# Patient Record
Sex: Female | Born: 1935 | Race: White | Hispanic: No | Smoking: Never smoker
Health system: Southern US, Community
[De-identification: ages and names within clinical notes are randomized; demographics above are authoritative.]

## PROBLEM LIST (undated history)

## (undated) DIAGNOSIS — E039 Hypothyroidism, unspecified: Secondary | ICD-10-CM

## (undated) DIAGNOSIS — G629 Polyneuropathy, unspecified: Secondary | ICD-10-CM

## (undated) DIAGNOSIS — I10 Essential (primary) hypertension: Secondary | ICD-10-CM

## (undated) DIAGNOSIS — I872 Venous insufficiency (chronic) (peripheral): Secondary | ICD-10-CM

## (undated) DIAGNOSIS — K219 Gastro-esophageal reflux disease without esophagitis: Secondary | ICD-10-CM

## (undated) DIAGNOSIS — M8580 Other specified disorders of bone density and structure, unspecified site: Secondary | ICD-10-CM

## (undated) DIAGNOSIS — F419 Anxiety disorder, unspecified: Secondary | ICD-10-CM

## (undated) DIAGNOSIS — R7881 Bacteremia: Secondary | ICD-10-CM

## (undated) DIAGNOSIS — I447 Left bundle-branch block, unspecified: Secondary | ICD-10-CM

## (undated) HISTORY — DX: Hypothyroidism, unspecified: E03.9

## (undated) HISTORY — PX: BLADDER SURGERY: SHX569

## (undated) HISTORY — PX: ABDOMINAL HYSTERECTOMY: SHX81

---
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
## 1997-12-07 ENCOUNTER — Other Ambulatory Visit: Admission: RE | Admit: 1997-12-07 | Discharge: 1997-12-07 | Payer: Self-pay | Admitting: Geriatric Medicine

## 1999-07-13 ENCOUNTER — Other Ambulatory Visit: Admission: RE | Admit: 1999-07-13 | Discharge: 1999-07-13 | Payer: Self-pay | Admitting: Obstetrics and Gynecology

## 2001-03-12 ENCOUNTER — Ambulatory Visit (HOSPITAL_COMMUNITY): Admission: RE | Admit: 2001-03-12 | Discharge: 2001-03-12 | Payer: Self-pay | Admitting: Gastroenterology

## 2001-05-22 ENCOUNTER — Ambulatory Visit (HOSPITAL_COMMUNITY): Admission: RE | Admit: 2001-05-22 | Discharge: 2001-05-22 | Payer: Self-pay | Admitting: Geriatric Medicine

## 2001-05-22 ENCOUNTER — Encounter: Payer: Self-pay | Admitting: Geriatric Medicine

## 2003-01-27 ENCOUNTER — Other Ambulatory Visit: Admission: RE | Admit: 2003-01-27 | Discharge: 2003-01-27 | Payer: Self-pay | Admitting: Obstetrics & Gynecology

## 2003-05-19 ENCOUNTER — Other Ambulatory Visit: Admission: RE | Admit: 2003-05-19 | Discharge: 2003-05-19 | Payer: Self-pay | Admitting: Obstetrics & Gynecology

## 2003-08-24 ENCOUNTER — Other Ambulatory Visit: Admission: RE | Admit: 2003-08-24 | Discharge: 2003-08-24 | Payer: Self-pay | Admitting: Obstetrics & Gynecology

## 2004-03-03 ENCOUNTER — Other Ambulatory Visit: Admission: RE | Admit: 2004-03-03 | Discharge: 2004-03-03 | Payer: Self-pay | Admitting: Obstetrics & Gynecology

## 2004-09-06 ENCOUNTER — Other Ambulatory Visit: Admission: RE | Admit: 2004-09-06 | Discharge: 2004-09-06 | Payer: Self-pay | Admitting: Obstetrics & Gynecology

## 2005-04-06 ENCOUNTER — Other Ambulatory Visit: Admission: RE | Admit: 2005-04-06 | Discharge: 2005-04-06 | Payer: Self-pay | Admitting: Obstetrics & Gynecology

## 2005-08-03 ENCOUNTER — Ambulatory Visit (HOSPITAL_COMMUNITY): Admission: RE | Admit: 2005-08-03 | Discharge: 2005-08-03 | Payer: Self-pay | Admitting: Interventional Cardiology

## 2006-07-02 ENCOUNTER — Encounter: Admission: RE | Admit: 2006-07-02 | Discharge: 2006-07-02 | Payer: Self-pay | Admitting: Geriatric Medicine

## 2006-07-02 IMAGING — US US ABDOMEN COMPLETE
1 series · 6 of 6 positions shown · non-contrast
Comparison: None.

ABDOMEN ULTRASOUND:

CLINICAL DATA: Right upper quadrant pain
TECHNIQUE: Complete abdominal ultrasound examination was performed including
evaluation of the liver, gallbladder, bile ducts, pancreas, kidneys, spleen,
IVC, and abdominal aorta.

[Series 1: us abdomen complete · 6 of 6 slices shown]
[im 1/6]
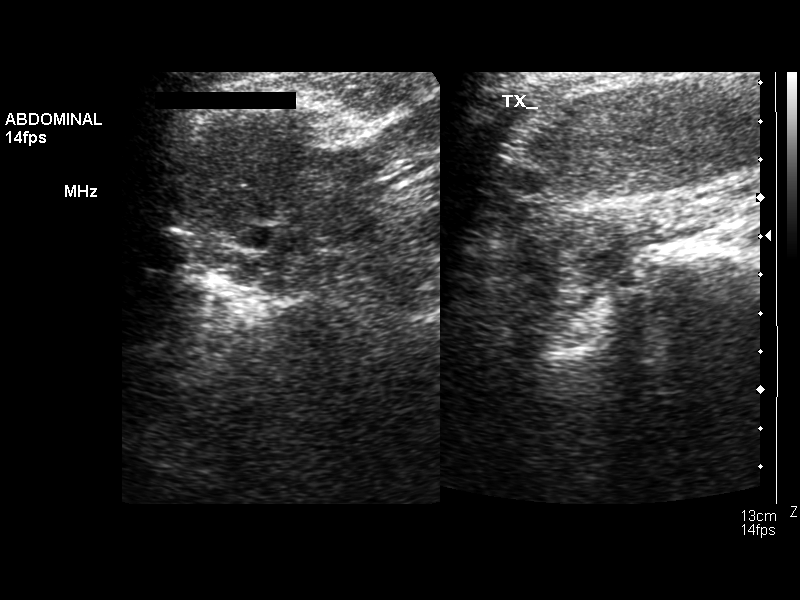
[im 2/6]
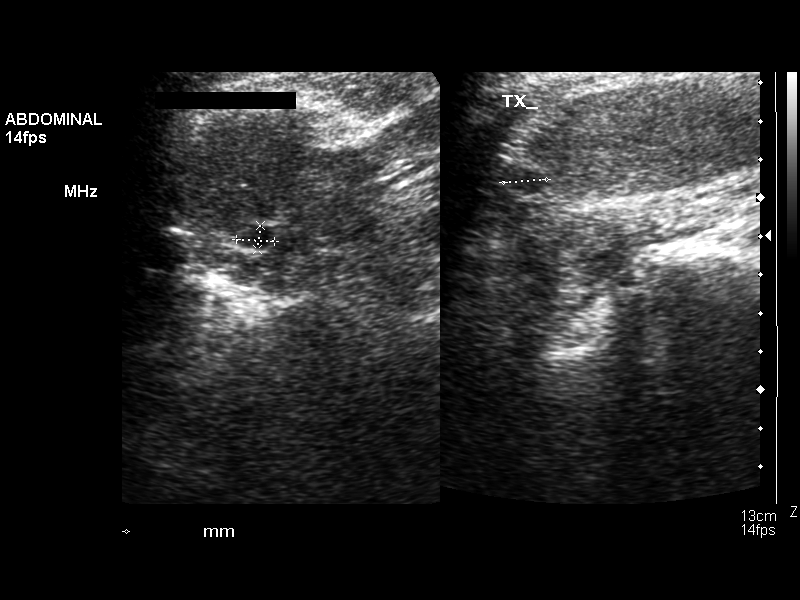
[im 3/6]
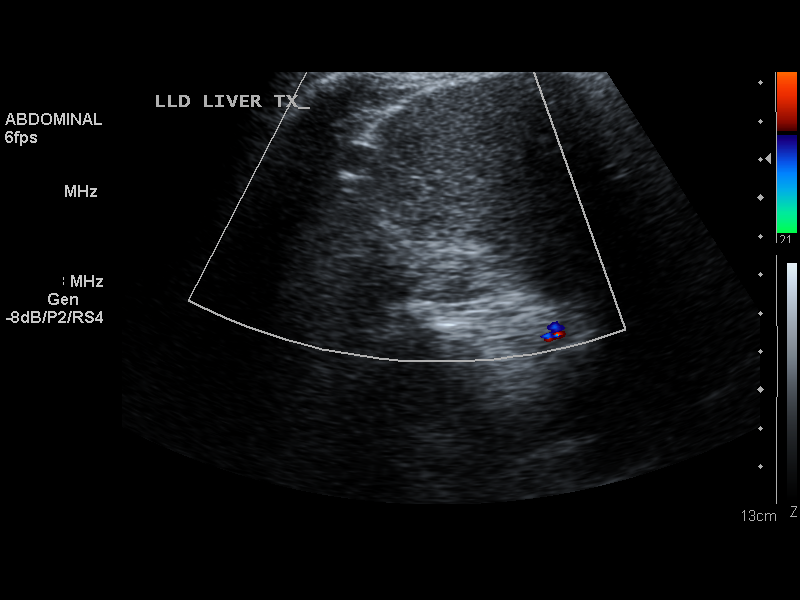
[im 4/6]
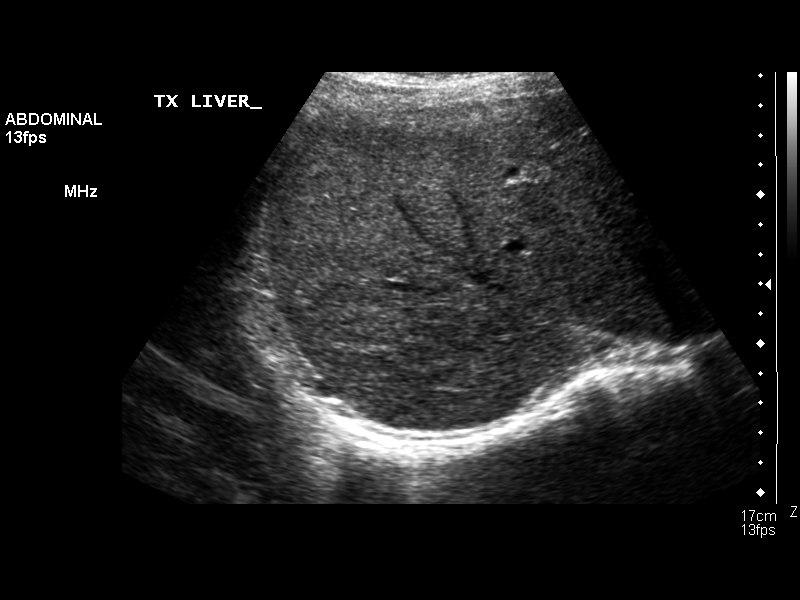
[im 5/6]
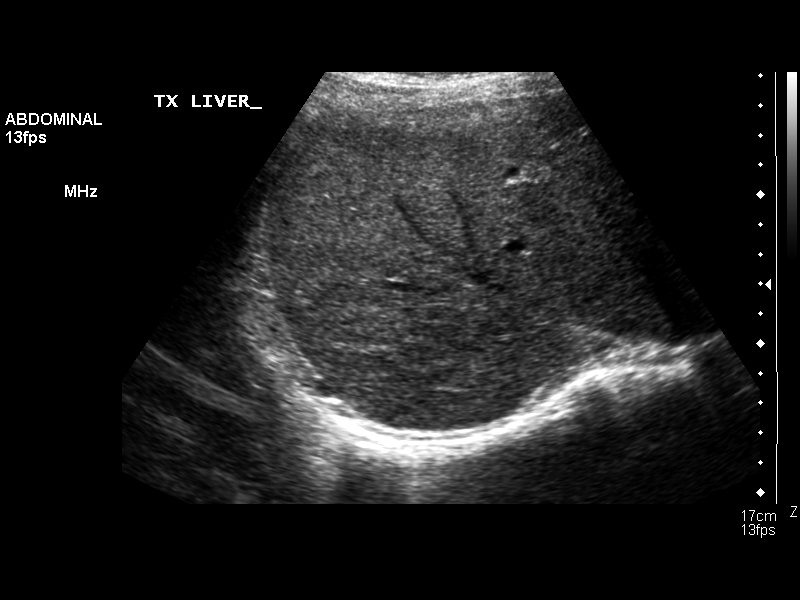
[im 6/6]
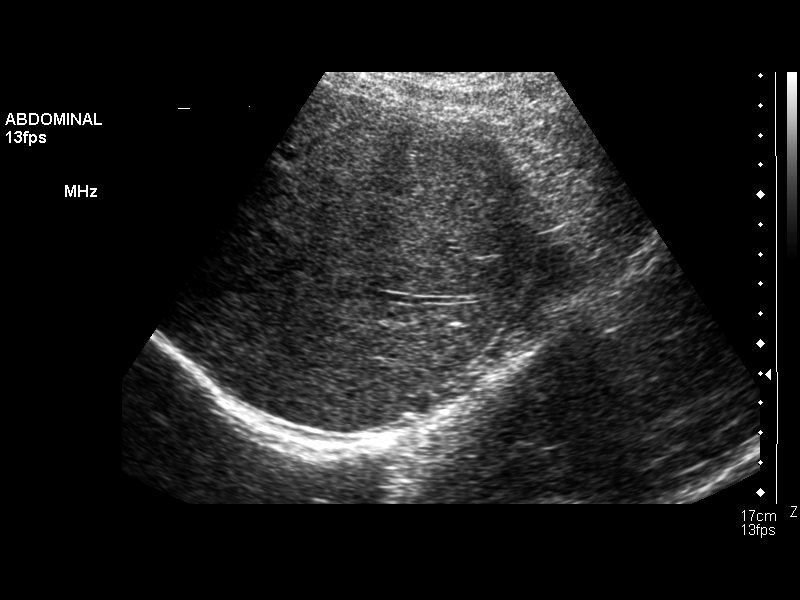

[6 of 6 positions shown; findings below may reference images not displayed]

FINDINGS: Gallbladder: Normal. Specifically, there is no evidence for gallstones,
gallbladder wall thickening or pericholecystic fluid.

Common Bile Duct:  Nondilated

Liver:  Tiny 1 cm subcapsular cyst. Otherwise unremarkable.

Inferior Vena Cava:  Normal

Pancreas:  Normal

Spleen:  Normal

Right Kidney:  11.0 cm in long axis.  Mild fullness of the intrarenal collecting
system.

Left Kidney:  12.7 cm in long axis.  4.8 cm cyst is identified in the upper pole
of the right kidney.

Aorta:  No aneurysm
IMPRESSION: Tiny hepatic cyst.

Mild fullness of the right intrarenal collecting system with a 4.8 cm cyst in
the upper pole of left kidney.

No evidence for cholelithiasis.

## 2006-07-02 IMAGING — US US ABDOMEN COMPLETE
1 series · 14 of 25 positions shown · non-contrast
Comparison: None.

ABDOMEN ULTRASOUND:

CLINICAL DATA: Right upper quadrant pain
TECHNIQUE: Complete abdominal ultrasound examination was performed including
evaluation of the liver, gallbladder, bile ducts, pancreas, kidneys, spleen,
IVC, and abdominal aorta.

[Series 1: us abdomen complete · 0.29mm/px · 14 of 76 slices shown]
[im 1/76]
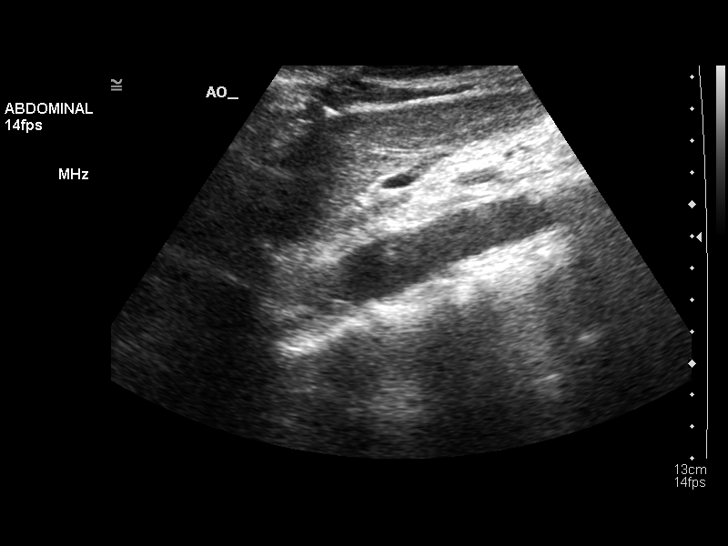
[im 7/76]
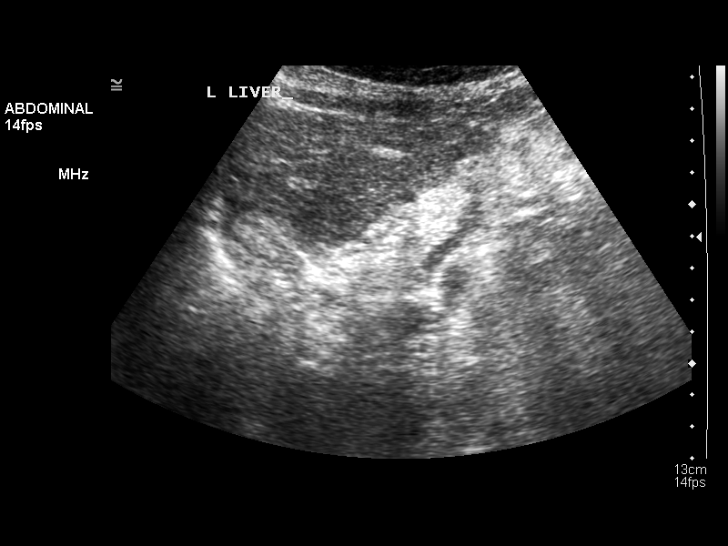
[im 13/76]
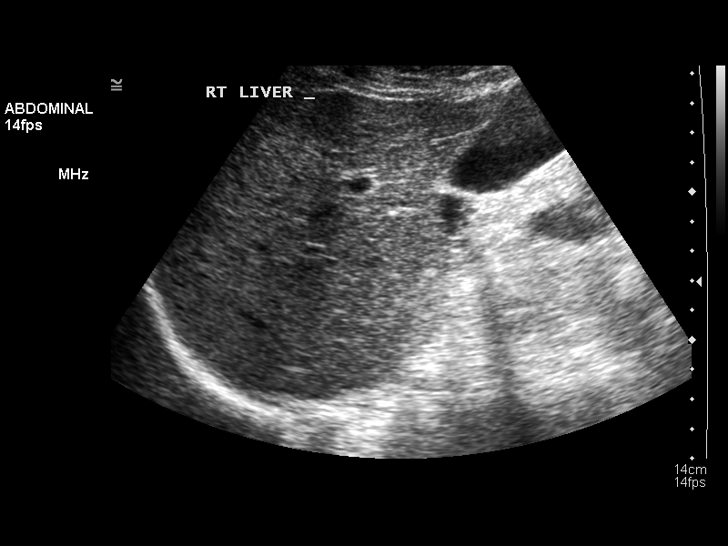
[im 19/76]
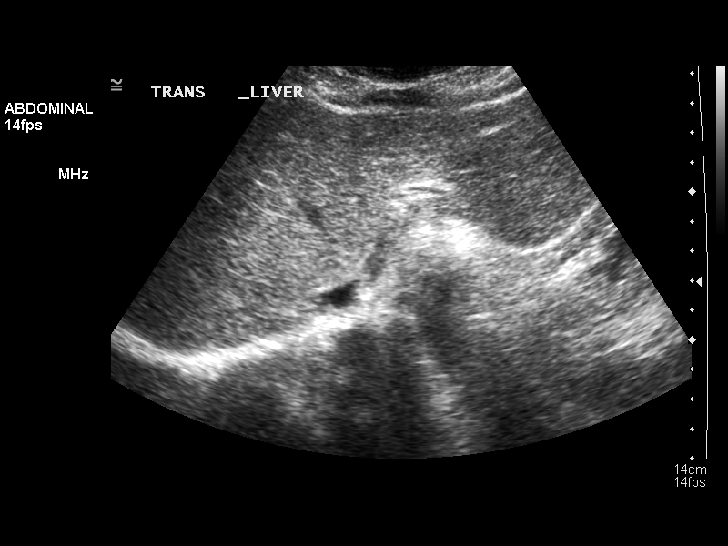
[im 26/76]
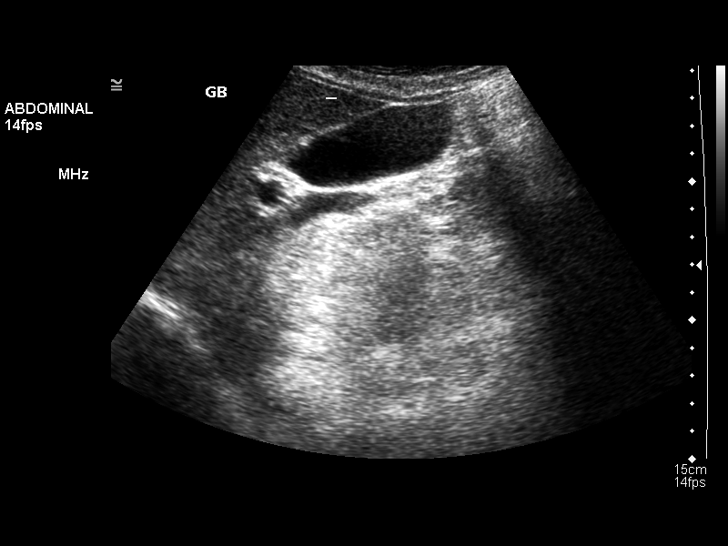
[im 29/76]
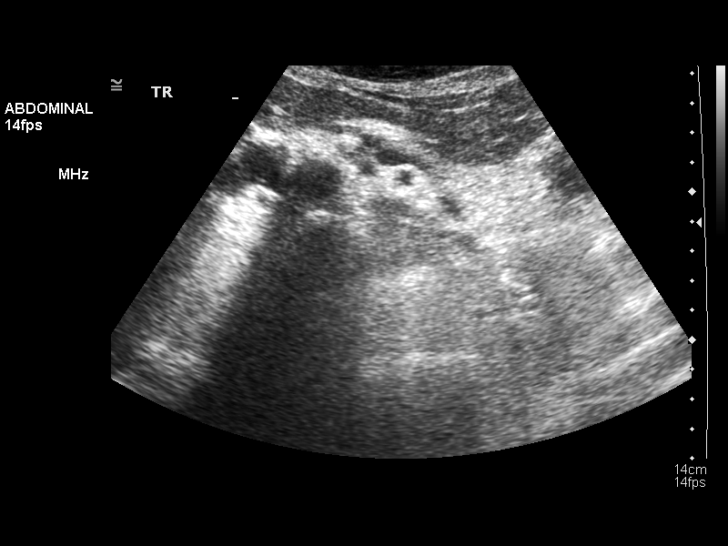
[im 35/76]
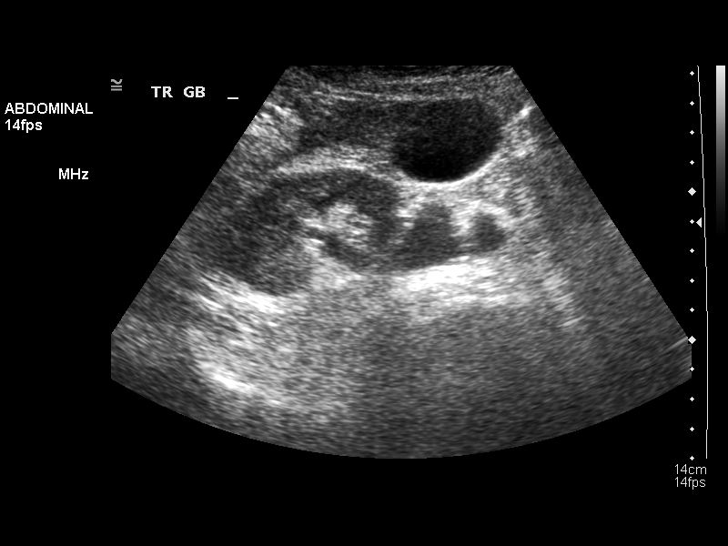
[im 41/76]
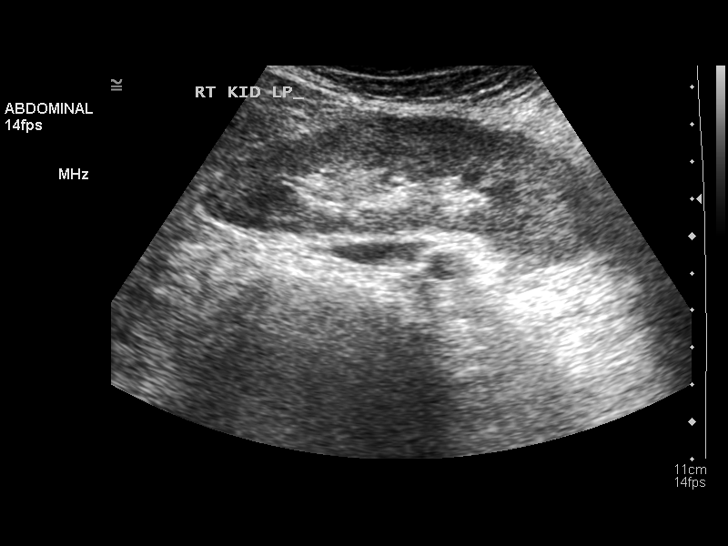
[im 47/76]
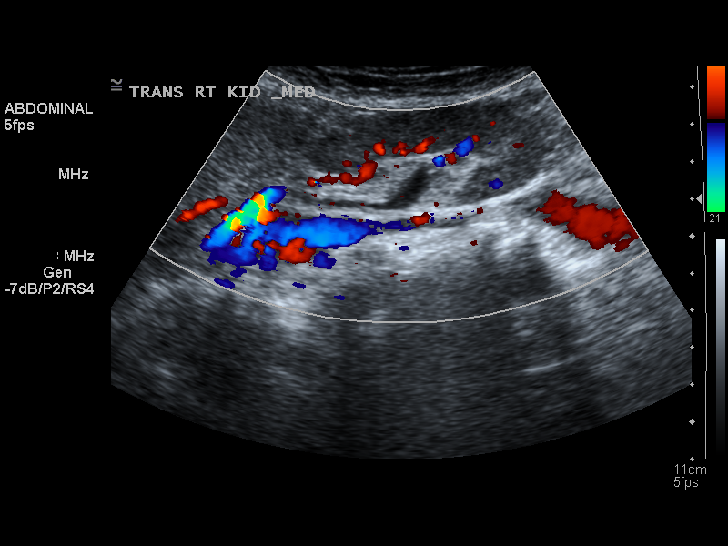
[im 51/76]
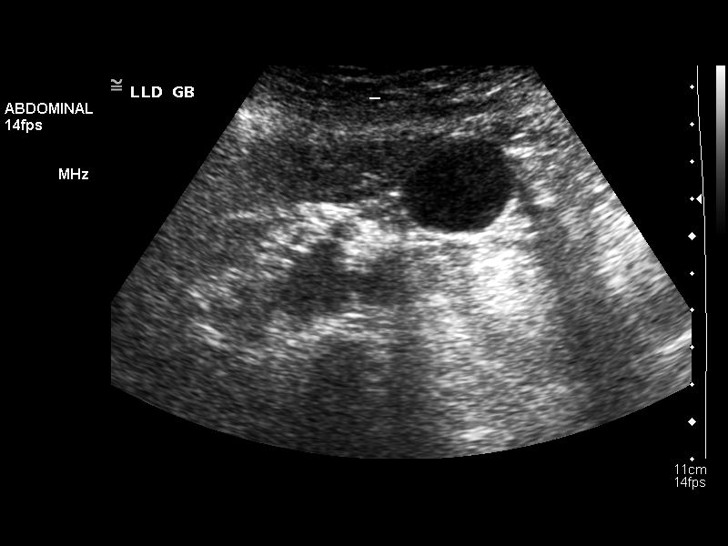
[im 57/76]
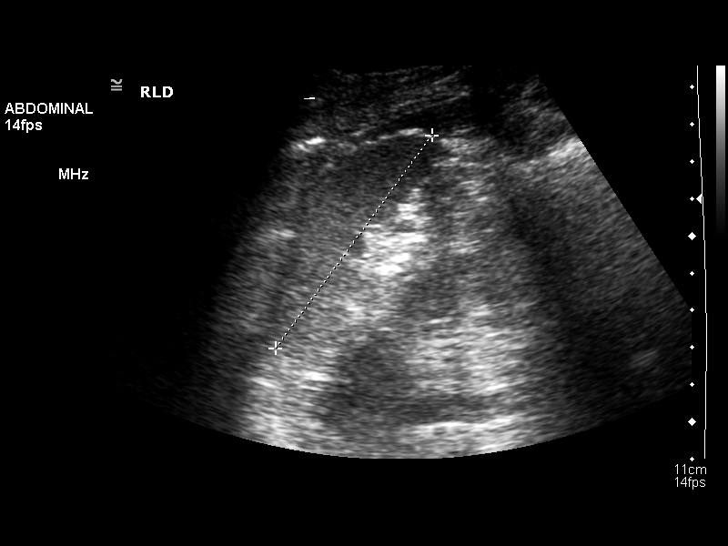
[im 63/76]
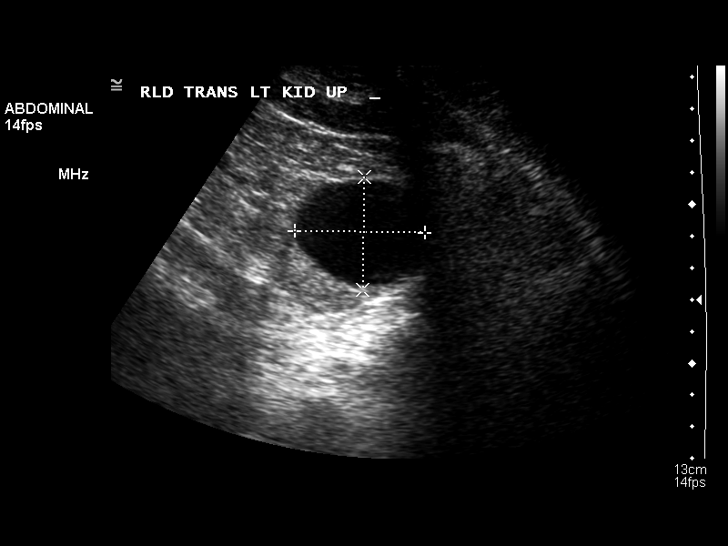
[im 69/76]
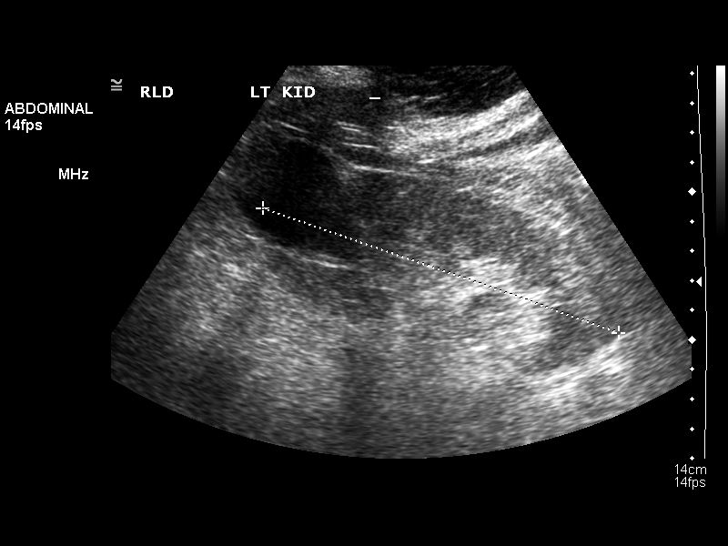
[im 76/76]
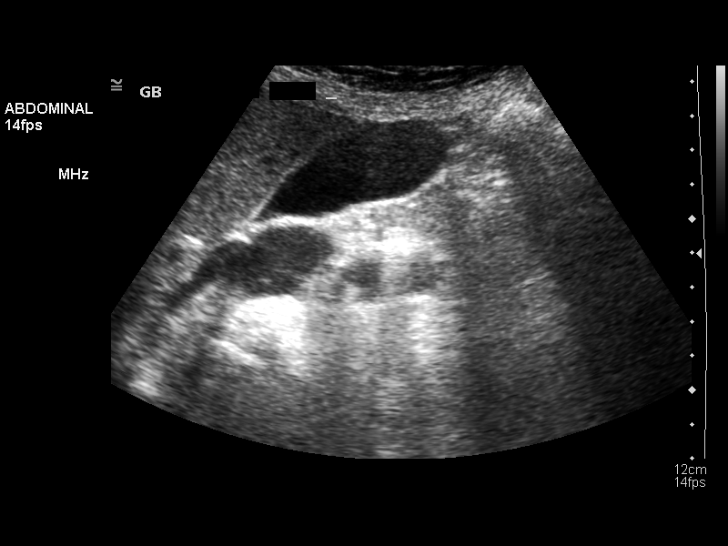

[14 of 25 positions shown; findings below may reference images not displayed]

FINDINGS: Gallbladder: Normal. Specifically, there is no evidence for gallstones,
gallbladder wall thickening or pericholecystic fluid.

Common Bile Duct:  Nondilated

Liver:  Tiny 1 cm subcapsular cyst. Otherwise unremarkable.

Inferior Vena Cava:  Normal

Pancreas:  Normal

Spleen:  Normal

Right Kidney:  11.0 cm in long axis.  Mild fullness of the intrarenal collecting
system.

Left Kidney:  12.7 cm in long axis.  4.8 cm cyst is identified in the upper pole
of the right kidney.

Aorta:  No aneurysm
IMPRESSION: Tiny hepatic cyst.

Mild fullness of the right intrarenal collecting system with a 4.8 cm cyst in
the upper pole of left kidney.

No evidence for cholelithiasis.

## 2008-09-09 ENCOUNTER — Encounter: Admission: RE | Admit: 2008-09-09 | Discharge: 2008-09-09 | Payer: Self-pay | Admitting: Geriatric Medicine

## 2008-09-09 IMAGING — CT CT PELVIS W/ CM
3 of 5 series · 13 of 36 positions shown, 19 images · IV contrast (READICAT/WATER & [ID] OMNI 300)
Comparison: None.

CT ABDOMEN

CLINICAL DATA: Abdominal pain and bloating.

CT ABDOMEN AND PELVIS WITH CONTRAST
TECHNIQUE: Multidetector CT imaging of the abdomen and pelvis was
performed using the standard protocol following bolus
administration of intravenous contrast.
Contrast: 100 ml [0K]

[Series 3: routine abdomen · axial · 0.88mm/px · z∈[-279,+1]mm · 6 of 80 slices shown, 11 images]
[im 12/80  soft-tissue]
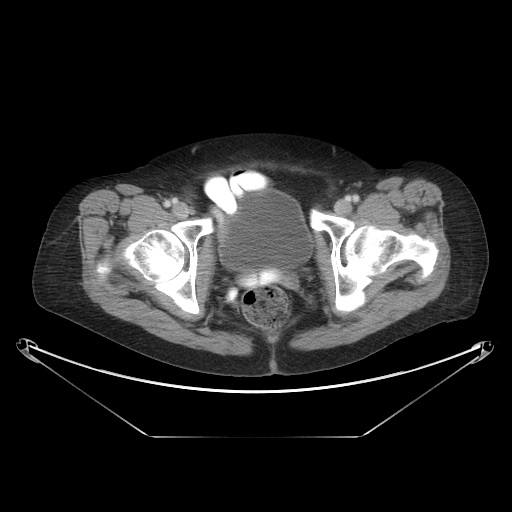
[im 12/80  bone]
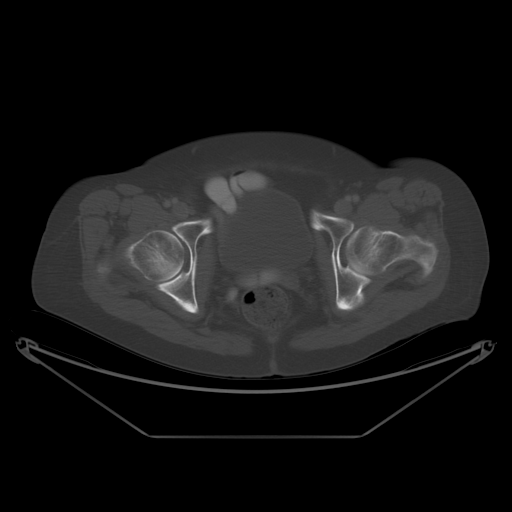
[im 23/80  soft-tissue]
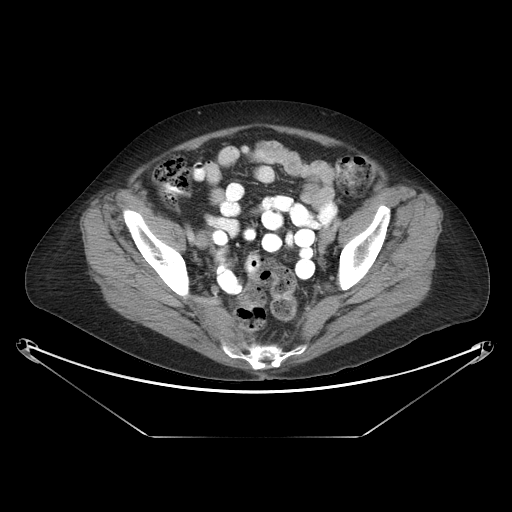
[im 34/80  soft-tissue]
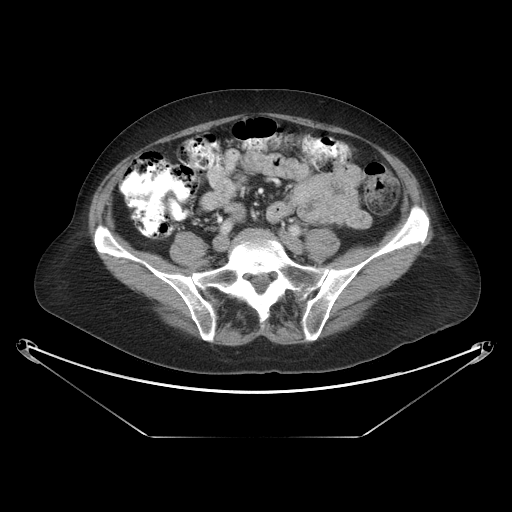
[im 34/80  lung]
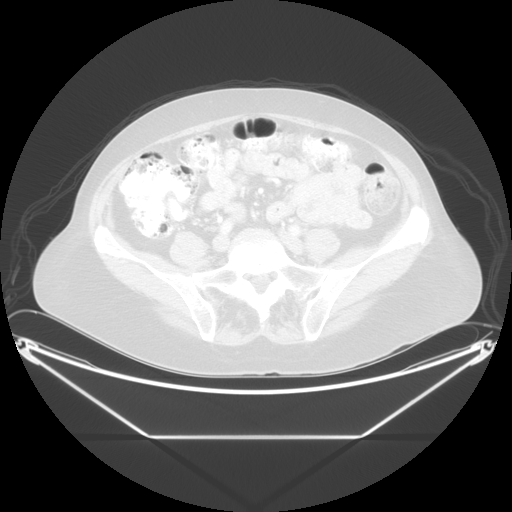
[im 46/80  soft-tissue]
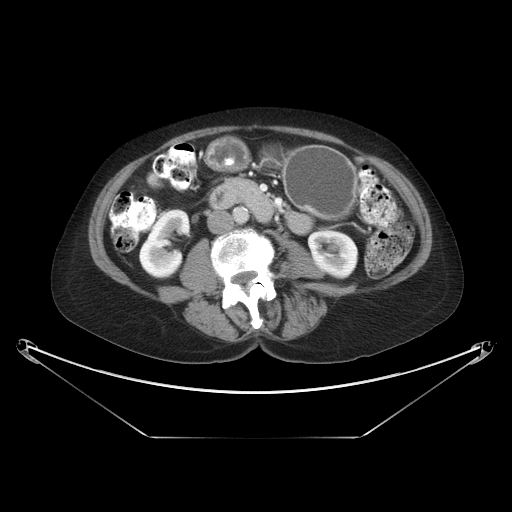
[im 46/80  lung]
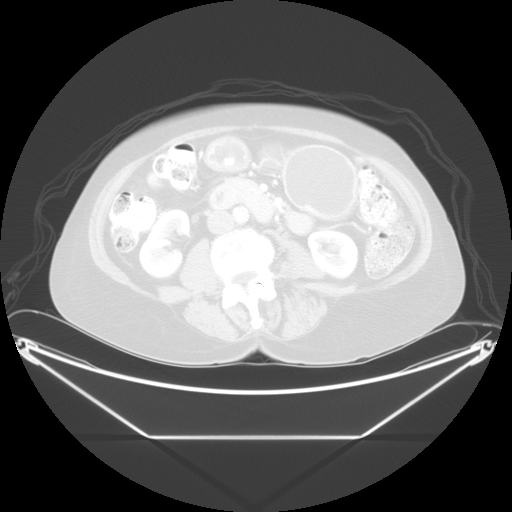
[im 57/80  soft-tissue]
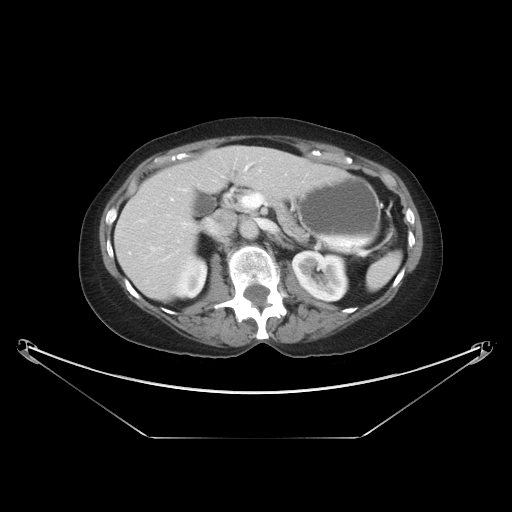
[im 57/80  lung]
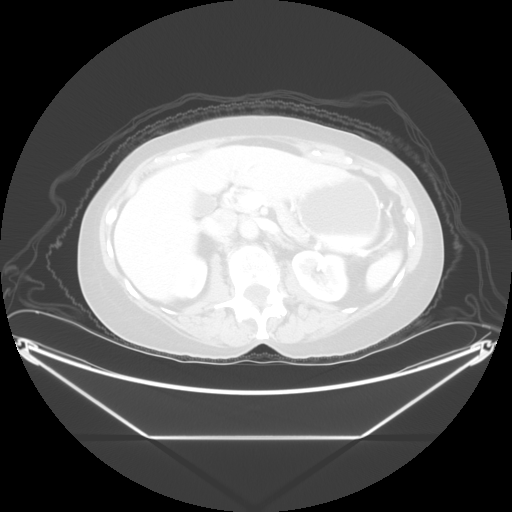
[im 68/80  soft-tissue]
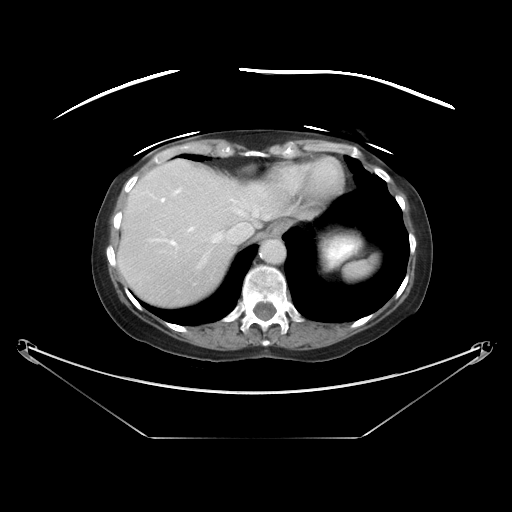
[im 68/80  lung]
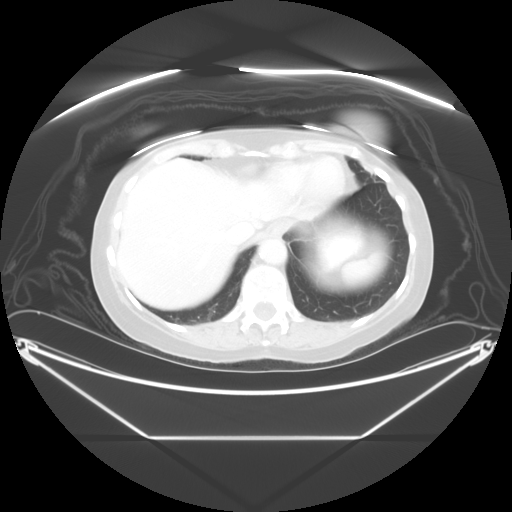

[Series 601: coronal body · coronal · 0.88mm/px · 1 of 125 slices shown, 2 images]
[im 42/125  soft-tissue]
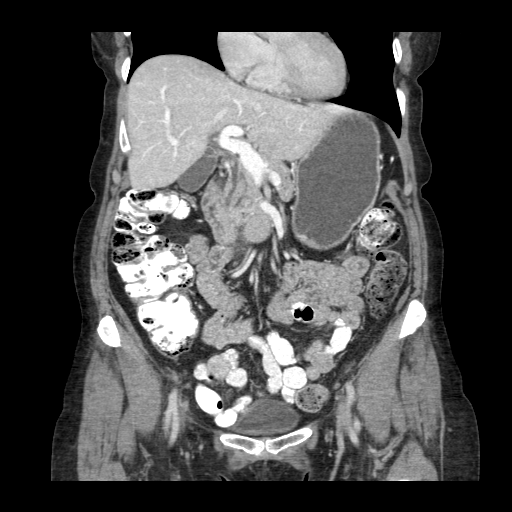
[im 42/125  bone]
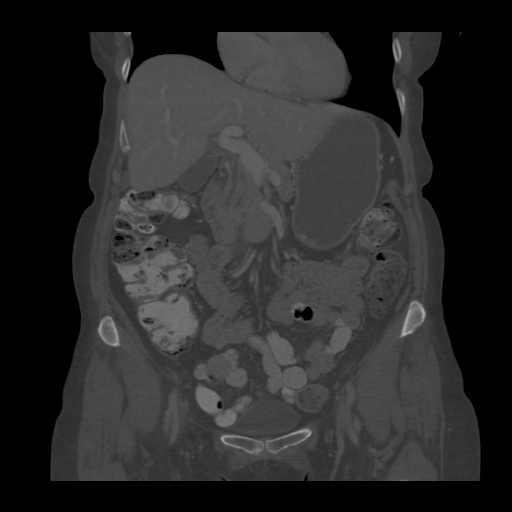

[Series 602: sagittal body · sagittal · 0.88mm/px · 6 of 170 slices shown]
[im 20/170  soft-tissue]
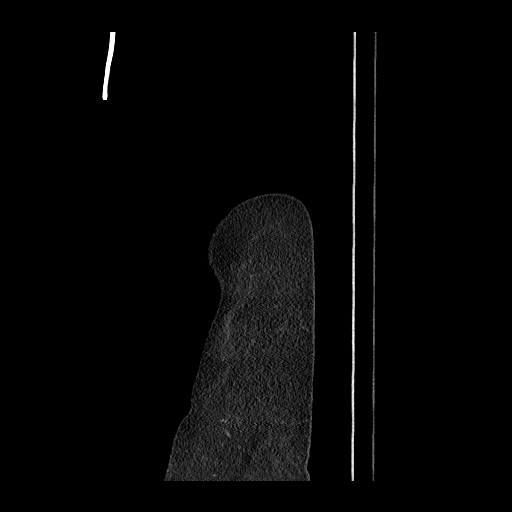
[im 40/170  soft-tissue]
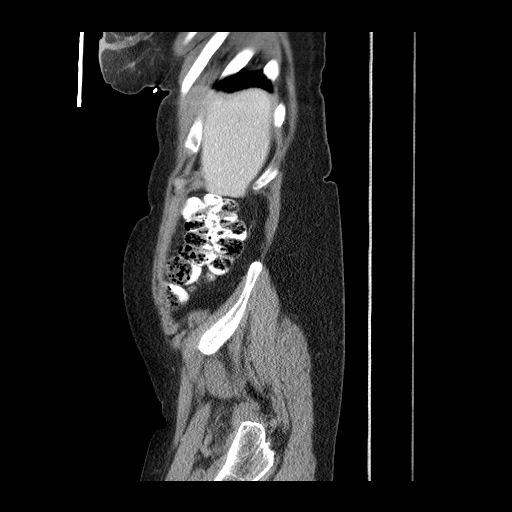
[im 60/170  soft-tissue]
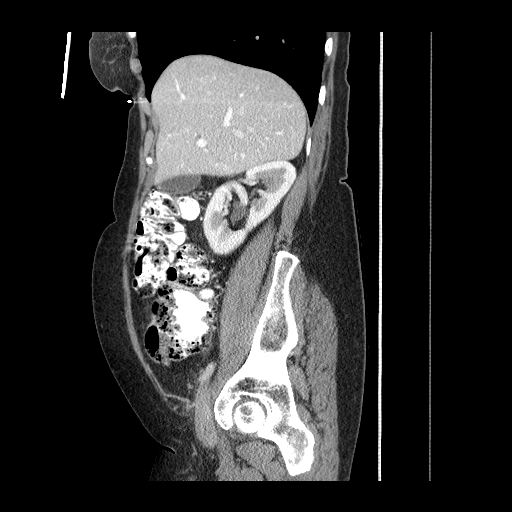
[im 80/170  soft-tissue]
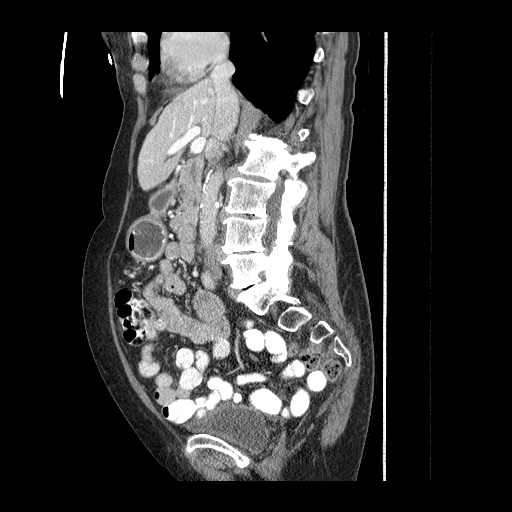
[im 100/170  soft-tissue]
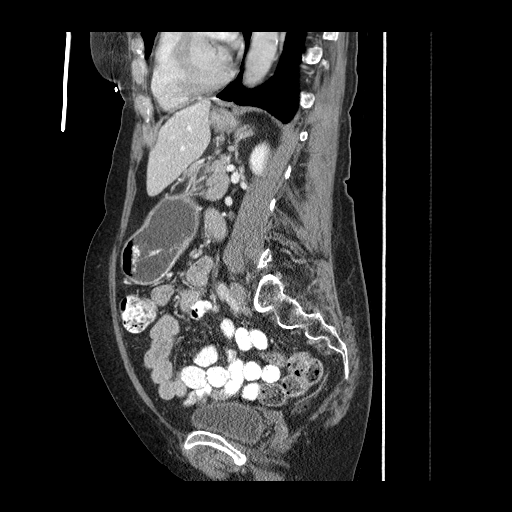
[im 120/170  soft-tissue]
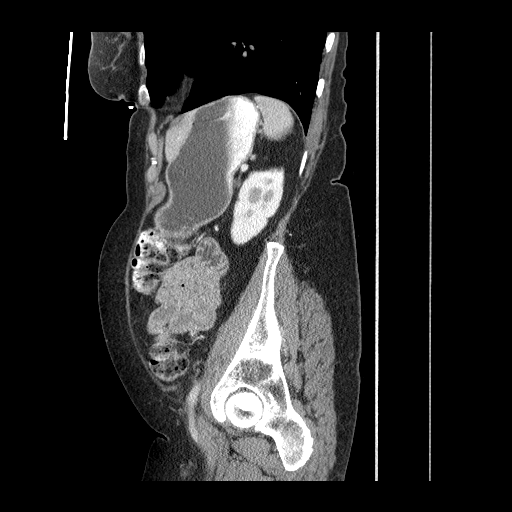

[13 of 36 positions shown; findings below may reference images not displayed]

FINDINGS: Lung bases show mild scarring and/or atelectasis.  Heart
size normal.  No pericardial or pleural effusion.

Liver, gallbladder, adrenal glands and right kidney are
unremarkable.  A 3.9 cm low attenuation lesion in the left kidney
is likely a cyst.  Spleen, pancreas, stomach and small bowel are
unremarkable.  No pathologically enlarged lymph nodes.  No free
fluid.
IMPRESSION: No acute findings in the abdomen.

CT PELVIS
FINDINGS: Colon and bladder are unremarkable.  Hysterectomy.
Ovaries are visualized.  No pathologically enlarged lymph nodes.
No free fluid.  No worrisome lytic or sclerotic lesions.
IMPRESSION: No acute findings in the pelvis.

## 2010-07-01 NOTE — Procedures (Signed)
[place]  Patient:    Autumn [name], Autumn [name] Visit Number: [id number] MRN: [id number]          Service Type: Attending:  Everardo All. Madilyn Fireman, M.D. Dictated by:   Everardo All Madilyn Fireman, M.D. Proc. Date: 03/12/01   CC:         Hal T. Stoneking, M.D.                           Procedure Report  PROCEDURE:  Colonoscopy.  INDICATION FOR PROCEDURE:  Screening colonoscopy in a 77 year old patient.  DESCRIPTION OF PROCEDURE:  The patient was placed in the left lateral decubitus position and placed on a pulse monitor with continuous low-flow oxygen delivered by nasal cannula.  She was sedated with 70 mg of IV Demerol and 7.5 mg IV Versed.  The Olympus video colonoscope was inserted into the rectum and advanced to the cecum, confirmed by transillumination at McBurneys point and visualization of the ileocecal valve and the appendiceal orifice. The prep was excellent.  The cecum, ascending, transverse, descending, and sigmoid colon all appeared normal.  Specifically, there were no diverticula, polyps, masses, or visible inflammatory changes.  The rectum likewise appeared normal and on retroflex view, the anus revealed no obviously enlarged internal hemorrhoids.  The colonoscope was then withdrawn and the patient returned to the recovery room in stable condition.  She tolerated the procedure well, and There were no intraoperative complications..  IMPRESSION:  Essentially normal colonoscopy.  PLAN:  Will recommend repeat flexible sigmoidoscopy with Hemoccult in five years. Dictated by:   Everardo All Madilyn Fireman, M.D. Attending:  Everardo All. Madilyn Fireman, M.D. DD:  03/12/01 TD:  03/12/01 Job: [id number]

## 2010-07-01 NOTE — Cardiovascular Report (Signed)
Autumn [name], Autumn [name]                 ACCOUNT NO.:  [id number]   MEDICAL RECORD NO.:  [id number]          PATIENT TYPE:  OIB   LOCATION:  2899                         FACILITY:  [facility]   PHYSICIAN:  Corky Crafts, MDDATE OF BIRTH:  03-05-1932   DATE OF PROCEDURE:  08/03/2005  DATE OF DISCHARGE:                              CARDIAC CATHETERIZATION   REFERRING PHYSICIAN:  Dr. Pete Glatter.   REASON FOR CATHETERIZATION:  Chest pain, abnormal stress test.   OPERATOR:  Dr. Eldridge Dace.   PROCEDURAL NARRATIVE:  The risks and benefits of cardiac catheterization  were explained to the patient, and informed consent was obtained.  The  patient was brought to the cath lab, placed on the table.  She was prepped  and draped in the usual sterile fashion.  Her right groin was inflated with  1% lidocaine, and a 6-French right femoral arterial sheath was placed using  modified Seldinger technique.  Right coronary artery angiography was  performed using a JR-4.5 catheter.  Catheter was advanced to the vessel  ostium under fluoroscopic guidance.  Digital angiography was performed in  multiple projections using hand injection of contrast.  Left coronary artery  was then performed using a JL-3.5 catheter.  The circumflex system was  visualized with this catheter.  The LAD system could not be seen.  Digital  angiography was performed in multiple projections using hand injection of  contrast.  Left ventriculogram was then performed using a pigtail catheter.  Catheter was advanced across the aortic valve under fluoroscopic guidance.  Power injection of contrast was performed in a 30-degree RAO position.  Catheter was pulled back under continuous hemodynamic monitoring.  Catheter  was pulled back to level the renal arteries.  A power injection of contrast  was performed to visualize the renal arteries and abdominal aorta.  Upon  review of the films, there was an apparently anomalous takeoff of the left  anterior descending off the right cusp.  A JR-4.0 catheter was advanced to  the right cusp but could not visualize the anomalous take off of the LAD.  A  JR-3.5 was then used with nonselective visualization of this vessel.  A  multipurpose A2 catheter was then used to successfully engaged one of the  branches that seemed to supply the LAD territory.  Both vessels were small  and appeared free of disease.   FINDINGS:  Circumflex is widely patent.  There is a large OM1 and a large  branching OM2 which are all angiographically normal.  The right coronary  artery is a large dominant vessel which is angiographically normal.  At the  origin of the right coronary artery, there are two small vessels coursing  through the LAD territory, both of which appear angiographically normal.  Left ventriculogram shows normal left ventricular function with an ejection  fraction of 55-60%.   HEMODYNAMICS:  Ventricular pressure 149/8, left ventricular end-diastolic  pressure 15 mmHg.  Aortic pressure 152/74, mean aortic pressure of 107 mmHg.  The abdominal aorta showed no aortic aneurysm, widely patent renal ostia  bilaterally.  The right kidney is supplied by  a single artery.  The left  kidney is also supplied by single artery which appears to branch early.  There is bilateral fibromuscular dysplasia noted in the mid portion of each  vessel.   IMPRESSION:  1.  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  2.  Normal left ventricular function with ejection fraction of 60%.  3.  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   RECOMMENDATIONS:  Continue medical therapy including blood pressure lowering  medicines given the renal artery stenosis.  Will start amlodipine. I did  explain to the patient that if her blood pressure could not be adequately  controlled with medicines, renal artery angioplasty would have be  considered. Sheath will be  removed using manual compression.      Corky Crafts, MD  Electronically Signed     JSV/MEDQ  D:  08/03/2005  T:  08/03/2005  Job:  [id number]

## 2012-06-25 ENCOUNTER — Other Ambulatory Visit (HOSPITAL_COMMUNITY): Payer: Self-pay | Admitting: Obstetrics & Gynecology

## 2012-06-25 DIAGNOSIS — N2889 Other specified disorders of kidney and ureter: Secondary | ICD-10-CM

## 2012-06-28 ENCOUNTER — Ambulatory Visit (HOSPITAL_COMMUNITY)
Admission: RE | Admit: 2012-06-28 | Discharge: 2012-06-28 | Disposition: A | Discharge status: Home / Self Care | Payer: Medicare Other | Source: Ambulatory Visit | Attending: Obstetrics & Gynecology | Admitting: Obstetrics & Gynecology

## 2012-06-28 DIAGNOSIS — Q619 Cystic kidney disease, unspecified: Secondary | ICD-10-CM | POA: Insufficient documentation

## 2012-06-28 DIAGNOSIS — N2889 Other specified disorders of kidney and ureter: Secondary | ICD-10-CM

## 2012-06-28 IMAGING — US US RENAL
1 series · 14 of 25 positions shown · non-contrast
Comparison: CT [DATE]

CLINICAL DATA: History of left kidney cyst. History of bladder
prolapse with surgery in [[facility]].

RENAL/URINARY TRACT ULTRASOUND COMPLETE

[Series 1: us renal · 14 of 43 slices shown]
[im 1/43]
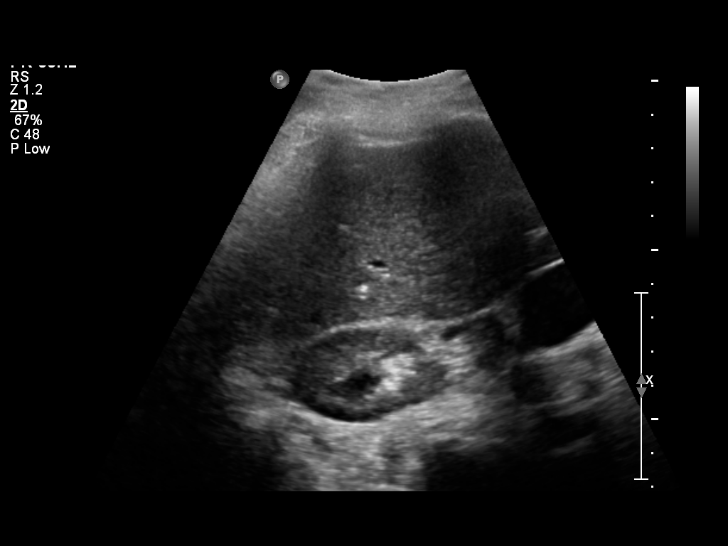
[im 4/43]
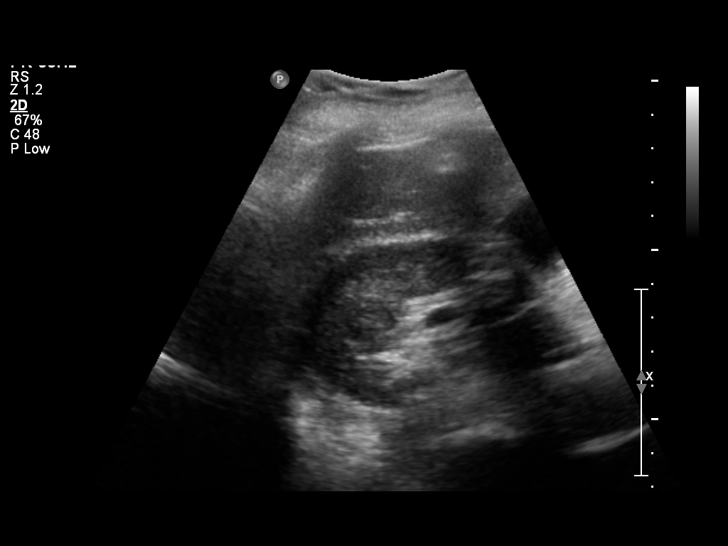
[im 8/43]
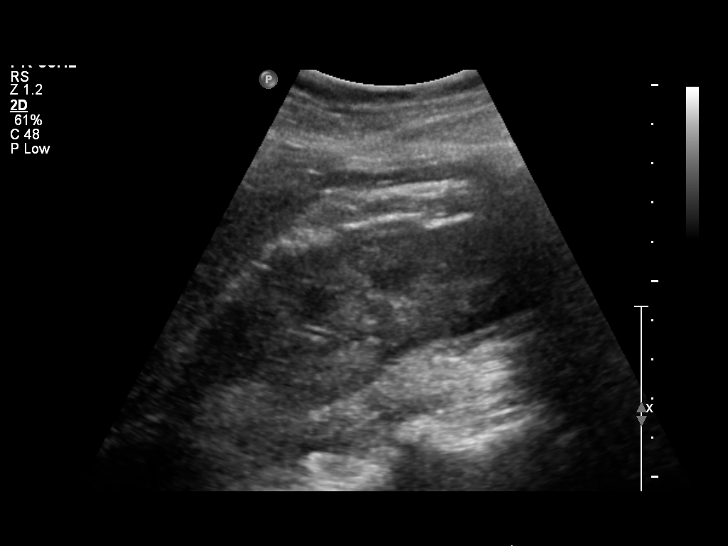
[im 11/43]
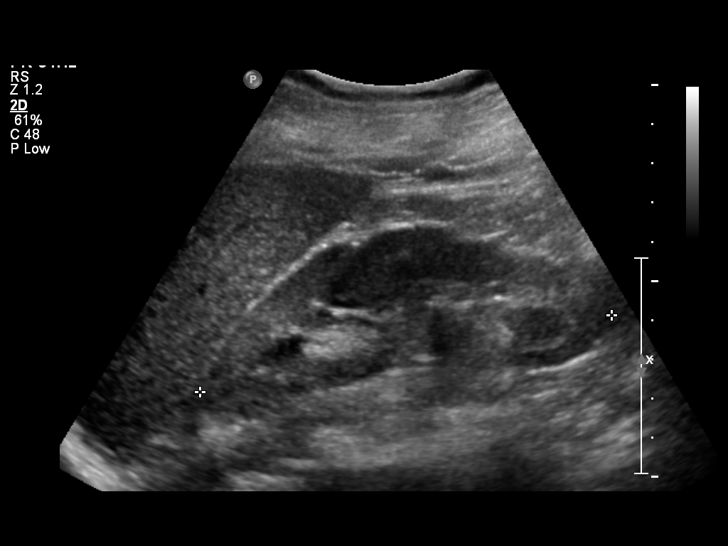
[im 15/43]
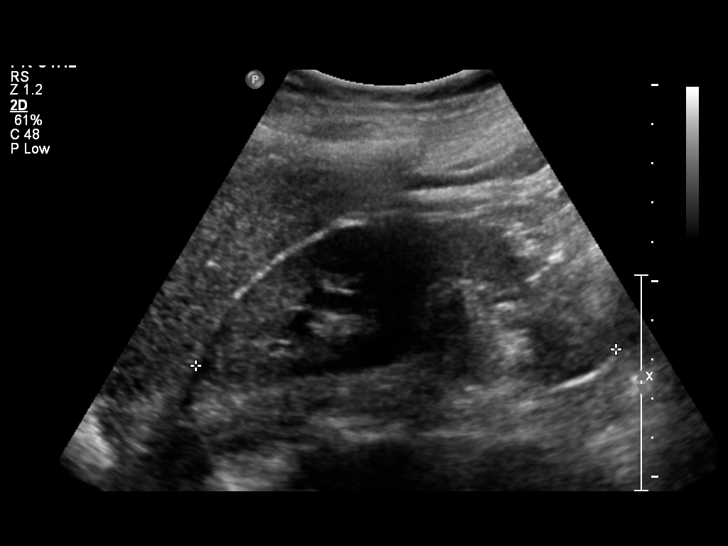
[im 16/43]
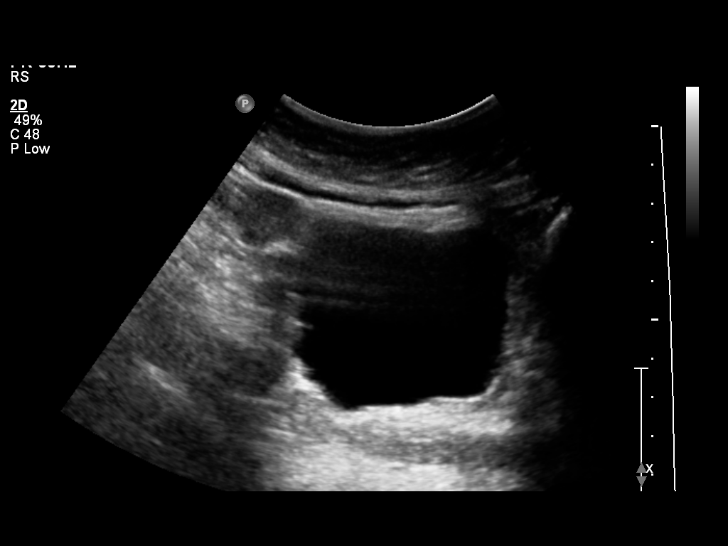
[im 20/43]
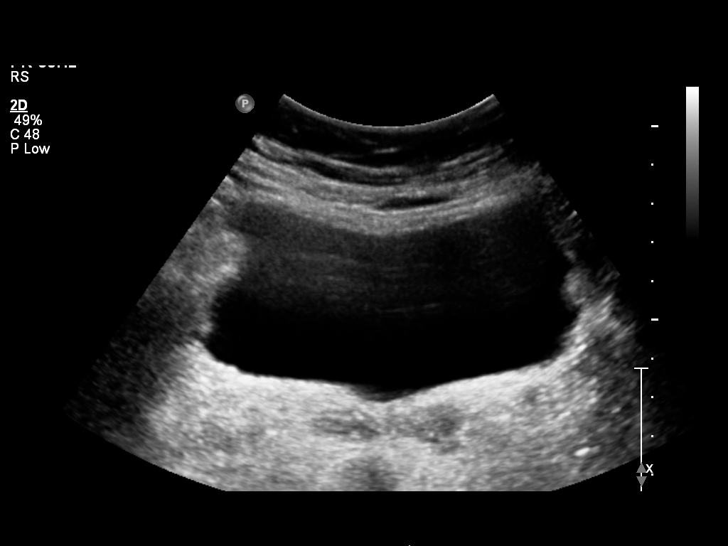
[im 23/43]
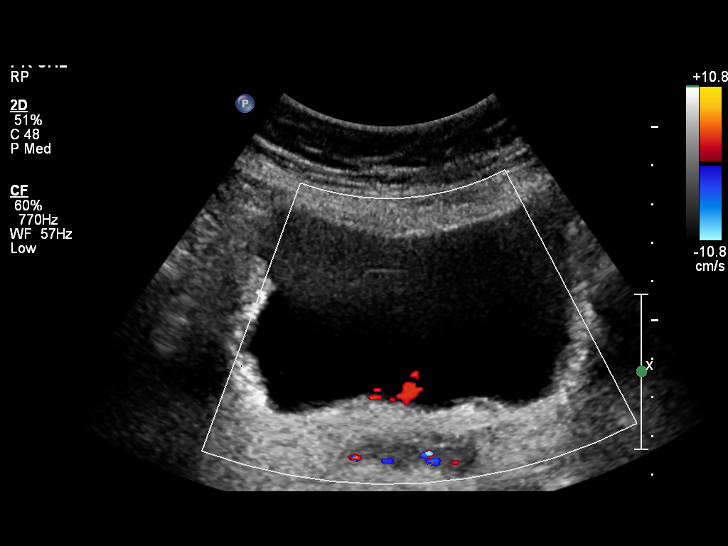
[im 27/43]
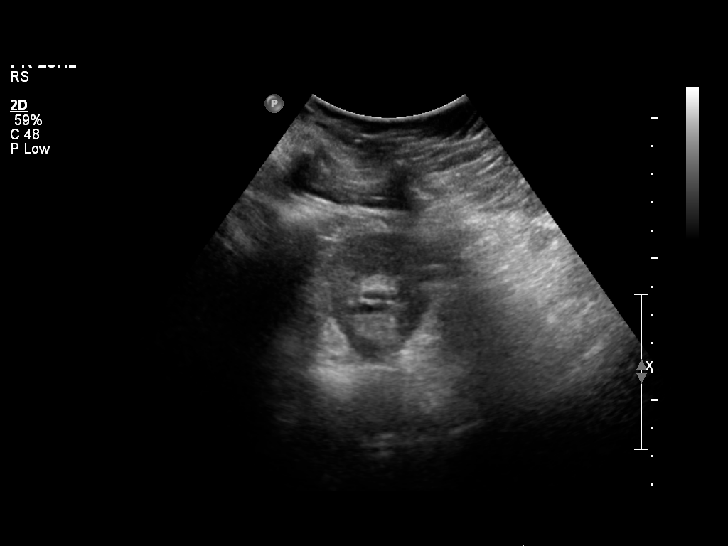
[im 29/43]
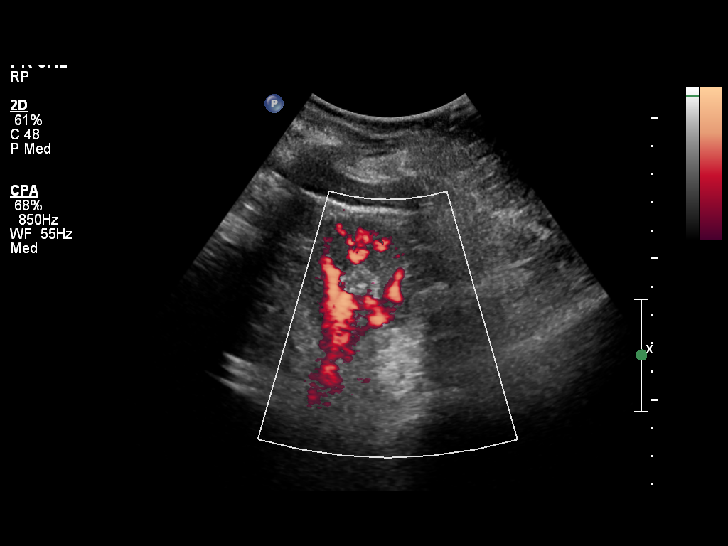
[im 32/43]
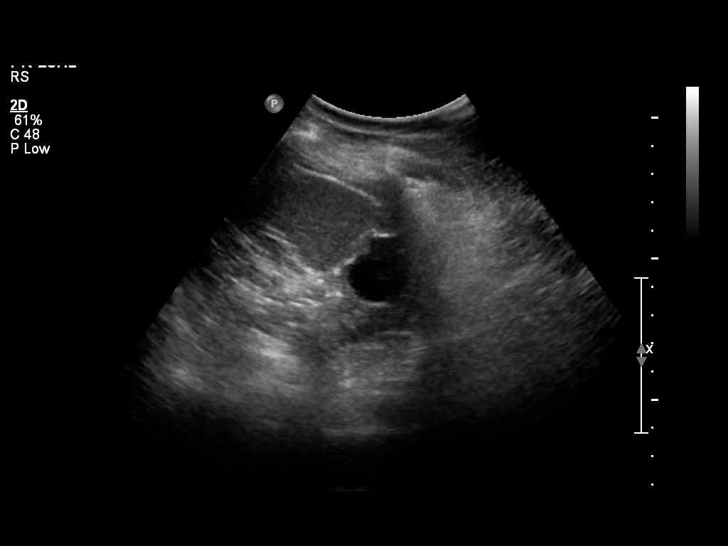
[im 36/43]
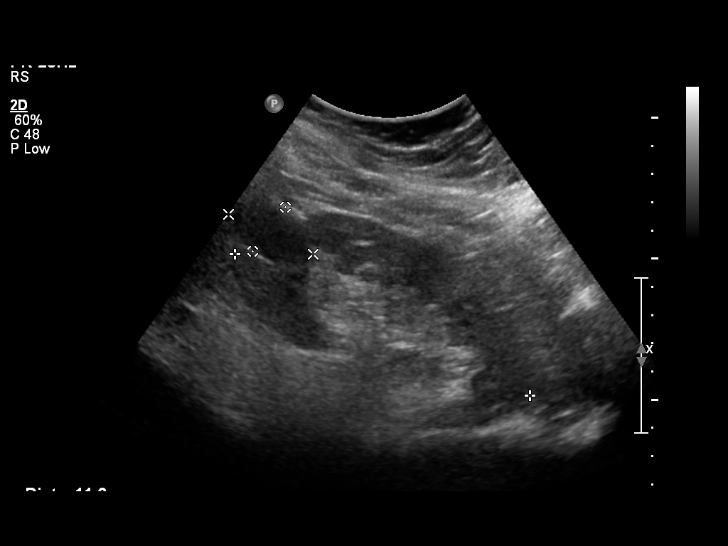
[im 39/43]
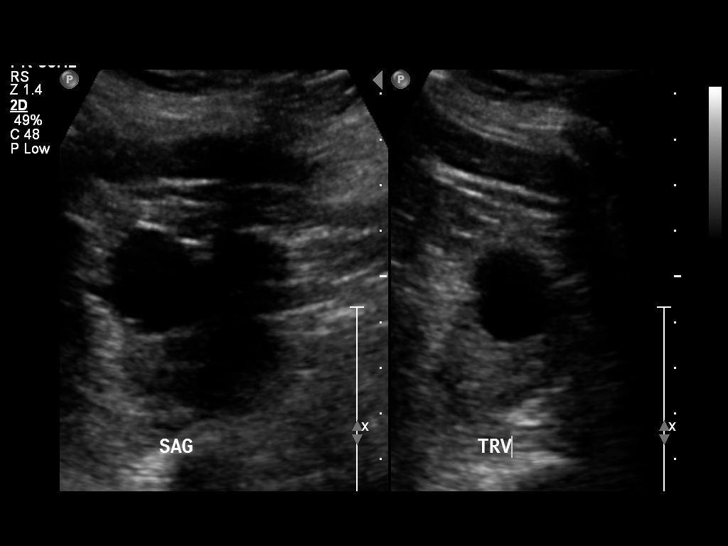
[im 43/43]
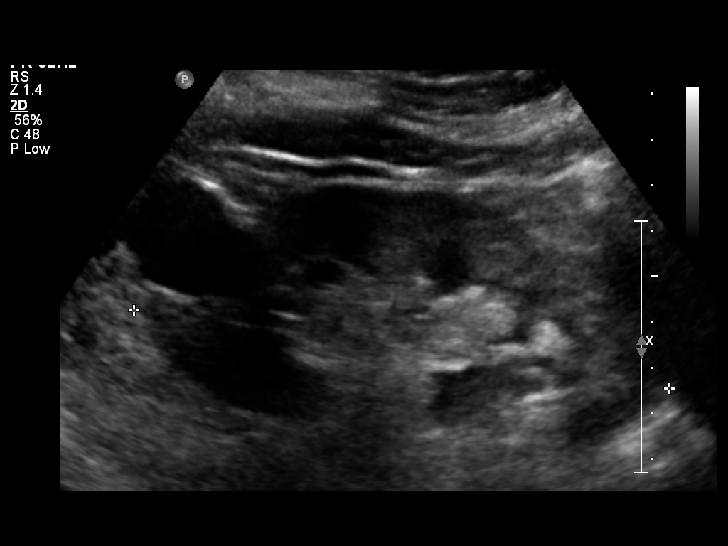

[14 of 25 positions shown; findings below may reference images not displayed]

FINDINGS: Right Kidney:  Demonstrates a sagittal length of 10.7 cm.  No focal
parenchymal abnormality or signs of hydronephrosis is evident

Left Kidney:  Demonstrates a sagittal length of 11.8 cm.  A thin
walled unilocular simple cyst is noted in the left upper pole
measuring 2.5 x 2.5 x 2.0 cm. This is smaller than on prior CT from
[Z[facility]]. No worrisome focal lesions or signs of hydronephrosis are
seen

Bladder:  Has a normal filled appearance.  Bilateral ureteral jets
are seen.
IMPRESSION: Simple left upper pole renal cyst.  Otherwise unremarkable exam

## 2013-05-29 ENCOUNTER — Ambulatory Visit
Admission: RE | Admit: 2013-05-29 | Discharge: 2013-05-29 | Disposition: A | Discharge status: Home / Self Care | Payer: Medicare Other | Source: Ambulatory Visit | Attending: Geriatric Medicine | Admitting: Geriatric Medicine

## 2013-05-29 ENCOUNTER — Other Ambulatory Visit: Payer: Self-pay | Admitting: Geriatric Medicine

## 2013-05-29 DIAGNOSIS — M542 Cervicalgia: Secondary | ICD-10-CM

## 2013-05-29 DIAGNOSIS — S0083XA Contusion of other part of head, initial encounter: Secondary | ICD-10-CM

## 2013-05-29 DIAGNOSIS — S0003XA Contusion of scalp, initial encounter: Secondary | ICD-10-CM

## 2013-05-29 DIAGNOSIS — S1093XA Contusion of unspecified part of neck, initial encounter: Secondary | ICD-10-CM

## 2013-05-29 IMAGING — CR DG CERVICAL SPINE COMPLETE 4+V
6 series · 6 of 6 positions shown · non-contrast
Comparison: None.

CLINICAL DATA: Fall this morning. Pain in posterior cervical spine.

EXAM:
CERVICAL SPINE  4+ VIEWS

[w c-spine lat]
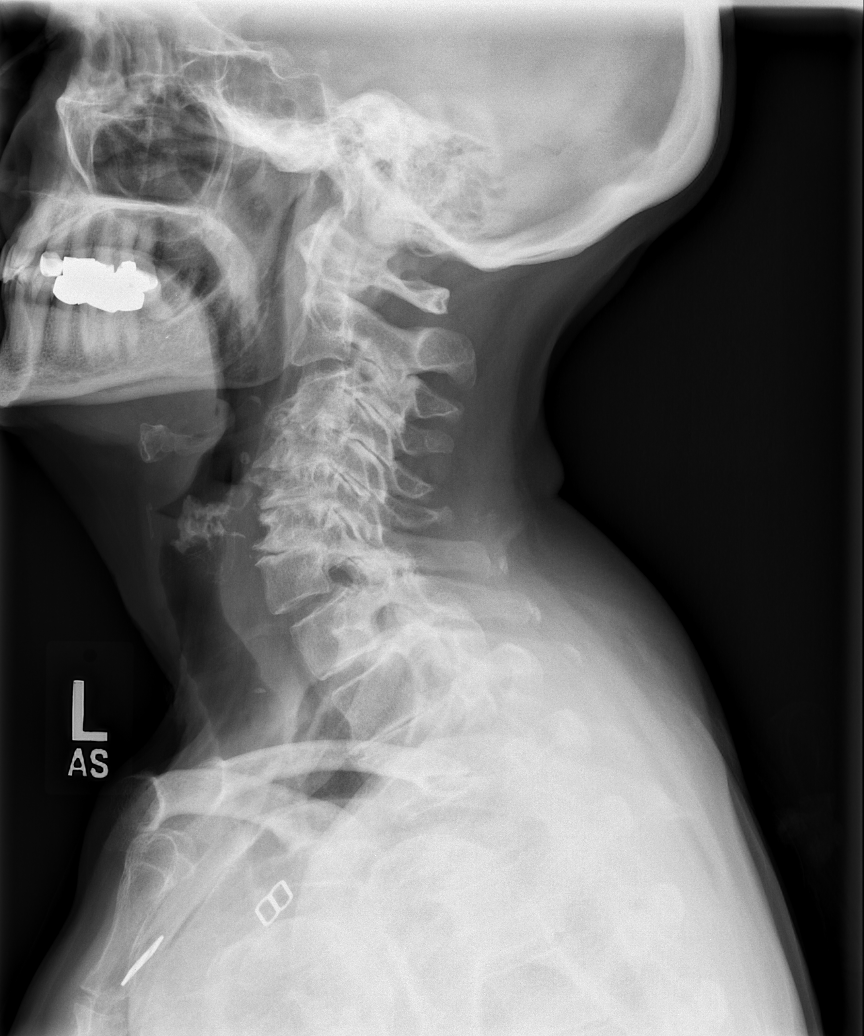

[w c-spine oblique (1 of 2)]
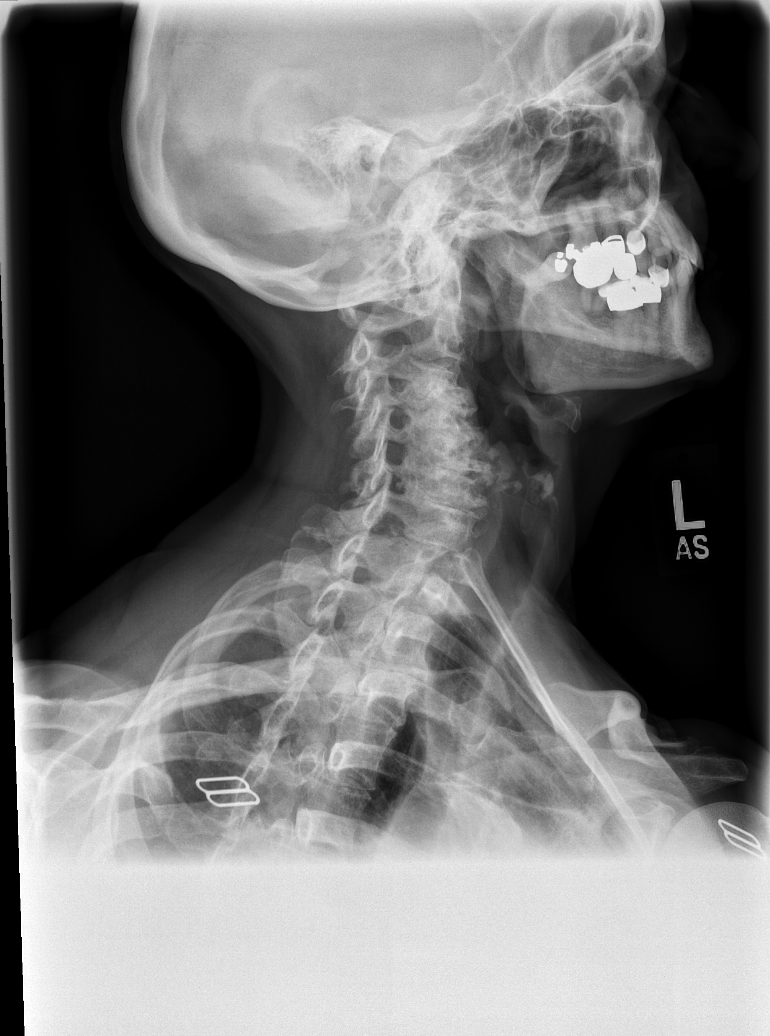

[w c-spine oblique (2 of 2)]
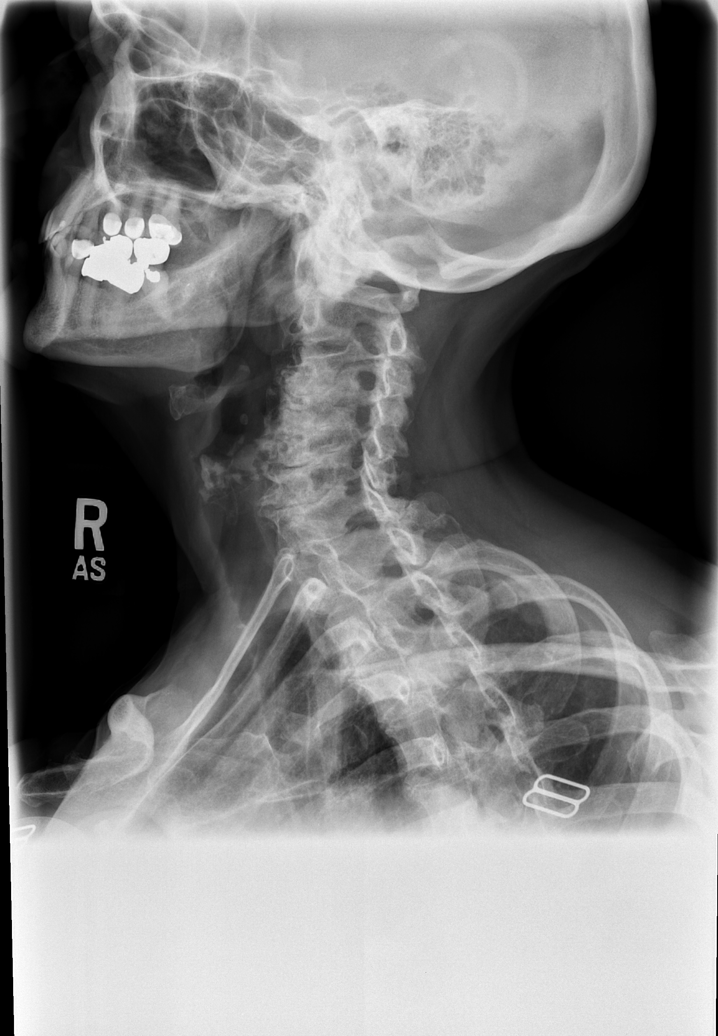

[w c-spine a.p. *]
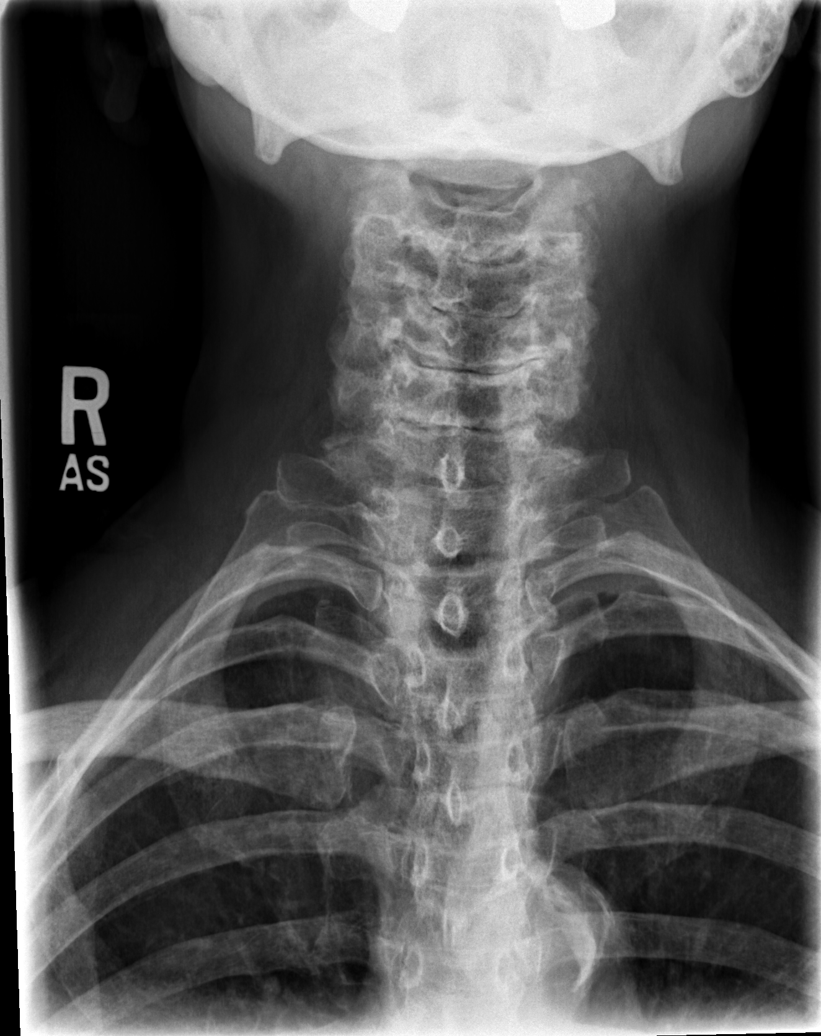

[w c-spine odontoid * (1 of 2)]
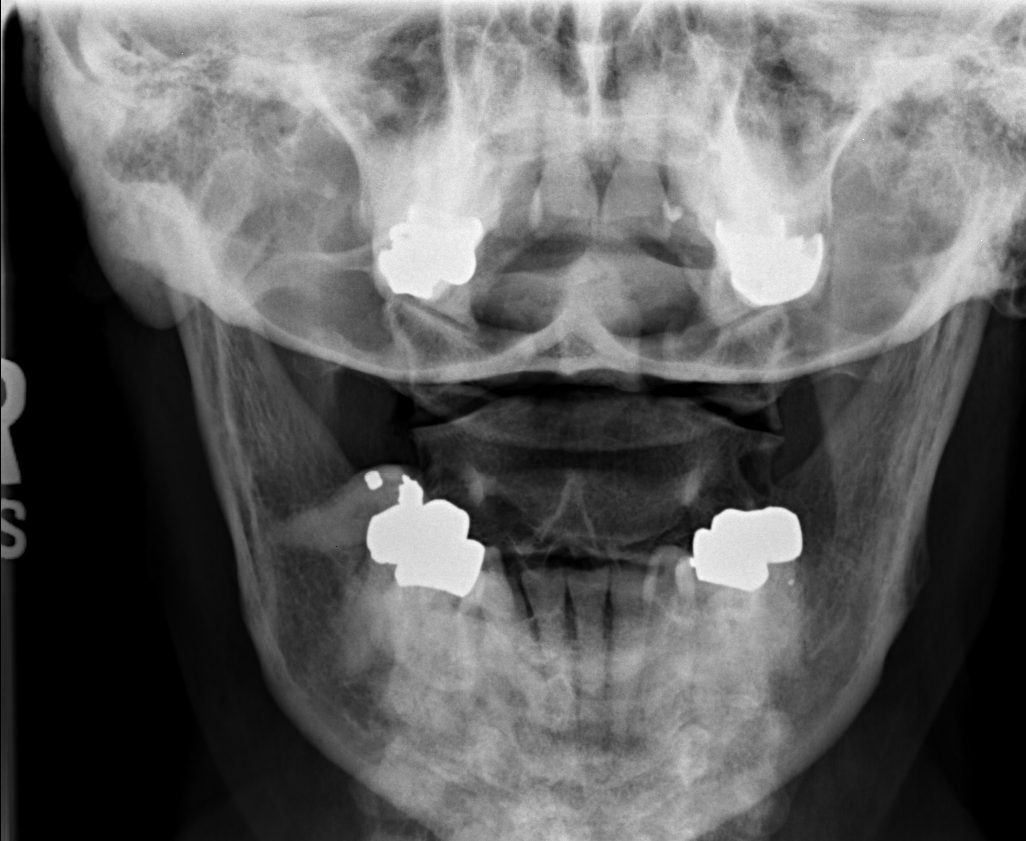

[w c-spine odontoid * (2 of 2)]
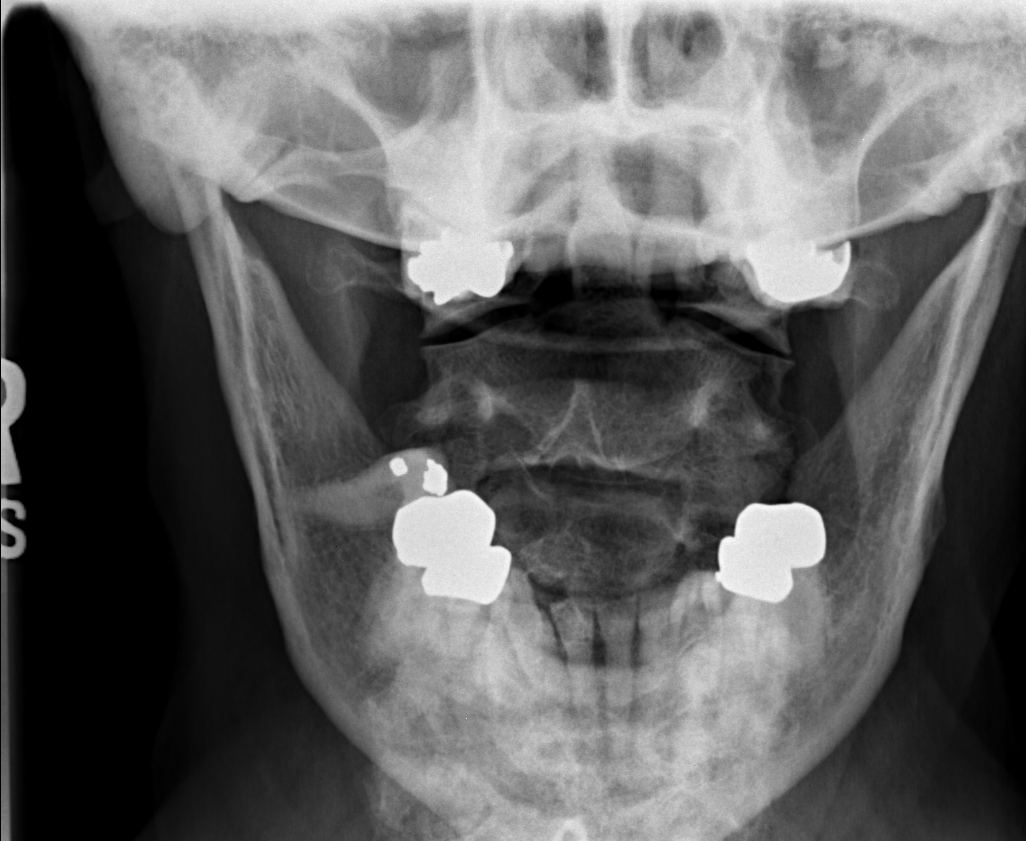

[6 of 6 positions shown; findings below may reference images not displayed]

FINDINGS: The lateral view images through the bottom of T2. Prevertebral soft
tissues are within normal limits. Advanced cervical spondylosis at
C3 through C7. Loss of intervertebral disc height with endplate
osteophytes and facet arthropathy. Maintenance of vertebral body
height. Trace C7-T1 anterolisthesis could be positional or
degenerative.

Multilevel neural foraminal narrowing bilaterally, secondary to
uncovertebral joint hypertrophy. This is slightly greater on the
right. Facets are well-aligned. Lateral masses and odontoid process
partially obscured. No gross abnormality identified.
IMPRESSION: Advanced mid cervical spondylosis. This decreases the sensitivity of
plain film for acute injury.

Trace C7-T1 anterolisthesis. Favored to be positional or
degenerative.

Given this limitation, no acute fracture or subluxation identified.

Mildly degraded evaluation of C1-2.

## 2013-05-29 IMAGING — CR DG NASAL BONES 3+V
3 series · 3 of 3 positions shown · non-contrast
Comparison: None

CLINICAL DATA: Nasal swelling and bruising, fell this morning,
contusion

EXAM:
NASAL BONES - 3+ VIEW

[w waters]
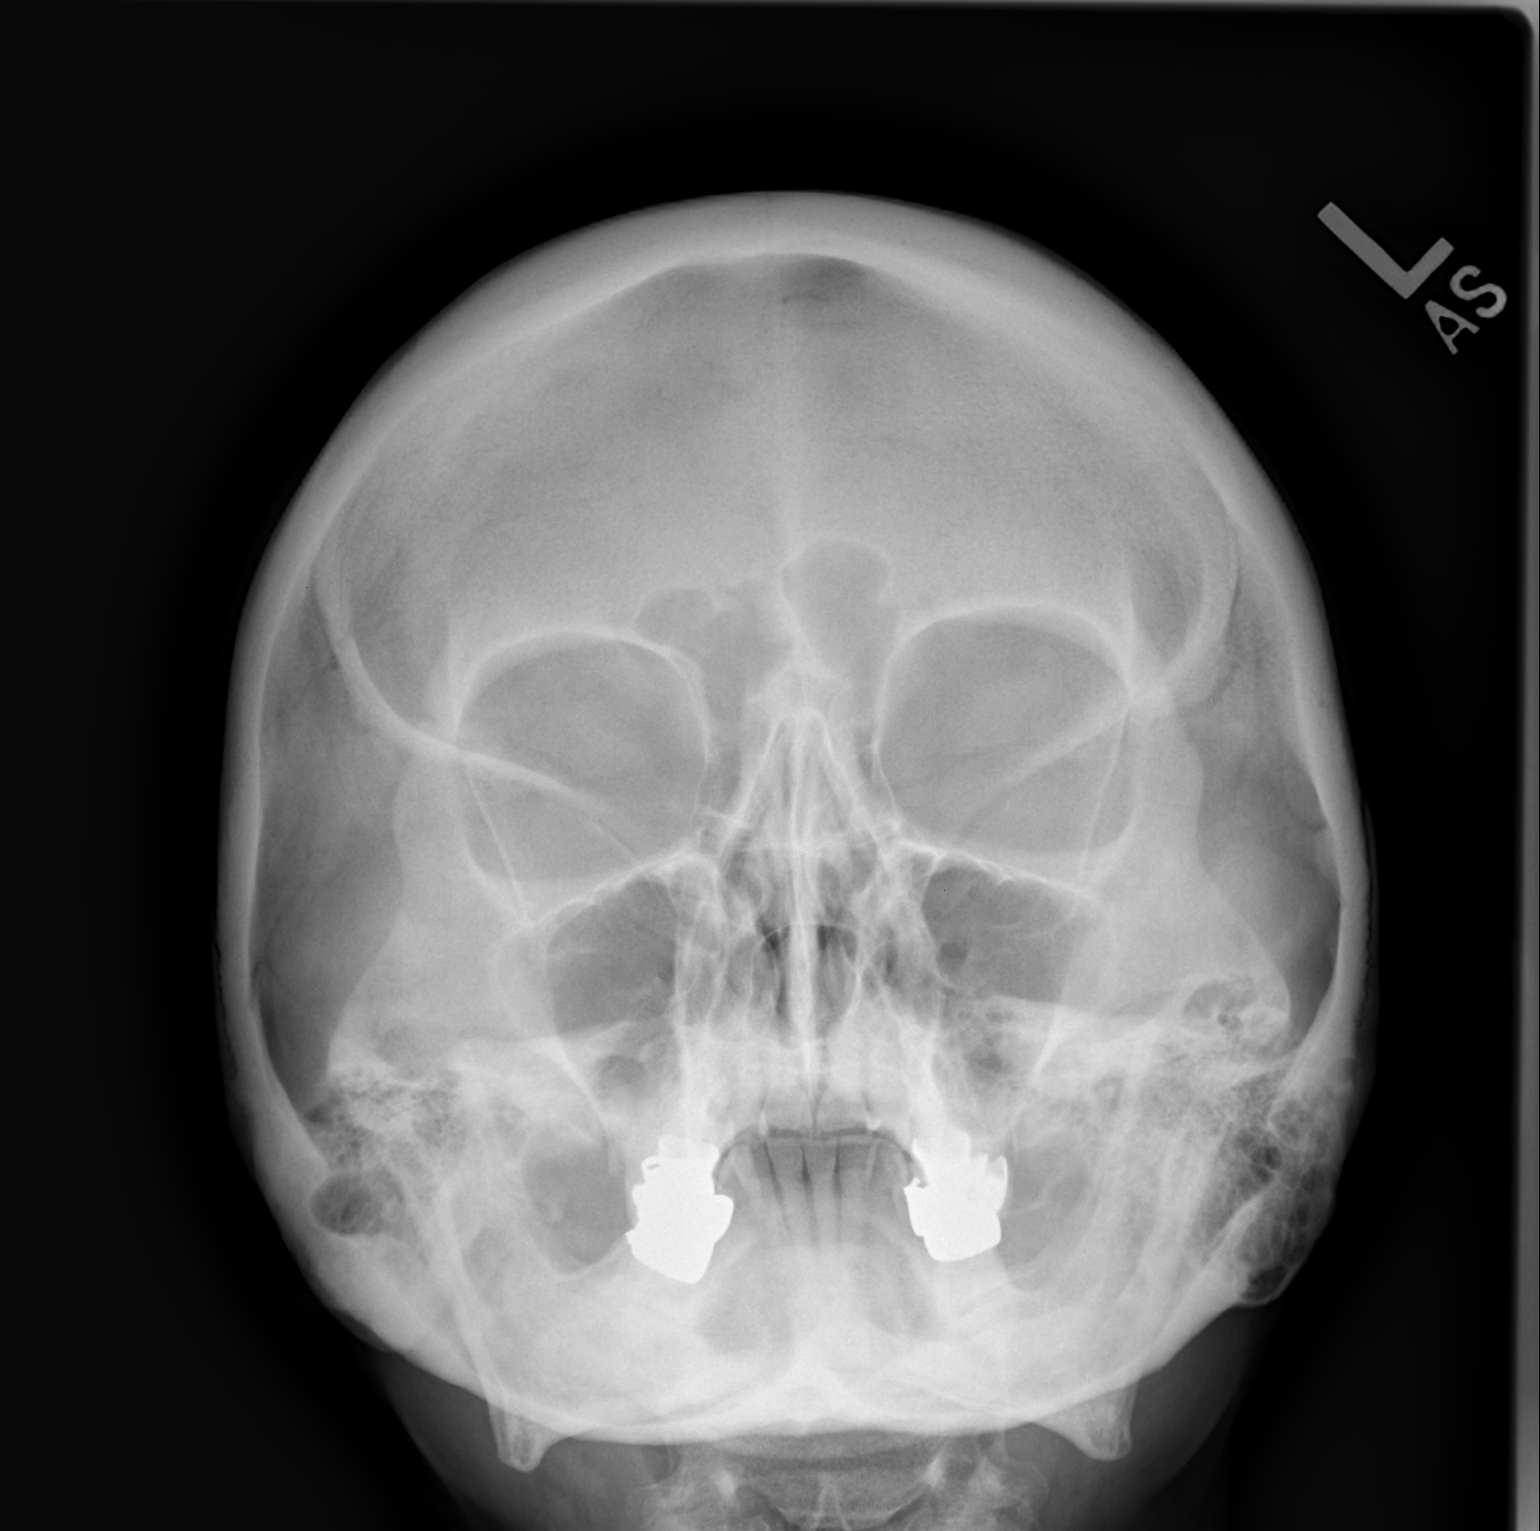

[w nasal bone lat * (1 of 2)]
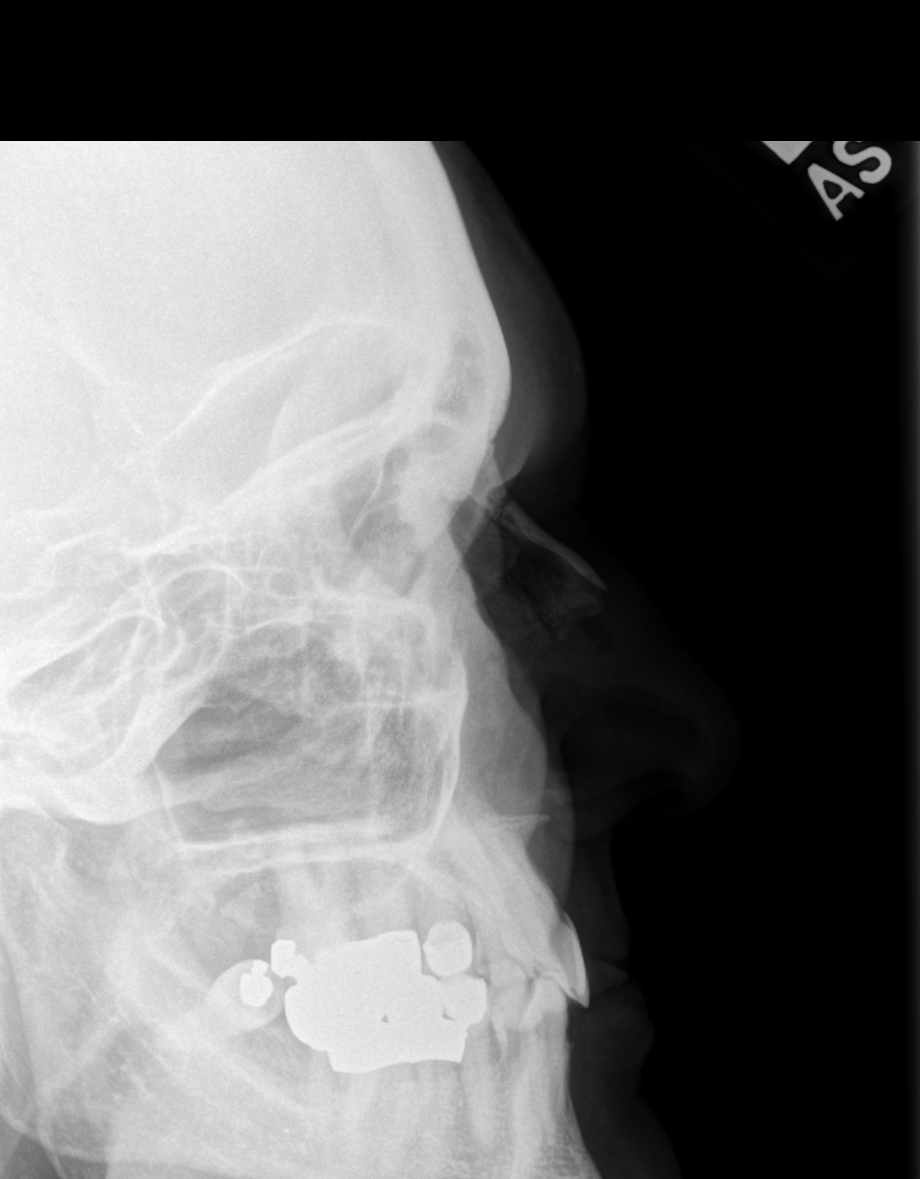

[w nasal bone lat * (2 of 2)]
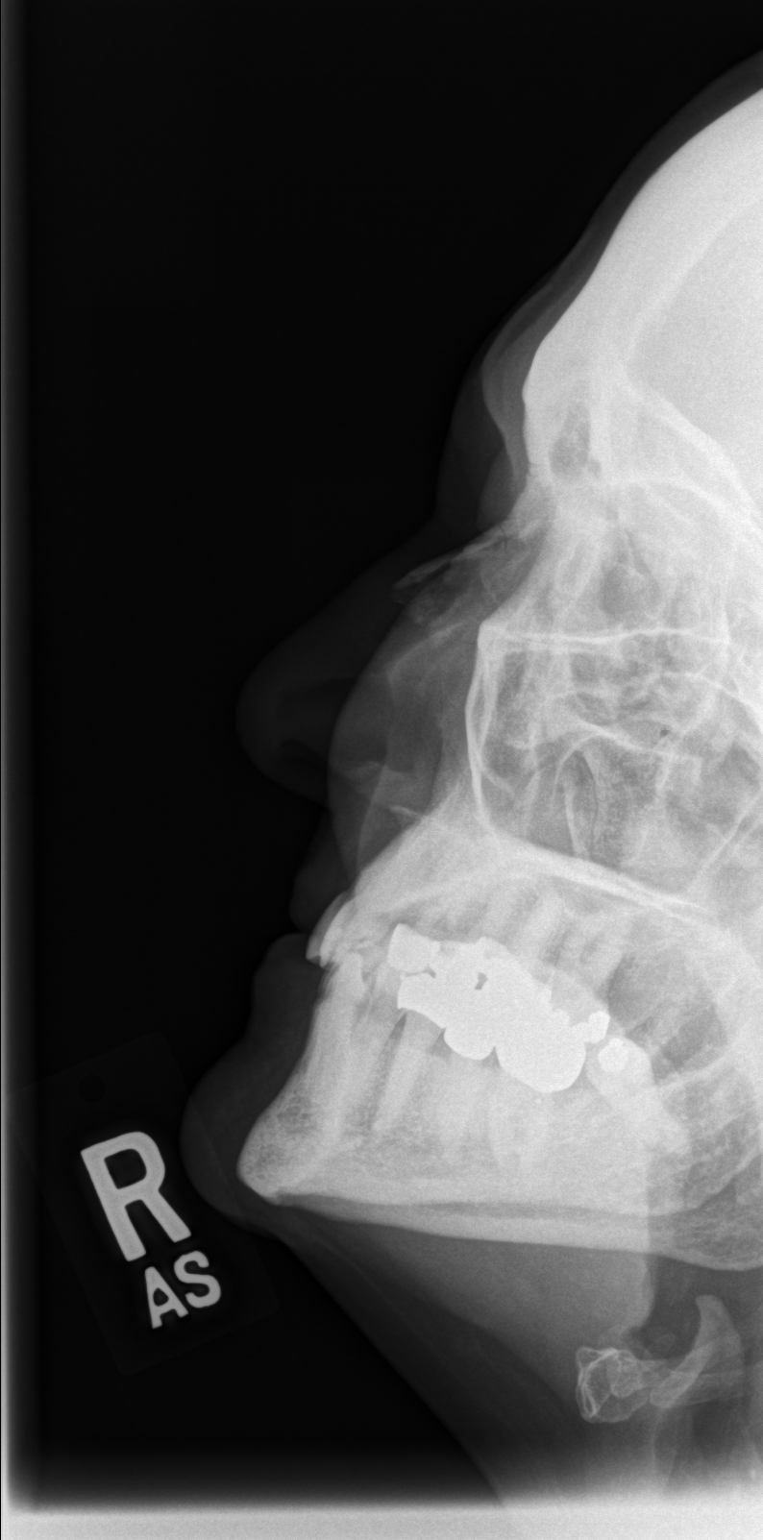

[3 of 3 positions shown; findings below may reference images not displayed]

FINDINGS: Nasal septum midline.

No definite nasal bone fracture identified.

Anterior maxillary spine intact.

Paranasal sinuses clear.
IMPRESSION: No definite nasal bone fracture identified.

## 2013-06-22 ENCOUNTER — Emergency Department (HOSPITAL_COMMUNITY): Payer: Medicare Other

## 2013-06-22 ENCOUNTER — Emergency Department (HOSPITAL_COMMUNITY)
Admission: EM | Admit: 2013-06-22 | Discharge: 2013-06-22 | Disposition: A | Discharge status: Home / Self Care | Payer: Medicare Other | Attending: Emergency Medicine | Admitting: Emergency Medicine

## 2013-06-22 ENCOUNTER — Encounter (HOSPITAL_COMMUNITY): Payer: Self-pay | Admitting: Emergency Medicine

## 2013-06-22 DIAGNOSIS — F411 Generalized anxiety disorder: Secondary | ICD-10-CM | POA: Insufficient documentation

## 2013-06-22 DIAGNOSIS — I10 Essential (primary) hypertension: Secondary | ICD-10-CM | POA: Insufficient documentation

## 2013-06-22 DIAGNOSIS — R072 Precordial pain: Secondary | ICD-10-CM | POA: Insufficient documentation

## 2013-06-22 DIAGNOSIS — R079 Chest pain, unspecified: Secondary | ICD-10-CM

## 2013-06-22 DIAGNOSIS — M25519 Pain in unspecified shoulder: Secondary | ICD-10-CM | POA: Insufficient documentation

## 2013-06-22 DIAGNOSIS — R61 Generalized hyperhidrosis: Secondary | ICD-10-CM | POA: Insufficient documentation

## 2013-06-22 HISTORY — DX: Left bundle-branch block, unspecified: I44.7

## 2013-06-22 HISTORY — DX: Anxiety disorder, unspecified: F41.9

## 2013-06-22 HISTORY — DX: Essential (primary) hypertension: I10

## 2013-06-22 LAB — CBC WITH DIFFERENTIAL/PLATELET
Basophils Absolute: 0 10*3/uL (ref 0.0–0.1)
Basophils Relative: 1 % (ref 0–1)
Eosinophils Absolute: 0.1 10*3/uL (ref 0.0–0.7)
Eosinophils Relative: 1 % (ref 0–5)
HCT: 40.1 % (ref 36.0–46.0)
HEMOGLOBIN: 13.3 g/dL (ref 12.0–15.0)
LYMPHS ABS: 1.3 10*3/uL (ref 0.7–4.0)
Lymphocytes Relative: 26 % (ref 12–46)
MCH: 33 pg (ref 26.0–34.0)
MCHC: 33.2 g/dL (ref 30.0–36.0)
MCV: 99.5 fL (ref 78.0–100.0)
MONOS PCT: 8 % (ref 3–12)
Monocytes Absolute: 0.4 10*3/uL (ref 0.1–1.0)
NEUTROS PCT: 64 % (ref 43–77)
Neutro Abs: 3.2 10*3/uL (ref 1.7–7.7)
Platelets: 254 10*3/uL (ref 150–400)
RBC: 4.03 MIL/uL (ref 3.87–5.11)
RDW: 12.8 % (ref 11.5–15.5)
WBC: 5 10*3/uL (ref 4.0–10.5)

## 2013-06-22 LAB — I-STAT TROPONIN, ED
TROPONIN I, POC: 0.01 ng/mL (ref 0.00–0.08)
Troponin i, poc: 0.01 ng/mL (ref 0.00–0.08)

## 2013-06-22 LAB — BASIC METABOLIC PANEL
BUN: 11 mg/dL (ref 6–23)
CO2: 26 mEq/L (ref 19–32)
Calcium: 9.4 mg/dL (ref 8.4–10.5)
Chloride: 95 mEq/L — ABNORMAL LOW (ref 96–112)
Creatinine, Ser: 0.55 mg/dL (ref 0.50–1.10)
GFR calc non Af Amer: 88 mL/min — ABNORMAL LOW (ref >90)
GLUCOSE: 104 mg/dL — AB (ref 70–99)
POTASSIUM: 3.9 meq/L (ref 3.7–5.3)
SODIUM: 136 meq/L — AB (ref 137–147)

## 2013-06-22 IMAGING — CR DG CHEST 2V
2 series · 2 of 2 positions shown · non-contrast
Comparison: None.

CLINICAL DATA: Chest pain. Left bundle branch block. Hypertension.

EXAM:
CHEST  2 VIEW

[w chest pa]
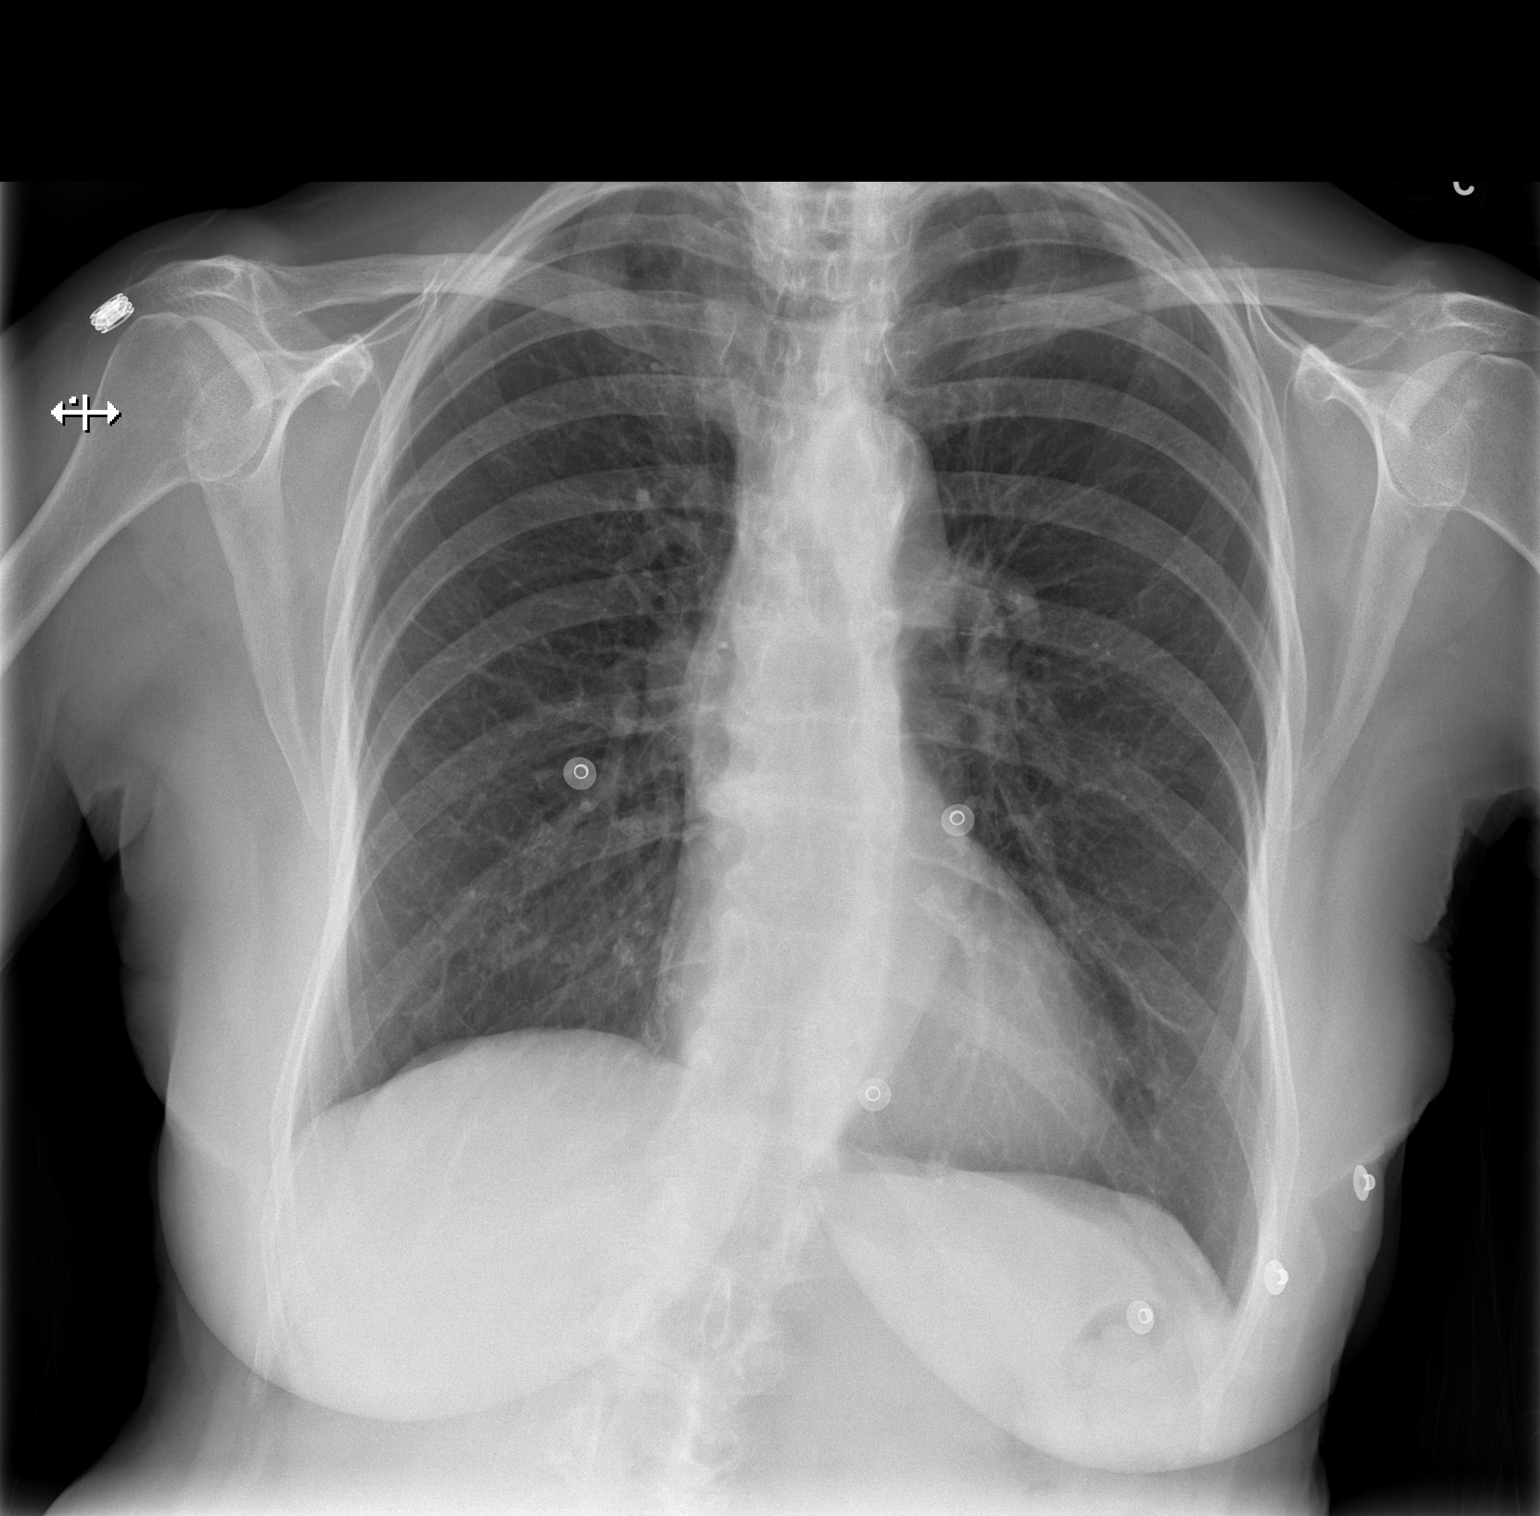

[w chest lat]
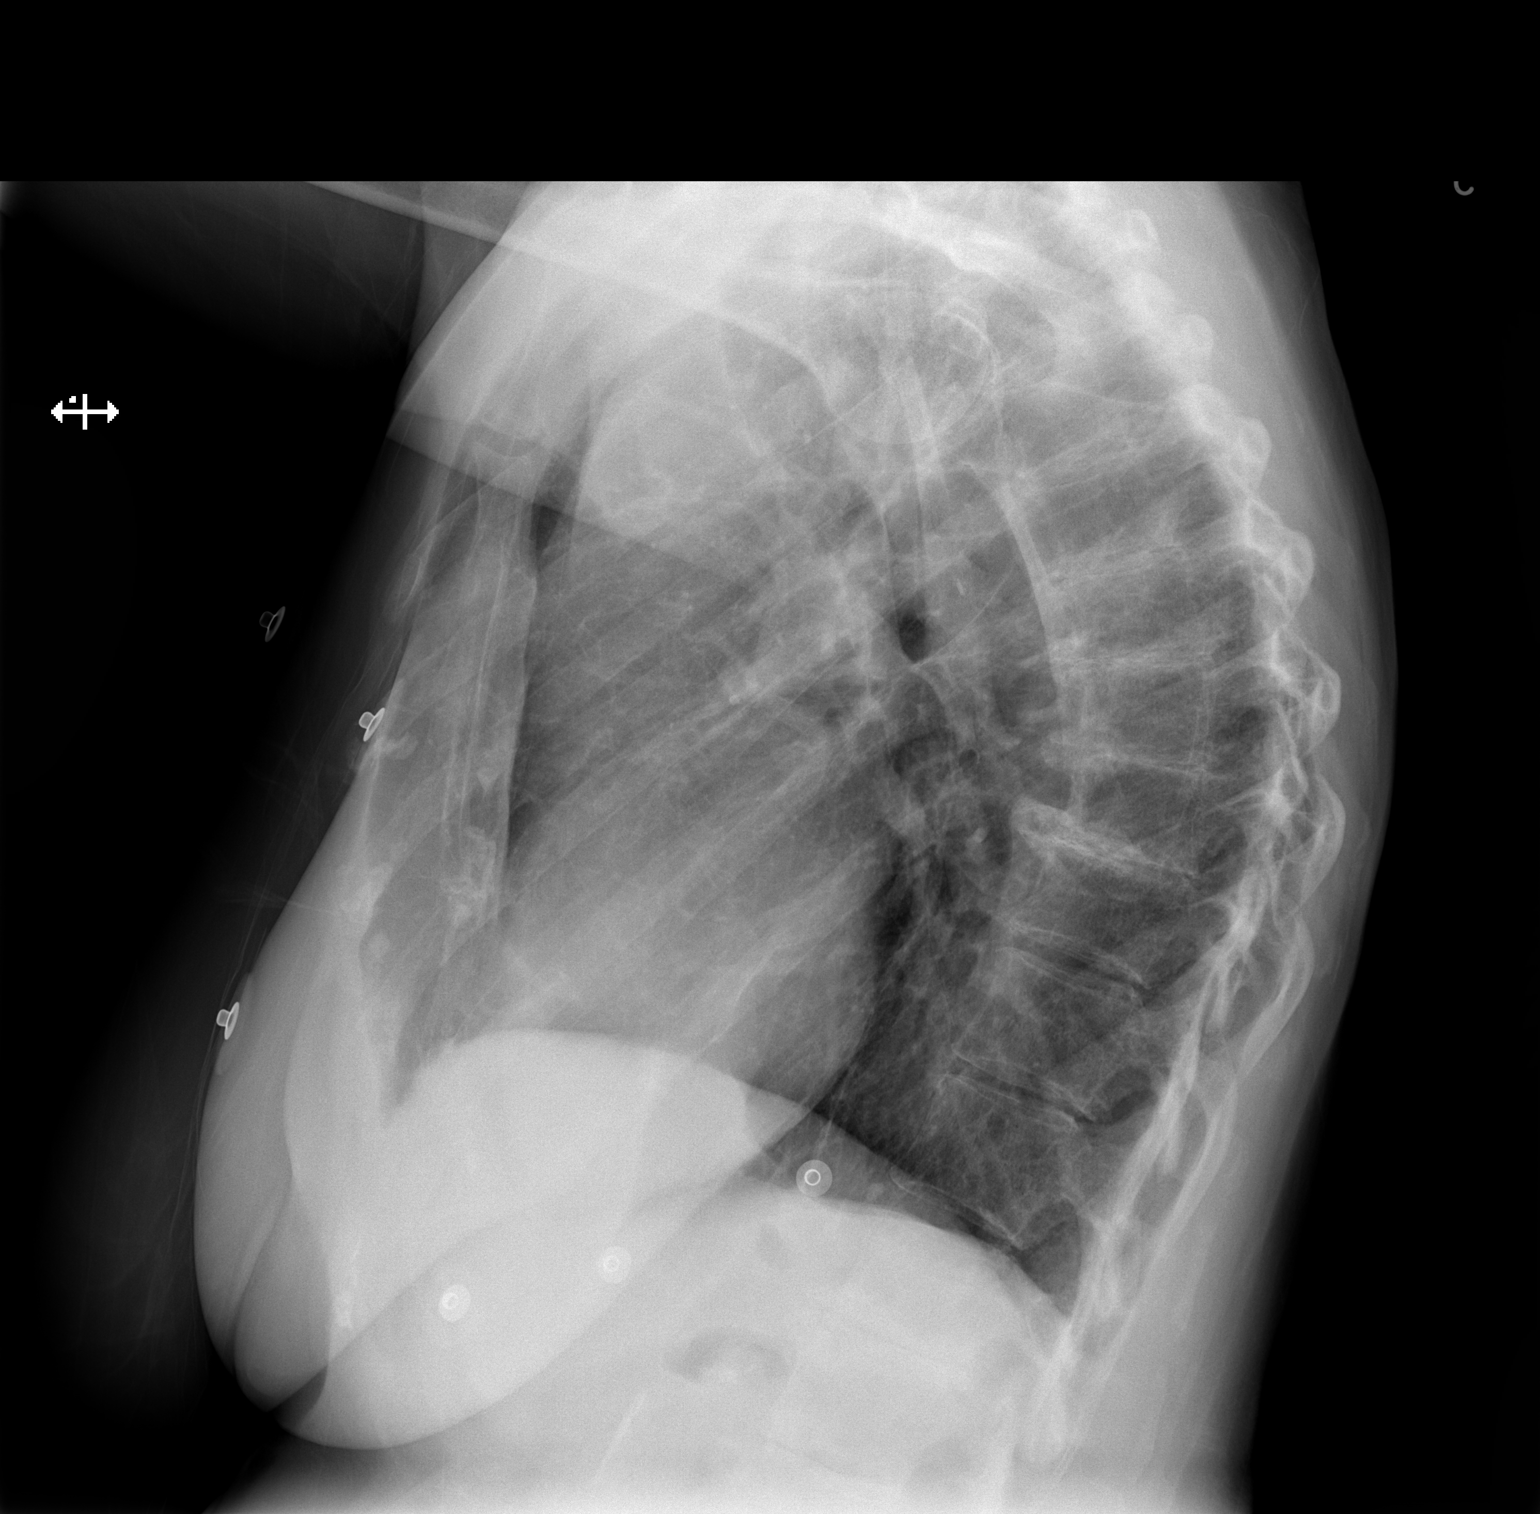

[2 of 2 positions shown; findings below may reference images not displayed]

FINDINGS: The heart size and mediastinal contours are within normal limits.
Both lungs are clear. Mild pulmonary hyperinflation suspicious for
COPD. Thoracic spine degenerative changes also noted.
IMPRESSION: Probable COPD.  No active disease.

## 2013-06-22 NOTE — ED Provider Notes (Signed)
CSN: [id number]     Arrival date & time 06/22/13  [id number] History   First MD Initiated Contact with Patient 06/22/13 1022     Chief Complaint  Patient presents with  . Chest Pain     (Consider location/radiation/quality/duration/timing/severity/associated sxs/prior Treatment) Patient is a 80 y.o. female presenting with chest pain. The history is provided by the patient. No language interpreter was used.  Chest Pain Pain location:  Substernal area Pain quality: pressure   Pain radiates to:  L shoulder and R shoulder Pain radiates to the back: no   Pain severity:  Moderate Onset quality:  Sudden Duration:  30 minutes Timing:  Constant Progression:  Resolved Chronicity:  New Context comment:  While looking up info on bats on the Internet Relieved by:  Nothing Worsened by:  Nothing tried Ineffective treatments:  None tried Associated symptoms: anxiety and diaphoresis   Associated symptoms: no abdominal pain, no back pain, no cough, no fatigue, no fever, no headache, no nausea, no numbness, no palpitations, no shortness of breath, not vomiting and no weakness   Risk factors: aortic disease and hypertension   Risk factors: no coronary artery disease, no diabetes mellitus and no high cholesterol     Past Medical History  Diagnosis Date  . LBBB (left bundle branch block)   . Anxiety   . Hypertension    Past Surgical History  Procedure Laterality Date  . Abdominal hysterectomy    . Bladder surgery     No family history on file. History  Substance Use Topics  . Smoking status: Not on file  . Smokeless tobacco: Not on file  . Alcohol Use: Not on file   OB History   Grav Para Term Preterm Abortions TAB SAB Ect Mult Living                 Review of Systems  Constitutional: Positive for diaphoresis. Negative for fever, chills, activity change, appetite change and fatigue.  HENT: Negative for congestion, facial swelling, rhinorrhea and sore throat.   Eyes: Negative for  photophobia and discharge.  Respiratory: Negative for cough, chest tightness and shortness of breath.   Cardiovascular: Positive for chest pain. Negative for palpitations and leg swelling.  Gastrointestinal: Negative for nausea, vomiting, abdominal pain and diarrhea.  Endocrine: Negative for polydipsia and polyuria.  Genitourinary: Negative for dysuria, frequency, difficulty urinating and pelvic pain.  Musculoskeletal: Negative for arthralgias, back pain, neck pain and neck stiffness.  Skin: Negative for color change and wound.  Allergic/Immunologic: Negative for immunocompromised state.  Neurological: Negative for facial asymmetry, weakness, numbness and headaches.  Hematological: Does not bruise/bleed easily.  Psychiatric/Behavioral: Negative for confusion and agitation.      Allergies  Hydrochlorothiazide; Ramipril; and Remeron  Home Medications   Prior to Admission medications   Not on File   BP 150/56  Pulse 81  Temp(Src) 97.5 F (36.4 C) (Oral)  Resp 18  Ht 5\' 3"  (1.6 m)  Wt 135 lb (61.236 kg)  BMI 23.92 kg/m2  SpO2 100% Physical Exam  Constitutional: She is oriented to person, place, and time. She appears well-developed and well-nourished. No distress.  HENT:  Head: Normocephalic and atraumatic.  Mouth/Throat: No oropharyngeal exudate.  Eyes: Pupils are equal, round, and reactive to light.  Neck: Normal range of motion. Neck supple.  Cardiovascular: Normal rate, regular rhythm and normal heart sounds.  Exam reveals no gallop and no friction rub.   No murmur heard. Pulmonary/Chest: Effort normal and breath sounds normal. No respiratory  distress. She has no wheezes. She has no rales.  Abdominal: Soft. Bowel sounds are normal. She exhibits no distension and no mass. There is no tenderness. There is no rebound and no guarding.  Musculoskeletal: Normal range of motion. She exhibits no edema and no tenderness.  Neurological: She is alert and oriented to person, place,  and time.  Skin: Skin is warm and dry.  Psychiatric: She has a normal mood and affect.    ED Course  Procedures (including critical care time) Labs Review Labs Reviewed  BASIC METABOLIC PANEL - Abnormal; Notable for the following:    Sodium 136 (*)    Chloride 95 (*)    Glucose, Bld 104 (*)    GFR calc non Af Amer 88 (*)    All other components within normal limits  CBC WITH DIFFERENTIAL  I-STAT TROPOININ, ED  Rosezena Sensor, ED    Imaging Review Dg Chest 2 View  06/22/2013   CLINICAL DATA:  Chest pain. Left bundle branch block. Hypertension.  EXAM: CHEST  2 VIEW  COMPARISON:  None.  FINDINGS: The heart size and mediastinal contours are within normal limits. Both lungs are clear. Mild pulmonary hyperinflation suspicious for COPD. Thoracic spine degenerative changes also noted.  IMPRESSION: Probable COPD.  No active disease.   Electronically Signed   By: Myles Rosenthal M.D.   On: 06/22/2013 11:18     EKG Interpretation   Date/Time:  Sunday Jun 22 2013 09:55:52 EDT Ventricular Rate:  77 PR Interval:  163 QRS Duration: 133 QT Interval:  518 QTC Calculation: 586 R Axis:   9 Text Interpretation:  Sinus rhythm Left atrial enlargement Left bundle  branch block no prior for comparison Confirmed by DOCHERTY  MD, MEGAN  ([id number]) on 06/22/2013 10:11:59 AM      MDM   Final diagnoses:  Chest pain    Pt is a 80 y.o. female with Pmhx as above who presents with about 30 mins of CP at home with assoc BL shoulder pain, short period of diaphoresis. Symptoms occurred while looking up info on bats on the Internet as her family saw bats fly into her basement last night. She states the info she learned was concerning. EKG w/ LBBB (reports hx of same), CXR nml. Delta trop negative and symptoms did not reoccur during stay.  I believe she can continue outpt f/u with PCP including possible outpt stress testing. She has echo already scheduled for Wednesday. She agrees to return for reoccurring  symptoms.         Shanna Cisco, MD 06/23/13 [phone]

## 2013-06-22 NOTE — Discharge Instructions (Signed)
Chest Pain (Nonspecific) °It is often hard to give a specific diagnosis for the cause of chest pain. There is always a chance that your pain could be related to something serious, such as a heart attack or a blood clot in the lungs. You need to follow up with your caregiver for further evaluation. °CAUSES  °· Heartburn. °· Pneumonia or bronchitis. °· Anxiety or stress. °· Inflammation around your heart (pericarditis) or lung (pleuritis or pleurisy). °· A blood clot in the lung. °· A collapsed lung (pneumothorax). It can develop suddenly on its own (spontaneous pneumothorax) or from injury (trauma) to the chest. °· Shingles infection (herpes zoster virus). °The chest wall is composed of bones, muscles, and cartilage. Any of these can be the source of the pain. °· The bones can be bruised by injury. °· The muscles or cartilage can be strained by coughing or overwork. °· The cartilage can be affected by inflammation and become sore (costochondritis). °DIAGNOSIS  °Lab tests or other studies, such as X-rays, electrocardiography, stress testing, or cardiac imaging, may be needed to find the cause of your pain.  °TREATMENT  °· Treatment depends on what may be causing your chest pain. Treatment may include: °· Acid blockers for heartburn. °· Anti-inflammatory medicine. °· Pain medicine for inflammatory conditions. °· Antibiotics if an infection is present. °· You may be advised to change lifestyle habits. This includes stopping smoking and avoiding alcohol, caffeine, and chocolate. °· You may be advised to keep your head raised (elevated) when sleeping. This reduces the chance of acid going backward from your stomach into your esophagus. °· Most of the time, nonspecific chest pain will improve within 2 to 3 days with rest and mild pain medicine. °HOME CARE INSTRUCTIONS  °· If antibiotics were prescribed, take your antibiotics as directed. Finish them even if you start to feel better. °· For the next few days, avoid physical  activities that bring on chest pain. Continue physical activities as directed. °· Do not smoke. °· Avoid drinking alcohol. °· Only take over-the-counter or prescription medicine for pain, discomfort, or fever as directed by your caregiver. °· Follow your caregiver's suggestions for further testing if your chest pain does not go away. °· Keep any follow-up appointments you made. If you do not go to an appointment, you could develop lasting (chronic) problems with pain. If there is any problem keeping an appointment, you must call to reschedule. °SEEK MEDICAL CARE IF:  °· You think you are having problems from the medicine you are taking. Read your medicine instructions carefully. °· Your chest pain does not go away, even after treatment. °· You develop a rash with blisters on your chest. °SEEK IMMEDIATE MEDICAL CARE IF:  °· You have increased chest pain or pain that spreads to your arm, neck, jaw, back, or abdomen. °· You develop shortness of breath, an increasing cough, or you are coughing up blood. °· You have severe back or abdominal pain, feel nauseous, or vomit. °· You develop severe weakness, fainting, or chills. °· You have a fever. °THIS IS AN EMERGENCY. Do not wait to see if the pain will go away. Get medical help at once. Call your local emergency services (911 in [place] Do not drive yourself to the hospital. °MAKE SURE YOU:  °· Understand these instructions. °· Will watch your condition. °· Will get help right away if you are not doing well or get worse. °Document Released: 11/09/2004 Document Revised: 04/24/2011 Document Reviewed: 09/05/2007 °[employer]® Patient Information ©2014 [employer]   [employer]. ° °

## 2013-06-22 NOTE — ED Notes (Signed)
Pt arrived from home by [facility] with c/o chest pressure. Pt has been having her granddaughter staying with her the past couple of weeks and the granddaughter told her that there were bats in the attic. After granddaughter told her that she started to get the chest pressure in the center of the chest that radiated to bilateral shoulders. Stated that she also had a light cold sweat for a few seconds. When EMS arrived pt was pain free and has been pain free since. Pt self administered ASA 325mg  at home. 12 lead showed left bundle branch block. Pt also has hx of LBBB. VS WNL. 18g Left AC

## 2013-07-02 ENCOUNTER — Other Ambulatory Visit (HOSPITAL_COMMUNITY): Payer: Self-pay | Admitting: Geriatric Medicine

## 2013-07-02 ENCOUNTER — Ambulatory Visit (HOSPITAL_COMMUNITY): Payer: Medicare Other | Attending: Geriatric Medicine | Admitting: Cardiology

## 2013-07-02 DIAGNOSIS — R079 Chest pain, unspecified: Secondary | ICD-10-CM | POA: Insufficient documentation

## 2013-07-02 NOTE — Progress Notes (Signed)
Echo performed. 

## 2013-07-16 ENCOUNTER — Telehealth: Payer: Self-pay | Admitting: Cardiology

## 2013-07-22 ENCOUNTER — Encounter: Payer: Self-pay | Admitting: *Deleted

## 2013-07-24 ENCOUNTER — Encounter: Payer: Self-pay | Admitting: Cardiology

## 2013-07-24 ENCOUNTER — Ambulatory Visit (INDEPENDENT_AMBULATORY_CARE_PROVIDER_SITE_OTHER): Payer: Medicare Other | Admitting: Cardiology

## 2013-07-24 VITALS — BP 170/96 | HR 84 | Ht 63.0 in | Wt 134.1 lb

## 2013-07-24 DIAGNOSIS — I428 Other cardiomyopathies: Secondary | ICD-10-CM

## 2013-07-24 DIAGNOSIS — I420 Dilated cardiomyopathy: Secondary | ICD-10-CM

## 2013-07-24 NOTE — Progress Notes (Signed)
HPI The patient presents for evaluation reduced ejection fraction. She did have a heart catheterization years ago chest pain. I was able to review this. 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 She had a normal ejection fraction at that time. She has been noted to have a left bundle branch block and I don't see an old EKG for comparison. She was in the emergency room for chest discomfort in May and I reviewed these records. However, there was no objective evidence of ischemia. She was sent to Ginette Otto, MD.  He did order an echocardiogram which demonstrated her ejection fraction was 35%.  There was moderate mitral regurgitation.  She currently denies any symptoms that she has had chest discomfort since this episode. She denies chest pressure, neck or arm discomfort. She may have some mild shortness of breath occasionally. She's had no PND or orthopnea however. She has had no palpitations, presyncope or syncope.  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 However, this apparently did not need to be treated. Her blood pressure is elevated here but she reports that it is well controlled at home with readings typically in the 120s systolic.  Allergies  Allergen Reactions  . Hydrochlorothiazide Other (See Comments)    hyponatremia  . Ramipril Other (See Comments)    GI upset  . Remeron [Mirtazapine] Other (See Comments)    weakness    Current Outpatient Prescriptions  Medication Sig Dispense Refill  . amLODipine (NORVASC) 2.5 MG tablet Take 2.5 mg by mouth daily.      . calcium carbonate (TUMS EX) 750 MG chewable tablet Chew 1 tablet by mouth as needed.       . cholecalciferol (VITAMIN D) 1000 UNITS tablet Take 1,000 Units by mouth daily.      . Cinnamon 500 MG TABS Take 1,000 mg by mouth daily.      . diphenhydramine-acetaminophen (TYLENOL PM) 25-500 MG TABS Take 2  tablets by mouth at bedtime as needed (sleep).      Marland Kitchen estradiol (ESTRACE) 1 MG tablet Take 1 mg by mouth daily.      . Glucosamine 500 MG CAPS Take 500 capsules by mouth 3 (three) times daily.      Marland Kitchen HYDROcodone-acetaminophen (NORCO/VICODIN) 5-325 MG per tablet Take 0.5 tablets by mouth daily as needed.      Marland Kitchen levothyroxine (SYNTHROID, LEVOTHROID) 50 MCG tablet Take 50 mcg by mouth daily before breakfast.      . LORazepam (ATIVAN) 0.5 MG tablet Take 0.5 mg by mouth at bedtime.      . Magnesium 200 MG TABS Take 200 mg by mouth daily.      . Melatonin 1 MG TABS Take 1 mg by mouth at bedtime as needed (sleep).      . Multiple Vitamins-Minerals (MULTIVITAMIN PO) Take 1 tablet by mouth daily.      . Omega-3 Fatty Acids (FISH OIL) 1200 MG CAPS Take 1,200 mg by mouth daily.      Marland Kitchen omeprazole (PRILOSEC) 20 MG capsule Take 20 mg by mouth every other day.       . S-Adenosylmethionine (SAM-E) 400 MG TABS Take 400 mg by mouth daily.      . TURMERIC CURCUMIN PO Take 400 mg by mouth daily.       No current facility-administered medications for this visit.    Past Medical History  Diagnosis Date  . LBBB (left bundle branch  block)   . Anxiety   . Hypertension     Past Surgical History  Procedure Laterality Date  . Abdominal hysterectomy    . Bladder surgery      No family history on file.  History   Social History  . Marital Status: Widowed    Spouse Name: N/A    Number of Children: N/A  . Years of Education: N/A   Occupational History  . Not on file.   Social History Main Topics  . Smoking status: Never Smoker   . Smokeless tobacco: Not on file  . Alcohol Use: Not on file  . Drug Use: Not on file  . Sexual Activity: Not on file   Other Topics Concern  . Not on file   Social History Narrative  . No narrative on file    ROS:  Back and knee pain, constipation.  Otherwise as stated in the HPI and negative for all other systems.   PHYSICAL EXAM BP 170/96  Pulse 84  Ht 5\' 3"   (1.6 m)  Wt 134 lb 1.6 oz (60.827 kg)  BMI 23.76 kg/m2  SpO2 98% GENERAL:  Well appearing HEENT:  Pupils equal round and reactive, fundi not visualized, oral mucosa unremarkable NECK:  No jugular venous distention, waveform within normal limits, carotid upstroke brisk and symmetric, no bruits, no thyromegaly LYMPHATICS:  No cervical, inguinal adenopathy LUNGS:  Clear to auscultation bilaterally BACK:  No CVA tenderness CHEST:  Unremarkable HEART:  PMI not displaced or sustained,S1 and S2 within normal limits, no S3, no S4, no clicks, no rubs, soft apical systolic murmur early peaking and short, no diastolic  murmurs ABD:  Flat, positive bowel sounds normal in frequency in pitch, no bruits, no rebound, no guarding, no midline pulsatile mass, no hepatomegaly, no splenomegaly EXT:  2 plus pulses throughout, no edema, no cyanosis no clubbing, right femoral bruit SKIN:  No rashes no nodules NEURO:  Cranial nerves II through XII grossly intact, motor grossly intact throughout PSYCH:  Cognitively intact, oriented to person place and time   EKG:   NSR, rate 77, axis WNL, first degree AV block.  No acute ST T wave changes.  07/24/2013  ASSESSMENT AND PLAN  CARDIOMYOPATHY:  She will need a cardiac cath to evaluate the etiology of her cardiomyopathy. I think this possibility that her hypertension has been more poorly controlled then she knows and this could be etiology as well. She will have renal angiogram at the time of her catheterization. Her blood pressure will be addressed as below.  HTN:  I have asked her to bring in her blood pressure cuff to correlate this. She may need further adjustments. I will also titrating for management of her cardiomyopathy.  RAS:  Her most recent renal function appeared to be normal. However, I will evaluate as above.  MR:  This can be followed clinically.

## 2013-07-24 NOTE — Patient Instructions (Addendum)
The current medical regimen is effective;  continue present plan and medications.  Your physician has requested that you have a cardiac catheterization. Cardiac catheterization is used to diagnose and/or treat various heart conditions. Doctors may recommend this procedure for a number of different reasons. The most common reason is to evaluate chest pain. Chest pain can be a symptom of coronary artery disease (CAD), and cardiac catheterization can show whether plaque is narrowing or blocking your heart's arteries. This procedure is also used to evaluate the valves, as well as measure the blood flow and oxygen levels in different parts of your heart. For further information please visit [email or web address] Please follow instruction sheet, as given. You will be called with instructions, date and time. (tenative date 7/21).  Follow up will be scheduled after your cath.

## 2013-07-31 ENCOUNTER — Encounter: Payer: Self-pay | Admitting: *Deleted

## 2013-07-31 ENCOUNTER — Telehealth: Payer: Self-pay | Admitting: Cardiology

## 2013-07-31 DIAGNOSIS — Z01810 Encounter for preprocedural cardiovascular examination: Secondary | ICD-10-CM

## 2013-07-31 DIAGNOSIS — I509 Heart failure, unspecified: Secondary | ICD-10-CM

## 2013-07-31 DIAGNOSIS — I10 Essential (primary) hypertension: Secondary | ICD-10-CM

## 2013-07-31 NOTE — Telephone Encounter (Signed)
This case has been scheduled for Dr Antoine Poche on July 21 - pt aware - instructions to be mailed to her home address - she will come in for lab no more than 1 week prior.

## 2013-07-31 NOTE — Telephone Encounter (Signed)
Pt aware of date,time and instructions - also that instructions will be mailed to her home.  She will go to the [place] office for labs and call back with any further questions or concerns.

## 2013-07-31 NOTE — Telephone Encounter (Signed)
This patient has called inquiring about her cardiac catheterization that was discussed on her last visit with Dr. Antoine Poche.  She was given a tentative date of 09/02/13.  Do you want me to schedule her procedure or are you working on this?  Just let me know.

## 2013-08-01 ENCOUNTER — Ambulatory Visit: Payer: Medicare Other | Admitting: Cardiology

## 2013-08-18 ENCOUNTER — Ambulatory Visit: Payer: Medicare Other | Admitting: Interventional Cardiology

## 2013-08-19 ENCOUNTER — Telehealth: Payer: Self-pay | Admitting: Cardiology

## 2013-08-19 NOTE — Telephone Encounter (Signed)
New problem    Pt has questions about staying over night for her Cath. Please call pt.

## 2013-08-21 NOTE — Telephone Encounter (Signed)
Talked to pt. And answered all her questions

## 2013-08-25 ENCOUNTER — Encounter (HOSPITAL_COMMUNITY): Payer: Self-pay | Admitting: Pharmacy Technician

## 2013-08-28 ENCOUNTER — Other Ambulatory Visit (INDEPENDENT_AMBULATORY_CARE_PROVIDER_SITE_OTHER): Payer: Medicare Other

## 2013-08-28 DIAGNOSIS — I10 Essential (primary) hypertension: Secondary | ICD-10-CM

## 2013-08-28 DIAGNOSIS — Z01810 Encounter for preprocedural cardiovascular examination: Secondary | ICD-10-CM

## 2013-08-28 DIAGNOSIS — I509 Heart failure, unspecified: Secondary | ICD-10-CM

## 2013-08-28 LAB — BASIC METABOLIC PANEL
BUN: 13 mg/dL (ref 6–23)
CO2: 27 mEq/L (ref 19–32)
CREATININE: 0.6 mg/dL (ref 0.4–1.2)
Calcium: 9.4 mg/dL (ref 8.4–10.5)
Chloride: 97 mEq/L (ref 96–112)
GFR: 95.18 mL/min (ref >60.00)
Glucose, Bld: 100 mg/dL — ABNORMAL HIGH (ref 70–99)
POTASSIUM: 4.1 meq/L (ref 3.5–5.1)
Sodium: 133 mEq/L — ABNORMAL LOW (ref 135–145)

## 2013-08-28 LAB — CBC
HEMATOCRIT: 39.3 % (ref 36.0–46.0)
Hemoglobin: 13 g/dL (ref 12.0–15.0)
MCHC: 33 g/dL (ref 30.0–36.0)
MCV: 98 fl (ref 78.0–100.0)
Platelets: 246 10*3/uL (ref 150.0–400.0)
RBC: 4.01 Mil/uL (ref 3.87–5.11)
RDW: 13 % (ref 11.5–15.5)
WBC: 6.9 10*3/uL (ref 4.0–10.5)

## 2013-08-28 LAB — PROTIME-INR
INR: 0.9 ratio (ref 0.8–1.0)
PROTHROMBIN TIME: 9.7 s (ref 9.6–13.1)

## 2013-08-29 ENCOUNTER — Ambulatory Visit: Payer: Medicare Other | Admitting: Cardiology

## 2013-09-02 ENCOUNTER — Ambulatory Visit (HOSPITAL_COMMUNITY)
Admission: RE | Admit: 2013-09-02 | Discharge: 2013-09-02 | Disposition: A | Discharge status: Home / Self Care | Payer: Medicare Other | Source: Ambulatory Visit | Attending: Cardiology | Admitting: Cardiology

## 2013-09-02 ENCOUNTER — Encounter (HOSPITAL_COMMUNITY)
Admission: RE | Disposition: A | Discharge status: Home / Self Care | Payer: Self-pay | Source: Ambulatory Visit | Attending: Cardiology

## 2013-09-02 DIAGNOSIS — I2510 Atherosclerotic heart disease of native coronary artery without angina pectoris: Secondary | ICD-10-CM | POA: Diagnosis not present

## 2013-09-02 DIAGNOSIS — E039 Hypothyroidism, unspecified: Secondary | ICD-10-CM | POA: Insufficient documentation

## 2013-09-02 DIAGNOSIS — F411 Generalized anxiety disorder: Secondary | ICD-10-CM | POA: Insufficient documentation

## 2013-09-02 DIAGNOSIS — I447 Left bundle-branch block, unspecified: Secondary | ICD-10-CM | POA: Diagnosis not present

## 2013-09-02 DIAGNOSIS — I7789 Other specified disorders of arteries and arterioles: Secondary | ICD-10-CM | POA: Diagnosis not present

## 2013-09-02 DIAGNOSIS — I428 Other cardiomyopathies: Secondary | ICD-10-CM | POA: Insufficient documentation

## 2013-09-02 DIAGNOSIS — Z888 Allergy status to other drugs, medicaments and biological substances status: Secondary | ICD-10-CM | POA: Diagnosis not present

## 2013-09-02 DIAGNOSIS — R9431 Abnormal electrocardiogram [ECG] [EKG]: Secondary | ICD-10-CM

## 2013-09-02 DIAGNOSIS — I10 Essential (primary) hypertension: Secondary | ICD-10-CM | POA: Insufficient documentation

## 2013-09-02 DIAGNOSIS — Q245 Malformation of coronary vessels: Secondary | ICD-10-CM | POA: Diagnosis not present

## 2013-09-02 HISTORY — PX: LEFT HEART CATHETERIZATION WITH CORONARY ANGIOGRAM: SHX5451

## 2013-09-02 HISTORY — PX: RENAL ANGIOGRAM: SHX5509

## 2013-09-02 SURGERY — LEFT HEART CATHETERIZATION WITH CORONARY ANGIOGRAM
Anesthesia: LOCAL

## 2013-09-02 MED ORDER — SODIUM CHLORIDE 0.9 % IJ SOLN: 3.0000 mL | INTRAMUSCULAR | Status: DC | PRN

## 2013-09-02 MED ORDER — LIDOCAINE HCL (PF) 1 % IJ SOLN
INTRAMUSCULAR | Status: AC
  Filled 2013-09-02: qty 30

## 2013-09-02 MED ORDER — SODIUM CHLORIDE 0.9 % IV SOLN
INTRAVENOUS | Status: DC
  Administered 2013-09-02: 1000 mL via INTRAVENOUS

## 2013-09-02 MED ORDER — SODIUM CHLORIDE 0.9 % IV SOLN: 250.0000 mL | INTRAVENOUS | Status: DC | PRN

## 2013-09-02 MED ORDER — ASPIRIN 81 MG PO CHEW
81.0000 mg | CHEWABLE_TABLET | Freq: Once | ORAL | Status: AC
  Administered 2013-09-02: 81 mg via ORAL

## 2013-09-02 MED ORDER — NITROGLYCERIN 1 MG/10 ML FOR IR/CATH LAB
INTRA_ARTERIAL | Status: AC
  Filled 2013-09-02: qty 10

## 2013-09-02 MED ORDER — HEPARIN (PORCINE) IN NACL 2-0.9 UNIT/ML-% IJ SOLN
INTRAMUSCULAR | Status: AC
Start: 2013-09-02 — End: 2013-09-02
  Filled 2013-09-02: qty 1000

## 2013-09-02 MED ORDER — ONDANSETRON HCL 4 MG/2ML IJ SOLN: 4.0000 mg | Freq: Four times a day (QID) | INTRAMUSCULAR | Status: DC | PRN

## 2013-09-02 MED ORDER — ASPIRIN 81 MG PO CHEW
CHEWABLE_TABLET | ORAL | Status: AC
  Administered 2013-09-02: 81 mg via ORAL
  Filled 2013-09-02: qty 1

## 2013-09-02 MED ORDER — MIDAZOLAM HCL 2 MG/2ML IJ SOLN
INTRAMUSCULAR | Status: AC
  Filled 2013-09-02: qty 2

## 2013-09-02 MED ORDER — SODIUM CHLORIDE 0.9 % IJ SOLN: 3.0000 mL | Freq: Two times a day (BID) | INTRAMUSCULAR | Status: DC

## 2013-09-02 MED ORDER — SODIUM CHLORIDE 0.9 % IV SOLN: INTRAVENOUS | Status: DC

## 2013-09-02 MED ORDER — ACETAMINOPHEN 325 MG PO TABS: 650.0000 mg | ORAL_TABLET | ORAL | Status: DC | PRN

## 2013-09-02 NOTE — Progress Notes (Signed)
Voided 400cc of yellow urine 

## 2013-09-02 NOTE — Discharge Instructions (Signed)
Angiogram, Care After °Refer to this sheet in the next few weeks. These instructions provide you with information on caring for yourself after your procedure. Your health care provider may also give you more specific instructions. Your treatment has been planned according to current medical practices, but problems sometimes occur. Call your health care provider if you have any problems or questions after your procedure.  °WHAT TO EXPECT AFTER THE PROCEDURE °After your procedure, it is typical to have the following sensations: °· Minor discomfort or tenderness and a small bump at the catheter insertion site. The bump should usually decrease in size and tenderness within 1 to 2 weeks. °· Any bruising will usually fade within 2 to 4 weeks. °HOME CARE INSTRUCTIONS  °· You may need to keep taking blood thinners if they were prescribed for you. Only take over-the-counter or prescription medicines for pain, fever, or discomfort as directed by your health care provider. °· Do not apply powder or lotion to the site. °· Do not sit in a bathtub, swimming pool, or whirlpool for 5 to 7 days. °· You may shower 24 hours after the procedure. Remove the bandage (dressing) and gently wash the site with plain soap and water. Gently pat the site dry. °· Inspect the site at least twice daily. °· Limit your activity for the first 48 hours. Do not bend, squat, or lift anything over 20 lb (9 kg) or as directed by your health care provider. °· Do not drive home if you are discharged the day of the procedure. Have someone else drive you. Follow instructions about when you can drive or return to work. °SEEK MEDICAL CARE IF: °· You get lightheaded when standing up. °· You have drainage (other than a small amount of blood on the dressing). °· You have chills. °· You have a fever. °· You have redness, warmth, swelling, or pain at the insertion site. °SEEK IMMEDIATE MEDICAL CARE IF:  °· You develop chest pain or shortness of breath, feel faint,  or pass out. °· You have bleeding, swelling larger than a walnut, or drainage from the catheter insertion site. °· You develop pain, discoloration, coldness, or severe bruising in the leg or arm that held the catheter. °· You develop bleeding from any other place, such as the bowels. You may see bright red blood in your urine or stools, or your stools may appear black and tarry. °· You have heavy bleeding from the site. If this happens, hold pressure on the site. If bleeding is not under control call 911. °MAKE SURE YOU: °· Understand these instructions. °· Will watch your condition. °· Will get help right away if you are not doing well or get worse. °Document Released: 08/18/2004 Document Revised: 02/04/2013 Document Reviewed: 06/24/2012 °[employer]® Patient Information ©2015 [employer]. This information is not intended to replace advice given to you by your health care provider. Make sure you discuss any questions you have with your health care provider. ° ° °

## 2013-09-02 NOTE — Progress Notes (Signed)
HPI The patient presents for evaluation reduced ejection fraction. I saw her in the office on 6/11.  She did have a heart catheterization years ago for evaluation of chest pain chest pain. I was able to review this. 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 She had a normal ejection fraction at that time. She has been noted to have a left bundle branch block and I don't see an old EKG for comparison. She was in the emergency room for chest discomfort in May and I reviewed these records. However, there was no objective evidence of ischemia. She was sent to Ginette OttoSTONEKING,HAL THOMAS, MD.  He did order an echocardiogram which demonstrated her ejection fraction was 35%.  There was moderate mitral regurgitation.  She continues to denies any symptoms that she has had chest discomfort since this episode. She denies chest pressure, neck or arm discomfort. She may have some mild shortness of breath occasionally. She's had no PND or orthopnea however. She has had no palpitations, presyncope or syncope.  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 However, this apparently did not need to be treated. Her blood pressure is elevated here but she reports that it is well controlled at home with readings typically in the 120s systolic.  Allergies  Allergen Reactions  . Hydrochlorothiazide Other (See Comments)    REACTION: hyponatremia  . Ramipril Other (See Comments)    REACTION: GI upset  . Remeron [Mirtazapine] Other (See Comments)    REACTION: weakness    Current Facility-Administered Medications  Medication Dose Route Frequency Provider Last Rate Last Dose  . 0.9 %  sodium chloride infusion  250 mL Intravenous PRN Rollene RotundaJames Shadonna Benedick, MD      . 0.9 %  sodium chloride infusion   Intravenous Continuous Rollene RotundaJames Rayane Gallardo, MD 50 mL/hr at 09/02/13 0929 1,000 mL at 09/02/13 0929  . aspirin chewable tablet 81 mg   81 mg Oral Once Rollene RotundaJames Sharlene Mccluskey, MD      . sodium chloride 0.9 % injection 3 mL  3 mL Intravenous Q12H Rollene RotundaJames Kyrus Hyde, MD      . sodium chloride 0.9 % injection 3 mL  3 mL Intravenous PRN Rollene RotundaJames Maizie Garno, MD        Past Medical History  Diagnosis Date  . LBBB (left bundle branch block)   . Anxiety   . Hypertension   . Hypothyroid     Past Surgical History  Procedure Laterality Date  . Abdominal hysterectomy    . Bladder surgery      Family History  Problem Relation Age of Onset  . Hypertension Mother   . Prostate cancer Father     History   Social History  . Marital Status: Widowed    Spouse Name: N/A    Number of Children: 2  . Years of Education: N/A   Occupational History  . Not on file.   Social History Main Topics  . Smoking status: Never Smoker   . Smokeless tobacco: Not on file  . Alcohol Use: Not on file  . Drug Use: Not on file  . Sexual Activity: Not on file   Other Topics Concern  . Not on file   Social History Narrative   Lives alone.      ROS:  As stated in the HPI and negative for all other systems.   PHYSICAL EXAM BP 177/77  Pulse 80  Temp(Src) 97.9 F (36.6 C) (Oral)  Resp 18  Ht 5\' 3"  (1.6 m)  Wt 135 lb (61.236 kg)  BMI 23.92 kg/m2  SpO2 94% GENERAL:  Well appearing HEENT:  Pupils equal round and reactive, fundi not visualized, oral mucosa unremarkable NECK:  No jugular venous distention, waveform within normal limits, carotid upstroke brisk and symmetric, no bruits, no thyromegaly LYMPHATICS:  No cervical, inguinal adenopathy LUNGS:  Clear to auscultation bilaterally BACK:  No CVA tenderness CHEST:  Unremarkable HEART:  PMI not displaced or sustained,S1 and S2 within normal limits, no S3, no S4, no clicks, no rubs, soft apical systolic murmur early peaking and short, no diastolic  murmurs ABD:  Flat, positive bowel sounds normal in frequency in pitch, no bruits, no rebound, no guarding, no midline pulsatile mass, no hepatomegaly,  no splenomegaly EXT:  2 plus pulses throughout, no edema, no cyanosis no clubbing, right femoral bruit SKIN:  No rashes no nodules NEURO:  Cranial nerves II through XII grossly intact, motor grossly intact throughout PSYCH:  Cognitively intact, oriented to person place and time   ASSESSMENT AND PLAN  CARDIOMYOPATHY:  She will need a cardiac cath to evaluate the etiology of her cardiomyopathy. I think this possibility that her hypertension has been more poorly controlled then she knows and this could be etiology as well. She will have renal angiogram at the time of her catheterization. Her blood pressure will be addressed as below.  The patient understands that risks included but are not limited to stroke (1 in 1000), death (1 in 1000), kidney failure [usually temporary] (1 in 500), bleeding (1 in 200), allergic reaction [possibly serious] (1 in 200).  The patient understands and agrees to proceed.   HTN:  I have asked her to bring in her blood pressure cuff to correlate this. She may need further adjustments. I will also titrating for management of her cardiomyopathy.  RAS:  Her most recent renal function appeared to be normal. However, I will evaluate as above.  MR:  This can be followed clinically.

## 2013-09-02 NOTE — CV Procedure (Signed)
   Cardiac Catheterization Procedure Note  Name: DAIZY CHARD MRN: [id number] DOB: 1932/11/02  Procedure: Left Heart Cath, Selective Coronary Angiography, LV angiography  Indication:    Procedural details: The right femoral was prepped, draped, and anesthetized with 1% lidocaine. Using modified Seldinger technique, a 5 French sheath was introduced into the right femoral artery. Standard Judkins catheters were used for coronary angiography and left ventriculography. The right coronary catheter was used to selectively cannulate the renal arteries.  Catheter exchanges were performed over a guidewire. There were no immediate procedural complications. The patient was transferred to the post catheterization recovery area for further monitoring.  Procedural Findings:   Hemodynamics:     AO 168/72    LV 168/4   Coronary angiography:   Coronary dominance: Right  Left mainstem:   NA  Left anterior descending (LAD):   Anomalous origin off the RCA.  Normal otherwise.    Left circumflex (LCx):  Essentially a very large branching RI normal throughout its course  Right coronary artery (RCA):  Very large.  Mild proximal luminal irregularities.  PDA large and normal.  PL moderate sized and normal.  LAD arises from the proximal RCA.  Left ventriculography: Left ventricular systolic function is mildly globally reduced, LVEF is estimated at 45, there is no significant mitral regurgitation   Left renal artery:  Two left renal arteries both widely patent.  Right renal artery:  Mild fibromuscular dysplasia.    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  Mild global LV dysfunction.    Recommendations:   Continue medical management.   [name] 09/02/2013, 11:23 AM

## 2013-09-02 NOTE — H&P (View-Only) (Signed)
 HPI The patient presents for evaluation reduced ejection fraction. I saw her in the office on 6/11.  She did have a heart catheterization years ago for evaluation of chest pain chest pain. I was able to review this. 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 She had a normal ejection fraction at that time. She has been noted to have a left bundle branch block and I don't see an old EKG for comparison. She was in the emergency room for chest discomfort in May and I reviewed these records. However, there was no objective evidence of ischemia. She was sent to STONEKING,HAL THOMAS, MD.  He did order an echocardiogram which demonstrated her ejection fraction was 35%.  There was moderate mitral regurgitation.  She continues to denies any symptoms that she has had chest discomfort since this episode. She denies chest pressure, neck or arm discomfort. She may have some mild shortness of breath occasionally. She's had no PND or orthopnea however. She has had no palpitations, presyncope or syncope.  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 However, this apparently did not need to be treated. Her blood pressure is elevated here but she reports that it is well controlled at home with readings typically in the 120s systolic.  Allergies  Allergen Reactions  . Hydrochlorothiazide Other (See Comments)    REACTION: hyponatremia  . Ramipril Other (See Comments)    REACTION: GI upset  . Remeron [Mirtazapine] Other (See Comments)    REACTION: weakness    Current Facility-Administered Medications  Medication Dose Route Frequency Provider Last Rate Last Dose  . 0.9 %  sodium chloride infusion  250 mL Intravenous PRN Leaann Nevils, MD      . 0.9 %  sodium chloride infusion   Intravenous Continuous Kadarrius Yanke, MD 50 mL/hr at 09/02/13 0929 1,000 mL at 09/02/13 0929  . aspirin chewable tablet 81 mg   81 mg Oral Once Gabrianna Fassnacht, MD      . sodium chloride 0.9 % injection 3 mL  3 mL Intravenous Q12H Quynh Basso, MD      . sodium chloride 0.9 % injection 3 mL  3 mL Intravenous PRN Floreen Teegarden, MD        Past Medical History  Diagnosis Date  . LBBB (left bundle branch block)   . Anxiety   . Hypertension   . Hypothyroid     Past Surgical History  Procedure Laterality Date  . Abdominal hysterectomy    . Bladder surgery      Family History  Problem Relation Age of Onset  . Hypertension Mother   . Prostate cancer Father     History   Social History  . Marital Status: Widowed    Spouse Name: N/A    Number of Children: 2  . Years of Education: N/A   Occupational History  . Not on file.   Social History Main Topics  . Smoking status: Never Smoker   . Smokeless tobacco: Not on file  . Alcohol Use: Not on file  . Drug Use: Not on file  . Sexual Activity: Not on file   Other Topics Concern  . Not on file   Social History Narrative   Lives alone.      ROS:  As stated in the HPI and negative for all other systems.   PHYSICAL EXAM BP 177/77  Pulse 80  Temp(Src) 97.9 F (36.6 C) (Oral)    Resp 18  Ht 5\' 3"  (1.6 m)  Wt 135 lb (61.236 kg)  BMI 23.92 kg/m2  SpO2 94% GENERAL:  Well appearing HEENT:  Pupils equal round and reactive, fundi not visualized, oral mucosa unremarkable NECK:  No jugular venous distention, waveform within normal limits, carotid upstroke brisk and symmetric, no bruits, no thyromegaly LYMPHATICS:  No cervical, inguinal adenopathy LUNGS:  Clear to auscultation bilaterally BACK:  No CVA tenderness CHEST:  Unremarkable HEART:  PMI not displaced or sustained,S1 and S2 within normal limits, no S3, no S4, no clicks, no rubs, soft apical systolic murmur early peaking and short, no diastolic  murmurs ABD:  Flat, positive bowel sounds normal in frequency in pitch, no bruits, no rebound, no guarding, no midline pulsatile mass, no hepatomegaly,  no splenomegaly EXT:  2 plus pulses throughout, no edema, no cyanosis no clubbing, right femoral bruit SKIN:  No rashes no nodules NEURO:  Cranial nerves II through XII grossly intact, motor grossly intact throughout PSYCH:  Cognitively intact, oriented to person place and time   ASSESSMENT AND PLAN  CARDIOMYOPATHY:  She will need a cardiac cath to evaluate the etiology of her cardiomyopathy. I think this possibility that her hypertension has been more poorly controlled then she knows and this could be etiology as well. She will have renal angiogram at the time of her catheterization. Her blood pressure will be addressed as below.  The patient understands that risks included but are not limited to stroke (1 in 1000), death (1 in 1000), kidney failure [usually temporary] (1 in 500), bleeding (1 in 200), allergic reaction [possibly serious] (1 in 200).  The patient understands and agrees to proceed.   HTN:  I have asked her to bring in her blood pressure cuff to correlate this. She may need further adjustments. I will also titrating for management of her cardiomyopathy.  RAS:  Her most recent renal function appeared to be normal. However, I will evaluate as above.  MR:  This can be followed clinically.

## 2013-09-02 NOTE — Interval H&P Note (Signed)
History and Physical Interval Note:  09/02/2013 10:56 AM  Autumn [name]  has presented today for surgery, with the diagnosis of CARDIOMYOPATHY AND HTN  The various methods of treatment have been discussed with the patient and family. After consideration of risks, benefits and other options for treatment, the patient has consented to  Procedure(s): LEFT HEART CATHETERIZATION WITH CORONARY ANGIOGRAM (N/A) RENAL ANGIOGRAM (N/A) as a surgical intervention .  The patient's history has been reviewed, patient examined, no change in status, stable for surgery.  I have reviewed the patient's chart and labs.  Questions were answered to the patient's satisfaction.     [name]

## 2013-09-02 NOTE — Progress Notes (Signed)
Discharge instruction given per MD order.  Pt and CG able to verbalize understanding.  Pt denies any discomfort at this time.  Pt to car via wheelchair.  

## 2013-09-19 ENCOUNTER — Ambulatory Visit (INDEPENDENT_AMBULATORY_CARE_PROVIDER_SITE_OTHER): Payer: Medicare Other | Admitting: Cardiology

## 2013-09-19 ENCOUNTER — Encounter: Payer: Self-pay | Admitting: Cardiology

## 2013-09-19 VITALS — BP 152/88 | HR 88 | Ht 63.0 in | Wt 135.8 lb

## 2013-09-19 DIAGNOSIS — I420 Dilated cardiomyopathy: Secondary | ICD-10-CM

## 2013-09-19 DIAGNOSIS — I428 Other cardiomyopathies: Secondary | ICD-10-CM

## 2013-09-19 DIAGNOSIS — Z0389 Encounter for observation for other suspected diseases and conditions ruled out: Secondary | ICD-10-CM

## 2013-09-19 DIAGNOSIS — I447 Left bundle-branch block, unspecified: Secondary | ICD-10-CM | POA: Insufficient documentation

## 2013-09-19 DIAGNOSIS — IMO0001 Reserved for inherently not codable concepts without codable children: Secondary | ICD-10-CM

## 2013-09-19 MED ORDER — CARVEDILOL 3.125 MG PO TABS: 3.1250 mg | ORAL_TABLET | Freq: Two times a day (BID) | ORAL | Status: DC

## 2013-09-19 MED ORDER — RAMIPRIL 2.5 MG PO CAPS: 2.5000 mg | ORAL_CAPSULE | Freq: Every day | ORAL | Status: DC

## 2013-09-19 MED ORDER — LISINOPRIL 5 MG PO TABS: 5.0000 mg | ORAL_TABLET | Freq: Every day | ORAL | Status: DC

## 2013-09-19 NOTE — Assessment & Plan Note (Addendum)
No CHF symptoms. EF 35% by echo May 2015- 45% at cath 09/02/13

## 2013-09-19 NOTE — Patient Instructions (Addendum)
Corine Shelter, PA-C has recommended making the following medication changes:  START Lisinopril 5 mg - take 1 tablet daily  START Coreg 3.125 mg - take 1 tablet twice daily  Your physician recommends that you schedule a follow-up appointment in 3 months with Dr Antoine Poche.

## 2013-09-19 NOTE — Progress Notes (Signed)
09/19/2013 Autumn [name]   Sep 13, 1932  [id number]  Primary Physicia Ginette Otto, MD Primary Cardiologist: Dr Antoine Poche  HPI:  80 y/o followed by Dr Pete Glatter. She had chest pain in May 2015. And was seen in the ER. EKG showed a LBBB. An echo done in May revealed an EF of 35%. She was referred to Dr Antoine Poche for further evaluation. She apparently has had a remote cath showing normal coronaries. It was decided to repeat her cath and this was done 09/02/13. 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 Her EF at cath was 45%. She is in the office today for follow up. She is no cardiac medication. She had been on Norvasc but stopped this because of some edema in her Rt ankle. She denies any unusual dyspnea or chest pain.    Current Outpatient Prescriptions  Medication Sig Dispense Refill  . cholecalciferol (VITAMIN D) 1000 UNITS tablet Take 1,000 Units by mouth daily.      Marland Kitchen CINNAMON PO Take 1 tablet by mouth daily.      Marland Kitchen estradiol (ESTRACE) 1 MG tablet Take 1 mg by mouth daily.      . Glucosamine HCl (GLUCOSAMINE PO) Take 1 tablet by mouth daily.      Marland Kitchen HYDROcodone-acetaminophen (NORCO/VICODIN) 5-325 MG per tablet Take 1 tablet by mouth daily as needed for moderate pain.      Marland Kitchen KRILL OIL PO Take 1 capsule by mouth daily.      Marland Kitchen levothyroxine (SYNTHROID, LEVOTHROID) 50 MCG tablet Take 50 mcg by mouth daily before breakfast.      . LORazepam (ATIVAN) 0.5 MG tablet Take 0.5 mg by mouth at bedtime.      Marland Kitchen MAGNESIUM PO Take 1 tablet by mouth daily.      Marland Kitchen MELATONIN PO Take 1 mL by mouth at bedtime.      . Multiple Vitamins-Minerals (MULTIVITAMIN PO) Take 1 tablet by mouth daily.      Marland Kitchen omeprazole (PRILOSEC) 20 MG capsule Take 20 mg by mouth daily as needed (for reflux).      . S-Adenosylmethionine (SAM-E PO) Take 1 tablet by mouth daily.      . TURMERIC CURCUMIN PO Take 1 tablet by mouth daily.       Marland Kitchen amLODipine (NORVASC) 2.5 MG tablet Take 2.5 mg by  mouth daily.      . carvedilol (COREG) 3.125 MG tablet Take 1 tablet (3.125 mg total) by mouth 2 (two) times daily with a meal.  60 tablet  11  . ramipril (ALTACE) 2.5 MG capsule Take 1 capsule (2.5 mg total) by mouth daily.  30 capsule  11   No current facility-administered medications for this visit.    Allergies  Allergen Reactions  . Hydrochlorothiazide Other (See Comments)    REACTION: hyponatremia  . Ramipril Other (See Comments)    REACTION: GI upset  . Remeron [Mirtazapine] Other (See Comments)    REACTION: weakness    History   Social History  . Marital Status: Widowed    Spouse Name: N/A    Number of Children: 2  . Years of Education: N/A   Occupational History  . Not on file.   Social History Main Topics  . Smoking status: Never Smoker   . Smokeless tobacco: Not on file  . Alcohol Use: Not on file  . Drug Use: Not on file  . Sexual Activity: Not on file   Other Topics Concern  .  Not on file   Social History Narrative   Lives alone.       Review of Systems: General: negative for chills, fever, night sweats or weight changes.  Cardiovascular: negative for chest pain, dyspnea on exertion, edema, orthopnea, palpitations, paroxysmal nocturnal dyspnea or shortness of breath Dermatological: negative for rash Respiratory: negative for cough or wheezing Urologic: negative for hematuria Abdominal: negative for nausea, vomiting, diarrhea, bright red blood per rectum, melena, or hematemesis Neurologic: negative for visual changes, syncope, or dizziness All other systems reviewed and are otherwise negative except as noted above.    Blood pressure 152/88, pulse 88, height 5\' 3"  (1.6 m), weight 135 lb 12.8 oz (61.598 kg).  General appearance: alert, cooperative and no distress Neck: no carotid bruit and no JVD Lungs: clear to auscultation bilaterally Heart: regular rate and rhythm Extremities: no edema   ASSESSMENT AND PLAN:   Congestive dilated  cardiomyopathy No CHF symptoms. 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    PLAN  I suggested starting an ACE daily and Coreg 3.125 mg BID for her cardiomyopathy. I get the sense she is sensitive to prescription medication. She says she had abdominal pain with Ramipril in the past. We'll start with a low dose of Lisinopril and Coreg, and start one at a time- the Lisinopril first. She will follow up with Dr Antoine PocheHochrein in 3 months.   Escher Harr KPA-C 09/19/2013 2:04 PM

## 2013-09-19 NOTE — Assessment & Plan Note (Signed)
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

## 2013-10-09 ENCOUNTER — Encounter: Payer: Self-pay | Admitting: Cardiology

## 2013-10-09 ENCOUNTER — Telehealth: Payer: Self-pay | Admitting: Cardiology

## 2013-10-09 NOTE — Telephone Encounter (Signed)
Pt called in stating that she is having some side effects Carvedilol. She stated that she is having an upset stomach, low appetite, pain in the back of her neck..etc. She would like to discuss being put on another medication. Please call  Thanks

## 2013-10-10 ENCOUNTER — Encounter: Payer: Self-pay | Admitting: Cardiology

## 2013-10-10 NOTE — Telephone Encounter (Signed)
Pt is calling again,said nobody have called her.

## 2013-10-10 NOTE — Telephone Encounter (Signed)
Can I close this encounter?

## 2013-10-10 NOTE — Telephone Encounter (Signed)
Autumn [name] stated that he hasnt not yet received a answer from Dr. Antoine Poche yet and when he does he will call her

## 2013-10-10 NOTE — Telephone Encounter (Signed)
Pt.  Having side effects from Carvedilol wants to change to something else

## 2013-10-11 NOTE — Telephone Encounter (Signed)
We can stop the carvedilol.  Is she willing to try a low dose of Toprol XL 25 mg daily?  If so one PO daily, disp number 30 with 11 refills.

## 2013-10-13 NOTE — Telephone Encounter (Signed)
Pt. Called and informed ; but pt. Said she wanted to go on without a beta blocker

## 2013-10-13 NOTE — Telephone Encounter (Signed)
Pt was returning Dr. Jenene Slicker call in regards to switching medications. Please call  Thanks

## 2013-10-25 ENCOUNTER — Encounter: Payer: Self-pay | Admitting: Cardiology

## 2013-11-28 ENCOUNTER — Other Ambulatory Visit: Payer: Self-pay

## 2013-12-05 ENCOUNTER — Encounter: Payer: Self-pay | Admitting: Cardiology

## 2013-12-22 ENCOUNTER — Encounter: Payer: Self-pay | Admitting: Cardiology

## 2013-12-22 ENCOUNTER — Ambulatory Visit (INDEPENDENT_AMBULATORY_CARE_PROVIDER_SITE_OTHER): Payer: Medicare Other | Admitting: Cardiology

## 2013-12-22 VITALS — BP 156/72 | HR 92 | Ht 63.0 in | Wt 137.2 lb

## 2013-12-22 DIAGNOSIS — I447 Left bundle-branch block, unspecified: Secondary | ICD-10-CM

## 2013-12-22 DIAGNOSIS — I739 Peripheral vascular disease, unspecified: Secondary | ICD-10-CM

## 2013-12-22 MED ORDER — CARVEDILOL 3.125 MG PO TABS: 3.1250 mg | ORAL_TABLET | Freq: Two times a day (BID) | ORAL | Status: DC

## 2013-12-22 NOTE — Patient Instructions (Signed)
Your physician has requested that you have a lower extremity venous duplex. This test is an ultrasound of the veins in the legs or arms. It looks at venous blood flow that carries blood from the heart to the legs or arms. Allow one hour for a Lower Venous exam. Allow thirty minutes for an Upper Venous exam. There are no restrictions or special instructions.  RESTART Carvedilol 3.125mg  twice a day.  Your physician recommends that you schedule a follow-up appointment in: 4 weeks with Dr. Antoine Poche.

## 2013-12-22 NOTE — Progress Notes (Signed)
HPI The patient presents follow up of a cardiomyopathy.  She was in the emergency room for chest discomfort in May. However, there was no objective evidence of ischemia. She was sent to Ginette Otto, MD.  He did order an echocardiogram which demonstrated her ejection fraction was 35%.  There was moderate mitral regurgitation.  I performed a cardiac catheterization and she had patent renal arteries although there was probably some mild fibromuscular dysplasia on the right.  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 We saw her back in followup in tried to start low-dose ACE inhibitor which cause stomach upset. She also said low-dose beta blocker cause stomach upset. She returns for followup. She does have a mild cough nonproductive. She's not describing fevers or chills. She's not had any palpitations, presyncope or syncope. She does have right lower extremity swelling which she says is worse than usual. She mentions having had this in the past she sometimes wears compression stockings.  Allergies  Allergen Reactions  . Hydrochlorothiazide Other (See Comments)    REACTION: hyponatremia  . Ramipril Other (See Comments)    REACTION: GI upset  . Remeron [Mirtazapine] Other (See Comments)    REACTION: weakness    Current Outpatient Prescriptions  Medication Sig Dispense Refill  . amLODipine (NORVASC) 2.5 MG tablet Take 2.5 mg by mouth daily.    . cholecalciferol (VITAMIN D) 1000 UNITS tablet Take 1,000 Units by mouth daily.    Marland Kitchen CINNAMON PO Take 1 tablet by mouth daily.    Marland Kitchen estradiol (ESTRACE) 1 MG tablet Take 1 mg by mouth daily.    . Glucosamine HCl (GLUCOSAMINE PO) Take 1 tablet by mouth daily.    Marland Kitchen HYDROcodone-acetaminophen (NORCO/VICODIN) 5-325 MG per tablet Take 1 tablet by mouth daily as needed for moderate pain.    Marland Kitchen KRILL OIL PO Take 1 capsule by mouth daily.    Marland Kitchen levothyroxine (SYNTHROID, LEVOTHROID) 50 MCG  tablet Take 50 mcg by mouth daily before breakfast.    . LORazepam (ATIVAN) 0.5 MG tablet Take 0.5 mg by mouth at bedtime.    Marland Kitchen MAGNESIUM PO Take 1 tablet by mouth daily.    Marland Kitchen MELATONIN PO Take 1 mL by mouth at bedtime.    . Multiple Vitamins-Minerals (MULTIVITAMIN PO) Take 1 tablet by mouth daily.    Marland Kitchen omeprazole (PRILOSEC) 20 MG capsule Take 20 mg by mouth daily as needed (for reflux).    . S-Adenosylmethionine (SAM-E PO) Take 1 tablet by mouth daily.    . TURMERIC CURCUMIN PO Take 1 tablet by mouth daily.      No current facility-administered medications for this visit.    Past Medical History  Diagnosis Date  . LBBB (left bundle branch block)   . Anxiety   . Hypertension   . Hypothyroid     Past Surgical History  Procedure Laterality Date  . Abdominal hysterectomy    . Bladder surgery      ROS:  She mentions again back and knee pain, constipation.  Otherwise as stated in the HPI and negative for all other systems.   PHYSICAL EXAM BP 156/72 mmHg  Pulse 92  Ht 5\' 3"  (1.6 m)  Wt 137 lb 3.2 oz (62.234 kg)  BMI 24.31 kg/m2 GENERAL:  Well appearing NECK:  No jugular venous distention, waveform within normal limits, carotid upstroke brisk and symmetric, no bruits, no thyromegaly LUNGS:  Clear to auscultation bilaterally CHEST:  Unremarkable HEART:  PMI not displaced or sustained,S1 and S2 within normal limits, no S3, no S4, no clicks, no rubs, soft apical systolic murmur early peaking and short, no diastolic  murmurs ABD:  Flat, positive bowel sounds normal in frequency in pitch, no bruits, no rebound, no guarding, no midline pulsatile mass, no hepatomegaly, no splenomegaly EXT:  2 plus pulses throughout, right edema below the knee, no left leg edema, no cyanosis no clubbing, right femoral bruit SKIN:  No rashes no nodules NEURO:  Cranial nerves II through XII grossly intact, motor grossly intact throughout PSYCH:  Cognitively intact, oriented to person place and  time   EKG:   NSR, rate 92, axis WNL, first degree AV block.  No acute ST T wave changes. LBBB  12/22/2013  ASSESSMENT AND PLAN  CARDIOMYOPATHY:  Nonischemic.  I will try again to start a very low dose beta blocker. She agrees to try this.  HTN:  We will manage this in the context of titrating her medications for cardiomyopathy.  MR:  This can be followed clinically and with echocardiography in the future.  EDEMA:  She has unilateral leg edema. I will check venous Dopplers although I think the likelihood of thrombosis is low.

## 2013-12-24 ENCOUNTER — Ambulatory Visit (HOSPITAL_COMMUNITY)
Admission: RE | Admit: 2013-12-24 | Discharge: 2013-12-24 | Disposition: A | Discharge status: Home / Self Care | Payer: Medicare Other | Source: Ambulatory Visit | Attending: Cardiovascular Disease | Admitting: Cardiovascular Disease

## 2013-12-24 DIAGNOSIS — M7989 Other specified soft tissue disorders: Secondary | ICD-10-CM | POA: Diagnosis present

## 2013-12-24 DIAGNOSIS — I739 Peripheral vascular disease, unspecified: Secondary | ICD-10-CM | POA: Insufficient documentation

## 2013-12-24 NOTE — Progress Notes (Signed)
Right Lower Extremity Venous Duplex Completed. °Brianna L Mazza,RVT °

## 2013-12-29 ENCOUNTER — Encounter: Payer: Self-pay | Admitting: Cardiology

## 2014-01-22 ENCOUNTER — Encounter (HOSPITAL_COMMUNITY): Payer: Self-pay | Admitting: Cardiology

## 2014-01-26 ENCOUNTER — Encounter: Payer: Self-pay | Admitting: Cardiology

## 2014-01-26 ENCOUNTER — Ambulatory Visit (INDEPENDENT_AMBULATORY_CARE_PROVIDER_SITE_OTHER): Payer: Medicare Other | Admitting: Cardiology

## 2014-01-26 VITALS — BP 138/60 | HR 66 | Ht 63.0 in | Wt 141.3 lb

## 2014-01-26 DIAGNOSIS — I429 Cardiomyopathy, unspecified: Secondary | ICD-10-CM

## 2014-01-26 DIAGNOSIS — I428 Other cardiomyopathies: Secondary | ICD-10-CM

## 2014-01-26 MED ORDER — CARVEDILOL 6.25 MG PO TABS: 6.2500 mg | ORAL_TABLET | Freq: Two times a day (BID) | ORAL | Status: DC

## 2014-01-26 NOTE — Patient Instructions (Addendum)
Your physician recommends that you schedule a follow-up appointment in: 6 weeks with Dr. Antoine Poche  Change your Carvedilol to 6.25 mg two times a day

## 2014-01-26 NOTE — Progress Notes (Signed)
HPI The patient presents follow up of a cardiomyopathy.  She has an ejection fraction was 35%.  There was moderate mitral regurgitation.  I performed a cardiac catheterization and she had patent renal arteries although there was probably some mild fibromuscular dysplasia on the right.  A LAD was anomalous off of the right coronary artery but she otherwise had no obstructive coronary disease.ACE inhibitor which cause stomach upset. She also said low-dose beta blocker cause stomach upset. However, she agreed to try this at the last visit.  She says that she is limited by knee and leg pain.   She did tolerate the beta blocker.  It did not cause stomach upset.  The patient denies any new symptoms such as chest discomfort, neck or arm discomfort. There has been no new shortness of breath, PND or orthopnea. There have been no reported palpitations, presyncope or syncope.  She does have continued lower extremity leg edema.    Allergies  Allergen Reactions  . Hydrochlorothiazide Other (See Comments)    REACTION: hyponatremia  . Ramipril Other (See Comments)    REACTION: GI upset  . Remeron [Mirtazapine] Other (See Comments)    REACTION: weakness    Current Outpatient Prescriptions  Medication Sig Dispense Refill  . amLODipine (NORVASC) 2.5 MG tablet Take 2.5 mg by mouth daily.    . carvedilol (COREG) 3.125 MG tablet Take 1 tablet (3.125 mg total) by mouth 2 (two) times daily with a meal. 60 tablet 11  . CINNAMON PO Take 1 tablet by mouth daily.    Marland Kitchen estradiol (ESTRACE) 1 MG tablet Take 1 mg by mouth daily.    . Glucosamine HCl (GLUCOSAMINE PO) Take 1 tablet by mouth daily.    Marland Kitchen HYDROcodone-acetaminophen (NORCO/VICODIN) 5-325 MG per tablet Take 1 tablet by mouth daily as needed for moderate pain.    Marland Kitchen KRILL OIL PO Take 1 capsule by mouth daily.    Marland Kitchen levothyroxine (SYNTHROID, LEVOTHROID) 50 MCG tablet Take 50 mcg by mouth daily before breakfast.    . LORazepam (ATIVAN) 0.5 MG tablet Take 0.5 mg  by mouth at bedtime.    Marland Kitchen MAGNESIUM PO Take 1 tablet by mouth daily.    Marland Kitchen MELATONIN PO Take 1 mL by mouth at bedtime.    Marland Kitchen omeprazole (PRILOSEC) 20 MG capsule Take 20 mg by mouth daily as needed (for reflux).    . S-Adenosylmethionine (SAM-E PO) Take 1 tablet by mouth daily.    . TURMERIC CURCUMIN PO Take 1 tablet by mouth daily.      No current facility-administered medications for this visit.    Past Medical History  Diagnosis Date  . LBBB (left bundle branch block)   . Anxiety   . Hypertension   . Hypothyroid     Past Surgical History  Procedure Laterality Date  . Abdominal hysterectomy    . Bladder surgery    . Left heart catheterization with coronary angiogram N/A 09/02/2013    Procedure: LEFT HEART CATHETERIZATION WITH CORONARY ANGIOGRAM;  Surgeon: [name], MD;  Location: [facility] CATH LAB;  Service: Cardiovascular;  Laterality: N/A;  . Renal angiogram N/A 09/02/2013    Procedure: RENAL ANGIOGRAM;  Surgeon: [name], MD;  Location: [facility] CATH LAB;  Service: Cardiovascular;  Laterality: N/A;    ROS:  As stated in the HPI and negative for all other systems.   PHYSICAL EXAM BP 138/60 mmHg  Pulse 66  Ht 5\' 3"  (1.6 m)  Wt 141 lb 4.8 oz (64.093 kg)  BMI 25.04 kg/m2 GENERAL:  Well appearing NECK:  No jugular venous distention, waveform within normal limits, carotid upstroke brisk and symmetric, no bruits, no thyromegaly LUNGS:  Clear to auscultation bilaterally CHEST:  Unremarkable HEART:  PMI not displaced or sustained,S1 and S2 within normal limits, no S3, no S4, no clicks, no rubs, soft apical systolic murmur early peaking and short, no diastolic  murmurs ABD:  Flat, positive bowel sounds normal in frequency in pitch, no bruits, no rebound, no guarding, no midline pulsatile mass, no hepatomegaly, no splenomegaly EXT:  2 plus pulses throughout, right edema below the knee, no left leg edema, no cyanosis no clubbing, right femoral bruit   ASSESSMENT AND  PLAN  CARDIOMYOPATHY:  Nonischemic.  I will increase the beta blocker slightly.    HTN:  We will manage this in the context of titrating her medications for cardiomyopathy.  MR:  This can be followed clinically and with echocardiography in the future.  It was mild to moderate in May.    EDEMA:  She has unilateral leg edema.   Venous Dopplers were negative.  We have discussed conservative therapies.

## 2014-03-12 ENCOUNTER — Telehealth: Payer: Self-pay | Admitting: Cardiology

## 2014-03-12 NOTE — Telephone Encounter (Signed)
Received records from [facility] (Dr Merlene Laughter) for appointment on 04/10/14 with Dr Antoine Poche.  Records given to [facility] (medical records) for Dr Hochrein's schedule on 04/10/14.  lp

## 2014-04-10 ENCOUNTER — Ambulatory Visit (INDEPENDENT_AMBULATORY_CARE_PROVIDER_SITE_OTHER): Payer: Medicare Other | Admitting: Cardiology

## 2014-04-10 VITALS — BP 140/80 | HR 72 | Ht 63.0 in | Wt 145.0 lb

## 2014-04-10 DIAGNOSIS — R0989 Other specified symptoms and signs involving the circulatory and respiratory systems: Secondary | ICD-10-CM

## 2014-04-10 DIAGNOSIS — I429 Cardiomyopathy, unspecified: Secondary | ICD-10-CM

## 2014-04-10 NOTE — Patient Instructions (Signed)
Your physician recommends that you schedule a follow-up appointment in: may 2016  Before the visit in may we will get an echo and a carotid doppler

## 2014-04-10 NOTE — Progress Notes (Signed)
HPI The patient presents follow up of a cardiomyopathy.  She has an ejection fraction was 35%.  There was moderate mitral regurgitation.  I performed a cardiac catheterization and she had patent renal arteries although there was probably some mild fibromuscular dysplasia on the right.  A LAD was anomalous off of the right coronary artery but she otherwise had no obstructive coronary disease.ACE inhibitor which cause stomach upset.      Recently she had some lower extremity swelling although she has had chronic right lower extremity edema. Her weight was up a little bit. Ginette Otto, MD added some Lasix. He also took her off Norvasc and started Cozaar which she has uptitrated. He scheduled her for follow-up. She actually doesn't say that she's had any acute cardiovascular complaints. She denies any chest pressure , neck or arm discomfort. She's had no palpitations, presyncope or syncope. She's had no weight gain by her scales.   Allergies  Allergen Reactions  . Hydrochlorothiazide Other (See Comments)    REACTION: hyponatremia  . Ramipril Other (See Comments)    REACTION: GI upset  . Remeron [Mirtazapine] Other (See Comments)    REACTION: weakness    Current Outpatient Prescriptions  Medication Sig Dispense Refill  . carvedilol (COREG) 6.25 MG tablet Take 1 tablet (6.25 mg total) by mouth 2 (two) times daily. 180 tablet 3  . CINNAMON PO Take 1 tablet by mouth daily.    Marland Kitchen estradiol (ESTRACE) 1 MG tablet Take 1 mg by mouth daily.    . furosemide (LASIX) 20 MG tablet Take 20 mg by mouth daily.    . Glucosamine HCl (GLUCOSAMINE PO) Take 1 tablet by mouth daily.    Marland Kitchen HYDROcodone-acetaminophen (NORCO/VICODIN) 5-325 MG per tablet Take 1 tablet by mouth daily as needed for moderate pain.    Marland Kitchen KRILL OIL PO Take 1 capsule by mouth daily.    Marland Kitchen levothyroxine (SYNTHROID, LEVOTHROID) 50 MCG tablet Take 50 mcg by mouth daily before breakfast.    . LORazepam (ATIVAN) 0.5 MG tablet Take 0.5 mg  by mouth at bedtime.    Marland Kitchen losartan (COZAAR) 50 MG tablet Take 50 mg by mouth daily.    Marland Kitchen MAGNESIUM PO Take 1 tablet by mouth daily.    Marland Kitchen MELATONIN PO Take 1 mL by mouth at bedtime.    Marland Kitchen omeprazole (PRILOSEC) 20 MG capsule Take 20 mg by mouth daily as needed (for reflux).    . S-Adenosylmethionine (SAM-E PO) Take 1 tablet by mouth daily.    . TURMERIC CURCUMIN PO Take 1 tablet by mouth daily.      No current facility-administered medications for this visit.    Past Medical History  Diagnosis Date  . LBBB (left bundle branch block)   . Anxiety   . Hypertension   . Hypothyroid     Past Surgical History  Procedure Laterality Date  . Abdominal hysterectomy    . Bladder surgery    . Left heart catheterization with coronary angiogram N/A 09/02/2013    Procedure: LEFT HEART CATHETERIZATION WITH CORONARY ANGIOGRAM;  Surgeon: [name], MD;  Location: [facility] CATH LAB;  Service: Cardiovascular;  Laterality: N/A;  . Renal angiogram N/A 09/02/2013    Procedure: RENAL ANGIOGRAM;  Surgeon: [name], MD;  Location: [facility] CATH LAB;  Service: Cardiovascular;  Laterality: N/A;    ROS:  As stated in the HPI and negative for all other systems.   PHYSICAL EXAM BP 164/60 mmHg  Ht 5\' 3"  (1.6 m)  Wt 145  lb (65.772 kg)  BMI 25.69 kg/m2 GENERAL:  Well appearing NECK:  No jugular venous distention, waveform within normal limits, carotid upstroke brisk and symmetric, right bruit, no thyromegaly LUNGS:  Clear to auscultation bilaterally CHEST:  Unremarkable HEART:  PMI not displaced or sustained,S1 and S2 within normal limits, no S3, no S4, no clicks, no rubs, soft apical systolic murmur early peaking and short, no diastolic  murmurs ABD:  Flat, positive bowel sounds normal in frequency in pitch, no bruits, no rebound, no guarding, no midline pulsatile mass, no hepatomegaly, no splenomegaly EXT:  2 plus pulses throughout, right edema below the knee, no left leg edema, no cyanosis no clubbing, right  femoral bruit   ASSESSMENT AND PLAN  CARDIOMYOPATHY:  Nonischemic.  I will follow up with an echo in May.   She can continue to have her Cozaar titrated.  HTN:  We will manage this in the context of titrating her medications for cardiomyopathy.  MR:  This can be followed clinically and with echocardiography in May.    BRUIT:  She will get a Doppler.

## 2014-04-12 ENCOUNTER — Encounter: Payer: Self-pay | Admitting: Cardiology

## 2014-06-23 ENCOUNTER — Other Ambulatory Visit: Payer: Self-pay

## 2014-06-23 MED ORDER — CARVEDILOL 6.25 MG PO TABS: 6.2500 mg | ORAL_TABLET | Freq: Two times a day (BID) | ORAL | Status: DC

## 2014-06-24 ENCOUNTER — Ambulatory Visit (HOSPITAL_COMMUNITY)
Admission: RE | Admit: 2014-06-24 | Discharge: 2014-06-24 | Disposition: A | Discharge status: Home / Self Care | Payer: Medicare Other | Source: Ambulatory Visit | Attending: Cardiology | Admitting: Cardiology

## 2014-06-24 ENCOUNTER — Ambulatory Visit (HOSPITAL_BASED_OUTPATIENT_CLINIC_OR_DEPARTMENT_OTHER)
Admission: RE | Admit: 2014-06-24 | Discharge: 2014-06-24 | Disposition: A | Discharge status: Home / Self Care | Payer: Medicare Other | Source: Ambulatory Visit | Attending: Cardiology | Admitting: Cardiology

## 2014-06-24 DIAGNOSIS — I10 Essential (primary) hypertension: Secondary | ICD-10-CM | POA: Diagnosis not present

## 2014-06-24 DIAGNOSIS — I255 Ischemic cardiomyopathy: Secondary | ICD-10-CM | POA: Insufficient documentation

## 2014-06-24 DIAGNOSIS — R0989 Other specified symptoms and signs involving the circulatory and respiratory systems: Secondary | ICD-10-CM

## 2014-06-24 DIAGNOSIS — I429 Cardiomyopathy, unspecified: Secondary | ICD-10-CM

## 2014-07-01 ENCOUNTER — Encounter (HOSPITAL_COMMUNITY): Payer: Medicare Other

## 2014-07-01 ENCOUNTER — Other Ambulatory Visit (HOSPITAL_COMMUNITY): Payer: Medicare Other

## 2014-07-01 ENCOUNTER — Ambulatory Visit (HOSPITAL_COMMUNITY): Payer: Medicare Other

## 2014-07-10 ENCOUNTER — Ambulatory Visit (INDEPENDENT_AMBULATORY_CARE_PROVIDER_SITE_OTHER): Payer: Medicare Other | Admitting: Cardiology

## 2014-07-10 ENCOUNTER — Encounter: Payer: Self-pay | Admitting: Cardiology

## 2014-07-10 VITALS — BP 169/76 | HR 72 | Ht 63.0 in | Wt 149.0 lb

## 2014-07-10 DIAGNOSIS — I420 Dilated cardiomyopathy: Secondary | ICD-10-CM | POA: Diagnosis not present

## 2014-07-10 NOTE — Progress Notes (Signed)
HPI The patient presents follow up of a cardiomyopathy.  She has an ejection fraction was 35%.  There was moderate mitral regurgitation.  I performed a cardiac catheterization and she had patent renal arteries although there was probably some mild fibromuscular dysplasia on the right.  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  follow-up echocardiography done recently actually demonstrated that her ejection fraction was now normal. She returns for follow-up. She's had no new shortness of breath, PND or orthopnea. She's had chronic right leg edema which is unchanged and for which we did an ultrasound in the fall. There was no evidence of DVT. Her only complaint is leg fatigue and tiredness.   Allergies  Allergen Reactions  . Hydrochlorothiazide Other (See Comments)    REACTION: hyponatremia  . Ramipril Other (See Comments)    REACTION: GI upset  . Remeron [Mirtazapine] Other (See Comments)    REACTION: weakness    Current Outpatient Prescriptions  Medication Sig Dispense Refill  . carvedilol (COREG) 6.25 MG tablet Take 1 tablet (6.25 mg total) by mouth 2 (two) times daily. 180 tablet 3  . CINNAMON PO Take 1 tablet by mouth daily.    Marland Kitchen estradiol (ESTRACE) 1 MG tablet Take 1 mg by mouth daily.    . Glucosamine HCl (GLUCOSAMINE PO) Take 1 tablet by mouth daily.    Marland Kitchen HYDROcodone-acetaminophen (NORCO/VICODIN) 5-325 MG per tablet Take 1 tablet by mouth daily as needed for moderate pain.    Marland Kitchen KRILL OIL PO Take 1 capsule by mouth daily.    Marland Kitchen levothyroxine (SYNTHROID, LEVOTHROID) 50 MCG tablet Take 50 mcg by mouth daily before breakfast.    . LORazepam (ATIVAN) 0.5 MG tablet Take 0.5 mg by mouth at bedtime.    Marland Kitchen losartan (COZAAR) 50 MG tablet Take 50 mg by mouth daily.    Marland Kitchen MAGNESIUM PO Take 1 tablet by mouth daily.    Marland Kitchen MELATONIN PO Take 1 mL by mouth at bedtime.    Marland Kitchen omeprazole (PRILOSEC) 20 MG capsule Take 20 mg by mouth daily  as needed (for reflux).    . S-Adenosylmethionine (SAM-E PO) Take 1 tablet by mouth daily.    . TURMERIC CURCUMIN PO Take 1 tablet by mouth daily.      No current facility-administered medications for this visit.    Past Medical History  Diagnosis Date  . LBBB (left bundle branch block)   . Anxiety   . Hypertension   . Hypothyroid     Past Surgical History  Procedure Laterality Date  . Abdominal hysterectomy    . Bladder surgery    . Left heart catheterization with coronary angiogram N/A 09/02/2013    Procedure: LEFT HEART CATHETERIZATION WITH CORONARY ANGIOGRAM;  Surgeon: [name], MD;  Location: [facility] CATH LAB;  Service: Cardiovascular;  Laterality: N/A;  . Renal angiogram N/A 09/02/2013    Procedure: RENAL ANGIOGRAM;  Surgeon: [name], MD;  Location: [facility] CATH LAB;  Service: Cardiovascular;  Laterality: N/A;    ROS:  As stated in the HPI and negative for all other systems.   PHYSICAL EXAM BP 169/76 mmHg  Pulse 72  Ht 5\' 3"  (1.6 m)  Wt 149 lb (67.586 kg)  BMI 26.40 kg/m2 GENERAL:  Well appearing NECK:  No jugular venous distention, waveform within normal limits, carotid upstroke brisk and symmetric, right bruit, no thyromegaly LUNGS:  Clear to auscultation bilaterally CHEST:  Unremarkable HEART:  PMI not displaced  or sustained,S1 and S2 within normal limits, no S3, no S4, no clicks, no rubs, soft apical systolic murmur early peaking and short, no diastolic  murmurs ABD:  Flat, positive bowel sounds normal in frequency in pitch, no bruits, no rebound, no guarding, no midline pulsatile mass, no hepatomegaly, no splenomegaly EXT:  2 plus pulses throughout, right edema below the knee, no left leg edema, no cyanosis no clubbing, right femoral bruit  EKG:  Sinus rhythm, rate 72, left bundle branch block, no acute ST-T wave changes.  07/10/2014  ASSESSMENT AND PLAN  CARDIOMYOPATHY:  Nonischemic.  Given the improved ejection fraction no further workup is indicated. I  would continue the medicines as listed.  HTN:  We will manage this in the context of titrating her medications for cardiomyopathy.  Her blood pressure was elevated today but her blood pressure diary at home demonstrates it to be normal.  MR:  This was only mild on the recent echo.  BRUIT: She had no disease on carotid Doppler.

## 2014-07-10 NOTE — Patient Instructions (Signed)
Your physician recommends that you schedule a follow-up appointment in: as needed with Dr. Hochrein  

## 2014-08-10 ENCOUNTER — Other Ambulatory Visit: Payer: Self-pay

## 2016-02-09 ENCOUNTER — Other Ambulatory Visit: Payer: Self-pay | Admitting: Geriatric Medicine

## 2016-02-09 ENCOUNTER — Ambulatory Visit
Admission: RE | Admit: 2016-02-09 | Discharge: 2016-02-09 | Disposition: A | Discharge status: Home / Self Care | Payer: Medicare Other | Source: Ambulatory Visit | Attending: Geriatric Medicine | Admitting: Geriatric Medicine

## 2016-02-09 DIAGNOSIS — R609 Edema, unspecified: Secondary | ICD-10-CM

## 2016-02-09 IMAGING — US US EXTREM LOW VENOUS*L*
1 series · 13 of 24 positions shown · non-contrast
Comparison: None.

CLINICAL DATA: Lower extremity edema

EXAM:
LEFT LOWER EXTREMITY VENOUS DUPLEX ULTRASOUND
TECHNIQUE: Gray-scale sonography with graded compression, as well as color
Doppler and duplex ultrasound were performed to evaluate the left
lower extremity deep venous system from the level of the common
femoral vein and including the common femoral, femoral, profunda
femoral, popliteal and calf veins including the posterior tibial,
peroneal and gastrocnemius veins when visible. The superficial great
saphenous vein was also interrogated. Spectral Doppler was utilized
to evaluate flow at rest and with distal augmentation maneuvers in
the common femoral, femoral and popliteal veins.

[Series 1: us extrem low venous*left* · 13 of 42 slices shown]
[im 1/42]
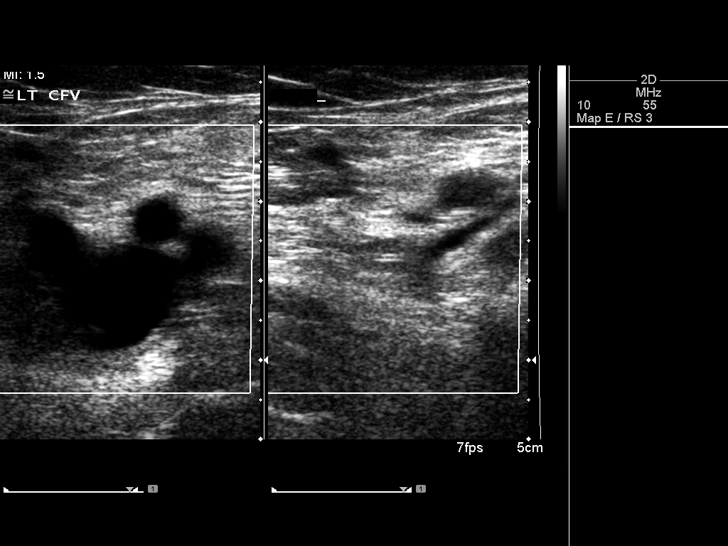
[im 4/42]
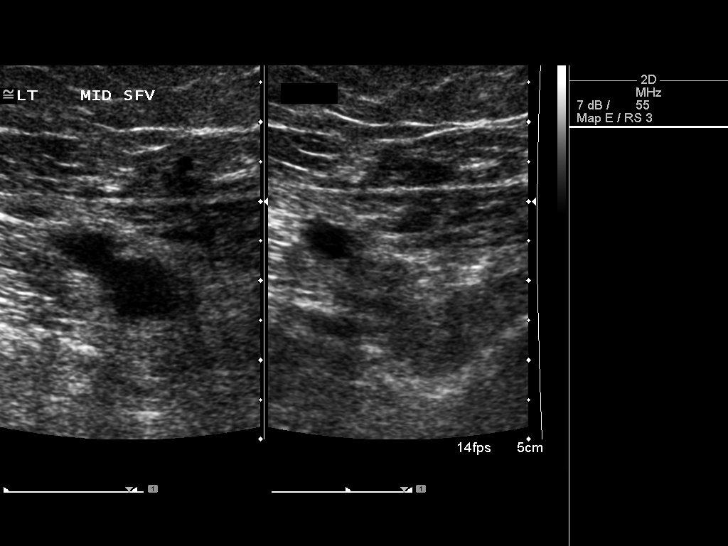
[im 8/42]
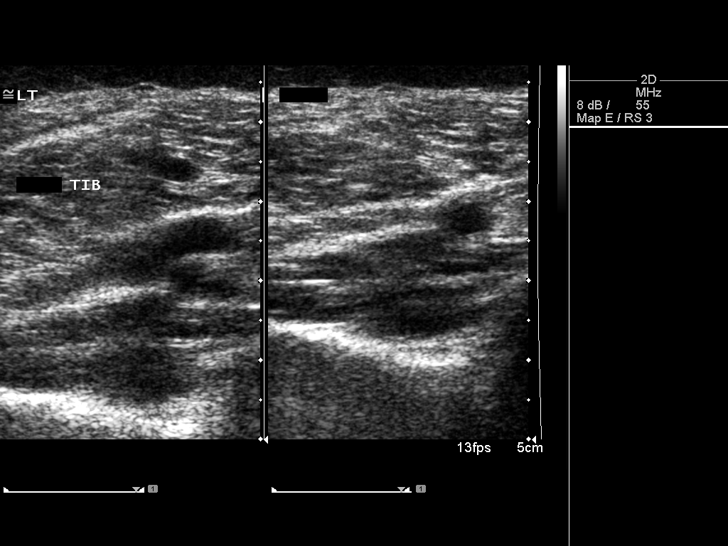
[im 11/42]
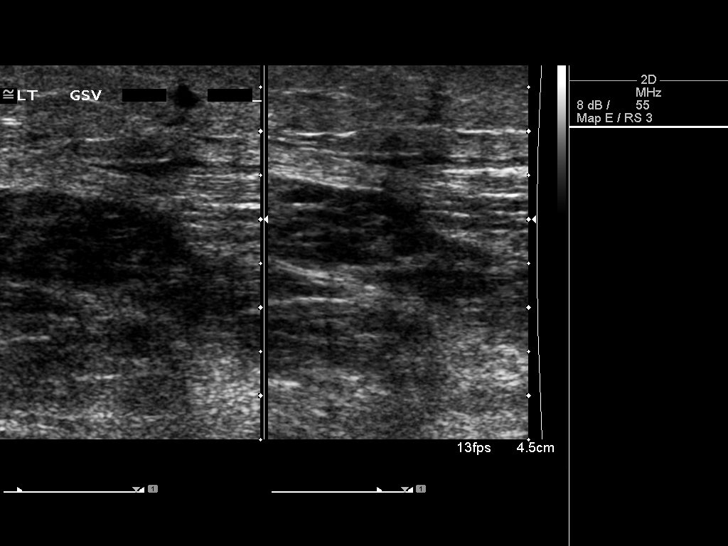
[im 15/42]
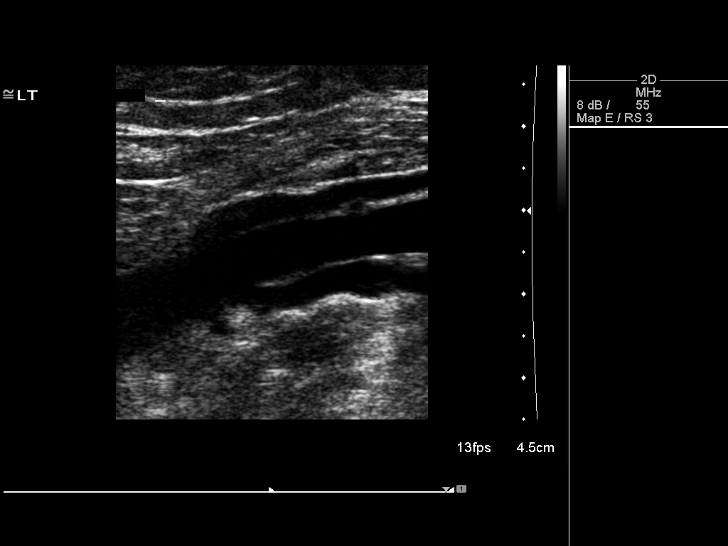
[im 18/42]
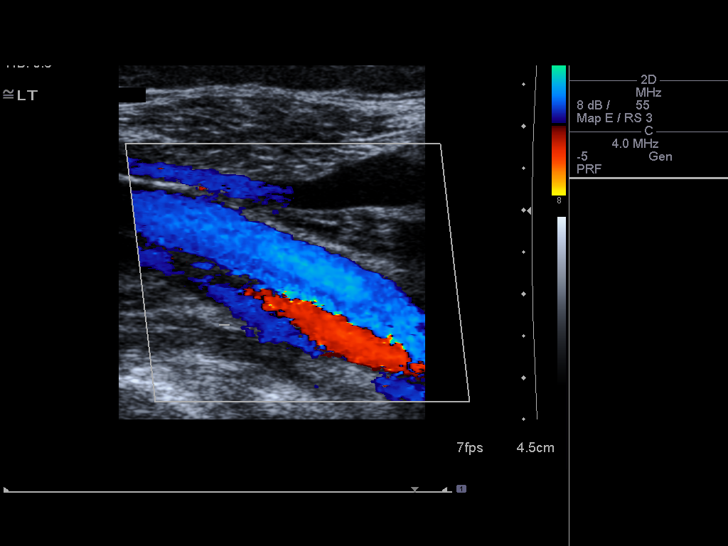
[im 22/42]
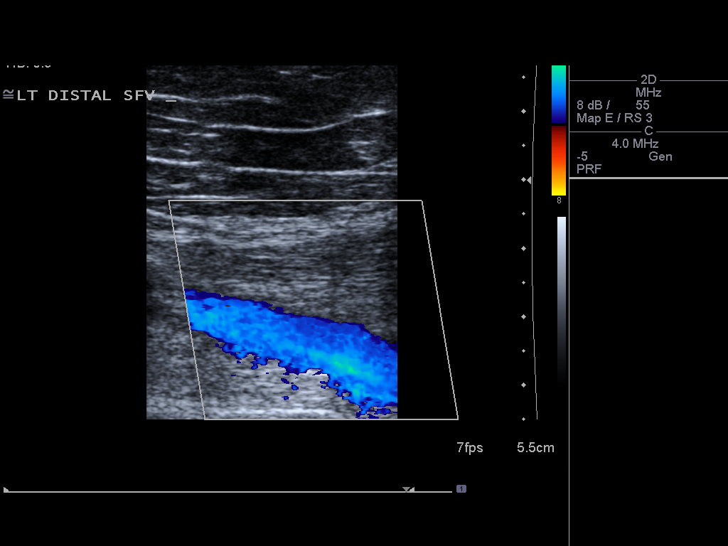
[im 24/42]
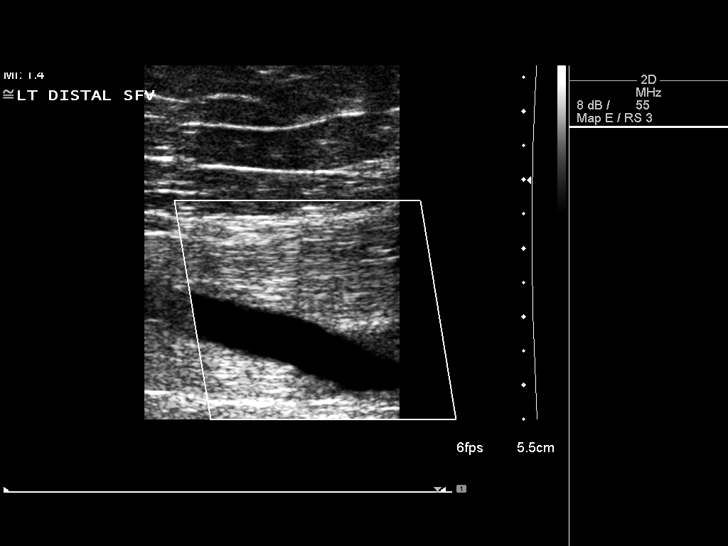
[im 27/42]
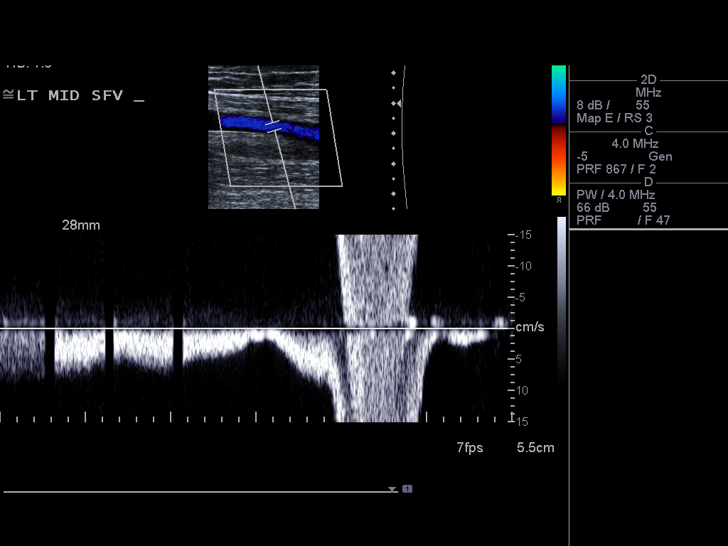
[im 31/42]
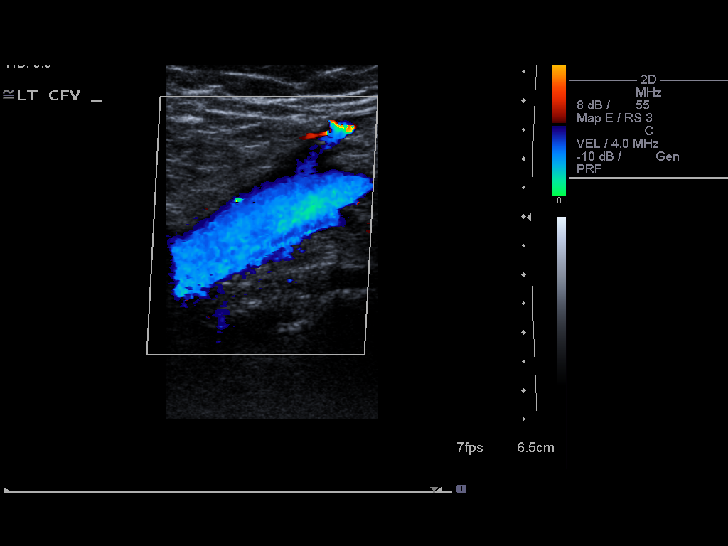
[im 34/42]
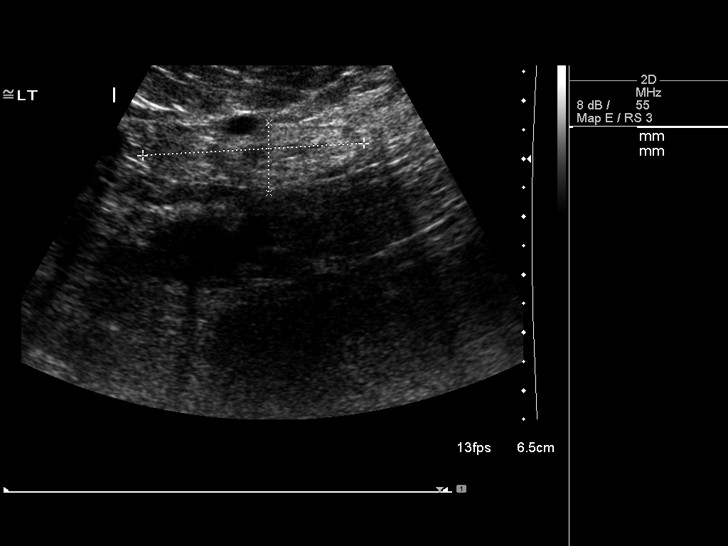
[im 38/42]
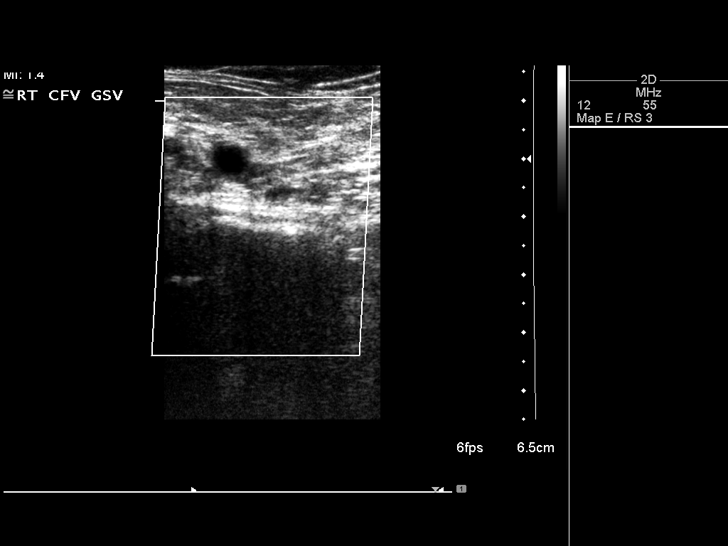
[im 42/42]
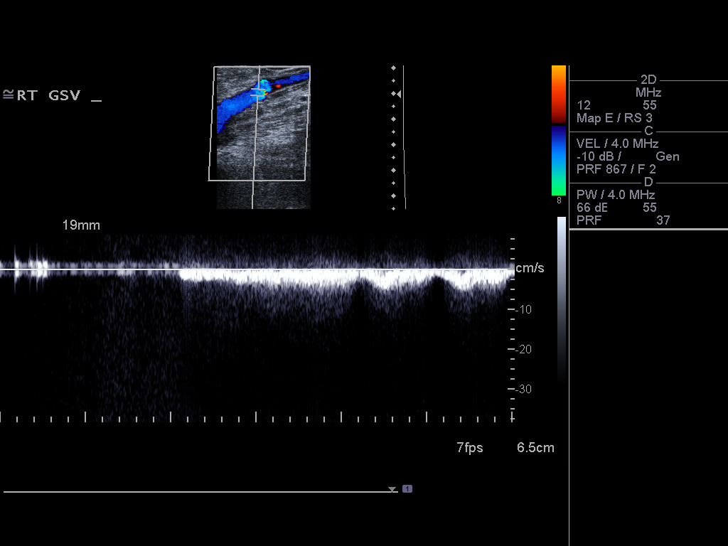

[13 of 24 positions shown; findings below may reference images not displayed]

FINDINGS: Contralateral Common Femoral Vein: Respiratory phasicity is normal
and symmetric with the symptomatic side. No evidence of thrombus.
Normal compressibility.

Common Femoral Vein: No evidence of thrombus. Normal
compressibility, respiratory phasicity and response to augmentation.

Saphenofemoral Junction: No evidence of thrombus. Normal
compressibility and flow on color Doppler imaging.

Profunda Femoral Vein: No evidence of thrombus. Normal
compressibility and flow on color Doppler imaging.

Femoral Vein: No evidence of thrombus. Normal compressibility,
respiratory phasicity and response to augmentation.

Popliteal Vein: No evidence of thrombus. Normal compressibility,
respiratory phasicity and response to augmentation.

Calf Veins: No evidence of thrombus. Normal compressibility and flow
on color Doppler imaging.

Superficial Great Saphenous Vein: No evidence of thrombus. Normal
compressibility and flow on color Doppler imaging.

Venous Reflux:  None.

Other Findings: Benign-appearing lymph nodes are noted in the left
inguinal region.
IMPRESSION: No evidence of left lower extremity deep venous thrombosis. Right
common femoral vein also patent. Benign-appearing lymph nodes in the
left inguinal region likely are of reactive etiology.

## 2016-06-16 ENCOUNTER — Other Ambulatory Visit: Payer: Self-pay | Admitting: Geriatric Medicine

## 2016-06-16 ENCOUNTER — Ambulatory Visit
Admission: RE | Admit: 2016-06-16 | Discharge: 2016-06-16 | Disposition: A | Discharge status: Home / Self Care | Payer: Medicare Other | Source: Ambulatory Visit | Attending: Geriatric Medicine | Admitting: Geriatric Medicine

## 2016-06-16 DIAGNOSIS — M25531 Pain in right wrist: Secondary | ICD-10-CM

## 2016-06-16 DIAGNOSIS — M542 Cervicalgia: Secondary | ICD-10-CM

## 2016-06-16 IMAGING — DX DG CERVICAL SPINE 2 OR 3 VIEWS
3 series · 3 of 3 positions shown · non-contrast
Comparison: [DATE].

CLINICAL DATA: 83-year-old who fell approximately 5 days ago,
complaining of pain when turning the head and neck to the right.
Chronic 30+ year neck pain.

EXAM:
CERVICAL SPINE - 2-3 VIEW

[dg cervical spine 2 or 3 views (1 of 3)]
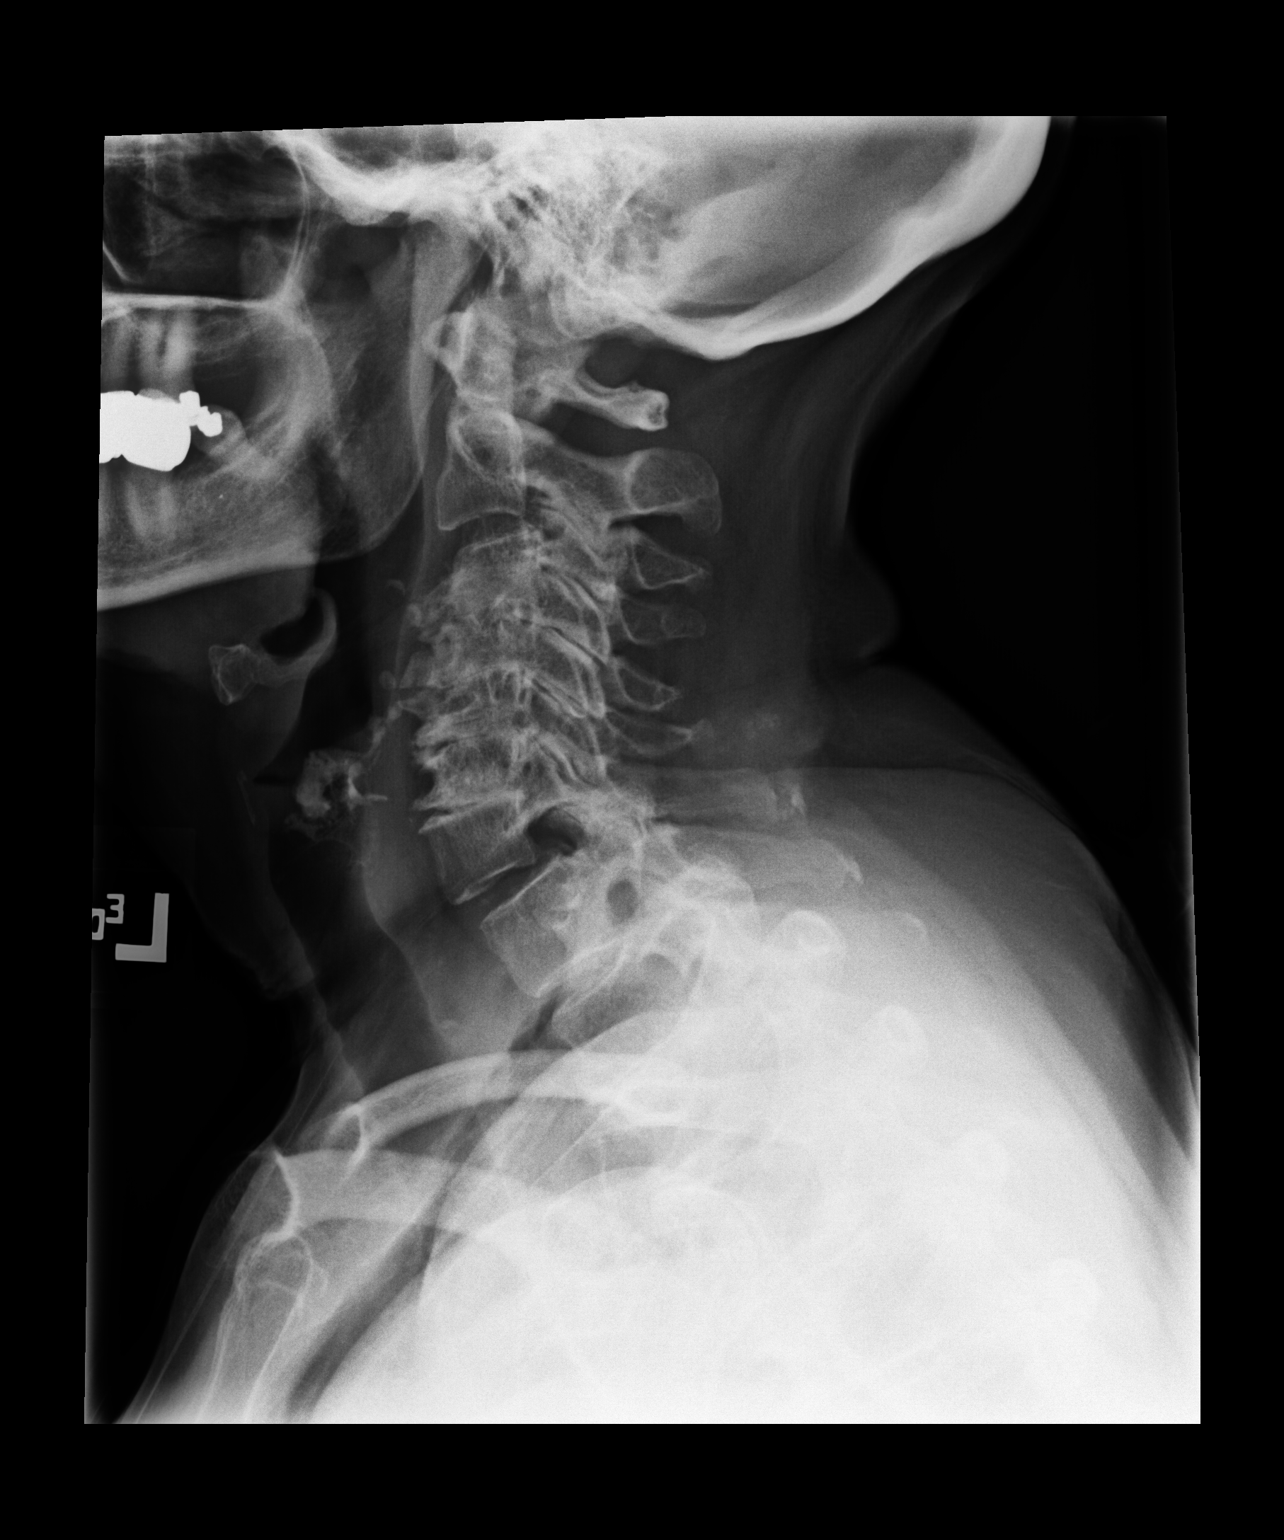

[dg cervical spine 2 or 3 views (2 of 3)]
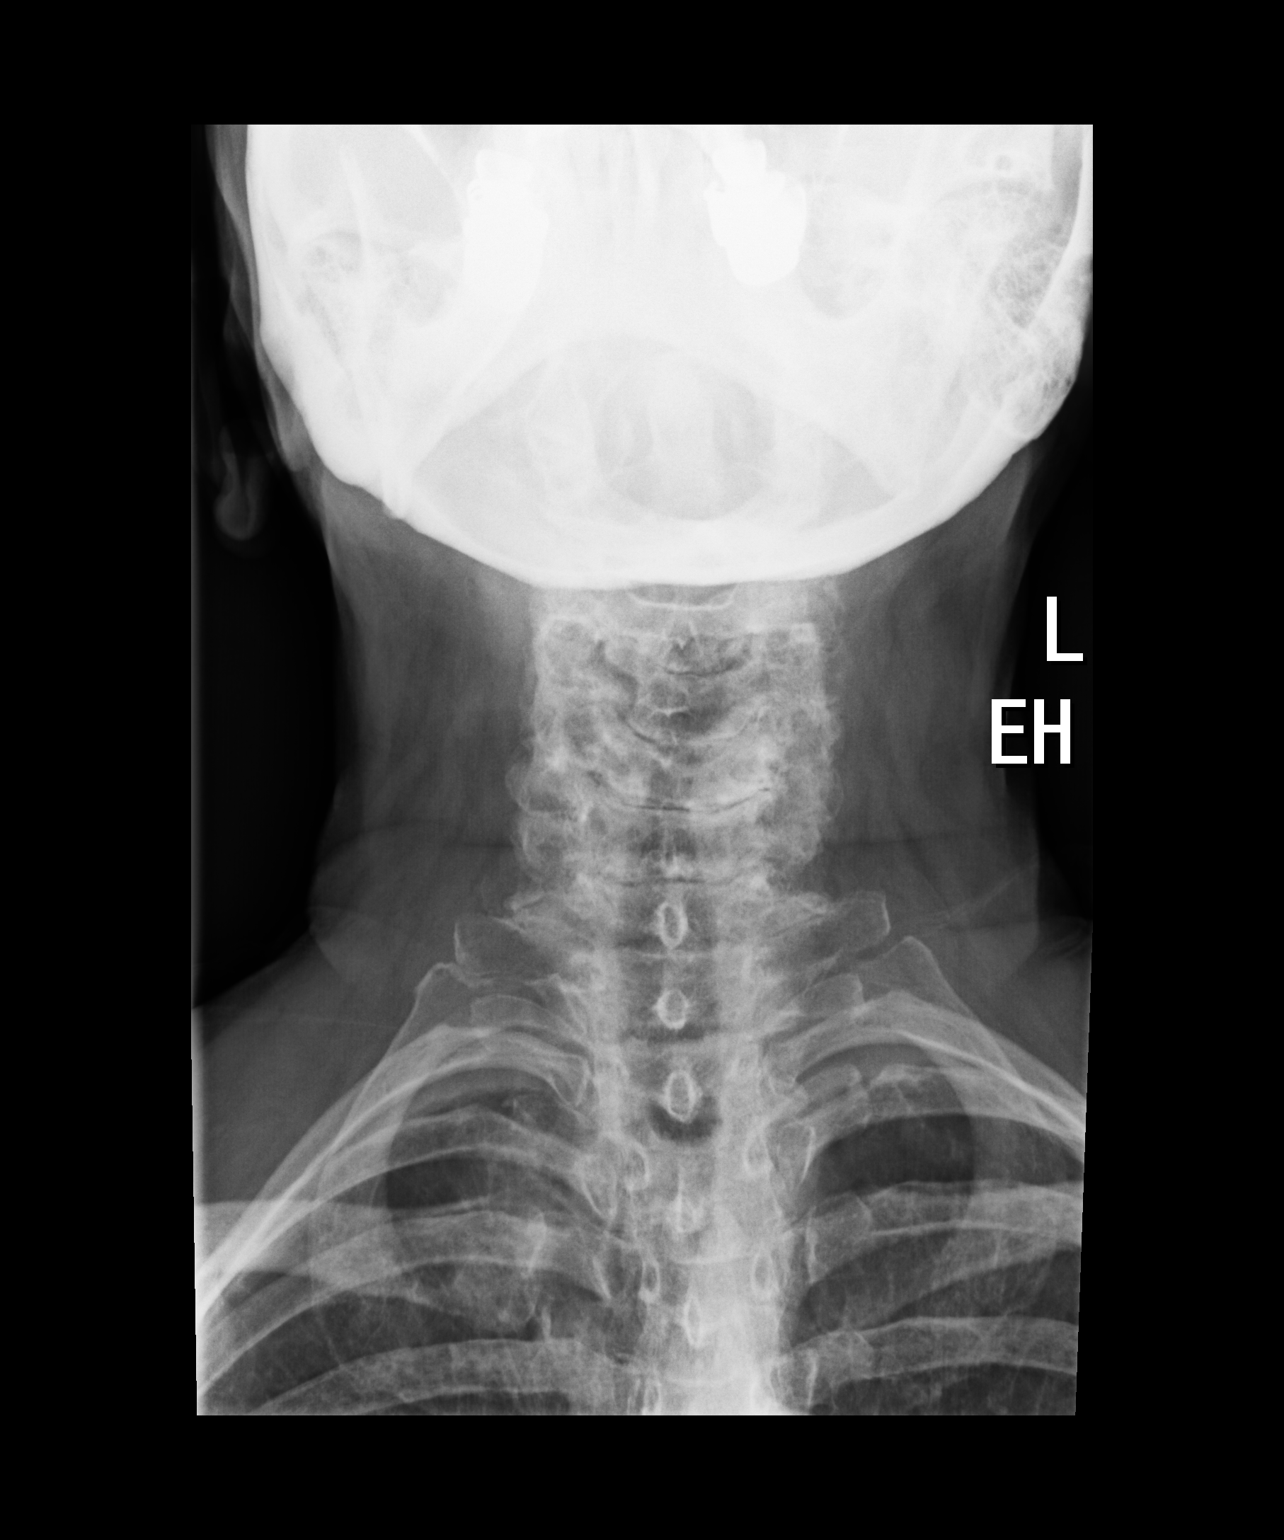

[dg cervical spine 2 or 3 views (3 of 3)]
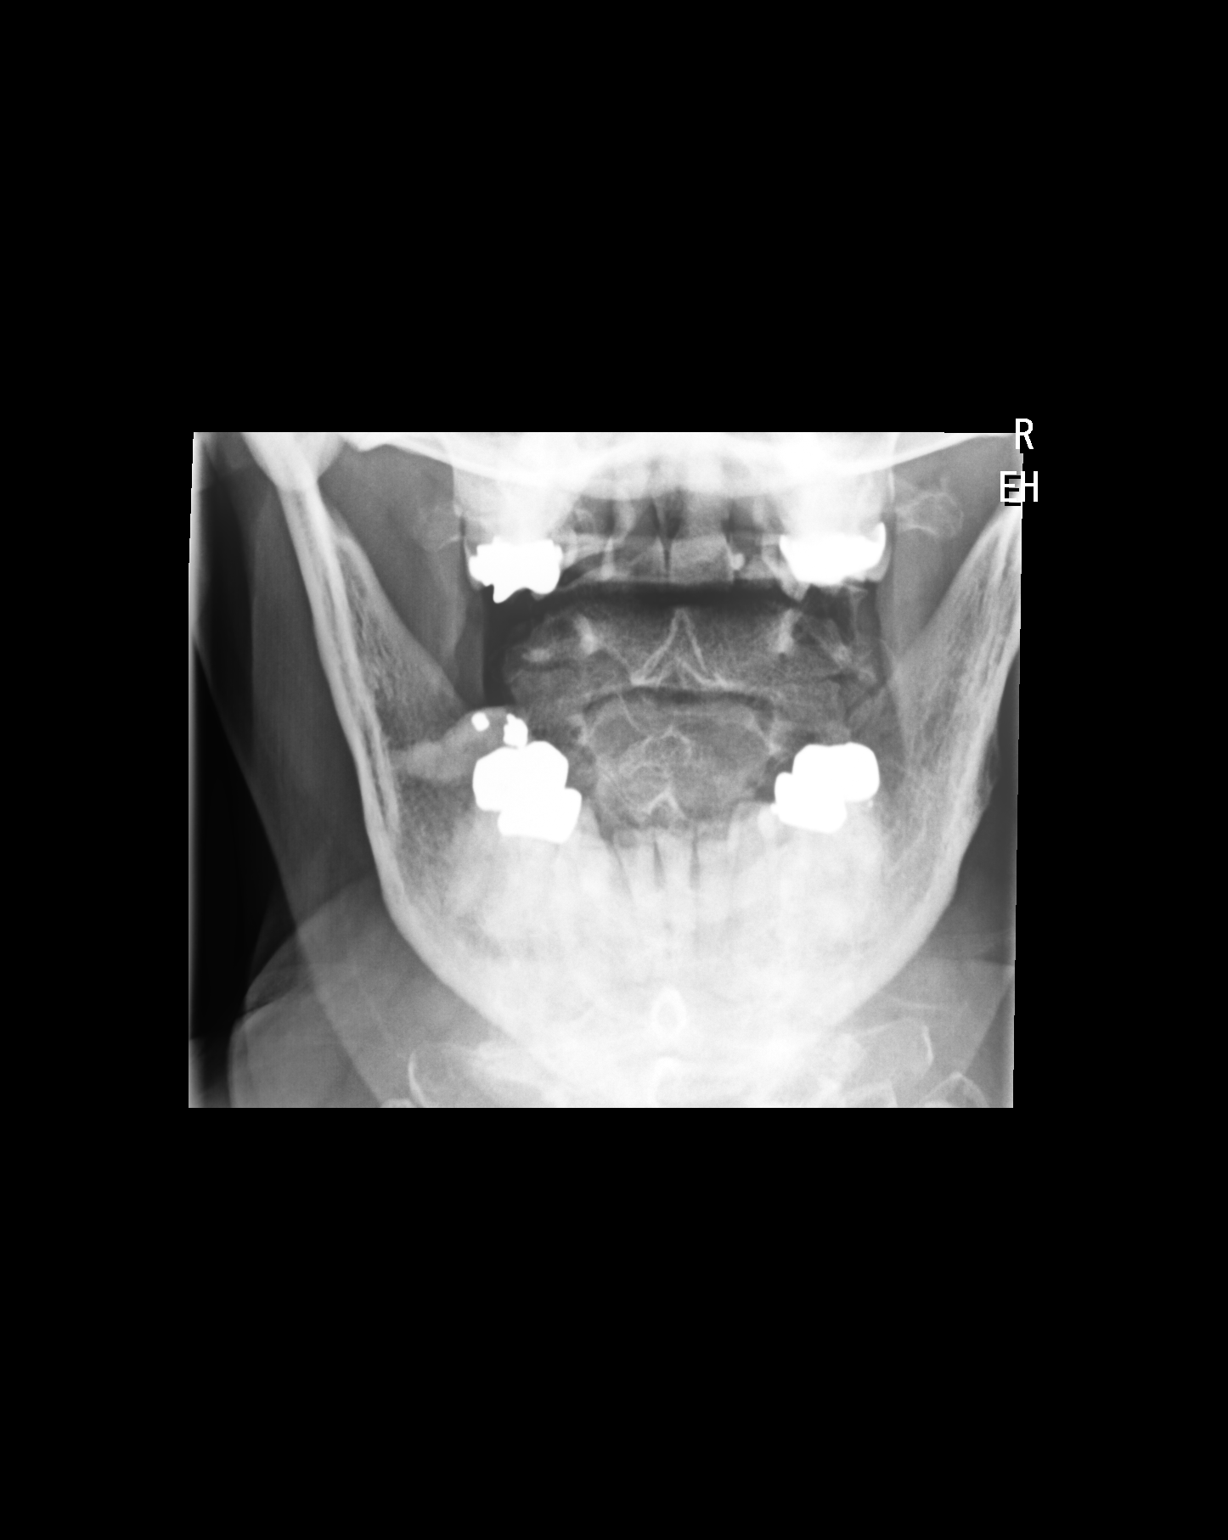

[3 of 3 positions shown; findings below may reference images not displayed]

FINDINGS: No visible acute fractures. Complete loss of the disc spaces with
associated severe endplate hypertrophic changes at C3-4, C4-5, C5-6
and C6-7. Degenerative 3 mm spondylolisthesis of C2 on C3.
Degenerative 5 mm spondylolisthesis of C6 on C7. Diffuse facet
degenerative changes. Normal prevertebral soft tissues.
IMPRESSION: 1. No visible acute fractures.
2. Severe multilevel degenerative disc disease, spondylosis and
facet degenerative changes.
3. Degenerative 3 mm spondylolisthesis of C2-2 on C3 and
degenerative 5 mm spondylolisthesis of C6 on C7.

## 2016-06-16 IMAGING — DX DG WRIST COMPLETE 3+V*R*
4 series · 4 of 4 positions shown · non-contrast
Comparison: None.

CLINICAL DATA: 83-year-old who fell 5 days ago. Persistent right
wrist pain with motion. Initial encounter.

EXAM:
RIGHT WRIST - COMPLETE 3+ VIEW

[dg wrist complete right (1 of 4)]
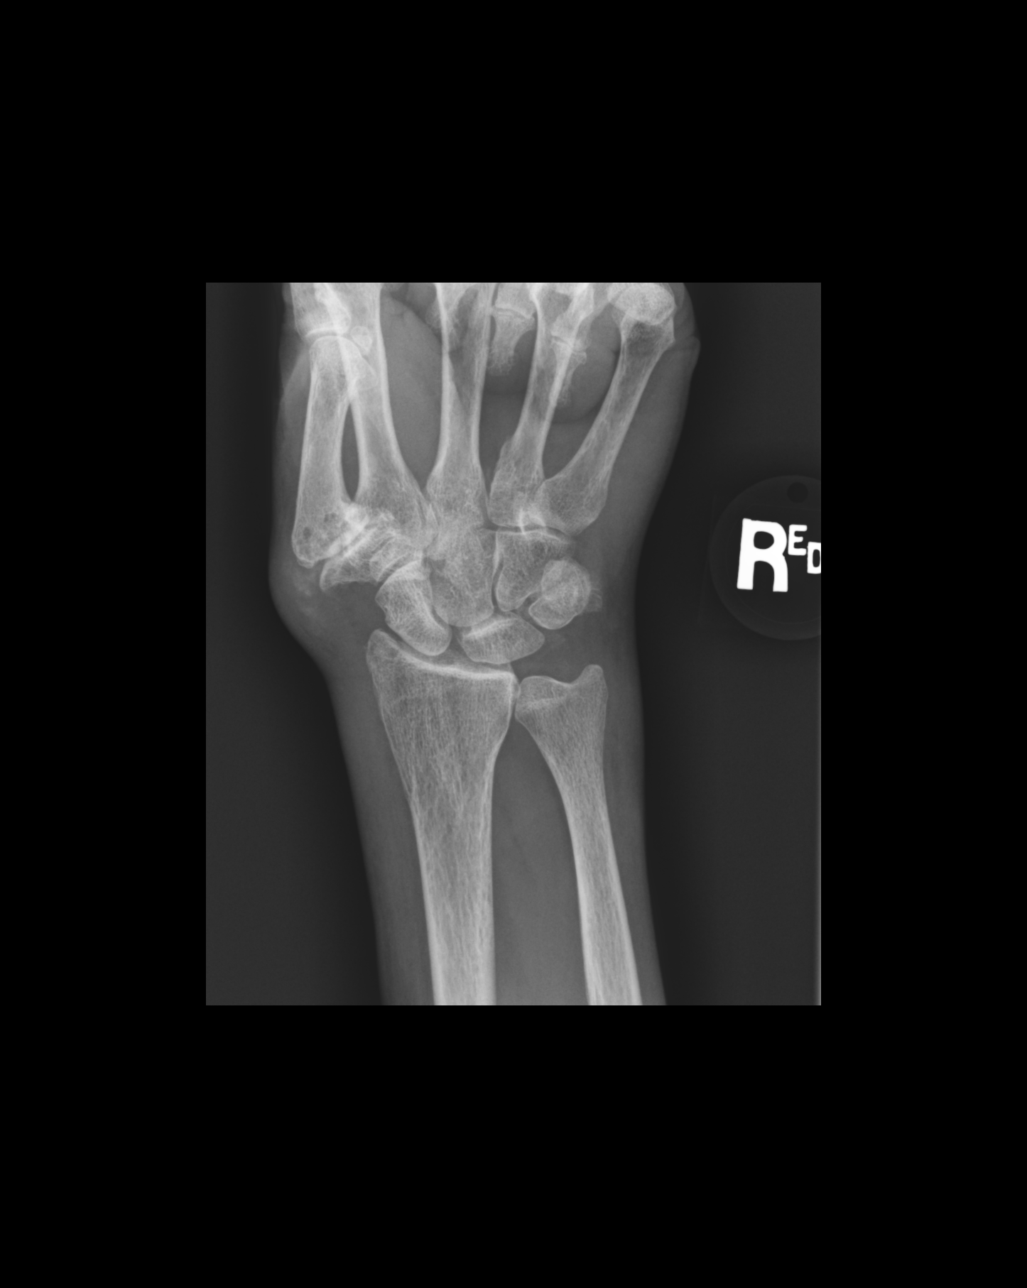

[dg wrist complete right (2 of 4)]
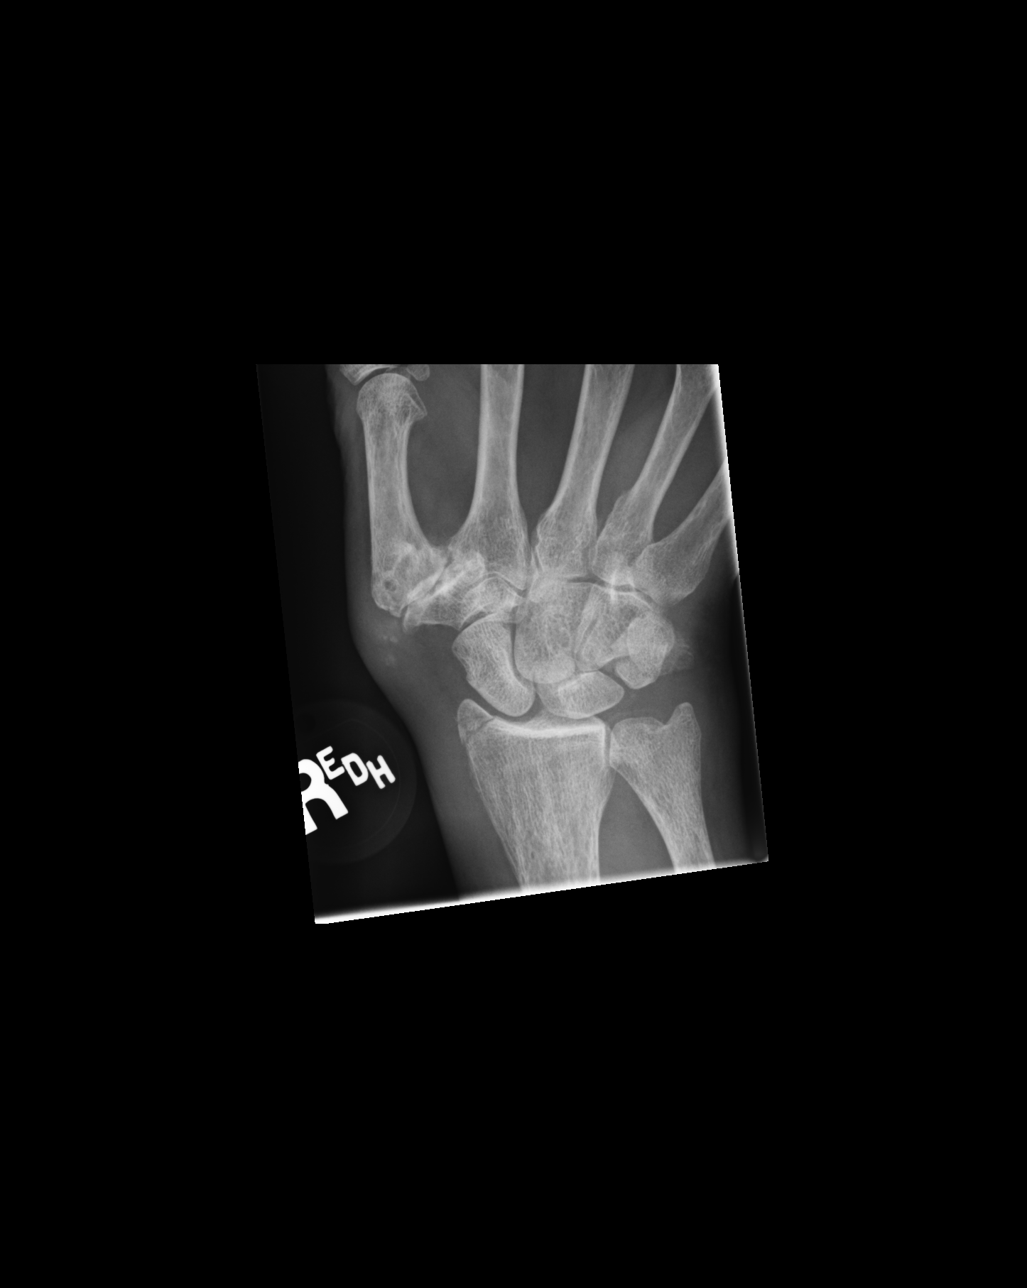

[dg wrist complete right (3 of 4)]
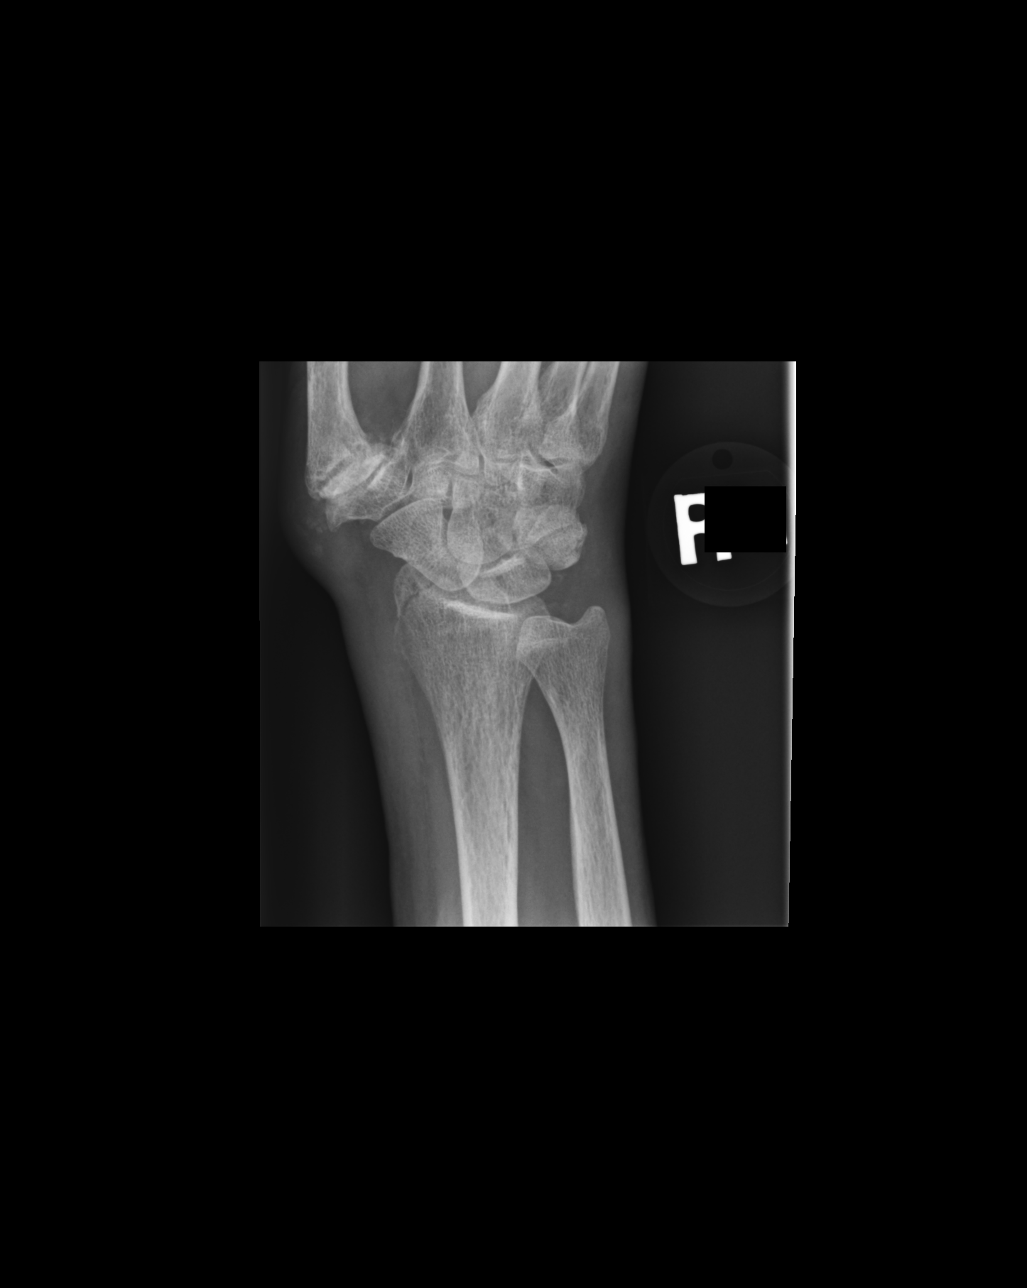

[dg wrist complete right (4 of 4)]
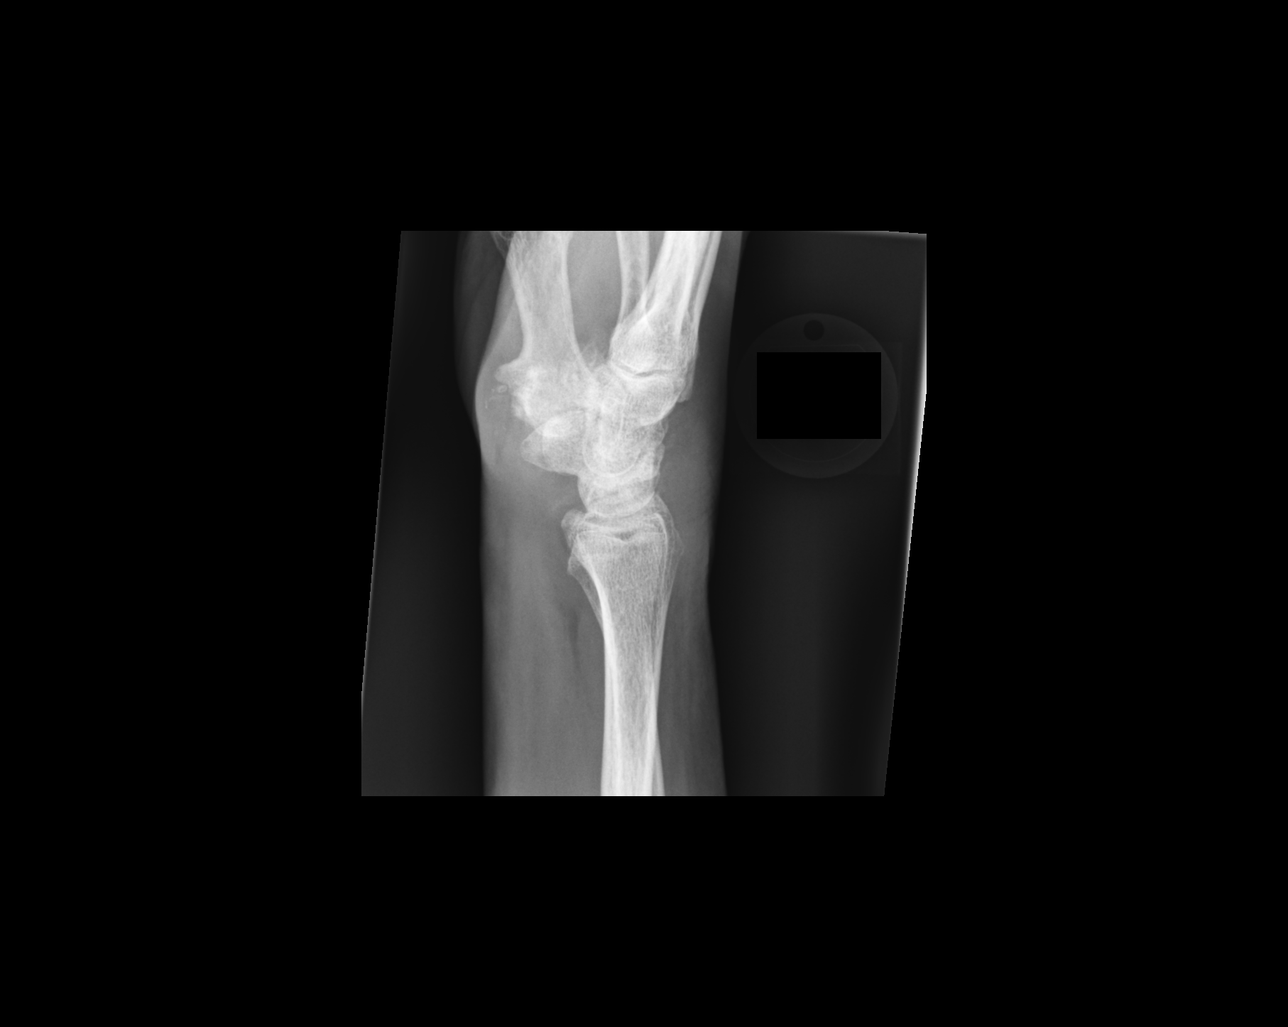

[4 of 4 positions shown; findings below may reference images not displayed]

FINDINGS: Acute/subacute nondisplaced fracture involving the radial styloid.
No other fractures. Severe joint space narrowing and associated
spurring involving the trapezium-first metacarpal joint, with
overlying soft tissue swelling and soft tissue calcification.
Calcification involving the triangular fibrocartilage complex. Mild
narrowing of remaining joint spaces in the wrist. Osseous
demineralization.
IMPRESSION: 1. Acute/subacute nondisplaced fracture involving the radial
styloid. No other fractures.
2. CPPD with calcification involving the triangular fibrocartilage
complex. CPPD also involves the trapezium-first metacarpal joint
with associated severe osteoarthritis.

## 2017-01-30 ENCOUNTER — Emergency Department (HOSPITAL_COMMUNITY): Payer: Medicare Other

## 2017-01-30 ENCOUNTER — Encounter (HOSPITAL_COMMUNITY): Payer: Self-pay | Admitting: Family Medicine

## 2017-01-30 ENCOUNTER — Emergency Department (HOSPITAL_COMMUNITY)
Admission: EM | Admit: 2017-01-30 | Discharge: 2017-01-30 | Disposition: A | Discharge status: Home / Self Care | Payer: Medicare Other | Attending: Emergency Medicine | Admitting: Emergency Medicine

## 2017-01-30 DIAGNOSIS — Z79899 Other long term (current) drug therapy: Secondary | ICD-10-CM | POA: Diagnosis not present

## 2017-01-30 DIAGNOSIS — R0789 Other chest pain: Secondary | ICD-10-CM | POA: Diagnosis present

## 2017-01-30 DIAGNOSIS — F419 Anxiety disorder, unspecified: Secondary | ICD-10-CM | POA: Insufficient documentation

## 2017-01-30 DIAGNOSIS — I10 Essential (primary) hypertension: Secondary | ICD-10-CM | POA: Insufficient documentation

## 2017-01-30 HISTORY — DX: Gastro-esophageal reflux disease without esophagitis: K21.9

## 2017-01-30 HISTORY — DX: Other specified disorders of bone density and structure, unspecified site: M85.80

## 2017-01-30 HISTORY — DX: Polyneuropathy, unspecified: G62.9

## 2017-01-30 HISTORY — DX: Venous insufficiency (chronic) (peripheral): I87.2

## 2017-01-30 LAB — BASIC METABOLIC PANEL
ANION GAP: 8 (ref 5–15)
BUN: 17 mg/dL (ref 6–20)
CHLORIDE: 97 mmol/L — AB (ref 101–111)
CO2: 24 mmol/L (ref 22–32)
Calcium: 8.6 mg/dL — ABNORMAL LOW (ref 8.9–10.3)
Creatinine, Ser: 0.87 mg/dL (ref 0.44–1.00)
GFR calc Af Amer: >60 mL/min (ref >60)
GFR calc non Af Amer: >60 mL/min (ref >60)
Glucose, Bld: 157 mg/dL — ABNORMAL HIGH (ref 65–99)
POTASSIUM: 3.7 mmol/L (ref 3.5–5.1)
SODIUM: 129 mmol/L — AB (ref 135–145)

## 2017-01-30 LAB — CBC
HEMATOCRIT: 33.3 % — AB (ref 36.0–46.0)
HEMOGLOBIN: 11.3 g/dL — AB (ref 12.0–15.0)
MCH: 33.5 pg (ref 26.0–34.0)
MCHC: 33.9 g/dL (ref 30.0–36.0)
MCV: 98.8 fL (ref 78.0–100.0)
Platelets: 223 10*3/uL (ref 150–400)
RBC: 3.37 MIL/uL — AB (ref 3.87–5.11)
RDW: 13.1 % (ref 11.5–15.5)
WBC: 6.7 10*3/uL (ref 4.0–10.5)

## 2017-01-30 LAB — I-STAT TROPONIN, ED
TROPONIN I, POC: 0.08 ng/mL (ref 0.00–0.08)
Troponin i, poc: 0.05 ng/mL (ref 0.00–0.08)

## 2017-01-30 IMAGING — CT CT ANGIO CHEST
2 of 6 series · 18 of 46 positions shown · IV contrast (ISOVUE)
Comparison: Chest radiograph earlier this day.

CLINICAL DATA: Central chest pain. Positive D-dimer. Pulmonary
embolus suspected, intermediate probability.

EXAM:
CT ANGIOGRAPHY CHEST WITH CONTRAST
TECHNIQUE: Multidetector CT imaging of the chest was performed using the
standard protocol during bolus administration of intravenous
contrast. Multiplanar CT image reconstructions and MIPs were
obtained to evaluate the vascular anatomy.
CONTRAST:  75mL [UR] IOPAMIDOL ([UR]) INJECTION 76%

[Series 6: thins · axial · 0.62mm/px · z∈[+75,+328]mm · 15 of 279 slices shown]
[im 13/279  lung]
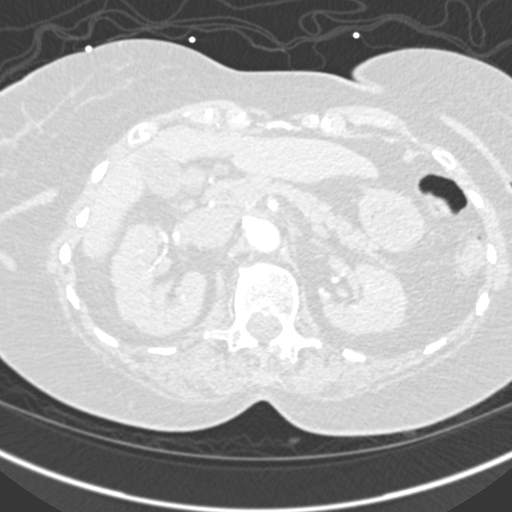
[im 37/279  soft-tissue]
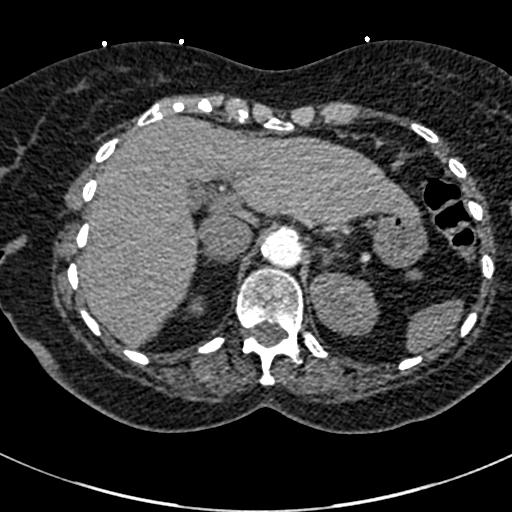
[im 49/279  lung]
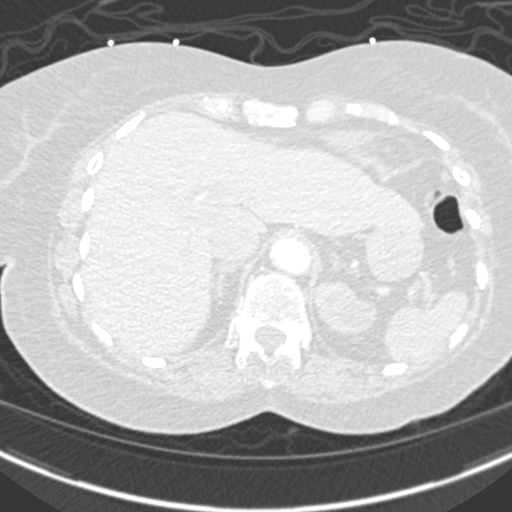
[im 73/279  soft-tissue]
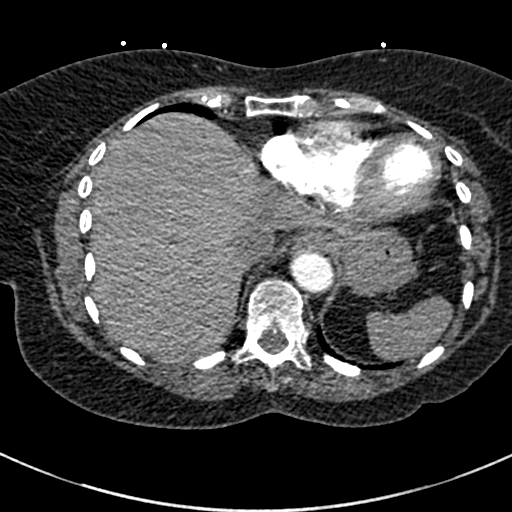
[im 85/279  lung]
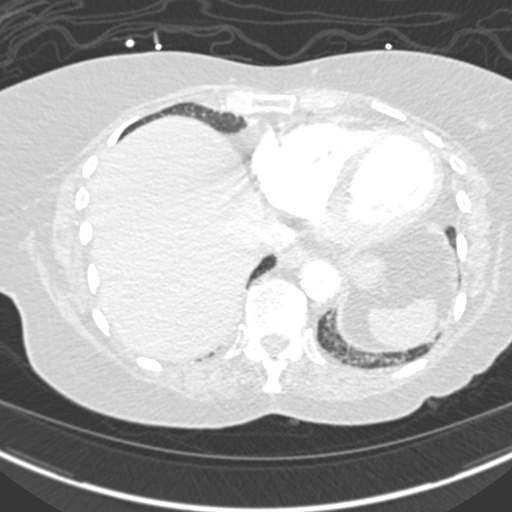
[im 109/279  soft-tissue]
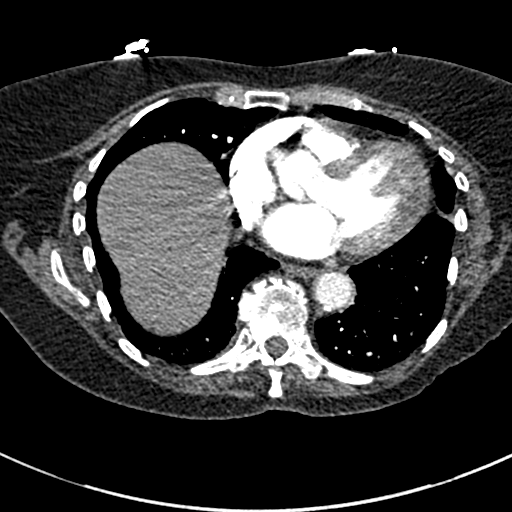
[im 121/279  lung]
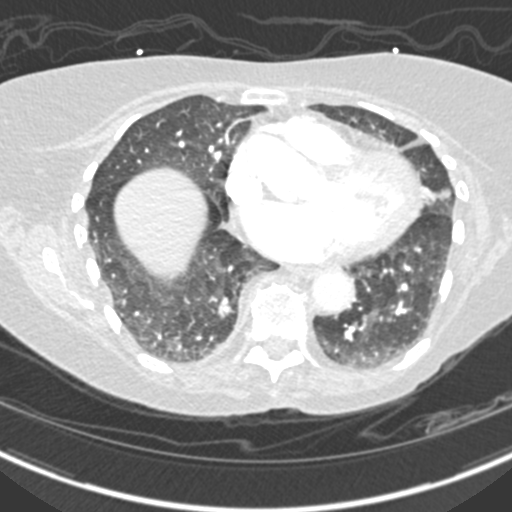
[im 146/279  soft-tissue]
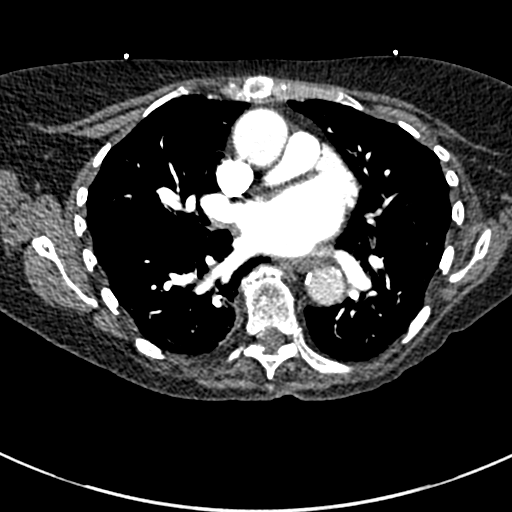
[im 158/279  lung]
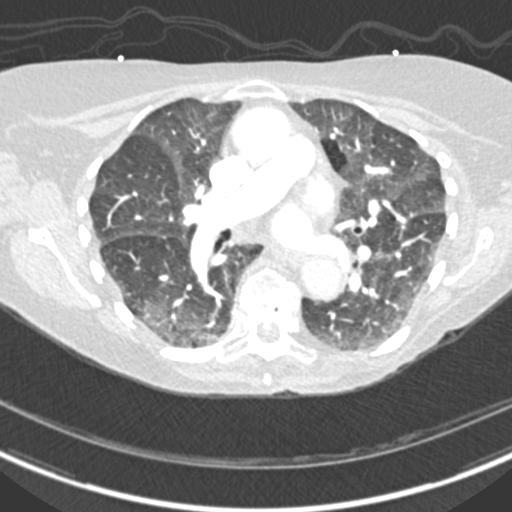
[im 170/279  soft-tissue]
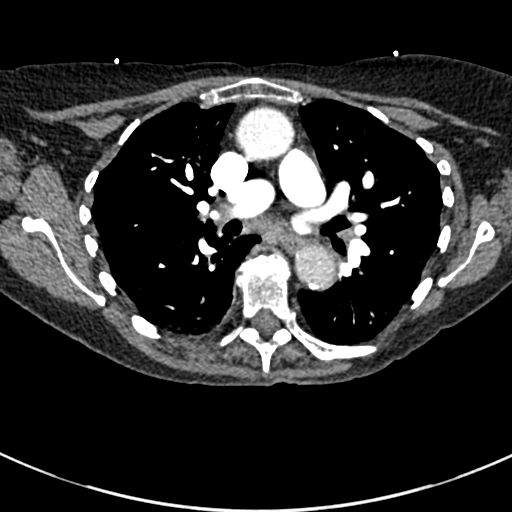
[im 194/279  lung]
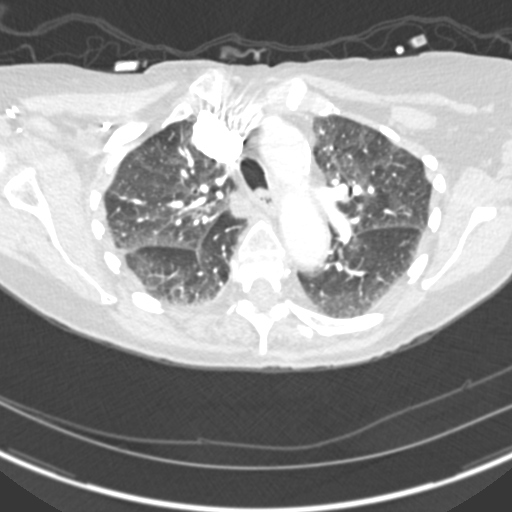
[im 206/279  soft-tissue]
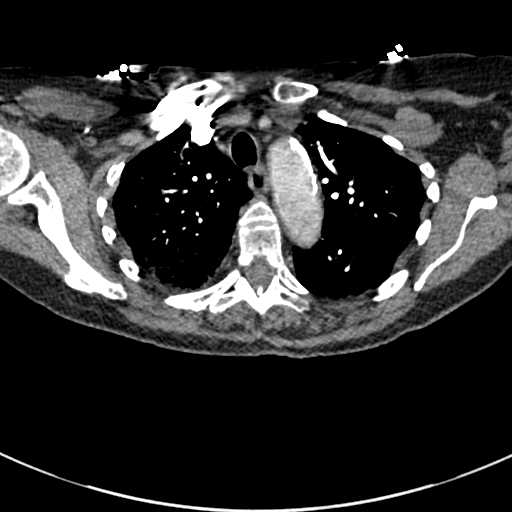
[im 230/279  lung]
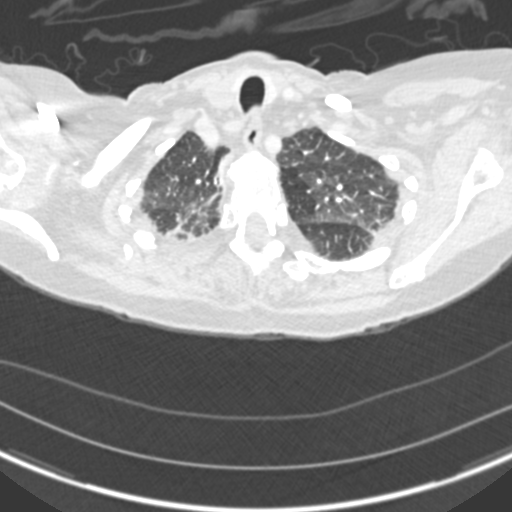
[im 242/279  soft-tissue]
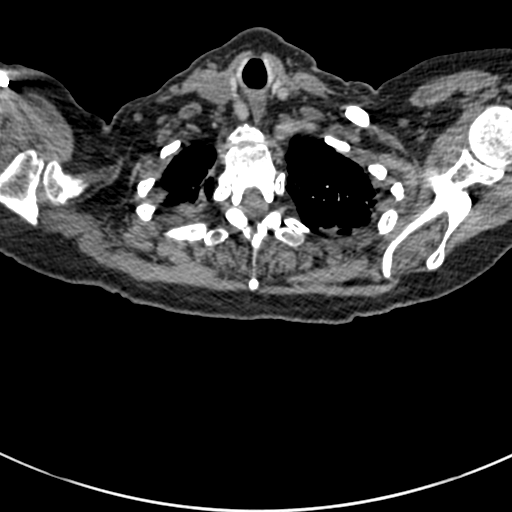
[im 266/279  lung]
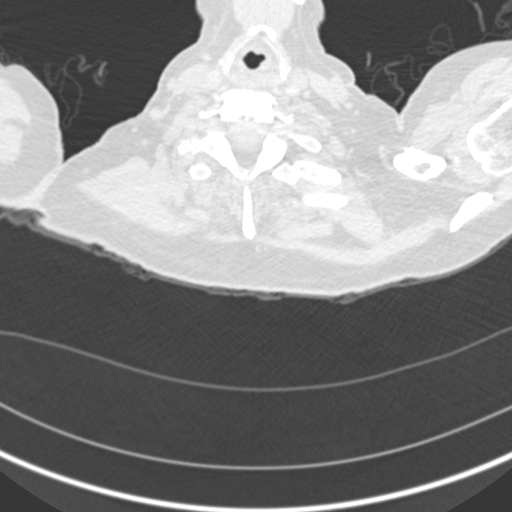

[Series 8: coronal mpr · coronal · 0.57mm/px · 3 of 109 slices shown]
[im 28/109  soft-tissue]
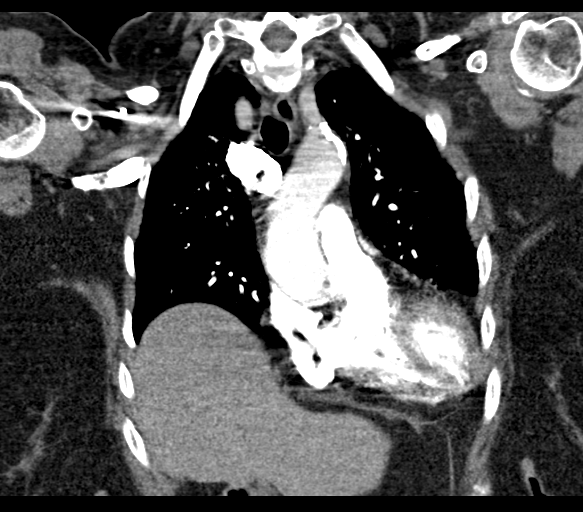
[im 55/109  soft-tissue]
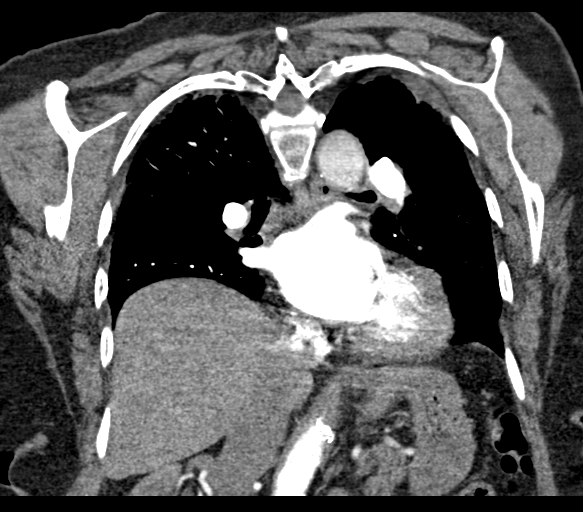
[im 82/109  soft-tissue]
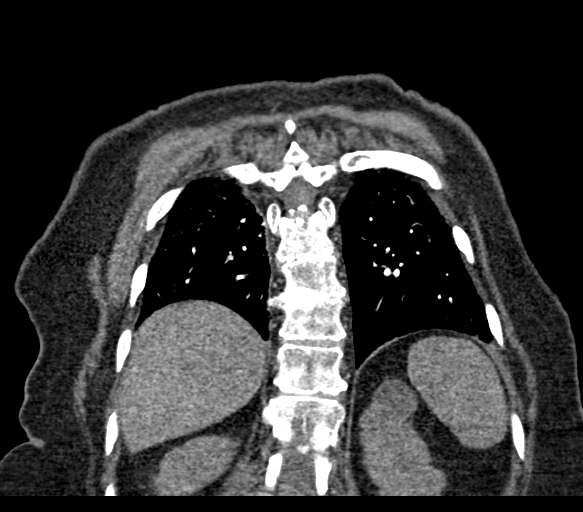

[18 of 46 positions shown; findings below may reference images not displayed]

FINDINGS: Cardiovascular: There are no filling defects within the pulmonary
arteries to suggest pulmonary embolus. Atherosclerosis and
tortuosity of the thoracic aorta. No aortic dissection. Heart is
normal in size. Minimal pericardial fluid.

Mediastinum/Nodes: No enlarged mediastinal or hilar nodes. The
esophagus is decompressed. Visualized thyroid gland is normal.

Lungs/Pleura: Moderate bronchial thickening. Geographic ground-glass
opacities in the upper lobes with minimal smooth septal thickening.
Mild hypoventilatory atelectasis. No pulmonary mass. No suspicious
nodule. No pleural fluid.

Upper Abdomen: No acute abnormality.

Musculoskeletal: There are no acute or suspicious osseous
abnormalities. Diffuse disc space narrowing and endplate spurring.
Peripherally calcified disc osteophyte complex at T7-T8 causes mass
effect on the spinal canal.

Review of the MIP images confirms the above findings.
IMPRESSION: 1. No pulmonary embolus.
2. Patchy upper lobe ground-glass opacities with bronchial and
minimal smooth septal thickening, constellation of findings suggests
mild pulmonary edema. Infectious/inflammatory etiologies are also
considered.
3. Aortic atherosclerosis and tortuosity.  No dissection.

Aortic Atherosclerosis ([UR]-[UR]).

## 2017-01-30 IMAGING — CR DG CHEST 2V
2 series · 2 of 2 positions shown · non-contrast
Comparison: [DATE] QUSHAIRY

CLINICAL DATA: Chest pain.  Elevated D-dimer.  Hypertension.

EXAM:
CHEST  2 VIEW

[w chest pa]
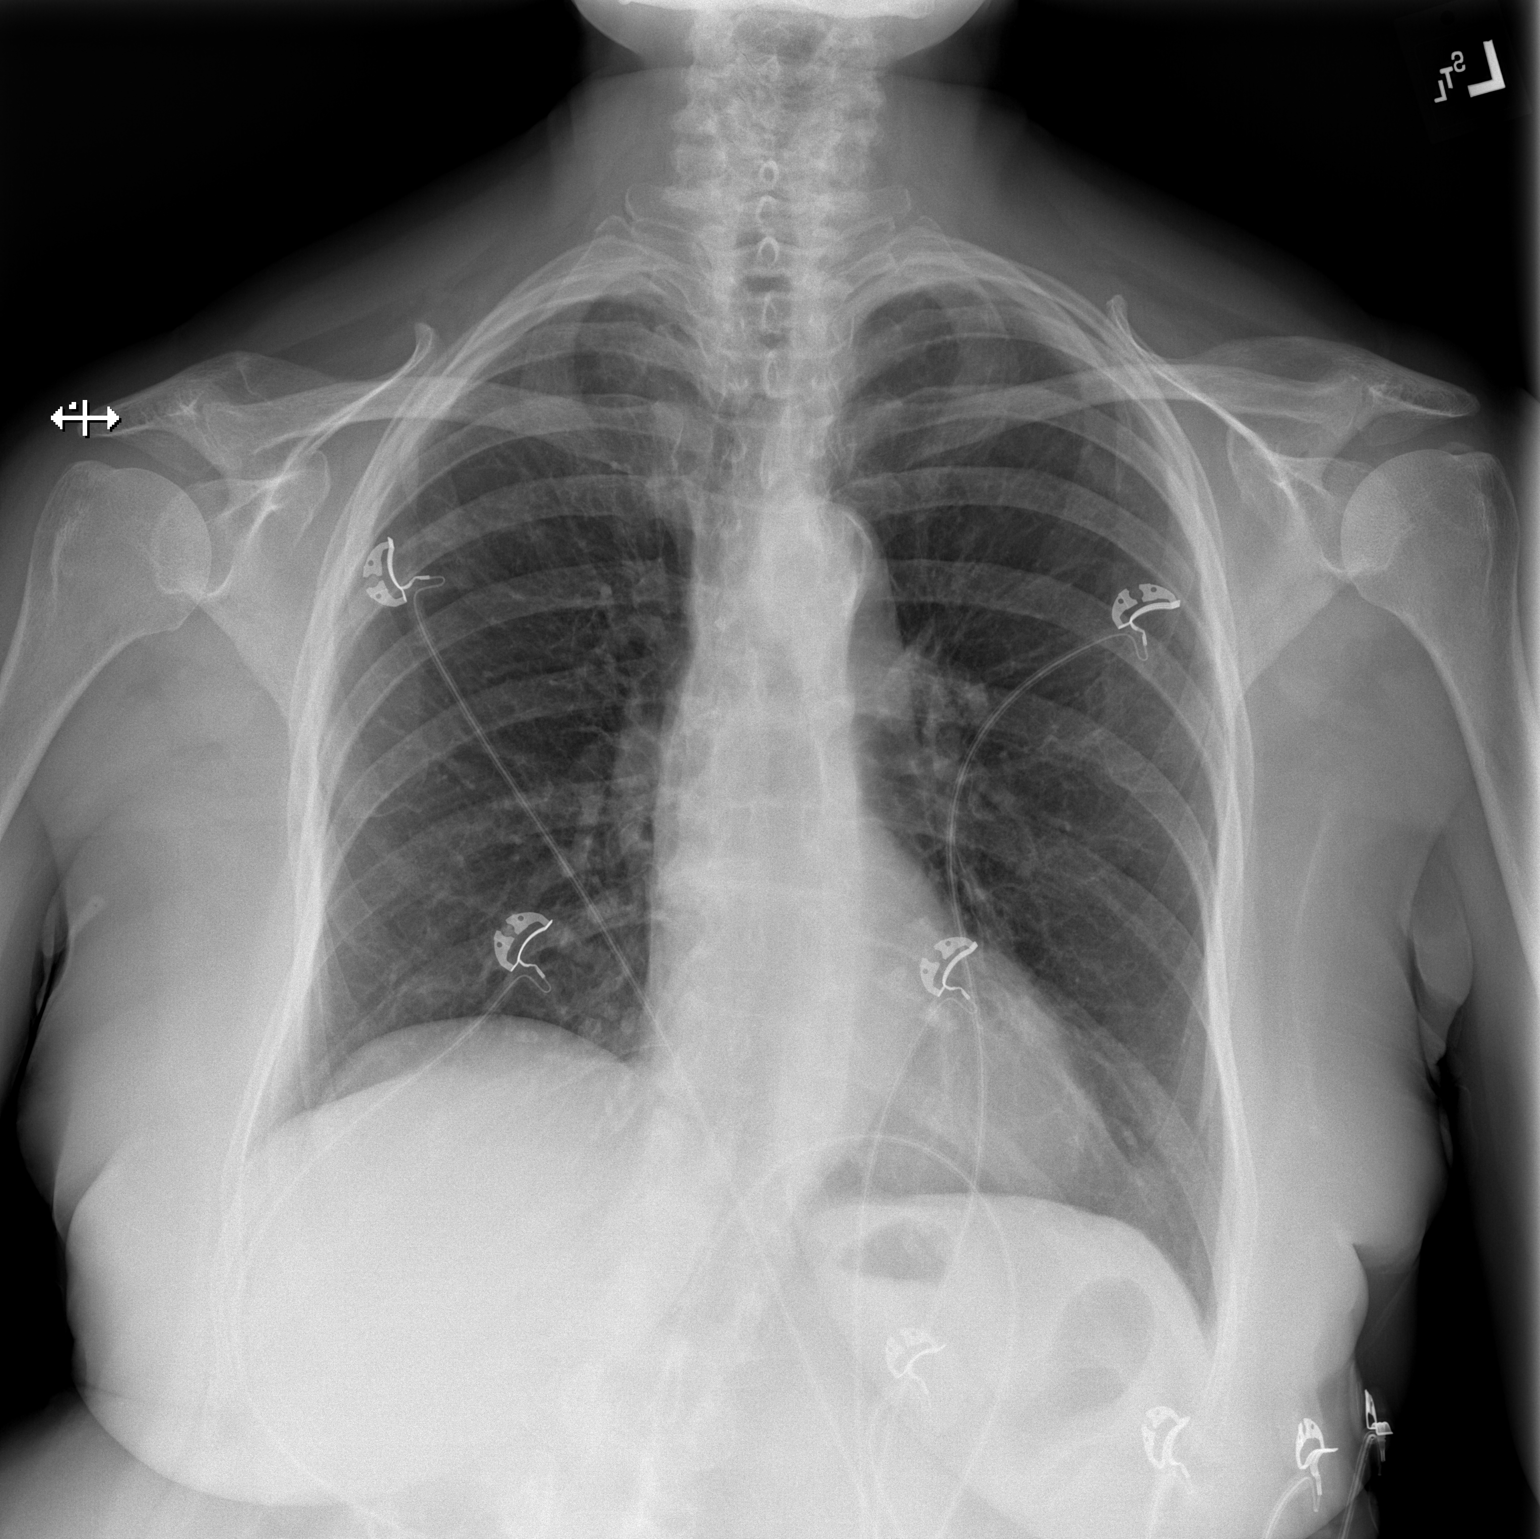

[w chest lat]
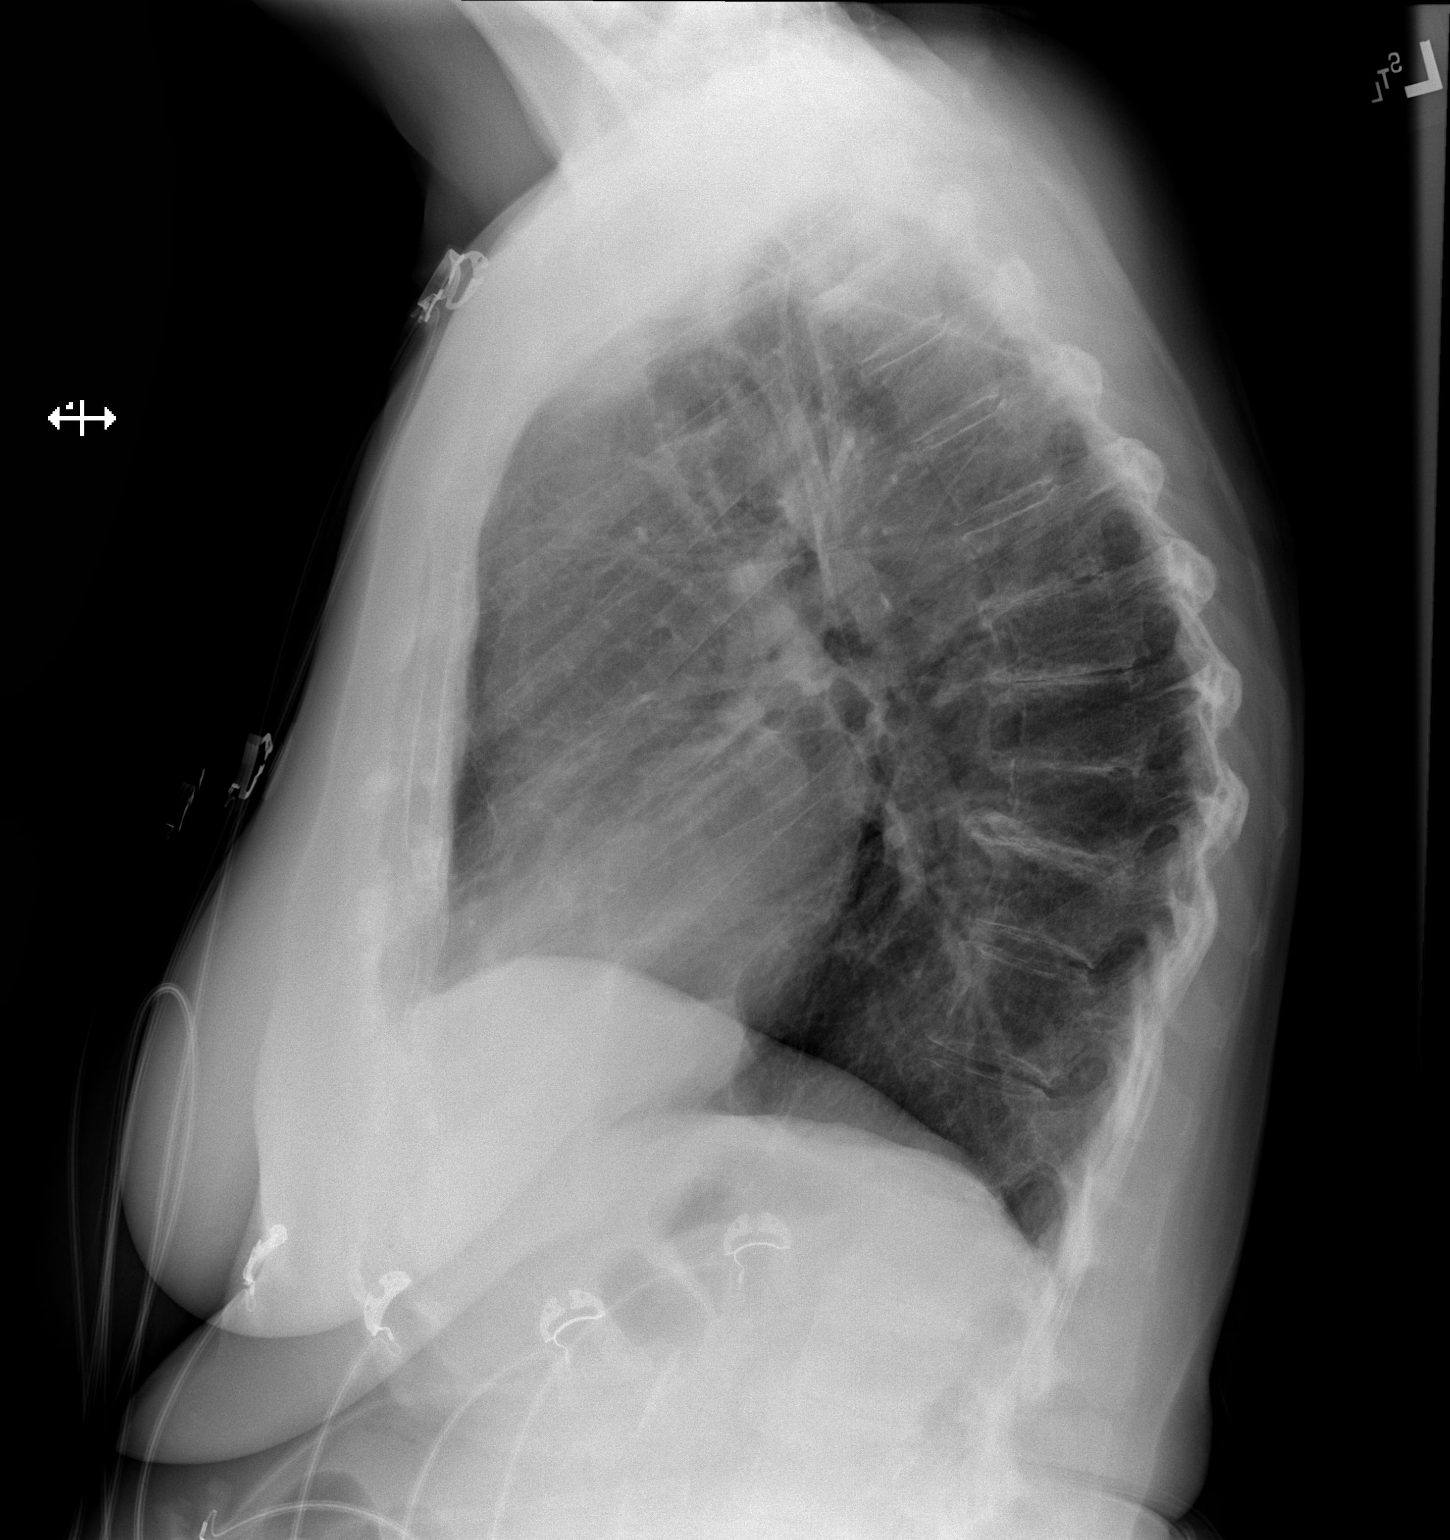

[2 of 2 positions shown; findings below may reference images not displayed]

FINDINGS: right hemidiaphragm elevation. Midline trachea. Normal heart size.
Atherosclerosis in the transverse aorta. No pleural effusion or
pneumothorax. Mild biapical pleural thickening. Clear lungs.
Numerous leads and wires project over the chest.
IMPRESSION: No acute cardiopulmonary disease.

Aortic Atherosclerosis ([3I]-[3I]).

## 2017-01-30 MED ORDER — IOPAMIDOL (ISOVUE-370) INJECTION 76%
75.0000 mL | Freq: Once | INTRAVENOUS | Status: AC | PRN
  Administered 2017-01-30: 75 mL via INTRAVENOUS

## 2017-01-30 MED ORDER — IOPAMIDOL (ISOVUE-370) INJECTION 76%
INTRAVENOUS | Status: AC
  Filled 2017-01-30: qty 100

## 2017-01-30 MED ORDER — SODIUM CHLORIDE 0.9 % IV BOLUS (SEPSIS)
1000.0000 mL | Freq: Once | INTRAVENOUS | Status: AC
  Administered 2017-01-30: 1000 mL via INTRAVENOUS

## 2017-01-30 NOTE — ED Notes (Signed)
Dr. Theodoro Grist has patients records from [facility].

## 2017-01-30 NOTE — Discharge Instructions (Addendum)
Your lab results were overall reassuring, though you continue to have low sodium.  This should be further evaluated and treated by your primary care provider. There were no signs of blood clot in the lung (pulmonary embolism) on the CT scan.  There were what was thought to be incidental findings on the CT, but any further assessment would need to take place on an outpatient basis.  Please follow-up with the cardiologist on this matter per further assessment.

## 2017-01-30 NOTE — ED Notes (Signed)
Patient transported to CT 

## 2017-01-30 NOTE — ED Provider Notes (Signed)
[facility]-EMERGENCY DEPT Provider Note   CSN: [id number] Arrival date & time: 01/30/17  1739     History   Chief Complaint Chief Complaint  Patient presents with  . Chest Pain  . Abnormal Lab    HPI Autumn [name] is a 83 y.o. female.  HPI   Autumn [name] is a 83 y.o. female, with a history of Anxiety, HTN, LBBB, presenting to the ED with chest pain beginning 12/13.  States she has been under increased stress lately and instances of stress seem to bring on the pain. Pain is pressure, central chest, intermittent, duration is variable from a couple minutes to about 10 minutes, radiating to both shoulder. Will come on at rest or with exertion. Does not increase or arise with deep breathing. Has not experienced this before.  She does endorse "getting winded" while walking up stairs, but this has been going on for months.   Denies cough, fever/chills, N/V/D, diaphoresis, dizziness, recent illness, falls/trauma, urinary symptoms, peripheral edema, or any other complaints.   Sent here from Dr. Barbra Sarks office for concern for PE due to d-dimer of 0.63.    Past Medical History:  Diagnosis Date  . Anxiety   . GERD (gastroesophageal reflux disease)   . Hypertension   . Hypothyroid   . LBBB (left bundle branch block)   . Osteopenia   . Peripheral neuropathy   . Venous insufficiency     Patient Active Problem List   Diagnosis Date Noted  . Normal coronary arteries 09/19/2013  . LBBB (left bundle branch block) 09/19/2013  . Congestive dilated cardiomyopathy (HCC) 07/24/2013    Past Surgical History:  Procedure Laterality Date  . ABDOMINAL HYSTERECTOMY    . BLADDER SURGERY    . LEFT HEART CATHETERIZATION WITH CORONARY ANGIOGRAM N/A 09/02/2013   Procedure: LEFT HEART CATHETERIZATION WITH CORONARY ANGIOGRAM;  Surgeon: [name], MD;  Location: [facility] CATH LAB;  Service: Cardiovascular;  Laterality: N/A;  . RENAL ANGIOGRAM N/A 09/02/2013   Procedure:  RENAL ANGIOGRAM;  Surgeon: [name], MD;  Location: [facility] CATH LAB;  Service: Cardiovascular;  Laterality: N/A;    OB History    No data available       Home Medications    Prior to Admission medications   Medication Sig Start Date End Date Taking? Authorizing Provider  Ascorbic Acid (VITAMIN C PO) Take 1 tablet by mouth daily.   Yes [provider]  carvedilol (COREG) 6.25 MG tablet Take 1 tablet (6.25 mg total) by mouth 2 (two) times daily. 06/23/14  Yes [name], MD  Cholecalciferol (VITAMIN D PO) Take 1 tablet by mouth daily.   Yes [provider]  CINNAMON PO Take 1 tablet by mouth daily.   Yes [provider]  Cyanocobalamin (VITAMIN B-12 PO) Take 1 tablet by mouth daily.   Yes [provider]  estradiol (ESTRACE) 1 MG tablet Take 1 mg by mouth daily.   Yes [provider]  Glucosamine HCl (GLUCOSAMINE PO) Take 1 tablet by mouth daily.   Yes [provider]  HYDROcodone-acetaminophen (NORCO/VICODIN) 5-325 MG per tablet Take 1 tablet by mouth daily as needed for moderate pain.   Yes [provider]  KRILL OIL PO Take 1 capsule by mouth daily.   Yes [provider]  levothyroxine (SYNTHROID, LEVOTHROID) 50 MCG tablet Take 50 mcg by mouth daily before breakfast.   Yes [provider]  LORazepam (ATIVAN) 0.5 MG tablet Take 0.5 mg by mouth at  bedtime.   Yes [provider]  losartan (COZAAR) 50 MG tablet Take 50 mg by mouth daily.   Yes [provider]  MAGNESIUM PO Take 1 tablet by mouth daily.   Yes [provider]  MELATONIN PO Take 1 mL by mouth at bedtime.   Yes [provider]  Multiple Vitamin (MULTIVITAMIN WITH MINERALS) TABS tablet Take 1 tablet by mouth daily.   Yes [provider]  Multiple Vitamins-Minerals (HAIR SKIN AND NAILS FORMULA PO) Take 1 tablet by mouth daily.   Yes [provider]  omeprazole (PRILOSEC) 20 MG capsule Take  20 mg by mouth daily as needed (for reflux).   Yes [provider]  S-Adenosylmethionine (SAM-E PO) Take 1 tablet by mouth daily.   Yes [provider]  SELENIUM PO Take 1 tablet by mouth daily.   Yes [provider]  TURMERIC CURCUMIN PO Take 1 tablet by mouth daily.    Yes [provider]    Family History Family History  Problem Relation Age of Onset  . Hypertension Mother   . Prostate cancer Father     Social History Social History   Tobacco Use  . Smoking status: Never Smoker  . Smokeless tobacco: Never Used  Substance Use Topics  . Alcohol use: Yes    Frequency: Never    Comment: Once every three months   . Drug use: No     Allergies   Hydrochlorothiazide; Ramipril; and Remeron [mirtazapine]   Review of Systems Review of Systems  Constitutional: Negative for chills, diaphoresis and fever.  Respiratory: Negative for cough and shortness of breath.   Cardiovascular: Positive for chest pain. Negative for leg swelling.  Gastrointestinal: Negative for abdominal pain, diarrhea, nausea and vomiting.  Genitourinary: Negative for dysuria and hematuria.  Neurological: Negative for dizziness, syncope, weakness, light-headedness, numbness and headaches.  All other systems reviewed and are negative.    Physical Exam Updated Vital Signs BP (!) 157/71 (BP Location: Left Arm)   Pulse 69   Temp 98.1 F (36.7 C) (Oral)   Resp 20   Ht 5\' 4"  (1.626 m)   Wt 69.9 kg (154 lb)   SpO2 100%   BMI 26.43 kg/m   Physical Exam  Constitutional: She appears well-developed and well-nourished. No distress.  HENT:  Head: Normocephalic and atraumatic.  Eyes: Conjunctivae are normal.  Neck: Neck supple.  Cardiovascular: Normal rate, regular rhythm, normal heart sounds and intact distal pulses.  Pulmonary/Chest: Effort normal and breath sounds normal. No respiratory distress.  No increased work of breathing. Speaks in full sentences without  difficulty.   Abdominal: Soft. There is no tenderness. There is no guarding.  Musculoskeletal: She exhibits no edema.  Lymphadenopathy:    She has no cervical adenopathy.  Neurological: She is alert.  Skin: Skin is warm and dry. Capillary refill takes less than 2 seconds. She is not diaphoretic.  Psychiatric: She has a normal mood and affect. Her behavior is normal.  Nursing note and vitals reviewed.    ED Treatments / Results  Labs (all labs ordered are listed, but only abnormal results are displayed) Labs Reviewed  BASIC METABOLIC PANEL - Abnormal; Notable for the following components:      Result Value   Sodium 129 (*)    Chloride 97 (*)    Glucose, Bld 157 (*)    Calcium 8.6 (*)    All other components within normal limits  CBC - Abnormal; Notable for the following components:  RBC 3.37 (*)    Hemoglobin 11.3 (*)    HCT 33.3 (*)    All other components within normal limits  I-STAT TROPONIN, ED  I-STAT TROPONIN, ED    EKG  EKG Interpretation  Date/Time:  Tuesday January 30 2017 18:24:47 EST Ventricular Rate:  68 PR Interval:    QRS Duration: 136 QT Interval:  445 QTC Calculation: 474 R Axis:   5 Text Interpretation:  Sinus rhythm Left bundle branch block Nonspecific ST and T wave abnormality No acute changes No significant change since last tracing Confirmed by Derwood Kaplan ([id number]) on 01/30/2017 6:47:19 PM       Radiology Dg Chest 2 View  Result Date: 01/30/2017 CLINICAL DATA:  Chest pain.  Elevated D-dimer.  Hypertension. EXAM: CHEST  2 VIEW COMPARISON:  06/22/2013 mil FINDINGS: right hemidiaphragm elevation. Midline trachea. Normal heart size. Atherosclerosis in the transverse aorta. No pleural effusion or pneumothorax. Mild biapical pleural thickening. Clear lungs. Numerous leads and wires project over the chest. IMPRESSION: No acute cardiopulmonary disease. Aortic Atherosclerosis (ICD10-I70.0). Electronically Signed   By: Jeronimo Greaves M.D.   On:  01/30/2017 18:52   Ct Angio Chest Pe W And/or Wo Contrast  Result Date: 01/30/2017 CLINICAL DATA:  Central chest pain. Positive D-dimer. Pulmonary embolus suspected, intermediate probability. EXAM: CT ANGIOGRAPHY CHEST WITH CONTRAST TECHNIQUE: Multidetector CT imaging of the chest was performed using the standard protocol during bolus administration of intravenous contrast. Multiplanar CT image reconstructions and MIPs were obtained to evaluate the vascular anatomy. CONTRAST:  75mL ISOVUE-370 IOPAMIDOL (ISOVUE-370) INJECTION 76% COMPARISON:  Chest radiograph earlier this day. FINDINGS: Cardiovascular: There are no filling defects within the pulmonary arteries to suggest pulmonary embolus. Atherosclerosis and tortuosity of the thoracic aorta. No aortic dissection. Heart is normal in size. Minimal pericardial fluid. Mediastinum/Nodes: No enlarged mediastinal or hilar nodes. The esophagus is decompressed. Visualized thyroid gland is normal. Lungs/Pleura: Moderate bronchial thickening. Geographic ground-glass opacities in the upper lobes with minimal smooth septal thickening. Mild hypoventilatory atelectasis. No pulmonary mass. No suspicious nodule. No pleural fluid. Upper Abdomen: No acute abnormality. Musculoskeletal: There are no acute or suspicious osseous abnormalities. Diffuse disc space narrowing and endplate spurring. Peripherally calcified disc osteophyte complex at T7-T8 causes mass effect on the spinal canal. Review of the MIP images confirms the above findings. IMPRESSION: 1. No pulmonary embolus. 2. Patchy upper lobe ground-glass opacities with bronchial and minimal smooth septal thickening, constellation of findings suggests mild pulmonary edema. Infectious/inflammatory etiologies are also considered. 3. Aortic atherosclerosis and tortuosity.  No dissection. Aortic Atherosclerosis (ICD10-I70.0). Electronically Signed   By: Rubye Oaks M.D.   On: 01/30/2017 21:19    Procedures Procedures  (including critical care time)  Medications Ordered in ED Medications  iopamidol (ISOVUE-370) 76 % injection (not administered)  sodium chloride 0.9 % bolus 1,000 mL (0 mLs Intravenous Stopped 01/30/17 2137)  iopamidol (ISOVUE-370) 76 % injection 75 mL (75 mLs Intravenous Contrast Given 01/30/17 2051)     Initial Impression / Assessment and Plan / ED Course  I have reviewed the triage vital signs and the nursing notes.  Pertinent labs & imaging results that were available during my care of the patient were reviewed by me and considered in my medical decision making (see chart for details).  Clinical Course as of Jan 30 2225  Tue Jan 30, 2017  1916 Patient states hyponatremia is a chronic issue for her. Sodium: (!) 129 [SJ]    Clinical Course User Index [SJ] Birdella Sippel C, PA-C  Patient presents with intermittent atypical chest pain.  Low suspicion for ACS.  Delta troponins negative.  No PE on CT.  Incidental findings on CT do not seem to correlate clinically.  PCP and cardiology follow-up. The patient was given instructions for home care as well as return precautions. Patient voices understanding of these instructions, accepts the plan, and is comfortable with discharge.  Patient states she had a cardiac cath 15 years ago and it was normal. Echo performed 06/24/14 shows EF 55-60%, mild mitral regurg, RV SP increased consistent with mild pulmonary HTN.   Findings and plan of care discussed with Chaney Malling, MD. Dr. Silverio Lay personally evaluated and examined this patient.  Vitals:   01/30/17 1818 01/30/17 1925 01/30/17 1930 01/30/17 2130  BP: (!) 157/71 (!) 165/75 (!) 164/69 (!) 185/83  Pulse: 69 69 72 80  Resp: 20 16 (!) 22 19  Temp: 98.1 F (36.7 C)     TempSrc: Oral     SpO2: 100% 99% 94% 99%  Weight:      Height:         Final Clinical Impressions(s) / ED Diagnoses   Final diagnoses:  Atypical chest pain    ED Discharge Orders    None       Concepcion Living 01/30/17 2226    Charlynne Pander, MD 01/30/17 [phone]

## 2017-01-30 NOTE — ED Triage Notes (Signed)
Patient was seen by Dr. Zettie Cooley for chest pain since Thursday on today's visit. She was referred to [facility] Emergency Department for further evaluation due to abnormal lab values of d-dimer 0.63 and CPK 220.

## 2017-04-03 ENCOUNTER — Other Ambulatory Visit: Payer: Self-pay | Admitting: Geriatric Medicine

## 2017-04-03 DIAGNOSIS — R269 Unspecified abnormalities of gait and mobility: Secondary | ICD-10-CM

## 2017-04-09 ENCOUNTER — Other Ambulatory Visit: Payer: Medicare Other

## 2018-10-02 ENCOUNTER — Other Ambulatory Visit: Payer: Self-pay

## 2018-10-02 ENCOUNTER — Other Ambulatory Visit: Payer: Self-pay | Admitting: Geriatric Medicine

## 2018-10-02 ENCOUNTER — Emergency Department (HOSPITAL_COMMUNITY)
Admission: EM | Admit: 2018-10-02 | Discharge: 2018-10-02 | Disposition: A | Discharge status: Home / Self Care | Payer: Medicare Other | Attending: Emergency Medicine | Admitting: Emergency Medicine

## 2018-10-02 ENCOUNTER — Emergency Department (HOSPITAL_COMMUNITY): Payer: Medicare Other

## 2018-10-02 ENCOUNTER — Encounter (HOSPITAL_COMMUNITY): Payer: Self-pay | Admitting: Emergency Medicine

## 2018-10-02 DIAGNOSIS — R1013 Epigastric pain: Secondary | ICD-10-CM | POA: Diagnosis present

## 2018-10-02 DIAGNOSIS — E039 Hypothyroidism, unspecified: Secondary | ICD-10-CM | POA: Insufficient documentation

## 2018-10-02 DIAGNOSIS — R42 Dizziness and giddiness: Secondary | ICD-10-CM | POA: Insufficient documentation

## 2018-10-02 DIAGNOSIS — K2970 Gastritis, unspecified, without bleeding: Secondary | ICD-10-CM | POA: Diagnosis not present

## 2018-10-02 DIAGNOSIS — K2900 Acute gastritis without bleeding: Secondary | ICD-10-CM

## 2018-10-02 DIAGNOSIS — E860 Dehydration: Secondary | ICD-10-CM | POA: Diagnosis not present

## 2018-10-02 DIAGNOSIS — Z79899 Other long term (current) drug therapy: Secondary | ICD-10-CM | POA: Insufficient documentation

## 2018-10-02 DIAGNOSIS — I10 Essential (primary) hypertension: Secondary | ICD-10-CM | POA: Insufficient documentation

## 2018-10-02 LAB — LIPASE, BLOOD: Lipase: 23 U/L (ref 11–51)

## 2018-10-02 LAB — CBC WITH DIFFERENTIAL/PLATELET
Abs Immature Granulocytes: 0.02 10*3/uL (ref 0.00–0.07)
Basophils Absolute: 0.1 10*3/uL (ref 0.0–0.1)
Basophils Relative: 1 %
Eosinophils Absolute: 0.2 10*3/uL (ref 0.0–0.5)
Eosinophils Relative: 3 %
HCT: 37.1 % (ref 36.0–46.0)
Hemoglobin: 12.2 g/dL (ref 12.0–15.0)
Immature Granulocytes: 0 %
Lymphocytes Relative: 23 %
Lymphs Abs: 1.6 10*3/uL (ref 0.7–4.0)
MCH: 34.7 pg — ABNORMAL HIGH (ref 26.0–34.0)
MCHC: 32.9 g/dL (ref 30.0–36.0)
MCV: 105.4 fL — ABNORMAL HIGH (ref 80.0–100.0)
Monocytes Absolute: 0.7 10*3/uL (ref 0.1–1.0)
Monocytes Relative: 11 %
Neutro Abs: 4.2 10*3/uL (ref 1.7–7.7)
Neutrophils Relative %: 62 %
Platelets: 232 10*3/uL (ref 150–400)
RBC: 3.52 MIL/uL — ABNORMAL LOW (ref 3.87–5.11)
RDW: 12.6 % (ref 11.5–15.5)
WBC: 6.8 10*3/uL (ref 4.0–10.5)
nRBC: 0 % (ref 0.0–0.2)

## 2018-10-02 LAB — COMPREHENSIVE METABOLIC PANEL
ALT: 19 U/L (ref 0–44)
AST: 23 U/L (ref 15–41)
Albumin: 3.3 g/dL — ABNORMAL LOW (ref 3.5–5.0)
Alkaline Phosphatase: 57 U/L (ref 38–126)
Anion gap: 10 (ref 5–15)
BUN: 19 mg/dL (ref 8–23)
CO2: 24 mmol/L (ref 22–32)
Calcium: 9.2 mg/dL (ref 8.9–10.3)
Chloride: 99 mmol/L (ref 98–111)
Creatinine, Ser: 1.12 mg/dL — ABNORMAL HIGH (ref 0.44–1.00)
GFR calc Af Amer: 53 mL/min — ABNORMAL LOW (ref >60)
GFR calc non Af Amer: 45 mL/min — ABNORMAL LOW (ref >60)
Glucose, Bld: 114 mg/dL — ABNORMAL HIGH (ref 70–99)
Potassium: 4.1 mmol/L (ref 3.5–5.1)
Sodium: 133 mmol/L — ABNORMAL LOW (ref 135–145)
Total Bilirubin: 0.6 mg/dL (ref 0.3–1.2)
Total Protein: 6.3 g/dL — ABNORMAL LOW (ref 6.5–8.1)

## 2018-10-02 IMAGING — US ULTRASOUND ABDOMEN LIMITED
1 series · 14 of 24 positions shown · non-contrast
Comparison: None.

CLINICAL DATA: Epigastric pain for several weeks

EXAM:
ULTRASOUND ABDOMEN LIMITED RIGHT UPPER QUADRANT

[Series 1: ultrasound abdomen limited · 14 of 24 slices shown]
[im 1/24]
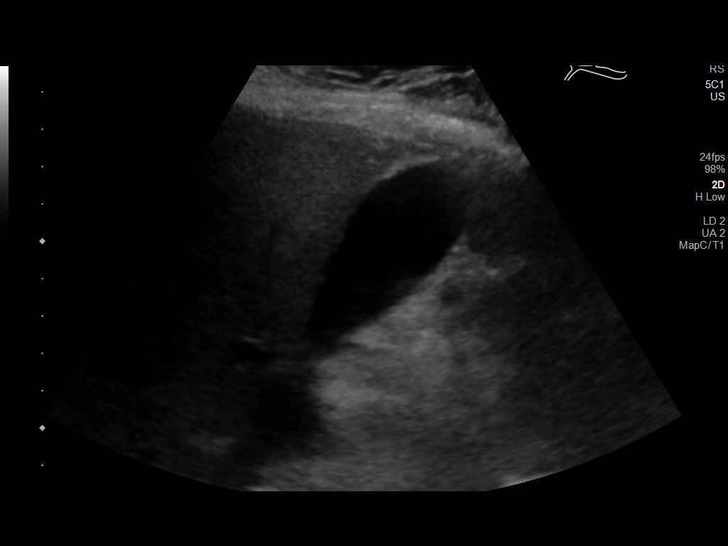
[im 3/24]
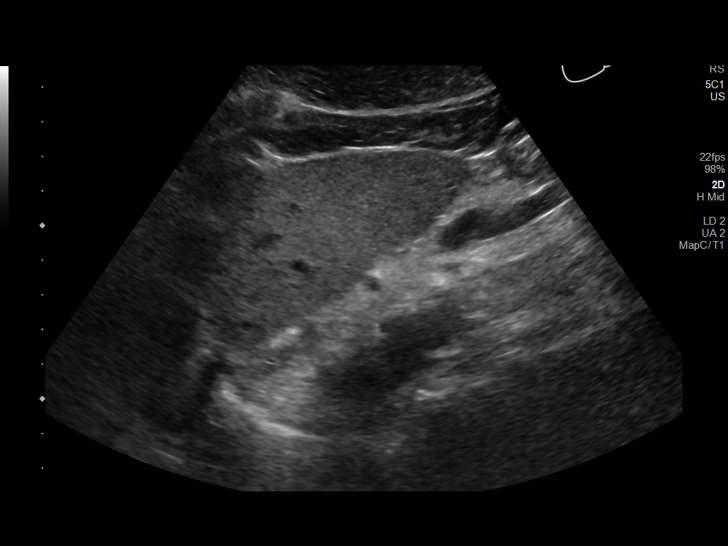
[im 5/24]
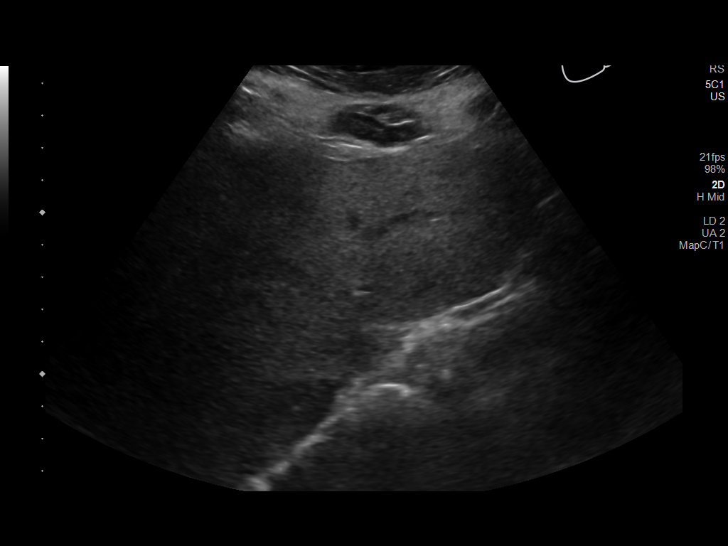
[im 7/24]
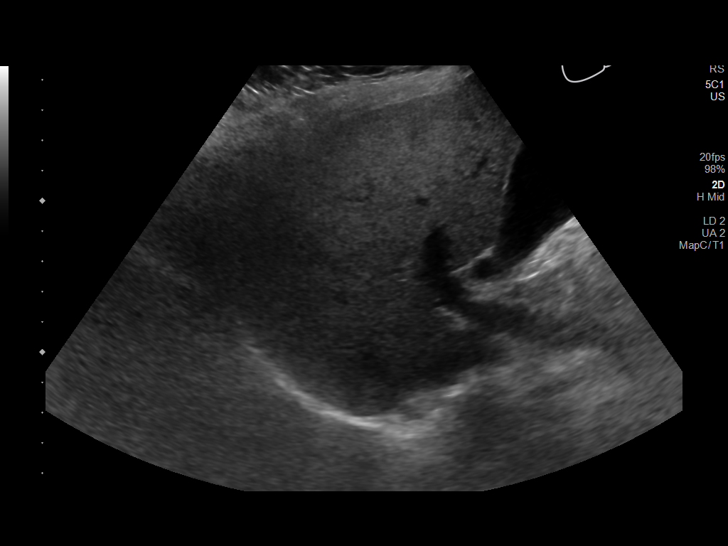
[im 8/24]
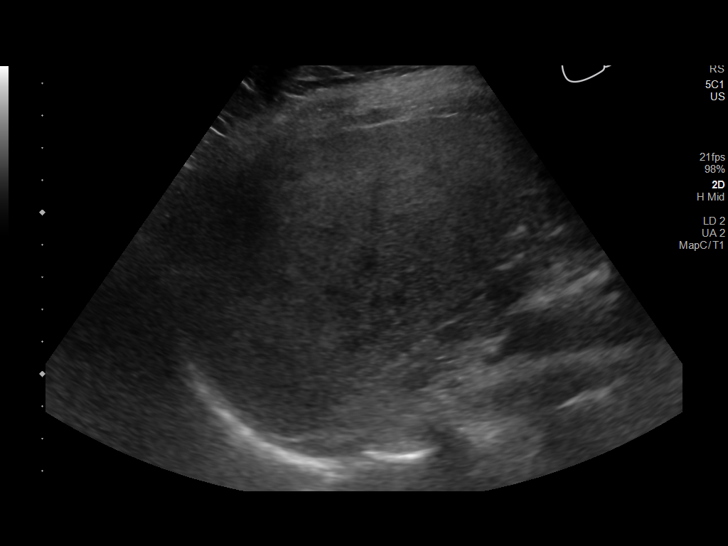
[im 10/24]
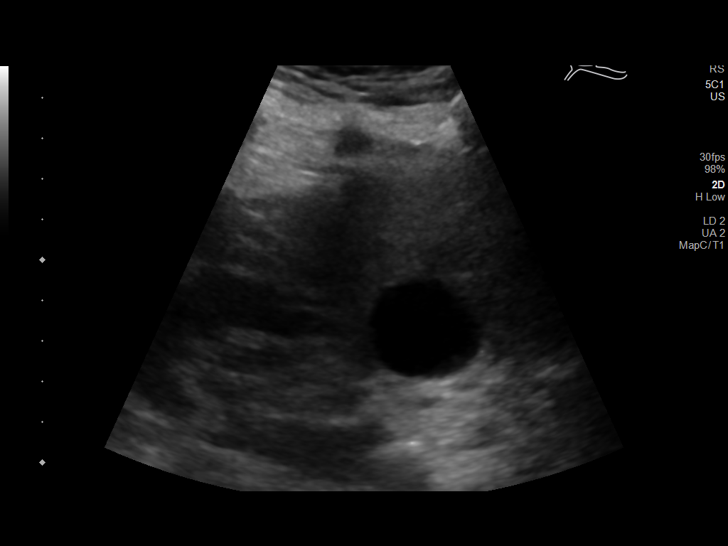
[im 12/24]
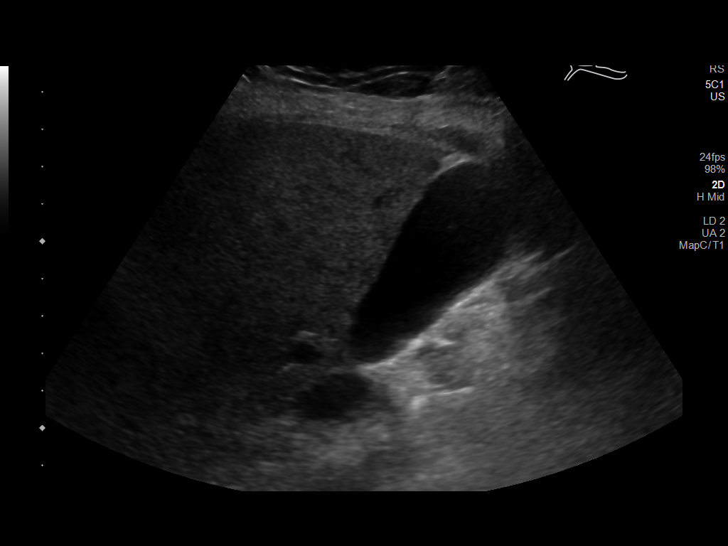
[im 13/24]
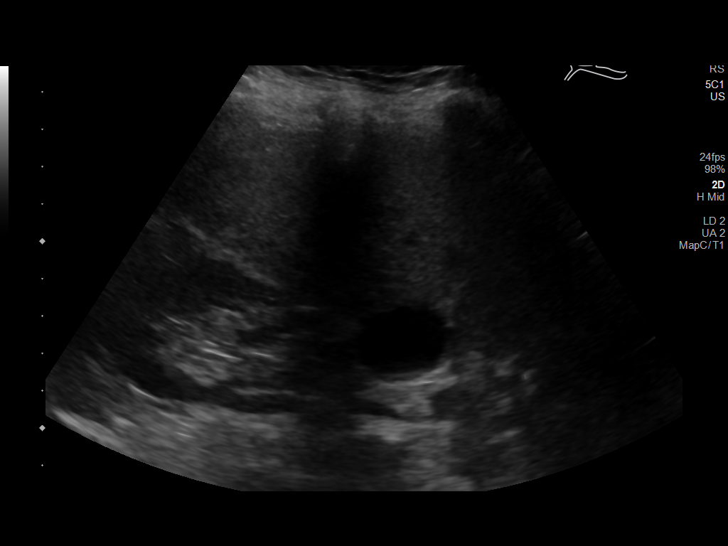
[im 15/24]
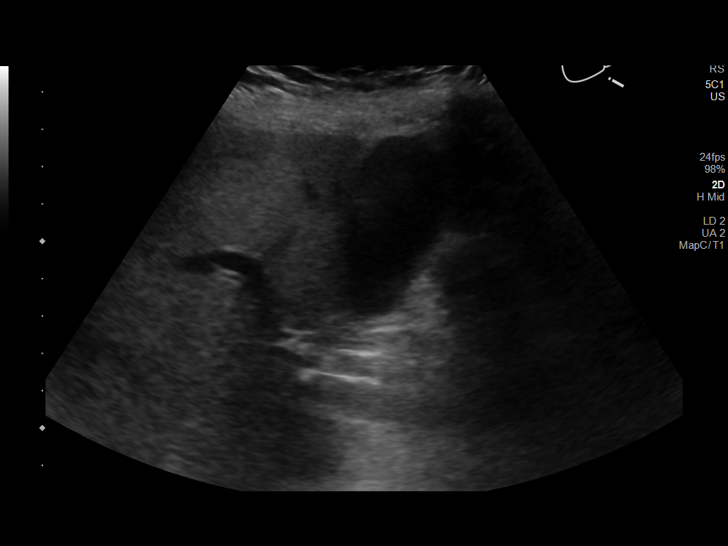
[im 17/24]
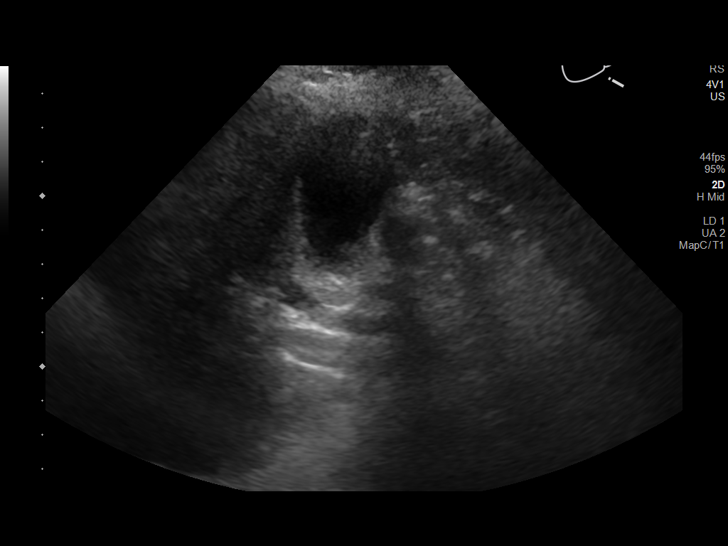
[im 19/24]
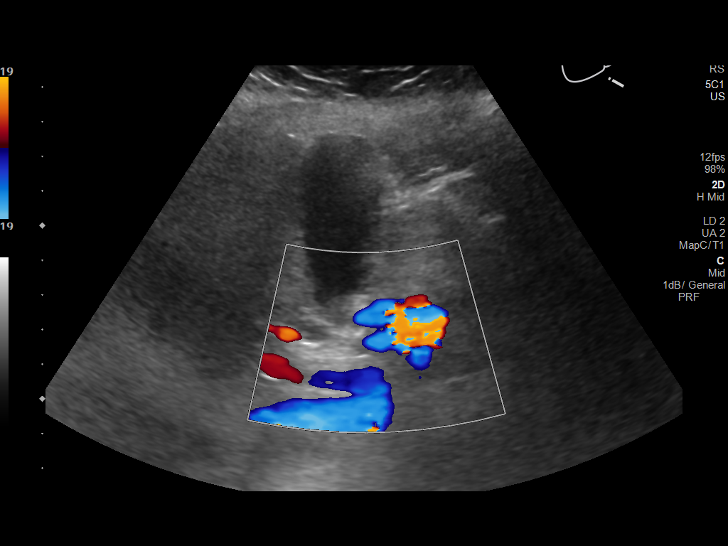
[im 20/24]
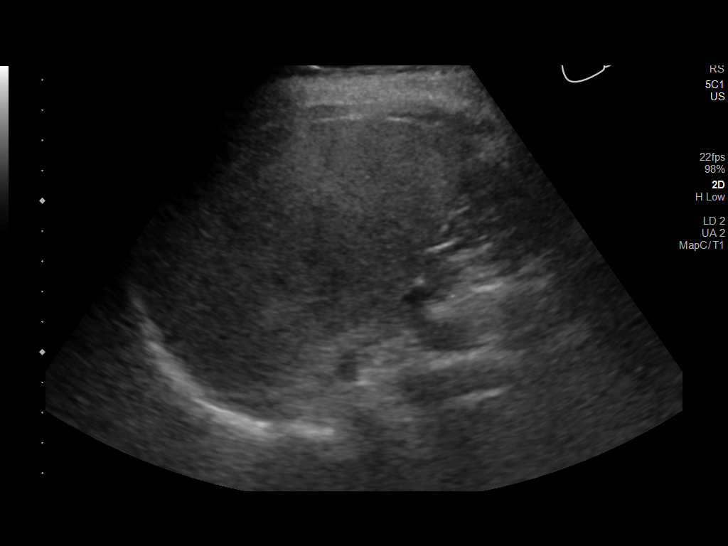
[im 22/24]
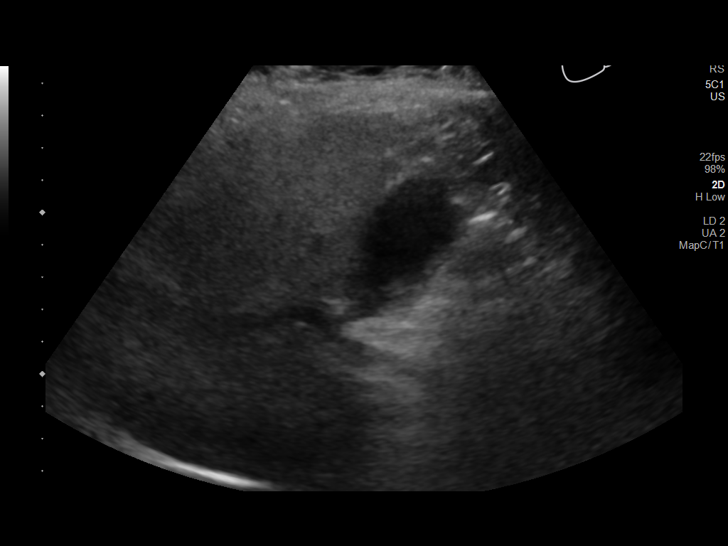
[im 24/24]
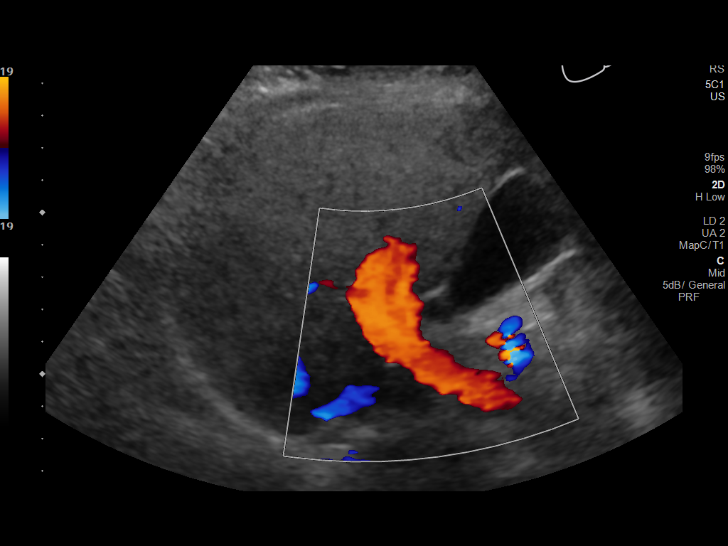

[14 of 24 positions shown; findings below may reference images not displayed]

FINDINGS: Gallbladder:

No gallstones or wall thickening visualized. No sonographic Murphy
sign noted by sonographer.

Common bile duct:

Diameter: 5.6 mm

Liver:

Generalized increased echogenicity is noted. This is likely related
to fatty infiltration. Portal vein is patent on color Doppler
imaging with normal direction of blood flow towards the liver.

Other: None.
IMPRESSION: Fatty liver.

No acute abnormality noted.

## 2018-10-02 MED ORDER — DICLOFENAC SODIUM 1 % TD GEL: 4.0000 g | Freq: Four times a day (QID) | TRANSDERMAL | 1 refills | Status: DC

## 2018-10-02 MED ORDER — SODIUM CHLORIDE 0.9 % IV BOLUS
1000.0000 mL | Freq: Once | INTRAVENOUS | Status: AC
  Administered 2018-10-02: 1000 mL via INTRAVENOUS

## 2018-10-02 NOTE — ED Notes (Signed)
Patient transported to Ultrasound 

## 2018-10-02 NOTE — ED Provider Notes (Signed)
[facility]   CSN: [id number] Arrival date & time: 10/02/18  [id number]     History   Chief Complaint Chief Complaint  Patient presents with  . Abdominal Pain  . Near Syncope    HPI Autumn KillianKaren M Mumford is a 85 y.o. female.     Patient is an 85 year old female with a history of hypertension, chronic left bundle branch block, GERD who is presenting today with abdominal pain and near syncope at the doctor's office.  Patient states the abdominal pain has been ongoing for the last 3 weeks.  She describes it as a sharp uncomfortable pain in the epigastric area that usually occurs after eating.  And normally only last 3 or 4 minutes but sometimes can last a little bit longer and usually occurs after eating.  The pain does not radiate and has not moved in the last 3 weeks.  She denies having any pain at this time.  The last time she had the pain was maybe a little bit this morning after eating a bowl of cereal.  She went to see her doctor today because of this pain and when she was checking out at the counter she started to feel very lightheaded and woozy.  She denied any chest pain, shortness of breath, palpitations during this episode.  The medical staff lowered her to the floor and found her blood pressure to be in the 70s systolic.  She improved with laying down but then when they attempted to set her back up after she had been sitting for only a few minutes her blood pressure dropped again.  Currently she denies any symptoms of being lightheaded and states she feels fine.  Patient does admit to taking 200 mg of ibuprofen every 6 hours for years but has not noticed any dark stools and has no prior history of stomach ulcers.  The history is provided by the patient.  Abdominal Pain Pain location:  Epigastric Pain quality: aching and cramping   Pain radiates to:  Does not radiate Pain severity:  Moderate Onset quality:  Sudden Duration:  3 weeks  Timing:  Intermittent Progression:  Resolved Chronicity:  New Context: eating   Relieved by:  None tried Worsened by:  Eating Ineffective treatments:  None tried Associated symptoms: anorexia   Associated symptoms: no chest pain, no constipation, no cough, no diarrhea, no dysuria, no fever, no hematuria, no nausea, no shortness of breath and no vomiting   Risk factors: being elderly   Near Syncope Associated symptoms include abdominal pain. Pertinent negatives include no chest pain and no shortness of breath.    Past Medical History:  Diagnosis Date  . Anxiety   . GERD (gastroesophageal reflux disease)   . Hypertension   . Hypothyroid   . LBBB (left bundle branch block)   . Osteopenia   . Peripheral neuropathy   . Venous insufficiency     Patient Active Problem List   Diagnosis Date Noted  . Normal coronary arteries 09/19/2013  . LBBB (left bundle branch block) 09/19/2013  . Congestive dilated cardiomyopathy (HCC) 07/24/2013    Past Surgical History:  Procedure Laterality Date  . ABDOMINAL HYSTERECTOMY    . BLADDER SURGERY    . LEFT HEART CATHETERIZATION WITH CORONARY ANGIOGRAM N/A 09/02/2013   Procedure: LEFT HEART CATHETERIZATION WITH CORONARY ANGIOGRAM;  Surgeon: Rollene RotundaJames Hochrein, MD;  [facility];  Service: Cardiovascular;  Laterality: N/A;  . RENAL ANGIOGRAM N/A 09/02/2013   Procedure: RENAL  ANGIOGRAM;  Surgeon: Rollene RotundaJames Hochrein, MD;  [facility];  Service: Cardiovascular;  Laterality: N/A;     OB History   No obstetric history on file.      Home Medications    Prior to Admission medications   Medication Sig Start Date End Date Taking? Authorizing Provider  Ascorbic Acid (VITAMIN C PO) Take 1 tablet by mouth daily.    [provider]  carvedilol (COREG) 6.25 MG tablet Take 1 tablet (6.25 mg total) by mouth 2 (two) times daily. 06/23/14   Rollene RotundaHochrein, James, MD  Cholecalciferol (VITAMIN D PO) Take 1 tablet by mouth daily.    [provider]  CINNAMON PO Take 1 tablet by mouth daily.    [provider]  Cyanocobalamin (VITAMIN B-12 PO) Take 1 tablet by mouth daily.    [provider]  estradiol (ESTRACE) 1 MG tablet Take 1 mg by mouth daily.    [provider]  Glucosamine HCl (GLUCOSAMINE PO) Take 1 tablet by mouth daily.    [provider]  HYDROcodone-acetaminophen (NORCO/VICODIN) 5-325 MG per tablet Take 1 tablet by mouth daily as needed for moderate pain.    [provider]  KRILL OIL PO Take 1 capsule by mouth daily.    [provider]  levothyroxine (SYNTHROID, LEVOTHROID) 50 MCG tablet Take 50 mcg by mouth daily before breakfast.    [provider]  LORazepam (ATIVAN) 0.5 MG tablet Take 0.5 mg by mouth at bedtime.    [provider]  losartan (COZAAR) 50 MG tablet Take 50 mg by mouth daily.    [provider]  MAGNESIUM PO Take 1 tablet by mouth daily.    [provider]  MELATONIN PO Take 1 mL by mouth at bedtime.    [provider]  Multiple Vitamin (MULTIVITAMIN WITH MINERALS) TABS tablet Take 1 tablet by mouth daily.    [provider]  Multiple Vitamins-Minerals (HAIR SKIN AND NAILS FORMULA PO) Take 1 tablet by mouth daily.    [provider]  omeprazole (PRILOSEC) 20 MG capsule Take 20 mg by mouth daily as needed (for reflux).    [provider]  S-Adenosylmethionine (SAM-E PO) Take 1 tablet by mouth daily.    [provider]  SELENIUM PO Take 1 tablet by mouth daily.    [provider]  TURMERIC CURCUMIN PO Take 1 tablet by mouth daily.     [provider]    Family History Family History  Problem Relation Age of Onset  . Hypertension Mother   . Prostate cancer Father     Social History Social History   Tobacco Use  . Smoking status: Never Smoker  . Smokeless tobacco: Never Used  Substance Use Topics  . Alcohol use: Yes    Frequency: Never     Comment: Once every three months   . Drug use: No     Allergies   Hydrochlorothiazide, Ramipril, and Remeron [mirtazapine]   Review of Systems Review of Systems  Constitutional: Negative for fever.  Respiratory: Negative for cough and shortness of breath.   Cardiovascular: Positive for near-syncope. Negative for chest pain.  Gastrointestinal: Positive for abdominal pain and anorexia. Negative for constipation, diarrhea, nausea and vomiting.  Genitourinary: Negative for dysuria and hematuria.  All other systems reviewed and are negative.    Physical Exam Updated Vital Signs BP (!) 142/67   Pulse 64   Temp 98 F (36.7 C) (Oral)   Resp 15  Ht 5\' 3"  (1.6 m)   Wt 68 kg   SpO2 99%   BMI 26.57 kg/m   Physical Exam Vitals signs and nursing note reviewed.  Constitutional:      General: She is not in acute distress.    Appearance: She is well-developed and normal weight.  HENT:     Head: Normocephalic and atraumatic.     Mouth/Throat:     Mouth: Mucous membranes are moist.  Eyes:     Conjunctiva/sclera: Conjunctivae normal.     Pupils: Pupils are equal, round, and reactive to light.  Neck:     Musculoskeletal: Normal range of motion and neck supple.  Cardiovascular:     Rate and Rhythm: Normal rate and regular rhythm.     Heart sounds: No murmur.  Pulmonary:     Effort: Pulmonary effort is normal. No respiratory distress.     Breath sounds: Normal breath sounds. No wheezing or rales.  Abdominal:     General: Bowel sounds are normal. There is no distension.     Palpations: Abdomen is soft.     Tenderness: There is no abdominal tenderness. There is no right CVA tenderness, left CVA tenderness, guarding or rebound. Negative signs include Murphy's sign.  Musculoskeletal: Normal range of motion.        General: No tenderness.  Skin:    General: Skin is warm and dry.     Capillary Refill: Capillary refill takes less than 2 seconds.     Findings: No erythema or rash.   Neurological:     Mental Status: She is alert and oriented to person, place, and time.  Psychiatric:        Mood and Affect: Mood normal.        Behavior: Behavior normal.      ED Treatments / Results  Labs (all labs ordered are listed, but only abnormal results are displayed) Labs Reviewed  CBC WITH DIFFERENTIAL/PLATELET - Abnormal; Notable for the following components:      Result Value   RBC 3.52 (*)    MCV 105.4 (*)    MCH 34.7 (*)    All other components within normal limits  COMPREHENSIVE METABOLIC PANEL - Abnormal; Notable for the following components:   Sodium 133 (*)    Glucose, Bld 114 (*)    Creatinine, Ser 1.12 (*)    Total Protein 6.3 (*)    Albumin 3.3 (*)    GFR calc non Af Amer 45 (*)    GFR calc Af Amer 53 (*)    All other components within normal limits  LIPASE, BLOOD    EKG EKG Interpretation  Date/Time:  Wednesday October 02 2018 09:33:31 EDT Ventricular Rate:  62 PR Interval:    QRS Duration: 137 QT Interval:  462 QTC Calculation: 470 R Axis:   -6 Text Interpretation:  Sinus rhythm Left bundle branch block No significant change since last tracing Confirmed by Blanchie Dessert [phone]) on 10/02/2018 9:56:36 AM   Radiology US Abdomen Limited Ruq  Result Date: 10/02/2018 CLINICAL DATA:  Epigastric pain for several weeks EXAM: ULTRASOUND ABDOMEN LIMITED RIGHT UPPER QUADRANT COMPARISON:  None. FINDINGS: Gallbladder: No gallstones or wall thickening visualized. No sonographic Murphy sign noted by sonographer. Common bile duct: Diameter: 5.6 mm Liver: Generalized increased echogenicity is noted. This is likely related to fatty infiltration. Portal vein is patent on color Doppler imaging with normal direction of blood flow towards the liver. Other: None. IMPRESSION: Fatty liver. No acute abnormality noted. Electronically Signed  By: Alcide Clever M.D.   On: 10/02/2018 12:41    Procedures Procedures (including critical care time)  Medications Ordered  in ED Medications - No data to display   Initial Impression / Assessment and Plan / ED Course  I have reviewed the triage vital signs and the nursing notes.  Pertinent labs & imaging results that were available during my care of the patient were reviewed by me and considered in my medical decision making (see chart for details).        Elderly female presenting today with near syncope at her doctor's office today with evidence of orthostatic hypotension during the event.  Also patient went to the doctor's office for 3 weeks of intermittent abdominal pain that occurs after eating.  Patient does take ibuprofen daily for years but denies any black stools.  Concern for anemia versus PUD versus gallbladder pathology.  Patient is denying any chest pain, palpitations or shortness of breath.  Blood pressure is now normal.  Patient does take blood pressure medication and states she had a small bowl of cereal but has not been eating or drinking as much as she normally does because of the pain.  Patient's EKG today shows a left bundle branch block which is chronic and unchanged.  CBC, CMP, lipase and right upper quadrant ultrasound pending  1:24 PM Labs with mild AKI but otherwise renal and liver function are within normal limits.  Lipase is normal.  Ultrasound negative for kidney stones.  Suspect most likely peptic ulcer disease with patient's ongoing Advil use.  Patient does take omeprazole and recommended doubling her dose to 40 mg/day and stopping the ibuprofen.  Patient given a prescription for Voltaren gel.  Suspect dehydration as the cause of her near syncope today as patient was orthostatic and creatinine has bumped.  She admits to not eating or drinking as much due to the cause of pain.  After IV fluids patient is feeling better and ambulating without difficulty.  Blood pressure has remained stable.  Feel that patient is safe for discharge home.  Final Clinical Impressions(s) / ED Diagnoses   Final  diagnoses:  Epigastric pain  Dehydration  Acute gastritis without hemorrhage, unspecified gastritis type    ED Discharge Orders         Ordered    diclofenac sodium (VOLTAREN) 1 % GEL  4 times daily     10/02/18 1326           Gwyneth Sprout, MD 10/03/18 1512

## 2018-10-02 NOTE — ED Notes (Signed)
Pt ambulated to the bathroom with standby assist. Denied dizziness.

## 2018-10-02 NOTE — Discharge Instructions (Signed)
Avoid ibuprofen for the next few weeks.  Increase your omeprazole to 2 tablets (40mg ) for the next 2 weeks.  However if pain continues you will need to see a gastroenterologist.  Make sure you are drinking plenty of fluids so that you do not get dehydrated again.

## 2018-10-02 NOTE — ED Triage Notes (Signed)
Per [facility] pt coming from doctors office after having a near syncope episode. Report patient had a blank stare and bp palpated at 70.  Patient was at doctor for epigastric pain for past 3 weeks. Denies any urinary symptoms. Patient alert and orientated x 4. EMS reports positive orthostatics.

## 2018-11-08 ENCOUNTER — Other Ambulatory Visit: Payer: Self-pay | Admitting: Physician Assistant

## 2018-11-08 DIAGNOSIS — R1013 Epigastric pain: Secondary | ICD-10-CM

## 2018-11-08 DIAGNOSIS — K219 Gastro-esophageal reflux disease without esophagitis: Secondary | ICD-10-CM

## 2018-11-12 ENCOUNTER — Emergency Department (HOSPITAL_COMMUNITY): Payer: Medicare Other

## 2018-11-12 ENCOUNTER — Inpatient Hospital Stay (HOSPITAL_COMMUNITY)
Admission: EM | Admit: 2018-11-12 | Discharge: 2018-11-28 | DRG: 291 | Disposition: A | Discharge status: Skilled Nursing Facility | Payer: Medicare Other | Attending: Internal Medicine | Admitting: Internal Medicine

## 2018-11-12 ENCOUNTER — Encounter (HOSPITAL_COMMUNITY): Payer: Self-pay

## 2018-11-12 DIAGNOSIS — Z79890 Hormone replacement therapy (postmenopausal): Secondary | ICD-10-CM

## 2018-11-12 DIAGNOSIS — I447 Left bundle-branch block, unspecified: Secondary | ICD-10-CM | POA: Diagnosis present

## 2018-11-12 DIAGNOSIS — Z791 Long term (current) use of non-steroidal anti-inflammatories (NSAID): Secondary | ICD-10-CM

## 2018-11-12 DIAGNOSIS — Z20828 Contact with and (suspected) exposure to other viral communicable diseases: Secondary | ICD-10-CM | POA: Diagnosis present

## 2018-11-12 DIAGNOSIS — Z79899 Other long term (current) drug therapy: Secondary | ICD-10-CM

## 2018-11-12 DIAGNOSIS — E785 Hyperlipidemia, unspecified: Secondary | ICD-10-CM | POA: Diagnosis present

## 2018-11-12 DIAGNOSIS — M4186 Other forms of scoliosis, lumbar region: Secondary | ICD-10-CM | POA: Diagnosis present

## 2018-11-12 DIAGNOSIS — B9561 Methicillin susceptible Staphylococcus aureus infection as the cause of diseases classified elsewhere: Secondary | ICD-10-CM

## 2018-11-12 DIAGNOSIS — E872 Acidosis, unspecified: Secondary | ICD-10-CM | POA: Diagnosis present

## 2018-11-12 DIAGNOSIS — I951 Orthostatic hypotension: Secondary | ICD-10-CM | POA: Diagnosis present

## 2018-11-12 DIAGNOSIS — I110 Hypertensive heart disease with heart failure: Secondary | ICD-10-CM | POA: Diagnosis not present

## 2018-11-12 DIAGNOSIS — N1330 Unspecified hydronephrosis: Secondary | ICD-10-CM | POA: Diagnosis present

## 2018-11-12 DIAGNOSIS — I059 Rheumatic mitral valve disease, unspecified: Secondary | ICD-10-CM

## 2018-11-12 DIAGNOSIS — I10 Essential (primary) hypertension: Secondary | ICD-10-CM | POA: Diagnosis present

## 2018-11-12 DIAGNOSIS — U071 COVID-19: Secondary | ICD-10-CM

## 2018-11-12 DIAGNOSIS — R7881 Bacteremia: Secondary | ICD-10-CM

## 2018-11-12 DIAGNOSIS — E860 Dehydration: Secondary | ICD-10-CM | POA: Diagnosis present

## 2018-11-12 DIAGNOSIS — I058 Other rheumatic mitral valve diseases: Secondary | ICD-10-CM

## 2018-11-12 DIAGNOSIS — G629 Polyneuropathy, unspecified: Secondary | ICD-10-CM | POA: Diagnosis present

## 2018-11-12 DIAGNOSIS — I5043 Acute on chronic combined systolic (congestive) and diastolic (congestive) heart failure: Secondary | ICD-10-CM | POA: Diagnosis present

## 2018-11-12 DIAGNOSIS — F419 Anxiety disorder, unspecified: Secondary | ICD-10-CM | POA: Diagnosis present

## 2018-11-12 DIAGNOSIS — Z66 Do not resuscitate: Secondary | ICD-10-CM | POA: Diagnosis present

## 2018-11-12 DIAGNOSIS — I774 Celiac artery compression syndrome: Secondary | ICD-10-CM | POA: Diagnosis present

## 2018-11-12 DIAGNOSIS — I340 Nonrheumatic mitral (valve) insufficiency: Secondary | ICD-10-CM | POA: Diagnosis present

## 2018-11-12 DIAGNOSIS — E039 Hypothyroidism, unspecified: Secondary | ICD-10-CM

## 2018-11-12 DIAGNOSIS — R55 Syncope and collapse: Secondary | ICD-10-CM | POA: Diagnosis not present

## 2018-11-12 DIAGNOSIS — K219 Gastro-esophageal reflux disease without esophagitis: Secondary | ICD-10-CM | POA: Diagnosis present

## 2018-11-12 DIAGNOSIS — M542 Cervicalgia: Secondary | ICD-10-CM | POA: Diagnosis not present

## 2018-11-12 DIAGNOSIS — A4101 Sepsis due to Methicillin susceptible Staphylococcus aureus: Secondary | ICD-10-CM | POA: Diagnosis not present

## 2018-11-12 DIAGNOSIS — I428 Other cardiomyopathies: Secondary | ICD-10-CM | POA: Diagnosis present

## 2018-11-12 DIAGNOSIS — M25512 Pain in left shoulder: Secondary | ICD-10-CM | POA: Diagnosis not present

## 2018-11-12 DIAGNOSIS — R1010 Upper abdominal pain, unspecified: Secondary | ICD-10-CM

## 2018-11-12 DIAGNOSIS — I82612 Acute embolism and thrombosis of superficial veins of left upper extremity: Secondary | ICD-10-CM | POA: Diagnosis not present

## 2018-11-12 DIAGNOSIS — I248 Other forms of acute ischemic heart disease: Secondary | ICD-10-CM | POA: Diagnosis not present

## 2018-11-12 DIAGNOSIS — R42 Dizziness and giddiness: Secondary | ICD-10-CM

## 2018-11-12 DIAGNOSIS — Z79891 Long term (current) use of opiate analgesic: Secondary | ICD-10-CM

## 2018-11-12 DIAGNOSIS — M5124 Other intervertebral disc displacement, thoracic region: Secondary | ICD-10-CM | POA: Diagnosis present

## 2018-11-12 DIAGNOSIS — R0682 Tachypnea, not elsewhere classified: Secondary | ICD-10-CM | POA: Diagnosis present

## 2018-11-12 DIAGNOSIS — I76 Septic arterial embolism: Secondary | ICD-10-CM | POA: Diagnosis not present

## 2018-11-12 DIAGNOSIS — I313 Pericardial effusion (noninflammatory): Secondary | ICD-10-CM | POA: Diagnosis present

## 2018-11-12 DIAGNOSIS — Z90710 Acquired absence of both cervix and uterus: Secondary | ICD-10-CM

## 2018-11-12 DIAGNOSIS — E873 Alkalosis: Secondary | ICD-10-CM | POA: Diagnosis present

## 2018-11-12 DIAGNOSIS — R112 Nausea with vomiting, unspecified: Secondary | ICD-10-CM | POA: Diagnosis present

## 2018-11-12 DIAGNOSIS — I872 Venous insufficiency (chronic) (peripheral): Secondary | ICD-10-CM | POA: Diagnosis present

## 2018-11-12 DIAGNOSIS — I330 Acute and subacute infective endocarditis: Secondary | ICD-10-CM | POA: Diagnosis not present

## 2018-11-12 DIAGNOSIS — M8580 Other specified disorders of bone density and structure, unspecified site: Secondary | ICD-10-CM | POA: Diagnosis present

## 2018-11-12 DIAGNOSIS — R29704 NIHSS score 4: Secondary | ICD-10-CM | POA: Diagnosis not present

## 2018-11-12 DIAGNOSIS — I6340 Cerebral infarction due to embolism of unspecified cerebral artery: Secondary | ICD-10-CM | POA: Insufficient documentation

## 2018-11-12 DIAGNOSIS — Z515 Encounter for palliative care: Secondary | ICD-10-CM

## 2018-11-12 DIAGNOSIS — N179 Acute kidney failure, unspecified: Secondary | ICD-10-CM | POA: Diagnosis not present

## 2018-11-12 DIAGNOSIS — I2510 Atherosclerotic heart disease of native coronary artery without angina pectoris: Secondary | ICD-10-CM | POA: Diagnosis present

## 2018-11-12 DIAGNOSIS — M48061 Spinal stenosis, lumbar region without neurogenic claudication: Secondary | ICD-10-CM | POA: Diagnosis present

## 2018-11-12 DIAGNOSIS — Z8249 Family history of ischemic heart disease and other diseases of the circulatory system: Secondary | ICD-10-CM

## 2018-11-12 DIAGNOSIS — R471 Dysarthria and anarthria: Secondary | ICD-10-CM | POA: Diagnosis not present

## 2018-11-12 DIAGNOSIS — B9562 Methicillin resistant Staphylococcus aureus infection as the cause of diseases classified elsewhere: Secondary | ICD-10-CM

## 2018-11-12 DIAGNOSIS — Z751 Person awaiting admission to adequate facility elsewhere: Secondary | ICD-10-CM

## 2018-11-12 DIAGNOSIS — Z7982 Long term (current) use of aspirin: Secondary | ICD-10-CM

## 2018-11-12 DIAGNOSIS — R339 Retention of urine, unspecified: Secondary | ICD-10-CM | POA: Diagnosis present

## 2018-11-12 DIAGNOSIS — Z882 Allergy status to sulfonamides status: Secondary | ICD-10-CM

## 2018-11-12 DIAGNOSIS — E876 Hypokalemia: Secondary | ICD-10-CM | POA: Diagnosis not present

## 2018-11-12 DIAGNOSIS — I63442 Cerebral infarction due to embolism of left cerebellar artery: Secondary | ICD-10-CM | POA: Diagnosis not present

## 2018-11-12 DIAGNOSIS — Z8679 Personal history of other diseases of the circulatory system: Secondary | ICD-10-CM

## 2018-11-12 DIAGNOSIS — I272 Pulmonary hypertension, unspecified: Secondary | ICD-10-CM | POA: Diagnosis present

## 2018-11-12 DIAGNOSIS — G8929 Other chronic pain: Secondary | ICD-10-CM | POA: Diagnosis present

## 2018-11-12 DIAGNOSIS — Z888 Allergy status to other drugs, medicaments and biological substances status: Secondary | ICD-10-CM

## 2018-11-12 DIAGNOSIS — I4581 Long QT syndrome: Secondary | ICD-10-CM | POA: Diagnosis present

## 2018-11-12 DIAGNOSIS — R778 Other specified abnormalities of plasma proteins: Secondary | ICD-10-CM | POA: Diagnosis present

## 2018-11-12 DIAGNOSIS — E871 Hypo-osmolality and hyponatremia: Secondary | ICD-10-CM | POA: Diagnosis present

## 2018-11-12 DIAGNOSIS — R338 Other retention of urine: Secondary | ICD-10-CM

## 2018-11-12 LAB — CBC WITH DIFFERENTIAL/PLATELET
Abs Immature Granulocytes: 0.04 10*3/uL (ref 0.00–0.07)
Basophils Absolute: 0.1 10*3/uL (ref 0.0–0.1)
Basophils Relative: 1 %
Eosinophils Absolute: 0.1 10*3/uL (ref 0.0–0.5)
Eosinophils Relative: 1 %
HCT: 37 % (ref 36.0–46.0)
Hemoglobin: 12.3 g/dL (ref 12.0–15.0)
Immature Granulocytes: 0 %
Lymphocytes Relative: 24 %
Lymphs Abs: 2.3 10*3/uL (ref 0.7–4.0)
MCH: 34.4 pg — ABNORMAL HIGH (ref 26.0–34.0)
MCHC: 33.2 g/dL (ref 30.0–36.0)
MCV: 103.4 fL — ABNORMAL HIGH (ref 80.0–100.0)
Monocytes Absolute: 0.9 10*3/uL (ref 0.1–1.0)
Monocytes Relative: 9 %
Neutro Abs: 6.2 10*3/uL (ref 1.7–7.7)
Neutrophils Relative %: 65 %
Platelets: 262 10*3/uL (ref 150–400)
RBC: 3.58 MIL/uL — ABNORMAL LOW (ref 3.87–5.11)
RDW: 12.5 % (ref 11.5–15.5)
WBC: 9.6 10*3/uL (ref 4.0–10.5)
nRBC: 0 % (ref 0.0–0.2)

## 2018-11-12 LAB — COMPREHENSIVE METABOLIC PANEL
ALT: 16 U/L (ref 0–44)
AST: 26 U/L (ref 15–41)
Albumin: 3.9 g/dL (ref 3.5–5.0)
Alkaline Phosphatase: 42 U/L (ref 38–126)
Anion gap: 16 — ABNORMAL HIGH (ref 5–15)
BUN: 21 mg/dL (ref 8–23)
CO2: 18 mmol/L — ABNORMAL LOW (ref 22–32)
Calcium: 9.4 mg/dL (ref 8.9–10.3)
Chloride: 97 mmol/L — ABNORMAL LOW (ref 98–111)
Creatinine, Ser: 1.01 mg/dL — ABNORMAL HIGH (ref 0.44–1.00)
GFR calc Af Amer: 59 mL/min — ABNORMAL LOW (ref >60)
GFR calc non Af Amer: 51 mL/min — ABNORMAL LOW (ref >60)
Glucose, Bld: 156 mg/dL — ABNORMAL HIGH (ref 70–99)
Potassium: 4.5 mmol/L (ref 3.5–5.1)
Sodium: 131 mmol/L — ABNORMAL LOW (ref 135–145)
Total Bilirubin: 0.6 mg/dL (ref 0.3–1.2)
Total Protein: 7.1 g/dL (ref 6.5–8.1)

## 2018-11-12 LAB — URINALYSIS, ROUTINE W REFLEX MICROSCOPIC
Bilirubin Urine: NEGATIVE
Glucose, UA: NEGATIVE mg/dL
Hgb urine dipstick: NEGATIVE
Ketones, ur: 20 mg/dL — AB
Leukocytes,Ua: NEGATIVE
Nitrite: NEGATIVE
Protein, ur: NEGATIVE mg/dL
Specific Gravity, Urine: 1.012 (ref 1.005–1.030)
pH: 5 (ref 5.0–8.0)

## 2018-11-12 LAB — LACTIC ACID, PLASMA
Lactic Acid, Venous: 4.6 mmol/L (ref 0.5–1.9)
Lactic Acid, Venous: 3.8 mmol/L (ref 0.5–1.9)
Lactic Acid, Venous: 4.3 mmol/L (ref 0.5–1.9)
Lactic Acid, Venous: 3.5 mmol/L (ref 0.5–1.9)

## 2018-11-12 LAB — SARS CORONAVIRUS 2 (TAT 6-24 HRS): SARS Coronavirus 2: NEGATIVE

## 2018-11-12 LAB — TROPONIN I (HIGH SENSITIVITY)
Troponin I (High Sensitivity): 42 ng/L — ABNORMAL HIGH (ref <18)
Troponin I (High Sensitivity): 76 ng/L — ABNORMAL HIGH (ref <18)
Troponin I (High Sensitivity): 196 ng/L (ref <18)
Troponin I (High Sensitivity): 185 ng/L (ref <18)

## 2018-11-12 LAB — TSH: TSH: 0.778 u[IU]/mL (ref 0.350–4.500)

## 2018-11-12 LAB — BRAIN NATRIURETIC PEPTIDE: B Natriuretic Peptide: 65.8 pg/mL (ref 0.0–100.0)

## 2018-11-12 LAB — VITAMIN B12: Vitamin B-12: 931 pg/mL — ABNORMAL HIGH (ref 180–914)

## 2018-11-12 LAB — LIPASE, BLOOD: Lipase: 25 U/L (ref 11–51)

## 2018-11-12 LAB — CBG MONITORING, ED: Glucose-Capillary: 145 mg/dL — ABNORMAL HIGH (ref 70–99)

## 2018-11-12 LAB — SALICYLATE LEVEL: Salicylate Lvl: <7 mg/dL (ref 2.8–30.0)

## 2018-11-12 IMAGING — DX DG CHEST 1V PORT
1 series · 1 of 1 positions shown · non-contrast
Comparison: [DATE]

CLINICAL DATA: Tachypnea

EXAM:
PORTABLE CHEST 1 VIEW

[chest ap]
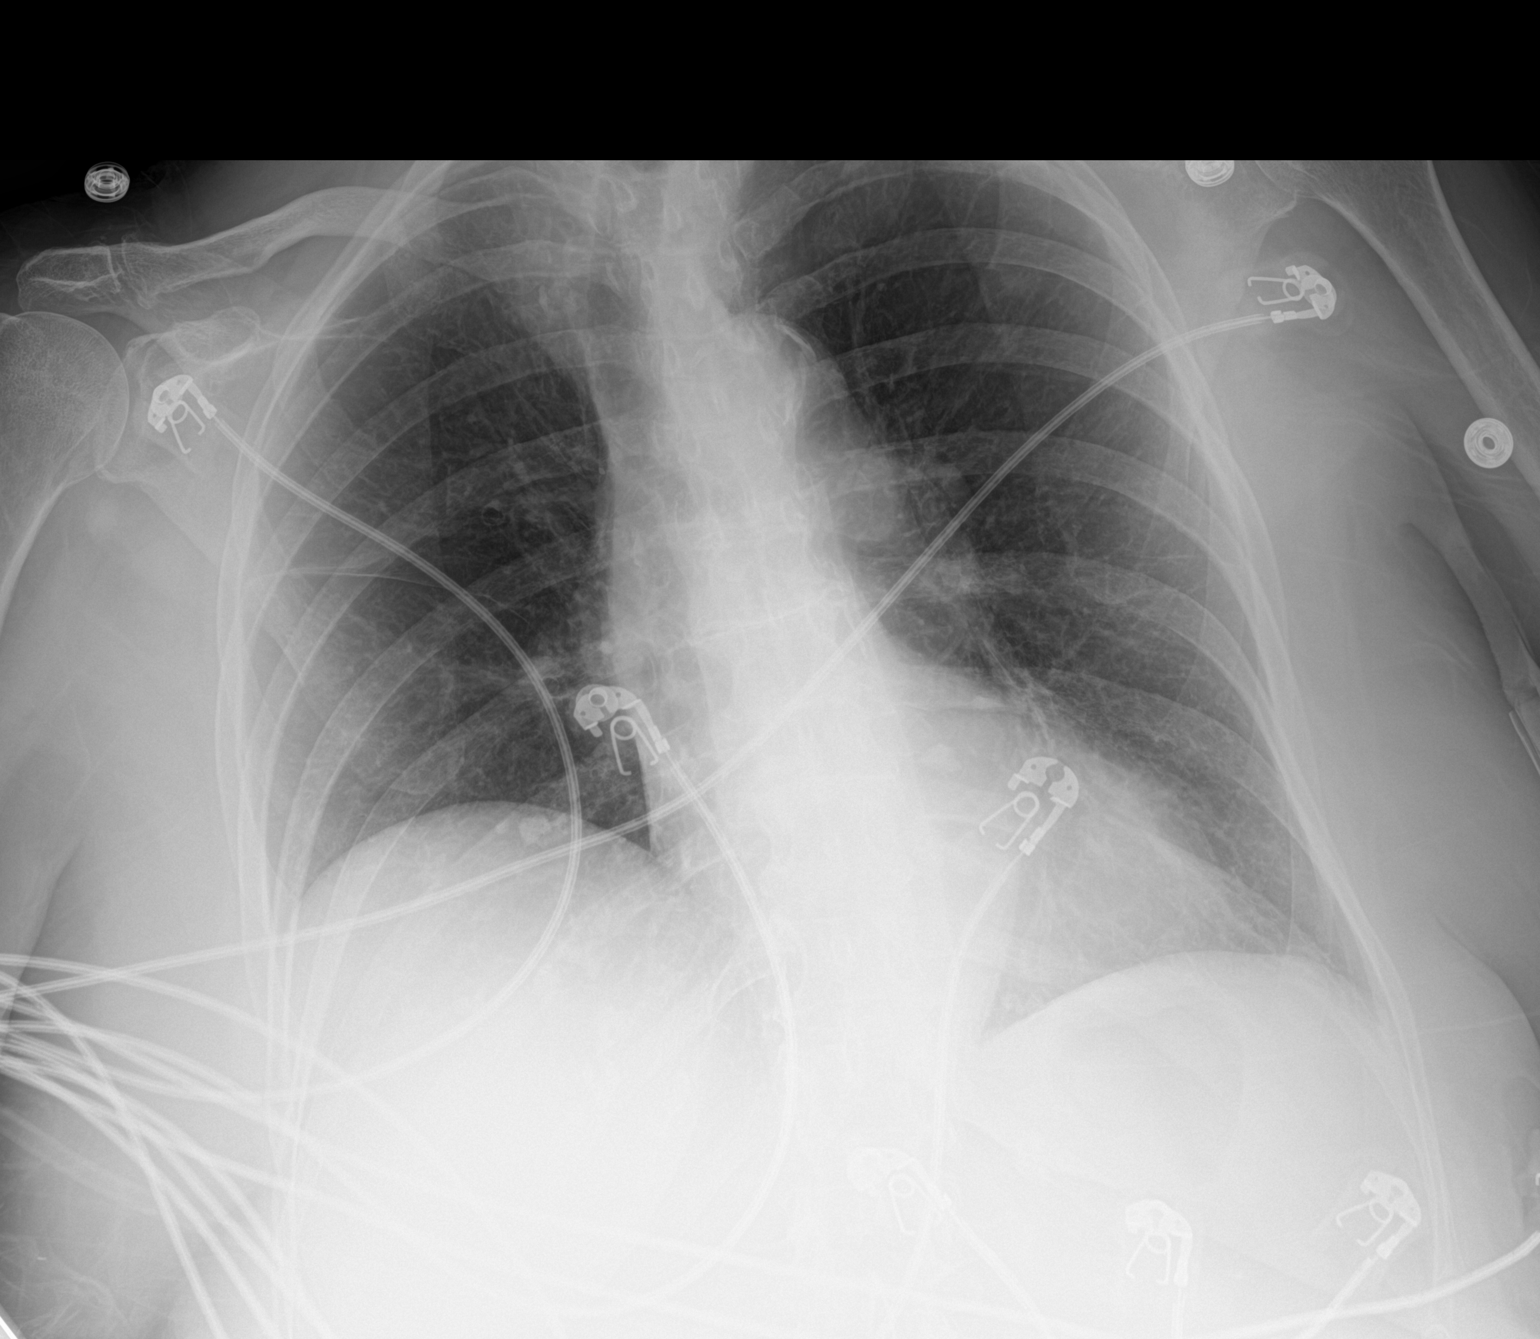

[1 of 1 positions shown; findings below may reference images not displayed]

FINDINGS: Low volume chest with asymmetric elevation of the right diaphragm,
chronic. Normal heart size and mediastinal contours for technique.
There is no edema, consolidation, effusion, or pneumothorax.
IMPRESSION: No evidence of acute disease.

## 2018-11-12 IMAGING — CT CT ANGIO CHEST-ABD-PELV FOR DISSECTION W/ AND WO/W CM
2 of 7 series · 14 of 46 positions shown, 16 images · IV contrast (omnipaque)
Comparison: Abdomen and pelvis CT [DATE].  Chest CTA [DATE]

CLINICAL DATA: Chest and back pain with aortic dissection suspected

EXAM:
CT ANGIOGRAPHY CHEST, ABDOMEN AND PELVIS
TECHNIQUE: Multidetector CT imaging through the chest, abdomen and pelvis was
performed using the standard protocol during bolus administration of
intravenous contrast. Multiplanar reconstructed images and MIPs were
obtained and reviewed to evaluate the vascular anatomy.
CONTRAST:  80mL OMNIPAQUE IOHEXOL 350 MG/ML SOLN

[Series 6: arterial · axial · arterial · 0.80mm/px · z∈[+744,+1310]mm · 11 of 317 slices shown, 13 images]
[im 17/317  soft-tissue]
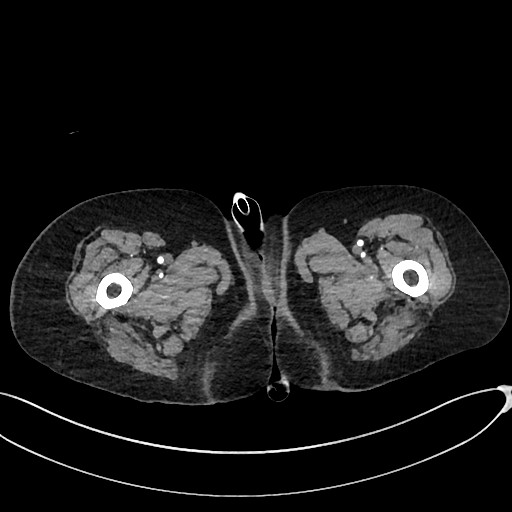
[im 17/317  bone]
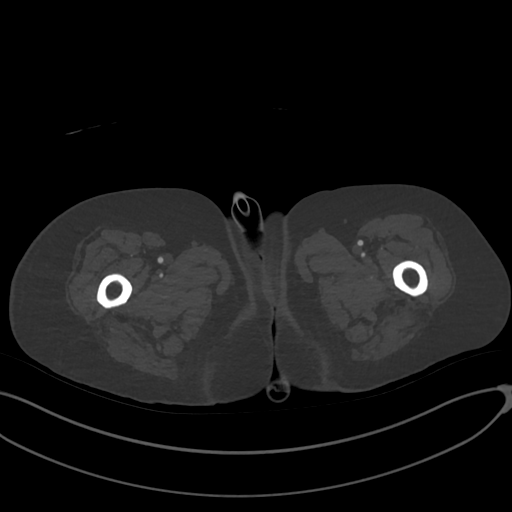
[im 50/317  soft-tissue]
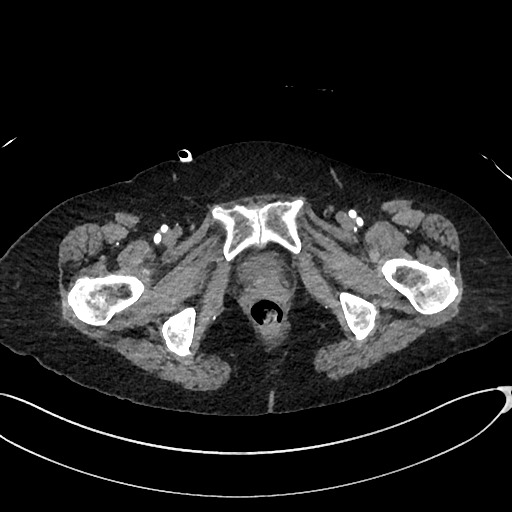
[im 84/317  soft-tissue]
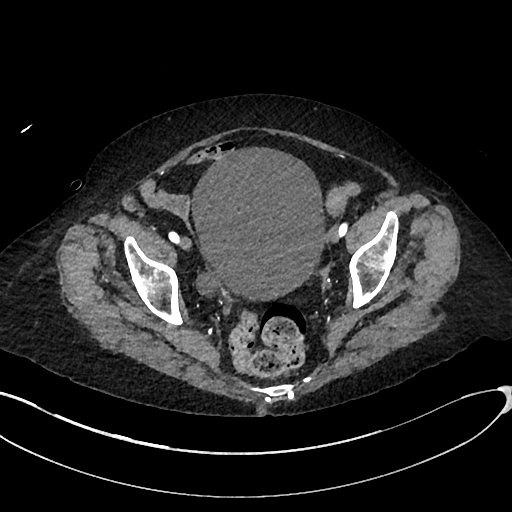
[im 100/317  soft-tissue]
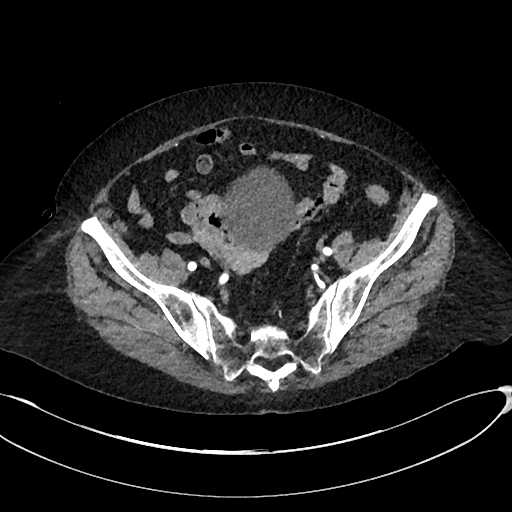
[im 134/317  soft-tissue]
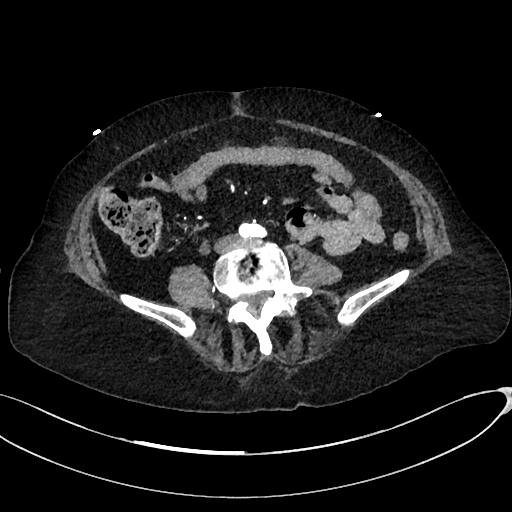
[im 167/317  soft-tissue]
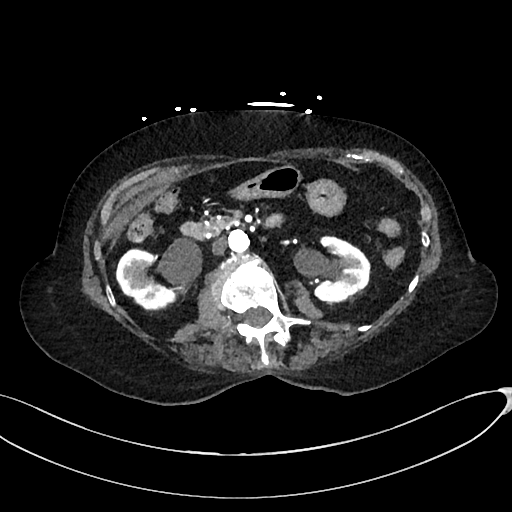
[im 183/317  soft-tissue]
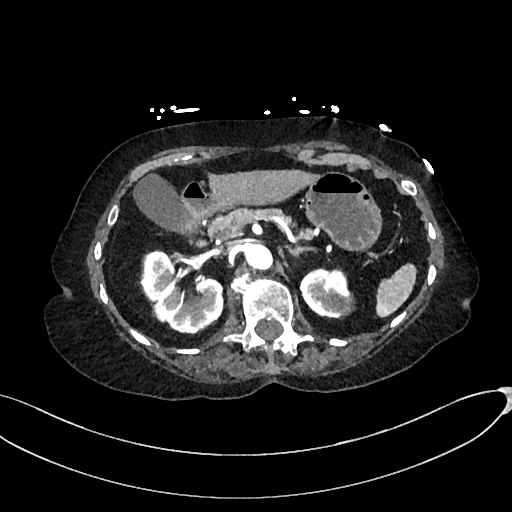
[im 217/317  soft-tissue]
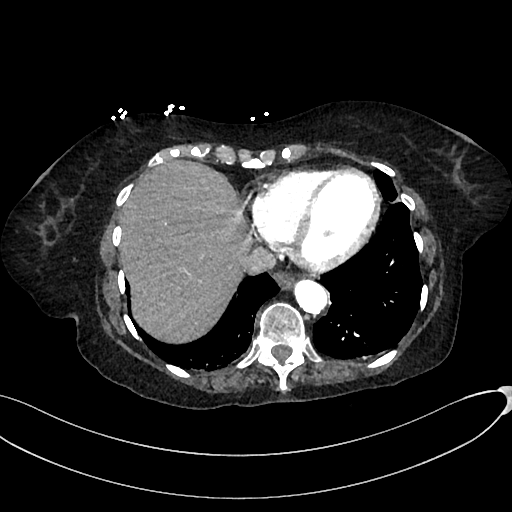
[im 233/317  soft-tissue]
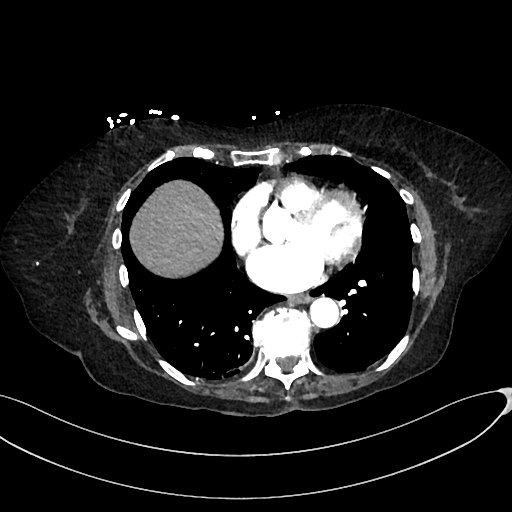
[im 233/317  bone]
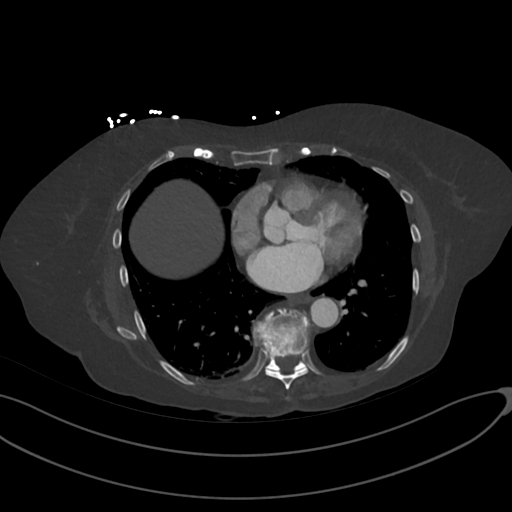
[im 267/317  soft-tissue]
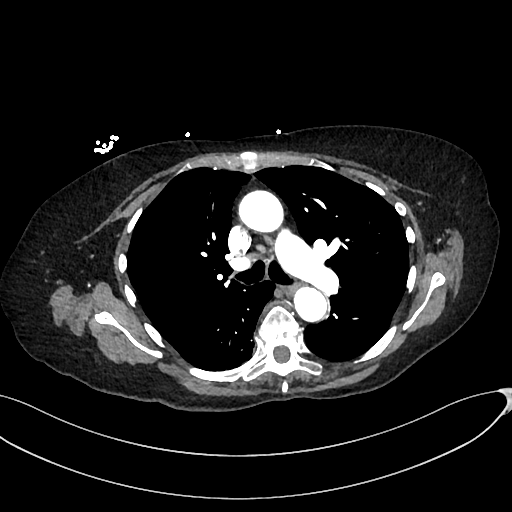
[im 300/317  soft-tissue]
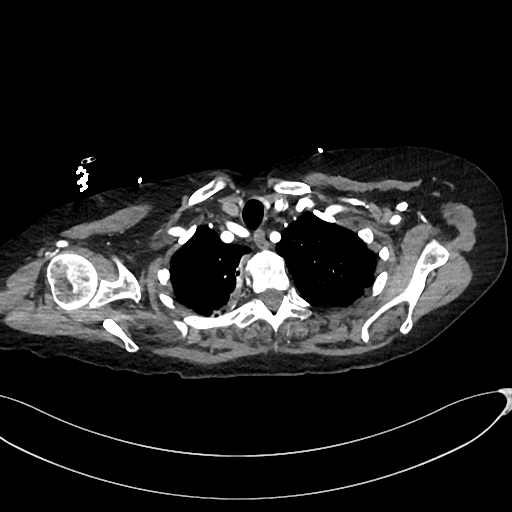

[Series 9: cor · coronal · 0.83mm/px · 3 of 148 slices shown]
[im 37/148  soft-tissue]
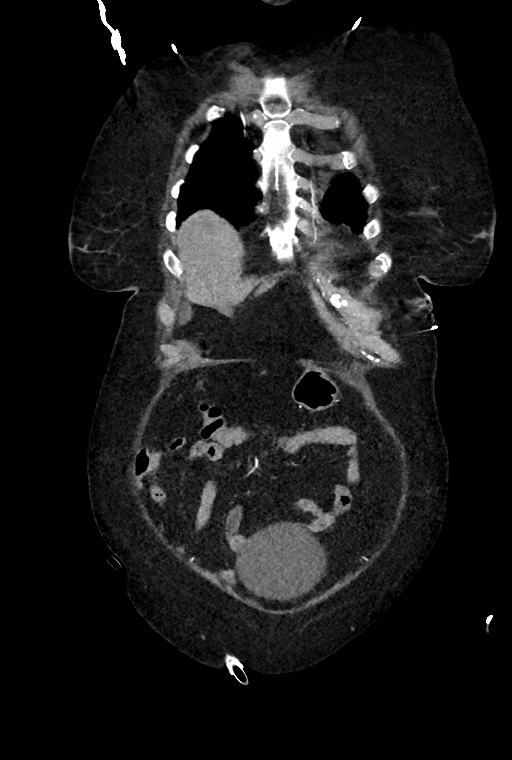
[im 74/148  soft-tissue]
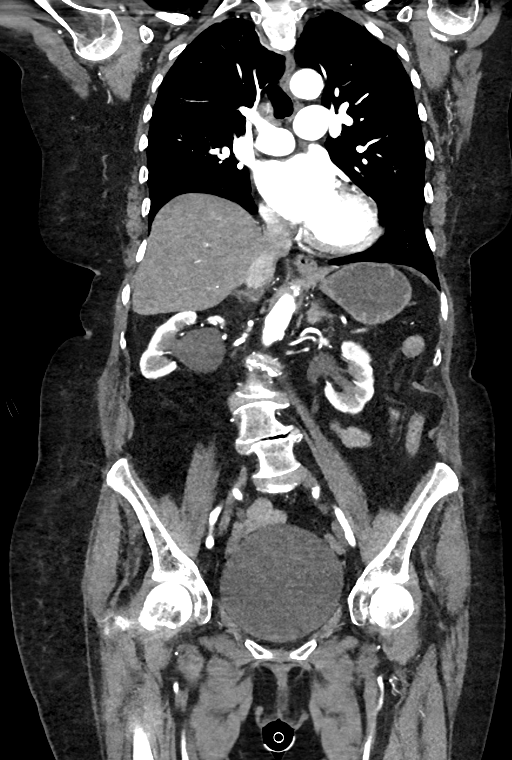
[im 111/148  soft-tissue]
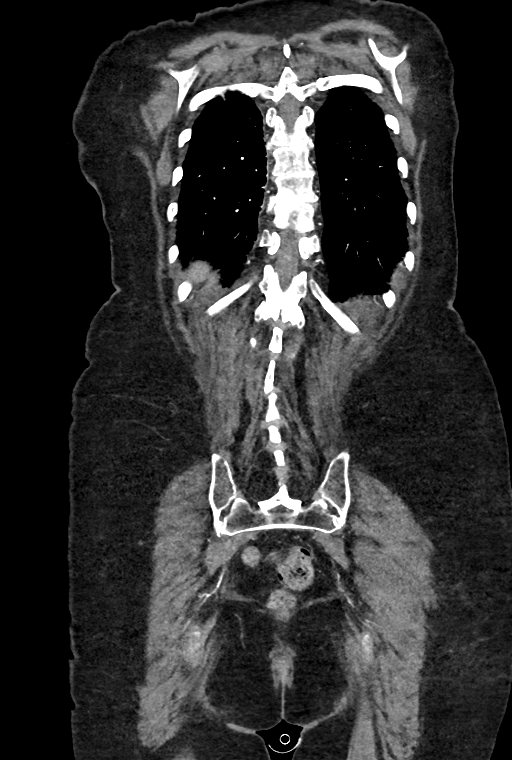

[14 of 46 positions shown; findings below may reference images not displayed]

FINDINGS: CTA CHEST FINDINGS

Cardiovascular: Noncontrast phase shows no intramural hematoma no
aortic dissection or aneurysm. No visible pulmonary artery filling
defect. Scattered atherosclerotic calcification of the aorta. Right
coronary calcified plaque is noted. Mild atheromatous narrowing at
the proximal left subclavian artery.

Mediastinum/Nodes: No adenopathy or mass.

Lungs/Pleura: Mild dependent atelectasis and right upper lobe
scarring. There is no edema, consolidation, effusion, or
pneumothorax.

Musculoskeletal: Diffuse degenerative disc narrowing with endplate
ridging. There is exaggerated thoracic kyphosis and mild scoliosis.
Notable T7-8 and T8-9 central disc protrusion.

Review of the MIP images confirms the above findings.

CTA ABDOMEN AND PELVIS FINDINGS

VASCULAR

Aorta: Multifocal atherosclerotic plaque. No aneurysm or dissection.

Celiac: High-grade narrowing at the celiac origin from
atherosclerosis and likely from median arcuate ligament.

SMA: Widely patent SMA. No branch occlusion or beading. Prominent
pancreatic arcade compensating for the proximal celiac narrowing.

Renals: Atherosclerotic plaque at both ostia without flow limiting
stenosis. Early branching left renal artery.

IMA: Patent

Inflow: Atherosclerotic plaque without dissection or aneurysm.

Veins: Negative in the arterial phase

Review of the MIP images confirms the above findings.

NON-VASCULAR

Hepatobiliary: No focal liver abnormality.No evidence of biliary
obstruction or stone.

Pancreas: Unremarkable.

Spleen: Unremarkable.

Adrenals/Urinary Tract: Negative adrenals. Bilateral
hydroureteronephrosis to the level of distended bladder.

Stomach/Bowel:  No obstruction.  No evidence of inflammation.

Lymphatic: No acute vascular abnormality.  No mass or adenopathy.

Reproductive:Hysterectomy.

Other: No ascites or pneumoperitoneum.

Musculoskeletal: No acute abnormalities. Severe diffuse lumbar spine
degeneration with dextroscoliosis. No acute osseous finding

Review of the MIP images confirms the above findings.
IMPRESSION: 1. No acute vascular finding including evidence of acute aortic
syndrome.
2. Atherosclerosis with proximal celiac narrowing but widely patent
SMA and peripancreatic arcade.
3. Distended bladder resulting in bilateral hydroureteronephrosis.

## 2018-11-12 MED ORDER — IOHEXOL 350 MG/ML SOLN
80.0000 mL | Freq: Once | INTRAVENOUS | Status: AC | PRN
  Administered 2018-11-12: 06:00:00 80 mL via INTRAVENOUS

## 2018-11-12 MED ORDER — LORAZEPAM 0.5 MG PO TABS
0.5000 mg | ORAL_TABLET | Freq: Every evening | ORAL | Status: DC | PRN
  Administered 2018-11-15 – 2018-11-23 (×5): 0.5 mg via ORAL
  Filled 2018-11-12 (×5): qty 1

## 2018-11-12 MED ORDER — ACETAMINOPHEN 650 MG RE SUPP: 650.0000 mg | Freq: Four times a day (QID) | RECTAL | Status: DC | PRN

## 2018-11-12 MED ORDER — ENOXAPARIN SODIUM 40 MG/0.4ML ~~LOC~~ SOLN
40.0000 mg | SUBCUTANEOUS | Status: DC
  Administered 2018-11-13 – 2018-11-19 (×7): 40 mg via SUBCUTANEOUS
  Filled 2018-11-12 (×8): qty 0.4

## 2018-11-12 MED ORDER — SODIUM CHLORIDE 0.9 % IV BOLUS: 500.0000 mL | Freq: Once | INTRAVENOUS | Status: DC

## 2018-11-12 MED ORDER — CHLORHEXIDINE GLUCONATE CLOTH 2 % EX PADS
6.0000 | MEDICATED_PAD | Freq: Every day | CUTANEOUS | Status: DC
  Administered 2018-11-12 – 2018-11-13 (×2): 6 via TOPICAL

## 2018-11-12 MED ORDER — ALBUTEROL SULFATE (2.5 MG/3ML) 0.083% IN NEBU: 2.5000 mg | INHALATION_SOLUTION | Freq: Four times a day (QID) | RESPIRATORY_TRACT | Status: DC | PRN

## 2018-11-12 MED ORDER — DICLOFENAC SODIUM 1 % TD GEL
4.0000 g | Freq: Four times a day (QID) | TRANSDERMAL | Status: DC | PRN
  Filled 2018-11-12: qty 100

## 2018-11-12 MED ORDER — SODIUM CHLORIDE 0.9 % IV BOLUS
500.0000 mL | Freq: Once | INTRAVENOUS | Status: AC
  Administered 2018-11-12: 500 mL via INTRAVENOUS

## 2018-11-12 MED ORDER — LIDOCAINE 5 % EX PTCH: 1.0000 | MEDICATED_PATCH | CUTANEOUS | Status: DC

## 2018-11-12 MED ORDER — HYDROCODONE-ACETAMINOPHEN 5-325 MG PO TABS
1.0000 | ORAL_TABLET | Freq: Every day | ORAL | Status: DC | PRN
  Administered 2018-11-17 – 2018-11-18 (×3): 1 via ORAL
  Filled 2018-11-12 (×4): qty 1

## 2018-11-12 MED ORDER — SODIUM CHLORIDE 0.9% FLUSH
3.0000 mL | Freq: Two times a day (BID) | INTRAVENOUS | Status: DC
  Administered 2018-11-12 – 2018-11-28 (×26): 3 mL via INTRAVENOUS

## 2018-11-12 MED ORDER — LEVOTHYROXINE SODIUM 50 MCG PO TABS
50.0000 ug | ORAL_TABLET | Freq: Every day | ORAL | Status: DC
  Administered 2018-11-13 – 2018-11-28 (×16): 50 ug via ORAL
  Filled 2018-11-12 (×16): qty 1

## 2018-11-12 MED ORDER — CARVEDILOL 6.25 MG PO TABS
6.2500 mg | ORAL_TABLET | Freq: Two times a day (BID) | ORAL | Status: DC
  Administered 2018-11-12 – 2018-11-13 (×4): 6.25 mg via ORAL
  Filled 2018-11-12 (×4): qty 1

## 2018-11-12 MED ORDER — ACETAMINOPHEN 325 MG PO TABS
650.0000 mg | ORAL_TABLET | Freq: Four times a day (QID) | ORAL | Status: DC | PRN
  Administered 2018-11-12 – 2018-11-24 (×15): 650 mg via ORAL
  Filled 2018-11-12 (×15): qty 2

## 2018-11-12 MED ORDER — SODIUM CHLORIDE 0.9 % IV BOLUS
1000.0000 mL | Freq: Once | INTRAVENOUS | Status: AC
  Administered 2018-11-12: 06:00:00 1000 mL via INTRAVENOUS

## 2018-11-12 MED ORDER — ASPIRIN 325 MG PO TABS
325.0000 mg | ORAL_TABLET | Freq: Every day | ORAL | Status: DC
  Administered 2018-11-13 – 2018-11-19 (×7): 325 mg via ORAL
  Filled 2018-11-12 (×7): qty 1

## 2018-11-12 MED ORDER — VITAMIN B-12 1000 MCG PO TABS
1000.0000 ug | ORAL_TABLET | Freq: Every day | ORAL | Status: DC
  Administered 2018-11-12 – 2018-11-28 (×17): 1000 ug via ORAL
  Filled 2018-11-12 (×17): qty 1

## 2018-11-12 MED ORDER — MELATONIN 3 MG PO TABS: ORAL_TABLET | Freq: Every day | ORAL | Status: DC

## 2018-11-12 MED ORDER — LOSARTAN POTASSIUM 50 MG PO TABS
50.0000 mg | ORAL_TABLET | Freq: Every day | ORAL | Status: DC
  Administered 2018-11-12 – 2018-11-13 (×2): 50 mg via ORAL
  Filled 2018-11-12 (×2): qty 1

## 2018-11-12 MED ORDER — SODIUM CHLORIDE 0.9 % IV SOLN
INTRAVENOUS | Status: DC
  Administered 2018-11-12: 09:00:00 via INTRAVENOUS

## 2018-11-12 NOTE — ED Provider Notes (Signed)
Care assumed from Krista Blue, PA-C at shift change pending consult with hospitalist.   8:58 AM CONSULT with Dr. Tamala Julian who accepts pt for admission.    Rodney Booze, PA-C 11/12/18 [id number]    Davonna Belling, MD 11/12/18 1041

## 2018-11-12 NOTE — ED Notes (Signed)
Patient transported to CT 

## 2018-11-12 NOTE — ED Triage Notes (Signed)
Pt from the [facility]. Ems reports they were called out to help off the toilet, pt started complaing of neck and back pain, pt started vomiting(3x), pt claims she has heart history but cant explain what. 324 asprin given, 1 nitro given. 133/60, 96pulse, 22rr, 95%o2

## 2018-11-12 NOTE — ED Notes (Signed)
Lunch Tray Ordered @ 1104.  

## 2018-11-12 NOTE — Consult Note (Addendum)
Cardiology Consultation:   Patient ID: Autumn KillianKaren M Feely MRN: [id number]; DOB: 08/01/1932  Admit date: 11/12/2018 Date of Consult: 11/12/2018  Primary Care Provider: Merlene LaughterStoneking, Hal, MD Primary Cardiologist: Rollene RotundaJames Hochrein, MD  Primary Electrophysiologist:  None    Patient Profile:   Autumn KillianKaren M [name] is a 85 y.o. female with a hx of nonischemic cardiomyopathy EF 55-60% 2016, mild MR, hypertension, hypothyroidism, LBBB  who is being seen today for the evaluation of elevated troponin in the setting of acute vomiting at the request of Dr. Katrinka BlazingSmith.  History of Present Illness:   Autumn [name] first saw Dr. Antoine PocheHochrein back in 2015 for decreased EF to 35% and moderate MR. Denied anginal symptoms. 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 She also reported h/o of LBBB. Patient underwent cath which showed minimal coronary plaque and mild global LV dysfunction. EF at cath was 45%Medical management was continued at that time. At follow up amlodipine was stopped for lower leg edema. Lisinopril and coreg were started for cardiomyopathy. In 2016 echo was repeated and showed EF 55-60% and Grade 1 DD, wall motion was normal, PA peak pressure 39 mm HG, mild MR. Patient had a carotid doppler for bruit that showed no disease. After that patient was lost to follow up.   Patient was brought to the ED 9/29 from an independent living center for vomiting and nausea. Yesterday patient began feeling lightheaded and dizzy while brushing her teeth. She felt she might pass out and so she sat down on the toilet. she had acute nausea and began to vomit. 3 episodes of vomiting. She felt overall weakness. She did have minimal shortness of breath. Her neck and back hurt from the vomiting. She denies fever, chills, headache, bowel or bladder changes. Denied chest pain.  In the ED  she was afebrile. BP 139/76, RR 24, pulse 92. O2 saturation was good on RA. Orthostatics were noted to be positive after standing for 3 minutes. Labs showed sodium 131, CO2 18, creatinine 1.01, BUN 21, anion gap 16. Lactic acid 4.6 > 3.8 >4.3. Hs troponin 42 > 76 > 196. CXR showed no acute abnormality. EKG showed NSR with known LBBB and nonspecific ST/T wave changes. She was given ASA 325 mg and NTG x 1 in the ED. She was started on IVF. Patient is currently chest pain free. She has not had further N/V episodes.   Heart Pathway Score:     Past Medical History:  Diagnosis Date  . Anxiety   . GERD (gastroesophageal reflux disease)   . Hypertension   . Hypothyroid   . LBBB (left bundle branch block)   . Osteopenia   . Peripheral neuropathy   . Venous insufficiency     Past Surgical History:  Procedure Laterality Date  . ABDOMINAL HYSTERECTOMY    . BLADDER SURGERY    . LEFT HEART CATHETERIZATION WITH CORONARY ANGIOGRAM N/A 09/02/2013   Procedure: LEFT HEART CATHETERIZATION WITH CORONARY ANGIOGRAM;  Surgeon: Rollene RotundaJames Hochrein, MD;  [facility];  Service: Cardiovascular;  Laterality: N/A;  . RENAL ANGIOGRAM N/A 09/02/2013   Procedure: RENAL ANGIOGRAM;  Surgeon: Rollene RotundaJames Hochrein, MD;  [facility];  Service: Cardiovascular;  Laterality: N/A;     Home Medications:  Prior to Admission medications   Medication Sig Start Date End Date Taking? Authorizing Provider  Ascorbic Acid (VITAMIN C PO)  Take 1 tablet by mouth daily.   Yes [provider]  carvedilol (COREG) 6.25 MG tablet Take 1 tablet (6.25 mg total) by mouth 2 (two) times daily. 06/23/14  Yes [name], MD  Cholecalciferol (VITAMIN D PO) Take 1 tablet by mouth daily.   Yes [provider]  Cyanocobalamin (VITAMIN B-12 PO) Take 1 tablet by mouth daily.   Yes [provider]  diclofenac sodium (VOLTAREN) 1 % GEL Apply 4 g topically 4 (four) times daily. Patient taking differently: Apply 4 g  topically 4 (four) times daily as needed (pain).  10/02/18  Yes Plunkett, Alphonzo Lemmings, MD  estradiol (ESTRACE) 1 MG tablet Take 1 mg by mouth daily.   Yes [provider]  HYDROcodone-acetaminophen (NORCO/VICODIN) 5-325 MG per tablet Take 1 tablet by mouth daily as needed for moderate pain.   Yes [provider]  levothyroxine (SYNTHROID, LEVOTHROID) 50 MCG tablet Take 50 mcg by mouth daily before breakfast.   Yes [provider]  LORazepam (ATIVAN) 0.5 MG tablet Take 0.5 mg by mouth at bedtime as needed for sleep.    Yes [provider]  losartan (COZAAR) 50 MG tablet Take 50 mg by mouth daily.   Yes [provider]  MELATONIN PO Take 1 mL by mouth at bedtime.   Yes [provider]  Multiple Vitamins-Minerals (HAIR SKIN AND NAILS FORMULA PO) Take 1 tablet by mouth daily.   Yes [provider]  omeprazole (PRILOSEC) 20 MG capsule Take 20 mg by mouth daily as needed (for reflux).   Yes [provider]  TURMERIC CURCUMIN PO Take 1 tablet by mouth daily.    Yes [provider]    Inpatient Medications: Scheduled Meds: . aspirin  325 mg Oral Daily  . carvedilol  6.25 mg Oral BID  . enoxaparin (LOVENOX) injection  40 mg Subcutaneous Q24H  . [START ON 11/13/2018] levothyroxine  50 mcg Oral QAC breakfast  . losartan  50 mg Oral Daily  . Melatonin   Oral QHS  . sodium chloride flush  3 mL Intravenous Q12H  . vitamin B-12  1,000 mcg Oral Daily   Continuous Infusions: . sodium chloride Stopped (11/12/18 1750)  . sodium chloride     PRN Meds: acetaminophen **OR** acetaminophen, albuterol, diclofenac sodium, HYDROcodone-acetaminophen, LORazepam  Allergies:    Allergies  Allergen Reactions  . Hydrochlorothiazide Other (See Comments)    REACTION: hyponatremia  . Ramipril Other (See Comments)    REACTION: GI upset  . Remeron [Mirtazapine] Other (See Comments)    REACTION: weakness  . Sulfa Antibiotics Rash    Social  History:   Social History   Socioeconomic History  . Marital status: Widowed    Spouse name: Not on file  . Number of children: 2  . Years of education: Not on file  . Highest education level: Not on file  Occupational History  . Not on file  Social Needs  . Financial resource strain: Not on file  . Food insecurity    Worry: Not on file    Inability: Not on file  . Transportation needs    Medical: Not on file    Non-medical: Not on file  Tobacco Use  . Smoking status: Never Smoker  . Smokeless tobacco: Never Used  Substance and Sexual Activity  . Alcohol use: Yes    Frequency: Never    Comment: Once every three months   . Drug use: No  . Sexual activity: Not on file  Lifestyle  .  Physical activity    Days per week: Not on file    Minutes per session: Not on file  . Stress: Not on file  Relationships  . Social Musician on phone: Not on file    Gets together: Not on file    Attends religious service: Not on file    Active member of club or organization: Not on file    Attends meetings of clubs or organizations: Not on file    Relationship status: Not on file  . Intimate partner violence    Fear of current or ex partner: Not on file    Emotionally abused: Not on file    Physically abused: Not on file    Forced sexual activity: Not on file  Other Topics Concern  . Not on file  Social History Narrative   Lives alone.      Family History:   Family History  Problem Relation Age of Onset  . Hypertension Mother   . Prostate cancer Father      ROS:  Please see the history of present illness.  All other ROS reviewed and negative.     Physical Exam/Data:   Vitals:   11/12/18 1400 11/12/18 1500 11/12/18 1600 11/12/18 1700  BP: (!) 150/83 (!) 145/75 133/66 (!) 152/78  Pulse: 93  85 82  Resp: (!) 23 13 (!) 22 (!) 21  Temp:      TempSrc:      SpO2: 100%  100% 97%    Intake/Output Summary (Last 24 hours) at 11/12/2018 1821 Last data filed at  11/12/2018 1415 Gross per 24 hour  Intake 3546.95 ml  Output 700 ml  Net 2846.95 ml   Last 3 Weights 10/02/2018 01/30/2017 07/10/2014  Weight (lbs) 150 lb 154 lb 149 lb  Weight (kg) 68.04 kg 69.854 kg 67.586 kg     There is no height or weight on file to calculate BMI.  General:  Frail WF in no acute distress HEENT: normal Lymph: no adenopathy Neck: no JVD Endocrine:  No thryomegaly Vascular: No carotid bruits; FA pulses 2+ bilaterally without bruits  Cardiac:  normal S1, S2; RRR; no murmur  Lungs:  clear to auscultation bilaterally, no wheezing, rhonchi or rales  Abd: soft, nontender, no hepatomegaly  Ext: no edema Musculoskeletal:  No deformities, BUE and BLE strength normal and equal Skin: warm and dry  Neuro:  CNs 2-12 intact, no focal abnormalities noted Psych:  Normal affect   EKG:  The EKG was personally reviewed and demonstrates:  NSR with known LBBB, nonspecific ST/T wave changes Telemetry:  Telemetry was personally reviewed and demonstrates:  NSR, HR in the 80s; no other arrhythmias noted  Relevant CV Studies:  Echo pending  Echo 06/24/2014  Study Conclusions  - Left ventricle: The cavity size was normal. Systolic function was   normal. The estimated ejection fraction was in the range of 55%   to 60%. Wall motion was normal; there were no regional wall   motion abnormalities. There was an increased relative   contribution of atrial contraction to ventricular filling.   Doppler parameters are consistent with abnormal left ventricular   relaxation (grade 1 diastolic dysfunction). - Mitral valve: There was mild regurgitation. - Pulmonary arteries: PA peak pressure: 39 mm Hg (S).  Cardiac Cath 09/02/2013 Left anterior descending (LAD):   Anomalous origin off the RCA.  Normal otherwise.   Left circumflex (LCx):  Essentially a very large branching RI normal throughout its course Right  coronary artery (RCA):  Very large.  Mild proximal luminal irregularities.  PDA  large and normal.  PL moderate sized and normal.  LAD arises from the proximal RCA. Left ventriculography: Left ventricular systolic function is mildly globally reduced, LVEF is estimated at 45, there is no significant mitral regurgitation  Left renal artery:  Two left renal arteries both widely patent. Right renal artery:  Mild fibromuscular dysplasia.  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  Mild global LV dysfunction.   Recommendations:   Continue medical management.   Laboratory Data:  High Sensitivity Troponin:   Recent Labs  Lab 11/12/18 0210 11/12/18 0425 11/12/18 1230  TROPONINIHS 42* 76* 196*     Chemistry Recent Labs  Lab 11/12/18 0210  NA 131*  K 4.5  CL 97*  CO2 18*  GLUCOSE 156*  BUN 21  CREATININE 1.01*  CALCIUM 9.4  GFRNONAA 51*  GFRAA 59*  ANIONGAP 16*    Recent Labs  Lab 11/12/18 0210  PROT 7.1  ALBUMIN 3.9  AST 26  ALT 16  ALKPHOS 42  BILITOT 0.6   Hematology Recent Labs  Lab 11/12/18 0210  WBC 9.6  RBC 3.58*  HGB 12.3  HCT 37.0  MCV 103.4*  MCH 34.4*  MCHC 33.2  RDW 12.5  PLT 262   BNP Recent Labs  Lab 11/12/18 0210  BNP 65.8    DDimer No results for input(s): DDIMER in the last 168 hours.   Radiology/Studies:  Dg Chest Port 1 View  Result Date: 11/12/2018 CLINICAL DATA:  Tachypnea EXAM: PORTABLE CHEST 1 VIEW COMPARISON:  01/30/2017 FINDINGS: Low volume chest with asymmetric elevation of the right diaphragm, chronic. Normal heart size and mediastinal contours for technique. There is no edema, consolidation, effusion, or pneumothorax. IMPRESSION: No evidence of acute disease. Electronically Signed   By: Marnee Spring M.D.   On: 11/12/2018 05:51   Ct Angio Chest/abd/pel For Dissection W And/or Wo Contrast  Result Date: 11/12/2018 CLINICAL DATA:  Chest and back pain with aortic dissection suspected EXAM: CT ANGIOGRAPHY CHEST, ABDOMEN AND PELVIS TECHNIQUE: Multidetector CT imaging  through the chest, abdomen and pelvis was performed using the standard protocol during bolus administration of intravenous contrast. Multiplanar reconstructed images and MIPs were obtained and reviewed to evaluate the vascular anatomy. CONTRAST:  91mL OMNIPAQUE IOHEXOL 350 MG/ML SOLN COMPARISON:  Abdomen and pelvis CT 09/09/2008.  Chest CTA 01/31/2017 FINDINGS: CTA CHEST FINDINGS Cardiovascular: Noncontrast phase shows no intramural hematoma no aortic dissection or aneurysm. No visible pulmonary artery filling defect. Scattered atherosclerotic calcification of the aorta. Right coronary calcified plaque is noted. Mild atheromatous narrowing at the proximal left subclavian artery. Mediastinum/Nodes: No adenopathy or mass. Lungs/Pleura: Mild dependent atelectasis and right upper lobe scarring. There is no edema, consolidation, effusion, or pneumothorax. Musculoskeletal: Diffuse degenerative disc narrowing with endplate ridging. There is exaggerated thoracic kyphosis and mild scoliosis. Notable T7-8 and T8-9 central disc protrusion. Review of the MIP images confirms the above findings. CTA ABDOMEN AND PELVIS FINDINGS VASCULAR Aorta: Multifocal atherosclerotic plaque. No aneurysm or dissection. Celiac: High-grade narrowing at the celiac origin from atherosclerosis and likely from median arcuate ligament. SMA: Widely patent SMA. No branch occlusion or beading. Prominent pancreatic arcade compensating for the proximal celiac narrowing. Renals: Atherosclerotic plaque at both ostia without flow limiting stenosis. Early branching left renal artery. IMA: Patent Inflow: Atherosclerotic plaque without dissection or aneurysm. Veins: Negative in the arterial phase Review of the MIP images confirms the above findings. NON-VASCULAR  Hepatobiliary: No focal liver abnormality.No evidence of biliary obstruction or stone. Pancreas: Unremarkable. Spleen: Unremarkable. Adrenals/Urinary Tract: Negative adrenals. Bilateral  hydroureteronephrosis to the level of distended bladder. Stomach/Bowel:  No obstruction.  No evidence of inflammation. Lymphatic: No acute vascular abnormality.  No mass or adenopathy. Reproductive:Hysterectomy. Other: No ascites or pneumoperitoneum. Musculoskeletal: No acute abnormalities. Severe diffuse lumbar spine degeneration with dextroscoliosis. No acute osseous finding Review of the MIP images confirms the above findings. IMPRESSION: 1. No acute vascular finding including evidence of acute aortic syndrome. 2. Atherosclerosis with proximal celiac narrowing but widely patent SMA and peripancreatic arcade. 3. Distended bladder resulting in bilateral hydroureteronephrosis. Electronically Signed   By: Monte Fantasia M.D.   On: 11/12/2018 06:50    Assessment and Plan:   Vomiting/Nausea Patient was brought from [facility] for acute N/V this morning. Nausea resolved on arrival to ED. Patient afebrile and hypertensive. - Lactic acidosis 4.6 - EKG was unchanged from prior with LBBB and nonspecific ST/T wave changes - Hs troponin elevated to 196 - CTA was negative for PE or dissection - Patient has not had further N/V since admission, but still feels weak - on IVF - further work up per IM  Elevated Troponin - 42 > 76 > 196 - Likely demand ischemia in the setting of acute N/V and Lactic acidosis - Patient had cath in 2016 showing nonobstructive cardiomyopathy. - Patient with no anginal symptoms - Continue to trend troponin. If troponin continues to trend up or patient has chest pain can consider further ischemic work-up.  - echo ordered  Nonischemic cardiomyopathy Patient had cath in 2016 showing nonobstructive cardiomyopathy, EF was 35% at that time. Repeat Echo in 2016 showed 55-60%, no wall motion abnormalities, grade 1 DD, mild MR - Patient on coreg and lisinopril at home - Patient denies acute symptoms - Euvolemic on exam - echo results pending. If echo is normal do  not suspect further ischemic work-up at this time.  - continue coreg 6.25 mg and losartan 50 mg daily  Neck/back pain - Likely from acute vomiting - analgesics per IM  HTN - Coreg 6.25 mg daily, losartan 50 mg daily home meds - Pressures elevated, 152/78. Rates in the 80s - Can consider titration of medication.   LBBB - Known LBBB - EKG with nonspecific ST/T changes  Renal insufficiency - 1.01 on admission - baseline appears to be 0.8 - Possibly due to dehydration from vomiting   For questions or updates, please contact [place] Please consult [email or web address] for contact info under    Signed, Cadence Arlyss Repress  11/12/2018 6:21 PM    Attending note:  Patient seen and examined.  I reviewed the available records and discussed the case with Autumn [name].  Autumn [name] is currently admitted to the hospital from an independent living center with nausea and emesis associated with lightheadedness that occurred while she was brushing her teeth.  She felt presyncopal but did not have frank syncope nor did she experience any chest discomfort.  In the course of her work-up she was found to be orthostatic, also with abnormal lactic acid levels, chronic left bundle branch block by ECG, and minor elevation in high-sensitivity troponin I to 196 at peak so far.  She has a history of normal coronary arteries with anomalous origin of the LAD based on prior work-up in 2015 with evidence of nonischemic cardiomyopathy at that time, and subsequent normalization of LVEF on medical therapy.  Her last assessment of ejection  fraction was in 2016.  She reports no chest pain on examination, systolic blood pressure ranging 130s to 140s with heart rate in the 80s in sinus rhythm by telemetry which I personally reviewed.  Lungs are clear without labored breathing and cardiac exam reveals RRR without gallop.  Lab work shows potassium 4.5, BUN 21, creatinine 1.01, hemoglobin 12.3, platelets 262.  Chest  CTA showed no obvious acute vascular findings.  At this point suspect demand ischemia as etiology for minor increase in high-sensitivity troponin I levels, particularly in the absence of chest pain and with chronic left bundle branch block by ECG.  Agree with follow-up echocardiogram to reassess LVEF given known history of nonischemic cardiomyopathy, although LVEF had normalized as of 2016 on medical therapy.  Jonelle SidleSamuel G. Eduard Penkala, M.D., F.A.C.C.

## 2018-11-12 NOTE — ED Notes (Signed)
Lunch delivered to patient 

## 2018-11-12 NOTE — ED Notes (Signed)
This tech and Abram Sander, RN put foley catheter in. Pt was cleaned before catheter was put it in. Urine output was 700 ml after foley was placed. CN was notified about foley catheter.

## 2018-11-12 NOTE — H&P (Addendum)
History and Physical    Autumn [name] WLN:[id number] DOB: Mar 22, 1932 DOA: 11/12/2018  Referring MD/NP/PA: Maree Krabbe, PA-C PCP: Merlene Laughter, MD  Patient coming from:[facility] via EMS  Chief Complaint: Nausea and vomiting  I have personally briefly reviewed patient's old medical records in [facility]   HPI: Autumn [name] is a 85 y.o. female with medical history significant of hypertension, hypothyroidism,  LBBB, arthritis, and GERD; who presents with complaints of nausea and vomiting.  Yesterday, she began feeling lightheaded as though she may pass out and was unable to walk while brushing her teeth.  She is able to sit down on the toilet in the bathroom and became nauseated and vomited.  Associated symptoms include complaints of generalized malaise, shortness of breath, achy neck, and achy back pain.  She reports that the back and neck pain complaints are not new and related to her history of arthritis.  She has not had any significant cough, fever, headache, change in vision, focal weakness, dysuria, or change in bowel habits.  ED Course: Upon admission into the emergency department patient was seen to be afebrile, pulse 80-106, respirations 15-30, blood pressures 137/94-172/74, and O2 saturation maintained on room air.  Orthostatic vital signs were noted to be positive after standing for 3 minutes. Labs significant for MCV 103.4, MCH 34.4, sodium 131, chloride 97, CO2 18, BUN 21, creatinine 1.01, anion gap 16, lactic acid 4.6->3.8, and troponin 42->76. CXR showed no acute abnormality and UA negative for any signs of infection.  Blood cultures were ordered.  She was started on normal saline IV fluids at 100 mL/h.  She has had no more nausea or vomiting since admission, but still does not feel well.  Review of systems: A complete 10 point review of systems was performed and negative except for as noted above in HPI  Past Medical History:  Diagnosis Date  .  Anxiety   . GERD (gastroesophageal reflux disease)   . Hypertension   . Hypothyroid   . LBBB (left bundle branch block)   . Osteopenia   . Peripheral neuropathy   . Venous insufficiency     Past Surgical History:  Procedure Laterality Date  . ABDOMINAL HYSTERECTOMY    . BLADDER SURGERY    . LEFT HEART CATHETERIZATION WITH CORONARY ANGIOGRAM N/A 09/02/2013   Procedure: LEFT HEART CATHETERIZATION WITH CORONARY ANGIOGRAM;  Surgeon: [name], MD;  Location: [facility] CATH LAB;  Service: Cardiovascular;  Laterality: N/A;  . RENAL ANGIOGRAM N/A 09/02/2013   Procedure: RENAL ANGIOGRAM;  Surgeon: [name], MD;  Location: [facility] CATH LAB;  Service: Cardiovascular;  Laterality: N/A;     reports that she has never smoked. She has never used smokeless tobacco. She reports current alcohol use. She reports that she does not use drugs.  Allergies  Allergen Reactions  . Hydrochlorothiazide Other (See Comments)    REACTION: hyponatremia  . Ramipril Other (See Comments)    REACTION: GI upset  . Remeron [Mirtazapine] Other (See Comments)    REACTION: weakness  . Sulfa Antibiotics Rash    Family History  Problem Relation Age of Onset  . Hypertension Mother   . Prostate cancer Father     Prior to Admission medications   Medication Sig Start Date End Date Taking? Authorizing Provider  Ascorbic Acid (VITAMIN C PO) Take 1 tablet by mouth daily.   Yes [provider]  carvedilol (COREG) 6.25 MG tablet Take 1 tablet (6.25 mg total) by mouth 2 (two)  times daily. 06/23/14  Yes Minus Breeding, MD  Cholecalciferol (VITAMIN D PO) Take 1 tablet by mouth daily.   Yes [provider]  Cyanocobalamin (VITAMIN B-12 PO) Take 1 tablet by mouth daily.   Yes [provider]  diclofenac sodium (VOLTAREN) 1 % GEL Apply 4 g topically 4 (four) times daily. Patient taking differently: Apply 4 g topically 4 (four) times daily as needed (pain).  10/02/18  Yes Plunkett, Loree Fee, MD  estradiol  (ESTRACE) 1 MG tablet Take 1 mg by mouth daily.   Yes [provider]  HYDROcodone-acetaminophen (NORCO/VICODIN) 5-325 MG per tablet Take 1 tablet by mouth daily as needed for moderate pain.   Yes [provider]  levothyroxine (SYNTHROID, LEVOTHROID) 50 MCG tablet Take 50 mcg by mouth daily before breakfast.   Yes [provider]  LORazepam (ATIVAN) 0.5 MG tablet Take 0.5 mg by mouth at bedtime as needed for sleep.    Yes [provider]  losartan (COZAAR) 50 MG tablet Take 50 mg by mouth daily.   Yes [provider]  MELATONIN PO Take 1 mL by mouth at bedtime.   Yes [provider]  Multiple Vitamins-Minerals (HAIR SKIN AND NAILS FORMULA PO) Take 1 tablet by mouth daily.   Yes [provider]  omeprazole (PRILOSEC) 20 MG capsule Take 20 mg by mouth daily as needed (for reflux).   Yes [provider]  TURMERIC CURCUMIN PO Take 1 tablet by mouth daily.    Yes [provider]    Physical Exam:  Constitutional: Early female who appears to not feel well but in no acute distress at this time Vitals:   11/12/18 0745 11/12/18 0800 11/12/18 0815 11/12/18 0830  BP:  (!) 154/80 (!) 166/84 (!) 137/94  Pulse:   95 96  Resp: 19 (!) 26 (!) 23 (!) 25  Temp:      TempSrc:      SpO2:   96% 98%   Eyes: PERRL, lids and conjunctivae normal ENMT: Mucous membranes are moist. Posterior pharynx clear of any exudate or lesions.Normal dentition.  Neck: normal, supple, no masses, no thyromegaly Respiratory: clear to auscultation bilaterally, no wheezing, no crackles. Normal respiratory effort. No accessory muscle use.  Cardiovascular: Regular rate and rhythm, no murmurs / rubs / gallops. No extremity edema. 2+ pedal pulses. No carotid bruits.  Abdomen: no tenderness, no masses palpated. No hepatosplenomegaly. Bowel sounds positive.  Musculoskeletal: no clubbing / cyanosis. No joint deformity upper and lower extremities. Good ROM, no  contractures. Normal muscle tone.  Skin: no rashes, lesions, ulcers. No induration Neurologic: CN 2-12 grossly intact. Sensation intact, DTR normal. Strength 5/5 in all 4.  Psychiatric: Normal judgment and insight. Alert and oriented x 3. Normal mood.     Labs on Admission: I have personally reviewed following labs and imaging studies  CBC: Recent Labs  Lab 11/12/18 0210  WBC 9.6  NEUTROABS 6.2  HGB 12.3  HCT 37.0  MCV 103.4*  PLT 176   Basic Metabolic Panel: Recent Labs  Lab 11/12/18 0210  NA 131*  K 4.5  CL 97*  CO2 18*  GLUCOSE 156*  BUN 21  CREATININE 1.01*  CALCIUM 9.4   GFR: CrCl cannot be calculated (Unknown ideal weight.). Liver Function Tests: Recent Labs  Lab 11/12/18 0210  AST 26  ALT 16  ALKPHOS 42  BILITOT 0.6  PROT 7.1  ALBUMIN 3.9   Recent Labs  Lab 11/12/18 0210  LIPASE 25   No results  for input(s): AMMONIA in the last 168 hours. Coagulation Profile: No results for input(s): INR, PROTIME in the last 168 hours. Cardiac Enzymes: No results for input(s): CKTOTAL, CKMB, CKMBINDEX, TROPONINI in the last 168 hours. BNP (last 3 results) No results for input(s): PROBNP in the last 8760 hours. HbA1C: No results for input(s): HGBA1C in the last 72 hours. CBG: Recent Labs  Lab 11/12/18 0208  GLUCAP 145*   Lipid Profile: No results for input(s): CHOL, HDL, LDLCALC, TRIG, CHOLHDL, LDLDIRECT in the last 72 hours. Thyroid Function Tests: No results for input(s): TSH, T4TOTAL, FREET4, T3FREE, THYROIDAB in the last 72 hours. Anemia Panel: No results for input(s): VITAMINB12, FOLATE, FERRITIN, TIBC, IRON, RETICCTPCT in the last 72 hours. Urine analysis:    Component Value Date/Time   COLORURINE YELLOW 11/12/2018 0222   APPEARANCEUR CLEAR 11/12/2018 0222   LABSPEC 1.012 11/12/2018 0222   PHURINE 5.0 11/12/2018 0222   GLUCOSEU NEGATIVE 11/12/2018 0222   HGBUR NEGATIVE 11/12/2018 0222   BILIRUBINUR NEGATIVE 11/12/2018 0222   KETONESUR 20  (A) 11/12/2018 0222   PROTEINUR NEGATIVE 11/12/2018 0222   NITRITE NEGATIVE 11/12/2018 0222   LEUKOCYTESUR NEGATIVE 11/12/2018 0222   Sepsis Labs: No results found for this or any previous visit (from the past 240 hour(s)).   Radiological Exams on Admission: Dg Chest Port 1 View  Result Date: 11/12/2018 CLINICAL DATA:  Tachypnea EXAM: PORTABLE CHEST 1 VIEW COMPARISON:  01/30/2017 FINDINGS: Low volume chest with asymmetric elevation of the right diaphragm, chronic. Normal heart size and mediastinal contours for technique. There is no edema, consolidation, effusion, or pneumothorax. IMPRESSION: No evidence of acute disease. Electronically Signed   By: Marnee SpringJonathon  Watts M.D.   On: 11/12/2018 05:51   Ct Angio Chest/abd/pel For Dissection W And/or Wo Contrast  Result Date: 11/12/2018 CLINICAL DATA:  Chest and back pain with aortic dissection suspected EXAM: CT ANGIOGRAPHY CHEST, ABDOMEN AND PELVIS TECHNIQUE: Multidetector CT imaging through the chest, abdomen and pelvis was performed using the standard protocol during bolus administration of intravenous contrast. Multiplanar reconstructed images and MIPs were obtained and reviewed to evaluate the vascular anatomy. CONTRAST:  80mL OMNIPAQUE IOHEXOL 350 MG/ML SOLN COMPARISON:  Abdomen and pelvis CT 09/09/2008.  Chest CTA 01/31/2017 FINDINGS: CTA CHEST FINDINGS Cardiovascular: Noncontrast phase shows no intramural hematoma no aortic dissection or aneurysm. No visible pulmonary artery filling defect. Scattered atherosclerotic calcification of the aorta. Right coronary calcified plaque is noted. Mild atheromatous narrowing at the proximal left subclavian artery. Mediastinum/Nodes: No adenopathy or mass. Lungs/Pleura: Mild dependent atelectasis and right upper lobe scarring. There is no edema, consolidation, effusion, or pneumothorax. Musculoskeletal: Diffuse degenerative disc narrowing with endplate ridging. There is exaggerated thoracic kyphosis and mild  scoliosis. Notable T7-8 and T8-9 central disc protrusion. Review of the MIP images confirms the above findings. CTA ABDOMEN AND PELVIS FINDINGS VASCULAR Aorta: Multifocal atherosclerotic plaque. No aneurysm or dissection. Celiac: High-grade narrowing at the celiac origin from atherosclerosis and likely from median arcuate ligament. SMA: Widely patent SMA. No branch occlusion or beading. Prominent pancreatic arcade compensating for the proximal celiac narrowing. Renals: Atherosclerotic plaque at both ostia without flow limiting stenosis. Early branching left renal artery. IMA: Patent Inflow: Atherosclerotic plaque without dissection or aneurysm. Veins: Negative in the arterial phase Review of the MIP images confirms the above findings. NON-VASCULAR Hepatobiliary: No focal liver abnormality.No evidence of biliary obstruction or stone. Pancreas: Unremarkable. Spleen: Unremarkable. Adrenals/Urinary Tract: Negative adrenals. Bilateral hydroureteronephrosis to the level of distended bladder. Stomach/Bowel:  No obstruction.  No evidence  of inflammation. Lymphatic: No acute vascular abnormality.  No mass or adenopathy. Reproductive:Hysterectomy. Other: No ascites or pneumoperitoneum. Musculoskeletal: No acute abnormalities. Severe diffuse lumbar spine degeneration with dextroscoliosis. No acute osseous finding Review of the MIP images confirms the above findings. IMPRESSION: 1. No acute vascular finding including evidence of acute aortic syndrome. 2. Atherosclerosis with proximal celiac narrowing but widely patent SMA and peripancreatic arcade. 3. Distended bladder resulting in bilateral hydroureteronephrosis. Electronically Signed   By: Marnee Spring M.D.   On: 11/12/2018 06:50    EKG: Independently reviewed.  Sinus rhythm 83 bpm with QTc 490, and left bundle branch block.  Assessment/Plan Near syncope: Acute.  Patient presents with complaints of lightheadedness and near syncope.  Was found to have positive  orthostatics with standing for 3 minutes. -Admit to the medical telemetry bed -Recheck orthostatic vital signs in a.m. -Check echocardiogram -Follow-up telemetry overnight -Physical therapy to evaluate and treat  Lactic acidosis, SIRS: Acute.  On admission lactic acid initially elevated up to 4.6 and patient was noted to be tachycardic and tachypneic.  She was given IV fluids with lactic acid trending down to 3.8.  Chest x-ray and urinalysis did not show any acute signs of infection.  Blood cultures were obtained.  Monitoring off antibiotics at this time I suspect symptoms could be secondary to dehydration. -Bolus 500 ml of normal saline IV fluids, then continue at a rate of 100 mL/h -Continue to trend  Elevated troponin: Acute.  On admission troponin noted to be elevated up to 42->76.  Initial EKG without significant ischemic changes. -Follow-up repeat EKG -Give full dose aspirin -Held estradiol due to heart attack risk -Cardiology consulted, we will follow-up for further recommendation  Bilateral hydronephrosis: Foley catheter placed with resolution of urinary retention.  This could be a possible cause for patient abdominal pain complaints. -Continue Foley catheter -Consider voiding trial medically appropriate  Nausea and vomiting, abdominal pain: Acute.  Patient complains of midline abdominal and discomfort. -Advance diet as tolerated -IV fluids seen above  Metabolic acidosis with elevated anion gap: Acute.  CO2 18 with anion gap 16.  Suspect secondary to lactic acidosis. -Continue to monitor  Neck and back pain: Acute on chronic. -Continue Voltaren gel and hydrocodone as needed  Hyponatremia: Acute on chronic.  Sodium 131 on admission, but baseline ranges from 129-136. -IV fluids -Continue to monitor sodium level  Elevated MCV/MCH: MCV and MCH elevated suggesting adamant B12 or folate deficiency.  Patient already taking vitamin B12 supplementation. -Check vitamin B12 and  folate  Essential hypertension: Pressures currently stable -Continue Coreg and losartan  Prolonged QT interval: QTc 490 -Recheck EKG in am   Hypothyroidism: Stable. -Check TSH(0.778) -Continue levothyroxine  GERD -Omeprazole held due to prolonged QT   DVT prophylaxis: Lovenox Code Status: DNR Family Communication: Declined need of update Disposition Plan: To be determined Consults called: Cardiology Admission status: observation  Clydie Braun MD [facility] Pager [phone]   If 7PM-7AM, please contact night-coverage [email or web address] Password [email or web address]  11/12/2018, 9:00 AM

## 2018-11-12 NOTE — ED Provider Notes (Signed)
[facility] EMERGENCY DEPARTMENT Provider Note   CSN: [id number] Arrival date & time: 11/12/18  0143     History   Chief Complaint Chief Complaint  Patient presents with   Nausea    HPI Autumn [name] is a 85 y.o. female with past medical history significant for HTN, LBBB, and GERD who presents to the ED via EMS from [facility] center for acute onset neck and back pain as well as vomiting.  She reports that she began to feel a bit unsteady, lightheaded, and short of breath while brushing her teeth.  She then proceeded to sit down the toilet through the bathroom and that is when she became nauseated with vomiting and posterior neck pain.  On initial exam, patient reports 6 out of 10 right inguinal discomfort and 4 out of 10 posterior neck discomfort, both described as achy.  She states that her nausea has resolved, but she continues to feel short of breath.  She denies any blurred vision, other focal deficits, headache, cough, recent illness, fevers, leg swelling, changes in bowel habits, or urinary symptoms.     HPI  Past Medical History:  Diagnosis Date   Anxiety    GERD (gastroesophageal reflux disease)    Hypertension    Hypothyroid    LBBB (left bundle branch block)    Osteopenia    Peripheral neuropathy    Venous insufficiency     Patient Active Problem List   Diagnosis Date Noted   Normal coronary arteries 09/19/2013   LBBB (left bundle branch block) 09/19/2013   Congestive dilated cardiomyopathy (HCC) 07/24/2013    Past Surgical History:  Procedure Laterality Date   ABDOMINAL HYSTERECTOMY     BLADDER SURGERY     LEFT HEART CATHETERIZATION WITH CORONARY ANGIOGRAM N/A 09/02/2013   Procedure: LEFT HEART CATHETERIZATION WITH CORONARY ANGIOGRAM;  Surgeon: [name], MD;  Location: [facility] CATH LAB;  Service: Cardiovascular;  Laterality: N/A;   RENAL ANGIOGRAM N/A 09/02/2013   Procedure: RENAL ANGIOGRAM;  Surgeon: [name], MD;  Location: [facility] CATH LAB;  Service: Cardiovascular;  Laterality: N/A;     OB History   No obstetric history on file.      Home Medications    Prior to Admission medications   Medication Sig Start Date End Date Taking? Authorizing Provider  Ascorbic Acid (VITAMIN C PO) Take 1 tablet by mouth daily.   Yes [provider]  carvedilol (COREG) 6.25 MG tablet Take 1 tablet (6.25 mg total) by mouth 2 (two) times daily. 06/23/14  Yes [name], MD  Cholecalciferol (VITAMIN D PO) Take 1 tablet by mouth daily.   Yes [provider]  Cyanocobalamin (VITAMIN B-12 PO) Take 1 tablet by mouth daily.   Yes [provider]  diclofenac sodium (VOLTAREN) 1 % GEL Apply 4 g topically 4 (four) times daily. Patient taking differently: Apply 4 g topically 4 (four) times daily as needed (pain).  10/02/18  Yes Plunkett, Alphonzo Lemmings, MD  estradiol (ESTRACE) 1 MG tablet Take 1 mg by mouth daily.   Yes [provider]  HYDROcodone-acetaminophen (NORCO/VICODIN) 5-325 MG per tablet Take 1 tablet by mouth daily as needed for moderate pain.   Yes [provider]  levothyroxine (SYNTHROID, LEVOTHROID) 50 MCG tablet Take 50 mcg by mouth daily before breakfast.   Yes [provider]  LORazepam (ATIVAN) 0.5 MG tablet Take 0.5 mg by mouth at bedtime as needed for sleep.    Yes [provider]  losartan (COZAAR) 50 MG tablet Take 50 mg by mouth daily.   Yes [provider]  MELATONIN PO Take 1 mL by mouth at bedtime.   Yes [provider]  Multiple Vitamins-Minerals (HAIR SKIN AND NAILS FORMULA PO) Take 1 tablet by mouth daily.   Yes [provider]  omeprazole (PRILOSEC) 20 MG capsule Take 20 mg by mouth daily as needed (for reflux).   Yes [provider]  TURMERIC CURCUMIN PO Take 1 tablet by mouth daily.    Yes [provider]    Family History Family History  Problem Relation Age of Onset    Hypertension Mother    Prostate cancer Father     Social History Social History   Tobacco Use   Smoking status: Never Smoker   Smokeless tobacco: Never Used  Substance Use Topics   Alcohol use: Yes    Frequency: Never    Comment: Once every three months    Drug use: No     Allergies   Hydrochlorothiazide, Ramipril, Remeron [mirtazapine], and Sulfa antibiotics   Review of Systems Review of Systems  All other systems reviewed and are negative.    Physical Exam Updated Vital Signs BP 139/76    Pulse 92    Temp 97.6 F (36.4 C) (Oral)    Resp (!) 24    SpO2 97%   Physical Exam Vitals signs and nursing note reviewed. Exam conducted with a chaperone present.  Constitutional:      Appearance: Normal appearance.  HENT:     Head: Normocephalic and atraumatic.  Eyes:     General: No scleral icterus.    Conjunctiva/sclera: Conjunctivae normal.  Cardiovascular:     Rate and Rhythm: Normal rate and regular rhythm.     Pulses: Normal pulses.  Pulmonary:     Comments: Mildly increased work of breathing.  No abnormal breath sounds and not in acute respiratory distress. Abdominal:     Comments: Soft, nondistended.  No guarding.  No overlying erythema or discoloration.  Mild tenderness to palpation in right lower quadrant, specifically inguinal area.  Skin:    General: Skin is dry.  Neurological:     Mental Status: She is alert.     GCS: GCS eye subscore is 4. GCS verbal subscore is 5. GCS motor subscore is 6.     Comments: CN II through XII grossly intact.  No focal deficits.  Strength intact. HINTS and cerebellar exam both negative.  Psychiatric:        Mood and Affect: Mood normal.        Behavior: Behavior normal.     Comments: Short-term memory strained when answering questions, but ultimately recalls events of tonight.  Alert and oriented x3.      ED Treatments / Results  Labs (all labs ordered are listed, but only abnormal results are displayed) Labs  Reviewed  CBC WITH DIFFERENTIAL/PLATELET - Abnormal; Notable for the following components:      Result Value   RBC 3.58 (*)    MCV 103.4 (*)    MCH 34.4 (*)    All other components within normal limits  COMPREHENSIVE METABOLIC PANEL - Abnormal; Notable for the following components:   Sodium 131 (*)    Chloride 97 (*)    CO2 18 (*)    Glucose, Bld 156 (*)    Creatinine, Ser 1.01 (*)    GFR calc non Af Amer 51 (*)    GFR calc Af Amer 59 (*)  Anion gap 16 (*)    All other components within normal limits  URINALYSIS, ROUTINE W REFLEX MICROSCOPIC - Abnormal; Notable for the following components:   Ketones, ur 20 (*)    All other components within normal limits  LACTIC ACID, PLASMA - Abnormal; Notable for the following components:   Lactic Acid, Venous 4.6 (*)    All other components within normal limits  CBG MONITORING, ED - Abnormal; Notable for the following components:   Glucose-Capillary 145 (*)    All other components within normal limits  TROPONIN I (HIGH SENSITIVITY) - Abnormal; Notable for the following components:   Troponin I (High Sensitivity) 42 (*)    All other components within normal limits  TROPONIN I (HIGH SENSITIVITY) - Abnormal; Notable for the following components:   Troponin I (High Sensitivity) 76 (*)    All other components within normal limits  SARS CORONAVIRUS 2 (TAT 6-24 HRS)  LIPASE, BLOOD  BRAIN NATRIURETIC PEPTIDE  SALICYLATE LEVEL  LACTIC ACID, PLASMA    EKG EKG Interpretation  Date/Time:  Tuesday November 12 2018 02:02:01 EDT Ventricular Rate:  83 PR Interval:    QRS Duration: 138 QT Interval:  417 QTC Calculation: 490 R Axis:   -9 Text Interpretation:  Sinus rhythm Left atrial enlargement Left bundle branch block No significant change since last tracing Confirmed by Addison Lank [phone]) on 11/12/2018 2:35:32 AM   Radiology Dg Chest Port 1 View  Result Date: 11/12/2018 CLINICAL DATA:  Tachypnea EXAM: PORTABLE CHEST 1 VIEW  COMPARISON:  01/30/2017 FINDINGS: Low volume chest with asymmetric elevation of the right diaphragm, chronic. Normal heart size and mediastinal contours for technique. There is no edema, consolidation, effusion, or pneumothorax. IMPRESSION: No evidence of acute disease. Electronically Signed   By: Monte Fantasia M.D.   On: 11/12/2018 05:51   Ct Angio Chest/abd/pel For Dissection W And/or Wo Contrast  Result Date: 11/12/2018 CLINICAL DATA:  Chest and back pain with aortic dissection suspected EXAM: CT ANGIOGRAPHY CHEST, ABDOMEN AND PELVIS TECHNIQUE: Multidetector CT imaging through the chest, abdomen and pelvis was performed using the standard protocol during bolus administration of intravenous contrast. Multiplanar reconstructed images and MIPs were obtained and reviewed to evaluate the vascular anatomy. CONTRAST:  32mL OMNIPAQUE IOHEXOL 350 MG/ML SOLN COMPARISON:  Abdomen and pelvis CT 09/09/2008.  Chest CTA 01/31/2017 FINDINGS: CTA CHEST FINDINGS Cardiovascular: Noncontrast phase shows no intramural hematoma no aortic dissection or aneurysm. No visible pulmonary artery filling defect. Scattered atherosclerotic calcification of the aorta. Right coronary calcified plaque is noted. Mild atheromatous narrowing at the proximal left subclavian artery. Mediastinum/Nodes: No adenopathy or mass. Lungs/Pleura: Mild dependent atelectasis and right upper lobe scarring. There is no edema, consolidation, effusion, or pneumothorax. Musculoskeletal: Diffuse degenerative disc narrowing with endplate ridging. There is exaggerated thoracic kyphosis and mild scoliosis. Notable T7-8 and T8-9 central disc protrusion. Review of the MIP images confirms the above findings. CTA ABDOMEN AND PELVIS FINDINGS VASCULAR Aorta: Multifocal atherosclerotic plaque. No aneurysm or dissection. Celiac: High-grade narrowing at the celiac origin from atherosclerosis and likely from median arcuate ligament. SMA: Widely patent SMA. No branch occlusion  or beading. Prominent pancreatic arcade compensating for the proximal celiac narrowing. Renals: Atherosclerotic plaque at both ostia without flow limiting stenosis. Early branching left renal artery. IMA: Patent Inflow: Atherosclerotic plaque without dissection or aneurysm. Veins: Negative in the arterial phase Review of the MIP images confirms the above findings. NON-VASCULAR Hepatobiliary: No focal liver abnormality.No evidence of biliary obstruction or stone. Pancreas: Unremarkable. Spleen: Unremarkable. Adrenals/Urinary  Tract: Negative adrenals. Bilateral hydroureteronephrosis to the level of distended bladder. Stomach/Bowel:  No obstruction.  No evidence of inflammation. Lymphatic: No acute vascular abnormality.  No mass or adenopathy. Reproductive:Hysterectomy. Other: No ascites or pneumoperitoneum. Musculoskeletal: No acute abnormalities. Severe diffuse lumbar spine degeneration with dextroscoliosis. No acute osseous finding Review of the MIP images confirms the above findings. IMPRESSION: 1. No acute vascular finding including evidence of acute aortic syndrome. 2. Atherosclerosis with proximal celiac narrowing but widely patent SMA and peripancreatic arcade. 3. Distended bladder resulting in bilateral hydroureteronephrosis. Electronically Signed   By: Marnee SpringJonathon  Watts M.D.   On: 11/12/2018 06:50    Procedures .Critical Care Performed by: Lorelee NewGreen, Braeley Buskey L, PA-C Authorized by: Lorelee NewGreen, Yoselin Amerman L, PA-C   Critical care provider statement:    Critical care time (minutes):  30   Critical care was necessary to treat or prevent imminent or life-threatening deterioration of the following conditions:  Metabolic crisis   (including critical care time)  Medications Ordered in ED Medications  sodium chloride 0.9 % bolus 500 mL (0 mLs Intravenous Stopped 11/12/18 0427)  sodium chloride 0.9 % bolus 1,000 mL (1,000 mLs Intravenous New Bag/Given 11/12/18 0547)  iohexol (OMNIPAQUE) 350 MG/ML injection 80 mL (80  mLs Intravenous Contrast Given 11/12/18 0602)     Initial Impression / Assessment and Plan / ED Course  I have reviewed the triage vital signs and the nursing notes.  Pertinent labs & imaging results that were available during my care of the patient were reviewed by me and considered in my medical decision making (see chart for details).  Clinical Course as of Nov 11 712  Tue Nov 12, 2018  [id number] Lactic Acid, Venous(!!): 4.6 [GG]  0644 Salicylate Lvl: <7.0 [GG]  0644 Troponin I (High Sensitivity)(!): 76 [GG]  0714 CO2(!): 18 [GG]    Clinical Course User Index [GG] Lorelee NewGreen, Ji Feldner L, PA-C       Patient is hemodynamically stable on exam.  She denies any loss of consciousness, but her report of feeling lightheaded and unsteady while brushing her teeth followed by nausea and vomiting after sitting down the toilet is consistent with a presyncopal event, which is also why she had been evaluated in the ED last month and ultimately diagnosed with dehydration.  She reports that she has been eating and drinking well these past few days, but her strained memory makes her history a bit unreliable.  Orthostatics demonstrated drop in systolic blood pressure > 20 mmHg after standing.  Her acute onset of symptoms of disequilibrium, posterior neck discomfort, and nausea and vomiting is concerning for a cerebellar stroke, but her cerebellar exam and HINTS exams were normal.  Also considered a vertebral dissection, but she denies any headache.  Cranial nerves also intact and entire neurologic examination was reassuring.   Her acute onset shortness of breath and increased work of breathing was also initially concerning for pulmonary embolism, but she denies any recent leg swelling and her symptoms are better explained by a presyncopal event, likely orthostatic in etiology.  The symptoms are also concerning for ACS, but her EKG was unchanged with prior tracing and she denies any and all chest pain.   Patient has  lactic acidosis to 4.6.  Etiology unclear, but not septic.  Patient has a compensatory respiratory alkalosis which explains her tachypnea and increased work of breathing.  Salicylate level was normal.  Additionally, patient has mildly elevated troponin levels that have continued to trend up.  Based on these findings, patient needs  to be admitted for further evaluation and management.  CTA obtained was negative for PE or dissection.  Performed COVID-19 screening prior to admission.    Final Clinical Impressions(s) / ED Diagnoses   Final diagnoses:  Tachypnea  Lactic acidosis    ED Discharge Orders    None       Lorelee New, PA-C 11/12/18 0729    Nira Conn, MD 11/20/18 [phone]

## 2018-11-13 ENCOUNTER — Observation Stay (HOSPITAL_COMMUNITY): Payer: Medicare Other

## 2018-11-13 DIAGNOSIS — M79609 Pain in unspecified limb: Secondary | ICD-10-CM | POA: Diagnosis not present

## 2018-11-13 DIAGNOSIS — N1330 Unspecified hydronephrosis: Secondary | ICD-10-CM

## 2018-11-13 DIAGNOSIS — Z66 Do not resuscitate: Secondary | ICD-10-CM | POA: Diagnosis present

## 2018-11-13 DIAGNOSIS — M48061 Spinal stenosis, lumbar region without neurogenic claudication: Secondary | ICD-10-CM | POA: Diagnosis not present

## 2018-11-13 DIAGNOSIS — M1990 Unspecified osteoarthritis, unspecified site: Secondary | ICD-10-CM | POA: Diagnosis not present

## 2018-11-13 DIAGNOSIS — I63449 Cerebral infarction due to embolism of unspecified cerebellar artery: Secondary | ICD-10-CM | POA: Diagnosis not present

## 2018-11-13 DIAGNOSIS — R55 Syncope and collapse: Secondary | ICD-10-CM | POA: Diagnosis not present

## 2018-11-13 DIAGNOSIS — U071 COVID-19: Secondary | ICD-10-CM | POA: Diagnosis not present

## 2018-11-13 DIAGNOSIS — E860 Dehydration: Secondary | ICD-10-CM | POA: Diagnosis present

## 2018-11-13 DIAGNOSIS — G8929 Other chronic pain: Secondary | ICD-10-CM

## 2018-11-13 DIAGNOSIS — R112 Nausea with vomiting, unspecified: Secondary | ICD-10-CM | POA: Diagnosis present

## 2018-11-13 DIAGNOSIS — M5124 Other intervertebral disc displacement, thoracic region: Secondary | ICD-10-CM | POA: Diagnosis present

## 2018-11-13 DIAGNOSIS — K219 Gastro-esophageal reflux disease without esophagitis: Secondary | ICD-10-CM | POA: Diagnosis not present

## 2018-11-13 DIAGNOSIS — I339 Acute and subacute endocarditis, unspecified: Secondary | ICD-10-CM | POA: Diagnosis not present

## 2018-11-13 DIAGNOSIS — R7989 Other specified abnormal findings of blood chemistry: Secondary | ICD-10-CM | POA: Diagnosis not present

## 2018-11-13 DIAGNOSIS — R1010 Upper abdominal pain, unspecified: Secondary | ICD-10-CM | POA: Diagnosis not present

## 2018-11-13 DIAGNOSIS — I5043 Acute on chronic combined systolic (congestive) and diastolic (congestive) heart failure: Secondary | ICD-10-CM | POA: Diagnosis present

## 2018-11-13 DIAGNOSIS — I63442 Cerebral infarction due to embolism of left cerebellar artery: Secondary | ICD-10-CM | POA: Diagnosis not present

## 2018-11-13 DIAGNOSIS — R42 Dizziness and giddiness: Secondary | ICD-10-CM

## 2018-11-13 DIAGNOSIS — I110 Hypertensive heart disease with heart failure: Secondary | ICD-10-CM | POA: Diagnosis present

## 2018-11-13 DIAGNOSIS — I313 Pericardial effusion (noninflammatory): Secondary | ICD-10-CM | POA: Diagnosis present

## 2018-11-13 DIAGNOSIS — E871 Hypo-osmolality and hyponatremia: Secondary | ICD-10-CM | POA: Diagnosis present

## 2018-11-13 DIAGNOSIS — I63542 Cerebral infarction due to unspecified occlusion or stenosis of left cerebellar artery: Secondary | ICD-10-CM | POA: Diagnosis not present

## 2018-11-13 DIAGNOSIS — I059 Rheumatic mitral valve disease, unspecified: Secondary | ICD-10-CM | POA: Diagnosis not present

## 2018-11-13 DIAGNOSIS — Z20828 Contact with and (suspected) exposure to other viral communicable diseases: Secondary | ICD-10-CM | POA: Diagnosis present

## 2018-11-13 DIAGNOSIS — I774 Celiac artery compression syndrome: Secondary | ICD-10-CM | POA: Diagnosis present

## 2018-11-13 DIAGNOSIS — I669 Occlusion and stenosis of unspecified cerebral artery: Secondary | ICD-10-CM | POA: Diagnosis not present

## 2018-11-13 DIAGNOSIS — E872 Acidosis: Secondary | ICD-10-CM

## 2018-11-13 DIAGNOSIS — I2510 Atherosclerotic heart disease of native coronary artery without angina pectoris: Secondary | ICD-10-CM | POA: Diagnosis present

## 2018-11-13 DIAGNOSIS — M4186 Other forms of scoliosis, lumbar region: Secondary | ICD-10-CM | POA: Diagnosis present

## 2018-11-13 DIAGNOSIS — I248 Other forms of acute ischemic heart disease: Secondary | ICD-10-CM | POA: Diagnosis present

## 2018-11-13 DIAGNOSIS — I340 Nonrheumatic mitral (valve) insufficiency: Secondary | ICD-10-CM | POA: Diagnosis not present

## 2018-11-13 DIAGNOSIS — I447 Left bundle-branch block, unspecified: Secondary | ICD-10-CM | POA: Diagnosis not present

## 2018-11-13 DIAGNOSIS — M542 Cervicalgia: Secondary | ICD-10-CM | POA: Diagnosis not present

## 2018-11-13 DIAGNOSIS — I10 Essential (primary) hypertension: Secondary | ICD-10-CM

## 2018-11-13 DIAGNOSIS — I76 Septic arterial embolism: Secondary | ICD-10-CM | POA: Diagnosis not present

## 2018-11-13 DIAGNOSIS — I428 Other cardiomyopathies: Secondary | ICD-10-CM | POA: Diagnosis present

## 2018-11-13 DIAGNOSIS — R7881 Bacteremia: Secondary | ICD-10-CM | POA: Diagnosis not present

## 2018-11-13 DIAGNOSIS — I361 Nonrheumatic tricuspid (valve) insufficiency: Secondary | ICD-10-CM | POA: Diagnosis not present

## 2018-11-13 DIAGNOSIS — E039 Hypothyroidism, unspecified: Secondary | ICD-10-CM | POA: Diagnosis not present

## 2018-11-13 DIAGNOSIS — I951 Orthostatic hypotension: Secondary | ICD-10-CM | POA: Diagnosis present

## 2018-11-13 DIAGNOSIS — I82612 Acute embolism and thrombosis of superficial veins of left upper extremity: Secondary | ICD-10-CM | POA: Diagnosis not present

## 2018-11-13 DIAGNOSIS — B9561 Methicillin susceptible Staphylococcus aureus infection as the cause of diseases classified elsewhere: Secondary | ICD-10-CM | POA: Diagnosis not present

## 2018-11-13 DIAGNOSIS — I330 Acute and subacute infective endocarditis: Secondary | ICD-10-CM | POA: Diagnosis not present

## 2018-11-13 DIAGNOSIS — E873 Alkalosis: Secondary | ICD-10-CM | POA: Diagnosis present

## 2018-11-13 DIAGNOSIS — M549 Dorsalgia, unspecified: Secondary | ICD-10-CM

## 2018-11-13 DIAGNOSIS — I6340 Cerebral infarction due to embolism of unspecified cerebral artery: Secondary | ICD-10-CM | POA: Diagnosis not present

## 2018-11-13 DIAGNOSIS — R0682 Tachypnea, not elsewhere classified: Secondary | ICD-10-CM | POA: Diagnosis present

## 2018-11-13 DIAGNOSIS — N179 Acute kidney failure, unspecified: Secondary | ICD-10-CM | POA: Diagnosis not present

## 2018-11-13 DIAGNOSIS — A4101 Sepsis due to Methicillin susceptible Staphylococcus aureus: Secondary | ICD-10-CM | POA: Diagnosis not present

## 2018-11-13 DIAGNOSIS — B9562 Methicillin resistant Staphylococcus aureus infection as the cause of diseases classified elsewhere: Secondary | ICD-10-CM | POA: Diagnosis not present

## 2018-11-13 DIAGNOSIS — I272 Pulmonary hypertension, unspecified: Secondary | ICD-10-CM | POA: Diagnosis present

## 2018-11-13 DIAGNOSIS — Z515 Encounter for palliative care: Secondary | ICD-10-CM | POA: Diagnosis not present

## 2018-11-13 DIAGNOSIS — I639 Cerebral infarction, unspecified: Secondary | ICD-10-CM | POA: Diagnosis not present

## 2018-11-13 LAB — BLOOD CULTURE ID PANEL (REFLEXED)

## 2018-11-13 LAB — BASIC METABOLIC PANEL
Anion gap: 12 (ref 5–15)
Anion gap: 12 (ref 5–15)
BUN: 8 mg/dL (ref 8–23)
BUN: 7 mg/dL — ABNORMAL LOW (ref 8–23)
CO2: 20 mmol/L — ABNORMAL LOW (ref 22–32)
CO2: 21 mmol/L — ABNORMAL LOW (ref 22–32)
Calcium: 9 mg/dL (ref 8.9–10.3)
Calcium: 8.9 mg/dL (ref 8.9–10.3)
Chloride: 98 mmol/L (ref 98–111)
Chloride: 96 mmol/L — ABNORMAL LOW (ref 98–111)
Creatinine, Ser: 0.77 mg/dL (ref 0.44–1.00)
Creatinine, Ser: 0.7 mg/dL (ref 0.44–1.00)
GFR calc Af Amer: >60 mL/min (ref >60)
GFR calc Af Amer: >60 mL/min (ref >60)
GFR calc non Af Amer: >60 mL/min (ref >60)
GFR calc non Af Amer: >60 mL/min (ref >60)
Glucose, Bld: 118 mg/dL — ABNORMAL HIGH (ref 70–99)
Glucose, Bld: 108 mg/dL — ABNORMAL HIGH (ref 70–99)
Potassium: 3.7 mmol/L (ref 3.5–5.1)
Potassium: 3.6 mmol/L (ref 3.5–5.1)
Sodium: 130 mmol/L — ABNORMAL LOW (ref 135–145)
Sodium: 129 mmol/L — ABNORMAL LOW (ref 135–145)

## 2018-11-13 LAB — CBC
HCT: 35.2 % — ABNORMAL LOW (ref 36.0–46.0)
Hemoglobin: 12.5 g/dL (ref 12.0–15.0)
MCH: 35.4 pg — ABNORMAL HIGH (ref 26.0–34.0)
MCHC: 35.5 g/dL (ref 30.0–36.0)
MCV: 99.7 fL (ref 80.0–100.0)
Platelets: 231 10*3/uL (ref 150–400)
RBC: 3.53 MIL/uL — ABNORMAL LOW (ref 3.87–5.11)
RDW: 12.8 % (ref 11.5–15.5)
WBC: 8.4 10*3/uL (ref 4.0–10.5)
nRBC: 0 % (ref 0.0–0.2)

## 2018-11-13 LAB — FOLATE RBC
Folate, Hemolysate: >620 ng/mL
Folate, RBC: >1797 ng/mL (ref >498)
Hematocrit: 34.5 % (ref 34.0–46.6)

## 2018-11-13 LAB — ECHOCARDIOGRAM COMPLETE

## 2018-11-13 LAB — LACTIC ACID, PLASMA: Lactic Acid, Venous: 1.7 mmol/L (ref 0.5–1.9)

## 2018-11-13 LAB — TROPONIN I (HIGH SENSITIVITY): Troponin I (High Sensitivity): 156 ng/L (ref <18)

## 2018-11-13 MED ORDER — PANTOPRAZOLE SODIUM 40 MG IV SOLR
40.0000 mg | Freq: Two times a day (BID) | INTRAVENOUS | Status: DC
  Administered 2018-11-13 – 2018-11-14 (×4): 40 mg via INTRAVENOUS
  Filled 2018-11-13 (×4): qty 40

## 2018-11-13 MED ORDER — SODIUM CHLORIDE 0.9 % IV BOLUS: 500.0000 mL | Freq: Once | INTRAVENOUS | Status: DC

## 2018-11-13 MED ORDER — SODIUM CHLORIDE 0.9 % IV SOLN
INTRAVENOUS | Status: DC
  Administered 2018-11-13: 13:00:00 via INTRAVENOUS

## 2018-11-13 MED ORDER — SODIUM CHLORIDE 0.9 % IV BOLUS
250.0000 mL | Freq: Once | INTRAVENOUS | Status: AC
  Administered 2018-11-13: 15:00:00 250 mL via INTRAVENOUS

## 2018-11-13 MED ORDER — ALUM & MAG HYDROXIDE-SIMETH 200-200-20 MG/5ML PO SUSP
30.0000 mL | Freq: Four times a day (QID) | ORAL | Status: DC | PRN
  Administered 2018-11-13 – 2018-11-25 (×2): 30 mL via ORAL
  Filled 2018-11-13 (×2): qty 30

## 2018-11-13 MED ORDER — SODIUM CHLORIDE 0.9 % IV BOLUS
250.0000 mL | Freq: Once | INTRAVENOUS | Status: AC
  Administered 2018-11-13: 14:00:00 250 mL via INTRAVENOUS

## 2018-11-13 MED ORDER — PERFLUTREN LIPID MICROSPHERE
1.0000 mL | INTRAVENOUS | Status: AC | PRN
  Administered 2018-11-13: 11:00:00 3 mL via INTRAVENOUS
  Filled 2018-11-13: qty 10

## 2018-11-13 NOTE — Evaluation (Signed)
Physical Therapy Evaluation Patient Details Name: Autumn [name] MRN: [id number] DOB: 1932-07-31 Today's Date: 11/13/2018   History of Present Illness  85 y.o. female with past medical history significant for HTN, LBBB, and GERD who presents to the ED via EMS from [facility] center for acute onset neck and back pain as well as vomiting. In ED became orthostatic with 3 minutes of standing and found to have elevated troponing levels. Admitted for observation 9/29.  Clinical Impression  PTA pt living at [facility] apartment. Pt reports independence with household ambulation and occasionally uses walking stick for ambulation in community, independent in ADLs and iADLs. Pt is currently limited in safe mobility by orthostatic response to positional change in presence of generalized weakness. Pt is supervision for bed mobility but unable to progress to standing due to increased dizziness with sitting which does not dissipate with time. Pt request to return to supine after 5 min of sitting EoB. PT currently recommending HHPT level rehab at discharge. PT will continue to follow acutely and assess higher level mobility as pt is able.   Orthostatic BPs  Supine 139/68  Sitting 114/68  Sitting after 3 min 129/68  Standing Unable due to continued dizziness       Follow Up Recommendations Home health PT;Supervision - Intermittent    Equipment Recommendations  Rolling walker with 5" wheels    Recommendations for Other Services       Precautions / Restrictions Precautions Precautions: Fall Precaution Comments: syncope  Restrictions Weight Bearing Restrictions: No      Mobility  Bed Mobility Overal bed mobility: Needs Assistance Bed Mobility: Supine to Sit;Sit to Supine     Supine to sit: Supervision Sit to supine: Supervision   General bed mobility comments: increased time and effort as well as use of bedrails  Transfers Overall transfer level: Needs  assistance   Transfers: Lateral/Scoot Transfers          Lateral/Scoot Transfers: Min guard General transfer comment: min guard for safety for lateral scoot along EoB   Ambulation/Gait             General Gait Details: unable due to dizziness      Balance Overall balance assessment: Needs assistance Sitting-balance support: Feet supported;No upper extremity supported;Single extremity supported;Bilateral upper extremity supported Sitting balance-Leahy Scale: Fair                                       Pertinent Vitals/Pain Pain Assessment: Faces Faces Pain Scale: Hurts a little bit Pain Location: generalized with movement Pain Descriptors / Indicators: Grimacing Pain Intervention(s): Limited activity within patient's tolerance;Monitored during session;Repositioned    Home Living Family/patient expects to be discharged to:: Other (Comment) Living Arrangements: Alone Available Help at Discharge: Available PRN/intermittently Type of Home: Apartment Home Access: Elevator     Home Layout: One level Home Equipment: Grab bars - tub/shower;Grab bars - toilet(walking stick) Additional Comments: [employer] independent living     Prior Function Level of Independence: Independent         Comments: reports occasional use of walking stick in public, independent in ADLs and iADLs, drives     Hand Dominance        Extremity/Trunk Assessment   Upper Extremity Assessment Upper Extremity Assessment: Overall WFL for tasks assessed    Lower Extremity Assessment Lower Extremity Assessment: RLE deficits/detail;LLE deficits/detail RLE Deficits / Details:  ROM WFL, strength grossly 4/5 RLE Sensation: WNL RLE Coordination: WNL LLE Deficits / Details: ROM WFL, strength grossly 4/5 LLE Sensation: WNL LLE Coordination: WNL    Cervical / Trunk Assessment Cervical / Trunk Assessment: Kyphotic  Communication   Communication: No difficulties  Cognition  Arousal/Alertness: Awake/alert Behavior During Therapy: WFL for tasks assessed/performed;Flat affect Overall Cognitive Status: Within Functional Limits for tasks assessed                                        General Comments General comments (skin integrity, edema, etc.): HR max 110 with return to bed, orthostatic with supine to sit        Assessment/Plan    PT Assessment Patient needs continued PT services  PT Problem List Decreased strength;Decreased activity tolerance;Cardiopulmonary status limiting activity       PT Treatment Interventions DME instruction;Gait training;Functional mobility training;Therapeutic activities;Therapeutic exercise;Balance training;Cognitive remediation    PT Goals (Current goals can be found in the Care Plan section)  Acute Rehab PT Goals Patient Stated Goal: get some sleep PT Goal Formulation: With patient Time For Goal Achievement: 11/27/18 Potential to Achieve Goals: Good    Frequency Min 3X/week    AM-PAC PT "6 Clicks" Mobility  Outcome Measure Help needed turning from your back to your side while in a flat bed without using bedrails?: None Help needed moving from lying on your back to sitting on the side of a flat bed without using bedrails?: None Help needed moving to and from a bed to a chair (including a wheelchair)?: A Little Help needed standing up from a chair using your arms (e.g., wheelchair or bedside chair)?: A Little Help needed to walk in hospital room?: A Lot Help needed climbing 3-5 steps with a railing? : A Lot 6 Click Score: 18    End of Session   Activity Tolerance: Other (comment)(limited by dizziness) Patient left: in bed;with call bell/phone within reach;with bed alarm set Nurse Communication: Mobility status;Other (comment)(orthostatics) PT Visit Diagnosis: Muscle weakness (generalized) (M62.81);Dizziness and giddiness (R42)    Time: 5809-9833 PT Time Calculation (min) (ACUTE ONLY): 33  min   Charges:   PT Evaluation $PT Eval Moderate Complexity: 1 Mod PT Treatments $Therapeutic Activity: 8-22 mins        Manika Hast B. Migdalia Dk PT, DPT Acute Rehabilitation Services Pager [phone] Office [phone]   [place] 11/13/2018, 10:24 AM

## 2018-11-13 NOTE — Progress Notes (Signed)
Progress Note  Patient Name: Autumn [name] Date of Encounter: 11/13/2018  Primary Cardiologist: [name], MD   Subjective   No acute overnight events. Patient feeling very tired this morning because she did not sleep well. She is still having some abdominal pain with nausea but has not had any further episodes of vomiting. No chest pain, shortness of breath, palpitations, or dizziness.  When discussing symptoms that brought her in, patient states she had sudden onset of lightheadedness and weakness and felt like she could not walk because she was unsteady on her feet. She states she is more concerned about that than her nausea/vomting. She also states she recently started seeing a GI doctor and may have gastritis.  Inpatient Medications    Scheduled Meds:  aspirin  325 mg Oral Daily   carvedilol  6.25 mg Oral BID   Chlorhexidine Gluconate Cloth  6 each Topical Daily   enoxaparin (LOVENOX) injection  40 mg Subcutaneous Q24H   levothyroxine  50 mcg Oral QAC breakfast   losartan  50 mg Oral Daily   sodium chloride flush  3 mL Intravenous Q12H   vitamin B-12  1,000 mcg Oral Daily   Continuous Infusions:  sodium chloride Stopped (11/12/18 1750)   sodium chloride     PRN Meds: acetaminophen **OR** acetaminophen, albuterol, alum & mag hydroxide-simeth, diclofenac sodium, HYDROcodone-acetaminophen, LORazepam   Vital Signs    Vitals:   11/12/18 1600 11/12/18 1700 11/12/18 2155 11/13/18 0635  BP: 133/66 (!) 152/78  (!) 149/81  Pulse: 85 82 79 77  Resp: (!) 22 (!) 21 18 18   Temp:   97.7 F (36.5 C) 98 F (36.7 C)  TempSrc:   Oral Oral  SpO2: 100% 97% 96% 95%    Intake/Output Summary (Last 24 hours) at 11/13/2018 0736 Last data filed at 11/13/2018 0636 Gross per 24 hour  Intake 3905.28 ml  Output 1150 ml  Net 2755.28 ml   Last 3 Weights 10/02/2018 01/30/2017 07/10/2014  Weight (lbs) 150 lb 154 lb 149 lb  Weight (kg) 68.04 kg 69.854 kg 67.586 kg       Telemetry    Normal sinus rhythm with rates in the 60's to 90's. - Personally Reviewed  ECG    No new ECG tracing today. - Personally Reviewed  Physical Exam   GEN: 85 year old Caucasian female resting comfortably in no acute distress.   Neck: Supple. No JVD. Cardiac: RRR. No murmurs, rubs, or gallops. Radial and distal pedal pulses 2+ and equal bilaterally. Respiratory: No increased work of breathing. Clear to auscultation bilaterally. GI: Soft, non-distended, and non-tender. Bowel sounds present. MS: No lower extremity edema. No deformities. Skin: Warm and dry. Neuro:  No focal deficits. Psych: Normal affect. Responds appropriately.  Labs    High Sensitivity Troponin:   Recent Labs  Lab 11/12/18 0210 11/12/18 0425 11/12/18 1230 11/12/18 1902 11/13/18 0009  TROPONINIHS 42* 76* 196* 185* 156*      Chemistry Recent Labs  Lab 11/12/18 0210 11/13/18 0009  NA 131* 130*  K 4.5 3.7  CL 97* 98  CO2 18* 20*  GLUCOSE 156* 118*  BUN 21 8  CREATININE 1.01* 0.77  CALCIUM 9.4 9.0  PROT 7.1  --   ALBUMIN 3.9  --   AST 26  --   ALT 16  --   ALKPHOS 42  --   BILITOT 0.6  --   GFRNONAA 51* >60  GFRAA 59* >60  ANIONGAP 16* 12  Hematology Recent Labs  Lab 11/12/18 0210 11/13/18 0009  WBC 9.6 8.4  RBC 3.58* 3.53*  HGB 12.3 12.5  HCT 37.0 35.2*  MCV 103.4* 99.7  MCH 34.4* 35.4*  MCHC 33.2 35.5  RDW 12.5 12.8  PLT 262 231    BNP Recent Labs  Lab 11/12/18 0210  BNP 65.8     DDimer No results for input(s): DDIMER in the last 168 hours.   Radiology    Dg Chest Port 1 View  Result Date: 11/12/2018 CLINICAL DATA:  Tachypnea EXAM: PORTABLE CHEST 1 VIEW COMPARISON:  01/30/2017 FINDINGS: Low volume chest with asymmetric elevation of the right diaphragm, chronic. Normal heart size and mediastinal contours for technique. There is no edema, consolidation, effusion, or pneumothorax. IMPRESSION: No evidence of acute disease. Electronically Signed   By:  Monte Fantasia M.D.   On: 11/12/2018 05:51   Ct Angio Chest/abd/pel For Dissection W And/or Wo Contrast  Result Date: 11/12/2018 CLINICAL DATA:  Chest and back pain with aortic dissection suspected EXAM: CT ANGIOGRAPHY CHEST, ABDOMEN AND PELVIS TECHNIQUE: Multidetector CT imaging through the chest, abdomen and pelvis was performed using the standard protocol during bolus administration of intravenous contrast. Multiplanar reconstructed images and MIPs were obtained and reviewed to evaluate the vascular anatomy. CONTRAST:  26mL OMNIPAQUE IOHEXOL 350 MG/ML SOLN COMPARISON:  Abdomen and pelvis CT 09/09/2008.  Chest CTA 01/31/2017 FINDINGS: CTA CHEST FINDINGS Cardiovascular: Noncontrast phase shows no intramural hematoma no aortic dissection or aneurysm. No visible pulmonary artery filling defect. Scattered atherosclerotic calcification of the aorta. Right coronary calcified plaque is noted. Mild atheromatous narrowing at the proximal left subclavian artery. Mediastinum/Nodes: No adenopathy or mass. Lungs/Pleura: Mild dependent atelectasis and right upper lobe scarring. There is no edema, consolidation, effusion, or pneumothorax. Musculoskeletal: Diffuse degenerative disc narrowing with endplate ridging. There is exaggerated thoracic kyphosis and mild scoliosis. Notable T7-8 and T8-9 central disc protrusion. Review of the MIP images confirms the above findings. CTA ABDOMEN AND PELVIS FINDINGS VASCULAR Aorta: Multifocal atherosclerotic plaque. No aneurysm or dissection. Celiac: High-grade narrowing at the celiac origin from atherosclerosis and likely from median arcuate ligament. SMA: Widely patent SMA. No branch occlusion or beading. Prominent pancreatic arcade compensating for the proximal celiac narrowing. Renals: Atherosclerotic plaque at both ostia without flow limiting stenosis. Early branching left renal artery. IMA: Patent Inflow: Atherosclerotic plaque without dissection or aneurysm. Veins: Negative in the  arterial phase Review of the MIP images confirms the above findings. NON-VASCULAR Hepatobiliary: No focal liver abnormality.No evidence of biliary obstruction or stone. Pancreas: Unremarkable. Spleen: Unremarkable. Adrenals/Urinary Tract: Negative adrenals. Bilateral hydroureteronephrosis to the level of distended bladder. Stomach/Bowel:  No obstruction.  No evidence of inflammation. Lymphatic: No acute vascular abnormality.  No mass or adenopathy. Reproductive:Hysterectomy. Other: No ascites or pneumoperitoneum. Musculoskeletal: No acute abnormalities. Severe diffuse lumbar spine degeneration with dextroscoliosis. No acute osseous finding Review of the MIP images confirms the above findings. IMPRESSION: 1. No acute vascular finding including evidence of acute aortic syndrome. 2. Atherosclerosis with proximal celiac narrowing but widely patent SMA and peripancreatic arcade. 3. Distended bladder resulting in bilateral hydroureteronephrosis. Electronically Signed   By: Monte Fantasia M.D.   On: 11/12/2018 06:50    Cardiac Studies   Echocardiogram 06/24/2014: Study Conclusions: - Left ventricle: The cavity size was normal. Systolic function was   normal. The estimated ejection fraction was in the range of 55%   to 60%. Wall motion was normal; there were no regional wall   motion abnormalities. There was an increased  relative   contribution of atrial contraction to ventricular filling.   Doppler parameters are consistent with abnormal left ventricular   relaxation (grade 1 diastolic dysfunction). - Mitral valve: There was mild regurgitation. - Pulmonary arteries: PA peak pressure: 39 mm Hg (S).  Impressions: - The right ventricular systolic pressure was increased consistent   with mild pulmonary hypertension. _______________  Cardiac Catheterization 09/02/2013: Coronary angiography:  - Coronary dominance: Right - Left mainstem:   NA - Left anterior descending (LAD):   Anomalous origin off the  RCA.  Normal otherwise.   - Left circumflex (LCx):  Essentially a very large branching RI normal throughout its course - Right coronary artery (RCA):  Very large.  Mild proximal luminal irregularities.  PDA large and normal.  PL moderate sized and normal.  LAD arises from the proximal RCA. - Left ventriculography: Left ventricular systolic function is mildly globally reduced, LVEF is estimated at 45, there is no significant mitral regurgitation  - Left renal artery:  Two left renal arteries both widely patent.  Right renal artery:  Mild fibromuscular dysplasia.    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  Mild global LV dysfunction.    Recommendations:   Continue medical management.   Patient Profile   Ms. Faraone is a 85 y.o. female with a history of normal coronaries on cardiac catheterization in 2015, non-ischemic cardiomyopathy with improved EF of 55-60% on last Echo in 2016, LBBB, hypertension, hypothyroidism, GERD, and anxiety who is being seen today for evaluation of elevated troponin in the setting of acute vomiting at the request of Dr. Katrinka Blazing.   Assessment & Plan    Elevated Troponin - High-sensitivity troponin mild elevated at 42 >> 76 >> 196 >> 185 >> 156. - EKG showed normal sinus rhythm with chronic LBBB but no acute changes from prior tracings. - CTA negative for PE or dissection. - Echo has been ordered but not performed yet. - Dr. Diona Browner felt like this was likely demand ischemia in setting of nausea/vomting. Patient denies any angina. Further recommendations pending Echo results.  Non-Ischemic Cardiomyopathy - EF had improved to 55-60% on last Echo in 2016. - Repeat Echo pending.  - Patient euvoelmic on exam. - Continue home Coreg 6.25mg  twice daily and Losartan 50mg  daily. - Continue to monitor volume status closely.  Chronic LBBB - Going back to at least 2015 based on EKGs in our system.  Hypertension - BP mildly  elevated at 149/81 this before before taking any antihypertensive. - Continue Coreg and Losartan as above.  Weakness Vomiting/Nausea - Patient presented from [facility] for acute onset of weakness and nausea/vomiting which resolved on arrival to ED. - Patient afebrile and WBC normal. - Lipase normal. - Lactic acidosis 4.6 >> 3.8 >> 4.3 >> 3.5. - Blood cultures pending. No growth at <12 hours. - COVID negative. - Received IV fluids yesterday. - Management per primary team.  Renal Insufficiency - Creatinine minimally elevated at 1.01 on presentation. Improved to 0.77 this morning after fluids.   For questions or updates, please contact [facility] Please consult [email or web address] for contact info under        Signed, Corrin Parker, PA-C  11/13/2018, 7:36 AM

## 2018-11-13 NOTE — Progress Notes (Signed)
[facility] PROGRESS NOTE  Autumn [name] UEA:[id number] DOB: 12-11-1932 DOA: 11/12/2018 PCP: Lajean Manes, MD  Assessment/Plan:  Near syncope: Acute.  Patient presented with complaints of lightheadedness and near syncope.  Was found to have positive orthostatics with standing for 3 minutes and other s/sx dehydration in setting of abdominal pain/nausea and vomiting and decreased oral intake. No events on tele. No s/sx infection Orthostatic this am with dizziness.  -judicious IV fluids.  -repeat orthostatic vs after bolus -Physical therapy to evaluate and treat  Lactic acidosis, SIRS: Acute. Resolved this am.   On admission lactic acid initially elevated up to 4.6 and patient was noted to be tachycardic and tachypneic.  She was given IV fluids with lactic acid trending down to 1.8.  Chest x-ray and urinalysis did not show any acute signs of infection.  Blood cultures no growth to date. Bolused 500 ml of normal saline IV fluids last evening.  -judicious IV fluids given EF 25%  Elevated troponin: Acute.  On admission troponin noted to be elevated up to 42->76.  Initial EKG without significant ischemic changes. Evaluated by cards who opined symptoms and data inconsistent with ACS and recommend increasing arb and bb. -vascular surgery consult per cardiology for possible intestinal angina  Bilateral hydronephrosis/urinary retention: Foley catheter placed with resolution of urinary retention. -Continue Foley catheter -intake and output -Consider voiding trial medically when able to tolerate  Nausea and vomiting, abdominal pain: Acute.  . -Advance diet  -IV fluids seen above  Metabolic acidosis with elevated anion gap: Acute.  CO2 21 with anion gap 12.  Suspect secondary to lactic acidosis in setting of dehydration -Continue to monitor -adjust IV fluids  Neck and back pain: Acute on chronic. Stable. Worked with PT today.  -Continue Voltaren gel and hydrocodone as  needed  Hyponatremia: Acute on chronic.  Sodium 131 on admission, but baseline ranges from 129-136. She is 129 this am.  - will bolus to rehydrate then stop continuous IV fluids -Continue to monitor sodium level  Elevated MCV/MCH: MCV and MCH elevated suggesting adamant B12 or folate deficiency. B12 level 931.   Patient already taking vitamin B12 supplementation. -Check vitamin B12 and folate -hold supplement for now  Essential hypertension: patient orthostatic and dizzy after 3 minutes. Home meds include coreg and losartan -Continue Coreg and losartan but rehydrate gently -monitor closely  Hypothyroidism: Stable. tsh 0.77 -Continue levothyroxine  GERD -continue PPI   Code Status: full Family Communication: patient Disposition Plan: to bed determined   Consultants:  oneal cardiology  Procedures:  echo  Antibiotics:    HPI/Subjective: Awake alert oriented to self and place. Denies pain/discomfort  Objective: Vitals:   11/12/18 2155 11/13/18 0635  BP:  (!) 149/81  Pulse: 79 77  Resp: 18 18  Temp: 97.7 F (36.5 C) 98 F (36.7 C)  SpO2: 96% 95%    Intake/Output Summary (Last 24 hours) at 11/13/2018 1320 Last data filed at 11/13/2018 1143 Gross per 24 hour  Intake 858.33 ml  Output 1400 ml  Net -541.67 ml   There were no vitals filed for this visit.  Exam:   General:  Slightly pale no acute distress awake  Cardiovascular: rrr no mgr trace LE edema  Respiratory: normal effort BS clear bilaterally no wheeze or crackles  Abdomen: obese soft +BS no guarding or rebounding  Musculoskeletal: joints without swelling/erythema   Data Reviewed: Basic Metabolic Panel: Recent Labs  Lab 11/12/18 0210 11/13/18 0009 11/13/18 0811  NA 131* 130* 129*  K  4.5 3.7 3.6  CL 97* 98 96*  CO2 18* 20* 21*  GLUCOSE 156* 118* 108*  BUN 21 8 7*  CREATININE 1.01* 0.77 0.70  CALCIUM 9.4 9.0 8.9   Liver Function Tests: Recent Labs  Lab 11/12/18 0210  AST  26  ALT 16  ALKPHOS 42  BILITOT 0.6  PROT 7.1  ALBUMIN 3.9   Recent Labs  Lab 11/12/18 0210  LIPASE 25   No results for input(s): AMMONIA in the last 168 hours. CBC: Recent Labs  Lab 11/12/18 0210 11/13/18 0009  WBC 9.6 8.4  NEUTROABS 6.2  --   HGB 12.3 12.5  HCT 37.0 35.2*  MCV 103.4* 99.7  PLT 262 231   Cardiac Enzymes: No results for input(s): CKTOTAL, CKMB, CKMBINDEX, TROPONINI in the last 168 hours. BNP (last 3 results) Recent Labs    11/12/18 0210  BNP 65.8    ProBNP (last 3 results) No results for input(s): PROBNP in the last 8760 hours.  CBG: Recent Labs  Lab 11/12/18 0208  GLUCAP 145*    Recent Results (from the past 240 hour(s))  Culture, blood (routine x 2)     Status: None (Preliminary result)   Collection Time: 11/12/18  2:10 AM   Specimen: BLOOD  Result Value Ref Range Status   Specimen Description   Final    BLOOD RIGHT ANTECUBITAL Performed at [facility], [address], [place], [place] [address]    Special Requests   Final    BOTTLES DRAWN AEROBIC AND ANAEROBIC Blood Culture adequate volume Performed at [facility], [address], [place], [place] [address]    Culture  Setup Time   Final    GRAM POSITIVE COCCI IN BOTH AEROBIC AND ANAEROBIC BOTTLES Organism ID to follow CRITICAL RESULT CALLED TO, READ BACK BY AND VERIFIED WITH: Verdia Kuba [id number] MLM Performed at [facility], [address] [place], [place] [address]    Culture GRAM POSITIVE COCCI  Final   Report Status PENDING  Incomplete  Blood Culture ID Panel (Reflexed)     Status: Abnormal   Collection Time: 11/12/18  2:10 AM  Result Value Ref Range Status   Enterococcus species NOT DETECTED NOT DETECTED Final   Listeria monocytogenes NOT DETECTED NOT DETECTED Final   Staphylococcus species DETECTED (A) NOT DETECTED Final    Comment: Methicillin (oxacillin) susceptible coagulase negative staphylococcus. Possible blood culture  contaminant (unless isolated from more than one blood culture draw or clinical case suggests pathogenicity). No antibiotic treatment is indicated for blood  culture contaminants. CRITICAL RESULT CALLED TO, READ BACK BY AND VERIFIED WITH: PHARMD T BAUMEISTER [id number] MLM    Staphylococcus aureus (BCID) NOT DETECTED NOT DETECTED Final   Methicillin resistance NOT DETECTED NOT DETECTED Final   Streptococcus species NOT DETECTED NOT DETECTED Final   Streptococcus agalactiae NOT DETECTED NOT DETECTED Final   Streptococcus pneumoniae NOT DETECTED NOT DETECTED Final   Streptococcus pyogenes NOT DETECTED NOT DETECTED Final   Acinetobacter baumannii NOT DETECTED NOT DETECTED Final   Enterobacteriaceae species NOT DETECTED NOT DETECTED Final   Enterobacter cloacae complex NOT DETECTED NOT DETECTED Final   Escherichia coli NOT DETECTED NOT DETECTED Final   Klebsiella oxytoca NOT DETECTED NOT DETECTED Final   Klebsiella pneumoniae NOT DETECTED NOT DETECTED Final   Proteus species NOT DETECTED NOT DETECTED Final   Serratia marcescens NOT DETECTED NOT DETECTED Final   Haemophilus influenzae NOT DETECTED NOT DETECTED Final   Neisseria meningitidis  NOT DETECTED NOT DETECTED Final   Pseudomonas aeruginosa NOT DETECTED NOT DETECTED Final   Candida albicans NOT DETECTED NOT DETECTED Final   Candida glabrata NOT DETECTED NOT DETECTED Final   Candida krusei NOT DETECTED NOT DETECTED Final   Candida parapsilosis NOT DETECTED NOT DETECTED Final   Candida tropicalis NOT DETECTED NOT DETECTED Final    Comment: Performed at [facility], [address], [place], [place] [address]  SARS CORONAVIRUS 2 (TAT 6-24 HRS) Nasopharyngeal Nasopharyngeal Swab     Status: None   Collection Time: 11/12/18  5:47 AM   Specimen: Nasopharyngeal Swab  Result Value Ref Range Status   SARS Coronavirus 2 NEGATIVE NEGATIVE Final    Comment: (NOTE) SARS-CoV-2 target nucleic acids are NOT DETECTED. The SARS-CoV-2 RNA is  generally detectable in upper and lower respiratory specimens during the acute phase of infection. Negative results do not preclude SARS-CoV-2 infection, do not rule out co-infections with other pathogens, and should not be used as the sole basis for treatment or other patient management decisions. Negative results must be combined with clinical observations, patient history, and epidemiological information. The expected result is Negative. Fact Sheet for Patients: HairSlick.no Fact Sheet for Healthcare Providers: quierodirigir.com This test is not yet approved or cleared by the [place] FDA and  has been authorized for detection and/or diagnosis of SARS-CoV-2 by FDA under an Emergency Use Authorization (EUA). This EUA will remain  in effect (meaning this test can be used) for the duration of the COVID-19 declaration under Section 56 4(b)(1) of the Act, 21 U.S.C. section 360bbb-3(b)(1), unless the authorization is terminated or revoked sooner. Performed at [facility], [address] [place], [place] [address]   Culture, blood (routine x 2)     Status: None (Preliminary result)   Collection Time: 11/12/18  7:02 PM   Specimen: BLOOD LEFT HAND  Result Value Ref Range Status   Specimen Description BLOOD LEFT HAND  Final   Special Requests   Final    BOTTLES DRAWN AEROBIC ONLY Blood Culture adequate volume   Culture   Final    NO GROWTH < 12 HOURS Performed at [facility], [address] [place], [place] [address]    Report Status PENDING  Incomplete     Studies: Dg Chest Port 1 View  Result Date: 11/12/2018 CLINICAL DATA:  Tachypnea EXAM: PORTABLE CHEST 1 VIEW COMPARISON:  01/30/2017 FINDINGS: Low volume chest with asymmetric elevation of the right diaphragm, chronic. Normal heart size and mediastinal contours for technique. There is no edema, consolidation, effusion, or pneumothorax. IMPRESSION: No  evidence of acute disease. Electronically Signed   By: Marnee Spring M.D.   On: 11/12/2018 05:51   Ct Angio Chest/abd/pel For Dissection W And/or Wo Contrast  Result Date: 11/12/2018 CLINICAL DATA:  Chest and back pain with aortic dissection suspected EXAM: CT ANGIOGRAPHY CHEST, ABDOMEN AND PELVIS TECHNIQUE: Multidetector CT imaging through the chest, abdomen and pelvis was performed using the standard protocol during bolus administration of intravenous contrast. Multiplanar reconstructed images and MIPs were obtained and reviewed to evaluate the vascular anatomy. CONTRAST:  30mL OMNIPAQUE IOHEXOL 350 MG/ML SOLN COMPARISON:  Abdomen and pelvis CT 09/09/2008.  Chest CTA 01/31/2017 FINDINGS: CTA CHEST FINDINGS Cardiovascular: Noncontrast phase shows no intramural hematoma no aortic dissection or aneurysm. No visible pulmonary artery filling defect. Scattered atherosclerotic calcification of the aorta. Right coronary calcified plaque is noted. Mild atheromatous narrowing at the proximal left subclavian artery. Mediastinum/Nodes: No adenopathy  or mass. Lungs/Pleura: Mild dependent atelectasis and right upper lobe scarring. There is no edema, consolidation, effusion, or pneumothorax. Musculoskeletal: Diffuse degenerative disc narrowing with endplate ridging. There is exaggerated thoracic kyphosis and mild scoliosis. Notable T7-8 and T8-9 central disc protrusion. Review of the MIP images confirms the above findings. CTA ABDOMEN AND PELVIS FINDINGS VASCULAR Aorta: Multifocal atherosclerotic plaque. No aneurysm or dissection. Celiac: High-grade narrowing at the celiac origin from atherosclerosis and likely from median arcuate ligament. SMA: Widely patent SMA. No branch occlusion or beading. Prominent pancreatic arcade compensating for the proximal celiac narrowing. Renals: Atherosclerotic plaque at both ostia without flow limiting stenosis. Early branching left renal artery. IMA: Patent Inflow: Atherosclerotic plaque  without dissection or aneurysm. Veins: Negative in the arterial phase Review of the MIP images confirms the above findings. NON-VASCULAR Hepatobiliary: No focal liver abnormality.No evidence of biliary obstruction or stone. Pancreas: Unremarkable. Spleen: Unremarkable. Adrenals/Urinary Tract: Negative adrenals. Bilateral hydroureteronephrosis to the level of distended bladder. Stomach/Bowel:  No obstruction.  No evidence of inflammation. Lymphatic: No acute vascular abnormality.  No mass or adenopathy. Reproductive:Hysterectomy. Other: No ascites or pneumoperitoneum. Musculoskeletal: No acute abnormalities. Severe diffuse lumbar spine degeneration with dextroscoliosis. No acute osseous finding Review of the MIP images confirms the above findings. IMPRESSION: 1. No acute vascular finding including evidence of acute aortic syndrome. 2. Atherosclerosis with proximal celiac narrowing but widely patent SMA and peripancreatic arcade. 3. Distended bladder resulting in bilateral hydroureteronephrosis. Electronically Signed   By: Marnee SpringJonathon  Watts M.D.   On: 11/12/2018 06:50    Scheduled Meds: . aspirin  325 mg Oral Daily  . carvedilol  6.25 mg Oral BID  . Chlorhexidine Gluconate Cloth  6 each Topical Daily  . enoxaparin (LOVENOX) injection  40 mg Subcutaneous Q24H  . levothyroxine  50 mcg Oral QAC breakfast  . losartan  50 mg Oral Daily  . pantoprazole (PROTONIX) IV  40 mg Intravenous Q12H  . sodium chloride flush  3 mL Intravenous Q12H  . vitamin B-12  1,000 mcg Oral Daily   Continuous Infusions: . sodium chloride 125 mL/hr at 11/13/18 1317  . sodium chloride      Active Problems:   Elevated troponin   Lactic acidosis   Tachypnea   Postural dizziness with near syncope   Nausea and vomiting   Bilateral hydronephrosis   Essential hypertension   Chronic neck and back pain    Time spent: 45 minutes    Kindred Hospital - Los AngelesBLACK,Aeisha M NP [facility]  If 7PM-7AM, please contact night-coverage at  [email or web address], password [email or web address] 11/13/2018, 1:20 PM  LOS: 0 days

## 2018-11-13 NOTE — Progress Notes (Signed)
PHARMACY - PHYSICIAN COMMUNICATION CRITICAL VALUE ALERT - BLOOD CULTURE IDENTIFICATION (BCID)  Autumn [name] is an 85 y.o. female who presented to [facility] on 11/12/2018 with a chief complaint of nausea and vomiting   Assessment:  coagulase negative staph detected in 2/4 bottles, no MecA gene   Name of physician (or Provider) Contacted: Dr. Wynelle Cleveland, MD  Current antibiotics: none  Changes to prescribed antibiotics recommended:  Recommendations accepted by provider  Continue to monitor off antibiotics   Results for orders placed or performed during the hospital encounter of 11/12/18  Blood Culture ID Panel (Reflexed) (Collected: 11/12/2018  2:10 AM)  Result Value Ref Range   Enterococcus species NOT DETECTED NOT DETECTED   Listeria monocytogenes NOT DETECTED NOT DETECTED   Staphylococcus species DETECTED (A) NOT DETECTED   Staphylococcus aureus (BCID) NOT DETECTED NOT DETECTED   Methicillin resistance NOT DETECTED NOT DETECTED   Streptococcus species NOT DETECTED NOT DETECTED   Streptococcus agalactiae NOT DETECTED NOT DETECTED   Streptococcus pneumoniae NOT DETECTED NOT DETECTED   Streptococcus pyogenes NOT DETECTED NOT DETECTED   Acinetobacter baumannii NOT DETECTED NOT DETECTED   Enterobacteriaceae species NOT DETECTED NOT DETECTED   Enterobacter cloacae complex NOT DETECTED NOT DETECTED   Escherichia coli NOT DETECTED NOT DETECTED   Klebsiella oxytoca NOT DETECTED NOT DETECTED   Klebsiella pneumoniae NOT DETECTED NOT DETECTED   Proteus species NOT DETECTED NOT DETECTED   Serratia marcescens NOT DETECTED NOT DETECTED   Haemophilus influenzae NOT DETECTED NOT DETECTED   Neisseria meningitidis NOT DETECTED NOT DETECTED   Pseudomonas aeruginosa NOT DETECTED NOT DETECTED   Candida albicans NOT DETECTED NOT DETECTED   Candida glabrata NOT DETECTED NOT DETECTED   Candida krusei NOT DETECTED NOT DETECTED   Candida parapsilosis NOT DETECTED NOT DETECTED   Candida tropicalis NOT  DETECTED NOT DETECTED    Phillis Haggis 11/13/2018  8:23 AM

## 2018-11-13 NOTE — Progress Notes (Signed)
Orthostatic Vitals  Lying BP: 137/73 Lying Pulse: 80  Sitting BP: 138/76 Sitting Pulse: 88  Standing BP: 114/79 Sitting Pulse: 88  Patient unable to stand for 3 minutes to obtain second standing vitals. Patient felt very lightheaded. Helped patient back into bed. Recovered quickly. Will continue to monitor.

## 2018-11-13 NOTE — Consult Note (Addendum)
Hospital Consult    Reason for Consult:  ? Ischemic bowel Requesting Physician:  Larene Beach (cardiology) MRN #:  [id number]  History of Present Illness: This is a 85 y.o. female who was admitted yesterday with c/o nausea and vomiting and dizziness.  She states that she she had one episode of vomiting.  She states that she has lost about 5lbs over the past month but it is because she just isn't hungry.  She doesn't get sick after eating and she does not have a fear of food.  She does not get nauseated after eating.  She states that she was dizzy and couldn't walk b/c she was afraid she would fall.  She did have a CTA of the abdomen and pelvis.    She has hx of heart failure, hypothyroidism, hypertension, GERD, venous insufficiency.    The pt is not on a statin for cholesterol management.  The pt is on a daily aspirin.   Other AC:  Lovenox for DVT prophylaxis The pt is on BB, ARB for hypertension.   The pt is not diabetic.   Tobacco hx:  never  Past Medical History:  Diagnosis Date  . Anxiety   . GERD (gastroesophageal reflux disease)   . Hypertension   . Hypothyroid   . LBBB (left bundle branch block)   . Osteopenia   . Peripheral neuropathy   . Venous insufficiency     Past Surgical History:  Procedure Laterality Date  . ABDOMINAL HYSTERECTOMY    . BLADDER SURGERY    . LEFT HEART CATHETERIZATION WITH CORONARY ANGIOGRAM N/A 09/02/2013   Procedure: LEFT HEART CATHETERIZATION WITH CORONARY ANGIOGRAM;  Surgeon: [name], MD;  Location: [facility] CATH LAB;  Service: Cardiovascular;  Laterality: N/A;  . RENAL ANGIOGRAM N/A 09/02/2013   Procedure: RENAL ANGIOGRAM;  Surgeon: [name], MD;  Location: [facility] CATH LAB;  Service: Cardiovascular;  Laterality: N/A;    Allergies  Allergen Reactions  . Hydrochlorothiazide Other (See Comments)    REACTION: hyponatremia  . Ramipril Other (See Comments)    REACTION: GI upset  . Remeron [Mirtazapine] Other (See Comments)    REACTION:  weakness  . Sulfa Antibiotics Rash    Prior to Admission medications   Medication Sig Start Date End Date Taking? Authorizing Provider  Ascorbic Acid (VITAMIN C PO) Take 1 tablet by mouth daily.   Yes [provider]  carvedilol (COREG) 6.25 MG tablet Take 1 tablet (6.25 mg total) by mouth 2 (two) times daily. 06/23/14  Yes [name], MD  Cholecalciferol (VITAMIN D PO) Take 1 tablet by mouth daily.   Yes [provider]  Cyanocobalamin (VITAMIN B-12 PO) Take 1 tablet by mouth daily.   Yes [provider]  diclofenac sodium (VOLTAREN) 1 % GEL Apply 4 g topically 4 (four) times daily. Patient taking differently: Apply 4 g topically 4 (four) times daily as needed (pain).  10/02/18  Yes Plunkett, Alphonzo Lemmings, MD  estradiol (ESTRACE) 1 MG tablet Take 1 mg by mouth daily.   Yes [provider]  HYDROcodone-acetaminophen (NORCO/VICODIN) 5-325 MG per tablet Take 1 tablet by mouth daily as needed for moderate pain.   Yes [provider]  levothyroxine (SYNTHROID, LEVOTHROID) 50 MCG tablet Take 50 mcg by mouth daily before breakfast.   Yes [provider]  LORazepam (ATIVAN) 0.5 MG tablet Take 0.5 mg by mouth at bedtime as needed for sleep.    Yes [provider]  losartan (COZAAR) 50 MG tablet Take 50 mg by  mouth daily.   Yes [provider]  MELATONIN PO Take 1 mL by mouth at bedtime.   Yes [provider]  Multiple Vitamins-Minerals (HAIR SKIN AND NAILS FORMULA PO) Take 1 tablet by mouth daily.   Yes [provider]  omeprazole (PRILOSEC) 20 MG capsule Take 20 mg by mouth daily as needed (for reflux).   Yes [provider]  TURMERIC CURCUMIN PO Take 1 tablet by mouth daily.    Yes [provider]    Social History   Socioeconomic History  . Marital status: Widowed    Spouse name: Not on file  . Number of children: 2  . Years of education: Not on file  . Highest education level: Not on  file  Occupational History  . Not on file  Social Needs  . Financial resource strain: Not on file  . Food insecurity    Worry: Not on file    Inability: Not on file  . Transportation needs    Medical: Not on file    Non-medical: Not on file  Tobacco Use  . Smoking status: Never Smoker  . Smokeless tobacco: Never Used  Substance and Sexual Activity  . Alcohol use: Yes    Frequency: Never    Comment: Once every three months   . Drug use: No  . Sexual activity: Not on file  Lifestyle  . Physical activity    Days per week: Not on file    Minutes per session: Not on file  . Stress: Not on file  Relationships  . Social Musician on phone: Not on file    Gets together: Not on file    Attends religious service: Not on file    Active member of club or organization: Not on file    Attends meetings of clubs or organizations: Not on file    Relationship status: Not on file  . Intimate partner violence    Fear of current or ex partner: Not on file    Emotionally abused: Not on file    Physically abused: Not on file    Forced sexual activity: Not on file  Other Topics Concern  . Not on file  Social History Narrative   Lives alone.       Family History  Problem Relation Age of Onset  . Hypertension Mother   . Prostate cancer Father     ROS: [x]  Positive   [ ]  Negative   [ ]  All sytems reviewed and are negative  Cardiac: [x]  heart failure [x]  high blood pressure  Vascular: [x]  venous insufficiency  Pulmonary: []  asthma/wheezing  Neurologic: [x]  neuropathy   Hematologic: []  hx of cancer  Endocrine:  [x]  thyroid disease  GI [x]  GERD  GU: []  CKD/renal failure []  HD  Psychiatric: [x]  anxiety  Musculoskeletal: [x]  osteopenia   Integumentary: []  rashes []  ulcers  Constitutional: []  N/V   Physical Examination  Vitals:   11/12/18 2155 11/13/18 0635  BP:  (!) 149/81  Pulse: 79 77  Resp: 18 18  Temp: 97.7 F (36.5 C) 98 F (36.7 C)   SpO2: 96% 95%   There is no height or weight on file to calculate BMI.  General:  WDWN in NAD Gait: Not observed HENT: WNL, normocephalic Pulmonary: normal non-labored breathing, without Rales, rhonchi,  wheezing Cardiac: regular, without  Murmurs, rubs or gallops; without carotid bruits Abdomen:  soft, NT/ND, no masses; +BS present Skin: without rashes Vascular Exam/Pulses:  Right Left  Radial 2+ (normal) 2+ (normal)  Popliteal Unable to palpate  Unable to palpate   DP 1+ (weak) 1+ (weak)  PT 2+ (normal) 2+ (normal)   Extremities: without ischemic changes, without Gangrene , without cellulitis; without open wounds;  Musculoskeletal: no muscle wasting or atrophy  Neurologic: A&O X 3;  No focal weakness or paresthesias are detected; speech is fluent/normal Psychiatric:  The pt has Normal affect.  CBC    Component Value Date/Time   WBC 8.4 11/13/2018 0009   RBC 3.53 (L) 11/13/2018 0009   HGB 12.5 11/13/2018 0009   HCT 35.2 (L) 11/13/2018 0009   PLT 231 11/13/2018 0009   MCV 99.7 11/13/2018 0009   MCH 35.4 (H) 11/13/2018 0009   MCHC 35.5 11/13/2018 0009   RDW 12.8 11/13/2018 0009   LYMPHSABS 2.3 11/12/2018 0210   MONOABS 0.9 11/12/2018 0210   EOSABS 0.1 11/12/2018 0210   BASOSABS 0.1 11/12/2018 0210    BMET    Component Value Date/Time   NA 129 (L) 11/13/2018 0811   K 3.6 11/13/2018 0811   CL 96 (L) 11/13/2018 0811   CO2 21 (L) 11/13/2018 0811   GLUCOSE 108 (H) 11/13/2018 0811   BUN 7 (L) 11/13/2018 0811   CREATININE 0.70 11/13/2018 0811   CALCIUM 8.9 11/13/2018 0811   GFRNONAA >60 11/13/2018 0811   GFRAA >60 11/13/2018 0811    COAGS: Lab Results  Component Value Date   INR 0.9 08/28/2013     Non-Invasive Vascular Imaging:   CTA abdomen/Pelvis 11/12/2018: IMPRESSION: 1. No acute vascular finding including evidence of acute aortic syndrome. 2. Atherosclerosis with proximal celiac narrowing but widely patent SMA and peripancreatic arcade. 3.  Distended bladder resulting in bilateral hydroureteronephrosis.   ASSESSMENT/PLAN: This is a 85 y.o. female who was admitted with general malaise, nausea, vomiting and dizziness.  -CTA was obtained and she was found to have some narrowing of the celiac proximally.  The SMA is widely patent.  Doubt mesenteric ischemia as she had vomiting once and has not had nausea/vomiting with eating and she does not have a fear of food.  She has lost 5lbs over the past month, but this is due to lack of apetitie.   -Dr. Myra GianottiBrabham will review CTA and see pt this afternoon.    Doreatha MassedSamantha Rhyne, PA-C [facility] [phone]   I agree with the above.  I have seen and evaluated the patient.  Briefly, this is an 85 year old female who was admitted with nausea vomiting and dizziness.  She reports about a 5 pound history of weight loss over the past month.  She does not endorse postprandial abdominal pain.  She has not been eating because she does not feel hungry.  She had a CT scan that I have reviewed that shows a widely patent superior mesenteric artery.  Her infra mesenteric artery is small in caliber but does appear patent.  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  I doubt that this represents chronic mesenteric ischemia given her symptoms.  Most likely her celiac artery stenosis is not responsible for her symptoms.  I would recommend full work-up for her symptoms.  If all other explanations are ruled out, and she continues to have persistent symptoms, I would consider mesenteric angiogram with possible intervention on her celiac artery, however I do not think at this time that this would explain her symptoms.  Please consult me if she does not improve.  I will not actively follow the patient as  I do not think this represents mesenteric ischemia.  She can follow-up in the office in 3 months if needed.  Annamarie Major

## 2018-11-13 NOTE — Plan of Care (Signed)

## 2018-11-13 NOTE — Progress Notes (Signed)
Orthostatic Vitals  Lying BP: 126/59 Lying Pulse: 77  Sitting BP: 111/63 Sitting Pulse: 87  Standing BP: 86/33 Standing Pulse: 92  Sitting BP after 2 minutes recovering: 108/60 Sitting BP after 2 minutes recovering: 85  Patient very lightheaded and needing to sit down last couple of seconds of standing BP. Denies feeling "dizzy".

## 2018-11-13 NOTE — Progress Notes (Signed)
Orthostatic Vitals Post 2nd 250 mL Bolus  Lying BP: 140/67 Lying Pulse: 72  Sitting BP: 134/67 Sitting Pulse: 81  Standing BP: 120/64 Standing Pulse: 88  Standing BP at 3 Minutes: 126/69 Standing Pulse at 3 Minutes: 90  Patient lightheaded at sitting and initial standing, stated that it is improved since previous orthostatics were obtained. Patient stated that lightheadness "evened out" after standing for a couple of minutes.

## 2018-11-13 NOTE — Progress Notes (Signed)
  Echocardiogram 2D Echocardiogram has been performed.  Autumn [name] 11/13/2018, 11:13 AM

## 2018-11-14 ENCOUNTER — Other Ambulatory Visit: Payer: Medicare Other

## 2018-11-14 DIAGNOSIS — R1010 Upper abdominal pain, unspecified: Secondary | ICD-10-CM

## 2018-11-14 LAB — COMPREHENSIVE METABOLIC PANEL
ALT: 15 U/L (ref 0–44)
AST: 26 U/L (ref 15–41)
Albumin: 2.9 g/dL — ABNORMAL LOW (ref 3.5–5.0)
Alkaline Phosphatase: 35 U/L — ABNORMAL LOW (ref 38–126)
Anion gap: 8 (ref 5–15)
BUN: 12 mg/dL (ref 8–23)
CO2: 22 mmol/L (ref 22–32)
Calcium: 8.7 mg/dL — ABNORMAL LOW (ref 8.9–10.3)
Chloride: 100 mmol/L (ref 98–111)
Creatinine, Ser: 0.85 mg/dL (ref 0.44–1.00)
GFR calc Af Amer: >60 mL/min (ref >60)
GFR calc non Af Amer: >60 mL/min (ref >60)
Glucose, Bld: 107 mg/dL — ABNORMAL HIGH (ref 70–99)
Potassium: 3.7 mmol/L (ref 3.5–5.1)
Sodium: 130 mmol/L — ABNORMAL LOW (ref 135–145)
Total Bilirubin: 0.5 mg/dL (ref 0.3–1.2)
Total Protein: 5.7 g/dL — ABNORMAL LOW (ref 6.5–8.1)

## 2018-11-14 MED ORDER — LOSARTAN POTASSIUM 50 MG PO TABS
100.0000 mg | ORAL_TABLET | Freq: Every day | ORAL | Status: DC
  Administered 2018-11-14 – 2018-11-21 (×8): 100 mg via ORAL
  Filled 2018-11-14 (×9): qty 2

## 2018-11-14 MED ORDER — CARVEDILOL 12.5 MG PO TABS
12.5000 mg | ORAL_TABLET | Freq: Two times a day (BID) | ORAL | Status: DC
  Administered 2018-11-14 – 2018-11-28 (×28): 12.5 mg via ORAL
  Filled 2018-11-14 (×30): qty 1

## 2018-11-14 NOTE — Consult Note (Signed)
Consultation  Referring Provider:     Darlin Drop, DO Primary Care Physician:  Merlene Laughter, MD Primary Gastroenterologist:        Gentry Fitz  Reason for Consultation:     Abdominal pain, nausea         HPI:   Autumn [name] is a 85 y.o. female with a history of hypertension, LBBB, hypothyroidism, arthritis, heart failure, GERD, admitted on 9/29 with nausea/vomiting, lightheadedness, fatigue, dyspnea.  Admission labs notable for lactic acidosis (resolved), elevated troponin (peak 196, then downtrending) admitted with acute exacerbation of heart failure, with EF 25% now from 35% in 2016, suspected to be stress-induced cardiomyopathy in the setting of lactic acidosis and abdominal pain.  CTA on 9/30 demonstrated celiac artery stenosis (severe), with patent SMA.  Vascular surgery was consulted, but felt symptoms were inconsistent with mesenteric ischemia.  She states that has been taking ibuprofen q4 hours for months for various joint aches/arthritis.  She stopped taking about 2 weeks ago.  She does take omeprazole 20 g/daily for reflux.  She denies melena or hematochezia.  She states that she essentially only had one episode of emesis, and that her nausea is largely resolved.  She describes her abdominal pain as bilateral upper abdominal discomfort, which occurs intermittently, and not necessarily related to p.o. intake.  Pain described as a "nuisance "and is nonlimiting.  Pain is been present for months.  No previous EGD.   States that she has lost approximately 5# over the last month or so due to reduced appetite, but no sitophobia.  Today, she states that she feels much improved from admission.  She is without any abdominal pain or nausea.  Tolerating full breakfast this morning without issue.  Normal bowel habits without overt GI blood loss.  Past Medical History:  Diagnosis Date  . Anxiety   . GERD (gastroesophageal reflux disease)   . Hypertension   . Hypothyroid   . LBBB (left  bundle branch block)   . Osteopenia   . Peripheral neuropathy   . Venous insufficiency     Past Surgical History:  Procedure Laterality Date  . ABDOMINAL HYSTERECTOMY    . BLADDER SURGERY    . LEFT HEART CATHETERIZATION WITH CORONARY ANGIOGRAM N/A 09/02/2013   Procedure: LEFT HEART CATHETERIZATION WITH CORONARY ANGIOGRAM;  Surgeon: [name], MD;  Location: [facility] CATH LAB;  Service: Cardiovascular;  Laterality: N/A;  . RENAL ANGIOGRAM N/A 09/02/2013   Procedure: RENAL ANGIOGRAM;  Surgeon: [name], MD;  Location: [facility] CATH LAB;  Service: Cardiovascular;  Laterality: N/A;    Family History  Problem Relation Age of Onset  . Hypertension Mother   . Prostate cancer Father      Social History   Tobacco Use  . Smoking status: Never Smoker  . Smokeless tobacco: Never Used  Substance Use Topics  . Alcohol use: Yes    Frequency: Never    Comment: Once every three months   . Drug use: No    Prior to Admission medications   Medication Sig Start Date End Date Taking? Authorizing Provider  Ascorbic Acid (VITAMIN C PO) Take 1 tablet by mouth daily.   Yes [provider]  carvedilol (COREG) 6.25 MG tablet Take 1 tablet (6.25 mg total) by mouth 2 (two) times daily. 06/23/14  Yes [name], MD  Cholecalciferol (VITAMIN D PO) Take 1 tablet by mouth daily.   Yes [provider]  Cyanocobalamin (VITAMIN B-12 PO) Take 1 tablet by mouth daily.  Yes [provider]  diclofenac sodium (VOLTAREN) 1 % GEL Apply 4 g topically 4 (four) times daily. Patient taking differently: Apply 4 g topically 4 (four) times daily as needed (pain).  10/02/18  Yes Plunkett, Alphonzo LemmingsWhitney, MD  estradiol (ESTRACE) 1 MG tablet Take 1 mg by mouth daily.   Yes [provider]  HYDROcodone-acetaminophen (NORCO/VICODIN) 5-325 MG per tablet Take 1 tablet by mouth daily as needed for moderate pain.   Yes [provider]  levothyroxine (SYNTHROID, LEVOTHROID) 50 MCG tablet Take  50 mcg by mouth daily before breakfast.   Yes [provider]  LORazepam (ATIVAN) 0.5 MG tablet Take 0.5 mg by mouth at bedtime as needed for sleep.    Yes [provider]  losartan (COZAAR) 50 MG tablet Take 50 mg by mouth daily.   Yes [provider]  MELATONIN PO Take 1 mL by mouth at bedtime.   Yes [provider]  Multiple Vitamins-Minerals (HAIR SKIN AND NAILS FORMULA PO) Take 1 tablet by mouth daily.   Yes [provider]  omeprazole (PRILOSEC) 20 MG capsule Take 20 mg by mouth daily as needed (for reflux).   Yes [provider]  TURMERIC CURCUMIN PO Take 1 tablet by mouth daily.    Yes [provider]    Current Facility-Administered Medications  Medication Dose Route Frequency Provider Last Rate Last Dose  . acetaminophen (TYLENOL) tablet 650 mg  650 mg Oral Q6H PRN Madelyn FlavorsSmith, Rondell A, MD   650 mg at 11/14/18 0441   Or  . acetaminophen (TYLENOL) suppository 650 mg  650 mg Rectal Q6H PRN Smith, Rondell A, MD      . albuterol (PROVENTIL) (2.5 MG/3ML) 0.083% nebulizer solution 2.5 mg  2.5 mg Nebulization Q6H PRN Smith, Rondell A, MD      . alum & mag hydroxide-simeth (MAALOX/MYLANTA) 200-200-20 MG/5ML suspension 30 mL  30 mL Oral Q6H PRN Madelyn FlavorsSmith, Rondell A, MD   30 mL at 11/13/18 0030  . aspirin tablet 325 mg  325 mg Oral Daily Katrinka BlazingSmith, Rondell A, MD   325 mg at 11/14/18 1014  . carvedilol (COREG) tablet 12.5 mg  12.5 mg Oral BID Sande Rives'Neal, Jewell Thomas, MD   12.5 mg at 11/14/18 1016  . Chlorhexidine Gluconate Cloth 2 % PADS 6 each  6 each Topical Daily Clydie BraunSmith, Rondell A, MD   6 each at 11/13/18 1151  . diclofenac sodium (VOLTAREN) 1 % transdermal gel 4 g  4 g Topical QID PRN Smith, Rondell A, MD      . enoxaparin (LOVENOX) injection 40 mg  40 mg Subcutaneous Q24H Smith, Rondell A, MD   40 mg at 11/13/18 1318  . HYDROcodone-acetaminophen (NORCO/VICODIN) 5-325 MG per tablet 1 tablet  1 tablet Oral Daily PRN Madelyn FlavorsSmith, Rondell A, MD      .  levothyroxine (SYNTHROID) tablet 50 mcg  50 mcg Oral QAC breakfast Madelyn FlavorsSmith, Rondell A, MD   50 mcg at 11/14/18 [phone]  . LORazepam (ATIVAN) tablet 0.5 mg  0.5 mg Oral QHS PRN Madelyn FlavorsSmith, Rondell A, MD      . losartan (COZAAR) tablet 100 mg  100 mg Oral Daily O'Neal, Ronnald RampWesley Thomas, MD   100 mg at 11/14/18 1014  . pantoprazole (PROTONIX) injection 40 mg  40 mg Intravenous Q12H Calvert Cantorizwan, Saima, MD   40 mg at 11/14/18 1014  . sodium chloride flush (NS) 0.9 % injection 3 mL  3 mL Intravenous Q12H Smith, Rondell A, MD   3 mL at 11/14/18  1015  . vitamin B-12 (CYANOCOBALAMIN) tablet 1,000 mcg  1,000 mcg Oral Daily Tamala Julian, Rondell A, MD   1,000 mcg at 11/14/18 1014    Allergies as of 11/12/2018 - Review Complete 11/12/2018  Allergen Reaction Noted  . Hydrochlorothiazide Other (See Comments) 06/22/2013  . Ramipril Other (See Comments) 06/22/2013  . Remeron [mirtazapine] Other (See Comments) 06/22/2013  . Sulfa antibiotics Rash 11/12/2018     Review of Systems:    As per HPI, otherwise negative    Physical Exam:  Vital signs in last 24 hours: Temp:  [97.8 F (36.6 C)-98.1 F (36.7 C)] 98.1 F (36.7 C) (10/01 0500) Pulse Rate:  [73-85] 76 (10/01 0800) Resp:  [18-22] 22 (10/01 0800) BP: (126-153)/(59-90) 148/90 (10/01 0800) SpO2:  [92 %-97 %] 97 % (10/01 0900) Weight:  [65.7 kg] 65.7 kg (10/01 0500) Last BM Date: 11/11/18 General:   Pleasant female in NAD Head:  Normocephalic and atraumatic. Eyes:   No icterus.   Conjunctiva pink. Ears:  Normal auditory acuity. Neck:  Supple Lungs:  Respirations even and unlabored. Lungs clear to auscultation bilaterally.   No wheezes, crackles, or rhonchi.  Heart:  Regular rate and rhythm; no MRG Abdomen:  Soft, nondistended, nontender. Normal bowel sounds. No appreciable masses or hepatomegaly.  Rectal:  Not performed.  Msk:  Symmetrical without gross deformities.  Extremities:  Without edema. Neurologic:  Alert and  oriented x4;  grossly normal  neurologically. Skin:  Intact without significant lesions or rashes. Psych:  Alert and cooperative. Normal affect.  LAB RESULTS: Recent Labs    11/12/18 0210 11/12/18 1345 11/13/18 0009  WBC 9.6  --  8.4  HGB 12.3  --  12.5  HCT 37.0 34.5 35.2*  PLT 262  --  231   BMET Recent Labs    11/13/18 0009 11/13/18 0811 11/14/18 0304  NA 130* 129* 130*  K 3.7 3.6 3.7  CL 98 96* 100  CO2 20* 21* 22  GLUCOSE 118* 108* 107*  BUN 8 7* 12  CREATININE 0.77 0.70 0.85  CALCIUM 9.0 8.9 8.7*   LFT Recent Labs    11/14/18 0304  PROT 5.7*  ALBUMIN 2.9*  AST 26  ALT 15  ALKPHOS 35*  BILITOT 0.5   PT/INR No results for input(s): LABPROT, INR in the last 72 hours.  STUDIES: No results found.   PREVIOUS ENDOSCOPIES:            Colonoscopy approximately 5 years ago at [place].  No report in EMR for review.   Impression / Plan:   1) Upper abdominal pain 2) Nausea/vomiting 3) Reduced appetite 4) Weight loss 5) NSAID use 6) GERD  - Mild upper abdominal pain with associated nausea and a single episode of nonbloody emesis in the setting of chronic NSAID use.  Suspect mucosal etiology (i.e. PUD, gastritis, etc.).  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  Vascular Surgery consulted and feels this is less likely etiology.  Given lack of postprandial symptoms, no sitophobia, overall clinical presentation, I tend to degree. -Discussed EGD to evaluate for PUD, gastritis.  Given relative lack of symptoms currently, mild nature of symptoms, she does not want to proceed with endoscopic evaluation - Check H. pylori serology -Agree with high-dose PPI.  Currently Protonix 40 mg IV twice daily.  Okay to transition to 40 mg p.o. twice daily x6 weeks, then reduce to 40 mg daily, and will continue to titrate to lowest effective dose for ongoing reflux control - Stop NSAIDs -  Defer to primary Hospitalist service and [facility] for ongoing pain control outside of NSAID class -If return of symptoms or  other clinical concern, can certainly again follow-up for EGD  7) New onset systolic CHF/Possible stress-induced cardiomyopathy 8) Pericardial effusion 9) Chronic LBBB - Being followed by Cardiology service - As above, given her underlying comorbidities and relative paucity of symptoms recently, she does not want to proceed with EGD, which seems reasonable  GI service will sign off at this time.  However, please do not hesitate to contact me with any additional questions or concerns.    LOS: 1 day   Shellia Cleverly  11/14/2018, 10:58 AM

## 2018-11-14 NOTE — Progress Notes (Signed)
PROGRESS NOTE  Autumn [name] OVA:[id number] DOB: [birth year] DOA: 11/12/2018 PCP: Merlene Laughter, MD  HPI/Recap of past 24 hours:  Autumn [name] is a 85 y.o. female with medical history significant of hypertension, hypothyroidism,  LBBB, arthritis, and GERD; who presents with complaints of nausea and vomiting.  Yesterday, she began feeling lightheaded as though she may pass out and was unable to walk while brushing her teeth.  She is able to sit down on the toilet in the bathroom and became nauseated and vomited.  Associated symptoms include complaints of generalized malaise, shortness of breath, achy neck, and achy back pain.  She reports that the back and neck pain complaints are not new and related to her history of arthritis.  She has not had any significant cough, fever, headache, change in vision, focal weakness, dysuria, or change in bowel habits.  ED Course: Upon admission into the emergency department patient was seen to be afebrile, pulse 80-106, respirations 15-30, blood pressures 137/94-172/74, and O2 saturation maintained on room air.  Orthostatic vital signs were noted to be positive after standing for 3 minutes. Labs significant for MCV 103.4, MCH 34.4, sodium 131, chloride 97, CO2 18, BUN 21, creatinine 1.01, anion gap 16, lactic acid 4.6->3.8, and troponin 42->76. CXR showed no acute abnormality and UA negative for any signs of infection.  Blood cultures were ordered.  She was started on normal saline IV fluids at 100 mL/h.  She has had no more nausea or vomiting since admission, but still does not feel well.  11/14/18: Patient was seen and examined at bedside this morning.  She denies nausea however she continues to have abdominal discomfort. States up to 2 weeks ago was taking ibuprofen daily for months. Also reports unintentional weight loss 5 lbs in the last 4-8 weeks. Reports colonoscopy 5 years ago at [facility], no records found).  No prior endoscopy. 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 Seen by vascular surgery, unlikely reason for symptoms. Self-reported daily use of NSAID, ibuprofen for months and 5 pound unintentional weight loss the past 4 to 8 weeks GI consulted to further assess  Acute on chronic combined systolic and diastolic CHF Seen by Dr. Antoine Poche, per review of medical records on 04/10/2014 she had an ejection fraction of 35%, had a left heart cath in September 02, 2013 with history of nonischemic cardiomyopathy 2D echo done on 11/13/2018 showed LVEF 25% with diffuse hypokinesis and septal lateral dyssynchrony consistent with LBBB as well as grade 1 diastolic dysfunction Seen by cardiology Continue medication as recommended by cardiology  Orthostatic hypotension Antihypertensives on hold per cardiology Received IV fluid bolus Caution on IV fluids due to low LVEF 25% Defer to cardiology to resume antihypertensives Continue to closely monitor vital signs  Small to moderate pericardial effusion no sign of tamponade Management per cardiology  Elevated troponin, likely demand ischemia High-sensitivity troponin peaked at 196 and trended down Seen by cardiology, no plan for further ischemic work-up  Resolved lactic acidosis, suspect secondary to poor perfusion with acute dehydration No sign of active infective process Presented with  lactic acid 4.6 on 11/12/2018, improved post IV fluid hydration Lactic acid 1.7 on 11/13/2018   Chronic euvolemic hyponatremia Appears to have had hyponatremia since 2018 Presented with sodium of 129 Sodium 130  on 11/14/2018  Methicillin susceptible coagulase-negative staph bacteremia, likely contaminant Continue to follow cultures daily  Urinary retention with bilateral hydronephrosis Foley catheter placed less than 1 day  Hypothyroidism Continue levothyroxine  GERD Continue Protonix Periodically monitor QTC with twelve-lead EKG while on PPI  Code Status: full Family Communication: patient   Disposition Plan:  Patient is currently not appropriate for discharge at this time due to persistent symptomatology needing further evaluation by GI.  Possible discharge to home when cardiology and GI signed off.   Consultants:  Cardiology  Procedures:  echo   Objective: Vitals:   11/13/18 1412 11/13/18 1939 11/14/18 0435 11/14/18 0500  BP: (!) 126/59 (!) 153/85 (!) 152/75   Pulse: 81 85  73  Resp: 18 18  18   Temp: 98.1 F (36.7 C) 97.8 F (36.6 C)  98.1 F (36.7 C)  TempSrc: Oral Oral  Oral  SpO2: 95% 92%  97%  Weight:    65.7 kg    Intake/Output Summary (Last 24 hours) at 11/14/2018 [id number] Last data filed at 11/14/2018 0600 Gross per 24 hour  Intake 620 ml  Output 1150 ml  Net -530 ml   Filed Weights   11/14/18 0500  Weight: 65.7 kg    Exam:   General: 85 y.o. year-old female well developed well nourished appears uncomfortable due to abdominal discomfort..  Alert and oriented x3.  Cardiovascular: Regular rate and rhythm with no rubs or gallops.  No thyromegaly or JVD noted.    Respiratory: Clear to auscultation with no wheezes or rales. Good inspiratory effort.  Abdomen: Soft nontender nondistended with hypoactive bowel sounds.  Musculoskeletal: Trace lower extremity edema. 2/4 pulses in all 4 extremities.  Psychiatry: Mood is appropriate for condition and setting   Data Reviewed: CBC: Recent Labs  Lab 11/12/18 0210 11/12/18 1345 11/13/18 0009  WBC 9.6  --  8.4  NEUTROABS 6.2  --   --   HGB 12.3  --  12.5  HCT 37.0 34.5 35.2*  MCV 103.4*  --  99.7    PLT 262  --  231   Basic Metabolic Panel: Recent Labs  Lab 11/12/18 0210 11/13/18 0009 11/13/18 0811 11/14/18 0304  NA 131* 130* 129* 130*  K 4.5 3.7 3.6 3.7  CL 97* 98 96* 100  CO2 18* 20* 21* 22  GLUCOSE 156* 118* 108* 107*  BUN 21 8 7* 12  CREATININE 1.01* 0.77 0.70 0.85  CALCIUM 9.4 9.0 8.9 8.7*   GFR: Estimated Creatinine Clearance: 44.9 mL/min (by C-G formula based on SCr of 0.85 mg/dL). Liver Function Tests: Recent Labs  Lab 11/12/18 0210 11/14/18 0304  AST 26 26  ALT 16 15  ALKPHOS 42 35*  BILITOT 0.6 0.5  PROT 7.1 5.7*  ALBUMIN 3.9 2.9*   Recent Labs  Lab 11/12/18 0210  LIPASE 25   No results for input(s): AMMONIA in the last 168 hours. Coagulation Profile: No results for input(s): INR, PROTIME in the last 168 hours. Cardiac Enzymes: No results for input(s): CKTOTAL, CKMB, CKMBINDEX, TROPONINI in the last 168 hours. BNP (last 3 results) No results for input(s): PROBNP in the last 8760 hours. HbA1C: No results for input(s): HGBA1C in the last 72 hours. CBG: Recent Labs  Lab 11/12/18 0208  GLUCAP 145*   Lipid Profile: No  results for input(s): CHOL, HDL, LDLCALC, TRIG, CHOLHDL, LDLDIRECT in the last 72 hours. Thyroid Function Tests: Recent Labs    11/12/18 1300  TSH 0.778   Anemia Panel: Recent Labs    11/12/18 1300  VITAMINB12 931*   Urine analysis:    Component Value Date/Time   COLORURINE YELLOW 11/12/2018 0222   APPEARANCEUR CLEAR 11/12/2018 0222   LABSPEC 1.012 11/12/2018 0222   PHURINE 5.0 11/12/2018 0222   GLUCOSEU NEGATIVE 11/12/2018 0222   HGBUR NEGATIVE 11/12/2018 0222   BILIRUBINUR NEGATIVE 11/12/2018 0222   KETONESUR 20 (A) 11/12/2018 0222   PROTEINUR NEGATIVE 11/12/2018 0222   NITRITE NEGATIVE 11/12/2018 0222   LEUKOCYTESUR NEGATIVE 11/12/2018 0222   Sepsis Labs: @LABRCNTIP (procalcitonin:4,lacticidven:4)  ) Recent Results (from the past 240 hour(s))  Culture, blood (routine x 2)     Status: None (Preliminary  result)   Collection Time: 11/12/18  2:10 AM   Specimen: BLOOD  Result Value Ref Range Status   Specimen Description   Final    BLOOD RIGHT ANTECUBITAL Performed at [facility], [address], [place], [place]    Special Requests   Final    BOTTLES DRAWN AEROBIC AND ANAEROBIC Blood Culture adequate volume Performed at [facility], [address] [place], [place]    Culture  Setup Time   Final    GRAM POSITIVE COCCI IN BOTH AEROBIC AND ANAEROBIC BOTTLES Organism ID to follow CRITICAL RESULT CALLED TO, READ BACK BY AND VERIFIED WITH: [id number] Verdia Kuba [id number] MLM Performed at [facility], [address] [place], [place]    Culture GRAM POSITIVE COCCI  Final   Report Status PENDING  Incomplete  Blood Culture ID Panel (Reflexed)     Status: Abnormal   Collection Time: 11/12/18  2:10 AM  Result Value Ref Range Status   Enterococcus species NOT DETECTED NOT DETECTED Final   Listeria monocytogenes NOT DETECTED NOT DETECTED Final   Staphylococcus species DETECTED (A) NOT DETECTED Final    Comment: Methicillin (oxacillin) susceptible coagulase negative staphylococcus. Possible blood culture contaminant (unless isolated from more than one blood culture draw or clinical case suggests pathogenicity). No antibiotic treatment is indicated for blood  culture contaminants. CRITICAL RESULT CALLED TO, READ BACK BY AND VERIFIED WITH: PHARMD T BAUMEISTER 11/14/18 0803 MLM    Staphylococcus aureus (BCID) NOT DETECTED NOT DETECTED Final   Methicillin resistance NOT DETECTED NOT DETECTED Final   Streptococcus species NOT DETECTED NOT DETECTED Final   Streptococcus agalactiae NOT DETECTED NOT DETECTED Final   Streptococcus pneumoniae NOT DETECTED NOT DETECTED Final   Streptococcus pyogenes NOT DETECTED NOT DETECTED Final   Acinetobacter baumannii NOT DETECTED NOT DETECTED Final   Enterobacteriaceae species NOT DETECTED NOT DETECTED  Final   Enterobacter cloacae complex NOT DETECTED NOT DETECTED Final   Escherichia coli NOT DETECTED NOT DETECTED Final   Klebsiella oxytoca NOT DETECTED NOT DETECTED Final   Klebsiella pneumoniae NOT DETECTED NOT DETECTED Final   Proteus species NOT DETECTED NOT DETECTED Final   Serratia marcescens NOT DETECTED NOT DETECTED Final   Haemophilus influenzae NOT DETECTED NOT DETECTED Final   Neisseria meningitidis NOT DETECTED NOT DETECTED Final   Pseudomonas aeruginosa NOT DETECTED NOT DETECTED Final   Candida albicans NOT DETECTED NOT DETECTED Final   Candida glabrata NOT DETECTED NOT DETECTED Final   Candida krusei NOT DETECTED NOT DETECTED Final   Candida parapsilosis NOT DETECTED NOT DETECTED Final   Candida tropicalis NOT DETECTED NOT  DETECTED Final    Comment: Performed at [facility], [place] [address], [place], [place] [address]  SARS CORONAVIRUS 2 (TAT 6-24 HRS) Nasopharyngeal Nasopharyngeal Swab     Status: None   Collection Time: 11/12/18  5:47 AM   Specimen: Nasopharyngeal Swab  Result Value Ref Range Status   SARS Coronavirus 2 NEGATIVE NEGATIVE Final    Comment: (NOTE) SARS-CoV-2 target nucleic acids are NOT DETECTED. The SARS-CoV-2 RNA is generally detectable in upper and lower respiratory specimens during the acute phase of infection. Negative results do not preclude SARS-CoV-2 infection, do not rule out co-infections with other pathogens, and should not be used as the sole basis for treatment or other patient management decisions. Negative results must be combined with clinical observations, patient history, and epidemiological information. The expected result is Negative. Fact Sheet for Patients: SugarRoll.be Fact Sheet for Healthcare Providers: https://www.woods-mathews.com/ This test is not yet approved or cleared by the [place] FDA and  has been authorized for detection and/or diagnosis of SARS-CoV-2 by FDA  under an Emergency Use Authorization (EUA). This EUA will remain  in effect (meaning this test can be used) for the duration of the COVID-19 declaration under Section 56 4(b)(1) of the Act, 21 U.S.C. section 360bbb-3(b)(1), unless the authorization is terminated or revoked sooner. Performed at [facility], [place] [address] [place], [place] [address]   Culture, blood (routine x 2)     Status: None (Preliminary result)   Collection Time: 11/12/18  7:02 PM   Specimen: BLOOD LEFT HAND  Result Value Ref Range Status   Specimen Description BLOOD LEFT HAND  Final   Special Requests   Final    BOTTLES DRAWN AEROBIC ONLY Blood Culture adequate volume   Culture   Final    NO GROWTH 2 DAYS Performed at [facility], [place] [address], [place], [place] [address]    Report Status PENDING  Incomplete      Studies: No results found.  Scheduled Meds:  aspirin  325 mg Oral Daily   carvedilol  12.5 mg Oral BID   Chlorhexidine Gluconate Cloth  6 each Topical Daily   enoxaparin (LOVENOX) injection  40 mg Subcutaneous Q24H   levothyroxine  50 mcg Oral QAC breakfast   losartan  100 mg Oral Daily   pantoprazole (PROTONIX) IV  40 mg Intravenous Q12H   sodium chloride flush  3 mL Intravenous Q12H   vitamin B-12  1,000 mcg Oral Daily    Continuous Infusions:   LOS: 1 day     Kayleen Memos, MD [facility] Pager [phone]  If 7PM-7AM, please contact night-coverage [email or web address] Password [email or web address] 11/14/2018, 9:05 AM

## 2018-11-14 NOTE — Progress Notes (Addendum)
Progress Note  Patient Name: Autumn [name] Date of Encounter: 11/14/2018  Primary Cardiologist: [name], MD   Subjective   No acute overnight events. Patient looks better today but states she feels about the same. However, she denies any more nausea or abdominal pain this morning. She states she feels "funny" in her head. When asked to elaborate, she notes stiffness in her neck but states this is not a new problem for her. No chest pain, shortness of breath, palpitations, dizziness, or syncope.   Inpatient Medications    Scheduled Meds: . aspirin  325 mg Oral Daily  . carvedilol  12.5 mg Oral BID  . Chlorhexidine Gluconate Cloth  6 each Topical Daily  . enoxaparin (LOVENOX) injection  40 mg Subcutaneous Q24H  . levothyroxine  50 mcg Oral QAC breakfast  . losartan  100 mg Oral Daily  . pantoprazole (PROTONIX) IV  40 mg Intravenous Q12H  . sodium chloride flush  3 mL Intravenous Q12H  . vitamin B-12  1,000 mcg Oral Daily   Continuous Infusions:  PRN Meds: acetaminophen **OR** acetaminophen, albuterol, alum & mag hydroxide-simeth, diclofenac sodium, HYDROcodone-acetaminophen, LORazepam   Vital Signs    Vitals:   11/13/18 1412 11/13/18 1939 11/14/18 0435 11/14/18 0500  BP: (!) 126/59 (!) 153/85 (!) 152/75   Pulse: 81 85  73  Resp: 18 18  18   Temp: 98.1 F (36.7 C) 97.8 F (36.6 C)  98.1 F (36.7 C)  TempSrc: Oral Oral  Oral  SpO2: 95% 92%  97%  Weight:    65.7 kg    Intake/Output Summary (Last 24 hours) at 11/14/2018 0832 Last data filed at 11/14/2018 0600 Gross per 24 hour  Intake 620 ml  Output 1150 ml  Net -530 ml   Last 3 Weights 11/14/2018 10/02/2018 01/30/2017  Weight (lbs) 144 lb 12.8 oz 150 lb 154 lb  Weight (kg) 65.681 kg 68.04 kg 69.854 kg      Telemetry    Normal sinus rhythm with rates in the 70's to low 100's. - Personally Reviewed  ECG    No new ECG tracing today. - Personally Reviewed  Physical Exam   GEN: 85 year old Caucasian  female sitting comfortably in hospital chair in no acute distress.  Neck: Supple. No JVD. Cardiac: RRR. No murmurs, rubs, or gallops. Radial pulses 2+ and equal bilaterally. Respiratory: No increased work of breathing. Clear to auscultation bilaterally. No wheezes, rhonchi, or rales. GI: Soft, non-distended, and non-tender. Bowel sounds present. MS: No lower extremity edema. No deformities. Skin: Warm and dry. Neuro:  No focal deficits. Psych: Normal affect. Responds appropriately.  Labs    High Sensitivity Troponin:   Recent Labs  Lab 11/12/18 0210 11/12/18 0425 11/12/18 1230 11/12/18 1902 11/13/18 0009  TROPONINIHS 42* 76* 196* 185* 156*      Chemistry Recent Labs  Lab 11/12/18 0210 11/13/18 0009 11/13/18 0811 11/14/18 0304  NA 131* 130* 129* 130*  K 4.5 3.7 3.6 3.7  CL 97* 98 96* 100  CO2 18* 20* 21* 22  GLUCOSE 156* 118* 108* 107*  BUN 21 8 7* 12  CREATININE 1.01* 0.77 0.70 0.85  CALCIUM 9.4 9.0 8.9 8.7*  PROT 7.1  --   --  5.7*  ALBUMIN 3.9  --   --  2.9*  AST 26  --   --  26  ALT 16  --   --  15  ALKPHOS 42  --   --  35*  BILITOT 0.6  --   --  0.5  GFRNONAA 51* >60 >60 >60  GFRAA 59* >60 >60 >60  ANIONGAP 16* 12 12 8      Hematology Recent Labs  Lab 11/12/18 0210 11/12/18 1345 11/13/18 0009  WBC 9.6  --  8.4  RBC 3.58*  --  3.53*  HGB 12.3  --  12.5  HCT 37.0 34.5 35.2*  MCV 103.4*  --  99.7  MCH 34.4*  --  35.4*  MCHC 33.2  --  35.5  RDW 12.5  --  12.8  PLT 262  --  231    BNP Recent Labs  Lab 11/12/18 0210  BNP 65.8     DDimer No results for input(s): DDIMER in the last 168 hours.   Radiology    No results found.  Cardiac Studies   Cardiac Catheterization 09/02/2013: Coronary angiography:  - Coronary dominance: Right - Left mainstem:   NA - Left anterior descending (LAD):   Anomalous origin off the RCA.  Normal otherwise.   - Left circumflex (LCx):  Essentially a very large branching RI normal throughout its course - Right  coronary artery (RCA):  Very large.  Mild proximal luminal irregularities.  PDA large and normal.  PL moderate sized and normal.  LAD arises from the proximal RCA. - Left ventriculography: Left ventricular systolic function is mildly globally reduced, LVEF is estimated at 45, there is no significant mitral regurgitation  - Left renal artery:  Two left renal arteries both widely patent.  Right renal artery:  Mild fibromuscular dysplasia.    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  Mild global LV dysfunction.    Recommendations:   Continue medical management.   _______________  Echocardiogram 11/13/2018: Impressions: 1. Left ventricular ejection fraction, by visual estimation, is 25%. The left ventricle has severely decreased function. Normal left ventricular size. There is no left ventricular hypertrophy. Severe diffuse hypokinesis with septal-lateral dyssynchrony  consistent with LBBB.  2. Left ventricular diastolic Doppler parameters are consistent with impaired relaxation pattern of LV diastolic filling.  3. Global right ventricle has normal systolic function.The right ventricular size is normal. No increase in right ventricular wall thickness.  4. The tricuspid valve is normal in structure. Tricuspid valve regurgitation is trivial.  5. The aortic valve is tricuspid Aortic valve regurgitation was not visualized by color flow Doppler. Mild aortic valve sclerosis without stenosis.  6. The mitral valve is normal in structure. No evidence of mitral valve regurgitation. No evidence of mitral stenosis.  7. Left atrial size was normal.  8. Right atrial size was normal.  9. The inferior vena cava is normal in size with greater than 50% respiratory variability, suggesting right atrial pressure of 3 mmHg. 10. The tricuspid regurgitant velocity is 2.40 m/s, and with an assumed right atrial pressure of 3 mmHg, the estimated right ventricular systolic pressure  is normal at 26.0 mmHg. 11. Small to moderate pericardial effusion primarily adjacent to the RV, no tamponade.  Patient Profile   Autumn [name] is a 85 y.o. female with a history of normal coronaries on cardiac catheterization in 2015, non-ischemic cardiomyopathy with improved EF of 55-60% on last Echo in 2016, LBBB, hypertension, hypothyroidism, GERD, and anxiety who is being seen today for evaluation of elevated troponin in the setting of acute vomiting at the request of Dr. Tamala Julian.   Assessment & Plan    Elevated Troponin  - High-sensitivity troponin mild elevated at 42 >> 76 >> 196 >> 185 >> 156. - EKG showed  normal sinus rhythm with chronic LBBB but no acute changes from prior tracings. - CTA negative for PE or dissection. - Echo from yesterday showed LVEF of 25% with diffuse hypokinesis and septal-lateral dyssynchrony consistent with LBBB as well as grade 1 diastolic dysfunction. - Patient has had not angina. - Felt to be due to stress-induced cardiomyopathy at this time in the setting of lactic acidosis and severe abdominal pain. No ischemic work-up planned at this time.  New Onset Systolic CHF without Decompensation/ Possible Stress-Induced Cardiomyopathy - Echo showed LVEF of 25% diffuse hypokinesis and septal-lateral dyssynchrony consistent with LBBB as well as grade 1 diastolic dysfunction. EF down from 55-60% in 2016. Felt to be due to stress-induced cardiomyopathy at this time in the setting of lactic acidosis and severe abdominal pain.  - BNP normal at 65.8.  - Patient appears euvolemic on exam and does not require diuresis at this time.  - Coreg increased from 6.25mg  twice daily to 12.5mg  twice daily today. Can continue to uptitrate if BP and heart rate allow. - Losartan increased from 50mg  daily to 100mg  daily today.  - Continue to monitor volume status closely.  - Will need repeat Echo in about 3 months. If EF still down at that time, may need to consider ischemic evaluation.    Pericardial Effusion - Echo showed small to moderate pericardial effusion primarily adjacent to the RV. No signs of tamponade. Patient hemodynamically stable.  - Will need repeat Echo at some point to re-evaluate this. Will discuss timing of this with MD.   Chronic LBBB - Going back to at least 2015 based on EKGs in our system.  Hypertension - BP mildly elevated at 152/75 this morning before medications have been given. - Continue Coreg and Losartan as above.  Weakness Vomiting/Nausea - Patient presented from [facility] for acute onset of weakness and nausea/vomiting which resolved on arrival to ED. - Patient afebrile and WBC normal. - Lipase normal. - Lactic acidosis 4.6 >> 3.8 >> 4.3 >> 3.5 >> 1.7. - Blood cultures negative so far. - COVID negative. - Vascular surgery was consulted yesterday due to concern for mesenteric ischemia with high-grade stenosis of celiac artery noted on CTA. Dr. saw patient and does not thinks symptoms are consistent with mesenteric ischemia at this time. Recommended continued work-up but said if symptoms do not improve could consider mesenteric angiogram with possible intervention of celiac artery. - Management per primary team.  Orthostatic Hypotension - Orthostatics positive yesterday with drop in BP from 126/59 when lying to 86/33 when standing with lightheadedness upon standing. Patient received 250 bolus of normal saline. Repeat orthostatics still positive but improved with systolic BP dropping from 140 when lying to 120 when standing with improvement in symptoms. She received another bolus of normal saline last night. - Repeat orthostatics today.  - Will need to be careful increasing antihypertensive if orthostasis continues.   Renal Insufficiency - Creatinine minimally elevated at 1.01 on presentation but improved with fluids.   For questions or updates, please contact [facility] Please consult [email or web address] for  contact info under        Signed, [facility], PA-C  11/14/2018, 8:32 AM

## 2018-11-14 NOTE — Progress Notes (Signed)
Physical Therapy Treatment Patient Details Name: Autumn [name] MRN: [id number] DOB: [birth year] Today's Date: 11/14/2018    History of Present Illness 85 y.o. female with past medical history significant for HTN, LBBB, and GERD who presents to the ED via EMS from [facility] center for acute onset neck and back pain as well as vomiting. In ED became orthostatic with 3 minutes of standing and found to have elevated troponing levels. Admitted for observation 9/29.    PT Comments    Pt found lying flat in bed on entry agreeable to try to get to chair with therapy. Pt continues to be limited by orthostatic response to positional change in presence of generalized weakness. Pt also reports difficulty with her speech both in terms of word finding and speech production. Pt is supervision for bed mobility, and min A for transfers and ambulation of 2 feet with RW. If pt is unable to progress ambulation may need to reassess need for SNF level rehab at d/c.  PT will continue to follow acutely.  Orthostatic BPs                                                            BP                        HR Supine 149/80 75 bpm  Sitting 164/63 88 bpm  Sitting after 3 min 146/79 80 bpm  Standing (could not tolerate and sat as soon as BP finished 104/64 97 bpm  Sitting after 3 min  127/76 81 bpm  Standing after 6 min 132/75 85 bpm      Follow Up Recommendations  Home health PT;Supervision - Intermittent     Equipment Recommendations  Rolling walker with 5" wheels       Precautions / Restrictions Precautions Precautions: Fall Precaution Comments: syncope  Restrictions Weight Bearing Restrictions: No    Mobility  Bed Mobility Overal bed mobility: Needs Assistance Bed Mobility: Supine to Sit;Sit to Supine     Supine to sit: Supervision Sit to supine: Supervision   General bed mobility comments: increased time and effort as well as use of bedrails  Transfers Overall transfer level:  Needs assistance Equipment used: 1 person hand held assist Transfers: Sit to/from Stand Sit to Stand: Min assist         General transfer comment: minA for steadying. Pt able to power up but reaches for therapist to steady  Ambulation/Gait Ambulation/Gait assistance: Min assist Gait Distance (Feet): 2 Feet Assistive device: 1 person hand held assist Gait Pattern/deviations: Step-to pattern;Decreased step length - right;Decreased step length - left;Shuffle Gait velocity: slowed Gait velocity interpretation: <1.31 ft/sec, indicative of household ambulator General Gait Details: minA for slow, shuffling steps from bed to recliner       Balance Overall balance assessment: Needs assistance Sitting-balance support: Feet supported;No upper extremity supported;Single extremity supported;Bilateral upper extremity supported Sitting balance-Leahy Scale: Fair     Standing balance support: Bilateral upper extremity supported Standing balance-Leahy Scale: Poor Standing balance comment: requires B UE support on therapist to maintain balance                            Cognition Arousal/Alertness: Awake/alert Behavior During Therapy:  WFL for tasks assessed/performed;Flat affect Overall Cognitive Status: Impaired/Different from baseline                                 General Comments: pt reports so word finding difficulty since coming to hospital          General Comments General comments (skin integrity, edema, etc.): Continued orthostatic response to postional change, pt reports difficult with speech and word finding, speech is slowed but phonation is intelligible       Pertinent Vitals/Pain Pain Assessment: 0-10 Pain Score: 2  Pain Location: back/neck R LE stiffness  Pain Descriptors / Indicators: Discomfort;Tightness Pain Intervention(s): Limited activity within patient's tolerance;Monitored during session;Repositioned           PT Goals (current  goals can now be found in the care plan section) Acute Rehab PT Goals Patient Stated Goal: get some sleep PT Goal Formulation: With patient Time For Goal Achievement: 11/27/18 Potential to Achieve Goals: Good Progress towards PT goals: Progressing toward goals    Frequency    Min 3X/week      PT Plan Current plan remains appropriate       AM-PAC PT "6 Clicks" Mobility   Outcome Measure  Help needed turning from your back to your side while in a flat bed without using bedrails?: None Help needed moving from lying on your back to sitting on the side of a flat bed without using bedrails?: None Help needed moving to and from a bed to a chair (including a wheelchair)?: A Little Help needed standing up from a chair using your arms (e.g., wheelchair or bedside chair)?: A Little Help needed to walk in hospital room?: A Lot Help needed climbing 3-5 steps with a railing? : A Lot 6 Click Score: 18    End of Session Equipment Utilized During Treatment: Gait belt Activity Tolerance: Other (comment)(limited by dizziness) Patient left: with call bell/phone within reach;in chair;with chair alarm set Nurse Communication: Mobility status;Other (comment)(orthostatics) PT Visit Diagnosis: Muscle weakness (generalized) (M62.81);Dizziness and giddiness (R42)     Time: 6295-2841 PT Time Calculation (min) (ACUTE ONLY): 36 min  Charges:  $Gait Training: 8-22 mins $Therapeutic Activity: 8-22 mins                     Jashun Puertas B. Beverely Risen PT, DPT Acute Rehabilitation Services Pager [phone] Office [phone]    Elon Alas Fleet 11/14/2018, 10:07 AM

## 2018-11-15 ENCOUNTER — Other Ambulatory Visit: Payer: Self-pay

## 2018-11-15 DIAGNOSIS — R778 Other specified abnormalities of plasma proteins: Secondary | ICD-10-CM

## 2018-11-15 LAB — BASIC METABOLIC PANEL
Anion gap: 11 (ref 5–15)
BUN: 14 mg/dL (ref 8–23)
CO2: 22 mmol/L (ref 22–32)
Calcium: 8.9 mg/dL (ref 8.9–10.3)
Chloride: 97 mmol/L — ABNORMAL LOW (ref 98–111)
Creatinine, Ser: 0.75 mg/dL (ref 0.44–1.00)
GFR calc Af Amer: >60 mL/min (ref >60)
GFR calc non Af Amer: >60 mL/min (ref >60)
Glucose, Bld: 107 mg/dL — ABNORMAL HIGH (ref 70–99)
Potassium: 3.7 mmol/L (ref 3.5–5.1)
Sodium: 130 mmol/L — ABNORMAL LOW (ref 135–145)

## 2018-11-15 LAB — CULTURE, BLOOD (ROUTINE X 2): Special Requests: ADEQUATE

## 2018-11-15 MED ORDER — ASPIRIN 325 MG PO TABS: 325.0000 mg | ORAL_TABLET | Freq: Every day | ORAL | 0 refills | Status: DC

## 2018-11-15 MED ORDER — NOREPINEPHRINE 4 MG/250ML-% IV SOLN: 0.0000 ug/min | INTRAVENOUS | Status: DC

## 2018-11-15 MED ORDER — SODIUM CHLORIDE 0.9 % IV SOLN
INTRAVENOUS | Status: AC
  Administered 2018-11-15: 13:00:00 via INTRAVENOUS

## 2018-11-15 MED ORDER — SODIUM CHLORIDE 0.9 % IV BOLUS: 1000.0000 mL | Freq: Once | INTRAVENOUS | Status: DC

## 2018-11-15 MED ORDER — LORAZEPAM 0.5 MG PO TABS: 0.5000 mg | ORAL_TABLET | Freq: Every evening | ORAL | 0 refills | Status: DC | PRN

## 2018-11-15 MED ORDER — PANTOPRAZOLE SODIUM 40 MG PO TBEC
40.0000 mg | DELAYED_RELEASE_TABLET | Freq: Two times a day (BID) | ORAL | Status: DC
  Administered 2018-11-15 – 2018-11-28 (×27): 40 mg via ORAL
  Filled 2018-11-15 (×27): qty 1

## 2018-11-15 MED ORDER — GLUCAGON HCL RDNA (DIAGNOSTIC) 1 MG IJ SOLR: 5.0000 mg | Freq: Once | INTRAVENOUS | Status: DC

## 2018-11-15 MED ORDER — ENSURE ENLIVE PO LIQD
237.0000 mL | Freq: Two times a day (BID) | ORAL | Status: DC
  Administered 2018-11-17 – 2018-11-28 (×20): 237 mL via ORAL

## 2018-11-15 MED ORDER — LOSARTAN POTASSIUM 100 MG PO TABS: 100.0000 mg | ORAL_TABLET | Freq: Every day | ORAL | 0 refills | Status: DC

## 2018-11-15 MED ORDER — PANTOPRAZOLE SODIUM 40 MG PO TBEC: 40.0000 mg | DELAYED_RELEASE_TABLET | Freq: Two times a day (BID) | ORAL | 0 refills | Status: DC

## 2018-11-15 MED ORDER — CARVEDILOL 12.5 MG PO TABS: 12.5000 mg | ORAL_TABLET | Freq: Two times a day (BID) | ORAL | 0 refills | Status: DC

## 2018-11-15 NOTE — Significant Event (Signed)
Was asked to assess Autumn [name] earlier this afternoon where she became bradycardic and hypotensive after working with physical therapy.  There was a period of unresponsiveness reported per nursing.  Upon my arrival to the room, Autumn [name] was awake responsive and follow questions appropriately.  I personally reviewed her telemetry during the episode and her heart rate decreased to 50 bpm, but was in normal sinus rhythm without any evidence of advanced high-grade AV block or conduction disease.  There appears to be no evidence of beta-blocker toxicity or any other explanation then due to a vasovagal event.  Given that her telemetry was relatively normal during this episode, I would not recommend any further work-up.  I think it is reasonable to watch her overnight on telemetry to ensure she does not have another event.  If she is without any symptoms and remained stable overnight, she can be discharged tomorrow.  She remains without any significant cardiac symptoms and is doing quite well on her current medications.  [place] T. Audie Box, [place]  [address], [place], [place] [address] [phone]  5:18 PM

## 2018-11-15 NOTE — Progress Notes (Signed)
Updated patient's son [name] via phone, all questions answered to his satisfaction.

## 2018-11-15 NOTE — NC FL2 (Signed)
[place] LEVEL OF CARE SCREENING TOOL     IDENTIFICATION  Patient Name: Autumn [name] Birthdate: 08-09-1932 Sex: female Admission Date (Current Location): 11/12/2018  [facility] and [place] Number:  Herbalist and Address:  [facility] [place] [address], [place],  [address]      Provider Number: [id number]  Attending Physician Name and Address:  Kayleen Memos, DO  Relative Name and Phone Number:       Current Level of Care: Hospital Recommended Level of Care: [place] Prior Approval Number:    Date Approved/Denied:   PASRR Number: [id number]  Discharge Plan:      Current Diagnoses: Patient Active Problem List   Diagnosis Date Noted  . Pain of upper abdomen   . Postural dizziness with near syncope 11/13/2018  . Tachypnea   . Nausea and vomiting 11/12/2018  . Bilateral hydronephrosis 11/12/2018  . Elevated troponin 11/12/2018  . Lactic acidosis 11/12/2018  . Chronic neck and back pain 11/12/2018  . Essential hypertension 11/12/2018  . Normal coronary arteries 09/19/2013  . LBBB (left bundle branch block) 09/19/2013  . Congestive dilated cardiomyopathy ([place]) 07/24/2013    Orientation RESPIRATION BLADDER Height & Weight     Self, Time, Situation, Place  Normal External catheter, Incontinent(placed 9/20) Weight: 146 lb 6.2 oz (66.4 kg) Height:     BEHAVIORAL SYMPTOMS/MOOD NEUROLOGICAL BOWEL NUTRITION STATUS      Continent Diet(DYS 3)  AMBULATORY STATUS COMMUNICATION OF NEEDS Skin   Limited Assist Verbally Normal                       Personal Care Assistance Level of Assistance  Bathing, Dressing, Feeding Bathing Assistance: Limited assistance Feeding assistance: Independent Dressing Assistance: Limited assistance     Functional Limitations Info  Sight, Speech, Hearing Sight Info: Adequate Hearing Info: Adequate Speech Info: Adequate    SPECIAL CARE FACTORS FREQUENCY  PT (By  licensed PT), OT (By licensed OT)     PT Frequency: 5x OT Frequency: 5x            Contractures Contractures Info: Not present    Additional Factors Info  Code Status, Allergies Code Status Info: DNR Allergies Info: Hydrochlorothiazide, Ramipril, Remeron (Mirtazapine), Sulfa Antibiotics           Current Medications (11/15/2018):  This is the current hospital active medication list Current Facility-Administered Medications  Medication Dose Route Frequency Provider Last Rate Last Dose  . acetaminophen (TYLENOL) tablet 650 mg  650 mg Oral Q6H PRN Fuller Plan A, MD   650 mg at 11/14/18 0441   Or  . acetaminophen (TYLENOL) suppository 650 mg  650 mg Rectal Q6H PRN Smith, Rondell A, MD      . albuterol (PROVENTIL) (2.5 MG/3ML) 0.083% nebulizer solution 2.5 mg  2.5 mg Nebulization Q6H PRN Smith, Rondell A, MD      . alum & mag hydroxide-simeth (MAALOX/MYLANTA) 200-200-20 MG/5ML suspension 30 mL  30 mL Oral Q6H PRN Fuller Plan A, MD   30 mL at 11/13/18 0030  . aspirin tablet 325 mg  325 mg Oral Daily Tamala Julian, Rondell A, MD   325 mg at 11/15/18 1007  . carvedilol (COREG) tablet 12.5 mg  12.5 mg Oral BID Geralynn Rile, MD   12.5 mg at 11/15/18 1007  . Chlorhexidine Gluconate Cloth 2 % PADS 6 each  6 each Topical Daily Norval Morton, MD   6 each at  11/13/18 1151  . diclofenac sodium (VOLTAREN) 1 % transdermal gel 4 g  4 g Topical QID PRN Smith, Rondell A, MD      . enoxaparin (LOVENOX) injection 40 mg  40 mg Subcutaneous Q24H Smith, Rondell A, MD   40 mg at 11/14/18 1132  . HYDROcodone-acetaminophen (NORCO/VICODIN) 5-325 MG per tablet 1 tablet  1 tablet Oral Daily PRN Madelyn Flavors A, MD      . levothyroxine (SYNTHROID) tablet 50 mcg  50 mcg Oral QAC breakfast Madelyn Flavors A, MD   50 mcg at 11/15/18 0616  . LORazepam (ATIVAN) tablet 0.5 mg  0.5 mg Oral QHS PRN Madelyn Flavors A, MD      . losartan (COZAAR) tablet 100 mg  100 mg Oral Daily O'Neal, Ronnald Ramp, MD   100 mg  at 11/15/18 1007  . pantoprazole (PROTONIX) EC tablet 40 mg  40 mg Oral BID Dow Adolph N, DO   40 mg at 11/15/18 1007  . sodium chloride flush (NS) 0.9 % injection 3 mL  3 mL Intravenous Q12H Smith, Rondell A, MD   3 mL at 11/14/18 2131  . vitamin B-12 (CYANOCOBALAMIN) tablet 1,000 mcg  1,000 mcg Oral Daily Madelyn Flavors A, MD   1,000 mcg at 11/15/18 1007     Discharge Medications: Please see discharge summary for a list of discharge medications.  Relevant Imaging Results:  Relevant Lab Results:   Additional Information SSN:[id number]  [place] Boom Creighton Longley, LCSW

## 2018-11-15 NOTE — Progress Notes (Signed)
Physical Therapy Treatment Patient Details Name: Autumn [name] MRN: [id number] DOB: Aug 12, 1932 Today's Date: 11/15/2018    History of Present Illness 85 y.o. female with past medical history significant for HTN, LBBB, and GERD who presents to the ED via EMS from [facility] center for acute onset neck and back pain as well as vomiting. In ED became orthostatic with 3 minutes of standing and found to have elevated troponing levels. Admitted for observation 9/29.    PT Comments    Patient received in bed, agrees to PT session. Reports she is just feeling weak, but can try to walk a little. Patient performs bed mobility with mod independence, use of bed rails and increased time. She requires cues for hand placement for safe transfers from bed and BSC with min assist. She a was able to ambulate a total of 10 feet this visit with slow cadence, RW, and min guard. Patient will continue to benefit from skilled PT while here to improve strength and functional independence for return to independent living.        Follow Up Recommendations  SNF;Supervision for mobility/OOB     Equipment Recommendations  Rolling walker with 5" wheels    Recommendations for Other Services       Precautions / Restrictions Precautions Precautions: Fall Restrictions Weight Bearing Restrictions: No    Mobility  Bed Mobility Overal bed mobility: Needs Assistance Bed Mobility: Supine to Sit     Supine to sit: Supervision     General bed mobility comments: increased time and effort as well as use of bedrails  Transfers Overall transfer level: Needs assistance Equipment used: Rolling walker (2 wheeled) Transfers: Sit to/from Stand Sit to Stand: Min assist;From elevated surface         General transfer comment: min assist, cues for hand placement for safe transfers  Ambulation/Gait Ambulation/Gait assistance: Min guard Gait Distance (Feet): 10 Feet Assistive device: Rolling walker (2  wheeled)   Gait velocity: decr   General Gait Details: slow, shuffling steps reports she feels weak with mobility. Patient assisted to the [facility] and required assistance with cleaning up, gown was wet, required assistance to change gown. .   Stairs             Wheelchair Mobility    Modified Rankin (Stroke Patients Only)       Balance                                            Cognition Arousal/Alertness: Awake/alert Behavior During Therapy: Flat affect;WFL for tasks assessed/performed Overall Cognitive Status: Impaired/Different from baseline Area of Impairment: Problem solving;Following commands;Safety/judgement                       Following Commands: Follows one step commands with increased time;Follows one step commands inconsistently Safety/Judgement: Decreased awareness of safety   Problem Solving: Slow processing;Decreased initiation;Requires verbal cues;Requires tactile cues General Comments: word finding difficulties      Exercises      General Comments        Pertinent Vitals/Pain Pain Assessment: No/denies pain    Home Living                      Prior Function            PT Goals (current goals can now be found  in the care plan section) Acute Rehab PT Goals Patient Stated Goal: to go to STR before going home PT Goal Formulation: With patient Time For Goal Achievement: 11/27/18 Potential to Achieve Goals: Good Progress towards PT goals: Progressing toward goals    Frequency    Min 3X/week      PT Plan Discharge plan needs to be updated    Co-evaluation              AM-PAC PT "6 Clicks" Mobility   Outcome Measure  Help needed turning from your back to your side while in a flat bed without using bedrails?: None Help needed moving from lying on your back to sitting on the side of a flat bed without using bedrails?: None Help needed moving to and from a bed to a chair (including a  wheelchair)?: A Little Help needed standing up from a chair using your arms (e.g., wheelchair or bedside chair)?: A Little Help needed to walk in hospital room?: A Lot Help needed climbing 3-5 steps with a railing? : A Lot 6 Click Score: 18    End of Session Equipment Utilized During Treatment: Gait belt Activity Tolerance: Patient limited by lethargy;Patient limited by fatigue Patient left: with call bell/phone within reach;in chair;with chair alarm set Nurse Communication: Mobility status PT Visit Diagnosis: Muscle weakness (generalized) (M62.81);Difficulty in walking, not elsewhere classified (R26.2);Other abnormalities of gait and mobility (R26.89)     Time: 1030-1053 PT Time Calculation (min) (ACUTE ONLY): 23 min  Charges:  $Gait Training: 8-22 mins $Therapeutic Activity: 8-22 mins                     Abrahm Mancia, PT, GCS 11/15/18,12:41 PM

## 2018-11-15 NOTE — TOC Progression Note (Signed)
Transition of Care [facility]) - Progression Note    Patient Details  Name: Autumn [name] MRN: [id number] Date of Birth: 1932/10/14  Transition of Care [facility]) CM/SW [place], LCSW Phone Number: 11/15/2018, 1:47 PM  Clinical Narrative:   Pt chooses [facility] and they have extended a bed offer. [facility] has started insurance auth.    Expected Discharge Plan: Skilled Nursing Facility Barriers to Discharge: Ship broker, Continued Medical Work up  Expected Discharge Plan and Services Expected Discharge Plan: [place] In-house Referral: NA Discharge Planning Services: NA Post Acute Care Choice: [place] arrangements for the past 2 months: Single Family Home Expected Discharge Date: 11/15/18                                     Social Determinants of Health (SDOH) Interventions    Readmission Risk Interventions No flowsheet data found.

## 2018-11-15 NOTE — TOC Initial Note (Signed)
Transition of Care [facility]) - Initial/Assessment Note    Patient Details  Name: Autumn [name] MRN: [id number] Date of Birth: [birth year]  Transition of [facility]) CM/SW Contact:    Eileen Stanford, LCSW Phone Number: 11/15/2018, 11:08 AM  Clinical Narrative:     Pt alert and oriented. Pt lives alone. Pt is agreeable to SNF and would prefer that over home health. Pt suggest CSW inquire bed availability at [facility], other wise pt knows she just wants to stay in the [place] area. CSW to follow up with bed availability.   Once choice is made CSW will get SNF to initiate auth.           Expected Discharge Plan: Skilled Nursing Facility Barriers to Discharge: Insurance Authorization, Continued Medical Work up   Patient Goals and CMS Choice Patient states their goals for this hospitalization and ongoing recovery are:: " to go to rehab" CMS Medicare.gov Compare Post Acute Care list provided to:: Patient Choice offered to / list presented to : Patient  Expected Discharge Plan and Services Expected Discharge Plan: [place] In-house Referral: NA Discharge Planning Services: NA Post Acute Care Choice: [place] Living arrangements for the past 2 months: Single Family Home Expected Discharge Date: 11/15/18                                    Prior Living Arrangements/Services Living arrangements for the past 2 months: Single Family Home Lives with:: Self Patient language and need for interpreter reviewed:: Yes Do you feel safe going back to the place where you live?: Yes      Need for Family Participation in Patient Care: Yes (Comment) Care giver support system in place?: Yes (comment)   Criminal Activity/Legal Involvement Pertinent to Current Situation/Hospitalization: No - Comment as needed  Activities of Daily Living      Permission Sought/Granted Permission sought to share information with : Family Supports, Forensic psychologist    Share Information with NAME: Tashena Ibach  Permission granted to share info w AGENCY: [place] granted to share info w Relationship: son     Emotional Assessment Appearance:: Appears stated age Attitude/Demeanor/Rapport: Engaged Affect (typically observed): Accepting, Appropriate, Calm Orientation: : Oriented to Self, Oriented to Place, Oriented to  Time, Oriented to Situation Alcohol / Substance Use: Not Applicable Psych Involvement: No (comment)  Admission diagnosis:  Tachypnea [R06.82] Lactic acidosis [E87.2] Patient Active Problem List   Diagnosis Date Noted  . Pain of upper abdomen   . Postural dizziness with near syncope 11/13/2018  . Tachypnea   . Nausea and vomiting 11/12/2018  . Bilateral hydronephrosis 11/12/2018  . Elevated troponin 11/12/2018  . Lactic acidosis 11/12/2018  . Chronic neck and back pain 11/12/2018  . Essential hypertension 11/12/2018  . Normal coronary arteries 09/19/2013  . LBBB (left bundle branch block) 09/19/2013  . Congestive dilated cardiomyopathy ([place]) 07/24/2013   PCP:  Lajean Manes, MD Pharmacy:   [facility] [place] [place] [address] Phone: [phone] Fax: [phone]     Social Determinants of Health (SDOH) Interventions    Readmission Risk Interventions No flowsheet data found.

## 2018-11-15 NOTE — Progress Notes (Signed)
Cardiology Progress Note  Patient ID: ANJANNETTE WEYER MRN: [id number] DOB: September 13, 1932 Date of Encounter: 11/15/2018  Primary Cardiologist: [name], MD  Subjective  She reports her abdominal pain has improved.  Blood pressure much better with increased dose of carvedilol and losartan.  No complaints of chest pain, trouble breathing or lower extreme edema.  ROS:  All other ROS reviewed and negative. Pertinent positives noted in the HPI.     Inpatient Medications  Scheduled Meds: . aspirin  325 mg Oral Daily  . carvedilol  12.5 mg Oral BID  . Chlorhexidine Gluconate Cloth  6 each Topical Daily  . enoxaparin (LOVENOX) injection  40 mg Subcutaneous Q24H  . levothyroxine  50 mcg Oral QAC breakfast  . losartan  100 mg Oral Daily  . pantoprazole  40 mg Oral BID  . sodium chloride flush  3 mL Intravenous Q12H  . vitamin B-12  1,000 mcg Oral Daily   Continuous Infusions:  PRN Meds: acetaminophen **OR** acetaminophen, albuterol, alum & mag hydroxide-simeth, diclofenac sodium, HYDROcodone-acetaminophen, LORazepam   Vital Signs   Vitals:   11/14/18 1336 11/14/18 2048 11/14/18 2053 11/15/18 0400  BP: 132/70 (!) 146/74 (!) 149/65 (!) 146/71  Pulse: 78 74  73  Resp:  17  19  Temp: 98 F (36.7 C) 98 F (36.7 C)  97.8 F (36.6 C)  TempSrc: Oral Oral  Oral  SpO2:  95%  94%  Weight:    66.4 kg    Intake/Output Summary (Last 24 hours) at 11/15/2018 0951 Last data filed at 11/15/2018 3567 Gross per 24 hour  Intake 758 ml  Output 1675 ml  Net -917 ml   Last 3 Weights 11/15/2018 11/14/2018 10/02/2018  Weight (lbs) 146 lb 6.2 oz 144 lb 12.8 oz 150 lb  Weight (kg) 66.4 kg 65.681 kg 68.04 kg      Telemetry  Overnight telemetry shows normal sinus rhythm with heart rates in the 70-80 range, which I personally reviewed.   Physical Exam   Vitals:   11/14/18 1336 11/14/18 2048 11/14/18 2053 11/15/18 0400  BP: 132/70 (!) 146/74 (!) 149/65 (!) 146/71  Pulse: 78 74  73  Resp:  17  19   Temp: 98 F (36.7 C) 98 F (36.7 C)  97.8 F (36.6 C)  TempSrc: Oral Oral  Oral  SpO2:  95%  94%  Weight:    66.4 kg     Intake/Output Summary (Last 24 hours) at 11/15/2018 0951 Last data filed at 11/15/2018 0141 Gross per 24 hour  Intake 758 ml  Output 1675 ml  Net -917 ml    Last 3 Weights 11/15/2018 11/14/2018 10/02/2018  Weight (lbs) 146 lb 6.2 oz 144 lb 12.8 oz 150 lb  Weight (kg) 66.4 kg 65.681 kg 68.04 kg    Body mass index is 25.93 kg/m.  General: Well nourished, well developed, in no acute distress Head: Atraumatic, normal size  Eyes: PEERLA, EOMI  Neck: Supple, no JVD Endocrine: No thryomegaly Cardiac: Normal S1, S2; RRR; no murmurs, rubs, or gallops Lungs: Clear to auscultation bilaterally, no wheezing, rhonchi or rales  Abd: Soft, nontender, no hepatomegaly  Ext: No edema, pulses 2+ Musculoskeletal: No deformities, BUE and BLE strength normal and equal Skin: Warm and dry, no rashes   Neuro: Alert and oriented to person, place, time, and situation, CNII-XII grossly intact, no focal deficits  Psych: Normal mood and affect   Labs  High Sensitivity Troponin:   Recent Labs  Lab 11/12/18 0210 11/12/18 0425  11/12/18 1230 11/12/18 1902 11/13/18 0009  TROPONINIHS 42* 76* 196* 185* 156*     Cardiac EnzymesNo results for input(s): TROPONINI in the last 168 hours. No results for input(s): TROPIPOC in the last 168 hours.  Chemistry Recent Labs  Lab 11/12/18 0210  11/13/18 0811 11/14/18 0304 11/15/18 0732  NA 131*   < > 129* 130* 130*  K 4.5   < > 3.6 3.7 3.7  CL 97*   < > 96* 100 97*  CO2 18*   < > 21* 22 22  GLUCOSE 156*   < > 108* 107* 107*  BUN 21   < > 7* 12 14  CREATININE 1.01*   < > 0.70 0.85 0.75  CALCIUM 9.4   < > 8.9 8.7* 8.9  PROT 7.1  --   --  5.7*  --   ALBUMIN 3.9  --   --  2.9*  --   AST 26  --   --  26  --   ALT 16  --   --  15  --   ALKPHOS 42  --   --  35*  --   BILITOT 0.6  --   --  0.5  --   GFRNONAA 51*   < > >60 >60 >60  GFRAA 59*    < > >60 >60 >60  ANIONGAP 16*   < > 12 8 11    < > = values in this interval not displayed.    Hematology Recent Labs  Lab 11/12/18 0210 11/12/18 1345 11/13/18 0009  WBC 9.6  --  8.4  RBC 3.58*  --  3.53*  HGB 12.3  --  12.5  HCT 37.0 34.5 35.2*  MCV 103.4*  --  99.7  MCH 34.4*  --  35.4*  MCHC 33.2  --  35.5  RDW 12.5  --  12.8  PLT 262  --  231   BNP Recent Labs  Lab 11/12/18 0210  BNP 65.8    DDimer No results for input(s): DDIMER in the last 168 hours.   Radiology  No results found.  Cardiac Studies  TTE 11/13/2018  1. Left ventricular ejection fraction, by visual estimation, is 25%. The left ventricle has severely decreased function. Normal left ventricular size. There is no left ventricular hypertrophy. Severe diffuse hypokinesis with septal-lateral dyssynchrony  consistent with LBBB.  2. Left ventricular diastolic Doppler parameters are consistent with impaired relaxation pattern of LV diastolic filling.  3. Global right ventricle has normal systolic function.The right ventricular size is normal. No increase in right ventricular wall thickness.  4. The tricuspid valve is normal in structure. Tricuspid valve regurgitation is trivial.  5. The aortic valve is tricuspid Aortic valve regurgitation was not visualized by color flow Doppler. Mild aortic valve sclerosis without stenosis.  6. The mitral valve is normal in structure. No evidence of mitral valve regurgitation. No evidence of mitral stenosis.  7. Left atrial size was normal.  8. Right atrial size was normal.  9. The inferior vena cava is normal in size with greater than 50% respiratory variability, suggesting right atrial pressure of 3 mmHg. 10. The tricuspid regurgitant velocity is 2.40 m/s, and with an assumed right atrial pressure of 3 mmHg, the estimated right ventricular systolic pressure is normal at 26.0 mmHg. 11. Small to moderate pericardial effusion primarily adjacent to the RV, no tamponade  Patient  Profile  Ms. Prisk is a 85 y.o. female with a history of normal coronaries on cardiac catheterization  in 2015, non-ischemic cardiomyopathy with improved EF of 55-60% on last Echo in 2016, LBBB, hypertension, hypothyroidism, GERD, and anxiety who is being seen today for evaluation of elevated troponin in the setting of acute vomiting at the request of Dr. Katrinka Blazing.   Assessment & Plan   1.  Non-myocardial infarction troponin elevation -No evidence of acute coronary syndrome.  Likely secondary to reduced ejection fraction in the setting of a stress-induced cardiomyopathy. -She had a cardiac cath in 2015 with minimal CAD, no symptoms of chest pain to suggest this is ACS and does not need further work-up at this time.  2.  New onset subclinical left ventricular dysfunction with ejection fraction 25% -She has a normal BNP, normal chest x-ray, and is euvolemic on examination.  Overall her drop in ejection fraction I feel is likely related to a stress-induced cardiomyopathy in the setting of her severe lactic acidosis that was present on admission.  Her pre-existing left bundle branch block precludes a classic Takotsubo pattern, but on my personal review of her echocardiogram I feel this is the most likely explanation. -She had a normal cardiac cath about 5 years ago, but no symptoms to suggest an acute coronary syndrome. -Troponins were relatively unimpressive especially for the degree of myocardial dysfunction -We have increased her carvedilol to 12.5 mg twice daily as well as losartan 100 mg daily -We will arrange for her to have a follow-up appointment in our clinic and we can reassess her EF in 3 to 6 months  3.  Pericardial effusion -Mild to moderate and no symptoms of tamponade -We will follow this up on her echocardiogram in 36 months  4.  Hypertension -Increase medications as above  5.  Abdominal pain with celiac atherosclerotic disease -Admitted with abdominal pain nausea vomiting and a  lactic acidosis.  Evaluated by vascular surgery did not feel this was mesenteric ischemia.  She has been evaluated by GI for abdominal pain, but refused upper endoscopy.  She is DNR and does not really want aggressive medical treatment plans.  [facility] will sign off.   Medication Recommendations: Carvedilol 12.5 mg twice daily, losartan mg 100 daily, Lasix 20 mg as needed at discharge (this is not a standing dose) Other recommendations (labs, testing, etc): Repeat echocardiogram in 3 to 6 months Follow up as an outpatient: We will arrange a 2-week follow-up appointment in our APP clinic.  We will arrange for her to see Dr. Antoine Poche in 1 to 2 months.  For questions or updates, please contact [facility] Please consult [email or web address] for contact info under        Signed, Gerri Spore T. Flora Lipps, MD [facility]  [facility]  11/15/2018 9:51 AM

## 2018-11-15 NOTE — Significant Event (Signed)
Rapid Response Event Note  Overview: Time Called: 1153 Arrival Time: 1155 Event Type: Hypotension  Initial Focused Assessment: Per RN, Tele called NT to place patient back on the monitor.  When she placed patient on telemetry she was unresponsive.  RN to bedside HR 40-50s,  BP 71/40  Patient sitting in chair.   Lung sounds clear, heart tones regular  Interventions: NS bolus 3+ assist to bed.  Laid patient flat.  She started to wake.   BP 120/59  HR 67  RR18  O2 sat 94% on RA 12 lead EKG done  Dr [place] Crane and Dr Audie Box at bedside to assess patient.  Patient easy to awake and follows commands and answers questions.    BP 122/64  HR 64      Plan of Care (if not transferred):  Event Summary: Name of Physician Notified: Nevada Crane at 1153  Name of Consulting Physician Notified: Dr Audie Box at 1153  Outcome: Stayed in room and stabalized  Event End Time: 1230  Raliegh Ip

## 2018-11-15 NOTE — Plan of Care (Signed)
  Problem: Safety: Goal: Ability to remain free from injury will improve Outcome: Progressing   

## 2018-11-15 NOTE — Progress Notes (Signed)
Called by [place], [place], who stated that she had received call from central telemetry that patient was off the monitor. When she placed patient back on the monitor, she noted that patient's HR was in the 40s and that she could not wake up the patient.   Upon RN entering room, patient was found to be sitting in the recliner, diaphoretic, with HR in the 40s-50s. RN was unable to arouse patient. BP was found to be 71/40. SPO2 was 98% on room air. Rapid response RN was called to bedside. Dr. Nevada Crane and Dr. Audie Box were also called to bedside. Patient was assisted back to bed and vital signs were stablized. 12 lead EKG was obtained.  BP currently 117/53, HR 65, SPO2 94% on room air. Patient currently alert and oriented x4.   Will continue to monitor closely.

## 2018-11-15 NOTE — Progress Notes (Signed)
Received a call from bedside RN regarding patient being unresponsive after using the commode with low BP and low HR. NS IV fluid bolus started with quick response. Prior to response, stat IV glucagon was ordered for possible Beta blocker toxicity. Not administered as not indicated, will dc.   Upon arrival, Rapid response team and bedside RN present. Patient is alert but states she still does not feel good. No chest pain. BP now stable with MAP>70 and HR 67 on the monitor in the room. Suspect symptomatology 2/2 to vasovagal. 12 lead EKG obtained. Will stop IV fluid bolus and infuse at 50 cc/hr x 1L. Cardiology made aware by RN.

## 2018-11-15 NOTE — Discharge Summary (Signed)
Discharge Summary  Autumn [name] DOB: 07/29/1932  PCP: Merlene Laughter, MD  Admit date: 11/12/2018 Discharge date: 11/15/2018  Time spent: 35 minutes  Recommendations for Outpatient Follow-up:  1. Follow-up with GI 2. Follow-up with cardiology 3. Follow-up with your PCP 4. Follow-up with urology, please obtain urology referral from your PCP. 5. Take your medications as prescribed 6. Continue physical therapy 7. Fall precautions  Discharge Diagnoses:  Active Hospital Problems   Diagnosis Date Noted  . Pain of upper abdomen   . Postural dizziness with near syncope 11/13/2018  . Tachypnea   . Nausea and vomiting 11/12/2018  . Bilateral hydronephrosis 11/12/2018  . Elevated troponin 11/12/2018  . Lactic acidosis 11/12/2018  . Chronic neck and back pain 11/12/2018  . Essential hypertension 11/12/2018    Resolved Hospital Problems  No resolved problems to display.    Discharge Condition: Stable  Diet recommendation: Resume previous diet  Vitals:   11/14/18 2053 11/15/18 0400  BP: (!) 149/65 (!) 146/71  Pulse:  73  Resp:  19  Temp:  97.8 F (36.6 C)  SpO2:  94%    History of present illness:  Autumn [name] a 85 y.o.femalewith medical history significant ofhypertension, hypothyroidism, LBBB,arthritis,andGERD;who presents with complaints of nausea and vomiting. [name] began feeling lightheaded as though she may pass out and was unable to walk while brushing her teeth. She is able to sit down on the toilet in the bathroom and became nauseated and vomited. Associated symptoms include complaints of generalized malaise, shortness of breath, achy neck, and achy back pain. She reports that the back and neck pain complaints are not new and related to her history of arthritis. She has not had any significant cough, fever, headache, change in vision, focal weakness, dysuria, or change in bowel habits.  ED Course:Upon admission into the  emergency department patient was seen to be afebrile, pulse 80-106, respirations 15-30, blood pressures 137/94-172/74, and O2 saturation maintained on room air. Orthostatic vital signs were noted to be positive after standing for 3 minutes. Labs significant for MCV 103.4, MCH 34.4, sodium 131, chloride 97, CO2 18, BUN 21, creatinine 1.01, anion gap 16,lactic acid 4.6->3.8, and troponin42->76. CXRshowed no acute abnormality and UA negative for any signs of infection.Blood cultures were ordered. She was started on normal saline IV fluids at 100 mL/h.She has had no more nausea or vomiting since admission,but still does not feel well.  Hospital course complicated by elevated troponin, abnormal 2D echo with LVEF 25%, small to moderate pericardial effusion.  Seen by cardiology and patient declined aggressive cardiac work-up per cardiology.  Persistent abdominal discomfort. States up to 2 weeks prior to presentation was taking ibuprofen daily for months. Unintentional weight loss 5 lbs in the last 4-8 weeks. Seen by GI, declined EGD per GI.   11/15/18: Patient was seen and examined at her bedside this morning.  No acute events overnight.  Assessed by PT with recommendations for home health PT and DME rolling walker with 5 inches wheels.  No new complaints.  On the day of discharge, the patient was hemodynamically stable.  She will need to follow-up with cardiology and her PCP post hospitalization.  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 Seen by vascular surgery, unlikely reason for symptoms. Self-reported daily use of NSAID, ibuprofen for months and  5 pound unintentional weight loss the past 4 to 8 weeks Seen by GI but patient declined EGD. Start Protonix 40 mg twice daily x6 weeks then 40 mg daily afterwards as recommended by GI Follow-up with GI outpatient  Acute on chronic combined systolic and diastolic CHF/small to moderate pericardial effusion. Seen by Dr. Percival Spanish, per review of medical records on 04/10/2014 she had an ejection fraction of 35%, had a left heart cath in September 02, 2013 with history of nonischemic cardiomyopathy 2D echo done on 11/13/2018 showed LVEF 25% with diffuse hypokinesis and septal lateral dyssynchrony consistent with LBBB as well as grade 1 diastolic dysfunction Seen by cardiology, patient declines aggressive cardiac work-up. Continue cardiac medications Follow-up with your cardiologist outpatient  Essential hypertension complicated by orthostatic hypotension Continue cardiac medications No diuretics due to orthostatic hypotension Euvolemic on exam Normal BNP 65 on 11/12/2018 Follow-up with cardiology  Elevated troponin, likely demand ischemia High-sensitivity troponin peaked at 196 and trended down Seen by cardiology, no plan for further ischemic work-up; patient declines aggressive cardiac work-up. Follow-up with cardiology outpatient  Resolved lactic acidosis, suspect secondary to poor perfusion with acute dehydration No sign of active infective process Presented with lactic acid 4.6 on 11/12/2018, improved post IV fluid hydration Lactic acid 1.7 on 11/13/2018   Chronic euvolemic hyponatremia Asymptomatic Appears to have had hyponatremia since 2018 To be at her baseline with sodium 130 on 11/15/18. Follow-up with your PCP  Methicillin susceptible coagulase-negative staph bacteremia, likely contaminant Afebrile no leukocytosis Not toxic appearing  Urinary retention with bilateral hydronephrosis Follow-up with urology; obtain referral from your primary care provider. No acute kidney  injuries, creatinine stable 0.75 on 11/15/2018 and GFR greater than 60 on 11/15/18.  Hypothyroidism Continue levothyroxine  GERD Continue Protonix  Code Status:full  Consultants:  Cardiology  GI  Procedures:  echo    Discharge Exam: BP (!) 146/71 (BP Location: Left Arm)   Pulse 73   Temp 97.8 F (36.6 C) (Oral)   Resp 19   Wt 66.4 kg   SpO2 94%   BMI 25.93 kg/m  . General: 85 y.o. year-old female well developed well nourished in no acute distress.  Alert and oriented x3. . Cardiovascular: Regular rate and rhythm with no rubs or gallops.  No thyromegaly or JVD noted.   Marland Kitchen Respiratory: Clear to auscultation with no wheezes or rales. Good inspiratory effort. . Abdomen: Soft nontender nondistended with normal bowel sounds x4 quadrants. . Musculoskeletal: No lower extremity edema. 2/4 pulses in all 4 extremities. Marland Kitchen Psychiatry: Mood is appropriate for condition and setting  Discharge Instructions You were cared for by a hospitalist during your hospital stay. If you have any questions about your discharge medications or the care you received while you were in the hospital after you are discharged, you can call the unit and asked to speak with the hospitalist on call if the hospitalist that took care of you is not available. Once you are discharged, your primary care physician will handle any further medical issues. Please note that NO REFILLS for any discharge medications will be authorized once you are discharged, as it is imperative that you return to your primary care physician (or establish a relationship with a primary care physician if you do not have one) for your aftercare needs so that they can  reassess your need for medications and monitor your lab values.   Allergies as of 11/15/2018      Reactions   Hydrochlorothiazide Other (See Comments)   REACTION: hyponatremia   Ramipril Other (See Comments)   REACTION: GI upset   Remeron [mirtazapine] Other (See Comments)    REACTION: weakness   Sulfa Antibiotics Rash      Medication List    STOP taking these medications   HYDROcodone-acetaminophen 5-325 MG tablet Commonly known as: NORCO/VICODIN   omeprazole 20 MG capsule Commonly known as: PRILOSEC     TAKE these medications   aspirin 325 MG tablet Take 1 tablet (325 mg total) by mouth daily.   carvedilol 12.5 MG tablet Commonly known as: COREG Take 1 tablet (12.5 mg total) by mouth 2 (two) times daily. What changed:   medication strength  how much to take   diclofenac sodium 1 % Gel Commonly known as: Voltaren Apply 4 g topically 4 (four) times daily. What changed:   when to take this  reasons to take this   estradiol 1 MG tablet Commonly known as: ESTRACE Take 1 mg by mouth daily.   HAIR SKIN AND NAILS FORMULA PO Take 1 tablet by mouth daily.   levothyroxine 50 MCG tablet Commonly known as: SYNTHROID Take 50 mcg by mouth daily before breakfast.   LORazepam 0.5 MG tablet Commonly known as: ATIVAN Take 1 tablet (0.5 mg total) by mouth at bedtime as needed for sleep.   losartan 100 MG tablet Commonly known as: COZAAR Take 1 tablet (100 mg total) by mouth daily. What changed:   medication strength  how much to take   MELATONIN PO Take 1 mL by mouth at bedtime.   pantoprazole 40 MG tablet Commonly known as: PROTONIX Take 1 tablet (40 mg total) by mouth 2 (two) times daily. Take 1 tablet 40 mg BID x 6 weeks then 1 tab 40 mg daily afterwards   TURMERIC CURCUMIN PO Take 1 tablet by mouth daily.   VITAMIN B-12 PO Take 1 tablet by mouth daily.   VITAMIN C PO Take 1 tablet by mouth daily.   VITAMIN D PO Take 1 tablet by mouth daily.            Durable Medical Equipment  (From admission, onward)         Start     Ordered   11/15/18 0600  For home use only DME Walker rolling  Once    Comments: 5" wheels  Question:  Patient needs a walker to treat with the following condition  Answer:  Ambulatory  dysfunction   11/15/18 0601         Allergies  Allergen Reactions  . Hydrochlorothiazide Other (See Comments)    REACTION: hyponatremia  . Ramipril Other (See Comments)    REACTION: GI upset  . Remeron [Mirtazapine] Other (See Comments)    REACTION: weakness  . Sulfa Antibiotics Rash      The results of significant diagnostics from this hospitalization (including imaging, microbiology, ancillary and laboratory) are listed below for reference.    Significant Diagnostic Studies: Dg Chest Port 1 View  Result Date: 11/12/2018 CLINICAL DATA:  Tachypnea EXAM: PORTABLE CHEST 1 VIEW COMPARISON:  01/30/2017 FINDINGS: Low volume chest with asymmetric elevation of the right diaphragm, chronic. Normal heart size and mediastinal contours for technique. There is no edema, consolidation, effusion, or pneumothorax. IMPRESSION: No evidence of acute disease. Electronically Signed   By: Marnee SpringJonathon  Watts M.D.   On:  11/12/2018 05:51   Ct Angio Chest/abd/pel For Dissection W And/or Wo Contrast  Result Date: 11/12/2018 CLINICAL DATA:  Chest and back pain with aortic dissection suspected EXAM: CT ANGIOGRAPHY CHEST, ABDOMEN AND PELVIS TECHNIQUE: Multidetector CT imaging through the chest, abdomen and pelvis was performed using the standard protocol during bolus administration of intravenous contrast. Multiplanar reconstructed images and MIPs were obtained and reviewed to evaluate the vascular anatomy. CONTRAST:  80mL OMNIPAQUE IOHEXOL 350 MG/ML SOLN COMPARISON:  Abdomen and pelvis CT 09/09/2008.  Chest CTA 01/31/2017 FINDINGS: CTA CHEST FINDINGS Cardiovascular: Noncontrast phase shows no intramural hematoma no aortic dissection or aneurysm. No visible pulmonary artery filling defect. Scattered atherosclerotic calcification of the aorta. Right coronary calcified plaque is noted. Mild atheromatous narrowing at the proximal left subclavian artery. Mediastinum/Nodes: No adenopathy or mass. Lungs/Pleura: Mild dependent  atelectasis and right upper lobe scarring. There is no edema, consolidation, effusion, or pneumothorax. Musculoskeletal: Diffuse degenerative disc narrowing with endplate ridging. There is exaggerated thoracic kyphosis and mild scoliosis. Notable T7-8 and T8-9 central disc protrusion. Review of the MIP images confirms the above findings. CTA ABDOMEN AND PELVIS FINDINGS VASCULAR Aorta: Multifocal atherosclerotic plaque. No aneurysm or dissection. Celiac: High-grade narrowing at the celiac origin from atherosclerosis and likely from median arcuate ligament. SMA: Widely patent SMA. No branch occlusion or beading. Prominent pancreatic arcade compensating for the proximal celiac narrowing. Renals: Atherosclerotic plaque at both ostia without flow limiting stenosis. Early branching left renal artery. IMA: Patent Inflow: Atherosclerotic plaque without dissection or aneurysm. Veins: Negative in the arterial phase Review of the MIP images confirms the above findings. NON-VASCULAR Hepatobiliary: No focal liver abnormality.No evidence of biliary obstruction or stone. Pancreas: Unremarkable. Spleen: Unremarkable. Adrenals/Urinary Tract: Negative adrenals. Bilateral hydroureteronephrosis to the level of distended bladder. Stomach/Bowel:  No obstruction.  No evidence of inflammation. Lymphatic: No acute vascular abnormality.  No mass or adenopathy. Reproductive:Hysterectomy. Other: No ascites or pneumoperitoneum. Musculoskeletal: No acute abnormalities. Severe diffuse lumbar spine degeneration with dextroscoliosis. No acute osseous finding Review of the MIP images confirms the above findings. IMPRESSION: 1. No acute vascular finding including evidence of acute aortic syndrome. 2. Atherosclerosis with proximal celiac narrowing but widely patent SMA and peripancreatic arcade. 3. Distended bladder resulting in bilateral hydroureteronephrosis. Electronically Signed   By: Marnee Spring M.D.   On: 11/12/2018 06:50    Microbiology:  Recent Results (from the past 240 hour(s))  Culture, blood (routine x 2)     Status: Abnormal   Collection Time: 11/12/18  2:10 AM   Specimen: BLOOD  Result Value Ref Range Status   Specimen Description   Final    BLOOD RIGHT ANTECUBITAL Performed at [facility], [address], [place], [place] [address]    Special Requests   Final    BOTTLES DRAWN AEROBIC AND ANAEROBIC Blood Culture adequate volume Performed at [facility], [address], [place], [place] [address]    Culture  Setup Time   Final    GRAM POSITIVE COCCI IN BOTH AEROBIC AND ANAEROBIC BOTTLES CRITICAL RESULT CALLED TO, READ BACK BY AND VERIFIED WITH: PHARMD T BAUMEISTER [id number] MLM    Culture (A)  Final    STAPHYLOCOCCUS SPECIES (COAGULASE NEGATIVE) THE SIGNIFICANCE OF ISOLATING THIS ORGANISM FROM A SINGLE SET OF BLOOD CULTURES WHEN MULTIPLE SETS ARE DRAWN IS UNCERTAIN. PLEASE NOTIFY THE MICROBIOLOGY DEPARTMENT WITHIN ONE WEEK IF SPECIATION AND SENSITIVITIES ARE REQUIRED. Performed at [facility], [address], [place], [place] [address]    Report Status  11/15/2018 FINAL  Final  Blood Culture ID Panel (Reflexed)     Status: Abnormal   Collection Time: 11/12/18  2:10 AM  Result Value Ref Range Status   Enterococcus species NOT DETECTED NOT DETECTED Final   Listeria monocytogenes NOT DETECTED NOT DETECTED Final   Staphylococcus species DETECTED (A) NOT DETECTED Final    Comment: Methicillin (oxacillin) susceptible coagulase negative staphylococcus. Possible blood culture contaminant (unless isolated from more than one blood culture draw or clinical case suggests pathogenicity). No antibiotic treatment is indicated for blood  culture contaminants. CRITICAL RESULT CALLED TO, READ BACK BY AND VERIFIED WITH: PHARMD T BAUMEISTER [id number] MLM    Staphylococcus aureus (BCID) NOT DETECTED NOT DETECTED Final   Methicillin resistance NOT DETECTED NOT DETECTED Final   Streptococcus  species NOT DETECTED NOT DETECTED Final   Streptococcus agalactiae NOT DETECTED NOT DETECTED Final   Streptococcus pneumoniae NOT DETECTED NOT DETECTED Final   Streptococcus pyogenes NOT DETECTED NOT DETECTED Final   Acinetobacter baumannii NOT DETECTED NOT DETECTED Final   Enterobacteriaceae species NOT DETECTED NOT DETECTED Final   Enterobacter cloacae complex NOT DETECTED NOT DETECTED Final   Escherichia coli NOT DETECTED NOT DETECTED Final   Klebsiella oxytoca NOT DETECTED NOT DETECTED Final   Klebsiella pneumoniae NOT DETECTED NOT DETECTED Final   Proteus species NOT DETECTED NOT DETECTED Final   Serratia marcescens NOT DETECTED NOT DETECTED Final   Haemophilus influenzae NOT DETECTED NOT DETECTED Final   Neisseria meningitidis NOT DETECTED NOT DETECTED Final   Pseudomonas aeruginosa NOT DETECTED NOT DETECTED Final   Candida albicans NOT DETECTED NOT DETECTED Final   Candida glabrata NOT DETECTED NOT DETECTED Final   Candida krusei NOT DETECTED NOT DETECTED Final   Candida parapsilosis NOT DETECTED NOT DETECTED Final   Candida tropicalis NOT DETECTED NOT DETECTED Final    Comment: Performed at [facility], [address], [place], [place] [address]  SARS CORONAVIRUS 2 (TAT 6-24 HRS) Nasopharyngeal Nasopharyngeal Swab     Status: None   Collection Time: 11/12/18  5:47 AM   Specimen: Nasopharyngeal Swab  Result Value Ref Range Status   SARS Coronavirus 2 NEGATIVE NEGATIVE Final    Comment: (NOTE) SARS-CoV-2 target nucleic acids are NOT DETECTED. The SARS-CoV-2 RNA is generally detectable in upper and lower respiratory specimens during the acute phase of infection. Negative results do not preclude SARS-CoV-2 infection, do not rule out co-infections with other pathogens, and should not be used as the sole basis for treatment or other patient management decisions. Negative results must be combined with clinical observations, patient history, and epidemiological information.  The expected result is Negative. Fact Sheet for Patients: HairSlick.no Fact Sheet for Healthcare Providers: quierodirigir.com This test is not yet approved or cleared by the [place] FDA and  has been authorized for detection and/or diagnosis of SARS-CoV-2 by FDA under an Emergency Use Authorization (EUA). This EUA will remain  in effect (meaning this test can be used) for the duration of the COVID-19 declaration under Section 56 4(b)(1) of the Act, 21 U.S.C. section 360bbb-3(b)(1), unless the authorization is terminated or revoked sooner. Performed at [facility], [address] [place], [place] [address]   Culture, blood (routine x 2)     Status: None (Preliminary result)   Collection Time: 11/12/18  7:02 PM   Specimen: BLOOD LEFT HAND  Result Value Ref Range Status   Specimen Description BLOOD LEFT HAND  Final   Special Requests   Final  BOTTLES DRAWN AEROBIC ONLY Blood Culture adequate volume   Culture   Final    NO GROWTH 3 DAYS Performed at [facility], [address] [place], [place] [address]    Report Status PENDING  Incomplete     Labs: Basic Metabolic Panel: Recent Labs  Lab 11/12/18 0210 11/13/18 0009 11/13/18 0811 11/14/18 0304 11/15/18 0732  NA 131* 130* 129* 130* 130*  K 4.5 3.7 3.6 3.7 3.7  CL 97* 98 96* 100 97*  CO2 18* 20* 21* 22 22  GLUCOSE 156* 118* 108* 107* 107*  BUN 21 8 7* 12 14  CREATININE 1.01* 0.77 0.70 0.85 0.75  CALCIUM 9.4 9.0 8.9 8.7* 8.9   Liver Function Tests: Recent Labs  Lab 11/12/18 0210 11/14/18 0304  AST 26 26  ALT 16 15  ALKPHOS 42 35*  BILITOT 0.6 0.5  PROT 7.1 5.7*  ALBUMIN 3.9 2.9*   Recent Labs  Lab 11/12/18 0210  LIPASE 25   No results for input(s): AMMONIA in the last 168 hours. CBC: Recent Labs  Lab 11/12/18 0210 11/12/18 1345 11/13/18 0009  WBC 9.6  --  8.4  NEUTROABS 6.2  --   --   HGB 12.3  --  12.5  HCT 37.0 34.5  35.2*  MCV 103.4*  --  99.7  PLT 262  --  231   Cardiac Enzymes: No results for input(s): CKTOTAL, CKMB, CKMBINDEX, TROPONINI in the last 168 hours. BNP: BNP (last 3 results) Recent Labs    11/12/18 0210  BNP 65.8    ProBNP (last 3 results) No results for input(s): PROBNP in the last 8760 hours.  CBG: Recent Labs  Lab 11/12/18 0208  GLUCAP 145*       Signed:  Darlin Droparole N Jamarques Pinedo, MD [facility] 11/15/2018, 9:56 AM

## 2018-11-16 MED ORDER — INFLUENZA VAC A&B SA ADJ QUAD 0.5 ML IM PRSY
0.5000 mL | PREFILLED_SYRINGE | INTRAMUSCULAR | Status: DC
  Filled 2018-11-16: qty 0.5

## 2018-11-16 NOTE — TOC Transition Note (Signed)
Transition of Care [facility]) - CM/SW Discharge Note   Patient Details  Name: Autumn [name] MRN: [id number] Date of Birth: 02-14-1932  [facility] CM/SW Contact:  Bary Castilla, LCSW Phone Number: [phone] 11/16/2018, 2:10 PM   Clinical Narrative:     CSW followed up with patient as requested due to patient having questions about facility. CSW answered patient questions and she stated that she was still agreeable with going to [facility].  CSW followed up with [facility] to ascertain if authorization has come back. CSW was informed that authorization did not come back and patient would need new COVID test. April at facility stated it would most likely be Monday for authorization to come back.  CSW informed RN that new COVID test should be ordered.  CSW team should continue to follow for discharge planning.    Barriers to Discharge: Ship broker, Continued Medical Work up   Patient Goals and CMS Choice Patient states their goals for this hospitalization and ongoing recovery are:: " to go to rehab" CMS Medicare.gov Compare Post Acute Care list provided to:: Patient Choice offered to / list presented to : Patient  Discharge Placement                       Discharge Plan and Services In-house Referral: NA Discharge Planning Services: NA Post Acute Care Choice: Skilled Nursing Facility                               Social Determinants of Health (SDOH) Interventions     Readmission Risk Interventions No flowsheet data found.

## 2018-11-16 NOTE — Progress Notes (Addendum)
Discharge Summary  Autumn [name] ZOX:[id number] DOB: [birth year]  PCP: Autumn Laughter, MD  Admit date: 11/12/2018 Discharge date: 11/16/2018  Time spent: 35 minutes  Recommendations for Outpatient Follow-up:  1. Follow-up with GI 2. Follow-up with cardiology 3. Follow-up with your PCP 4. Follow-up with urology, please obtain urology referral from your PCP. 5. Take your medications as prescribed 6. Continue physical therapy 7. Fall precautions  Discharge delayed on 11/15/18 due to overall generalized weakness.  Reassessed by PT OT who recommended SNF.  CSW consulted to assist with placement.  Discharge Diagnoses:  Active Hospital Problems   Diagnosis Date Noted   Pain of upper abdomen    Postural dizziness with near syncope 11/13/2018   Tachypnea    Nausea and vomiting 11/12/2018   Bilateral hydronephrosis 11/12/2018   Elevated troponin 11/12/2018   Lactic acidosis 11/12/2018   Chronic neck and back pain 11/12/2018   Essential hypertension 11/12/2018    Resolved Hospital Problems  No resolved problems to display.    Discharge Condition: Stable  Diet recommendation: Resume previous diet  Vitals:   11/15/18 1439 11/16/18 0431  BP: (!) 115/57 (!) 149/72  Pulse: 68 72  Resp:  16  Temp:  97.9 F (36.6 C)  SpO2: 95% 97%    History of present illness:  Autumn [name] a 85 y.o.femalewith medical history significant ofhypertension, hypothyroidism, LBBB,arthritis,andGERD;who presents with complaints of nausea and vomiting. Autumn [name] began feeling lightheaded as though she may pass out and was unable to walk while brushing her teeth. She is able to sit down on the toilet in the bathroom and became nauseated and vomited. Associated symptoms include complaints of generalized malaise, shortness of breath, achy neck, and achy back pain. She reports that the back and neck pain complaints are not new and related to her history of arthritis. She has not had  any significant cough, fever, headache, change in vision, focal weakness, dysuria, or change in bowel habits.  ED Course:Upon admission into the emergency department patient was seen to be afebrile, pulse 80-106, respirations 15-30, blood pressures 137/94-172/74, and O2 saturation maintained on room air. Orthostatic vital signs were noted to be positive after standing for 3 minutes. Labs significant for MCV 103.4, MCH 34.4, sodium 131, chloride 97, CO2 18, BUN 21, creatinine 1.01, anion gap 16,lactic acid 4.6->3.8, and troponin42->76. CXRshowed no acute abnormality and UA negative for any signs of infection.Blood cultures were ordered. She was started on normal saline IV fluids at 100 mL/h.She has had no more nausea or vomiting since admission,but still does not feel well.  Hospital course complicated by elevated troponin, abnormal 2D echo with LVEF 25%, small to moderate pericardial effusion.  Seen by cardiology and patient declined aggressive cardiac work-up per cardiology.  Persistent abdominal discomfort. States up to 2 weeks prior to presentation was taking ibuprofen daily for months. Unintentional weight loss 5 lbs in the last 4-8 weeks. Seen by GI, declined EGD per GI.   11/15/18: Patient was seen and examined at her bedside this morning.  No acute events overnight.  Assessed by PT with recommendations for home health PT and DME rolling walker with 5 inches wheels.  No new complaints.  11/16/18: Patient seen and examined at bedside this morning.  No acute events overnight.  Denies any chest pain or dyspnea or palpitations.  Reports persistent generalized weakness.  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 Seen by vascular surgery, unlikely reason for symptoms. Self-reported daily use of NSAID, ibuprofen for months and 5 pound unintentional weight loss the past 4 to 8 weeks Seen by GI but patient declined EGD. Start Protonix 40 mg twice daily x6 weeks then 40 mg daily afterwards as recommended by GI Follow-up with GI outpatient  Acute on chronic combined systolic and diastolic CHF/small to moderate pericardial effusion. Seen by Dr. Percival Spanish, per review of medical records on 04/10/2014 she had an ejection fraction of 35%, had a left heart cath in September 02, 2013 with history of nonischemic cardiomyopathy 2D echo done on 11/13/2018 showed LVEF 25% with diffuse hypokinesis and septal lateral dyssynchrony consistent with LBBB as well as grade 1 diastolic dysfunction Seen by cardiology, patient declines aggressive cardiac work-up. Continue cardiac medications Follow-up with your cardiologist outpatient  Generalized weakness with physical debility likely in the setting of acute illness Continue PT OT Encourage oral protein calorie intake Continue Ensure BID Dietary consult Fall precautions  Essential hypertension complicated by orthostatic hypotension Continue cardiac medications No diuretics due to orthostatic hypotension Euvolemic on exam Normal BNP 65 on 11/12/2018 Follow-up with cardiology  Elevated troponin, likely demand ischemia High-sensitivity troponin peaked at 196 and trended down Seen by cardiology, no plan for further ischemic work-up; patient declines aggressive cardiac work-up. Follow-up with cardiology outpatient  Resolved lactic acidosis, suspect secondary to poor perfusion with acute dehydration No sign of active infective process Presented with lactic acid 4.6 on 11/12/2018, improved post IV fluid hydration Lactic acid 1.7 on 11/13/2018   Chronic euvolemic hyponatremia Asymptomatic Appears  to have had hyponatremia since 2018 To be at her baseline with sodium 130 on 11/15/18. Follow-up with your PCP  Methicillin susceptible coagulase-negative staph bacteremia, likely contaminant Afebrile no leukocytosis Not toxic appearing  Urinary retention with bilateral hydronephrosis Follow-up with urology; obtain referral from your primary care provider. No acute kidney injuries, creatinine stable 0.75 on 11/15/2018 and GFR greater than 60 on 11/15/18.  Hypothyroidism Continue levothyroxine  GERD Continue Protonix  Code Status:full  Consultants:  Cardiology  GI  Procedures:  echo    Discharge Exam: BP (!) 149/72 (BP Location: Right Arm)    Pulse 72    Temp 97.9 F (36.6 C) (Oral)    Resp 16    Wt 65.3 kg    SpO2 97%    BMI 25.50 kg/m   General: 85 y.o. year-old female well-developed well-nourished no acute distress.  Alert and oriented x3.    Cardiovascular: Regular rate and rhythm no rubs or gallops no JVD or thyromegaly noted.    Respiratory: Clear to auscultation no wheezes or rales.  Poor inspiratory effort.  Abdomen: Soft nontender nondistended normal bowel sounds present.    Musculoskeletal: No lower extremity edema.  12 4 pulses in all 4 extremities.    Psychiatry: Mood is appropriate condition and setting.  Discharge Instructions You were cared for by a hospitalist during your hospital stay. If you have any questions about your discharge medications or the care you received while you were in the hospital after you are discharged, you can call the unit and asked to speak with the hospitalist on call if the hospitalist that took care of you is not available. Once  you are discharged, your primary care physician will handle any further medical issues. Please note that NO REFILLS for any discharge medications will be authorized once you are discharged, as it is imperative that you return to your primary care physician (or establish a relationship with a  primary care physician if you do not have one) for your aftercare needs so that they can reassess your need for medications and monitor your lab values.   Allergies as of 11/16/2018      Reactions   Hydrochlorothiazide Other (See Comments)   REACTION: hyponatremia   Ramipril Other (See Comments)   REACTION: GI upset   Remeron [mirtazapine] Other (See Comments)   REACTION: weakness   Sulfa Antibiotics Rash      Medication List    STOP taking these medications   HYDROcodone-acetaminophen 5-325 MG tablet Commonly known as: NORCO/VICODIN   omeprazole 20 MG capsule Commonly known as: PRILOSEC     TAKE these medications   aspirin 325 MG tablet Take 1 tablet (325 mg total) by mouth daily.   carvedilol 12.5 MG tablet Commonly known as: COREG Take 1 tablet (12.5 mg total) by mouth 2 (two) times daily. What changed:   medication strength  how much to take   diclofenac sodium 1 % Gel Commonly known as: Voltaren Apply 4 g topically 4 (four) times daily. What changed:   when to take this  reasons to take this   estradiol 1 MG tablet Commonly known as: ESTRACE Take 1 mg by mouth daily.   HAIR SKIN AND NAILS FORMULA PO Take 1 tablet by mouth daily.   levothyroxine 50 MCG tablet Commonly known as: SYNTHROID Take 50 mcg by mouth daily before breakfast.   LORazepam 0.5 MG tablet Commonly known as: ATIVAN Take 1 tablet (0.5 mg total) by mouth at bedtime as needed for sleep.   losartan 100 MG tablet Commonly known as: COZAAR Take 1 tablet (100 mg total) by mouth daily. What changed:   medication strength  how much to take   MELATONIN PO Take 1 mL by mouth at bedtime.   pantoprazole 40 MG tablet Commonly known as: PROTONIX Take 1 tablet (40 mg total) by mouth 2 (two) times daily. Take 1 tablet 40 mg BID x 6 weeks then 1 tab 40 mg daily afterwards   TURMERIC CURCUMIN PO Take 1 tablet by mouth daily.   VITAMIN B-12 PO Take 1 tablet by mouth daily.   VITAMIN  C PO Take 1 tablet by mouth daily.   VITAMIN D PO Take 1 tablet by mouth daily.            Durable Medical Equipment  (From admission, onward)         Start     Ordered   11/15/18 0600  For home use only DME Walker rolling  Once    Comments: 5" wheels  Question:  Patient needs a walker to treat with the following condition  Answer:  Ambulatory dysfunction   11/15/18 0601         Allergies  Allergen Reactions   Hydrochlorothiazide Other (See Comments)    REACTION: hyponatremia   Ramipril Other (See Comments)    REACTION: GI upset   Remeron [Mirtazapine] Other (See Comments)    REACTION: weakness   Sulfa Antibiotics Rash    Contact information for follow-up providers    Jodelle Gross, NP Follow up on 11/26/2018.   Specialties: Nurse Practitioner, Radiology, Cardiology Why: Please arrive 15 minutes early for  your 9:45am post-hospital cardiology follow-up appointment Contact information: [address] [place] [address] [phone]        [name], MD Follow up on 01/31/2019.   Specialty: Cardiology Why: Please arrive 15 minutes early for your 10am appointment Contact information: [address] [place] [address] [phone]            Contact information for after-discharge care    Destination    [facility] Preferred SNF .   Service: Skilled Nursing Contact information: [address] [place] [address] [phone]                   The results of significant diagnostics from this hospitalization (including imaging, microbiology, ancillary and laboratory) are listed below for reference.    Significant Diagnostic Studies: Dg Chest Port 1 View  Result Date: 11/12/2018 CLINICAL DATA:  Tachypnea EXAM: PORTABLE CHEST 1 VIEW COMPARISON:  01/30/2017 FINDINGS: Low volume chest with asymmetric elevation of the right diaphragm, chronic. Normal heart size and  mediastinal contours for technique. There is no edema, consolidation, effusion, or pneumothorax. IMPRESSION: No evidence of acute disease. Electronically Signed   By: Marnee Spring M.D.   On: 11/12/2018 05:51   Ct Angio Chest/abd/pel For Dissection W And/or Wo Contrast  Result Date: 11/12/2018 CLINICAL DATA:  Chest and back pain with aortic dissection suspected EXAM: CT ANGIOGRAPHY CHEST, ABDOMEN AND PELVIS TECHNIQUE: Multidetector CT imaging through the chest, abdomen and pelvis was performed using the standard protocol during bolus administration of intravenous contrast. Multiplanar reconstructed images and MIPs were obtained and reviewed to evaluate the vascular anatomy. CONTRAST:  86mL OMNIPAQUE IOHEXOL 350 MG/ML SOLN COMPARISON:  Abdomen and pelvis CT 09/09/2008.  Chest CTA 01/31/2017 FINDINGS: CTA CHEST FINDINGS Cardiovascular: Noncontrast phase shows no intramural hematoma no aortic dissection or aneurysm. No visible pulmonary artery filling defect. Scattered atherosclerotic calcification of the aorta. Right coronary calcified plaque is noted. Mild atheromatous narrowing at the proximal left subclavian artery. Mediastinum/Nodes: No adenopathy or mass. Lungs/Pleura: Mild dependent atelectasis and right upper lobe scarring. There is no edema, consolidation, effusion, or pneumothorax. Musculoskeletal: Diffuse degenerative disc narrowing with endplate ridging. There is exaggerated thoracic kyphosis and mild scoliosis. Notable T7-8 and T8-9 central disc protrusion. Review of the MIP images confirms the above findings. CTA ABDOMEN AND PELVIS FINDINGS VASCULAR Aorta: Multifocal atherosclerotic plaque. No aneurysm or dissection. Celiac: High-grade narrowing at the celiac origin from atherosclerosis and likely from median arcuate ligament. SMA: Widely patent SMA. No branch occlusion or beading. Prominent pancreatic arcade compensating for the proximal celiac narrowing. Renals: Atherosclerotic plaque at both  ostia without flow limiting stenosis. Early branching left renal artery. IMA: Patent Inflow: Atherosclerotic plaque without dissection or aneurysm. Veins: Negative in the arterial phase Review of the MIP images confirms the above findings. NON-VASCULAR Hepatobiliary: No focal liver abnormality.No evidence of biliary obstruction or stone. Pancreas: Unremarkable. Spleen: Unremarkable. Adrenals/Urinary Tract: Negative adrenals. Bilateral hydroureteronephrosis to the level of distended bladder. Stomach/Bowel:  No obstruction.  No evidence of inflammation. Lymphatic: No acute vascular abnormality.  No mass or adenopathy. Reproductive:Hysterectomy. Other: No ascites or pneumoperitoneum. Musculoskeletal: No acute abnormalities. Severe diffuse lumbar spine degeneration with dextroscoliosis. No acute osseous finding Review of the MIP images confirms the above findings. IMPRESSION: 1. No acute vascular finding including evidence of acute aortic syndrome. 2. Atherosclerosis with proximal celiac narrowing but widely patent SMA and peripancreatic arcade. 3. Distended bladder resulting in bilateral hydroureteronephrosis. Electronically Signed   By:  Marnee SpringJonathon  Watts M.D.   On: 11/12/2018 06:50    Microbiology: Recent Results (from the past 240 hour(s))  Culture, blood (routine x 2)     Status: Abnormal   Collection Time: 11/12/18  2:10 AM   Specimen: BLOOD  Result Value Ref Range Status   Specimen Description   Final    BLOOD RIGHT ANTECUBITAL Performed at [facility], [address] [place], [place] [address]    Special Requests   Final    BOTTLES DRAWN AEROBIC AND ANAEROBIC Blood Culture adequate volume Performed at [facility], [address] [place], [place] [address]    Culture  Setup Time   Final    GRAM POSITIVE COCCI IN BOTH AEROBIC AND ANAEROBIC BOTTLES CRITICAL RESULT CALLED TO, READ BACK BY AND VERIFIED WITH: PHARMD T BAUMEISTER [phone] MLM    Culture (A)  Final     STAPHYLOCOCCUS SPECIES (COAGULASE NEGATIVE) THE SIGNIFICANCE OF ISOLATING THIS ORGANISM FROM A SINGLE SET OF BLOOD CULTURES WHEN MULTIPLE SETS ARE DRAWN IS UNCERTAIN. PLEASE NOTIFY THE MICROBIOLOGY DEPARTMENT WITHIN ONE WEEK IF SPECIATION AND SENSITIVITIES ARE REQUIRED. Performed at [facility], [address] [place], [place] [address]    Report Status 11/15/2018 FINAL  Final  Blood Culture ID Panel (Reflexed)     Status: Abnormal   Collection Time: 11/12/18  2:10 AM  Result Value Ref Range Status   Enterococcus species NOT DETECTED NOT DETECTED Final   Listeria monocytogenes NOT DETECTED NOT DETECTED Final   Staphylococcus species DETECTED (A) NOT DETECTED Final    Comment: Methicillin (oxacillin) susceptible coagulase negative staphylococcus. Possible blood culture contaminant (unless isolated from more than one blood culture draw or clinical case suggests pathogenicity). No antibiotic treatment is indicated for blood  culture contaminants. CRITICAL RESULT CALLED TO, READ BACK BY AND VERIFIED WITH: PHARMD T BAUMEISTER [phone] MLM    Staphylococcus aureus (BCID) NOT DETECTED NOT DETECTED Final   Methicillin resistance NOT DETECTED NOT DETECTED Final   Streptococcus species NOT DETECTED NOT DETECTED Final   Streptococcus agalactiae NOT DETECTED NOT DETECTED Final   Streptococcus pneumoniae NOT DETECTED NOT DETECTED Final   Streptococcus pyogenes NOT DETECTED NOT DETECTED Final   Acinetobacter baumannii NOT DETECTED NOT DETECTED Final   Enterobacteriaceae species NOT DETECTED NOT DETECTED Final   Enterobacter cloacae complex NOT DETECTED NOT DETECTED Final   Escherichia coli NOT DETECTED NOT DETECTED Final   Klebsiella oxytoca NOT DETECTED NOT DETECTED Final   Klebsiella pneumoniae NOT DETECTED NOT DETECTED Final   Proteus species NOT DETECTED NOT DETECTED Final   Serratia marcescens NOT DETECTED NOT DETECTED Final   Haemophilus influenzae NOT DETECTED NOT DETECTED Final    Neisseria meningitidis NOT DETECTED NOT DETECTED Final   Pseudomonas aeruginosa NOT DETECTED NOT DETECTED Final   Candida albicans NOT DETECTED NOT DETECTED Final   Candida glabrata NOT DETECTED NOT DETECTED Final   Candida krusei NOT DETECTED NOT DETECTED Final   Candida parapsilosis NOT DETECTED NOT DETECTED Final   Candida tropicalis NOT DETECTED NOT DETECTED Final    Comment: Performed at [facility], [address] [place], [place] [address]  SARS CORONAVIRUS 2 (TAT 6-24 HRS) Nasopharyngeal Nasopharyngeal Swab     Status: None   Collection Time: 11/12/18  5:47 AM   Specimen: Nasopharyngeal Swab  Result Value Ref Range Status   SARS Coronavirus 2 NEGATIVE NEGATIVE Final    Comment: (NOTE) SARS-CoV-2 target nucleic acids are NOT DETECTED. The SARS-CoV-2 RNA  is generally detectable in upper and lower respiratory specimens during the acute phase of infection. Negative results do not preclude SARS-CoV-2 infection, do not rule out co-infections with other pathogens, and should not be used as the sole basis for treatment or other patient management decisions. Negative results must be combined with clinical observations, patient history, and epidemiological information. The expected result is Negative. Fact Sheet for Patients: HairSlick.nohttps://www.fda.gov/media/138098/download Fact Sheet for Healthcare Providers: quierodirigir.comhttps://www.fda.gov/media/138095/download This test is not yet approved or cleared by the [place] FDA and  has been authorized for detection and/or diagnosis of SARS-CoV-2 by FDA under an Emergency Use Authorization (EUA). This EUA will remain  in effect (meaning this test can be used) for the duration of the COVID-19 declaration under Section 56 4(b)(1) of the Act, 21 U.S.C. section 360bbb-3(b)(1), unless the authorization is terminated or revoked sooner. Performed at [facility], [address] [place], [place] [address]   Culture, blood (routine x 2)      Status: None (Preliminary result)   Collection Time: 11/12/18  7:02 PM   Specimen: BLOOD LEFT HAND  Result Value Ref Range Status   Specimen Description BLOOD LEFT HAND  Final   Special Requests   Final    BOTTLES DRAWN AEROBIC ONLY Blood Culture adequate volume   Culture   Final    NO GROWTH 4 DAYS Performed at [facility], [address] [place], [place] [address]    Report Status PENDING  Incomplete     Labs: Basic Metabolic Panel: Recent Labs  Lab 11/12/18 0210 11/13/18 0009 11/13/18 0811 11/14/18 0304 11/15/18 0732  NA 131* 130* 129* 130* 130*  K 4.5 3.7 3.6 3.7 3.7  CL 97* 98 96* 100 97*  CO2 18* 20* 21* 22 22  GLUCOSE 156* 118* 108* 107* 107*  BUN 21 8 7* 12 14  CREATININE 1.01* 0.77 0.70 0.85 0.75  CALCIUM 9.4 9.0 8.9 8.7* 8.9   Liver Function Tests: Recent Labs  Lab 11/12/18 0210 11/14/18 0304  AST 26 26  ALT 16 15  ALKPHOS 42 35*  BILITOT 0.6 0.5  PROT 7.1 5.7*  ALBUMIN 3.9 2.9*   Recent Labs  Lab 11/12/18 0210  LIPASE 25   No results for input(s): AMMONIA in the last 168 hours. CBC: Recent Labs  Lab 11/12/18 0210 11/12/18 1345 11/13/18 0009  WBC 9.6  --  8.4  NEUTROABS 6.2  --   --   HGB 12.3  --  12.5  HCT 37.0 34.5 35.2*  MCV 103.4*  --  99.7  PLT 262  --  231   Cardiac Enzymes: No results for input(s): CKTOTAL, CKMB, CKMBINDEX, TROPONINI in the last 168 hours. BNP: BNP (last 3 results) Recent Labs    11/12/18 0210  BNP 65.8    ProBNP (last 3 results) No results for input(s): PROBNP in the last 8760 hours.  CBG: Recent Labs  Lab 11/12/18 0208  GLUCAP 145*       Signed:  Darlin Droparole N Evangelia Whitaker, MD [facility] 11/16/2018, 12:35 PM

## 2018-11-16 NOTE — Progress Notes (Signed)
Initial Nutrition Assessment  DOCUMENTATION CODES:   Not applicable  INTERVENTION:   Ensure Enlive po BID, each supplement provides 350 kcal and 20 grams of protein  Recommend MVI daily  NUTRITION DIAGNOSIS:   Inadequate oral intake related to acute illness as evidenced by meal completion < 50%.  GOAL:   Patient will meet greater than or equal to 90% of their needs  MONITOR:   PO intake, Supplement acceptance, Labs, Weight trends, Skin, I & O's  REASON FOR ASSESSMENT:   Consult Assessment of nutrition requirement/status  ASSESSMENT:   85 y.o. female with past medical history significant for HTN, LBBB, and GERD who presents to the ED via EMS from [facility] center for acute onset neck and back pain as well as vomiting. In ED became orthostatic with 3 minutes of standing and found to have elevated troponing levels. Pt found to have bilateral hydronephrosis  RD working remotely.  Pt with poor appetite and oral intake pta r/t nausea and vomiting. Nausea and vomiting have resolved. Pt with fair appetite and oral intake in hospital; pt eating anywhere from 25-50% of meals in hospital. Pt ordered for Ensure today. Recommend MVI daily to help pt meet her estimated needs. Per chart, pt is fairly weight stable pta. Pt awaiting insurance authorization for discharge.   Medications reviewed and include: aspirin, lovenox, synthroid, protonix, B12  Labs reviewed: Na 130(L)  Unable to complete Nutrition-Focused physical exam at this time.   Diet Order:   Diet Order            DIET DYS 3 Room service appropriate? Yes; Fluid consistency: Thin  Diet effective now             EDUCATION NEEDS:   No education needs have been identified at this time  Skin:  Skin Assessment: Reviewed RN Assessment  Last BM:  10/3- type 2  Height:   Ht Readings from Last 1 Encounters:  11/16/18 5\' 3"  (1.6 m)    Weight:   Wt Readings from Last 1 Encounters:  11/16/18 65.3 kg     Ideal Body Weight:  56.3 kg  BMI:  Body mass index is 25.5 kg/m.  Estimated Nutritional Needs:   Kcal:  1400-1600kcal/day  Protein:  70-80g/day  Fluid:  >1.4L/day  Koleen Distance MS, RD, LDN Pager #- [phone] Office#- [phone] After Hours Pager: [phone]

## 2018-11-17 ENCOUNTER — Inpatient Hospital Stay (HOSPITAL_COMMUNITY): Payer: Medicare Other

## 2018-11-17 LAB — BASIC METABOLIC PANEL
Anion gap: 8 (ref 5–15)
BUN: 20 mg/dL (ref 8–23)
CO2: 24 mmol/L (ref 22–32)
Calcium: 8.7 mg/dL — ABNORMAL LOW (ref 8.9–10.3)
Chloride: 99 mmol/L (ref 98–111)
Creatinine, Ser: 0.8 mg/dL (ref 0.44–1.00)
GFR calc Af Amer: >60 mL/min (ref >60)
GFR calc non Af Amer: >60 mL/min (ref >60)
Glucose, Bld: 136 mg/dL — ABNORMAL HIGH (ref 70–99)
Potassium: 4.1 mmol/L (ref 3.5–5.1)
Sodium: 131 mmol/L — ABNORMAL LOW (ref 135–145)

## 2018-11-17 LAB — COMPREHENSIVE METABOLIC PANEL
ALT: 25 U/L (ref 0–44)
AST: 32 U/L (ref 15–41)
Albumin: 3.3 g/dL — ABNORMAL LOW (ref 3.5–5.0)
Alkaline Phosphatase: 50 U/L (ref 38–126)
Anion gap: 10 (ref 5–15)
BUN: 19 mg/dL (ref 8–23)
CO2: 27 mmol/L (ref 22–32)
Calcium: 9.4 mg/dL (ref 8.9–10.3)
Chloride: 94 mmol/L — ABNORMAL LOW (ref 98–111)
Creatinine, Ser: 0.81 mg/dL (ref 0.44–1.00)
GFR calc Af Amer: >60 mL/min (ref >60)
GFR calc non Af Amer: >60 mL/min (ref >60)
Glucose, Bld: 125 mg/dL — ABNORMAL HIGH (ref 70–99)
Potassium: 4.9 mmol/L (ref 3.5–5.1)
Sodium: 131 mmol/L — ABNORMAL LOW (ref 135–145)
Total Bilirubin: 0.8 mg/dL (ref 0.3–1.2)
Total Protein: 7.1 g/dL (ref 6.5–8.1)

## 2018-11-17 LAB — CBC WITH DIFFERENTIAL/PLATELET
Abs Immature Granulocytes: 0.1 10*3/uL — ABNORMAL HIGH (ref 0.00–0.07)
Basophils Absolute: 0.1 10*3/uL (ref 0.0–0.1)
Basophils Relative: 0 %
Eosinophils Absolute: 0.1 10*3/uL (ref 0.0–0.5)
Eosinophils Relative: 1 %
HCT: 38.9 % (ref 36.0–46.0)
Hemoglobin: 13.5 g/dL (ref 12.0–15.0)
Immature Granulocytes: 1 %
Lymphocytes Relative: 11 %
Lymphs Abs: 1.5 10*3/uL (ref 0.7–4.0)
MCH: 35.2 pg — ABNORMAL HIGH (ref 26.0–34.0)
MCHC: 34.7 g/dL (ref 30.0–36.0)
MCV: 101.3 fL — ABNORMAL HIGH (ref 80.0–100.0)
Monocytes Absolute: 0.9 10*3/uL (ref 0.1–1.0)
Monocytes Relative: 7 %
Neutro Abs: 10.4 10*3/uL — ABNORMAL HIGH (ref 1.7–7.7)
Neutrophils Relative %: 80 %
Platelets: 295 10*3/uL (ref 150–400)
RBC: 3.84 MIL/uL — ABNORMAL LOW (ref 3.87–5.11)
RDW: 12.4 % (ref 11.5–15.5)
WBC: 13 10*3/uL — ABNORMAL HIGH (ref 4.0–10.5)
nRBC: 0 % (ref 0.0–0.2)

## 2018-11-17 LAB — NOVEL CORONAVIRUS, NAA (HOSP ORDER, SEND-OUT TO REF LAB; TAT 18-24 HRS): SARS-CoV-2, NAA: NOT DETECTED

## 2018-11-17 LAB — TROPONIN I (HIGH SENSITIVITY)
Troponin I (High Sensitivity): 16 ng/L (ref <18)
Troponin I (High Sensitivity): 33 ng/L — ABNORMAL HIGH (ref <18)

## 2018-11-17 LAB — LACTIC ACID, PLASMA
Lactic Acid, Venous: 2.1 mmol/L (ref 0.5–1.9)
Lactic Acid, Venous: 3.3 mmol/L (ref 0.5–1.9)

## 2018-11-17 LAB — BLOOD GAS, ARTERIAL
Acid-Base Excess: 1.6 mmol/L (ref 0.0–2.0)
Bicarbonate: 24.9 mmol/L (ref 20.0–28.0)
Drawn by: 24610
O2 Content: 6 L/min
O2 Saturation: 95.6 %
Patient temperature: 97.7
pCO2 arterial: 33.2 mmHg (ref 32.0–48.0)
pH, Arterial: 7.484 — ABNORMAL HIGH (ref 7.350–7.450)
pO2, Arterial: 72.4 mmHg — ABNORMAL LOW (ref 83.0–108.0)

## 2018-11-17 LAB — CULTURE, BLOOD (ROUTINE X 2)
Culture: NO GROWTH
Special Requests: ADEQUATE

## 2018-11-17 LAB — PROCALCITONIN: Procalcitonin: <0.1 ng/mL

## 2018-11-17 IMAGING — CT CT CHEST W/O CM
2 of 4 series · 13 of 36 positions shown, 16 images · non-contrast
Comparison: Chest CTA [DATE]. CT the abdomen and pelvis
[DATE].

CLINICAL DATA: 86-year-old female with history of chest and back
pain. Suspected acute aortic dissection.

EXAM:
CT CHEST, ABDOMEN AND PELVIS WITHOUT CONTRAST
TECHNIQUE: Multidetector CT imaging of the chest, abdomen and pelvis was
performed following the standard protocol without IV contrast.

[Series 3: cap wo 5.0 i31f 2 · axial · 0.77mm/px · z∈[+862,+1362]mm · 10 of 122 slices shown, 13 images]
[im 11/122  mediastinal]
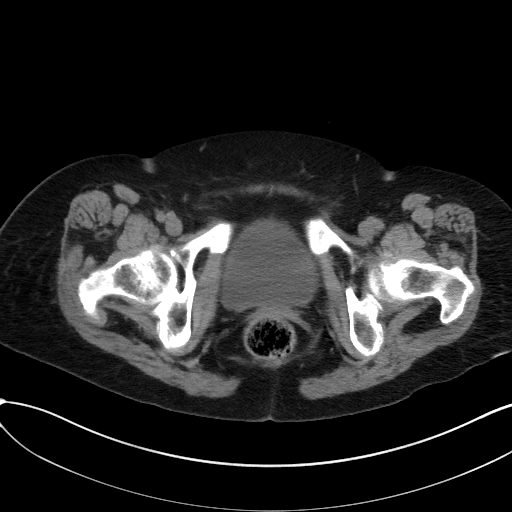
[im 11/122  lung]
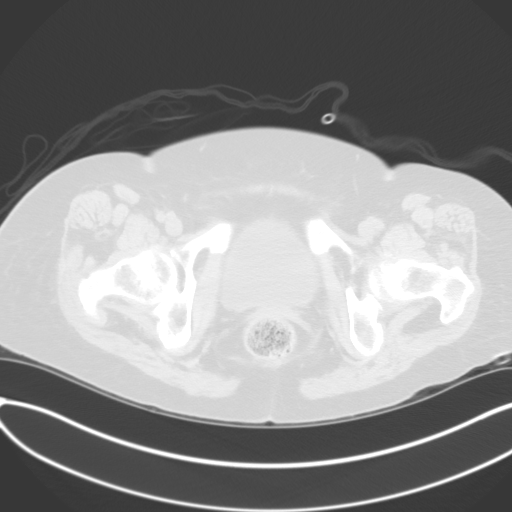
[im 21/122  lung]
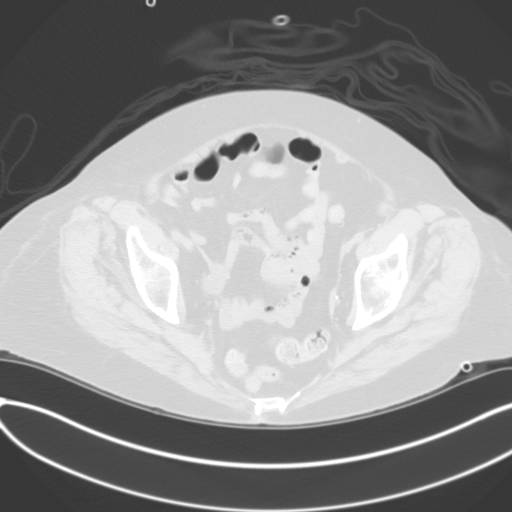
[im 31/122  lung]
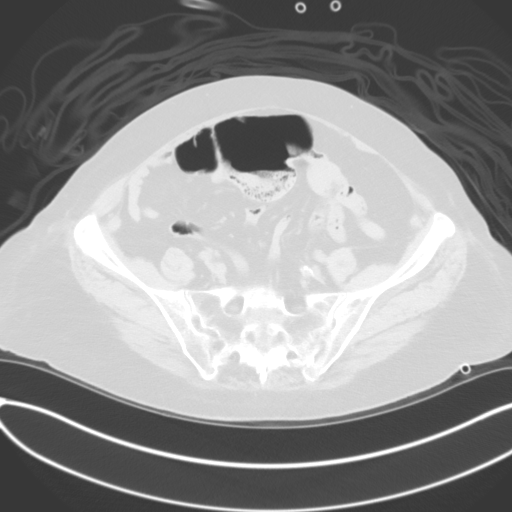
[im 41/122  lung]
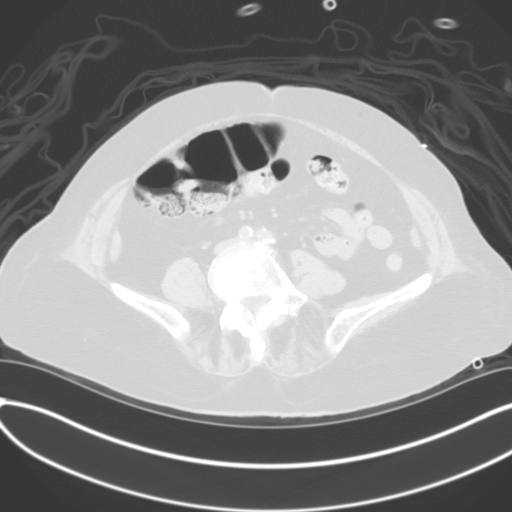
[im 51/122  mediastinal]
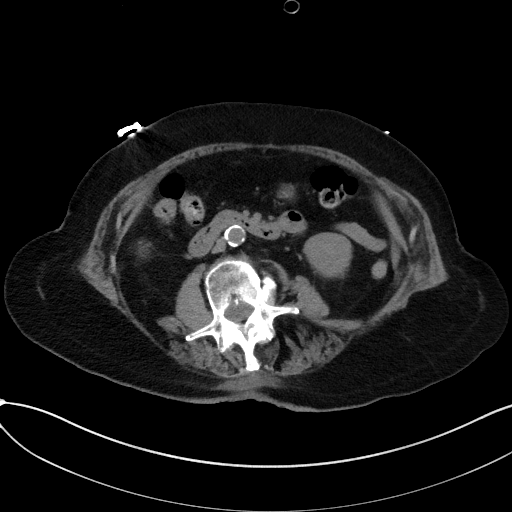
[im 51/122  lung]
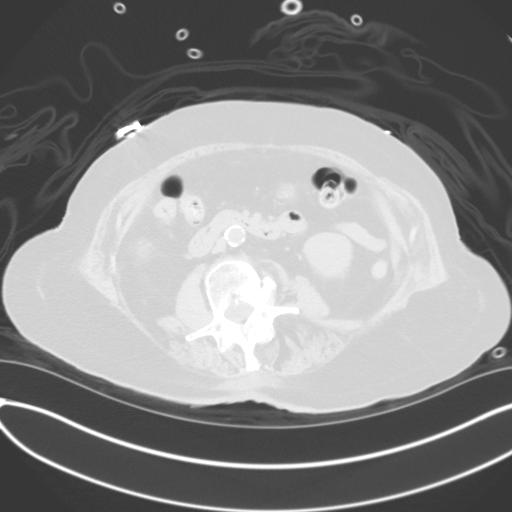
[im 71/122  lung]
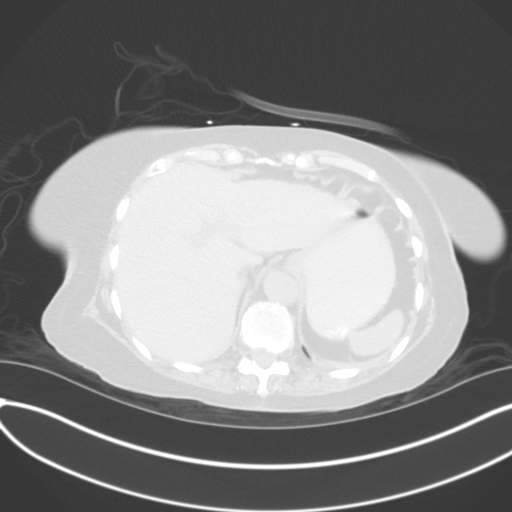
[im 81/122  lung]
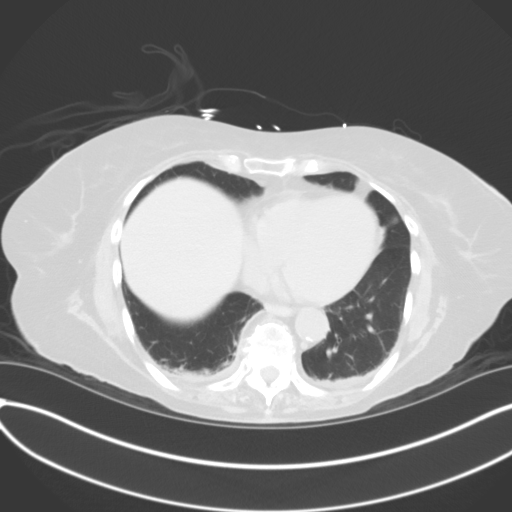
[im 91/122  lung]
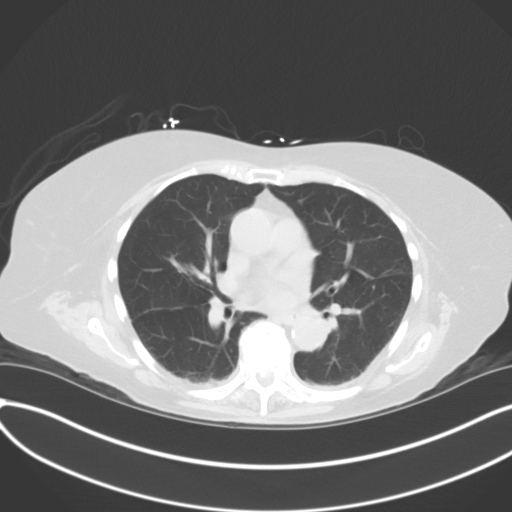
[im 101/122  mediastinal]
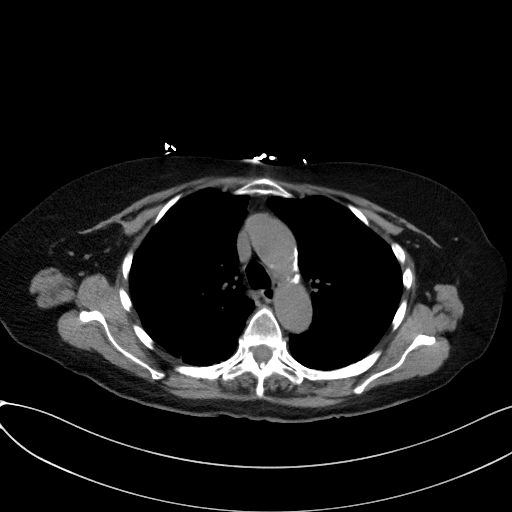
[im 101/122  lung]
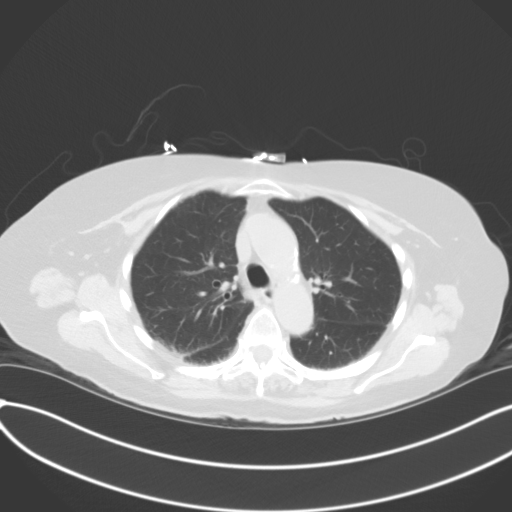
[im 111/122  lung]
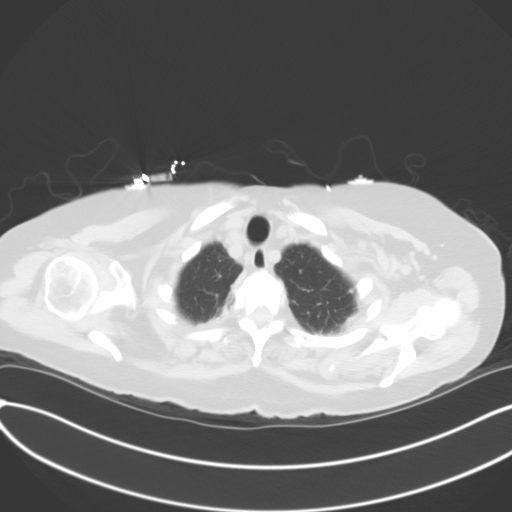

[Series 6: coronal · coronal · 0.86mm/px · 3 of 121 slices shown]
[im 25/121  lung]
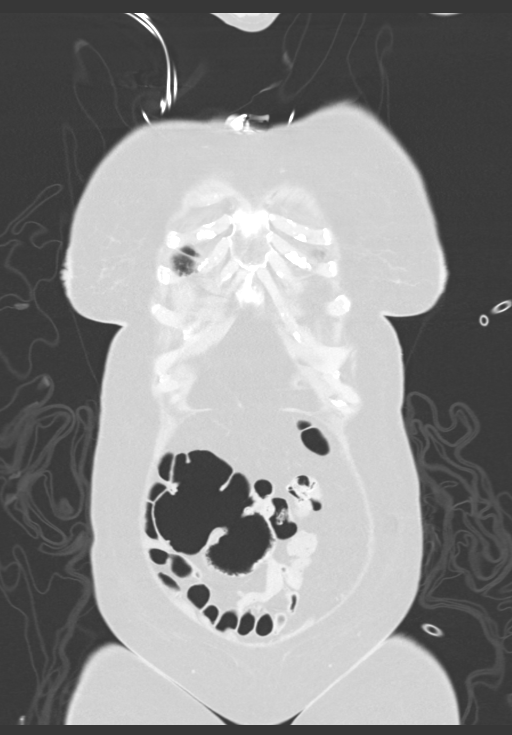
[im 49/121  lung]
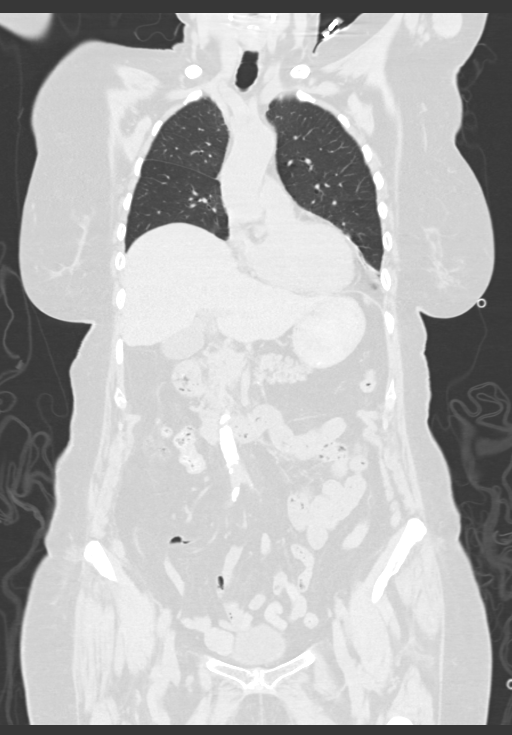
[im 73/121  lung]
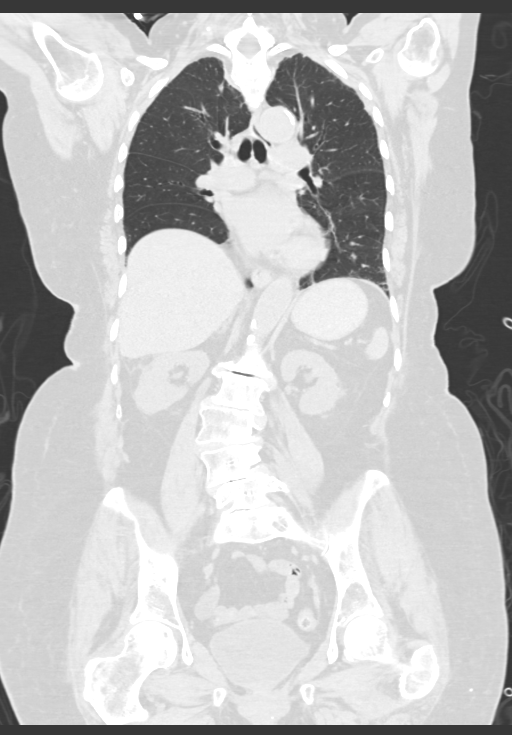

[13 of 36 positions shown; findings below may reference images not displayed]

FINDINGS: CT CHEST FINDINGS

Cardiovascular: Heart size is normal. There is no significant
pericardial fluid, thickening or pericardial calcification. There is
aortic atherosclerosis, as well as atherosclerosis of the great
vessels of the mediastinum and the coronary arteries, including
calcified atherosclerotic plaque in the right coronary artery. No
crescentic high attenuation associated with the wall of the thoracic
aorta to suggest the presence of acute intramural hemorrhage.
Accurate assessment for aortic dissection is not possible on today's
noncontrast examination.

Mediastinum/Nodes: No pathologically enlarged mediastinal or hilar
lymph nodes. Please note that accurate exclusion of hilar adenopathy
is limited on noncontrast CT scans. Esophagus is unremarkable in
appearance. No axillary lymphadenopathy.

Lungs/Pleura: No acute consolidative airspace disease. No pleural
effusions. Multiple linear areas of architectural distortion
throughout the lungs bilaterally, favored to reflect areas of
chronic post infectious or inflammatory scarring. No suspicious
appearing pulmonary nodules or masses are noted.

Musculoskeletal: There are no aggressive appearing lytic or blastic
lesions noted in the visualized portions of the skeleton.

CT ABDOMEN PELVIS FINDINGS

Hepatobiliary: No definite suspicious cystic or solid hepatic
lesions are confidently identified on today's noncontrast CT
examination. Unenhanced appearance of the gallbladder is normal.

Pancreas: No definite pancreatic mass or peripancreatic fluid
collections or inflammatory changes are noted on today's noncontrast
examination.

Spleen: Unremarkable.

Adrenals/Urinary Tract: 2.6 cm exophytic low-attenuation lesion in
upper pole of the left kidney, incompletely characterized on today's
non-contrast CT examination, but statistically likely to represent a
cyst. Unenhanced appearance of the right kidney and bilateral
adrenal glands is normal. No hydroureteronephrosis. Urinary bladder
is unremarkable in appearance.

Stomach/Bowel: Unenhanced appearance of the stomach is normal. No
pathologic dilatation of small bowel or colon. The appendix is not
confidently identified and may be surgically absent. Regardless,
there are no inflammatory changes noted adjacent to the cecum to
suggest the presence of an acute appendicitis at this time.

Vascular/Lymphatic: Aortic atherosclerosis. No crescentic high
attenuation associated with the wall of the abdominal aorta to
suggest acute intramural hematoma. No lymphadenopathy noted in the
abdomen or pelvis.

Reproductive: Status post hysterectomy. Ovaries are not confidently
identified and may be surgically absent or atrophic.

Other: No significant volume of ascites.  No pneumoperitoneum.

Musculoskeletal: There are no aggressive appearing lytic or blastic
lesions noted in the visualized portions of the skeleton.
IMPRESSION: 1. No definitive imaging findings on today's noncontrast CT
examination to suggest acute aortic syndrome.
2. No acute findings are noted in the chest, abdomen or pelvis to
account for the patient's symptoms.
3. Aortic atherosclerosis, in addition to right coronary artery
disease.
4. Additional incidental findings, as above.

## 2018-11-17 IMAGING — CT CT ABD-PELV W/O CM
2 of 4 series · 13 of 36 positions shown, 16 images · non-contrast
Comparison: Chest CTA [DATE]. CT the abdomen and pelvis
[DATE].

CLINICAL DATA: 86-year-old female with history of chest and back
pain. Suspected acute aortic dissection.

EXAM:
CT CHEST, ABDOMEN AND PELVIS WITHOUT CONTRAST
TECHNIQUE: Multidetector CT imaging of the chest, abdomen and pelvis was
performed following the standard protocol without IV contrast.

[Series 1: cap wo 5.0 i31f 2 · axial · 0.77mm/px · z∈[+862,+1362]mm · 10 of 122 slices shown, 13 images]
[im 11/122  mediastinal]
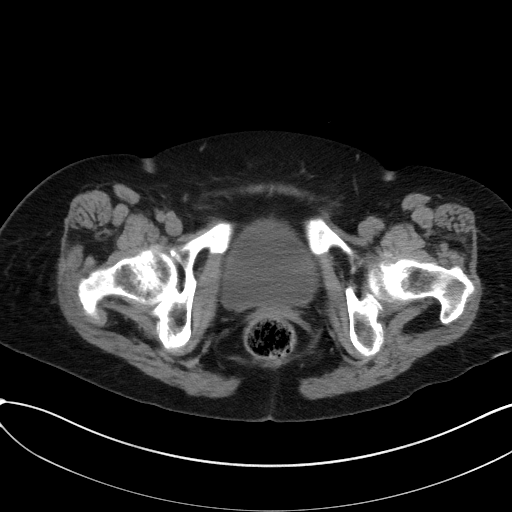
[im 11/122  lung]
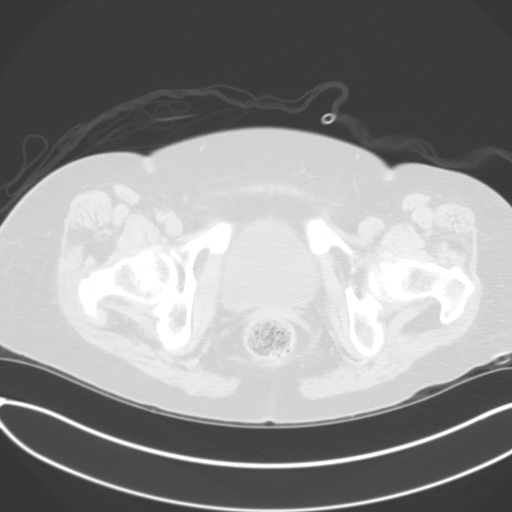
[im 21/122  lung]
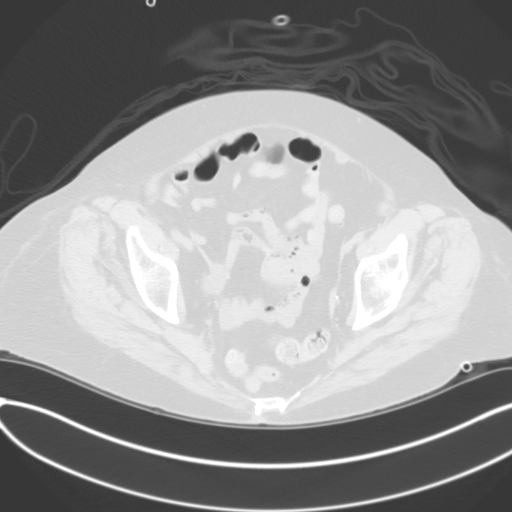
[im 31/122  lung]
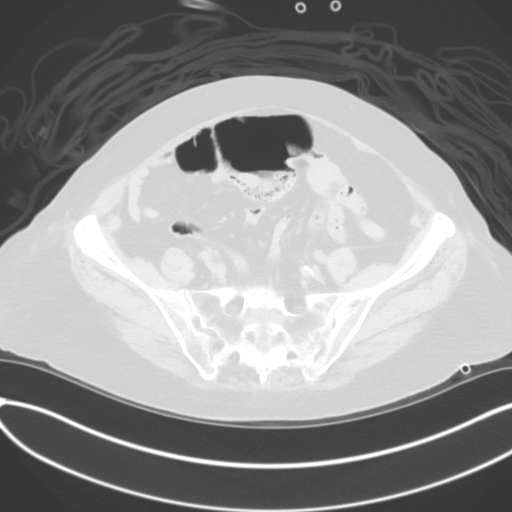
[im 41/122  lung]
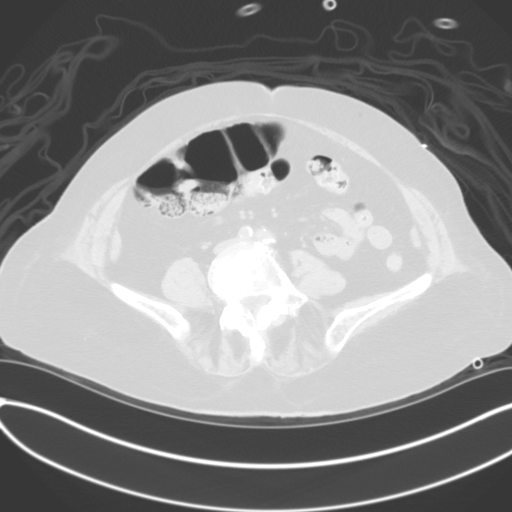
[im 51/122  mediastinal]
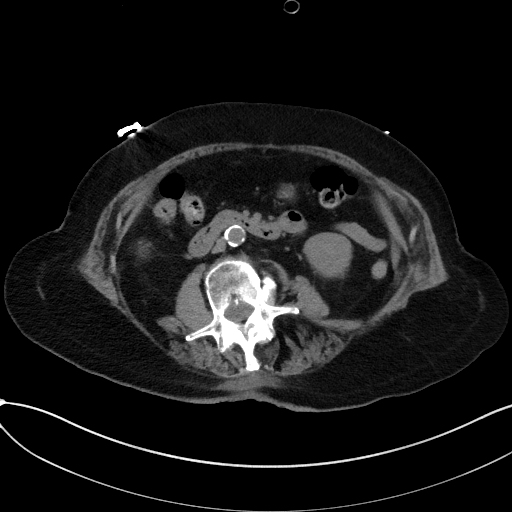
[im 51/122  lung]
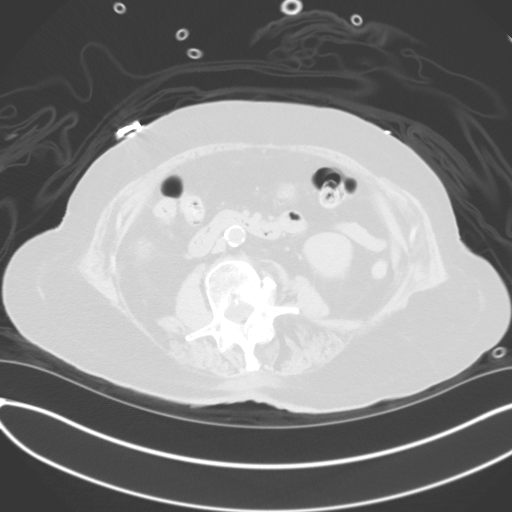
[im 71/122  lung]
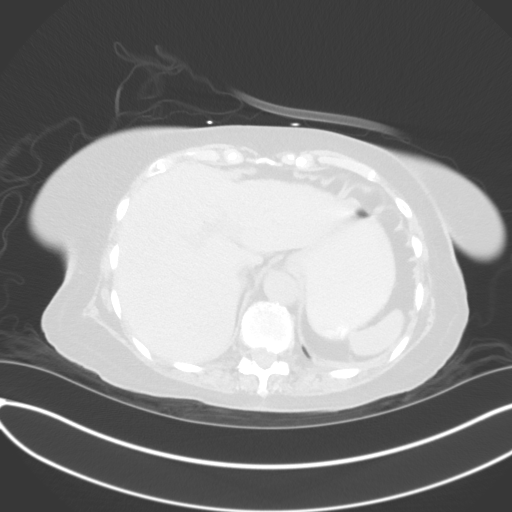
[im 81/122  lung]
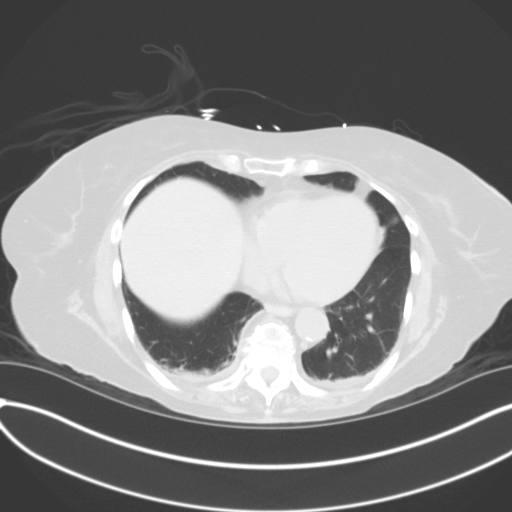
[im 91/122  lung]
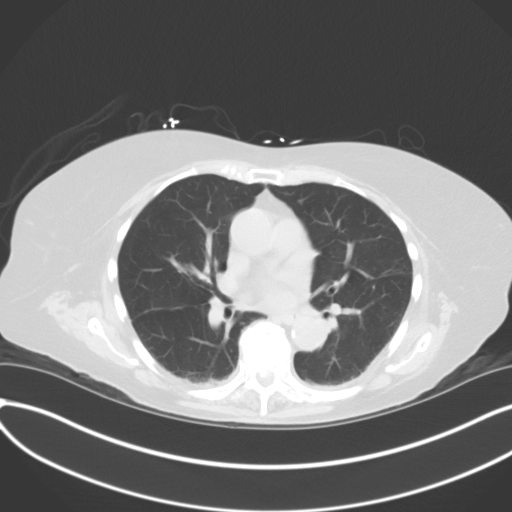
[im 101/122  mediastinal]
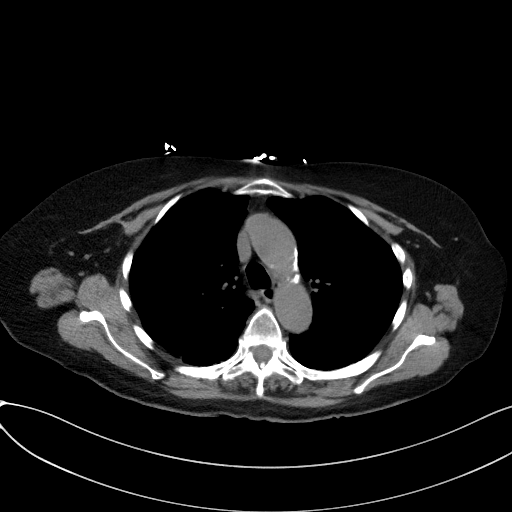
[im 101/122  lung]
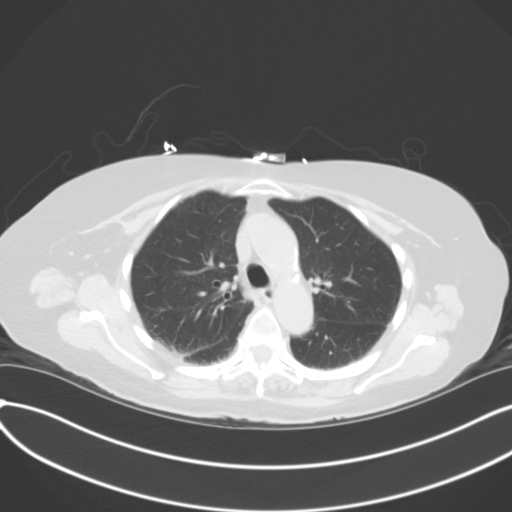
[im 111/122  lung]
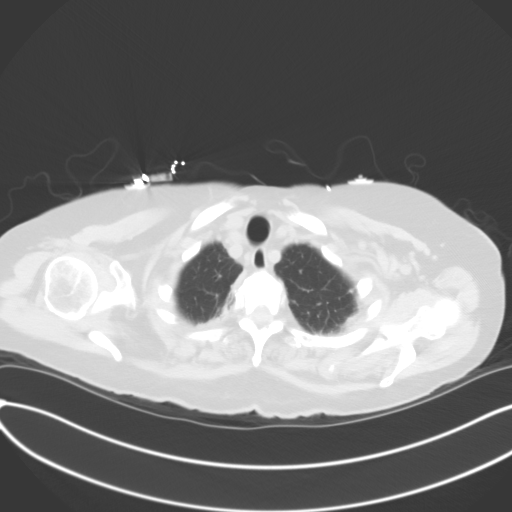

[Series 5: coronal · coronal · 0.86mm/px · 3 of 121 slices shown]
[im 25/121  lung]
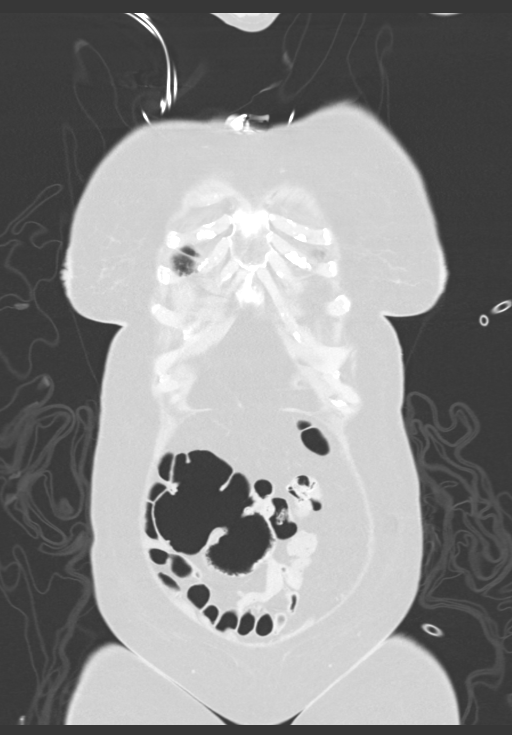
[im 49/121  lung]
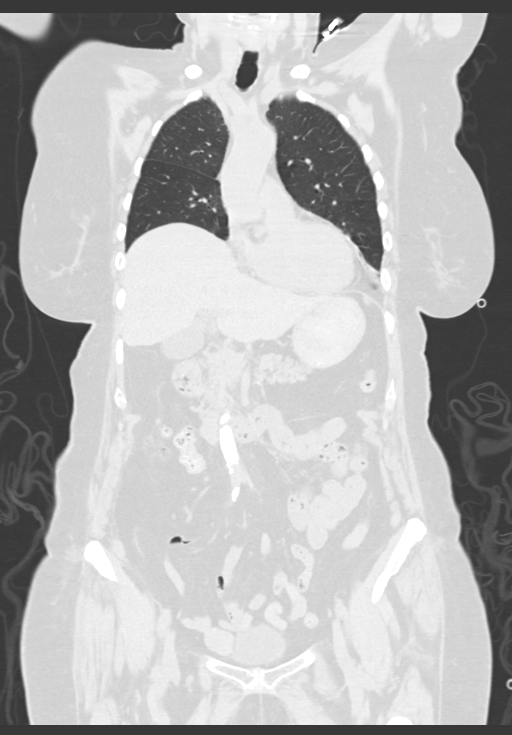
[im 73/121  lung]
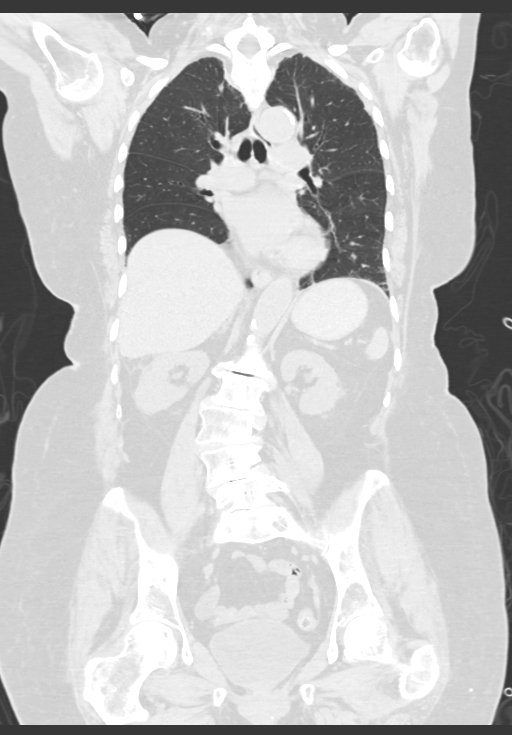

[13 of 36 positions shown; findings below may reference images not displayed]

FINDINGS: CT CHEST FINDINGS

Cardiovascular: Heart size is normal. There is no significant
pericardial fluid, thickening or pericardial calcification. There is
aortic atherosclerosis, as well as atherosclerosis of the great
vessels of the mediastinum and the coronary arteries, including
calcified atherosclerotic plaque in the right coronary artery. No
crescentic high attenuation associated with the wall of the thoracic
aorta to suggest the presence of acute intramural hemorrhage.
Accurate assessment for aortic dissection is not possible on today's
noncontrast examination.

Mediastinum/Nodes: No pathologically enlarged mediastinal or hilar
lymph nodes. Please note that accurate exclusion of hilar adenopathy
is limited on noncontrast CT scans. Esophagus is unremarkable in
appearance. No axillary lymphadenopathy.

Lungs/Pleura: No acute consolidative airspace disease. No pleural
effusions. Multiple linear areas of architectural distortion
throughout the lungs bilaterally, favored to reflect areas of
chronic post infectious or inflammatory scarring. No suspicious
appearing pulmonary nodules or masses are noted.

Musculoskeletal: There are no aggressive appearing lytic or blastic
lesions noted in the visualized portions of the skeleton.

CT ABDOMEN PELVIS FINDINGS

Hepatobiliary: No definite suspicious cystic or solid hepatic
lesions are confidently identified on today's noncontrast CT
examination. Unenhanced appearance of the gallbladder is normal.

Pancreas: No definite pancreatic mass or peripancreatic fluid
collections or inflammatory changes are noted on today's noncontrast
examination.

Spleen: Unremarkable.

Adrenals/Urinary Tract: 2.6 cm exophytic low-attenuation lesion in
upper pole of the left kidney, incompletely characterized on today's
non-contrast CT examination, but statistically likely to represent a
cyst. Unenhanced appearance of the right kidney and bilateral
adrenal glands is normal. No hydroureteronephrosis. Urinary bladder
is unremarkable in appearance.

Stomach/Bowel: Unenhanced appearance of the stomach is normal. No
pathologic dilatation of small bowel or colon. The appendix is not
confidently identified and may be surgically absent. Regardless,
there are no inflammatory changes noted adjacent to the cecum to
suggest the presence of an acute appendicitis at this time.

Vascular/Lymphatic: Aortic atherosclerosis. No crescentic high
attenuation associated with the wall of the abdominal aorta to
suggest acute intramural hematoma. No lymphadenopathy noted in the
abdomen or pelvis.

Reproductive: Status post hysterectomy. Ovaries are not confidently
identified and may be surgically absent or atrophic.

Other: No significant volume of ascites.  No pneumoperitoneum.

Musculoskeletal: There are no aggressive appearing lytic or blastic
lesions noted in the visualized portions of the skeleton.
IMPRESSION: 1. No definitive imaging findings on today's noncontrast CT
examination to suggest acute aortic syndrome.
2. No acute findings are noted in the chest, abdomen or pelvis to
account for the patient's symptoms.
3. Aortic atherosclerosis, in addition to right coronary artery
disease.
4. Additional incidental findings, as above.

## 2018-11-17 MED ORDER — SENNOSIDES-DOCUSATE SODIUM 8.6-50 MG PO TABS
2.0000 | ORAL_TABLET | Freq: Two times a day (BID) | ORAL | Status: DC
  Administered 2018-11-17 – 2018-11-28 (×19): 2 via ORAL
  Filled 2018-11-17 (×22): qty 2

## 2018-11-17 MED ORDER — LABETALOL HCL 5 MG/ML IV SOLN
5.0000 mg | Freq: Once | INTRAVENOUS | Status: AC
  Administered 2018-11-17: 18:00:00 5 mg via INTRAVENOUS
  Filled 2018-11-17: qty 4

## 2018-11-17 MED ORDER — ALUM & MAG HYDROXIDE-SIMETH 200-200-20 MG/5ML PO SUSP
30.0000 mL | Freq: Once | ORAL | Status: AC
  Administered 2018-11-17: 30 mL via ORAL
  Filled 2018-11-17: qty 30

## 2018-11-17 MED ORDER — SODIUM CHLORIDE 0.9 % IV BOLUS
500.0000 mL | Freq: Once | INTRAVENOUS | Status: AC
  Administered 2018-11-17: 500 mL via INTRAVENOUS

## 2018-11-17 MED ORDER — NALOXONE HCL 0.4 MG/ML IJ SOLN: 0.4000 mg | INTRAMUSCULAR | Status: DC | PRN

## 2018-11-17 MED ORDER — LABETALOL HCL 5 MG/ML IV SOLN: 5.0000 mg | INTRAVENOUS | Status: DC | PRN

## 2018-11-17 MED ORDER — SODIUM CHLORIDE 0.9 % IV SOLN
INTRAVENOUS | Status: AC
  Administered 2018-11-17: 18:00:00 via INTRAVENOUS

## 2018-11-17 MED ORDER — LIDOCAINE 5 % EX PTCH
1.0000 | MEDICATED_PATCH | CUTANEOUS | Status: DC
  Administered 2018-11-17 – 2018-11-28 (×11): 1 via TRANSDERMAL
  Filled 2018-11-17 (×12): qty 1

## 2018-11-17 MED ORDER — LIDOCAINE 5 % EX PTCH: 1.0000 | MEDICATED_PATCH | CUTANEOUS | 0 refills | Status: AC

## 2018-11-17 MED ORDER — SENNOSIDES-DOCUSATE SODIUM 8.6-50 MG PO TABS: 2.0000 | ORAL_TABLET | Freq: Two times a day (BID) | ORAL | 0 refills | Status: DC

## 2018-11-17 MED ORDER — TRAMADOL HCL 50 MG PO TABS
50.0000 mg | ORAL_TABLET | Freq: Four times a day (QID) | ORAL | Status: DC | PRN
  Administered 2018-11-17 – 2018-11-24 (×8): 50 mg via ORAL
  Filled 2018-11-17 (×8): qty 1

## 2018-11-17 MED ORDER — LIDOCAINE VISCOUS HCL 2 % MT SOLN
15.0000 mL | Freq: Once | OROMUCOSAL | Status: AC
  Administered 2018-11-17: 18:00:00 15 mL via ORAL
  Filled 2018-11-17: qty 15

## 2018-11-17 MED ORDER — TRAMADOL HCL 50 MG PO TABS: 50.0000 mg | ORAL_TABLET | Freq: Every day | ORAL | 0 refills | Status: DC | PRN

## 2018-11-17 MED ORDER — METHOCARBAMOL 1000 MG/10ML IJ SOLN
500.0000 mg | Freq: Three times a day (TID) | INTRAVENOUS | Status: DC | PRN
  Administered 2018-11-17: 20:00:00 500 mg via INTRAVENOUS
  Filled 2018-11-17 (×5): qty 5

## 2018-11-17 MED ORDER — HYDROMORPHONE HCL 1 MG/ML IJ SOLN
0.5000 mg | INTRAMUSCULAR | Status: DC | PRN
  Administered 2018-11-17 – 2018-11-18 (×2): 0.5 mg via INTRAVENOUS
  Filled 2018-11-17 (×2): qty 1

## 2018-11-17 NOTE — Progress Notes (Signed)
CRITICAL VALUE ALERT  Critical Value:  Lactic acid 2.1  Date & Time Notied: 11/17/18 at [place]  Provider Notified: Dr. Nevada Crane  Orders Received/Actions taken: awaiting return of call

## 2018-11-17 NOTE — Progress Notes (Signed)
Patient has had persistent back pain today that has not been relieved by previous measures when I notified Dr. Nevada Crane earlier today. Patient unable to roll from side to side without crying. She will not allow me to bring her head of bed up anymore than 30 degrees without crying. I notified Dr. Nevada Crane. Patient denies stomach or back pain. Denies radiation. States that the pain is only in her mid to lower back.

## 2018-11-17 NOTE — Progress Notes (Signed)
Late entry: I went down to CT with patient and shortly after arriving to the floor, patient became tachycardic in the 120s, BP 195/103, temp 97.7, shaking in her arms and still complaining of 10/10 back pain. I had a hard time getting her oxygen saturation to register, she was a bit short of breath, resp rate in 26, oxygen applied at [place]. Rapid response busy with another patient. I called Dr. Nevada Crane. Notified her of above situation and she stayed on the phone with me giving me orders. EKG done, GI cocktail given, ABG done, IVF started, IV labetalol given. Patient alert and oriented, only complaint is sever back pain. Dr. Nevada Crane to call me back once reviewing everything

## 2018-11-17 NOTE — Progress Notes (Signed)
Did bladder scan as ordered and nothing but 15 cc but abdomen is firm and round which was not like this earlier. I specifically notate this because I have cleaned  (peri care) her an changed gown and tried to get her comfortable several times today. I notified Dr. Nevada Crane and she immediately came to bedside and orders received. Will closely monitor

## 2018-11-17 NOTE — Progress Notes (Signed)
Pt continues to report 10/10 low back pain despite all measures/medications.  Pt able to move all extremities.  Abd also noted to be distended.  Bladder scan reveal 287 ml.  Prior shift reports pt urinating well.  Pt reports pain is similar to admission but worse.  BP 14/70, HR 107 ST RR 24. Pt afrebrile.  MD paged via amion x 2.  Awating further orders.

## 2018-11-17 NOTE — Progress Notes (Signed)
Discharge Summary  NECHOLE LISON YQI:[id number] DOB: 07-27-1932  PCP: Merlene Laughter, MD  Admit date: 11/12/2018 Discharge date: 11/17/2018  Time spent: 35 minutes  Recommendations for Outpatient Follow-up:  1. Follow-up with GI 2. Follow-up with cardiology 3. Follow-up with your PCP 4. Follow-up with urology, please obtain urology referral from your PCP. 5. Take your medications as prescribed 6. Continue physical therapy 7. Fall precautions  Discharge delayed on 11/15/18 due to overall generalized weakness.  Reassessed by PT OT who recommended SNF.  CSW consulted to assist with placement.    Discharge Diagnoses:  Active Hospital Problems   Diagnosis Date Noted  . Pain of upper abdomen   . Postural dizziness with near syncope 11/13/2018  . Tachypnea   . Nausea and vomiting 11/12/2018  . Bilateral hydronephrosis 11/12/2018  . Elevated troponin 11/12/2018  . Lactic acidosis 11/12/2018  . Chronic neck and back pain 11/12/2018  . Essential hypertension 11/12/2018    Resolved Hospital Problems  No resolved problems to display.    Discharge Condition: Stable  Diet recommendation: Resume previous diet  Vitals:   11/17/18 0658 11/17/18 0728  BP: 101/88   Pulse: 70   Resp: 16 18  Temp: 97.6 F (36.4 C)   SpO2: 97%     History of present illness:  JAELIANA GLAZER a 85 y.o.femalewith medical history significant ofhypertension, hypothyroidism, LBBB,arthritis,andGERD;who presents with complaints of nausea and vomiting. [name] began feeling lightheaded as though she may pass out and was unable to walk while brushing her teeth. She is able to sit down on the toilet in the bathroom and became nauseated and vomited. Associated symptoms include complaints of generalized malaise, shortness of breath, achy neck, and achy back pain. She reports that the back and neck pain complaints are not new and related to her history of arthritis. She has not had any  significant cough, fever, headache, change in vision, focal weakness, dysuria, or change in bowel habits.  ED Course:Upon admission into the emergency department patient was seen to be afebrile, pulse 80-106, respirations 15-30, blood pressures 137/94-172/74, and O2 saturation maintained on room air. Orthostatic vital signs were noted to be positive after standing for 3 minutes. Labs significant for MCV 103.4, MCH 34.4, sodium 131, chloride 97, CO2 18, BUN 21, creatinine 1.01, anion gap 16,lactic acid 4.6->3.8, and troponin42->76. CXRshowed no acute abnormality and UA negative for any signs of infection.Blood cultures were ordered. She was started on normal saline IV fluids at 100 mL/h.She has had no more nausea or vomiting since admission,but still does not feel well.  Hospital course complicated by elevated troponin, abnormal 2D echo with LVEF 25%, small to moderate pericardial effusion.  Seen by cardiology and patient declined aggressive cardiac work-up per cardiology.  Persistent abdominal discomfort. States up to 2 weeks prior to presentation was taking ibuprofen daily for months. Unintentional weight loss 5 lbs in the last 4-8 weeks. Seen by GI, declined EGD per GI.   Reassessed by PT with recs for SNF. CSW assisting with placement.  11/17/18: Patient was seen and examined at her bedside this morning.  She reports lower back pain which is not new for her.  States she takes Tylenol at home for it.  Given Tylenol with no improvement of symptoms.  Added lidocaine patch to be applied on lower back and tramadol as needed for severe pain.  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 Seen by vascular surgery, unlikely reason for symptoms. Self-reported daily use of NSAID, ibuprofen for months and 5 pound unintentional weight loss the past 4 to 8 weeks Seen by GI but patient declined EGD. Start Protonix 40 mg twice daily x6 weeks then 40 mg daily afterwards as recommended by GI Follow-up with GI outpatient  Acute on chronic combined systolic and diastolic CHF/small to moderate pericardial effusion. Seen by Dr. Percival Spanish, per review of medical records on 04/10/2014 she had an ejection fraction of 35%, had a left heart cath in September 02, 2013 with history of nonischemic cardiomyopathy 2D echo done on 11/13/2018 showed LVEF 25% with diffuse hypokinesis and septal lateral dyssynchrony consistent with LBBB as well as grade 1 diastolic dysfunction Seen by cardiology, patient declines aggressive cardiac work-up. Continue cardiac medications Follow-up with your cardiologist outpatient  Generalized weakness with physical debility likely in the setting of acute illness Continue PT OT Start out of bed to chair every shift with fall precautions Encourage oral protein calorie intake Continue Ensure BID Dietary consult  Chronic back pain Start lidocaine patch Start tramadol as needed for severe pain Continue symptomatic management  Essential hypertension complicated by orthostatic hypotension Continue cardiac medications No diuretics due to orthostatic hypotension Euvolemic on exam Normal BNP 65 on 11/12/2018 Follow-up with cardiology  Elevated troponin, likely demand ischemia High-sensitivity troponin peaked at 196 and trended down Seen by cardiology, no plan for further ischemic work-up; patient declines aggressive cardiac work-up. Follow-up with cardiology outpatient  Resolved lactic acidosis, suspect secondary to poor perfusion with acute dehydration No sign of active infective process Presented with lactic acid 4.6 on 11/12/2018,  improved post IV fluid hydration Lactic acid 1.7 on 11/13/2018   Chronic euvolemic hyponatremia Asymptomatic Appears to have had hyponatremia since 2018 To be at her baseline with sodium 130 on 11/15/18. Follow-up with your PCP  Methicillin susceptible coagulase-negative staph bacteremia, likely contaminant Afebrile no leukocytosis Not toxic appearing  Urinary retention with bilateral hydronephrosis Follow-up with urology; obtain referral from your primary care provider. No acute kidney injuries, creatinine stable 0.75 on 11/15/2018 and GFR greater than 60 on 11/15/18.  Hypothyroidism Continue levothyroxine  GERD Continue Protonix  Code Status:full  Consultants:  Cardiology  GI  Procedures:  echo    Discharge Exam: BP 101/88 (BP Location: Right Arm)   Pulse 70   Temp 97.6 F (36.4 C) (Oral)   Resp 18   Ht 5\' 3"  (1.6 m)   Wt 67.4 kg   SpO2 97%   BMI 26.32 kg/m  . General: 85 y.o. year-old female well-developed well-nourished in no acute distress.  Alert oriented x3. . Cardiovascular: Regular rate and rhythm no rubs or gallops no JVD or thyromegaly . Respiratory: Clear to station no wheezes or rales.  Poor inspiratory effort.   . Abdomen: Soft nontender nondistended normal bowel sounds present.   . Musculoskeletal: No lower extremity edema.  2 out of 4 pulses in all 4 extremities.   Marland Kitchen Psychiatry: Mood is appropriate for condition and setting.  Discharge Instructions You were cared for by a hospitalist during your hospital stay. If you have any questions about your discharge medications or the care you received while you were in the hospital after you are  discharged, you can call the unit and asked to speak with the hospitalist on call if the hospitalist that took care of you is not available. Once you are discharged, your primary care physician will handle any further medical issues. Please note that NO REFILLS for any discharge medications will be authorized  once you are discharged, as it is imperative that you return to your primary care physician (or establish a relationship with a primary care physician if you do not have one) for your aftercare needs so that they can reassess your need for medications and monitor your lab values.   Allergies as of 11/17/2018      Reactions   Hydrochlorothiazide Other (See Comments)   REACTION: hyponatremia   Ramipril Other (See Comments)   REACTION: GI upset   Remeron [mirtazapine] Other (See Comments)   REACTION: weakness   Sulfa Antibiotics Rash      Medication List    STOP taking these medications   HYDROcodone-acetaminophen 5-325 MG tablet Commonly known as: NORCO/VICODIN   omeprazole 20 MG capsule Commonly known as: PRILOSEC     TAKE these medications   aspirin 325 MG tablet Take 1 tablet (325 mg total) by mouth daily.   carvedilol 12.5 MG tablet Commonly known as: COREG Take 1 tablet (12.5 mg total) by mouth 2 (two) times daily. What changed:   medication strength  how much to take   diclofenac sodium 1 % Gel Commonly known as: Voltaren Apply 4 g topically 4 (four) times daily. What changed:   when to take this  reasons to take this   estradiol 1 MG tablet Commonly known as: ESTRACE Take 1 mg by mouth daily.   HAIR SKIN AND NAILS FORMULA PO Take 1 tablet by mouth daily.   levothyroxine 50 MCG tablet Commonly known as: SYNTHROID Take 50 mcg by mouth daily before breakfast.   lidocaine 5 % Commonly known as: LIDODERM Place 1 patch onto the skin daily for 3 days. Remove & Discard patch within 12 hours or as directed by MD Start taking on: November 18, 2018   LORazepam 0.5 MG tablet Commonly known as: ATIVAN Take 1 tablet (0.5 mg total) by mouth at bedtime as needed for sleep.   losartan 100 MG tablet Commonly known as: COZAAR Take 1 tablet (100 mg total) by mouth daily. What changed:   medication strength  how much to take   MELATONIN PO Take 1 mL by mouth  at bedtime.   pantoprazole 40 MG tablet Commonly known as: PROTONIX Take 1 tablet (40 mg total) by mouth 2 (two) times daily. Take 1 tablet 40 mg BID x 6 weeks then 1 tab 40 mg daily afterwards   senna-docusate 8.6-50 MG tablet Commonly known as: Senokot-S Take 2 tablets by mouth 2 (two) times daily.   traMADol 50 MG tablet Commonly known as: ULTRAM Take 1 tablet (50 mg total) by mouth daily as needed for severe pain.   TURMERIC CURCUMIN PO Take 1 tablet by mouth daily.   VITAMIN B-12 PO Take 1 tablet by mouth daily.   VITAMIN C PO Take 1 tablet by mouth daily.   VITAMIN D PO Take 1 tablet by mouth daily.            Durable Medical Equipment  (From admission, onward)         Start     Ordered   11/15/18 0600  For home use only DME Walker rolling  Once    Comments: 5" wheels  Question:  Patient needs a walker to treat with the following condition  Answer:  Ambulatory dysfunction   11/15/18 0601         Allergies  Allergen Reactions  . Hydrochlorothiazide Other (See Comments)    REACTION: hyponatremia  . Ramipril Other (See Comments)    REACTION: GI upset  . Remeron [Mirtazapine] Other (See Comments)    REACTION: weakness  . Sulfa Antibiotics Rash    Contact information for follow-up providers    Jodelle Gross, NP Follow up on 11/26/2018.   Specialties: Nurse Practitioner, Radiology, Cardiology Why: Please arrive 15 minutes early for your 9:45am post-hospital cardiology follow-up appointment Contact information: [address] [place] [address] [phone]        [name], MD Follow up on 01/31/2019.   Specialty: Cardiology Why: Please arrive 15 minutes early for your 10am appointment Contact information: [address] [place] [address] [phone]            Contact information for after-discharge care    Destination    [facility] Preferred SNF .   Service: Skilled Nursing  Contact information: [address] [place] [address] [phone]                   The results of significant diagnostics from this hospitalization (including imaging, microbiology, ancillary and laboratory) are listed below for reference.    Significant Diagnostic Studies: Dg Chest Port 1 View  Result Date: 11/12/2018 CLINICAL DATA:  Tachypnea EXAM: PORTABLE CHEST 1 VIEW COMPARISON:  01/30/2017 FINDINGS: Low volume chest with asymmetric elevation of the right diaphragm, chronic. Normal heart size and mediastinal contours for technique. There is no edema, consolidation, effusion, or pneumothorax. IMPRESSION: No evidence of acute disease. Electronically Signed   By: Marnee Spring M.D.   On: 11/12/2018 05:51   Ct Angio Chest/abd/pel For Dissection W And/or Wo Contrast  Result Date: 11/12/2018 CLINICAL DATA:  Chest and back pain with aortic dissection suspected EXAM: CT ANGIOGRAPHY CHEST, ABDOMEN AND PELVIS TECHNIQUE: Multidetector CT imaging through the chest, abdomen and pelvis was performed using the standard protocol during bolus administration of intravenous contrast. Multiplanar reconstructed images and MIPs were obtained and reviewed to evaluate the vascular anatomy. CONTRAST:  80mL OMNIPAQUE IOHEXOL 350 MG/ML SOLN COMPARISON:  Abdomen and pelvis CT 09/09/2008.  Chest CTA 01/31/2017 FINDINGS: CTA CHEST FINDINGS Cardiovascular: Noncontrast phase shows no intramural hematoma no aortic dissection or aneurysm. No visible pulmonary artery filling defect. Scattered atherosclerotic calcification of the aorta. Right coronary calcified plaque is noted. Mild atheromatous narrowing at the proximal left subclavian artery. Mediastinum/Nodes: No adenopathy or mass. Lungs/Pleura: Mild dependent atelectasis and right upper lobe scarring. There is no edema, consolidation, effusion, or pneumothorax. Musculoskeletal: Diffuse degenerative disc narrowing with endplate ridging.  There is exaggerated thoracic kyphosis and mild scoliosis. Notable T7-8 and T8-9 central disc protrusion. Review of the MIP images confirms the above findings. CTA ABDOMEN AND PELVIS FINDINGS VASCULAR Aorta: Multifocal atherosclerotic plaque. No aneurysm or dissection. Celiac: High-grade narrowing at the celiac origin from atherosclerosis and likely from median arcuate ligament. SMA: Widely patent SMA. No branch occlusion or beading. Prominent pancreatic arcade compensating for the proximal celiac narrowing. Renals: Atherosclerotic plaque at both ostia without flow limiting stenosis. Early branching left renal artery. IMA: Patent Inflow: Atherosclerotic plaque without dissection or aneurysm. Veins: Negative in the arterial phase Review of the MIP images confirms the above findings. NON-VASCULAR Hepatobiliary: No focal liver abnormality.No evidence of  biliary obstruction or stone. Pancreas: Unremarkable. Spleen: Unremarkable. Adrenals/Urinary Tract: Negative adrenals. Bilateral hydroureteronephrosis to the level of distended bladder. Stomach/Bowel:  No obstruction.  No evidence of inflammation. Lymphatic: No acute vascular abnormality.  No mass or adenopathy. Reproductive:Hysterectomy. Other: No ascites or pneumoperitoneum. Musculoskeletal: No acute abnormalities. Severe diffuse lumbar spine degeneration with dextroscoliosis. No acute osseous finding Review of the MIP images confirms the above findings. IMPRESSION: 1. No acute vascular finding including evidence of acute aortic syndrome. 2. Atherosclerosis with proximal celiac narrowing but widely patent SMA and peripancreatic arcade. 3. Distended bladder resulting in bilateral hydroureteronephrosis. Electronically Signed   By: Marnee SpringJonathon  Watts M.D.   On: 11/12/2018 06:50    Microbiology: Recent Results (from the past 240 hour(s))  Culture, blood (routine x 2)     Status: Abnormal   Collection Time: 11/12/18  2:10 AM   Specimen: BLOOD  Result Value Ref Range  Status   Specimen Description   Final    BLOOD RIGHT ANTECUBITAL Performed at [facility], [address] [place], [place] [address]    Special Requests   Final    BOTTLES DRAWN AEROBIC AND ANAEROBIC Blood Culture adequate volume Performed at [facility], [address] [place], [place] [address]    Culture  Setup Time   Final    GRAM POSITIVE COCCI IN BOTH AEROBIC AND ANAEROBIC BOTTLES CRITICAL RESULT CALLED TO, READ BACK BY AND VERIFIED WITH: PHARMD T BAUMEISTER [id number] MLM    Culture (A)  Final    STAPHYLOCOCCUS SPECIES (COAGULASE NEGATIVE) THE SIGNIFICANCE OF ISOLATING THIS ORGANISM FROM A SINGLE SET OF BLOOD CULTURES WHEN MULTIPLE SETS ARE DRAWN IS UNCERTAIN. PLEASE NOTIFY THE MICROBIOLOGY DEPARTMENT WITHIN ONE WEEK IF SPECIATION AND SENSITIVITIES ARE REQUIRED. Performed at [facility], [address] [place], [place] [address]    Report Status 11/15/2018 FINAL  Final  Blood Culture ID Panel (Reflexed)     Status: Abnormal   Collection Time: 11/12/18  2:10 AM  Result Value Ref Range Status   Enterococcus species NOT DETECTED NOT DETECTED Final   Listeria monocytogenes NOT DETECTED NOT DETECTED Final   Staphylococcus species DETECTED (A) NOT DETECTED Final    Comment: Methicillin (oxacillin) susceptible coagulase negative staphylococcus. Possible blood culture contaminant (unless isolated from more than one blood culture draw or clinical case suggests pathogenicity). No antibiotic treatment is indicated for blood  culture contaminants. CRITICAL RESULT CALLED TO, READ BACK BY AND VERIFIED WITH: PHARMD T BAUMEISTER [id number] MLM    Staphylococcus aureus (BCID) NOT DETECTED NOT DETECTED Final   Methicillin resistance NOT DETECTED NOT DETECTED Final   Streptococcus species NOT DETECTED NOT DETECTED Final   Streptococcus agalactiae NOT DETECTED NOT DETECTED Final   Streptococcus pneumoniae NOT DETECTED NOT DETECTED Final   Streptococcus  pyogenes NOT DETECTED NOT DETECTED Final   Acinetobacter baumannii NOT DETECTED NOT DETECTED Final   Enterobacteriaceae species NOT DETECTED NOT DETECTED Final   Enterobacter cloacae complex NOT DETECTED NOT DETECTED Final   Escherichia coli NOT DETECTED NOT DETECTED Final   Klebsiella oxytoca NOT DETECTED NOT DETECTED Final   Klebsiella pneumoniae NOT DETECTED NOT DETECTED Final   Proteus species NOT DETECTED NOT DETECTED Final   Serratia marcescens NOT DETECTED NOT DETECTED Final   Haemophilus influenzae NOT DETECTED NOT DETECTED Final   Neisseria meningitidis NOT DETECTED NOT DETECTED Final   Pseudomonas aeruginosa NOT DETECTED NOT DETECTED Final   Candida albicans NOT DETECTED NOT DETECTED Final   Candida glabrata NOT  DETECTED NOT DETECTED Final   Candida krusei NOT DETECTED NOT DETECTED Final   Candida parapsilosis NOT DETECTED NOT DETECTED Final   Candida tropicalis NOT DETECTED NOT DETECTED Final    Comment: Performed at [facility], [address], [place], [place] [address]  SARS CORONAVIRUS 2 (TAT 6-24 HRS) Nasopharyngeal Nasopharyngeal Swab     Status: None   Collection Time: 11/12/18  5:47 AM   Specimen: Nasopharyngeal Swab  Result Value Ref Range Status   SARS Coronavirus 2 NEGATIVE NEGATIVE Final    Comment: (NOTE) SARS-CoV-2 target nucleic acids are NOT DETECTED. The SARS-CoV-2 RNA is generally detectable in upper and lower respiratory specimens during the acute phase of infection. Negative results do not preclude SARS-CoV-2 infection, do not rule out co-infections with other pathogens, and should not be used as the sole basis for treatment or other patient management decisions. Negative results must be combined with clinical observations, patient history, and epidemiological information. The expected result is Negative. Fact Sheet for Patients: HairSlick.no Fact Sheet for Healthcare Providers:  quierodirigir.com This test is not yet approved or cleared by the [place] FDA and  has been authorized for detection and/or diagnosis of SARS-CoV-2 by FDA under an Emergency Use Authorization (EUA). This EUA will remain  in effect (meaning this test can be used) for the duration of the COVID-19 declaration under Section 56 4(b)(1) of the Act, 21 U.S.C. section 360bbb-3(b)(1), unless the authorization is terminated or revoked sooner. Performed at [facility], [address], [place], [place] [address]   Culture, blood (routine x 2)     Status: None   Collection Time: 11/12/18  7:02 PM   Specimen: BLOOD LEFT HAND  Result Value Ref Range Status   Specimen Description BLOOD LEFT HAND  Final   Special Requests   Final    BOTTLES DRAWN AEROBIC ONLY Blood Culture adequate volume   Culture   Final    NO GROWTH 5 DAYS Performed at [facility], [address], [place], [place] [address]    Report Status 11/17/2018 FINAL  Final     Labs: Basic Metabolic Panel: Recent Labs  Lab 11/12/18 0210 11/13/18 0009 11/13/18 0811 11/14/18 0304 11/15/18 0732  NA 131* 130* 129* 130* 130*  K 4.5 3.7 3.6 3.7 3.7  CL 97* 98 96* 100 97*  CO2 18* 20* 21* 22 22  GLUCOSE 156* 118* 108* 107* 107*  BUN 21 8 7* 12 14  CREATININE 1.01* 0.77 0.70 0.85 0.75  CALCIUM 9.4 9.0 8.9 8.7* 8.9   Liver Function Tests: Recent Labs  Lab 11/12/18 0210 11/14/18 0304  AST 26 26  ALT 16 15  ALKPHOS 42 35*  BILITOT 0.6 0.5  PROT 7.1 5.7*  ALBUMIN 3.9 2.9*   Recent Labs  Lab 11/12/18 0210  LIPASE 25   No results for input(s): AMMONIA in the last 168 hours. CBC: Recent Labs  Lab 11/12/18 0210 11/12/18 1345 11/13/18 0009  WBC 9.6  --  8.4  NEUTROABS 6.2  --   --   HGB 12.3  --  12.5  HCT 37.0 34.5 35.2*  MCV 103.4*  --  99.7  PLT 262  --  231   Cardiac Enzymes: No results for input(s): CKTOTAL, CKMB, CKMBINDEX, TROPONINI in the last 168 hours. BNP:  BNP (last 3 results) Recent Labs    11/12/18 0210  BNP 65.8    ProBNP (last 3 results) No results for input(s): PROBNP in the last  8760 hours.  CBG: Recent Labs  Lab 11/12/18 0208  GLUCAP 145*       Signed:  Darlin Droparole N Thornton Dohrmann, MD [facility] 11/17/2018, 12:10 PM

## 2018-11-18 ENCOUNTER — Inpatient Hospital Stay (HOSPITAL_COMMUNITY): Payer: Medicare Other

## 2018-11-18 LAB — CBC WITH DIFFERENTIAL/PLATELET
Abs Immature Granulocytes: 0.29 10*3/uL — ABNORMAL HIGH (ref 0.00–0.07)
Basophils Absolute: 0.1 10*3/uL (ref 0.0–0.1)
Basophils Relative: 0 %
Eosinophils Absolute: 0 10*3/uL (ref 0.0–0.5)
Eosinophils Relative: 0 %
HCT: 35.1 % — ABNORMAL LOW (ref 36.0–46.0)
Hemoglobin: 12.1 g/dL (ref 12.0–15.0)
Immature Granulocytes: 2 %
Lymphocytes Relative: 6 %
Lymphs Abs: 1 10*3/uL (ref 0.7–4.0)
MCH: 34.7 pg — ABNORMAL HIGH (ref 26.0–34.0)
MCHC: 34.5 g/dL (ref 30.0–36.0)
MCV: 100.6 fL — ABNORMAL HIGH (ref 80.0–100.0)
Monocytes Absolute: 0.9 10*3/uL (ref 0.1–1.0)
Monocytes Relative: 5 %
Neutro Abs: 14.5 10*3/uL — ABNORMAL HIGH (ref 1.7–7.7)
Neutrophils Relative %: 87 %
Platelets: 238 10*3/uL (ref 150–400)
RBC: 3.49 MIL/uL — ABNORMAL LOW (ref 3.87–5.11)
RDW: 12.6 % (ref 11.5–15.5)
WBC: 16.7 10*3/uL — ABNORMAL HIGH (ref 4.0–10.5)
nRBC: 0 % (ref 0.0–0.2)

## 2018-11-18 LAB — COMPREHENSIVE METABOLIC PANEL
ALT: 23 U/L (ref 0–44)
AST: 37 U/L (ref 15–41)
Albumin: 2.7 g/dL — ABNORMAL LOW (ref 3.5–5.0)
Alkaline Phosphatase: 44 U/L (ref 38–126)
Anion gap: 12 (ref 5–15)
BUN: 18 mg/dL (ref 8–23)
CO2: 23 mmol/L (ref 22–32)
Calcium: 8.8 mg/dL — ABNORMAL LOW (ref 8.9–10.3)
Chloride: 95 mmol/L — ABNORMAL LOW (ref 98–111)
Creatinine, Ser: 0.94 mg/dL (ref 0.44–1.00)
GFR calc Af Amer: >60 mL/min (ref >60)
GFR calc non Af Amer: 56 mL/min — ABNORMAL LOW (ref >60)
Glucose, Bld: 133 mg/dL — ABNORMAL HIGH (ref 70–99)
Potassium: 4 mmol/L (ref 3.5–5.1)
Sodium: 130 mmol/L — ABNORMAL LOW (ref 135–145)
Total Bilirubin: 0.3 mg/dL (ref 0.3–1.2)
Total Protein: 6.2 g/dL — ABNORMAL LOW (ref 6.5–8.1)

## 2018-11-18 LAB — TROPONIN I (HIGH SENSITIVITY)
Troponin I (High Sensitivity): 47 ng/L — ABNORMAL HIGH (ref <18)
Troponin I (High Sensitivity): 39 ng/L — ABNORMAL HIGH (ref <18)

## 2018-11-18 LAB — LACTIC ACID, PLASMA
Lactic Acid, Venous: 1.9 mmol/L (ref 0.5–1.9)
Lactic Acid, Venous: 2.4 mmol/L (ref 0.5–1.9)

## 2018-11-18 LAB — CK: Total CK: 428 U/L — ABNORMAL HIGH (ref 38–234)

## 2018-11-18 IMAGING — MR MR LUMBAR SPINE WO/W CM
4 of 7 series · 25 of 48 positions shown · IV contrast (gadavist)
Comparison: None.

CLINICAL DATA: Back pain. Nausea and vomiting.

EXAM:
MRI LUMBAR SPINE WITHOUT AND WITH CONTRAST
TECHNIQUE: Multiplanar and multiecho pulse sequences of the lumbar spine were
obtained without and with intravenous contrast.
CONTRAST:  6mL GADAVIST GADOBUTROL 1 MMOL/ML IV SOLN

[Series 5: T2 · sagittal · 4.0mm · 0.73mm/px · 7 of 19 slices shown (1 of 2)]
[im 1/19]
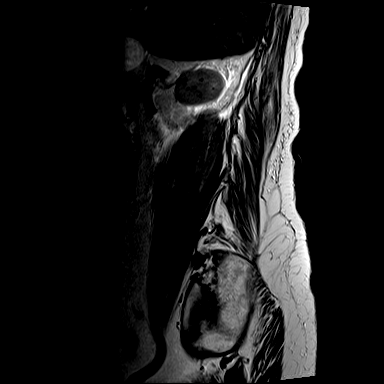
[im 4/19]
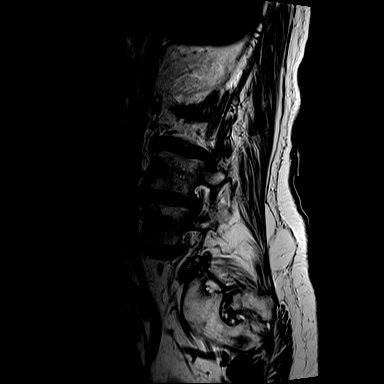
[im 7/19]
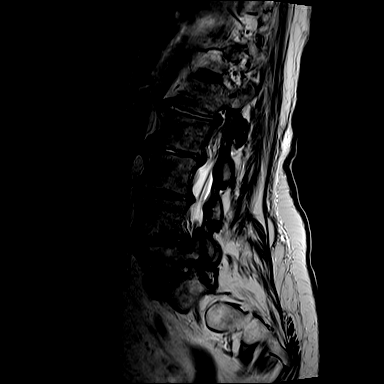
[im 10/19]
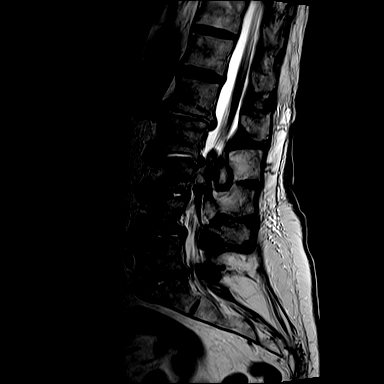
[im 13/19]
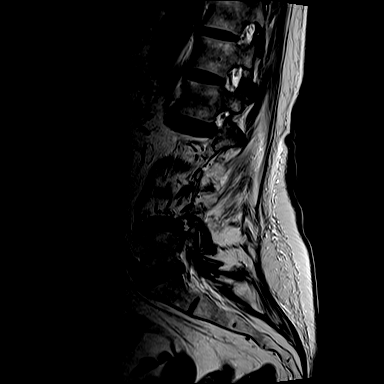
[im 16/19]
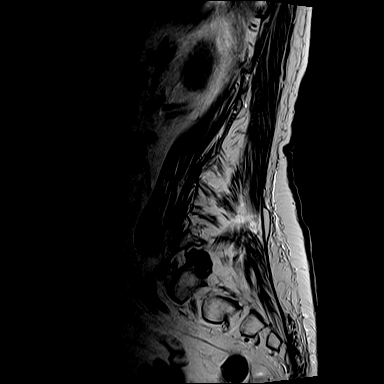
[im 19/19]
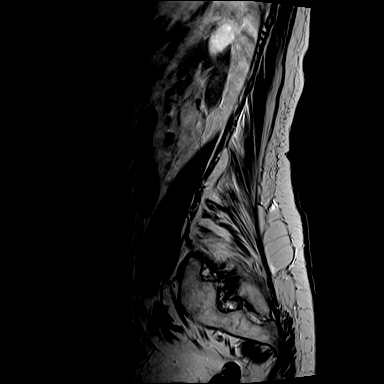

[Series 8: T2 · axial · 5.0mm · 0.57mm/px · z∈[-364,-197]mm · 7 of 21 slices shown (2 of 2)]
[im 1/21]
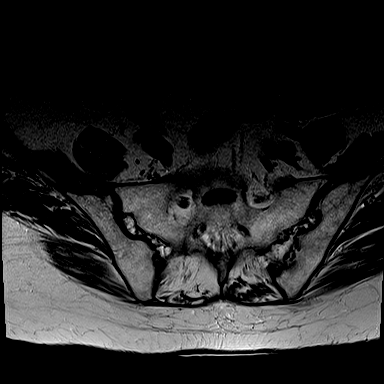
[im 4/21]
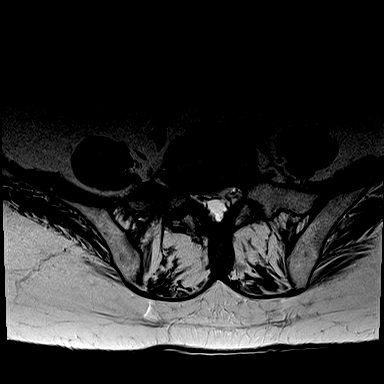
[im 7/21]
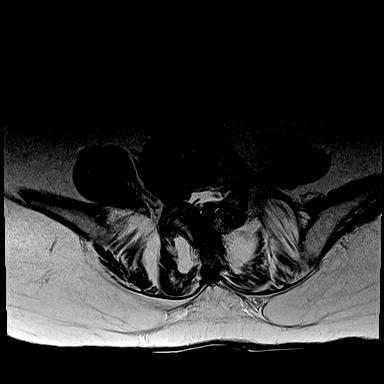
[im 11/21]
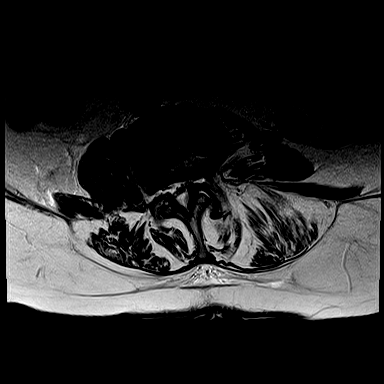
[im 14/21]
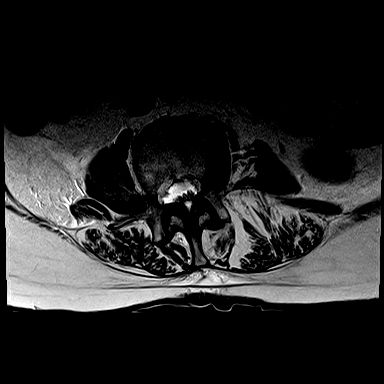
[im 17/21]
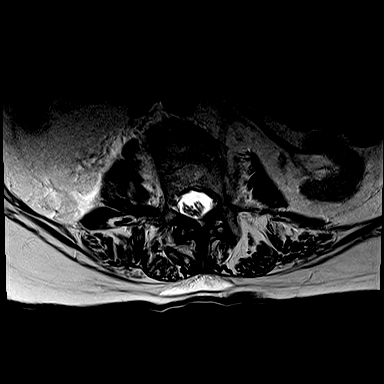
[im 21/21]
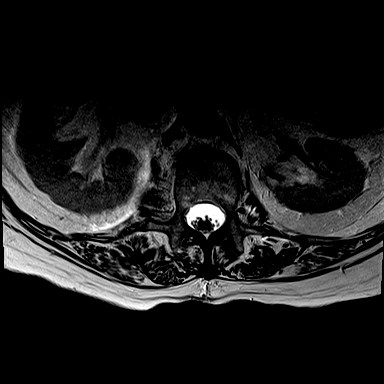

[Series 9: T1 · sagittal · 4.0mm · 0.88mm/px · 6 of 16 slices shown (1 of 2)]
[im 1/16]
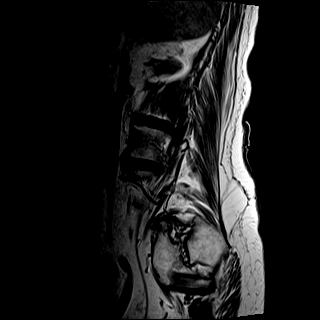
[im 4/16]
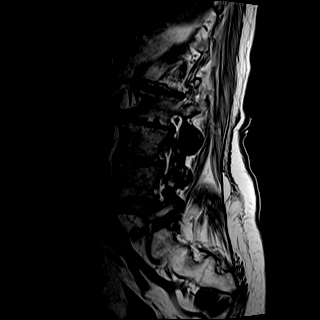
[im 7/16]
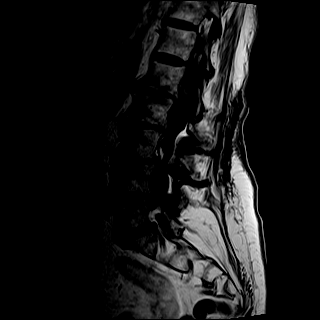
[im 10/16]
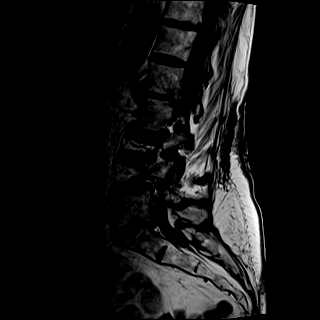
[im 13/16]
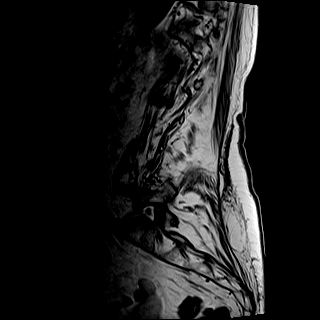
[im 16/16]
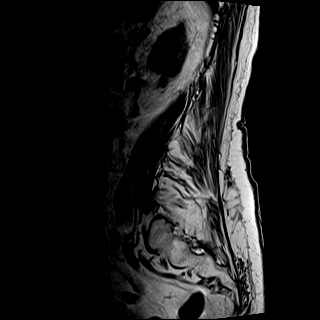

[Series 10: T1 · axial · 5.0mm · 0.34mm/px · z∈[-364,-227]mm · 5 of 21 slices shown (2 of 2)]
[im 1/21]
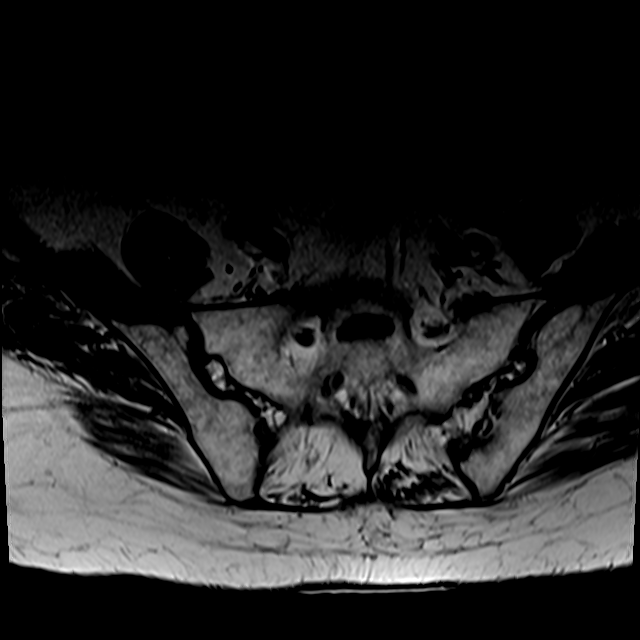
[im 4/21]
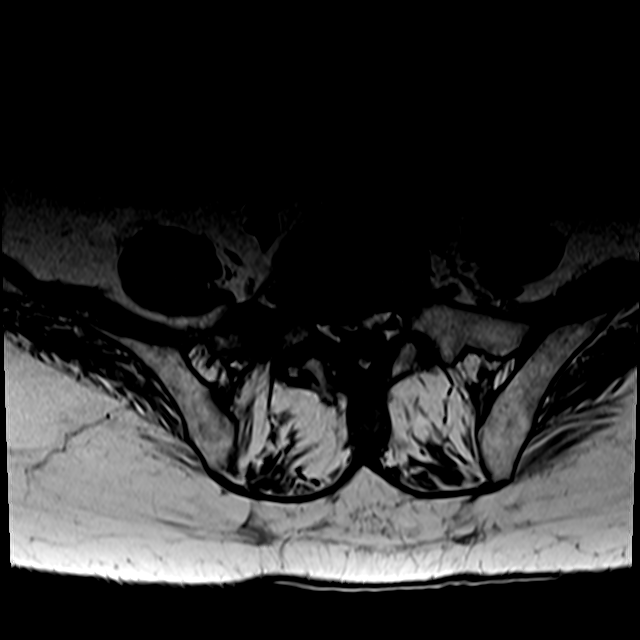
[im 7/21]
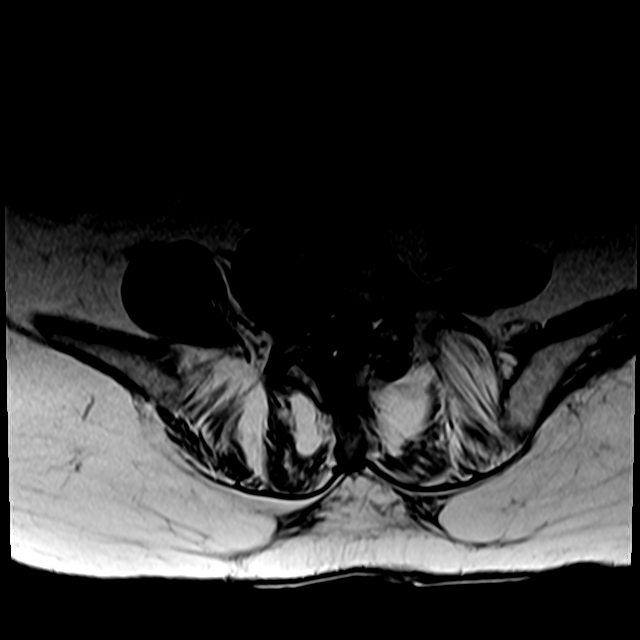
[im 11/21]
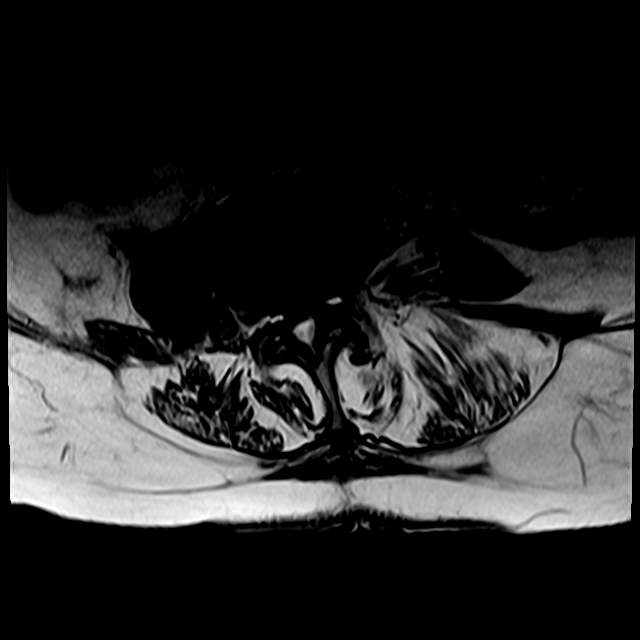
[im 17/21]
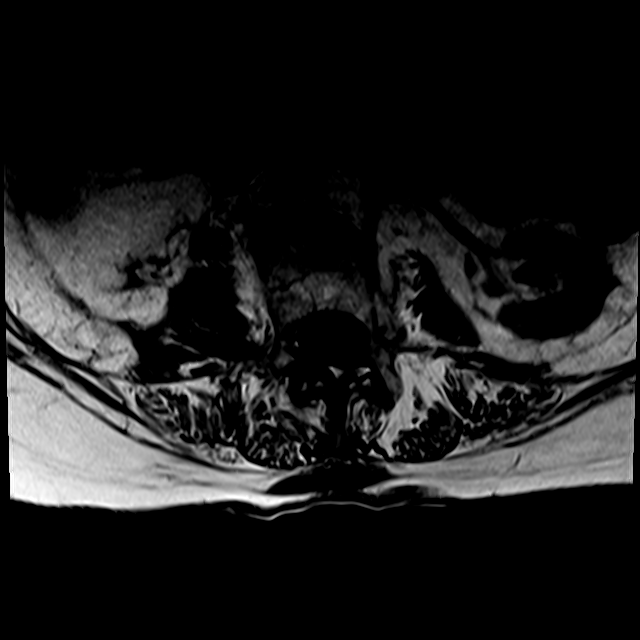

[25 of 48 positions shown; findings below may reference images not displayed]

FINDINGS: Segmentation:  Normal

Alignment:  Dextroscoliosis with apex at L3

Vertebrae: No acute compression fracture, discitis-osteomyelitis,
facet edema or other focal marrow lesion. No epidural collection.

Conus medullaris and cauda equina: Conus extends to the L2 level.
Conus and cauda equina appear normal.

Paraspinal and other soft tissues: Negative.

Disc levels:

T12-L1: Normal disc space and facets. No spinal canal or
neuroforaminal stenosis.

L1-L2: Disc space narrowing with endplate spurring. No spinal canal
stenosis. Severe right neural foraminal stenosis.

L2-L3: Disc space narrowing with endplate spurring. Moderate
bilateral neural foraminal stenosis. Mild facet hypertrophy. No
central spinal canal stenosis.

L3-L4: Disc space narrowing with diffuse bulge. Moderate facet
hypertrophy. Mild spinal canal stenosis with severe narrowing of the
left lateral recess. Mild right and severe left neural foraminal
stenosis.

L4-L5: Small disc bulge with mild facet hypertrophy. No spinal canal
stenosis. Mild right and severe left neural foraminal stenosis.

L5-S1: Small disc bulge with severe right facet hypertrophy. There
is severe right neural foraminal stenosis and narrowing of the right
lateral recess. No central spinal canal or left neural foraminal
stenosis.

Visualized sacrum: Normal.
IMPRESSION: 1. No epidural abscess or discitis-osteomyelitis.
2. Severe left lateral recess stenosis at L3-L4 due to combination
of disc bulge and facet arthrosis.
3. Multilevel moderate-to-severe neural foraminal stenosis.

## 2018-11-18 IMAGING — MR MR SACRUM / SI JOINTS WO/W CM
4 of 9 series · 17 of 48 positions shown · IV contrast (gadavist)
Comparison: CT scan from yesterday.

CLINICAL DATA: Back pain.  Possible infection.

EXAM:
MRI PELVIS WITHOUT AND WITH CONTRAST
TECHNIQUE: Multiplanar multisequence MR imaging of the pelvis was performed
both before and after administration of intravenous contrast.
CONTRAST:  6mL GADAVIST GADOBUTROL 1 MMOL/ML IV SOLN

[Series 22: T1 · oblique · 4.0mm · 0.43mm/px · 5 of 27 slices shown (1 of 3)]
[im 1/27]
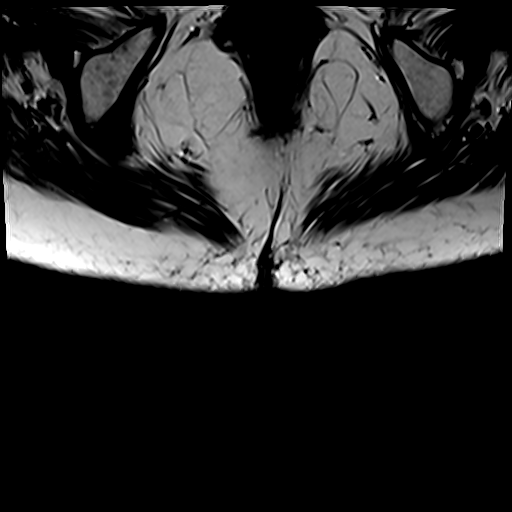
[im 7/27]
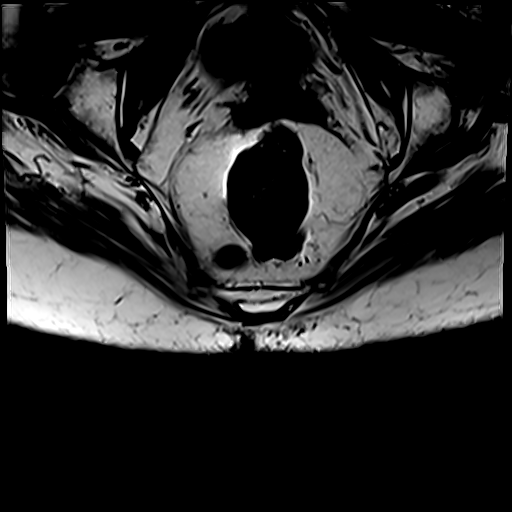
[im 14/27]
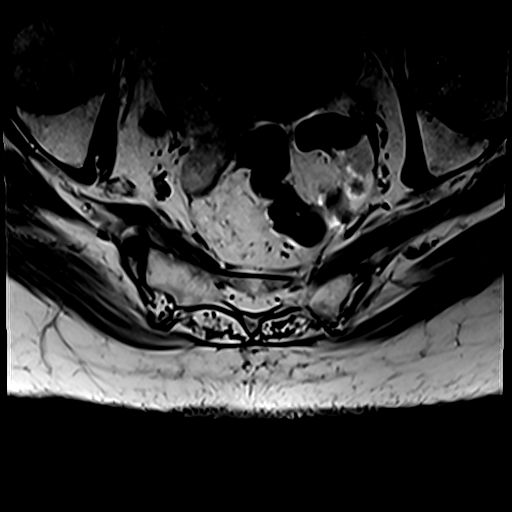
[im 20/27]
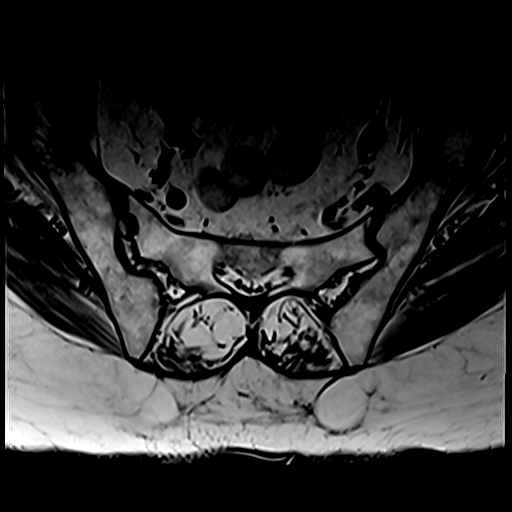
[im 27/27]
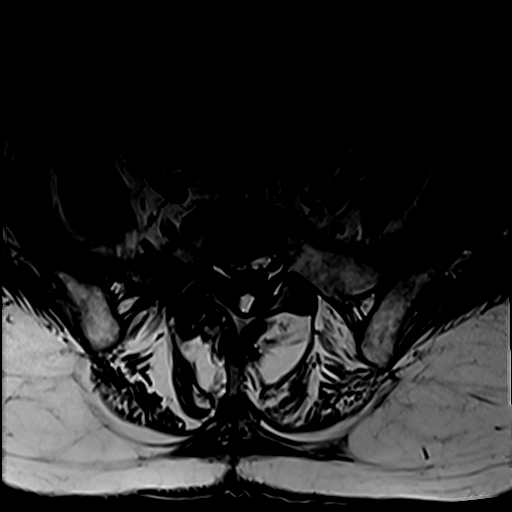

[Series 26: T1 · axial · 3.0mm · 0.45mm/px · z∈[-379,-275]mm · 5 of 30 slices shown (2 of 3)]
[im 1/30]
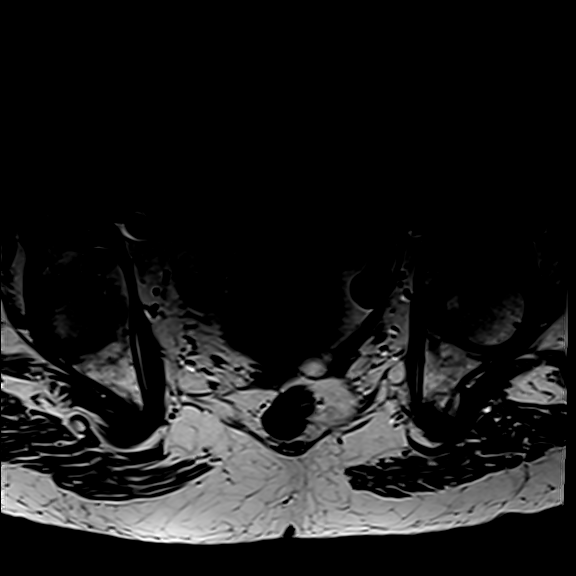
[im 8/30]
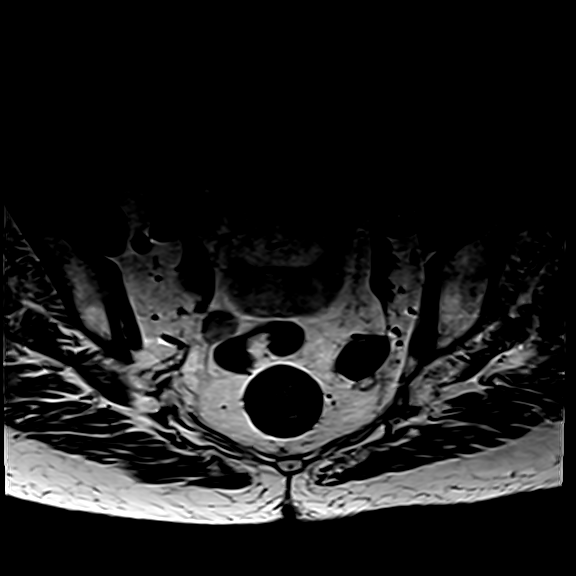
[im 15/30]
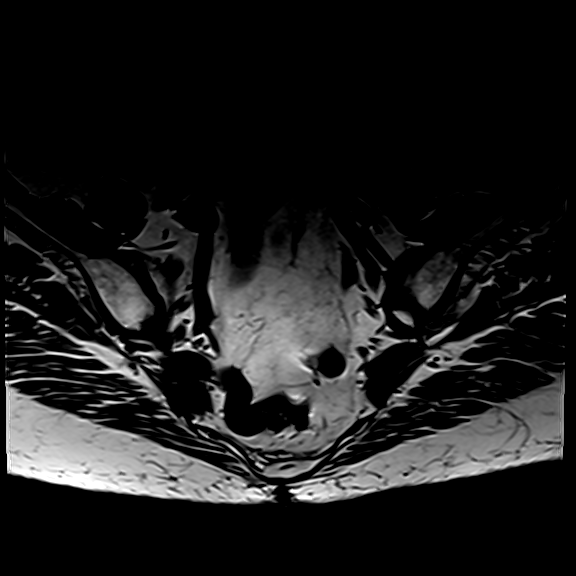
[im 22/30]
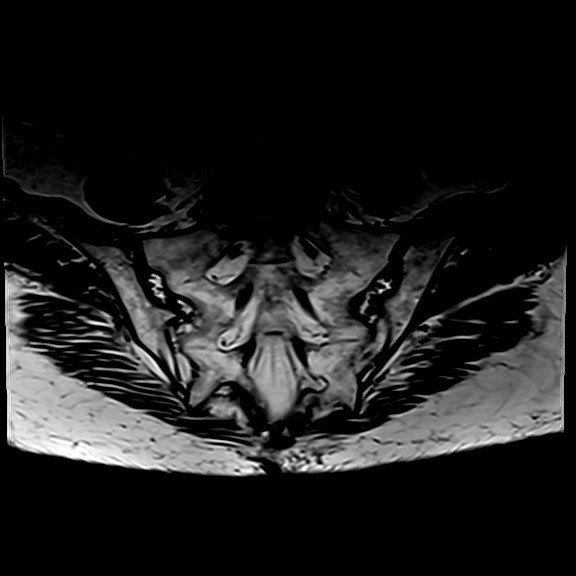
[im 30/30]
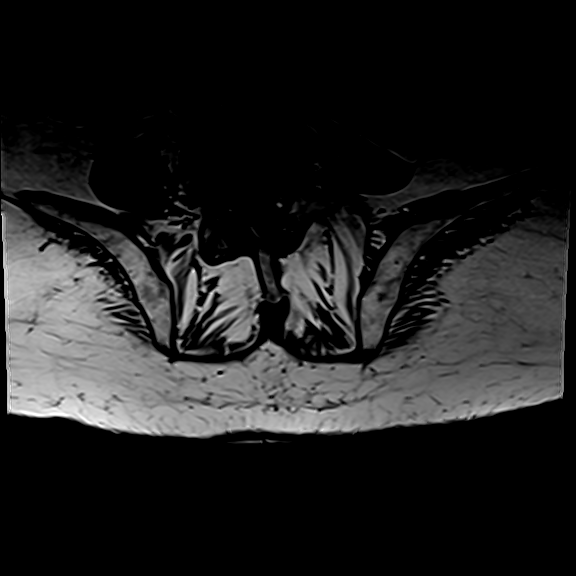

[Series 28: T1 · axial · non-contrast · 5.0mm · 0.31mm/px · z∈[-177,-1]mm · 4 of 35 slices shown (3 of 3)]
[im 1/35]
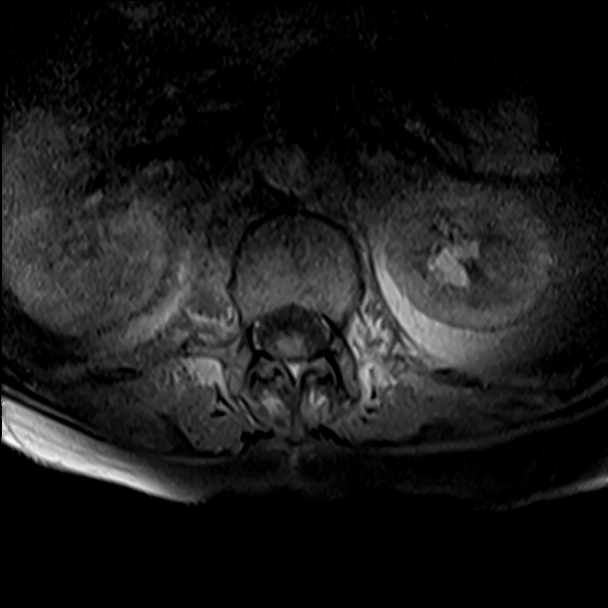
[im 7/35]
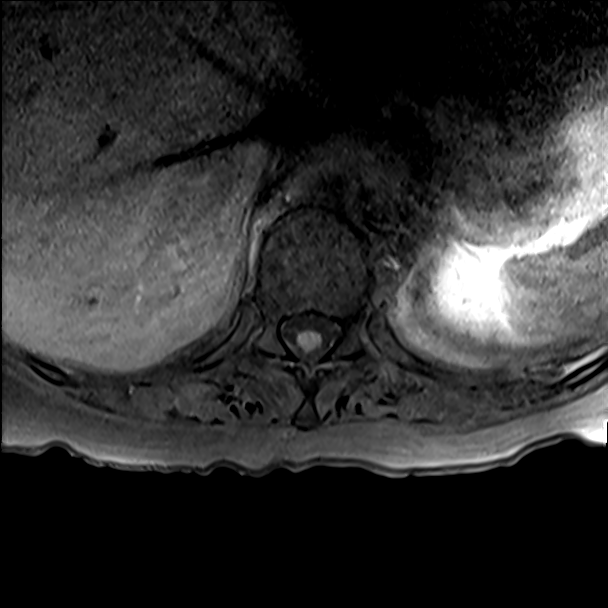
[im 21/35]
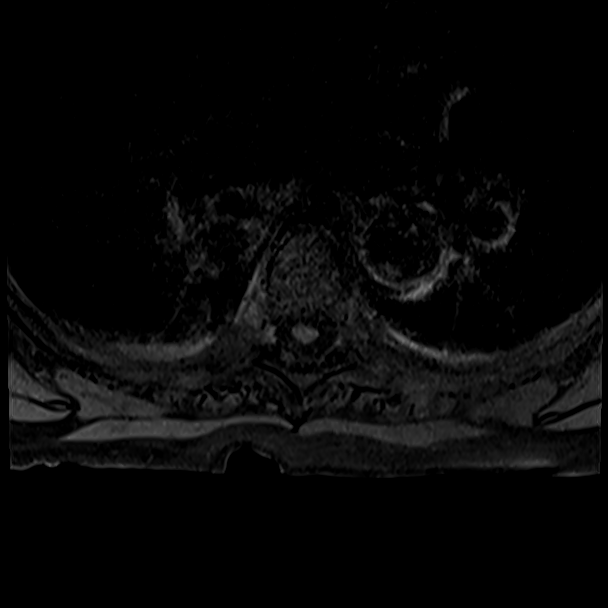
[im 35/35]
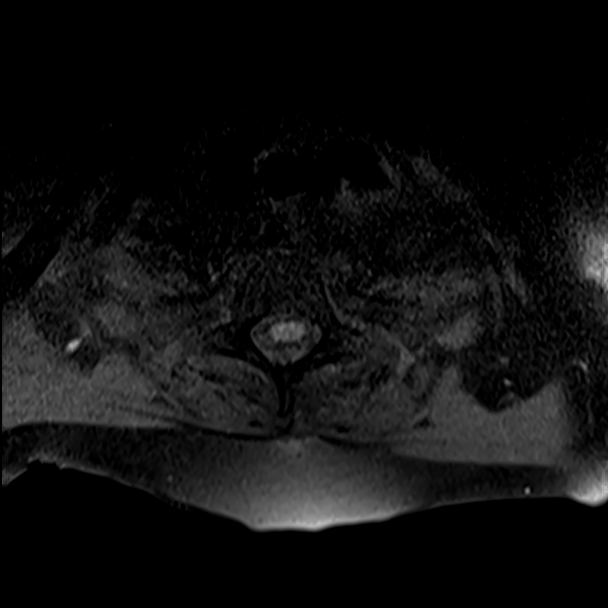

[Series 29: T1 fat-sat post-contrast · oblique · 4.0mm · 0.43mm/px · 3 of 27 slices shown]
[im 1/27]
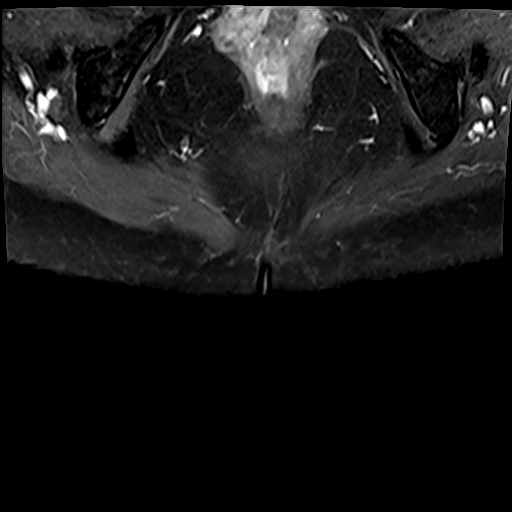
[im 14/27]
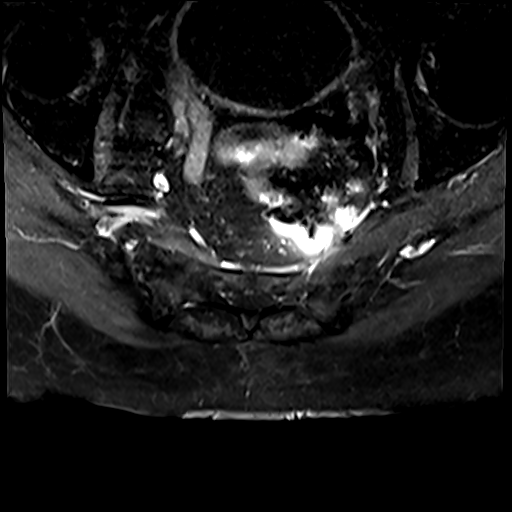
[im 27/27]
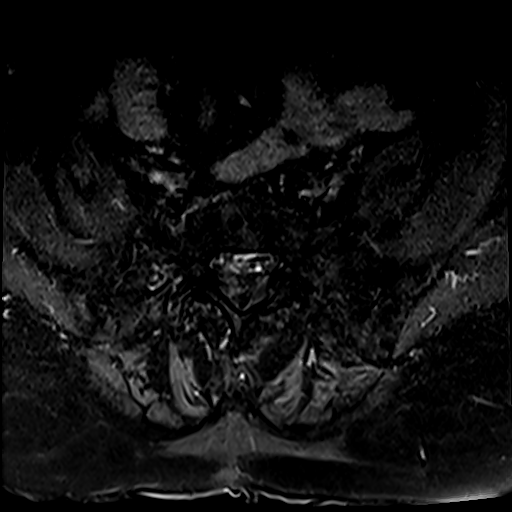

[17 of 48 positions shown; findings below may reference images not displayed]

FINDINGS: The bony structures are intact. No stress fracture or bone lesion.
No findings suggests sacroiliitis or septic arthritis.

Mild inflammatory changes involving the right piriformis muscle and
also the upper aspect of the right iliopsoas muscle. This could
reflect nonspecific myositis.

Both hips are normally located. Small joint effusions but no
findings suspicious for septic arthritis. No evidence of avascular
necrosis.

No significant intrapelvic abnormalities are identified.
IMPRESSION: 1. No findings suspicious for septic arthritis or osteomyelitis
involving the sacrum or SI joints or hip joints.
2. Areas of nonspecific myositis mainly involving the right
piriformis muscle and the right iliopsoas muscle.
3. No significant intrapelvic abnormalities.

## 2018-11-18 IMAGING — MR MR THORACIC SPINE WO/W CM
5 of 9 series · 22 of 48 positions shown · IV contrast (gadavist)
Comparison: None.

CLINICAL DATA: Back pain

EXAM:
MRI THORACIC WITHOUT AND WITH CONTRAST
TECHNIQUE: Multiplanar and multiecho pulse sequences of the thoracic spine were
obtained without and with intravenous contrast.
CONTRAST:  6mL GADAVIST GADOBUTROL 1 MMOL/ML IV SOLN

[Series 21: T1 · sagittal · 3.3mm · 0.62mm/px · 1 of 9 slices shown (1 of 3)]
[im 1/9]
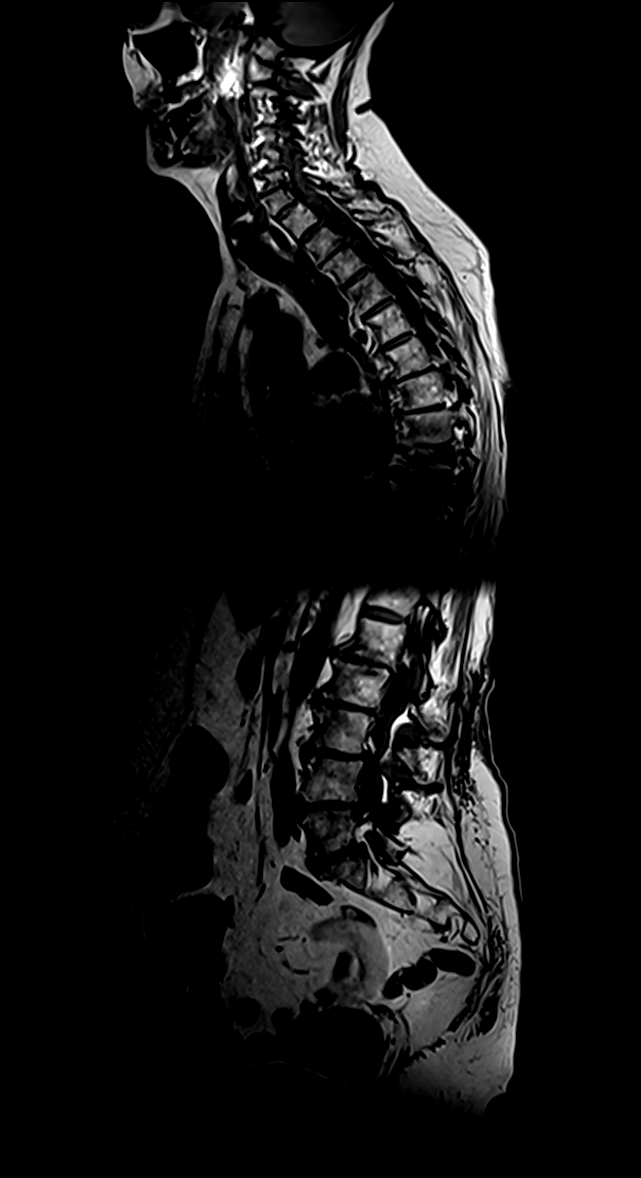

[Series 22: T2 · sagittal · 3.0mm · 0.71mm/px · 3 of 18 slices shown (1 of 2)]
[im 1/18]
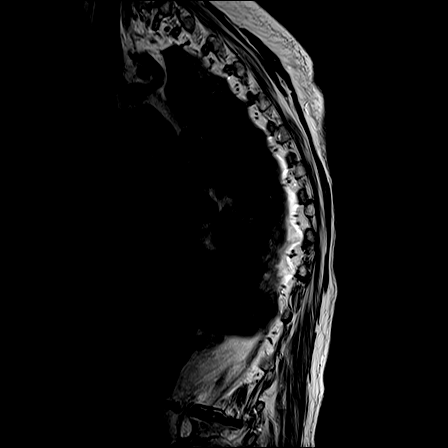
[im 9/18]
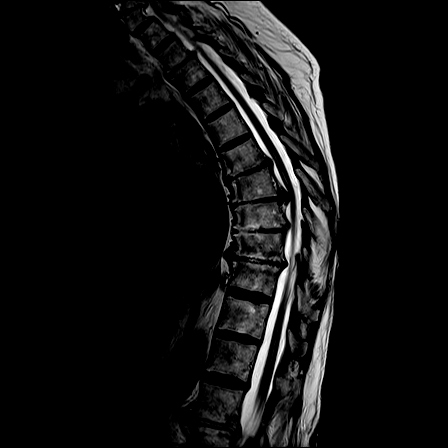
[im 18/18]
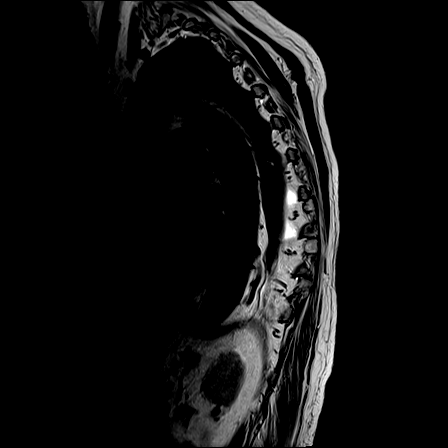

[Series 23: T1 · sagittal · 3.0mm · 0.71mm/px · 4 of 18 slices shown (2 of 3)]
[im 1/18]
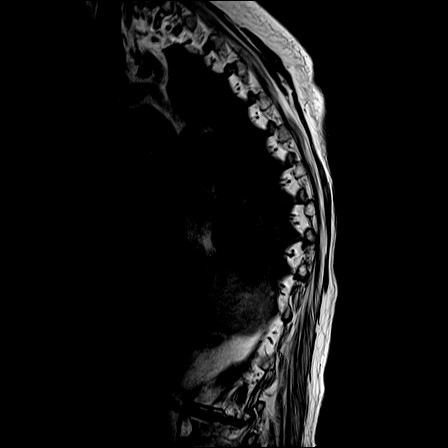
[im 6/18]
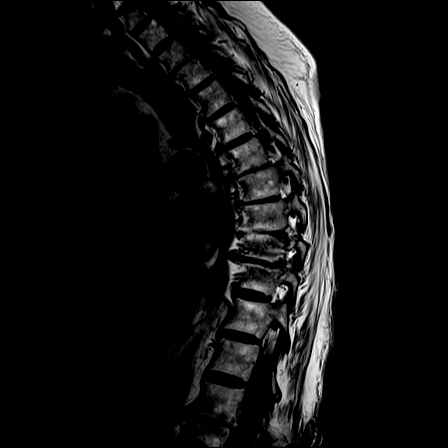
[im 12/18]
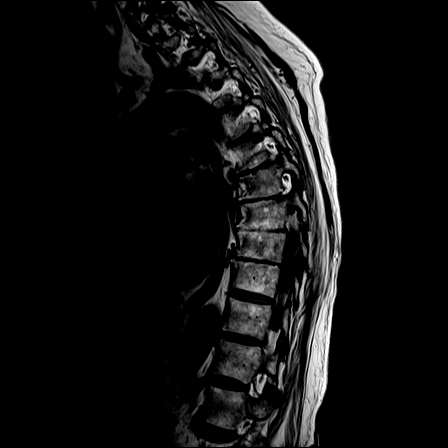
[im 18/18]
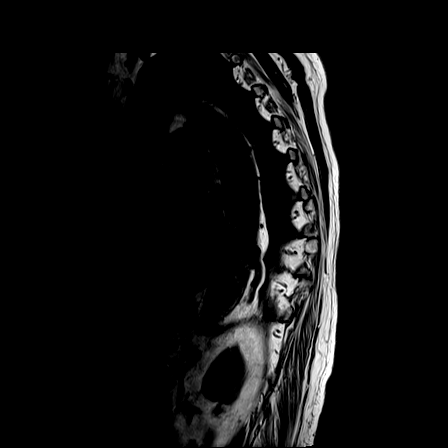

[Series 25: T2 · axial · 5.0mm · 0.59mm/px · z∈[-177,-1]mm · 8 of 35 slices shown (2 of 2)]
[im 1/35]
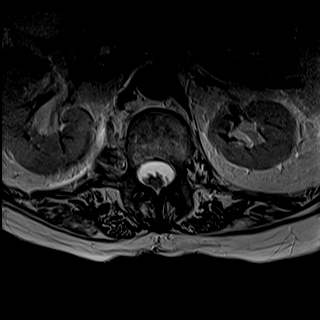
[im 5/35]
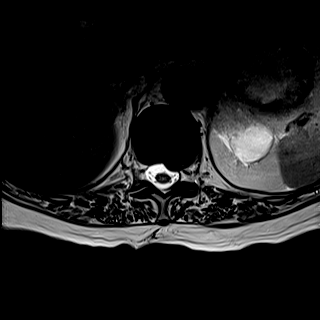
[im 10/35]
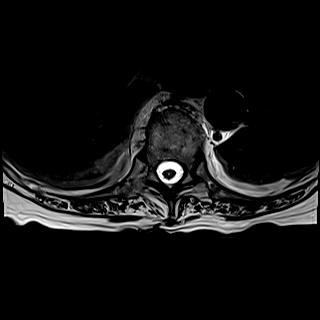
[im 15/35]
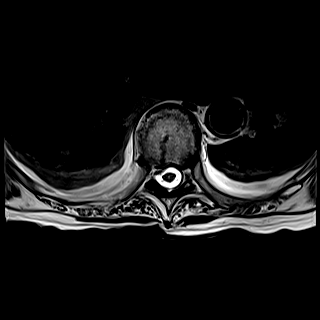
[im 20/35]
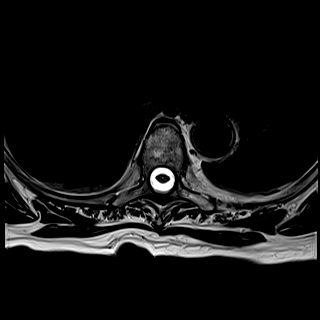
[im 25/35]
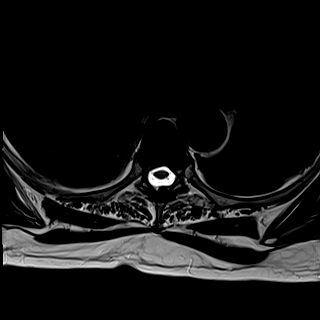
[im 30/35]
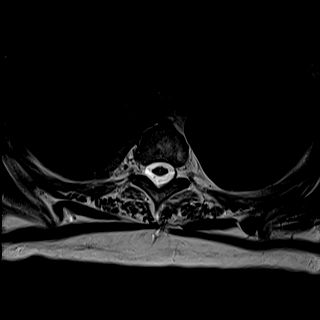
[im 35/35]
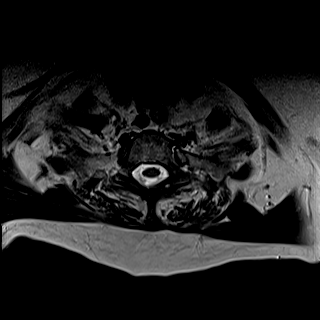

[Series 27: T1 · axial · non-contrast · 5.0mm · 0.31mm/px · z∈[-177,-40]mm · 6 of 35 slices shown (3 of 3)]
[im 1/35]
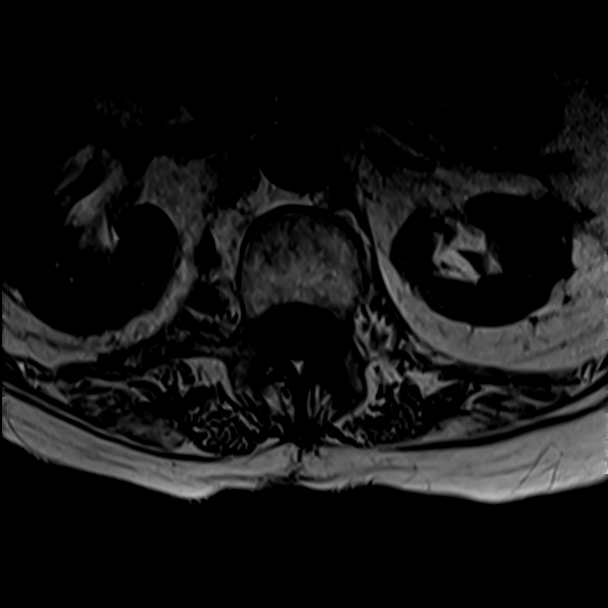
[im 5/35]
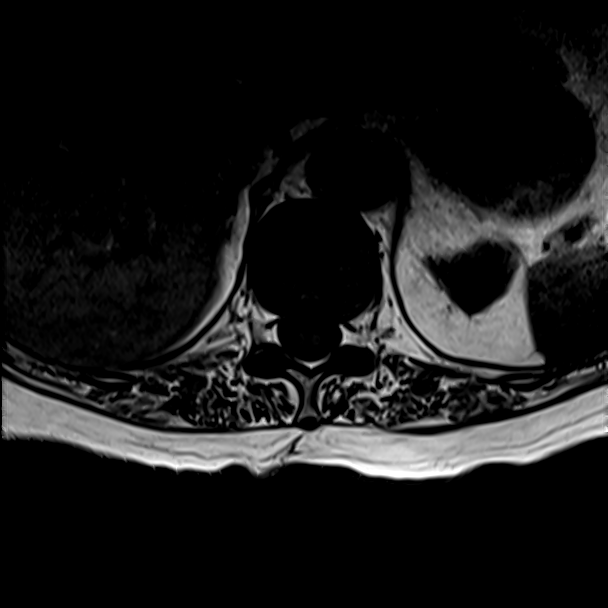
[im 10/35]
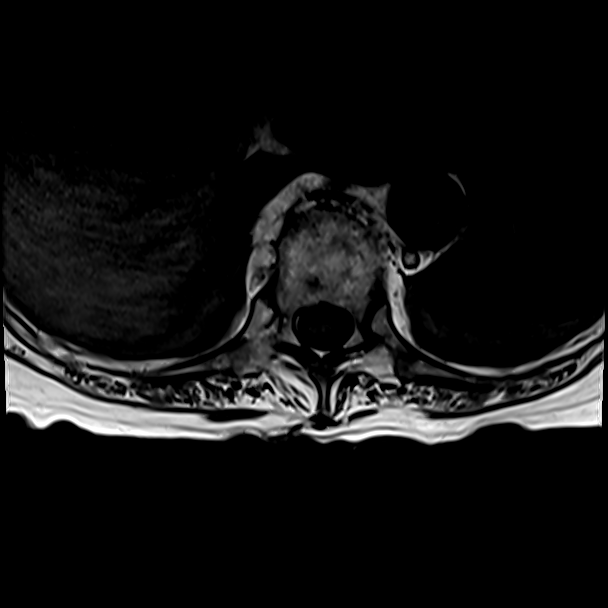
[im 15/35]
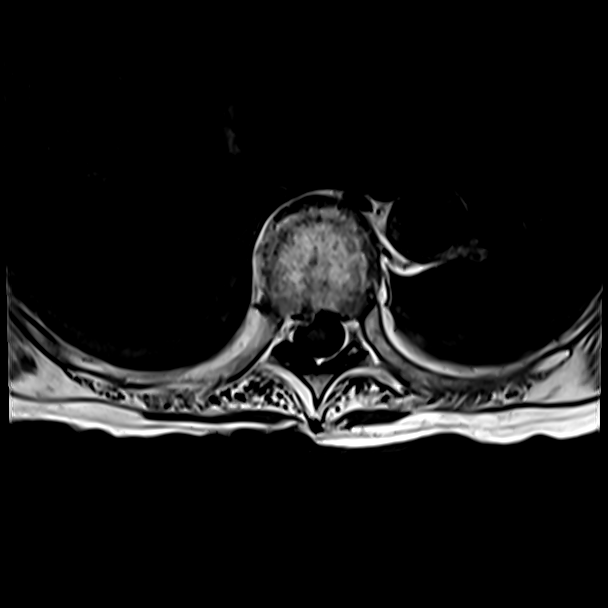
[im 20/35]
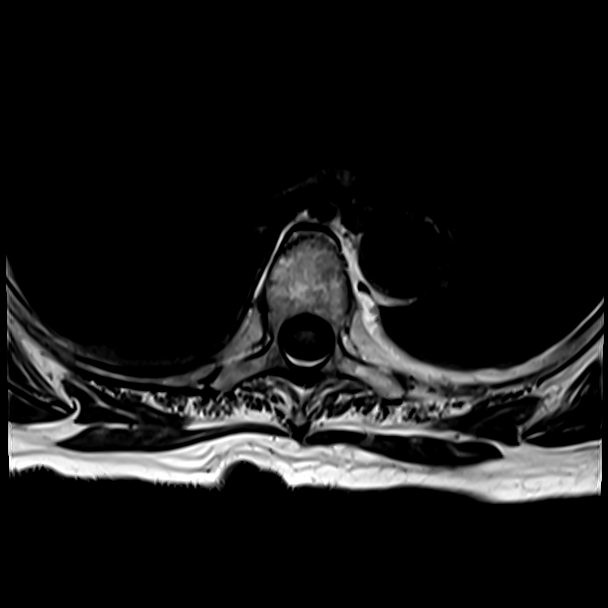
[im 25/35]
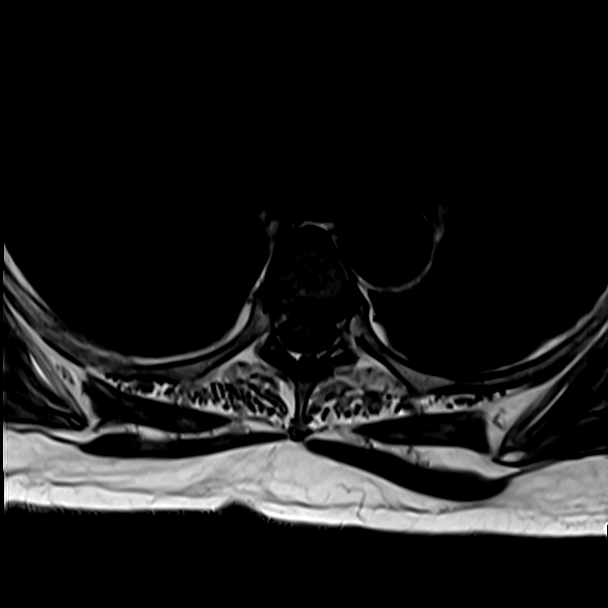

[22 of 48 positions shown; findings below may reference images not displayed]

FINDINGS: MRI THORACIC SPINE FINDINGS

Alignment:  Physiologic.

Vertebrae: No fracture, evidence of discitis, or bone lesion.

Cord:  Normal signal and morphology.

Paraspinal and other soft tissues: Small pleural effusions

Disc levels:

At T7-8, there is an intermediate sized central disc protrusion that
effaces the ventral thecal sac and contacts the anterior surface of
the spinal cord. There is no spinal canal stenosis.

At T8-9, there is a small right subarticular disc protrusion without
stenosis.

At T9-10, there is a small disc osteophyte complex, greatest in the
right subarticular zone, without spinal canal stenosis.

At T11-12, there is a left subarticular disc protrusion without
causing stenosis.
IMPRESSION: 1. No discitis-osteomyelitis or epidural abscess.
2. Midthoracic degenerative disc disease without spinal canal
stenosis.
3. Central disc protrusion at T7-8 contacting the ventral surface of
the spinal cord.

## 2018-11-18 MED ORDER — HYDROCODONE-ACETAMINOPHEN 5-325 MG PO TABS
1.0000 | ORAL_TABLET | Freq: Four times a day (QID) | ORAL | Status: DC | PRN
  Administered 2018-11-18 – 2018-11-24 (×12): 1 via ORAL
  Filled 2018-11-18 (×12): qty 1

## 2018-11-18 MED ORDER — LORAZEPAM 2 MG/ML IJ SOLN: 1.0000 mg | Freq: Once | INTRAMUSCULAR | Status: DC

## 2018-11-18 MED ORDER — GADOBUTROL 1 MMOL/ML IV SOLN
6.0000 mL | Freq: Once | INTRAVENOUS | Status: AC | PRN
  Administered 2018-11-18: 21:00:00 6 mL via INTRAVENOUS

## 2018-11-18 MED ORDER — HYDROCODONE-ACETAMINOPHEN 5-325 MG PO TABS: 1.0000 | ORAL_TABLET | Freq: Four times a day (QID) | ORAL | Status: DC | PRN

## 2018-11-18 NOTE — Progress Notes (Signed)
Pt given vicodin per orders with no relief, pt declines other pain medications at present.  BP 105/44, HR 96.  Lactic 3.3 - [facility] notified. 500 cc bolus infusing over 2 hours.  Will continue to monitor closely.

## 2018-11-18 NOTE — Progress Notes (Signed)
Discharge Summary  Autumn KillianKaren M [name] ZOX:[id number] DOB: January 30, 1933  PCP: Merlene LaughterStoneking, Hal, MD  Admit date: 11/12/2018  Discharge delayed on 11/15/18 due to overall generalized weakness.  Reassessed by PT OT who recommended SNF.  CSW consulted to assist with placement. No bed offers over the week-end. On 11/17/18, patient complained of 10/10 lower back pain similar to initial presentation but worse and relieved by IV pain medications and muscle relaxant. CT chest abd pelvis unrevealing.   On 11/18/18, consult placed for neurosurgery to further assess.    Discharge Diagnoses:  Active Hospital Problems   Diagnosis Date Noted   Pain of upper abdomen    Postural dizziness with near syncope 11/13/2018   Tachypnea    Nausea and vomiting 11/12/2018   Bilateral hydronephrosis 11/12/2018   Elevated troponin 11/12/2018   Lactic acidosis 11/12/2018   Chronic neck and back pain 11/12/2018   Essential hypertension 11/12/2018    Resolved Hospital Problems  No resolved problems to display.    Vitals:   11/18/18 0927 11/18/18 1300  BP: 120/64 (!) 116/54  Pulse:    Resp:    Temp:    SpO2:      History of present illness:  Autumn RaiderKaren M [name] a 85 y.o.femalewith medical history significant ofhypertension, hypothyroidism, LBBB,arthritis,andGERD;who presents with complaints of nausea and vomiting. Wilford GristYesterday,she began feeling lightheaded as though she may pass out and was unable to walk while brushing her teeth. She is able to sit down on the toilet in the bathroom and became nauseated and vomited. Associated symptoms include complaints of generalized malaise, shortness of breath, achy neck, and achy back pain. She reports that the back and neck pain complaints are not new and related to her history of arthritis. She has not had any significant cough, fever, headache, change in vision, focal weakness, dysuria, or change in bowel habits.  ED Course:Upon admission into the emergency  department patient was seen to be afebrile, pulse 80-106, respirations 15-30, blood pressures 137/94-172/74, and O2 saturation maintained on room air. Orthostatic vital signs were noted to be positive after standing for 3 minutes. Labs significant for MCV 103.4, MCH 34.4, sodium 131, chloride 97, CO2 18, BUN 21, creatinine 1.01, anion gap 16,lactic acid 4.6->3.8, and troponin42->76. CXRshowed no acute abnormality and UA negative for any signs of infection.Blood cultures were ordered. She was started on normal saline IV fluids at 100 mL/h.She has had no more nausea or vomiting since admission,but still does not feel well.  Hospital course complicated by elevated troponin, abnormal 2D echo with LVEF 25%, small to moderate pericardial effusion.  Seen by cardiology and patient declined aggressive cardiac work-up per cardiology.  Persistent abdominal discomfort. States up to 2 weeks prior to presentation was taking ibuprofen daily for months. Unintentional weight loss 5 lbs in the last 4-8 weeks. Seen by GI, declined EGD per GI.   Reassessed by PT with recs for SNF. CSW assisting with placement.  Hospital course complicated once more by severe lower back pain unrelieved by IV pain meds or muscle relaxants.  11/18/18: Patient was seen and examined at her bedside this morning.  Still reports 10 out of 10 pain in her lower back. Sensation and motor intact.  No rashes on her back per nurse.  Denies history of shingles.  CT chest, abdomen and pelvis done on 11/17/2018 unrevealing.  Neurosurgery consulted to further assess.  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 Seen by vascular surgery, unlikely reason for symptoms. Self-reported daily use of NSAID, ibuprofen for months and 5 pound unintentional weight loss the past 4 to 8 weeks Seen by GI but patient declined EGD. Continue Protonix 40 mg twice daily x6 weeks then 40 mg daily afterwards as recommended by GI Follow-up with GI outpatient  Severe intractable lower back pain in the setting of chronic back pain with unclear etiology CT chest abdomen and pelvis done on 11/17/2018 independently reviewed unrevealing Currently on IV Dilaudid as needed for severe pain, Vicodin for moderate pain, Robaxin for muscle spasms with no improvement of symptoms. Neurosurgery consulted on 11/18/2018 to further assess  Leukocytosis, unclear etiology Possibly reactive No clear evidence of active infective process Afebrile WBC 16,000 on 11/18/2018 from 13,000 on 11/17/2018 Elevated lactic acid, start gentle IV fluid hydration normal saline at 50 cc/h x 12 hours Negative procalcitonin Consult infectious disease to further assess  Acute on chronic combined systolic and diastolic CHF/small to moderate pericardial effusion. Seen by Dr. Antoine Poche, per review of medical records on 04/10/2014 she had an ejection fraction of 35%, had a left heart cath in September 02, 2013 with history of nonischemic cardiomyopathy 2D echo done on 11/13/2018 showed LVEF 25% with diffuse hypokinesis and septal lateral dyssynchrony consistent with LBBB as well as grade 1 diastolic dysfunction Seen by cardiology, patient declines aggressive cardiac work-up. Continue cardiac medications Follow-up with your cardiologist outpatient  Generalized weakness with physical debility likely in the setting of acute illness PT OT as tolerated Encourage oral protein calorie intake Continue Ensure BID Dietary consult  Essential hypertension complicated by orthostatic hypotension Continue cardiac medications No diuretics due to orthostatic  hypotension Euvolemic on exam Normal BNP 65 on 11/12/2018 Follow-up with cardiology  Elevated troponin, likely demand ischemia High-sensitivity troponin peaked at 196 and trended down Seen by cardiology, no plan for further ischemic work-up; patient declines aggressive cardiac work-up. Follow-up with cardiology outpatient Repeated troponin on 11/18/18, 47 and trending down  Recurrent lactic acidosis, unclear etiology  Presented with lactic acid 4.6 on 11/12/2018, improved post IV fluid hydration Lactic acid 1.7 on 11/13/2018  Lactic acid 3.3 on 11/17/2018>> 1.9>> 2.4 on 11/18/2018  Chronic euvolemic hyponatremia Asymptomatic Appears to have had hyponatremia since 2018 To be at her baseline with sodium 130 on 11/15/18. Follow-up with your PCP  Methicillin susceptible coagulase-negative staph bacteremia, likely contaminant Afebrile no leukocytosis Not toxic appearing  Resolved urinary retention with bilateral hydronephrosis Follow-up with urology; obtain referral from your primary care provider. No acute kidney injuries, creatinine stable 0.75 on 11/15/2018 and GFR greater than 60 on 11/15/18.  Hypothyroidism Continue levothyroxine  GERD Continue Protonix  Code Status:full  Consultants:  Cardiology  GI  Neurosurgery on 11/18/2018  Procedures:  echo    Discharge Exam: BP (!) 116/54    Pulse 97    Temp 98.5 F (36.9 C) (Oral)    Resp (!) 22    Ht  (1.6 m)    Wt 67.6 kg    SpO2 97%    BMI 26.40 kg/m   General: 85 y.o. year-old female well-developed well-nourished appears uncomfortable due to lower back pain.  Alert and oriented x3.  Cardiovascular: Regular rate and rhythm no rubs or  gallops no JVD or thyromegaly noted.    Respiratory: Clear to auscultation no wheezes or rales.  Poor inspiratory effort.  Abdomen: Soft nontender nondistended normal bowel sounds present.    Musculoskeletal: No lower extremity edema.  2 out of 4 pulses in all 4  extremities.    Psychiatry: Mood is appropriate for condition and setting.Marland Kitchen.  Discharge Instructions You were cared for by a hospitalist during your hospital stay. If you have any questions about your discharge medications or the care you received while you were in the hospital after you are discharged, you can call the unit and asked to speak with the hospitalist on call if the hospitalist that took care of you is not available. Once you are discharged, your primary care physician will handle any further medical issues. Please note that NO REFILLS for any discharge medications will be authorized once you are discharged, as it is imperative that you return to your primary care physician (or establish a relationship with a primary care physician if you do not have one) for your aftercare needs so that they can reassess your need for medications and monitor your lab values.   Allergies as of 11/18/2018      Reactions   Hydrochlorothiazide Other (See Comments)   REACTION: hyponatremia   Ramipril Other (See Comments)   REACTION: GI upset   Remeron [mirtazapine] Other (See Comments)   REACTION: weakness   Sulfa Antibiotics Rash      Medication List    STOP taking these medications   HYDROcodone-acetaminophen 5-325 MG tablet Commonly known as: NORCO/VICODIN   omeprazole 20 MG capsule Commonly known as: PRILOSEC     TAKE these medications   aspirin 325 MG tablet Take 1 tablet (325 mg total) by mouth daily.   carvedilol 12.5 MG tablet Commonly known as: COREG Take 1 tablet (12.5 mg total) by mouth 2 (two) times daily. What changed:   medication strength  how much to take   diclofenac sodium 1 % Gel Commonly known as: Voltaren Apply 4 g topically 4 (four) times daily. What changed:   when to take this  reasons to take this   estradiol 1 MG tablet Commonly known as: ESTRACE Take 1 mg by mouth daily.   HAIR SKIN AND NAILS FORMULA PO Take 1 tablet by mouth daily.     levothyroxine 50 MCG tablet Commonly known as: SYNTHROID Take 50 mcg by mouth daily before breakfast.   lidocaine 5 % Commonly known as: LIDODERM Place 1 patch onto the skin daily for 3 days. Remove & Discard patch within 12 hours or as directed by MD   LORazepam 0.5 MG tablet Commonly known as: ATIVAN Take 1 tablet (0.5 mg total) by mouth at bedtime as needed for sleep.   losartan 100 MG tablet Commonly known as: COZAAR Take 1 tablet (100 mg total) by mouth daily. What changed:   medication strength  how much to take   MELATONIN PO Take 1 mL by mouth at bedtime.   pantoprazole 40 MG tablet Commonly known as: PROTONIX Take 1 tablet (40 mg total) by mouth 2 (two) times daily. Take 1 tablet 40 mg BID x 6 weeks then 1 tab 40 mg daily afterwards   senna-docusate 8.6-50 MG tablet Commonly known as: Senokot-S Take 2 tablets by mouth 2 (two) times daily.   traMADol 50 MG tablet Commonly known as: ULTRAM Take 1 tablet (50 mg total) by mouth daily as needed for severe pain.   TURMERIC CURCUMIN PO  Take 1 tablet by mouth daily.   VITAMIN B-12 PO Take 1 tablet by mouth daily.   VITAMIN C PO Take 1 tablet by mouth daily.   VITAMIN D PO Take 1 tablet by mouth daily.            Durable Medical Equipment  (From admission, onward)         Start     Ordered   11/15/18 0600  For home use only DME Walker rolling  Once    Comments: 5" wheels  Question:  Patient needs a walker to treat with the following condition  Answer:  Ambulatory dysfunction   11/15/18 0601         Allergies  Allergen Reactions   Hydrochlorothiazide Other (See Comments)    REACTION: hyponatremia   Ramipril Other (See Comments)    REACTION: GI upset   Remeron [Mirtazapine] Other (See Comments)    REACTION: weakness   Sulfa Antibiotics Rash    Contact information for follow-up providers    Jodelle Gross, NP Follow up on 11/26/2018.   Specialties: Nurse Practitioner, Radiology,  Cardiology Why: Please arrive 15 minutes early for your 9:45am post-hospital cardiology follow-up appointment Contact information: [address] [place] [address] [phone]        [name], MD Follow up on 01/31/2019.   Specialty: Cardiology Why: Please arrive 15 minutes early for your 10am appointment Contact information: [address] [place] [address] [phone]            Contact information for after-discharge care    Destination    [facility] Preferred SNF .   Service: Skilled Nursing Contact information: [address] [place] [address] [phone]                   The results of significant diagnostics from this hospitalization (including imaging, microbiology, ancillary and laboratory) are listed below for reference.    Significant Diagnostic Studies: Ct Abdomen Pelvis Wo Contrast  Result Date: 11/17/2018 CLINICAL DATA:  85 year old female with history of chest and back pain. Suspected acute aortic dissection. EXAM: CT CHEST, ABDOMEN AND PELVIS WITHOUT CONTRAST TECHNIQUE: Multidetector CT imaging of the chest, abdomen and pelvis was performed following the standard protocol without IV contrast. COMPARISON:  Chest CTA 01/31/2017. CT the abdomen and pelvis 09/09/2008. FINDINGS: CT CHEST FINDINGS Cardiovascular: Heart size is normal. There is no significant pericardial fluid, thickening or pericardial calcification. There is aortic atherosclerosis, as well as atherosclerosis of the great vessels of the mediastinum and the coronary arteries, including calcified atherosclerotic plaque in the right coronary artery. No crescentic high attenuation associated with the wall of the thoracic aorta to suggest the presence of acute intramural hemorrhage. Accurate assessment for aortic dissection is not possible on today's noncontrast examination. Mediastinum/Nodes: No pathologically  enlarged mediastinal or hilar lymph nodes. Please note that accurate exclusion of hilar adenopathy is limited on noncontrast CT scans. Esophagus is unremarkable in appearance. No axillary lymphadenopathy. Lungs/Pleura: No acute consolidative airspace disease. No pleural effusions. Multiple linear areas of architectural distortion throughout the lungs bilaterally, favored to reflect areas of chronic post infectious or inflammatory scarring. No suspicious appearing pulmonary nodules or masses are noted. Musculoskeletal: There are no aggressive appearing lytic or blastic lesions noted in the visualized portions of the skeleton. CT ABDOMEN PELVIS FINDINGS Hepatobiliary: No definite suspicious cystic or solid hepatic lesions are confidently identified on today's noncontrast CT examination. Unenhanced appearance of the gallbladder  is normal. Pancreas: No definite pancreatic mass or peripancreatic fluid collections or inflammatory changes are noted on today's noncontrast examination. Spleen: Unremarkable. Adrenals/Urinary Tract: 2.6 cm exophytic low-attenuation lesion in upper pole of the left kidney, incompletely characterized on today's non-contrast CT examination, but statistically likely to represent a cyst. Unenhanced appearance of the right kidney and bilateral adrenal glands is normal. No hydroureteronephrosis. Urinary bladder is unremarkable in appearance. Stomach/Bowel: Unenhanced appearance of the stomach is normal. No pathologic dilatation of small bowel or colon. The appendix is not confidently identified and may be surgically absent. Regardless, there are no inflammatory changes noted adjacent to the cecum to suggest the presence of an acute appendicitis at this time. Vascular/Lymphatic: Aortic atherosclerosis. No crescentic high attenuation associated with the wall of the abdominal aorta to suggest acute intramural hematoma. No lymphadenopathy noted in the abdomen or pelvis. Reproductive: Status post  hysterectomy. Ovaries are not confidently identified and may be surgically absent or atrophic. Other: No significant volume of ascites.  No pneumoperitoneum. Musculoskeletal: There are no aggressive appearing lytic or blastic lesions noted in the visualized portions of the skeleton. IMPRESSION: 1. No definitive imaging findings on today's noncontrast CT examination to suggest acute aortic syndrome. 2. No acute findings are noted in the chest, abdomen or pelvis to account for the patient's symptoms. 3. Aortic atherosclerosis, in addition to right coronary artery disease. 4. Additional incidental findings, as above. Electronically Signed   By: Trudie Reed M.D.   On: 11/17/2018 17:42   Ct Chest Wo Contrast  Result Date: 11/17/2018 CLINICAL DATA:  85 year old female with history of chest and back pain. Suspected acute aortic dissection. EXAM: CT CHEST, ABDOMEN AND PELVIS WITHOUT CONTRAST TECHNIQUE: Multidetector CT imaging of the chest, abdomen and pelvis was performed following the standard protocol without IV contrast. COMPARISON:  Chest CTA 01/31/2017. CT the abdomen and pelvis 09/09/2008. FINDINGS: CT CHEST FINDINGS Cardiovascular: Heart size is normal. There is no significant pericardial fluid, thickening or pericardial calcification. There is aortic atherosclerosis, as well as atherosclerosis of the great vessels of the mediastinum and the coronary arteries, including calcified atherosclerotic plaque in the right coronary artery. No crescentic high attenuation associated with the wall of the thoracic aorta to suggest the presence of acute intramural hemorrhage. Accurate assessment for aortic dissection is not possible on today's noncontrast examination. Mediastinum/Nodes: No pathologically enlarged mediastinal or hilar lymph nodes. Please note that accurate exclusion of hilar adenopathy is limited on noncontrast CT scans. Esophagus is unremarkable in appearance. No axillary lymphadenopathy. Lungs/Pleura:  No acute consolidative airspace disease. No pleural effusions. Multiple linear areas of architectural distortion throughout the lungs bilaterally, favored to reflect areas of chronic post infectious or inflammatory scarring. No suspicious appearing pulmonary nodules or masses are noted. Musculoskeletal: There are no aggressive appearing lytic or blastic lesions noted in the visualized portions of the skeleton. CT ABDOMEN PELVIS FINDINGS Hepatobiliary: No definite suspicious cystic or solid hepatic lesions are confidently identified on today's noncontrast CT examination. Unenhanced appearance of the gallbladder is normal. Pancreas: No definite pancreatic mass or peripancreatic fluid collections or inflammatory changes are noted on today's noncontrast examination. Spleen: Unremarkable. Adrenals/Urinary Tract: 2.6 cm exophytic low-attenuation lesion in upper pole of the left kidney, incompletely characterized on today's non-contrast CT examination, but statistically likely to represent a cyst. Unenhanced appearance of the right kidney and bilateral adrenal glands is normal. No hydroureteronephrosis. Urinary bladder is unremarkable in appearance. Stomach/Bowel: Unenhanced appearance of the stomach is normal. No pathologic dilatation of small bowel or colon. The  appendix is not confidently identified and may be surgically absent. Regardless, there are no inflammatory changes noted adjacent to the cecum to suggest the presence of an acute appendicitis at this time. Vascular/Lymphatic: Aortic atherosclerosis. No crescentic high attenuation associated with the wall of the abdominal aorta to suggest acute intramural hematoma. No lymphadenopathy noted in the abdomen or pelvis. Reproductive: Status post hysterectomy. Ovaries are not confidently identified and may be surgically absent or atrophic. Other: No significant volume of ascites.  No pneumoperitoneum. Musculoskeletal: There are no aggressive appearing lytic or blastic  lesions noted in the visualized portions of the skeleton. IMPRESSION: 1. No definitive imaging findings on today's noncontrast CT examination to suggest acute aortic syndrome. 2. No acute findings are noted in the chest, abdomen or pelvis to account for the patient's symptoms. 3. Aortic atherosclerosis, in addition to right coronary artery disease. 4. Additional incidental findings, as above. Electronically Signed   By: Vinnie Langton M.D.   On: 11/17/2018 17:42   Dg Chest Port 1 View  Result Date: 11/12/2018 CLINICAL DATA:  Tachypnea EXAM: PORTABLE CHEST 1 VIEW COMPARISON:  01/30/2017 FINDINGS: Low volume chest with asymmetric elevation of the right diaphragm, chronic. Normal heart size and mediastinal contours for technique. There is no edema, consolidation, effusion, or pneumothorax. IMPRESSION: No evidence of acute disease. Electronically Signed   By: Monte Fantasia M.D.   On: 11/12/2018 05:51   Ct Angio Chest/abd/pel For Dissection W And/or Wo Contrast  Result Date: 11/12/2018 CLINICAL DATA:  Chest and back pain with aortic dissection suspected EXAM: CT ANGIOGRAPHY CHEST, ABDOMEN AND PELVIS TECHNIQUE: Multidetector CT imaging through the chest, abdomen and pelvis was performed using the standard protocol during bolus administration of intravenous contrast. Multiplanar reconstructed images and MIPs were obtained and reviewed to evaluate the vascular anatomy. CONTRAST:  10mL OMNIPAQUE IOHEXOL 350 MG/ML SOLN COMPARISON:  Abdomen and pelvis CT 09/09/2008.  Chest CTA 01/31/2017 FINDINGS: CTA CHEST FINDINGS Cardiovascular: Noncontrast phase shows no intramural hematoma no aortic dissection or aneurysm. No visible pulmonary artery filling defect. Scattered atherosclerotic calcification of the aorta. Right coronary calcified plaque is noted. Mild atheromatous narrowing at the proximal left subclavian artery. Mediastinum/Nodes: No adenopathy or mass. Lungs/Pleura: Mild dependent atelectasis and right upper  lobe scarring. There is no edema, consolidation, effusion, or pneumothorax. Musculoskeletal: Diffuse degenerative disc narrowing with endplate ridging. There is exaggerated thoracic kyphosis and mild scoliosis. Notable T7-8 and T8-9 central disc protrusion. Review of the MIP images confirms the above findings. CTA ABDOMEN AND PELVIS FINDINGS VASCULAR Aorta: Multifocal atherosclerotic plaque. No aneurysm or dissection. Celiac: High-grade narrowing at the celiac origin from atherosclerosis and likely from median arcuate ligament. SMA: Widely patent SMA. No branch occlusion or beading. Prominent pancreatic arcade compensating for the proximal celiac narrowing. Renals: Atherosclerotic plaque at both ostia without flow limiting stenosis. Early branching left renal artery. IMA: Patent Inflow: Atherosclerotic plaque without dissection or aneurysm. Veins: Negative in the arterial phase Review of the MIP images confirms the above findings. NON-VASCULAR Hepatobiliary: No focal liver abnormality.No evidence of biliary obstruction or stone. Pancreas: Unremarkable. Spleen: Unremarkable. Adrenals/Urinary Tract: Negative adrenals. Bilateral hydroureteronephrosis to the level of distended bladder. Stomach/Bowel:  No obstruction.  No evidence of inflammation. Lymphatic: No acute vascular abnormality.  No mass or adenopathy. Reproductive:Hysterectomy. Other: No ascites or pneumoperitoneum. Musculoskeletal: No acute abnormalities. Severe diffuse lumbar spine degeneration with dextroscoliosis. No acute osseous finding Review of the MIP images confirms the above findings. IMPRESSION: 1. No acute vascular finding including evidence of acute aortic syndrome.  2. Atherosclerosis with proximal celiac narrowing but widely patent SMA and peripancreatic arcade. 3. Distended bladder resulting in bilateral hydroureteronephrosis. Electronically Signed   By: Marnee Spring M.D.   On: 11/12/2018 06:50    Microbiology: Recent Results (from the  past 240 hour(s))  Culture, blood (routine x 2)     Status: Abnormal   Collection Time: 11/12/18  2:10 AM   Specimen: BLOOD  Result Value Ref Range Status   Specimen Description   Final    BLOOD RIGHT ANTECUBITAL Performed at [facility], [address], [place], [place] [address]    Special Requests   Final    BOTTLES DRAWN AEROBIC AND ANAEROBIC Blood Culture adequate volume Performed at [facility] [place] [facility], [address], [place], [place] [address]    Culture  Setup Time   Final    GRAM POSITIVE COCCI IN BOTH AEROBIC AND ANAEROBIC BOTTLES CRITICAL RESULT CALLED TO, READ BACK BY AND VERIFIED WITH: PHARMD T BAUMEISTER [id number] MLM    Culture (A)  Final    STAPHYLOCOCCUS SPECIES (COAGULASE NEGATIVE) THE SIGNIFICANCE OF ISOLATING THIS ORGANISM FROM A SINGLE SET OF BLOOD CULTURES WHEN MULTIPLE SETS ARE DRAWN IS UNCERTAIN. PLEASE NOTIFY THE MICROBIOLOGY DEPARTMENT WITHIN ONE WEEK IF SPECIATION AND SENSITIVITIES ARE REQUIRED. Performed at [facility], [address], [place], [place] [address]    Report Status 11/15/2018 FINAL  Final  Blood Culture ID Panel (Reflexed)     Status: Abnormal   Collection Time: 11/12/18  2:10 AM  Result Value Ref Range Status   Enterococcus species NOT DETECTED NOT DETECTED Final   Listeria monocytogenes NOT DETECTED NOT DETECTED Final   Staphylococcus species DETECTED (A) NOT DETECTED Final    Comment: Methicillin (oxacillin) susceptible coagulase negative staphylococcus. Possible blood culture contaminant (unless isolated from more than one blood culture draw or clinical case suggests pathogenicity). No antibiotic treatment is indicated for blood  culture contaminants. CRITICAL RESULT CALLED TO, READ BACK BY AND VERIFIED WITH: PHARMD T BAUMEISTER [id number] MLM    Staphylococcus aureus (BCID) NOT DETECTED NOT DETECTED Final   Methicillin resistance NOT DETECTED NOT DETECTED Final   Streptococcus species NOT DETECTED NOT  DETECTED Final   Streptococcus agalactiae NOT DETECTED NOT DETECTED Final   Streptococcus pneumoniae NOT DETECTED NOT DETECTED Final   Streptococcus pyogenes NOT DETECTED NOT DETECTED Final   Acinetobacter baumannii NOT DETECTED NOT DETECTED Final   Enterobacteriaceae species NOT DETECTED NOT DETECTED Final   Enterobacter cloacae complex NOT DETECTED NOT DETECTED Final   Escherichia coli NOT DETECTED NOT DETECTED Final   Klebsiella oxytoca NOT DETECTED NOT DETECTED Final   Klebsiella pneumoniae NOT DETECTED NOT DETECTED Final   Proteus species NOT DETECTED NOT DETECTED Final   Serratia marcescens NOT DETECTED NOT DETECTED Final   Haemophilus influenzae NOT DETECTED NOT DETECTED Final   Neisseria meningitidis NOT DETECTED NOT DETECTED Final   Pseudomonas aeruginosa NOT DETECTED NOT DETECTED Final   Candida albicans NOT DETECTED NOT DETECTED Final   Candida glabrata NOT DETECTED NOT DETECTED Final   Candida krusei NOT DETECTED NOT DETECTED Final   Candida parapsilosis NOT DETECTED NOT DETECTED Final   Candida tropicalis NOT DETECTED NOT DETECTED Final    Comment: Performed at [facility], [address], [place], [place] [address]  SARS CORONAVIRUS 2 (TAT 6-24 HRS) Nasopharyngeal Nasopharyngeal Swab     Status: None   Collection Time: 11/12/18  5:47 AM   Specimen: Nasopharyngeal Swab  Result Value Ref  Range Status   SARS Coronavirus 2 NEGATIVE NEGATIVE Final    Comment: (NOTE) SARS-CoV-2 target nucleic acids are NOT DETECTED. The SARS-CoV-2 RNA is generally detectable in upper and lower respiratory specimens during the acute phase of infection. Negative results do not preclude SARS-CoV-2 infection, do not rule out co-infections with other pathogens, and should not be used as the sole basis for treatment or other patient management decisions. Negative results must be combined with clinical observations, patient history, and epidemiological information. The expected result is  Negative. Fact Sheet for Patients: HairSlick.no Fact Sheet for Healthcare Providers: quierodirigir.com This test is not yet approved or cleared by the [place] FDA and  has been authorized for detection and/or diagnosis of SARS-CoV-2 by FDA under an Emergency Use Authorization (EUA). This EUA will remain  in effect (meaning this test can be used) for the duration of the COVID-19 declaration under Section 56 4(b)(1) of the Act, 21 U.S.C. section 360bbb-3(b)(1), unless the authorization is terminated or revoked sooner. Performed at [facility], [address] [place], [place] [address]   Culture, blood (routine x 2)     Status: None   Collection Time: 11/12/18  7:02 PM   Specimen: BLOOD LEFT HAND  Result Value Ref Range Status   Specimen Description BLOOD LEFT HAND  Final   Special Requests   Final    BOTTLES DRAWN AEROBIC ONLY Blood Culture adequate volume   Culture   Final    NO GROWTH 5 DAYS Performed at [facility], [address] [place], [place] [address]    Report Status 11/17/2018 FINAL  Final  Novel Coronavirus, NAA (Hosp order, Send-out to Ref Lab; TAT 18-24 hrs     Status: None   Collection Time: 11/16/18  4:51 PM   Specimen: Nasopharyngeal Swab; Respiratory  Result Value Ref Range Status   SARS-CoV-2, NAA NOT DETECTED NOT DETECTED Final    Comment: (NOTE) This nucleic acid amplification test was developed and its performance characteristics determined by [employer]. Nucleic acid amplification tests include PCR and TMA. This test has not been FDA cleared or approved. This test has been authorized by FDA under an Emergency Use Authorization (EUA). This test is only authorized for the duration of time the declaration that circumstances exist justifying the authorization of the emergency use of in vitro diagnostic tests for detection of SARS-CoV-2 virus and/or diagnosis of COVID-19  infection under section 564(b)(1) of the Act, 21 U.S.C. 098JXB-1(Y) (1), unless the authorization is terminated or revoked sooner. When diagnostic testing is negative, the possibility of a false negative result should be considered in the context of a patient's recent exposures and the presence of clinical signs and symptoms consistent with COVID-19. An individual without symptoms of COVID- 19 and who is not shedding SARS-CoV-2 vi rus would expect to have a negative (not detected) result in this assay. Performed At: [facility] [address] [place], [place] [id number] Jolene Schimke MD YQ:[id number]    Coronavirus Source NASOPHARYNGEAL  Final    Comment: Performed at [facility], [address], [place], [place] [address]  Culture, blood (routine x 2)     Status: None (Preliminary result)   Collection Time: 11/18/18  7:45 AM   Specimen: BLOOD LEFT ARM  Result Value Ref Range Status   Specimen Description BLOOD LEFT ARM  Final   Special Requests   Final    BOTTLES DRAWN AEROBIC ONLY Blood Culture results may not be optimal due  to an inadequate volume of blood received in culture bottles   Culture   Final    NO GROWTH < 12 HOURS Performed at [facility], [address], [place], [place] [address]    Report Status PENDING  Incomplete  Culture, blood (routine x 2)     Status: None (Preliminary result)   Collection Time: 11/18/18  7:51 AM   Specimen: BLOOD LEFT HAND  Result Value Ref Range Status   Specimen Description BLOOD LEFT HAND  Final   Special Requests   Final    BOTTLES DRAWN AEROBIC ONLY Blood Culture results may not be optimal due to an inadequate volume of blood received in culture bottles   Culture   Final    NO GROWTH < 12 HOURS Performed at [facility], [address], [place], [place] [address]    Report Status PENDING  Incomplete     Labs: Basic Metabolic Panel: Recent Labs  Lab 11/14/18 0304 11/15/18 0732 11/17/18 1243  11/17/18 1750 11/18/18 0531  NA 130* 130* 131* 131* 130*  K 3.7 3.7 4.1 4.9 4.0  CL 100 97* 99 94* 95*  CO2 22 22 24 27 23   GLUCOSE 107* 107* 136* 125* 133*  BUN 12 14 20 19 18   CREATININE 0.85 0.75 0.80 0.81 0.94  CALCIUM 8.7* 8.9 8.7* 9.4 8.8*   Liver Function Tests: Recent Labs  Lab 11/12/18 0210 11/14/18 0304 11/17/18 1750 11/18/18 0531  AST 26 26 32 37  ALT 16 15 25 23   ALKPHOS 42 35* 50 44  BILITOT 0.6 0.5 0.8 0.3  PROT 7.1 5.7* 7.1 6.2*  ALBUMIN 3.9 2.9* 3.3* 2.7*   Recent Labs  Lab 11/12/18 0210  LIPASE 25   No results for input(s): AMMONIA in the last 168 hours. CBC: Recent Labs  Lab 11/12/18 0210 11/12/18 1345 11/13/18 0009 11/17/18 1750 11/18/18 0531  WBC 9.6  --  8.4 13.0* 16.7*  NEUTROABS 6.2  --   --  10.4* 14.5*  HGB 12.3  --  12.5 13.5 12.1  HCT 37.0 34.5 35.2* 38.9 35.1*  MCV 103.4*  --  99.7 101.3* 100.6*  PLT 262  --  231 295 238   Cardiac Enzymes: Recent Labs  Lab 11/18/18 0531  CKTOTAL 428*   BNP: BNP (last 3 results) Recent Labs    11/12/18 0210  BNP 65.8    ProBNP (last 3 results) No results for input(s): PROBNP in the last 8760 hours.  CBG: Recent Labs  Lab 11/12/18 0208  GLUCAP 145*       Signed:  Darlin Drop, MD [facility] 11/18/2018, 2:33 PM

## 2018-11-18 NOTE — Care Management Important Message (Signed)
Important Message  Patient Details  Name: Autumn [name] MRN: [id number] Date of Birth: [birth year]   Medicare Important Message Given:  Yes     Shelda Altes 11/18/2018, 2:22 PM

## 2018-11-18 NOTE — Progress Notes (Signed)
PT Cancellation Note  Patient Details Name: Autumn [name] MRN: [id number] DOB: July 21, 1932   Cancelled Treatment:    Reason Eval/Treat Not Completed: Patient not medically ready. Pt in excruciated back and abdominal pain at 10/10 with elevated white count and ice cold feet. RN asked to hold as they are doing blood work and diagnostics trying to figure out source of pain. Acute PT to return as able, as appropriate to progress mobility.  [name], PT, DPT Acute Rehabilitation Services Pager #: [phone] Office #: [phone]    Berline Lopes 11/18/2018, 8:36 AM

## 2018-11-18 NOTE — Progress Notes (Signed)
Son by to see pt today, many concerns for pt. Pt is still having 10/10 lower back pain with any movement he said she has never had back pain like this before. Pt continues to not eat much at all and stomach is distended. Pt urine output is low but bladder scan is only 37ml at 1800. MRI ordered STAT and called MRI twice about getting pt down and stated it would be within the hour, MD aware.   Attempted to get pt OOB but after sitting on the side of the bed she refused due to back pain, movement and some stretched in the bed were completed instead.   Son stated that he does not want her to refuse PT anymore.

## 2018-11-18 NOTE — Plan of Care (Signed)
  Problem: Nutrition: Goal: Adequate nutrition will be maintained Outcome: Progressing   Problem: Coping: Goal: Level of anxiety will decrease Outcome: Progressing   

## 2018-11-19 ENCOUNTER — Inpatient Hospital Stay: Payer: Medicare Other

## 2018-11-19 ENCOUNTER — Inpatient Hospital Stay (HOSPITAL_COMMUNITY): Payer: Medicare Other

## 2018-11-19 ENCOUNTER — Encounter (HOSPITAL_COMMUNITY): Payer: Self-pay | Admitting: Infectious Disease

## 2018-11-19 DIAGNOSIS — M79609 Pain in unspecified limb: Secondary | ICD-10-CM

## 2018-11-19 DIAGNOSIS — I63542 Cerebral infarction due to unspecified occlusion or stenosis of left cerebellar artery: Secondary | ICD-10-CM

## 2018-11-19 DIAGNOSIS — I361 Nonrheumatic tricuspid (valve) insufficiency: Secondary | ICD-10-CM

## 2018-11-19 DIAGNOSIS — Z881 Allergy status to other antibiotic agents status: Secondary | ICD-10-CM

## 2018-11-19 DIAGNOSIS — E039 Hypothyroidism, unspecified: Secondary | ICD-10-CM

## 2018-11-19 DIAGNOSIS — R7881 Bacteremia: Secondary | ICD-10-CM

## 2018-11-19 DIAGNOSIS — Z888 Allergy status to other drugs, medicaments and biological substances status: Secondary | ICD-10-CM

## 2018-11-19 DIAGNOSIS — I639 Cerebral infarction, unspecified: Secondary | ICD-10-CM

## 2018-11-19 DIAGNOSIS — M1990 Unspecified osteoarthritis, unspecified site: Secondary | ICD-10-CM

## 2018-11-19 DIAGNOSIS — I447 Left bundle-branch block, unspecified: Secondary | ICD-10-CM

## 2018-11-19 DIAGNOSIS — B9562 Methicillin resistant Staphylococcus aureus infection as the cause of diseases classified elsewhere: Secondary | ICD-10-CM

## 2018-11-19 DIAGNOSIS — K219 Gastro-esophageal reflux disease without esophagitis: Secondary | ICD-10-CM

## 2018-11-19 DIAGNOSIS — M48061 Spinal stenosis, lumbar region without neurogenic claudication: Secondary | ICD-10-CM

## 2018-11-19 DIAGNOSIS — M5124 Other intervertebral disc displacement, thoracic region: Secondary | ICD-10-CM

## 2018-11-19 DIAGNOSIS — Z791 Long term (current) use of non-steroidal anti-inflammatories (NSAID): Secondary | ICD-10-CM

## 2018-11-19 DIAGNOSIS — I76 Septic arterial embolism: Secondary | ICD-10-CM

## 2018-11-19 DIAGNOSIS — I951 Orthostatic hypotension: Secondary | ICD-10-CM

## 2018-11-19 DIAGNOSIS — R634 Abnormal weight loss: Secondary | ICD-10-CM

## 2018-11-19 HISTORY — DX: Methicillin resistant Staphylococcus aureus infection as the cause of diseases classified elsewhere: B95.62

## 2018-11-19 HISTORY — DX: Bacteremia: R78.81

## 2018-11-19 LAB — ECHOCARDIOGRAM COMPLETE
Height: 63 in
Weight: 2398.6 oz

## 2018-11-19 LAB — LIPID PANEL
Cholesterol: 178 mg/dL (ref 0–200)
HDL: 66 mg/dL (ref >40)
LDL Cholesterol: 89 mg/dL (ref 0–99)
Total CHOL/HDL Ratio: 2.7 RATIO
Triglycerides: 114 mg/dL (ref <150)
VLDL: 23 mg/dL (ref 0–40)

## 2018-11-19 LAB — HEMOGLOBIN A1C
Hgb A1c MFr Bld: 5.9 % — ABNORMAL HIGH (ref 4.8–5.6)
Mean Plasma Glucose: 122.63 mg/dL

## 2018-11-19 LAB — MRSA PCR SCREENING: MRSA by PCR: NEGATIVE

## 2018-11-19 IMAGING — CT CT HEAD W/O CM
4 series · 16 of 47 positions shown, 18 images · non-contrast
Comparison: None.

CLINICAL DATA: 86-year-old with acute encephalopathy. She has Staph
aureus sepsis/bacteremia.

EXAM:
CT HEAD WITHOUT CONTRAST
TECHNIQUE: Contiguous axial images were obtained from the base of the skull
through the vertex without intravenous contrast.

[Series 3: head without · axial · non-contrast · 0.42mm/px · z∈[-75,+45]mm · 7 of 33 slices shown, 9 images]
[im 5/33  brain]
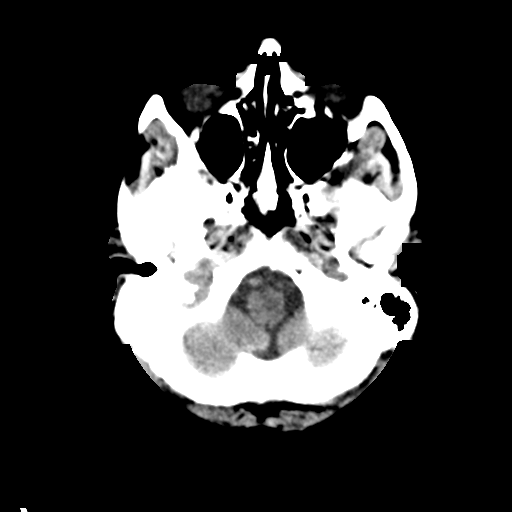
[im 5/33  bone]
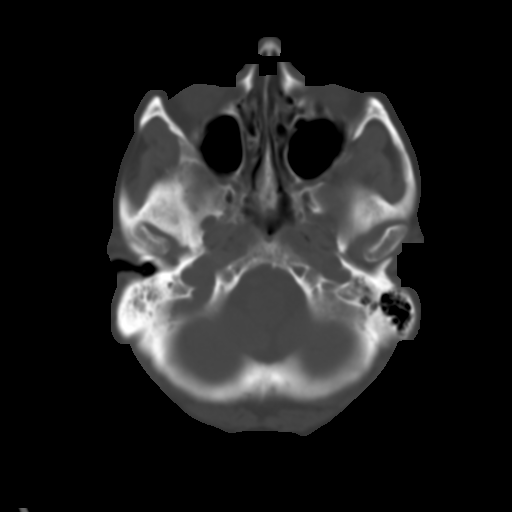
[im 9/33  brain]
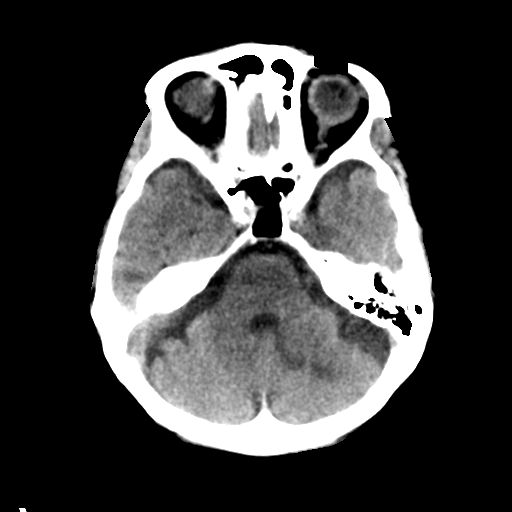
[im 13/33  brain]
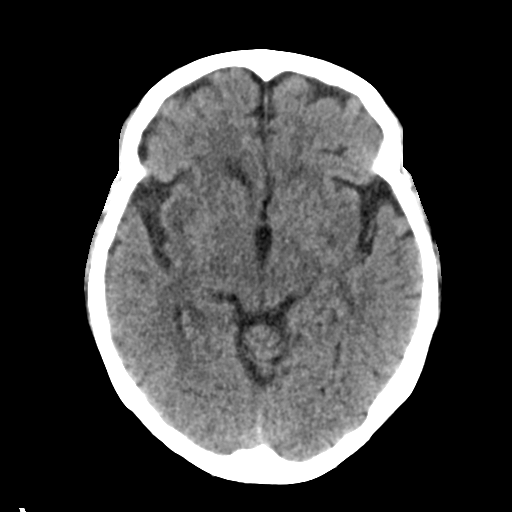
[im 17/33  brain]
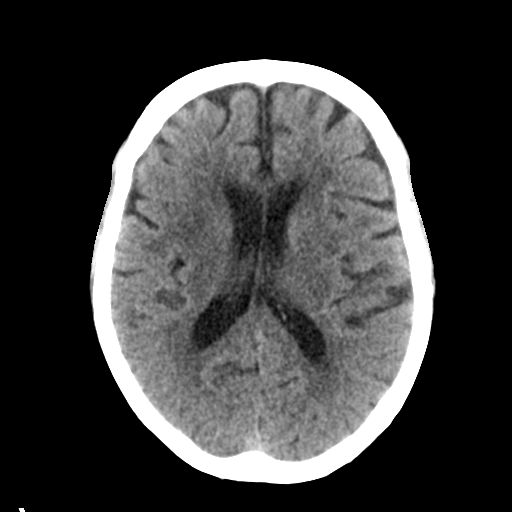
[im 21/33  brain]
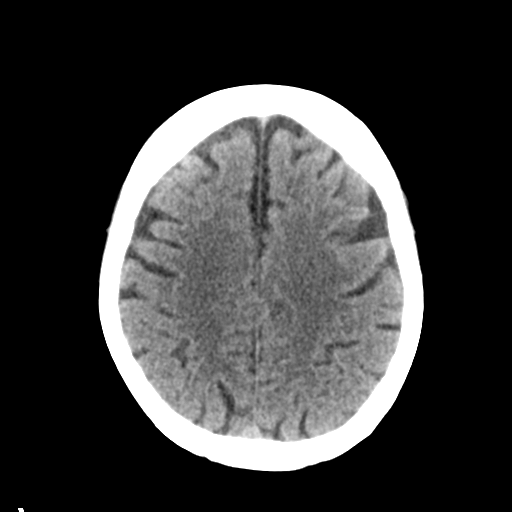
[im 21/33  bone]
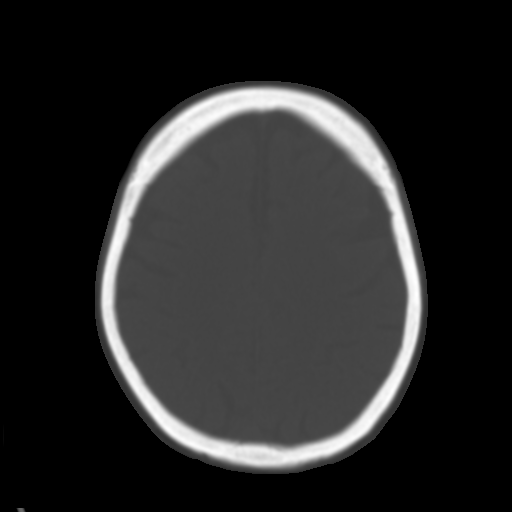
[im 25/33  brain]
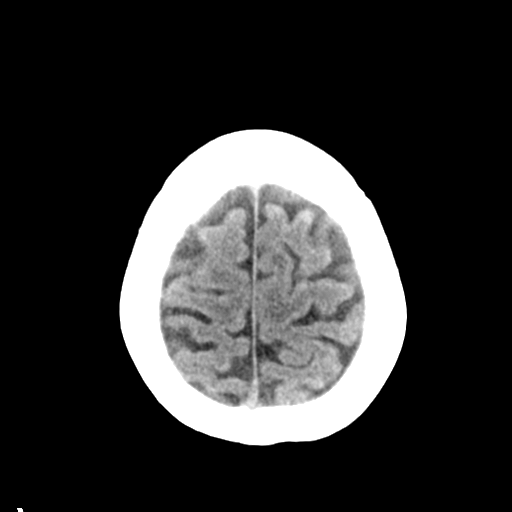
[im 29/33  brain]
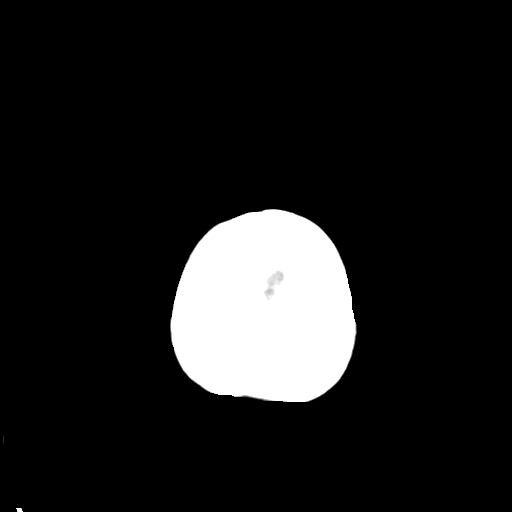

[Series 4: head bone · axial · 0.42mm/px · z∈[-79,-47]mm · 3 of 83 slices shown]
[im 9/83  bone]
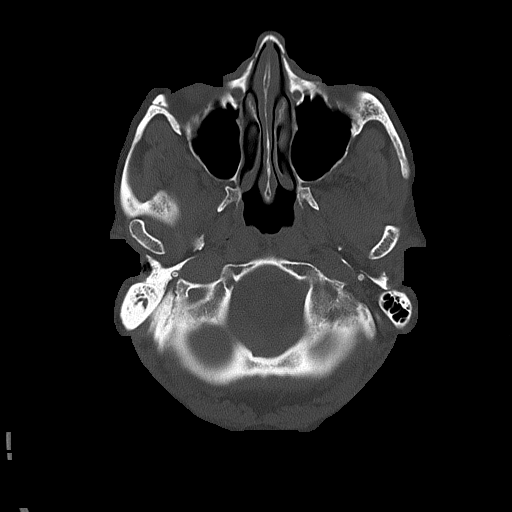
[im 17/83  bone]
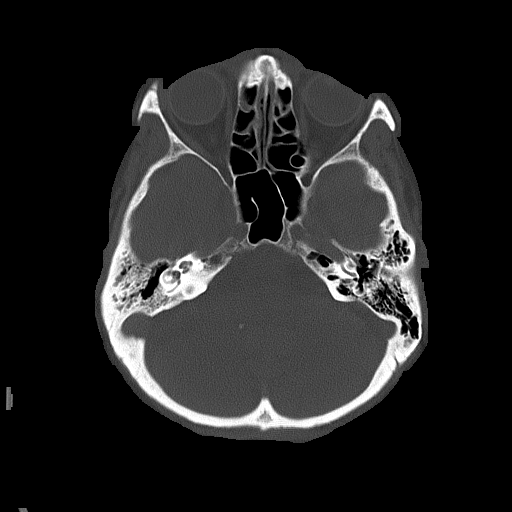
[im 25/83  bone]
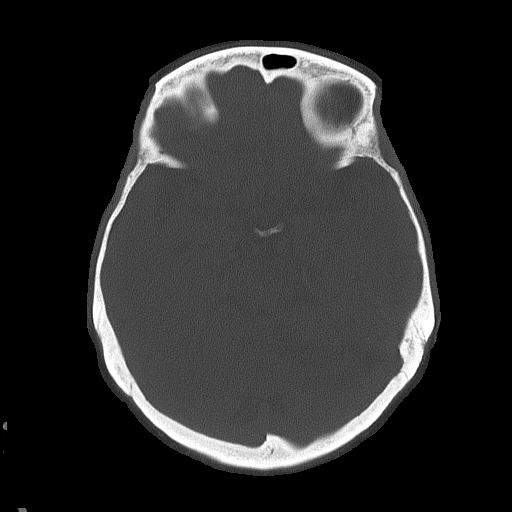

[Series 5: head without cor · coronal · non-contrast · 0.31mm/px · 3 of 65 slices shown]
[im 22/65  brain]
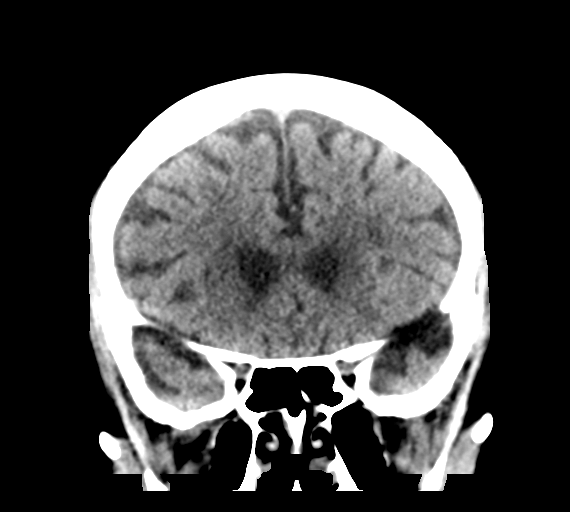
[im 29/65  brain]
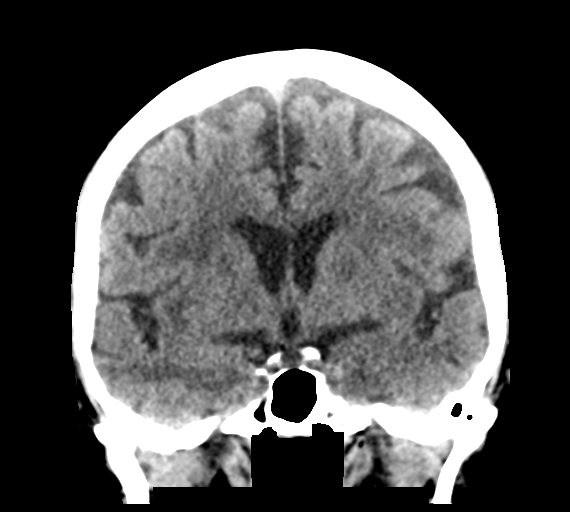
[im 36/65  brain]
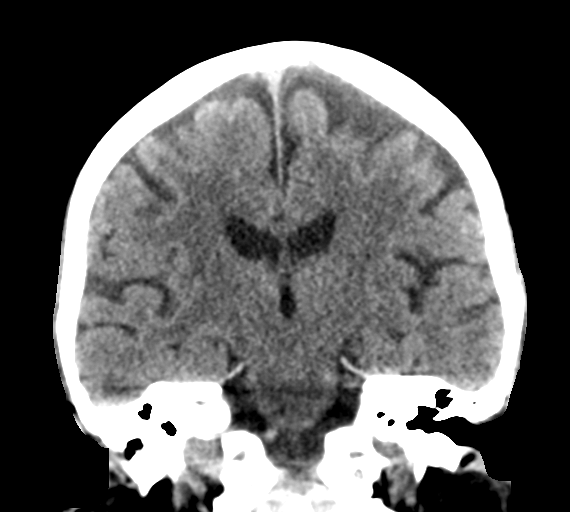

[Series 6: head without sag · sagittal · non-contrast · 0.31mm/px · 3 of 62 slices shown]
[im 21/62  brain]
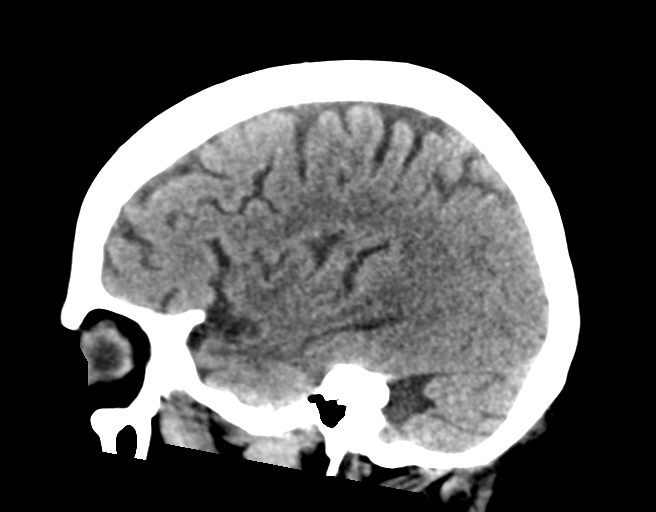
[im 31/62  brain]
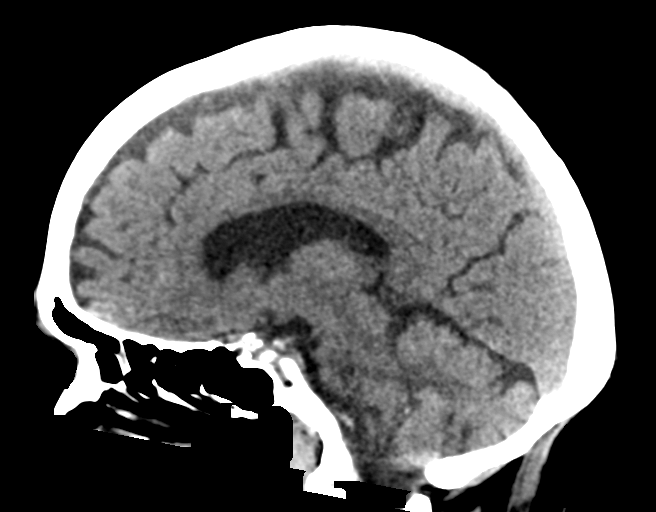
[im 41/62  brain]
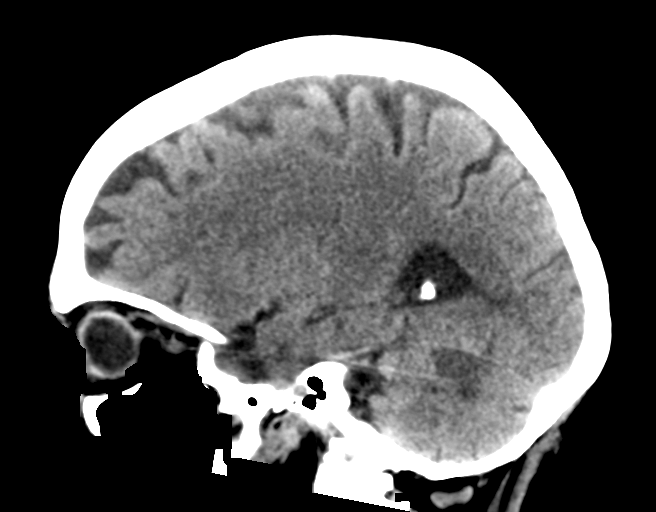

[16 of 47 positions shown; findings below may reference images not displayed]

FINDINGS: Brain: Ventricular system normal in size and appearance for age.
Mild age-appropriate cortical atrophy. Geographic low attenuation
involving the LEFT superior cerebellar hemisphere extending into the
LEFT cerebellar peduncle. No associated hemorrhage. Mild changes of
small vessel disease of the white matter diffusely. No acute
hemorrhage or hematoma. No extra-axial fluid collections.

Vascular: Moderate BILATERAL carotid siphon and LEFT vertebral
artery atherosclerosis. No hyperdense vessel.

Skull: No skull fracture or other focal osseous abnormality
involving the skull.

Sinuses/Orbits: Visualized paranasal sinuses, bilateral mastoid air
cells and bilateral middle ear cavities well-aerated. Visualized
orbits and globes normal in appearance.

Other: None.
IMPRESSION: 1. Acute/subacute nonhemorrhagic stroke involving the LEFT superior
cerebellar hemisphere extending into the LEFT cerebellar peduncle.
2. Mild age-appropriate cortical atrophy and mild chronic
microvascular ischemic changes of the white matter.

These results will be called to the ordering clinician or
representative by the Radiologist Assistant, and communication
documented in the PACS or zVision Dashboard.

## 2018-11-19 MED ORDER — ASPIRIN EC 325 MG PO TBEC
325.0000 mg | DELAYED_RELEASE_TABLET | Freq: Every day | ORAL | Status: DC
  Administered 2018-11-20 – 2018-11-28 (×9): 325 mg via ORAL
  Filled 2018-11-19 (×9): qty 1

## 2018-11-19 MED ORDER — VANCOMYCIN HCL IN DEXTROSE 750-5 MG/150ML-% IV SOLN
750.0000 mg | INTRAVENOUS | Status: DC
  Filled 2018-11-19: qty 150

## 2018-11-19 MED ORDER — VANCOMYCIN HCL 10 G IV SOLR
1750.0000 mg | Freq: Once | INTRAVENOUS | Status: AC
  Administered 2018-11-19: 1750 mg via INTRAVENOUS
  Filled 2018-11-19: qty 1750

## 2018-11-19 MED ORDER — ATORVASTATIN CALCIUM 10 MG PO TABS
20.0000 mg | ORAL_TABLET | Freq: Every day | ORAL | Status: DC
  Administered 2018-11-20 – 2018-11-26 (×7): 20 mg via ORAL
  Filled 2018-11-19 (×8): qty 2

## 2018-11-19 MED ORDER — SODIUM CHLORIDE 0.9 % IV SOLN
INTRAVENOUS | Status: DC
  Administered 2018-11-20: 06:00:00 via INTRAVENOUS

## 2018-11-19 NOTE — Progress Notes (Signed)
Physical Therapy Treatment Patient Details Name: Autumn [name] MRN: [id number] DOB: 10/27/1932 Today's Date: 11/19/2018    History of Present Illness 85 y.o. female with past medical history significant for HTN, LBBB, and GERD who presents to the ED via EMS from [facility] center for acute onset neck and back pain as well as vomiting. In ED became orthostatic with 3 minutes of standing and found to have elevated troponing levels. Admitted for observation 9/29.    PT Comments    Pt greatly limited by 10/10 low back pain with all movement. Pt now requiring maxA for bed mobility and to transfer to EOB, unable to tolerate standing today. Pt reports constant pressure on R lower back. RN notified. Acute PT to cont to follow.    Follow Up Recommendations  SNF;Supervision for mobility/OOB     Equipment Recommendations  Rolling walker with 5" wheels    Recommendations for Other Services       Precautions / Restrictions Precautions Precautions: Fall Precaution Comments: severe back pain Restrictions Weight Bearing Restrictions: No    Mobility  Bed Mobility Overal bed mobility: Needs Assistance Bed Mobility: Rolling;Sidelying to Sit;Sit to Sidelying Rolling: Max assist Sidelying to sit: Max assist     Sit to sidelying: Max assist General bed mobility comments: pt with minimal assist, very guarded due to severe onset of back pain with any movement, maxA for trunk elevation and LE management back into bed  Transfers                 General transfer comment: unable this date due to 10/10 back pain  Ambulation/Gait             General Gait Details: unable this date due to 10/10 back pain   Stairs             Wheelchair Mobility    Modified Rankin (Stroke Patients Only)       Balance Overall balance assessment: Needs assistance Sitting-balance support: Feet supported;No upper extremity supported(pt hugging pillow the whole time) Sitting  balance-Leahy Scale: Fair         Standing balance comment: didn't assess today                            Cognition Arousal/Alertness: Awake/alert Behavior During Therapy: Flat affect;WFL for tasks assessed/performed Overall Cognitive Status: Impaired/Different from baseline Area of Impairment: Problem solving;Following commands;Safety/judgement                       Following Commands: Follows one step commands with increased time;Follows one step commands inconsistently     Problem Solving: Slow processing;Requires verbal cues;Requires tactile cues General Comments: pt with limited conversation, followed commands but slow      Exercises      General Comments General comments (skin integrity, edema, etc.): VSS however severe back pain limiting all mobility      Pertinent Vitals/Pain Pain Assessment: 0-10 Pain Score: 8  Pain Location: low back Pain Descriptors / Indicators: Pressure;Constant Pain Intervention(s): Limited activity within patient's tolerance    Home Living                      Prior Function            PT Goals (current goals can now be found in the care plan section) Progress towards PT goals: Not progressing toward goals - comment(due to onset of 10/10  back pain)    Frequency    Min 3X/week      PT Plan Discharge plan needs to be updated    Co-evaluation              AM-PAC PT "6 Clicks" Mobility   Outcome Measure  Help needed turning from your back to your side while in a flat bed without using bedrails?: A Lot Help needed moving from lying on your back to sitting on the side of a flat bed without using bedrails?: A Lot Help needed moving to and from a bed to a chair (including a wheelchair)?: A Lot Help needed standing up from a chair using your arms (e.g., wheelchair or bedside chair)?: Total Help needed to walk in hospital room?: Total Help needed climbing 3-5 steps with a railing? : Total 6  Click Score: 9    End of Session   Activity Tolerance: No increased pain;Patient limited by lethargy Patient left: in bed;with call bell/phone within reach;with bed alarm set Nurse Communication: Mobility status PT Visit Diagnosis: Muscle weakness (generalized) (M62.81);Difficulty in walking, not elsewhere classified (R26.2);Other abnormalities of gait and mobility (R26.89)     Time: 1093-2355 PT Time Calculation (min) (ACUTE ONLY): 18 min  Charges:  $Therapeutic Activity: 8-22 mins                     Autumn [name], PT, DPT Acute Rehabilitation Services Pager #: [phone] Office #: [phone]    Autumn [name] 11/19/2018, 10:14 AM

## 2018-11-19 NOTE — Progress Notes (Signed)
  Echocardiogram 2D Echocardiogram has been performed.  Autumn [name] 11/19/2018, 5:18 PM

## 2018-11-19 NOTE — Progress Notes (Addendum)
Progress Note  Autumn [name]:[id number] DOB: [birth year]  PCP: Merlene Laughter, MD  Admit date: 11/12/2018  Discharge delayed on 11/15/18 due to overall generalized weakness.  Reassessed by PT OT who recommended SNF.  CSW consulted to assist with placement. No bed offers over the week-end. On 11/17/18, patient complained of 10/10 lower back pain similar to initial presentation but worse unrelieved by IV pain medications and muscle relaxant. CT chest abd pelvis unrevealing.  MRI spine done on 11/18/18 showed stenosis at L3-L4 with disc bulge and central disc protrusion at T7-8.  Neurosurgery consulted and will see in consultation.  Blood cultures taken on 11/18/2018 2/2 positive for staph aureus, awaiting sensitivities.  Infectious disease will see in consultation.  CT head done on 11/19/18 showed acute/subacute left cerebellar ischemic CVA.    Discharge Diagnoses:  Active Hospital Problems   Diagnosis Date Noted   Pain of upper abdomen    Postural dizziness with near syncope 11/13/2018   Tachypnea    Nausea and vomiting 11/12/2018   Bilateral hydronephrosis 11/12/2018   Elevated troponin 11/12/2018   Lactic acidosis 11/12/2018   Chronic neck and back pain 11/12/2018   Essential hypertension 11/12/2018    Resolved Hospital Problems  No resolved problems to display.    Vitals:   11/18/18 2138 11/19/18 0345  BP: 127/71 (!) 146/68  Pulse: 96 92  Resp: 20 16  Temp: 99.2 F (37.3 C) 99.1 F (37.3 C)  SpO2: 92% 92%    History of present illness:  Autumn [name] a 85 y.o.femalewith medical history significant ofhypertension, hypothyroidism, LBBB,arthritis,andGERD;who presents with complaints of nausea and vomiting. [name] began feeling lightheaded as though she may pass out and was unable to walk while brushing her teeth. She is able to sit down on the toilet in the bathroom and became nauseated and vomited. Associated symptoms include complaints of  generalized malaise, shortness of breath, achy neck, and achy back pain. She reports that the back and neck pain complaints are not new and related to her history of arthritis. She has not had any significant cough, fever, headache, change in vision, focal weakness, dysuria, or change in bowel habits.  ED Course:Upon admission into the emergency department patient was seen to be afebrile, pulse 80-106, respirations 15-30, blood pressures 137/94-172/74, and O2 saturation maintained on room air. Orthostatic vital signs were noted to be positive after standing for 3 minutes. Labs significant for MCV 103.4, MCH 34.4, sodium 131, chloride 97, CO2 18, BUN 21, creatinine 1.01, anion gap 16,lactic acid 4.6->3.8, and troponin42->76. CXRshowed no acute abnormality and UA negative for any signs of infection.Blood cultures were ordered. She was started on normal saline IV fluids at 100 mL/h.She has had no more nausea or vomiting since admission,but still does not feel well.  Hospital course complicated by elevated troponin, abnormal 2D echo with LVEF 25%, small to moderate pericardial effusion.  Seen by cardiology and patient declined aggressive cardiac work-up per cardiology.  Persistent abdominal discomfort. States up to 2 weeks prior to presentation was taking ibuprofen daily for months. Unintentional weight loss 5 lbs in the last 4-8 weeks. Seen by GI, declined EGD per GI.   Reassessed by PT with recs for SNF. CSW assisting with placement.  Hospital course complicated once more by severe lower back pain unrelieved by IV pain meds or muscle relaxants.  11/18/18: Patient was seen and examined at her bedside this morning.  Still reports 10 out of 10 pain in her lower back. Sensation and motor  intact.  No rashes on her back per nurse.  Denies history of shingles.  CT chest, abdomen and pelvis done on 11/17/2018 unrevealing.  MRI spine showed severe stenosis at L3-L4 with disc bulge and central disc  protrusion at T7-8.  11/19/18: Patient was seen and examined at her bedside.  Persistent Lower back pain despite pain medications.  Currently 8 out of 10.  Positive blood cultures staph aureus, 2/2 bottles .    Slower mentation today per Dr. Venetia Maxon which prompted CT head done on 11/19/18>> left acute/subacute cerebellar ischemic infarct. Discussed with Dr. Wilford Corner, stroke team attending, who recommended MRI brain without contrast. No aspirin or chemical DVT ppx in case this is 2/2 to endocarditis.   Will need to r/o endocarditis. Will consult cardio for possible TEE.   Hospital Course:  Active Problems:   Nausea and vomiting   Bilateral hydronephrosis   Elevated troponin   Lactic acidosis   Chronic neck and back pain   Essential hypertension   Tachypnea   Postural dizziness with near syncope   Pain of upper abdomen    Acute/ subacute ischemic left cerebellar CVA Seen on CT head no contrast done on 11/19/18, personally reviewed as well Discussed with neurology stroke team attending Dr. 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 Seen by vascular surgery, unlikely reason for symptoms. Self-reported daily use of NSAID, ibuprofen for months and 5 pound unintentional weight loss the past 4 to 8 weeks Seen by GI but patient declined EGD. Continue Protonix 40 mg twice daily x6 weeks then 40 mg daily afterwards as recommended by GI Follow-up with GI outpatient  Intractable lower back pain in the setting of chronic back pain likely multifactorial secondary to severe stenosis at L3-L4 and Staphylococcus aureus bacteremia CT chest abdomen and pelvis done on 11/17/2018  independently reviewed unrevealing MRI thoracic lumbar and sacrum done on 11/18/2018 showed severe stenosis at L3-L4 with disc bulge and central disc protrusion at T7-8. Neurosurgery has been consulted and will see in consultation. Continue pain management Currently on IV Dilaudid as needed for severe pain, Vicodin for moderate pain, Robaxin for muscle spasms with no improvement of symptoms.  Sepsis secondary to staph aureus bacteremia 2 out of 2 bottles positive Leukocytosis, tachycardia, and elevated lactic acid on 11/18/2018 Blood culture drawn on 11/18/2018 grew staph aureus Awaiting sensitivities Infectious disease has been contacted and will see in consultation Repeat blood cultures x2 peripherally on 11/20/2018 Cardiology contacted for TEE planned on 11/20/18- NPO after midnight. Started IV vancomycin on 11/19/2018, will be managed by pharmacy; monitor renal function. Repeat CBC with differential tomorrow morning  Newly diagnosed staph aureus bacteremia, awaiting sensitivities Management as per above  Acute on chronic combined systolic and diastolic CHF/small to moderate pericardial effusion. Seen by Dr. Antoine Poche, per review of medical records on 04/10/2014 she had an ejection fraction of 35%, had a left heart cath in September 02, 2013 with history of nonischemic cardiomyopathy 2D echo done on 11/13/2018 showed LVEF 25% with diffuse hypokinesis and septal lateral dyssynchrony consistent with LBBB as well as grade 1 diastolic dysfunction Seen by cardiology, patient declines aggressive cardiac work-up. Continue cardiac medications Follow-up with your cardiologist outpatient  Generalized weakness with  physical debility likely in the setting of acute illness PT OT as tolerated PT OT recommended SNF CSW consulted to assist with placement Encourage oral protein calorie intake Continue Ensure BID Dietary consult  Essential hypertension complicated by orthostatic hypotension Continue cardiac  medications No diuretics due to orthostatic hypotension Euvolemic on exam Normal BNP 65 on 11/12/2018 Follow-up with cardiology  Elevated troponin, likely demand ischemia High-sensitivity troponin peaked at 196 and trended down Seen by cardiology, no plan for further ischemic work-up; patient declines aggressive cardiac work-up. Follow-up with cardiology outpatient Repeated troponin on 11/18/18, 47 and trending down  Chronic euvolemic hyponatremia Asymptomatic Appears to have had hyponatremia since 2018 At her baseline with sodium 130 on 11/15/18. Repeat BMP in the morning  Resolved urinary retention with bilateral hydronephrosis Follow-up with urology; obtain referral from your primary care provider. No acute kidney injuries, creatinine stable 0.75 on 11/15/2018 and GFR greater than 60 on 11/15/18.  Hypothyroidism Continue levothyroxine  GERD Continue Protonix  Code Status:full  Consultants:  Cardiology  GI  Neurosurgery  Infectious disease  Procedures:  Echo    Discharge Exam: BP (!) 146/68    Pulse 92    Temp 99.1 F (37.3 C) (Oral)    Resp 16    Ht 5\' 3"  (1.6 m)    Wt 68 kg    SpO2 92%    BMI 26.56 kg/m   General: 85 y.o. year-old female developed well-nourished appears uncomfortable due to lower back pain.    Cardiovascular: Regular rate and rhythm no rubs or gallops no JVD thyromegaly.  Respiratory: Clear to auscultation no wheezes or rales. Poor inspiratory effort.  Abdomen: Mildly distended bowel sounds present.  Nontender with palpation.    Musculoskeletal: No lower extremity edema.  2 out of 4 pulses in all 4 extremities.    Psychiatry: Mood is appropriate for condition and setting.  Discharge Instructions You were cared for by a hospitalist during your hospital stay. If you have any questions about your discharge medications or the care you received while you were in the hospital after you are discharged, you can call the unit and asked to  speak with the hospitalist on call if the hospitalist that took care of you is not available. Once you are discharged, your primary care physician will handle any further medical issues. Please note that NO REFILLS for any discharge medications will be authorized once you are discharged, as it is imperative that you return to your primary care physician (or establish a relationship with a primary care physician if you do not have one) for your aftercare needs so that they can reassess your need for medications and monitor your lab values.   Allergies as of 11/19/2018      Reactions   Hydrochlorothiazide Other (See Comments)   REACTION: hyponatremia   Ramipril Other (See Comments)   REACTION: GI upset   Remeron [mirtazapine] Other (See Comments)   REACTION: weakness   Sulfa Antibiotics Rash      Medication List    STOP taking these medications   HYDROcodone-acetaminophen 5-325 MG tablet Commonly known as: NORCO/VICODIN   omeprazole 20 MG capsule Commonly known as: PRILOSEC     TAKE these medications   aspirin 325 MG tablet Take 1 tablet (325 mg total) by mouth daily.   carvedilol 12.5 MG tablet Commonly known as: COREG Take 1 tablet (12.5 mg total) by mouth 2 (two) times daily. What changed:   medication strength  how much to take   diclofenac  sodium 1 % Gel Commonly known as: Voltaren Apply 4 g topically 4 (four) times daily. What changed:   when to take this  reasons to take this   estradiol 1 MG tablet Commonly known as: ESTRACE Take 1 mg by mouth daily.   HAIR SKIN AND NAILS FORMULA PO Take 1 tablet by mouth daily.   levothyroxine 50 MCG tablet Commonly known as: SYNTHROID Take 50 mcg by mouth daily before breakfast.   lidocaine 5 % Commonly known as: LIDODERM Place 1 patch onto the skin daily for 3 days. Remove & Discard patch within 12 hours or as directed by MD   LORazepam 0.5 MG tablet Commonly known as: ATIVAN Take 1 tablet (0.5 mg total) by mouth  at bedtime as needed for sleep.   losartan 100 MG tablet Commonly known as: COZAAR Take 1 tablet (100 mg total) by mouth daily. What changed:   medication strength  how much to take   MELATONIN PO Take 1 mL by mouth at bedtime.   pantoprazole 40 MG tablet Commonly known as: PROTONIX Take 1 tablet (40 mg total) by mouth 2 (two) times daily. Take 1 tablet 40 mg BID x 6 weeks then 1 tab 40 mg daily afterwards   senna-docusate 8.6-50 MG tablet Commonly known as: Senokot-S Take 2 tablets by mouth 2 (two) times daily.   traMADol 50 MG tablet Commonly known as: ULTRAM Take 1 tablet (50 mg total) by mouth daily as needed for severe pain.   TURMERIC CURCUMIN PO Take 1 tablet by mouth daily.   VITAMIN B-12 PO Take 1 tablet by mouth daily.   VITAMIN C PO Take 1 tablet by mouth daily.   VITAMIN D PO Take 1 tablet by mouth daily.            Durable Medical Equipment  (From admission, onward)         Start     Ordered   11/15/18 0600  For home use only DME Walker rolling  Once    Comments: 5" wheels  Question:  Patient needs a walker to treat with the following condition  Answer:  Ambulatory dysfunction   11/15/18 0601         Allergies  Allergen Reactions   Hydrochlorothiazide Other (See Comments)    REACTION: hyponatremia   Ramipril Other (See Comments)    REACTION: GI upset   Remeron [Mirtazapine] Other (See Comments)    REACTION: weakness   Sulfa Antibiotics Rash    Contact information for follow-up providers    Jodelle Gross, NP Follow up on 11/26/2018.   Specialties: Nurse Practitioner, Radiology, Cardiology Why: Please arrive 15 minutes early for your 9:45am post-hospital cardiology follow-up appointment Contact information: [address] [place] [address] [phone]        [name], MD Follow up on 01/31/2019.   Specialty: Cardiology Why: Please arrive 15 minutes early for your 10am appointment Contact  information: [address] [place] [address] [phone]            Contact information for after-discharge care    Destination    [facility] Preferred SNF .   Service: Skilled Nursing Contact information: [address] [place] [address] [phone]                   The results of significant diagnostics from this hospitalization (including imaging, microbiology, ancillary and laboratory) are listed below for reference.  Significant Diagnostic Studies: Ct Abdomen Pelvis Wo Contrast  Result Date: 11/17/2018 CLINICAL DATA:  85 year old female with history of chest and back pain. Suspected acute aortic dissection. EXAM: CT CHEST, ABDOMEN AND PELVIS WITHOUT CONTRAST TECHNIQUE: Multidetector CT imaging of the chest, abdomen and pelvis was performed following the standard protocol without IV contrast. COMPARISON:  Chest CTA 01/31/2017. CT the abdomen and pelvis 09/09/2008. FINDINGS: CT CHEST FINDINGS Cardiovascular: Heart size is normal. There is no significant pericardial fluid, thickening or pericardial calcification. There is aortic atherosclerosis, as well as atherosclerosis of the great vessels of the mediastinum and the coronary arteries, including calcified atherosclerotic plaque in the right coronary artery. No crescentic high attenuation associated with the wall of the thoracic aorta to suggest the presence of acute intramural hemorrhage. Accurate assessment for aortic dissection is not possible on today's noncontrast examination. Mediastinum/Nodes: No pathologically enlarged mediastinal or hilar lymph nodes. Please note that accurate exclusion of hilar adenopathy is limited on noncontrast CT scans. Esophagus is unremarkable in appearance. No axillary lymphadenopathy. Lungs/Pleura: No acute consolidative airspace disease. No pleural effusions. Multiple linear areas of architectural distortion throughout the lungs  bilaterally, favored to reflect areas of chronic post infectious or inflammatory scarring. No suspicious appearing pulmonary nodules or masses are noted. Musculoskeletal: There are no aggressive appearing lytic or blastic lesions noted in the visualized portions of the skeleton. CT ABDOMEN PELVIS FINDINGS Hepatobiliary: No definite suspicious cystic or solid hepatic lesions are confidently identified on today's noncontrast CT examination. Unenhanced appearance of the gallbladder is normal. Pancreas: No definite pancreatic mass or peripancreatic fluid collections or inflammatory changes are noted on today's noncontrast examination. Spleen: Unremarkable. Adrenals/Urinary Tract: 2.6 cm exophytic low-attenuation lesion in upper pole of the left kidney, incompletely characterized on today's non-contrast CT examination, but statistically likely to represent a cyst. Unenhanced appearance of the right kidney and bilateral adrenal glands is normal. No hydroureteronephrosis. Urinary bladder is unremarkable in appearance. Stomach/Bowel: Unenhanced appearance of the stomach is normal. No pathologic dilatation of small bowel or colon. The appendix is not confidently identified and may be surgically absent. Regardless, there are no inflammatory changes noted adjacent to the cecum to suggest the presence of an acute appendicitis at this time. Vascular/Lymphatic: Aortic atherosclerosis. No crescentic high attenuation associated with the wall of the abdominal aorta to suggest acute intramural hematoma. No lymphadenopathy noted in the abdomen or pelvis. Reproductive: Status post hysterectomy. Ovaries are not confidently identified and may be surgically absent or atrophic. Other: No significant volume of ascites.  No pneumoperitoneum. Musculoskeletal: There are no aggressive appearing lytic or blastic lesions noted in the visualized portions of the skeleton. IMPRESSION: 1. No definitive imaging findings on today's noncontrast CT  examination to suggest acute aortic syndrome. 2. No acute findings are noted in the chest, abdomen or pelvis to account for the patient's symptoms. 3. Aortic atherosclerosis, in addition to right coronary artery disease. 4. Additional incidental findings, as above. Electronically Signed   By: Trudie Reed M.D.   On: 11/17/2018 17:42   Ct Chest Wo Contrast  Result Date: 11/17/2018 CLINICAL DATA:  85 year old female with history of chest and back pain. Suspected acute aortic dissection. EXAM: CT CHEST, ABDOMEN AND PELVIS WITHOUT CONTRAST TECHNIQUE: Multidetector CT imaging of the chest, abdomen and pelvis was performed following the standard protocol without IV contrast. COMPARISON:  Chest CTA 01/31/2017. CT the abdomen and pelvis 09/09/2008. FINDINGS: CT CHEST FINDINGS Cardiovascular: Heart size is normal. There is no significant pericardial fluid, thickening or pericardial calcification. There is  aortic atherosclerosis, as well as atherosclerosis of the great vessels of the mediastinum and the coronary arteries, including calcified atherosclerotic plaque in the right coronary artery. No crescentic high attenuation associated with the wall of the thoracic aorta to suggest the presence of acute intramural hemorrhage. Accurate assessment for aortic dissection is not possible on today's noncontrast examination. Mediastinum/Nodes: No pathologically enlarged mediastinal or hilar lymph nodes. Please note that accurate exclusion of hilar adenopathy is limited on noncontrast CT scans. Esophagus is unremarkable in appearance. No axillary lymphadenopathy. Lungs/Pleura: No acute consolidative airspace disease. No pleural effusions. Multiple linear areas of architectural distortion throughout the lungs bilaterally, favored to reflect areas of chronic post infectious or inflammatory scarring. No suspicious appearing pulmonary nodules or masses are noted. Musculoskeletal: There are no aggressive appearing lytic or blastic  lesions noted in the visualized portions of the skeleton. CT ABDOMEN PELVIS FINDINGS Hepatobiliary: No definite suspicious cystic or solid hepatic lesions are confidently identified on today's noncontrast CT examination. Unenhanced appearance of the gallbladder is normal. Pancreas: No definite pancreatic mass or peripancreatic fluid collections or inflammatory changes are noted on today's noncontrast examination. Spleen: Unremarkable. Adrenals/Urinary Tract: 2.6 cm exophytic low-attenuation lesion in upper pole of the left kidney, incompletely characterized on today's non-contrast CT examination, but statistically likely to represent a cyst. Unenhanced appearance of the right kidney and bilateral adrenal glands is normal. No hydroureteronephrosis. Urinary bladder is unremarkable in appearance. Stomach/Bowel: Unenhanced appearance of the stomach is normal. No pathologic dilatation of small bowel or colon. The appendix is not confidently identified and may be surgically absent. Regardless, there are no inflammatory changes noted adjacent to the cecum to suggest the presence of an acute appendicitis at this time. Vascular/Lymphatic: Aortic atherosclerosis. No crescentic high attenuation associated with the wall of the abdominal aorta to suggest acute intramural hematoma. No lymphadenopathy noted in the abdomen or pelvis. Reproductive: Status post hysterectomy. Ovaries are not confidently identified and may be surgically absent or atrophic. Other: No significant volume of ascites.  No pneumoperitoneum. Musculoskeletal: There are no aggressive appearing lytic or blastic lesions noted in the visualized portions of the skeleton. IMPRESSION: 1. No definitive imaging findings on today's noncontrast CT examination to suggest acute aortic syndrome. 2. No acute findings are noted in the chest, abdomen or pelvis to account for the patient's symptoms. 3. Aortic atherosclerosis, in addition to right coronary artery disease. 4.  Additional incidental findings, as above. Electronically Signed   By: Trudie Reed M.D.   On: 11/17/2018 17:42   Mr Thoracic Spine W Wo Contrast  Result Date: 11/18/2018 CLINICAL DATA:  Back pain EXAM: MRI THORACIC WITHOUT AND WITH CONTRAST TECHNIQUE: Multiplanar and multiecho pulse sequences of the thoracic spine were obtained without and with intravenous contrast. CONTRAST:  6mL GADAVIST GADOBUTROL 1 MMOL/ML IV SOLN COMPARISON:  None. FINDINGS: MRI THORACIC SPINE FINDINGS Alignment:  Physiologic. Vertebrae: No fracture, evidence of discitis, or bone lesion. Cord:  Normal signal and morphology. Paraspinal and other soft tissues: Small pleural effusions Disc levels: At T7-8, there is an intermediate sized central disc protrusion that effaces the ventral thecal sac and contacts the anterior surface of the spinal cord. There is no spinal canal stenosis. At T8-9, there is a small right subarticular disc protrusion without stenosis. At T9-10, there is a small disc osteophyte complex, greatest in the right subarticular zone, without spinal canal stenosis. At T11-12, there is a left subarticular disc protrusion without causing stenosis. IMPRESSION: 1. No discitis-osteomyelitis or epidural abscess. 2. Midthoracic degenerative disc disease  without spinal canal stenosis. 3. Central disc protrusion at T7-8 contacting the ventral surface of the spinal cord. Electronically Signed   By: Deatra Robinson M.D.   On: 11/18/2018 21:51   Mr Lumbar Spine W Wo Contrast  Result Date: 11/18/2018 CLINICAL DATA:  Back pain. Nausea and vomiting. EXAM: MRI LUMBAR SPINE WITHOUT AND WITH CONTRAST TECHNIQUE: Multiplanar and multiecho pulse sequences of the lumbar spine were obtained without and with intravenous contrast. CONTRAST:  23mL GADAVIST GADOBUTROL 1 MMOL/ML IV SOLN COMPARISON:  None. FINDINGS: Segmentation:  Normal Alignment:  Dextroscoliosis with apex at L3 Vertebrae: No acute compression fracture, discitis-osteomyelitis,  facet edema or other focal marrow lesion. No epidural collection. Conus medullaris and cauda equina: Conus extends to the L2 level. Conus and cauda equina appear normal. Paraspinal and other soft tissues: Negative. Disc levels: T12-L1: Normal disc space and facets. No spinal canal or neuroforaminal stenosis. L1-L2: Disc space narrowing with endplate spurring. No spinal canal stenosis. Severe right neural foraminal stenosis. L2-L3: Disc space narrowing with endplate spurring. Moderate bilateral neural foraminal stenosis. Mild facet hypertrophy. No central spinal canal stenosis. L3-L4: Disc space narrowing with diffuse bulge. Moderate facet hypertrophy. Mild spinal canal stenosis with severe narrowing of the left lateral recess. Mild right and severe left neural foraminal stenosis. L4-L5: Small disc bulge with mild facet hypertrophy. No spinal canal stenosis. Mild right and severe left neural foraminal stenosis. L5-S1: Small disc bulge with severe right facet hypertrophy. There is severe right neural foraminal stenosis and narrowing of the right lateral recess. No central spinal canal or left neural foraminal stenosis. Visualized sacrum: Normal. IMPRESSION: 1. No epidural abscess or discitis-osteomyelitis. 2. 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 3. Multilevel moderate-to-severe neural foraminal stenosis. Electronically Signed   By: Deatra Robinson M.D.   On: 11/18/2018 21:44   Mr Sacrum Si Joints W Wo Contrast  Result Date: 11/18/2018 CLINICAL DATA:  Back pain.  Possible infection. EXAM: MRI PELVIS WITHOUT AND WITH CONTRAST TECHNIQUE: Multiplanar multisequence MR imaging of the pelvis was performed both before and after administration of intravenous contrast. CONTRAST:  74mL GADAVIST GADOBUTROL 1 MMOL/ML IV SOLN COMPARISON:  CT scan from yesterday. FINDINGS: The bony structures are intact. No stress fracture or bone lesion. No findings suggests  sacroiliitis or septic arthritis. Mild inflammatory changes involving the right piriformis muscle and also the upper aspect of the right iliopsoas muscle. This could reflect nonspecific myositis. Both hips are normally located. Small joint effusions but no findings suspicious for septic arthritis. No evidence of avascular necrosis. No significant intrapelvic abnormalities are identified. IMPRESSION: 1. No findings suspicious for septic arthritis or osteomyelitis involving the sacrum or SI joints or hip joints. 2. Areas of nonspecific myositis mainly involving the right piriformis muscle and the right iliopsoas muscle. 3. No significant intrapelvic abnormalities. Electronically Signed   By: Rudie Meyer M.D.   On: 11/18/2018 21:36   Dg Chest Port 1 View  Result Date: 11/12/2018 CLINICAL DATA:  Tachypnea EXAM: PORTABLE CHEST 1 VIEW COMPARISON:  01/30/2017 FINDINGS: Low volume chest with asymmetric elevation of the right diaphragm, chronic. Normal heart size and mediastinal contours for technique. There is no edema, consolidation, effusion, or pneumothorax. IMPRESSION: No evidence of acute disease. Electronically Signed   By: Marnee Spring M.D.   On: 11/12/2018 05:51   Ct Angio Chest/abd/pel For Dissection W And/or Wo Contrast  Result Date: 11/12/2018 CLINICAL DATA:  Chest and back pain with aortic dissection suspected  EXAM: CT ANGIOGRAPHY CHEST, ABDOMEN AND PELVIS TECHNIQUE: Multidetector CT imaging through the chest, abdomen and pelvis was performed using the standard protocol during bolus administration of intravenous contrast. Multiplanar reconstructed images and MIPs were obtained and reviewed to evaluate the vascular anatomy. CONTRAST:  80mL OMNIPAQUE IOHEXOL 350 MG/ML SOLN COMPARISON:  Abdomen and pelvis CT 09/09/2008.  Chest CTA 01/31/2017 FINDINGS: CTA CHEST FINDINGS Cardiovascular: Noncontrast phase shows no intramural hematoma no aortic dissection or aneurysm. No visible pulmonary artery filling  defect. Scattered atherosclerotic calcification of the aorta. Right coronary calcified plaque is noted. Mild atheromatous narrowing at the proximal left subclavian artery. Mediastinum/Nodes: No adenopathy or mass. Lungs/Pleura: Mild dependent atelectasis and right upper lobe scarring. There is no edema, consolidation, effusion, or pneumothorax. Musculoskeletal: Diffuse degenerative disc narrowing with endplate ridging. There is exaggerated thoracic kyphosis and mild scoliosis. Notable T7-8 and T8-9 central disc protrusion. Review of the MIP images confirms the above findings. CTA ABDOMEN AND PELVIS FINDINGS VASCULAR Aorta: Multifocal atherosclerotic plaque. No aneurysm or dissection. Celiac: High-grade narrowing at the celiac origin from atherosclerosis and likely from median arcuate ligament. SMA: Widely patent SMA. No branch occlusion or beading. Prominent pancreatic arcade compensating for the proximal celiac narrowing. Renals: Atherosclerotic plaque at both ostia without flow limiting stenosis. Early branching left renal artery. IMA: Patent Inflow: Atherosclerotic plaque without dissection or aneurysm. Veins: Negative in the arterial phase Review of the MIP images confirms the above findings. NON-VASCULAR Hepatobiliary: No focal liver abnormality.No evidence of biliary obstruction or stone. Pancreas: Unremarkable. Spleen: Unremarkable. Adrenals/Urinary Tract: Negative adrenals. Bilateral hydroureteronephrosis to the level of distended bladder. Stomach/Bowel:  No obstruction.  No evidence of inflammation. Lymphatic: No acute vascular abnormality.  No mass or adenopathy. Reproductive:Hysterectomy. Other: No ascites or pneumoperitoneum. Musculoskeletal: No acute abnormalities. Severe diffuse lumbar spine degeneration with dextroscoliosis. No acute osseous finding Review of the MIP images confirms the above findings. IMPRESSION: 1. No acute vascular finding including evidence of acute aortic syndrome. 2.  Atherosclerosis with proximal celiac narrowing but widely patent SMA and peripancreatic arcade. 3. Distended bladder resulting in bilateral hydroureteronephrosis. Electronically Signed   By: Marnee SpringJonathon  Watts M.D.   On: 11/12/2018 06:50    Microbiology: Recent Results (from the past 240 hour(s))  Culture, blood (routine x 2)     Status: Abnormal   Collection Time: 11/12/18  2:10 AM   Specimen: BLOOD  Result Value Ref Range Status   Specimen Description   Final    BLOOD RIGHT ANTECUBITAL Performed at [facility], [address] [place], [place] [address]    Special Requests   Final    BOTTLES DRAWN AEROBIC AND ANAEROBIC Blood Culture adequate volume Performed at [facility], [address] [place], [place] [address]    Culture  Setup Time   Final    GRAM POSITIVE COCCI IN BOTH AEROBIC AND ANAEROBIC BOTTLES CRITICAL RESULT CALLED TO, READ BACK BY AND VERIFIED WITH: PHARMD T BAUMEISTER [phone] MLM    Culture (A)  Final    STAPHYLOCOCCUS SPECIES (COAGULASE NEGATIVE) THE SIGNIFICANCE OF ISOLATING THIS ORGANISM FROM A SINGLE SET OF BLOOD CULTURES WHEN MULTIPLE SETS ARE DRAWN IS UNCERTAIN. PLEASE NOTIFY THE MICROBIOLOGY DEPARTMENT WITHIN ONE WEEK IF SPECIATION AND SENSITIVITIES ARE REQUIRED. Performed at [facility], [address] [place], [place] [address]    Report Status 11/15/2018 FINAL  Final  Blood Culture ID Panel (Reflexed)     Status: Abnormal   Collection Time: 11/12/18  2:10 AM  Result Value Ref  Range Status   Enterococcus species NOT DETECTED NOT DETECTED Final   Listeria monocytogenes NOT DETECTED NOT DETECTED Final   Staphylococcus species DETECTED (A) NOT DETECTED Final    Comment: Methicillin (oxacillin) susceptible coagulase negative staphylococcus. Possible blood culture contaminant (unless isolated from more than one blood culture draw or clinical case suggests pathogenicity). No antibiotic treatment is indicated for blood  culture  contaminants. CRITICAL RESULT CALLED TO, READ BACK BY AND VERIFIED WITH: PHARMD T BAUMEISTER [id number] MLM    Staphylococcus aureus (BCID) NOT DETECTED NOT DETECTED Final   Methicillin resistance NOT DETECTED NOT DETECTED Final   Streptococcus species NOT DETECTED NOT DETECTED Final   Streptococcus agalactiae NOT DETECTED NOT DETECTED Final   Streptococcus pneumoniae NOT DETECTED NOT DETECTED Final   Streptococcus pyogenes NOT DETECTED NOT DETECTED Final   Acinetobacter baumannii NOT DETECTED NOT DETECTED Final   Enterobacteriaceae species NOT DETECTED NOT DETECTED Final   Enterobacter cloacae complex NOT DETECTED NOT DETECTED Final   Escherichia coli NOT DETECTED NOT DETECTED Final   Klebsiella oxytoca NOT DETECTED NOT DETECTED Final   Klebsiella pneumoniae NOT DETECTED NOT DETECTED Final   Proteus species NOT DETECTED NOT DETECTED Final   Serratia marcescens NOT DETECTED NOT DETECTED Final   Haemophilus influenzae NOT DETECTED NOT DETECTED Final   Neisseria meningitidis NOT DETECTED NOT DETECTED Final   Pseudomonas aeruginosa NOT DETECTED NOT DETECTED Final   Candida albicans NOT DETECTED NOT DETECTED Final   Candida glabrata NOT DETECTED NOT DETECTED Final   Candida krusei NOT DETECTED NOT DETECTED Final   Candida parapsilosis NOT DETECTED NOT DETECTED Final   Candida tropicalis NOT DETECTED NOT DETECTED Final    Comment: Performed at [facility], [address], [place], [place] [address]  SARS CORONAVIRUS 2 (TAT 6-24 HRS) Nasopharyngeal Nasopharyngeal Swab     Status: None   Collection Time: 11/12/18  5:47 AM   Specimen: Nasopharyngeal Swab  Result Value Ref Range Status   SARS Coronavirus 2 NEGATIVE NEGATIVE Final    Comment: (NOTE) SARS-CoV-2 target nucleic acids are NOT DETECTED. The SARS-CoV-2 RNA is generally detectable in upper and lower respiratory specimens during the acute phase of infection. Negative results do not preclude SARS-CoV-2 infection, do not rule  out co-infections with other pathogens, and should not be used as the sole basis for treatment or other patient management decisions. Negative results must be combined with clinical observations, patient history, and epidemiological information. The expected result is Negative. Fact Sheet for Patients: HairSlick.no Fact Sheet for Healthcare Providers: quierodirigir.com This test is not yet approved or cleared by the [place] FDA and  has been authorized for detection and/or diagnosis of SARS-CoV-2 by FDA under an Emergency Use Authorization (EUA). This EUA will remain  in effect (meaning this test can be used) for the duration of the COVID-19 declaration under Section 56 4(b)(1) of the Act, 21 U.S.C. section 360bbb-3(b)(1), unless the authorization is terminated or revoked sooner. Performed at [facility], [address], [place], [place] [address]   Culture, blood (routine x 2)     Status: None   Collection Time: 11/12/18  7:02 PM   Specimen: BLOOD LEFT HAND  Result Value Ref Range Status   Specimen Description BLOOD LEFT HAND  Final   Special Requests   Final    BOTTLES DRAWN AEROBIC ONLY Blood Culture adequate volume   Culture   Final    NO GROWTH 5 DAYS Performed at [facility], [address] [place], [place] [address]    Report Status 11/17/2018 FINAL  Final  Novel Coronavirus, NAA (Hosp order, Send-out to Ref Lab; TAT 18-24 hrs     Status: None   Collection Time: 11/16/18  4:51 PM   Specimen: Nasopharyngeal Swab; Respiratory  Result Value Ref Range Status   SARS-CoV-2, NAA NOT DETECTED NOT DETECTED Final    Comment: (NOTE) This nucleic acid amplification test was developed and its performance characteristics determined by [facility]. Nucleic acid amplification tests include PCR and TMA. This test has not been FDA cleared or approved. This test has been authorized by FDA under an  Emergency Use Authorization (EUA). This test is only authorized for the duration of time the declaration that circumstances exist justifying the authorization of the emergency use of in vitro diagnostic tests for detection of SARS-CoV-2 virus and/or diagnosis of COVID-19 infection under section 564(b)(1) of the Act, 21 U.S.C. 604VWU-9(W360bbb-3(b) (1), unless the authorization is terminated or revoked sooner. When diagnostic testing is negative, the possibility of a false negative result should be considered in the context of a patient's recent exposures and the presence of clinical signs and symptoms consistent with COVID-19. An individual without symptoms of COVID- 19 and who is not shedding SARS-CoV-2 vi rus would expect to have a negative (not detected) result in this assay. Performed At: [facility] [address] [place], [place] [id number] Jolene SchimkeNagendra Sanjai MD FA:[id number]:[phone]    Coronavirus Source NASOPHARYNGEAL  Final    Comment: Performed at [facility], [address] [place], [place] [address]  Culture, blood (routine x 2)     Status: Abnormal (Preliminary result)   Collection Time: 11/18/18  7:45 AM   Specimen: BLOOD LEFT ARM  Result Value Ref Range Status   Specimen Description BLOOD LEFT ARM  Final   Special Requests   Final    BOTTLES DRAWN AEROBIC ONLY Blood Culture results may not be optimal due to an inadequate volume of blood received in culture bottles   Culture  Setup Time   Final    AEROBIC BOTTLE ONLY GRAM POSITIVE COCCI CRITICAL VALUE NOTED.  VALUE IS CONSISTENT WITH PREVIOUSLY REPORTED AND CALLED VALUE.    Culture (A)  Final    STAPHYLOCOCCUS AUREUS SUSCEPTIBILITIES TO FOLLOW Performed at [facility], [address] [place], [place] [address]    Report Status PENDING  Incomplete  Culture, blood (routine x 2)     Status: Abnormal (Preliminary result)   Collection Time: 11/18/18  7:51 AM   Specimen: BLOOD LEFT HAND  Result Value Ref Range  Status   Specimen Description BLOOD LEFT HAND  Final   Special Requests   Final    BOTTLES DRAWN AEROBIC ONLY Blood Culture results may not be optimal due to an inadequate volume of blood received in culture bottles   Culture  Setup Time   Final    AEROBIC BOTTLE ONLY GRAM POSITIVE COCCI CRITICAL VALUE NOTED.  VALUE IS CONSISTENT WITH PREVIOUSLY REPORTED AND CALLED VALUE.    Culture (A)  Final    STAPHYLOCOCCUS AUREUS SUSCEPTIBILITIES TO FOLLOW Performed at [facility], [address] [place], [place] [address]    Report Status PENDING  Incomplete     Labs: Basic Metabolic Panel: Recent Labs  Lab 11/14/18 0304 11/15/18 0732 11/17/18 1243 11/17/18 1750 11/18/18 0531  NA 130* 130* 131* 131* 130*  K 3.7 3.7 4.1 4.9 4.0  CL 100 97* 99 94* 95*  CO2  22 22 24 27 23   GLUCOSE 107* 107* 136* 125* 133*  BUN 12 14 20 19 18   CREATININE 0.85 0.75 0.80 0.81 0.94  CALCIUM 8.7* 8.9 8.7* 9.4 8.8*   Liver Function Tests: Recent Labs  Lab 11/14/18 0304 11/17/18 1750 11/18/18 0531  AST 26 32 37  ALT 15 25 23   ALKPHOS 35* 50 44  BILITOT 0.5 0.8 0.3  PROT 5.7* 7.1 6.2*  ALBUMIN 2.9* 3.3* 2.7*   No results for input(s): LIPASE, AMYLASE in the last 168 hours. No results for input(s): AMMONIA in the last 168 hours. CBC: Recent Labs  Lab 11/12/18 1345 11/13/18 0009 11/17/18 1750 11/18/18 0531  WBC  --  8.4 13.0* 16.7*  NEUTROABS  --   --  10.4* 14.5*  HGB  --  12.5 13.5 12.1  HCT 34.5 35.2* 38.9 35.1*  MCV  --  99.7 101.3* 100.6*  PLT  --  231 295 238   Cardiac Enzymes: Recent Labs  Lab 11/18/18 0531  CKTOTAL 428*   BNP: BNP (last 3 results) Recent Labs    11/12/18 0210  BNP 65.8    ProBNP (last 3 results) No results for input(s): PROBNP in the last 8760 hours.  CBG: No results for input(s): GLUCAP in the last 168 hours.     Signed:  Kayleen Memos, MD [facility] 11/19/2018, 11:03 AM

## 2018-11-19 NOTE — Consult Note (Signed)
[facility]    Date of Admission:  11/12/2018          Reason for Consult: Auto-consult     Referring Provider: Dow Adolph, DO Primary Care Provider: Merlene Laughter, MD  Assessment: Autumn [name] is a 85 y.o. female with significant PMH ofHTN, hypothyroidism, LBBB,arthritis,andGERDwho was admitted for nausea/vomiting, orthostatic hypotension, and generalized weakness. Pt now having acute on chronic back pain with mental status changes and blood cultures from 10/5 positive for Staph aureus. She remains afebrile. Worsening leukocytosis with WBC 16.7<<13.0<<8.4.  CT head today significant for acute/subacute nonhemorrhagic stroke of the L superior cerebellar hemisphere extending into the L cerebellar peduncle. Echo from 9/30 without signs of endocarditis.  MRI of spine on 10/5 negative for signs of discitis/osteomyelitis/epidural abscess and significant for disc protrusion at T7-8 and lumbar stenosis at L3-4 due to disc bulge and facet arthrosis.   Pt without obvious source of bacteremia at this time. Concern for endocarditis and septic emboli with new stroke vs discitis vs transient bacteremia.    Plan: 1. Continue Vancomycin, dosing per pharm 2. Monitor renal function on vancomycin 3. Follow-up blood cultures for pending susceptibilities 4. Repeat TTE  5.   Ordered L upper extremity doppler       [place] Antimicrobial Management Team Staphylococcus aureus bacteremia   Staphylococcus aureus bacteremia (SAB) is associated with a high rate of complications and mortality.  Specific aspects of clinical management are critical to optimizing the outcome of patients with SAB.  Therefore, the [facility] Antimicrobial Management Team [facility]) has initiated an intervention aimed at improving the management of SAB at [facility].  To do so, Infectious Diseases physicians are providing an evidence-based consult for the management of all patients with  SAB.     Yes No Comments  Perform follow-up blood cultures (even if the patient is afebrile) to ensure clearance of bacteremia     Remove vascular catheter and obtain follow-up blood cultures after the removal of the catheter   N/A  Perform echocardiography to evaluate for endocarditis (transthoracic ECHO is 40-50% sensitive, TEE is > 90% sensitive)   Please keep in mind, that neither test can definitively EXCLUDE endocarditis, and that should clinical suspicion remain high for endocarditis the patient should then still be treated with an "endocarditis" duration of therapy = 6 weeks  Consult electrophysiologist to evaluate implanted cardiac device (pacemaker, ICD)     Ensure source control   Have all abscesses been drained effectively? Have deep seeded infections (septic joints or osteomyelitis) had appropriate surgical debridement?  Investigate for "metastatic" sites of infection   Does the patient have ANY symptom or physical exam finding that would suggest a deeper infection (back or neck pain that may be suggestive of vertebral osteomyelitis or epidural abscess, muscle pain that could be a symptom of pyomyositis)?  Keep in mind that for deep seeded infections MRI imaging with contrast is preferred rather than other often insensitive tests such as plain x-rays, especially early in a patient's presentation.  Change antibiotic therapy to  Begin vancomycin   Beta-lactam antibiotics are preferred for MSSA due to higher cure rates.   If on Vancomycin, goal trough should be 15 - 20 mcg/mL  Estimated duration of IV antibiotic therapy:   Pending work-up and susceptibility results    Consult case management for probably prolonged outpatient IV antibiotic therapy    Active Problems:   Nausea and vomiting   Bilateral  hydronephrosis   Elevated troponin   Lactic acidosis   Chronic neck and back pain   Essential hypertension   Tachypnea   Postural dizziness  with near syncope   Pain of upper abdomen   . aspirin  325 mg Oral Daily  . carvedilol  12.5 mg Oral BID  . enoxaparin (LOVENOX) injection  40 mg Subcutaneous Q24H  . feeding supplement (ENSURE ENLIVE)  237 mL Oral BID BM  . influenza vaccine adjuvanted  0.5 mL Intramuscular Tomorrow-1000  . levothyroxine  50 mcg Oral QAC breakfast  . lidocaine  1 patch Transdermal Q24H  . losartan  100 mg Oral Daily  . pantoprazole  40 mg Oral BID  . senna-docusate  2 tablet Oral BID  . sodium chloride flush  3 mL Intravenous Q12H  . vitamin B-12  1,000 mcg Oral Daily    HPI: Autumn [name] is a 85 y.o. female with significant PMH ofHTN, hypothyroidism, LBBB,arthritis,andGERDwho presented for nausea, vomiting, and lightheadedness. She felt dizzy and weak while brushing her teeth and became nauseated and vomited. In the ED, pt was orthostatic with vital sign changes after standing for 3 minutes.   During admission, pt has been evaluated by cardiology for elevated troponin and abnormal echo of EF 25% and declined further aggressive cardiac workup. She was also seen by GI for nausea and abdominal discomfort, daily ibuprofen use, and unintentional 5lb wt loss over 4-8 weeks and declined EGD.   Hospital course has been complicated by persistent generalized weakness and severe lower back pain despite pain medications. CT chest, abdomen, pelvis for suspected dissection were without any acute findings. Neurosurgery consulted for MRI finding of disc protrusion at T7-8 and lumbar stenosis at L3-4.   Blood cultures draw on 10/5 for new leukocytosis, elevated lactate, and tachycardia positive for Staph aureus in 2/2 bottles. ID called for consultation.  Review of Systems: Review of Systems  Constitutional: Positive for malaise/fatigue. Negative for chills and fever.  Respiratory: Negative for cough and shortness of breath.   Cardiovascular: Negative for chest pain.  Gastrointestinal: Positive for nausea and  vomiting. Negative for abdominal pain.  Genitourinary: Negative for dysuria.  Musculoskeletal: Positive for back pain. Negative for neck pain.  Neurological: Positive for dizziness and weakness. Negative for headaches.    Past Medical History:  Diagnosis Date  . Anxiety   . GERD (gastroesophageal reflux disease)   . Hypertension   . Hypothyroid   . LBBB (left bundle branch block)   . Osteopenia   . Peripheral neuropathy   . Venous insufficiency     Social History   Tobacco Use  . Smoking status: Never Smoker  . Smokeless tobacco: Never Used  Substance Use Topics  . Alcohol use: Yes    Frequency: Never    Comment: Once every three months   . Drug use: No    Family History  Problem Relation Age of Onset  . Hypertension Mother   . Prostate cancer Father    Allergies  Allergen Reactions  . Hydrochlorothiazide Other (See Comments)    REACTION: hyponatremia  . Ramipril Other (See Comments)    REACTION: GI upset  . Remeron [Mirtazapine] Other (See Comments)    REACTION: weakness  . Sulfa Antibiotics Rash    OBJECTIVE: Blood pressure (!) 146/68, pulse 92, temperature 99.1 F (37.3 C), temperature source Oral, resp. rate 16, height 5\' 3"  (1.6 m), weight 68 kg, SpO2 92 %.  Physical Exam Vitals signs and nursing note reviewed.  Constitutional:  General: She is not in acute distress.    Appearance: She is not ill-appearing.     Comments: Pt is oriented, though speaks softly in short sentences and does not engage in conversation for long.  HENT:     Head: Normocephalic and atraumatic.     Mouth/Throat:     Mouth: Mucous membranes are dry.  Neck:     Musculoskeletal: Normal range of motion and neck supple.  Cardiovascular:     Rate and Rhythm: Normal rate and regular rhythm.     Heart sounds: Normal heart sounds. No murmur. No friction rub. No gallop.   Pulmonary:     Effort: No respiratory distress.     Breath sounds: Normal breath sounds. No wheezing,  rhonchi or rales.  Abdominal:     General: Bowel sounds are normal. There is no distension.     Palpations: Abdomen is soft.     Tenderness: There is no abdominal tenderness.  Musculoskeletal:        General: Tenderness (lumbar spine) present. No deformity or signs of injury.  Skin:    General: Skin is warm and dry.     Comments: Site of erythema and bruising at L hand at previous IV site.  Neurological:     Mental Status: She is lethargic.     Comments: Oriented to person, place, and time. Minimally interactive, following commands. Moving all extremities spontaneously.  Psychiatric:        Mood and Affect: Mood normal.     Lab Results Lab Results  Component Value Date   WBC 16.7 (H) 11/18/2018   HGB 12.1 11/18/2018   HCT 35.1 (L) 11/18/2018   MCV 100.6 (H) 11/18/2018   PLT 238 11/18/2018    Lab Results  Component Value Date   CREATININE 0.94 11/18/2018   BUN 18 11/18/2018   NA 130 (L) 11/18/2018   K 4.0 11/18/2018   CL 95 (L) 11/18/2018   CO2 23 11/18/2018    Lab Results  Component Value Date   ALT 23 11/18/2018   AST 37 11/18/2018   ALKPHOS 44 11/18/2018   BILITOT 0.3 11/18/2018     Microbiology: Recent Results (from the past 240 hour(s))  Culture, blood (routine x 2)     Status: Abnormal   Collection Time: 11/12/18  2:10 AM   Specimen: BLOOD  Result Value Ref Range Status   Specimen Description   Final    BLOOD RIGHT ANTECUBITAL Performed at [facility], [address], [place], [place] [address]    Special Requests   Final    BOTTLES DRAWN AEROBIC AND ANAEROBIC Blood Culture adequate volume Performed at [facility], [address] [place], [place] [address]    Culture  Setup Time   Final    GRAM POSITIVE COCCI IN BOTH AEROBIC AND ANAEROBIC BOTTLES CRITICAL RESULT CALLED TO, READ BACK BY AND VERIFIED WITH: PHARMD T BAUMEISTER [id number] MLM    Culture (A)  Final    STAPHYLOCOCCUS SPECIES (COAGULASE NEGATIVE) THE SIGNIFICANCE  OF ISOLATING THIS ORGANISM FROM A SINGLE SET OF BLOOD CULTURES WHEN MULTIPLE SETS ARE DRAWN IS UNCERTAIN. PLEASE NOTIFY THE MICROBIOLOGY DEPARTMENT WITHIN ONE WEEK IF SPECIATION AND SENSITIVITIES ARE REQUIRED. Performed at [facility], [address] [place], [place] [address]    Report Status 11/15/2018 FINAL  Final  Blood Culture ID Panel (Reflexed)     Status: Abnormal   Collection Time: 11/12/18  2:10 AM  Result Value Ref Range Status  Enterococcus species NOT DETECTED NOT DETECTED Final   Listeria monocytogenes NOT DETECTED NOT DETECTED Final   Staphylococcus species DETECTED (A) NOT DETECTED Final    Comment: Methicillin (oxacillin) susceptible coagulase negative staphylococcus. Possible blood culture contaminant (unless isolated from more than one blood culture draw or clinical case suggests pathogenicity). No antibiotic treatment is indicated for blood  culture contaminants. CRITICAL RESULT CALLED TO, READ BACK BY AND VERIFIED WITH: PHARMD T BAUMEISTER [id number] MLM    Staphylococcus aureus (BCID) NOT DETECTED NOT DETECTED Final   Methicillin resistance NOT DETECTED NOT DETECTED Final   Streptococcus species NOT DETECTED NOT DETECTED Final   Streptococcus agalactiae NOT DETECTED NOT DETECTED Final   Streptococcus pneumoniae NOT DETECTED NOT DETECTED Final   Streptococcus pyogenes NOT DETECTED NOT DETECTED Final   Acinetobacter baumannii NOT DETECTED NOT DETECTED Final   Enterobacteriaceae species NOT DETECTED NOT DETECTED Final   Enterobacter cloacae complex NOT DETECTED NOT DETECTED Final   Escherichia coli NOT DETECTED NOT DETECTED Final   Klebsiella oxytoca NOT DETECTED NOT DETECTED Final   Klebsiella pneumoniae NOT DETECTED NOT DETECTED Final   Proteus species NOT DETECTED NOT DETECTED Final   Serratia marcescens NOT DETECTED NOT DETECTED Final   Haemophilus influenzae NOT DETECTED NOT DETECTED Final   Neisseria meningitidis NOT DETECTED NOT DETECTED Final    Pseudomonas aeruginosa NOT DETECTED NOT DETECTED Final   Candida albicans NOT DETECTED NOT DETECTED Final   Candida glabrata NOT DETECTED NOT DETECTED Final   Candida krusei NOT DETECTED NOT DETECTED Final   Candida parapsilosis NOT DETECTED NOT DETECTED Final   Candida tropicalis NOT DETECTED NOT DETECTED Final    Comment: Performed at [facility], [address] [place], [place] [address]  SARS CORONAVIRUS 2 (TAT 6-24 HRS) Nasopharyngeal Nasopharyngeal Swab     Status: None   Collection Time: 11/12/18  5:47 AM   Specimen: Nasopharyngeal Swab  Result Value Ref Range Status   SARS Coronavirus 2 NEGATIVE NEGATIVE Final    Comment: (NOTE) SARS-CoV-2 target nucleic acids are NOT DETECTED. The SARS-CoV-2 RNA is generally detectable in upper and lower respiratory specimens during the acute phase of infection. Negative results do not preclude SARS-CoV-2 infection, do not rule out co-infections with other pathogens, and should not be used as the sole basis for treatment or other patient management decisions. Negative results must be combined with clinical observations, patient history, and epidemiological information. The expected result is Negative. Fact Sheet for Patients: HairSlick.no Fact Sheet for Healthcare Providers: quierodirigir.com This test is not yet approved or cleared by the [place] FDA and  has been authorized for detection and/or diagnosis of SARS-CoV-2 by FDA under an Emergency Use Authorization (EUA). This EUA will remain  in effect (meaning this test can be used) for the duration of the COVID-19 declaration under Section 56 4(b)(1) of the Act, 21 U.S.C. section 360bbb-3(b)(1), unless the authorization is terminated or revoked sooner. Performed at [facility], [address], [place], [place] [address]   Culture, blood (routine x 2)     Status: None   Collection Time: 11/12/18  7:02 PM    Specimen: BLOOD LEFT HAND  Result Value Ref Range Status   Specimen Description BLOOD LEFT HAND  Final   Special Requests   Final    BOTTLES DRAWN AEROBIC ONLY Blood Culture adequate volume   Culture   Final    NO GROWTH 5 DAYS Performed at [facility], [address], [place],  [place] [address]    Report Status 11/17/2018 FINAL  Final  Novel Coronavirus, NAA (Hosp order, Send-out to Ref Lab; TAT 18-24 hrs     Status: None   Collection Time: 11/16/18  4:51 PM   Specimen: Nasopharyngeal Swab; Respiratory  Result Value Ref Range Status   SARS-CoV-2, NAA NOT DETECTED NOT DETECTED Final    Comment: (NOTE) This nucleic acid amplification test was developed and its performance characteristics determined by [facility]. Nucleic acid amplification tests include PCR and TMA. This test has not been FDA cleared or approved. This test has been authorized by FDA under an Emergency Use Authorization (EUA). This test is only authorized for the duration of time the declaration that circumstances exist justifying the authorization of the emergency use of in vitro diagnostic tests for detection of SARS-CoV-2 virus and/or diagnosis of COVID-19 infection under section 564(b)(1) of the Act, 21 U.S.C. 604VWU-9(W360bbb-3(b) (1), unless the authorization is terminated or revoked sooner. When diagnostic testing is negative, the possibility of a false negative result should be considered in the context of a patient's recent exposures and the presence of clinical signs and symptoms consistent with COVID-19. An individual without symptoms of COVID- 19 and who is not shedding SARS-CoV-2 vi rus would expect to have a negative (not detected) result in this assay. Performed At: [facility] [address] [place], [place] [id number] Jolene SchimkeNagendra Sanjai MD FA:[id number]:[phone]    Coronavirus Source NASOPHARYNGEAL  Final    Comment: Performed at [facility], [address] [place], [place] [address]   Culture, blood (routine x 2)     Status: Abnormal (Preliminary result)   Collection Time: 11/18/18  7:45 AM   Specimen: BLOOD LEFT ARM  Result Value Ref Range Status   Specimen Description BLOOD LEFT ARM  Final   Special Requests   Final    BOTTLES DRAWN AEROBIC ONLY Blood Culture results may not be optimal due to an inadequate volume of blood received in culture bottles   Culture  Setup Time   Final    AEROBIC BOTTLE ONLY GRAM POSITIVE COCCI CRITICAL VALUE NOTED.  VALUE IS CONSISTENT WITH PREVIOUSLY REPORTED AND CALLED VALUE.    Culture (A)  Final    STAPHYLOCOCCUS AUREUS SUSCEPTIBILITIES TO FOLLOW Performed at [facility], [address] [place], [place] [address]    Report Status PENDING  Incomplete  Culture, blood (routine x 2)     Status: Abnormal (Preliminary result)   Collection Time: 11/18/18  7:51 AM   Specimen: BLOOD LEFT HAND  Result Value Ref Range Status   Specimen Description BLOOD LEFT HAND  Final   Special Requests   Final    BOTTLES DRAWN AEROBIC ONLY Blood Culture results may not be optimal due to an inadequate volume of blood received in culture bottles   Culture  Setup Time   Final    AEROBIC BOTTLE ONLY GRAM POSITIVE COCCI CRITICAL VALUE NOTED.  VALUE IS CONSISTENT WITH PREVIOUSLY REPORTED AND CALLED VALUE.    Culture (A)  Final    STAPHYLOCOCCUS AUREUS SUSCEPTIBILITIES TO FOLLOW Performed at [facility], [address] [place], [place] [address]    Report Status PENDING  Incomplete  MRSA PCR Screening     Status: None   Collection Time: 11/19/18 10:48 AM   Specimen: Nasal Mucosa; Nasopharyngeal  Result Value Ref Range Status   MRSA by PCR NEGATIVE NEGATIVE Final    Comment:        The GeneXpert  MRSA Assay (FDA approved for NASAL specimens only), is one component of a comprehensive MRSA colonization surveillance program. It is not intended to diagnose MRSA infection nor to guide or monitor treatment for MRSA infections. Performed  at [facility], [place] [address], [place], [place] [address]     Ladona Horns, MD Internal Medicine Resident Intern Pager: [phone]  11/19/2018 1:53 PM

## 2018-11-19 NOTE — Progress Notes (Signed)
    [facility] has been requested to perform a transesophageal echocardiogram on Autumn [name] for bacteremia.  After careful review of history and examination, the risks and benefits of transesophageal echocardiogram have been explained including risks of esophageal damage, perforation (1:10,000 risk), bleeding, pharyngeal hematoma as well as other potential complications associated with conscious sedation including aspiration, arrhythmia, respiratory failure and death. Alternatives to treatment were discussed, questions were answered. Patient is willing to proceed.   Pt is scheduled for 2pm tomorrow with Dr. Meda Coffee. NPO at [place].   Tami Lin Mccauley Diehl, [place]  11/19/2018 4:15 PM

## 2018-11-19 NOTE — Plan of Care (Signed)
?  Problem: Education: ?Goal: Knowledge of General Education information will improve ?Description: Including pain rating scale, medication(s)/side effects and non-pharmacologic comfort measures ?Outcome: Progressing ?  ?Problem: Clinical Measurements: ?Goal: Will remain free from infection ?Outcome: Progressing ?  ?Problem: Activity: ?Goal: Risk for activity intolerance will decrease ?Outcome: Progressing ?  ?Problem: Coping: ?Goal: Level of anxiety will decrease ?Outcome: Progressing ?  ?Problem: Safety: ?Goal: Ability to remain free from injury will improve ?Outcome: Progressing ?  ?

## 2018-11-19 NOTE — Progress Notes (Signed)
Pharmacy Antibiotic Note  Autumn [name] is a 85 y.o. female admitted on 11/12/2018 with nausea and vomiting. Now with staph aureus bacteremia in blood cultures from  10/5.  Noted patient had a peripheral line in from 9/29 to 10/4 and has had a new peripheral line in place since 10/4. Pharmacy has been consulted for vancomycin dosing.  Plan: Vancomycin 1750 mg once  Then Vancomycin 750 mg every 24 hours Predicted AUC 462 With SCr-1 Monitor renal function, blood cultures   Height: 5\' 3"  (160 cm) Weight: 149 lb 14.6 oz (68 kg) IBW/kg (Calculated) : 52.4  Temp (24hrs), Avg:99.2 F (37.3 C), Min:99.1 F (37.3 C), Max:99.2 F (37.3 C)  Recent Labs  Lab 11/13/18 0009 11/13/18 0811 11/14/18 0304 11/15/18 0732 11/17/18 1243 11/17/18 1750 11/17/18 2057 11/18/18 0531 11/18/18 0751  WBC 8.4  --   --   --   --  13.0*  --  16.7*  --   CREATININE 0.77 0.70 0.85 0.75 0.80 0.81  --  0.94  --   LATICACIDVEN  --  1.7  --   --   --  2.1* 3.3* 1.9 2.4*    Estimated Creatinine Clearance: 41.2 mL/min (by C-G formula based on SCr of 0.94 mg/dL).    Allergies  Allergen Reactions  . Hydrochlorothiazide Other (See Comments)    REACTION: hyponatremia  . Ramipril Other (See Comments)    REACTION: GI upset  . Remeron [Mirtazapine] Other (See Comments)    REACTION: weakness  . Sulfa Antibiotics Rash    Antimicrobials this admission: 10/5 Vanc>>  Microbiology results: 10/5 BCx: Staph aureus in 2/2   Thank you for allowing pharmacy to be a part of this patient's care.  Jimmy Footman, PharmD, BCPS, [place] Infectious Diseases Clinical Pharmacist Phone: [phone] 11/19/2018 10:47 AM

## 2018-11-19 NOTE — Progress Notes (Signed)
LUE venous duplex       has been completed. Preliminary results can be found under CV proc through chart review. Robbie Nangle, BS, RDMS, RVT   

## 2018-11-19 NOTE — Progress Notes (Signed)
Physical Therapy Treatment Patient Details Name: Autumn [name] MRN: [id number] DOB: 11/11/1932 Today's Date: 11/19/2018    History of Present Illness 85 y.o. female with past medical history significant for HTN, LBBB, and GERD who presents to the ED via EMS from [facility] center for acute onset neck and back pain as well as vomiting. In ED became orthostatic with 3 minutes of standing and found to have elevated troponing levels. Admitted for observation 9/29.    PT Comments    Asked by Dr. Vertell Limber to help him get her up again so he can see how much pain she is in and where it is as she didn't complain of back pain to him but L point tenderness at anterior shoulder. Attempted to stand and ambulation however pt in 10/10 R SI pain. Dr. Vertell Limber present and saw patients functional limitations and both cognitive and functional impairment since initial evaluation and admission. Acute PT to cont to follow.    Follow Up Recommendations  SNF;Supervision for mobility/OOB     Equipment Recommendations  Rolling walker with 5" wheels    Recommendations for Other Services       Precautions / Restrictions Precautions Precautions: Fall Precaution Comments: severe back pain Restrictions Weight Bearing Restrictions: No    Mobility  Bed Mobility Overal bed mobility: Needs Assistance Bed Mobility: Rolling;Sidelying to Sit;Sit to Sidelying Rolling: Max assist Sidelying to sit: Max assist     Sit to sidelying: Max assist General bed mobility comments: pt with minimal assist, very guarded due to severe onset of back pain with any movement, maxA for trunk elevation and LE management back into bed  Transfers Overall transfer level: Needs assistance Equipment used: 2 person hand held assist Transfers: Sit to/from Stand Sit to Stand: Max assist;+2 physical assistance         General transfer comment: unable to achieve full upright standing, with attempt of back extension pt in  excrutiating pain over R SI area, crying in pain. Dr. Vertell Limber present  Ambulation/Gait             General Gait Details: unable this date due to 10/10 back pain   Stairs             Wheelchair Mobility    Modified Rankin (Stroke Patients Only)       Balance Overall balance assessment: Needs assistance Sitting-balance support: Feet supported;No upper extremity supported Sitting balance-Leahy Scale: Fair     Standing balance support: Bilateral upper extremity supported Standing balance-Leahy Scale: Poor Standing balance comment: dependent on physical assist                            Cognition Arousal/Alertness: Awake/alert Behavior During Therapy: Flat affect;WFL for tasks assessed/performed Overall Cognitive Status: Impaired/Different from baseline Area of Impairment: Problem solving;Following commands;Safety/judgement                       Following Commands: Follows one step commands with increased time;Follows one step commands inconsistently Safety/Judgement: Decreased awareness of safety   Problem Solving: Slow processing;Requires verbal cues;Requires tactile cues General Comments: pt with altered mentation compared to baseline      Exercises      General Comments General comments (skin integrity, edema, etc.): VSS however AMS and lethargy      Pertinent Vitals/Pain Pain Assessment: 0-10 Pain Score: 10-Worst pain ever Pain Location: low back and point tenderness over L shoulder Pain Descriptors /  Indicators: Pressure;Constant Pain Intervention(s): Limited activity within patient's tolerance    Home Living                      Prior Function            PT Goals (current goals can now be found in the care plan section) Progress towards PT goals: Not progressing toward goals - comment(due to pain)    Frequency    Min 3X/week      PT Plan Current plan remains appropriate    Co-evaluation               AM-PAC PT "6 Clicks" Mobility   Outcome Measure  Help needed turning from your back to your side while in a flat bed without using bedrails?: A Lot Help needed moving from lying on your back to sitting on the side of a flat bed without using bedrails?: A Lot Help needed moving to and from a bed to a chair (including a wheelchair)?: A Lot Help needed standing up from a chair using your arms (e.g., wheelchair or bedside chair)?: A Lot Help needed to walk in hospital room?: Total Help needed climbing 3-5 steps with a railing? : Total 6 Click Score: 10    End of Session Equipment Utilized During Treatment: Gait belt Activity Tolerance: Patient limited by pain Patient left: in bed;with call bell/phone within reach;with bed alarm set Nurse Communication: Mobility status PT Visit Diagnosis: Muscle weakness (generalized) (M62.81);Difficulty in walking, not elsewhere classified (R26.2);Other abnormalities of gait and mobility (R26.89)     Time: 8756-4332 PT Time Calculation (min) (ACUTE ONLY): 12 min  Charges:  $Therapeutic Activity: 8-22 mins                     Lewis Shock, PT, DPT Acute Rehabilitation Services Pager #: [phone] Office #: [phone]    Iona Hansen 11/19/2018, 2:55 PM

## 2018-11-19 NOTE — Progress Notes (Addendum)
Subjective: Patient reports "It hurts.. just my low back"  Objective: Vital signs in last 24 hours: Temp:  [99.1 F (37.3 C)-99.2 F (37.3 C)] 99.1 F (37.3 C) (10/06 0345) Pulse Rate:  [92-96] 92 (10/06 0345) Resp:  [16-20] 16 (10/06 0345) BP: (116-146)/(54-71) 146/68 (10/06 0345) SpO2:  [92 %] 92 % (10/06 0345) Weight:  [68 kg] 68 kg (10/06 0345)  Intake/Output from previous day: 10/05 0701 - 10/06 0700 In: 237 [P.O.:237] Out: 400 [Urine:400] Intake/Output this shift: No intake/output data recorded.  Awake, responds to questions but offers no conversation this morning. She reports lumbar pain, severe with movement and limiting exam. She cries out in pain with gentle touch of left shoulder. (No swelling or redness). She states left 5th digit and wrist hurts, but cannot offer duration. Good strength RUE and BLE. Nursing reports Norco has been effective controlling lumbar pain reports.    Lab Results: Recent Labs    11/17/18 1750 11/18/18 0531  WBC 13.0* 16.7*  HGB 13.5 12.1  HCT 38.9 35.1*  PLT 295 238   BMET Recent Labs    11/17/18 1750 11/18/18 0531  NA 131* 130*  K 4.9 4.0  CL 94* 95*  CO2 27 23  GLUCOSE 125* 133*  BUN 19 18  CREATININE 0.81 0.94  CALCIUM 9.4 8.8*    Studies/Results: Ct Abdomen Pelvis Wo Contrast  Result Date: 11/17/2018 CLINICAL DATA:  85 year old female with history of chest and back pain. Suspected acute aortic dissection. EXAM: CT CHEST, ABDOMEN AND PELVIS WITHOUT CONTRAST TECHNIQUE: Multidetector CT imaging of the chest, abdomen and pelvis was performed following the standard protocol without IV contrast. COMPARISON:  Chest CTA 01/31/2017. CT the abdomen and pelvis 09/09/2008. FINDINGS: CT CHEST FINDINGS Cardiovascular: Heart size is normal. There is no significant pericardial fluid, thickening or pericardial calcification. There is aortic atherosclerosis, as well as atherosclerosis of the great vessels of the mediastinum and the coronary  arteries, including calcified atherosclerotic plaque in the right coronary artery. No crescentic high attenuation associated with the wall of the thoracic aorta to suggest the presence of acute intramural hemorrhage. Accurate assessment for aortic dissection is not possible on today's noncontrast examination. Mediastinum/Nodes: No pathologically enlarged mediastinal or hilar lymph nodes. Please note that accurate exclusion of hilar adenopathy is limited on noncontrast CT scans. Esophagus is unremarkable in appearance. No axillary lymphadenopathy. Lungs/Pleura: No acute consolidative airspace disease. No pleural effusions. Multiple linear areas of architectural distortion throughout the lungs bilaterally, favored to reflect areas of chronic post infectious or inflammatory scarring. No suspicious appearing pulmonary nodules or masses are noted. Musculoskeletal: There are no aggressive appearing lytic or blastic lesions noted in the visualized portions of the skeleton. CT ABDOMEN PELVIS FINDINGS Hepatobiliary: No definite suspicious cystic or solid hepatic lesions are confidently identified on today's noncontrast CT examination. Unenhanced appearance of the gallbladder is normal. Pancreas: No definite pancreatic mass or peripancreatic fluid collections or inflammatory changes are noted on today's noncontrast examination. Spleen: Unremarkable. Adrenals/Urinary Tract: 2.6 cm exophytic low-attenuation lesion in upper pole of the left kidney, incompletely characterized on today's non-contrast CT examination, but statistically likely to represent a cyst. Unenhanced appearance of the right kidney and bilateral adrenal glands is normal. No hydroureteronephrosis. Urinary bladder is unremarkable in appearance. Stomach/Bowel: Unenhanced appearance of the stomach is normal. No pathologic dilatation of small bowel or colon. The appendix is not confidently identified and may be surgically absent. Regardless, there are no  inflammatory changes noted adjacent to the cecum to suggest the presence  of an acute appendicitis at this time. Vascular/Lymphatic: Aortic atherosclerosis. No crescentic high attenuation associated with the wall of the abdominal aorta to suggest acute intramural hematoma. No lymphadenopathy noted in the abdomen or pelvis. Reproductive: Status post hysterectomy. Ovaries are not confidently identified and may be surgically absent or atrophic. Other: No significant volume of ascites.  No pneumoperitoneum. Musculoskeletal: There are no aggressive appearing lytic or blastic lesions noted in the visualized portions of the skeleton. IMPRESSION: 1. No definitive imaging findings on today's noncontrast CT examination to suggest acute aortic syndrome. 2. No acute findings are noted in the chest, abdomen or pelvis to account for the patient's symptoms. 3. Aortic atherosclerosis, in addition to right coronary artery disease. 4. Additional incidental findings, as above. Electronically Signed   By: Trudie Reed M.D.   On: 11/17/2018 17:42   Ct Chest Wo Contrast  Result Date: 11/17/2018 CLINICAL DATA:  85 year old female with history of chest and back pain. Suspected acute aortic dissection. EXAM: CT CHEST, ABDOMEN AND PELVIS WITHOUT CONTRAST TECHNIQUE: Multidetector CT imaging of the chest, abdomen and pelvis was performed following the standard protocol without IV contrast. COMPARISON:  Chest CTA 01/31/2017. CT the abdomen and pelvis 09/09/2008. FINDINGS: CT CHEST FINDINGS Cardiovascular: Heart size is normal. There is no significant pericardial fluid, thickening or pericardial calcification. There is aortic atherosclerosis, as well as atherosclerosis of the great vessels of the mediastinum and the coronary arteries, including calcified atherosclerotic plaque in the right coronary artery. No crescentic high attenuation associated with the wall of the thoracic aorta to suggest the presence of acute intramural hemorrhage.  Accurate assessment for aortic dissection is not possible on today's noncontrast examination. Mediastinum/Nodes: No pathologically enlarged mediastinal or hilar lymph nodes. Please note that accurate exclusion of hilar adenopathy is limited on noncontrast CT scans. Esophagus is unremarkable in appearance. No axillary lymphadenopathy. Lungs/Pleura: No acute consolidative airspace disease. No pleural effusions. Multiple linear areas of architectural distortion throughout the lungs bilaterally, favored to reflect areas of chronic post infectious or inflammatory scarring. No suspicious appearing pulmonary nodules or masses are noted. Musculoskeletal: There are no aggressive appearing lytic or blastic lesions noted in the visualized portions of the skeleton. CT ABDOMEN PELVIS FINDINGS Hepatobiliary: No definite suspicious cystic or solid hepatic lesions are confidently identified on today's noncontrast CT examination. Unenhanced appearance of the gallbladder is normal. Pancreas: No definite pancreatic mass or peripancreatic fluid collections or inflammatory changes are noted on today's noncontrast examination. Spleen: Unremarkable. Adrenals/Urinary Tract: 2.6 cm exophytic low-attenuation lesion in upper pole of the left kidney, incompletely characterized on today's non-contrast CT examination, but statistically likely to represent a cyst. Unenhanced appearance of the right kidney and bilateral adrenal glands is normal. No hydroureteronephrosis. Urinary bladder is unremarkable in appearance. Stomach/Bowel: Unenhanced appearance of the stomach is normal. No pathologic dilatation of small bowel or colon. The appendix is not confidently identified and may be surgically absent. Regardless, there are no inflammatory changes noted adjacent to the cecum to suggest the presence of an acute appendicitis at this time. Vascular/Lymphatic: Aortic atherosclerosis. No crescentic high attenuation associated with the wall of the  abdominal aorta to suggest acute intramural hematoma. No lymphadenopathy noted in the abdomen or pelvis. Reproductive: Status post hysterectomy. Ovaries are not confidently identified and may be surgically absent or atrophic. Other: No significant volume of ascites.  No pneumoperitoneum. Musculoskeletal: There are no aggressive appearing lytic or blastic lesions noted in the visualized portions of the skeleton. IMPRESSION: 1. No definitive imaging findings on today's noncontrast  CT examination to suggest acute aortic syndrome. 2. No acute findings are noted in the chest, abdomen or pelvis to account for the patient's symptoms. 3. Aortic atherosclerosis, in addition to right coronary artery disease. 4. Additional incidental findings, as above. Electronically Signed   By: Trudie Reed M.D.   On: 11/17/2018 17:42   Mr Thoracic Spine W Wo Contrast  Result Date: 11/18/2018 CLINICAL DATA:  Back pain EXAM: MRI THORACIC WITHOUT AND WITH CONTRAST TECHNIQUE: Multiplanar and multiecho pulse sequences of the thoracic spine were obtained without and with intravenous contrast. CONTRAST:  74mL GADAVIST GADOBUTROL 1 MMOL/ML IV SOLN COMPARISON:  None. FINDINGS: MRI THORACIC SPINE FINDINGS Alignment:  Physiologic. Vertebrae: No fracture, evidence of discitis, or bone lesion. Cord:  Normal signal and morphology. Paraspinal and other soft tissues: Small pleural effusions Disc levels: At T7-8, there is an intermediate sized central disc protrusion that effaces the ventral thecal sac and contacts the anterior surface of the spinal cord. There is no spinal canal stenosis. At T8-9, there is a small right subarticular disc protrusion without stenosis. At T9-10, there is a small disc osteophyte complex, greatest in the right subarticular zone, without spinal canal stenosis. At T11-12, there is a left subarticular disc protrusion without causing stenosis. IMPRESSION: 1. No discitis-osteomyelitis or epidural abscess. 2. Midthoracic  degenerative disc disease without spinal canal stenosis. 3. Central disc protrusion at T7-8 contacting the ventral surface of the spinal cord. Electronically Signed   By: Deatra Robinson M.D.   On: 11/18/2018 21:51   Mr Lumbar Spine W Wo Contrast  Result Date: 11/18/2018 CLINICAL DATA:  Back pain. Nausea and vomiting. EXAM: MRI LUMBAR SPINE WITHOUT AND WITH CONTRAST TECHNIQUE: Multiplanar and multiecho pulse sequences of the lumbar spine were obtained without and with intravenous contrast. CONTRAST:  76mL GADAVIST GADOBUTROL 1 MMOL/ML IV SOLN COMPARISON:  None. FINDINGS: Segmentation:  Normal Alignment:  Dextroscoliosis with apex at L3 Vertebrae: No acute compression fracture, discitis-osteomyelitis, facet edema or other focal marrow lesion. No epidural collection. Conus medullaris and cauda equina: Conus extends to the L2 level. Conus and cauda equina appear normal. Paraspinal and other soft tissues: Negative. Disc levels: T12-L1: Normal disc space and facets. No spinal canal or neuroforaminal stenosis. L1-L2: Disc space narrowing with endplate spurring. No spinal canal stenosis. Severe right neural foraminal stenosis. L2-L3: Disc space narrowing with endplate spurring. Moderate bilateral neural foraminal stenosis. Mild facet hypertrophy. No central spinal canal stenosis. L3-L4: Disc space narrowing with diffuse bulge. Moderate facet hypertrophy. Mild spinal canal stenosis with severe narrowing of the left lateral recess. Mild right and severe left neural foraminal stenosis. L4-L5: Small disc bulge with mild facet hypertrophy. No spinal canal stenosis. Mild right and severe left neural foraminal stenosis. L5-S1: Small disc bulge with severe right facet hypertrophy. There is severe right neural foraminal stenosis and narrowing of the right lateral recess. No central spinal canal or left neural foraminal stenosis. Visualized sacrum: Normal. IMPRESSION: 1. No epidural abscess or discitis-osteomyelitis. 2. Severe left  lateral recess stenosis at L3-L4 due to combination of disc bulge and facet arthrosis. 3. Multilevel moderate-to-severe neural foraminal stenosis. Electronically Signed   By: Deatra Robinson M.D.   On: 11/18/2018 21:44   Mr Sacrum Si Joints W Wo Contrast  Result Date: 11/18/2018 CLINICAL DATA:  Back pain.  Possible infection. EXAM: MRI PELVIS WITHOUT AND WITH CONTRAST TECHNIQUE: Multiplanar multisequence MR imaging of the pelvis was performed both before and after administration of intravenous contrast. CONTRAST:  54mL GADAVIST GADOBUTROL 1 MMOL/ML  IV SOLN COMPARISON:  CT scan from yesterday. FINDINGS: The bony structures are intact. No stress fracture or bone lesion. No findings suggests sacroiliitis or septic arthritis. Mild inflammatory changes involving the right piriformis muscle and also the upper aspect of the right iliopsoas muscle. This could reflect nonspecific myositis. Both hips are normally located. Small joint effusions but no findings suspicious for septic arthritis. No evidence of avascular necrosis. No significant intrapelvic abnormalities are identified. IMPRESSION: 1. No findings suspicious for septic arthritis or osteomyelitis involving the sacrum or SI joints or hip joints. 2. Areas of nonspecific myositis mainly involving the right piriformis muscle and the right iliopsoas muscle. 3. No significant intrapelvic abnormalities. Electronically Signed   By: Rudie MeyerP.  Gallerani M.D.   On: 11/18/2018 21:36    Assessment/Plan:   LOS: 6 days  Continue to mobilize as tolerated. Dr. Venetia MaxonStern will visit this am.   Georgiann Cockeroteat, Brian 11/19/2018, 7:49 AM   Patient painful in low back on right side over SI joint region.

## 2018-11-19 NOTE — Consult Note (Signed)
Reason for Consult:back pain Referring Physician: [name] is an 85 y.o. female.  HPI: Patient is 85 year old woman well known to me, who I have been caring for, non-operatively, for low back pain and scoliosis, which is a chronic condition for her.  She was admitted with nausea and vomiting last week and has undergone extensive workup.  Recent lumbar MRI re-demonstrates chronic changes with scoliosis and spinal stenosis.  THere does not appear to be evidence of a new acute process.    Past Medical History:  Diagnosis Date   Anxiety    GERD (gastroesophageal reflux disease)    Hypertension    Hypothyroid    LBBB (left bundle branch block)    Osteopenia    Peripheral neuropathy    Venous insufficiency     Past Surgical History:  Procedure Laterality Date   ABDOMINAL HYSTERECTOMY     BLADDER SURGERY     LEFT HEART CATHETERIZATION WITH CORONARY ANGIOGRAM N/A 09/02/2013   Procedure: LEFT HEART CATHETERIZATION WITH CORONARY ANGIOGRAM;  Surgeon: [name], MD;  Location: [facility] CATH LAB;  Service: Cardiovascular;  Laterality: N/A;   RENAL ANGIOGRAM N/A 09/02/2013   Procedure: RENAL ANGIOGRAM;  Surgeon: [name], MD;  Location: [facility] CATH LAB;  Service: Cardiovascular;  Laterality: N/A;    Family History  Problem Relation Age of Onset   Hypertension Mother    Prostate cancer Father     Social History:  reports that she has never smoked. She has never used smokeless tobacco. She reports current alcohol use. She reports that she does not use drugs.  Allergies:  Allergies  Allergen Reactions   Hydrochlorothiazide Other (See Comments)    REACTION: hyponatremia   Ramipril Other (See Comments)    REACTION: GI upset   Remeron [Mirtazapine] Other (See Comments)    REACTION: weakness   Sulfa Antibiotics Rash    Medications: I have reviewed the patient's current medications.  Results for orders placed or performed during the hospital encounter of  11/12/18 (from the past 48 hour(s))  Basic metabolic panel     Status: Abnormal   Collection Time: 11/17/18 12:43 PM  Result Value Ref Range   Sodium 131 (L) 135 - 145 mmol/L   Potassium 4.1 3.5 - 5.1 mmol/L   Chloride 99 98 - 111 mmol/L   CO2 24 22 - 32 mmol/L   Glucose, Bld 136 (H) 70 - 99 mg/dL   BUN 20 8 - 23 mg/dL   Creatinine, Ser 1.61 0.44 - 1.00 mg/dL   Calcium 8.7 (L) 8.9 - 10.3 mg/dL   GFR calc non Af Amer >60 >60 mL/min   GFR calc Af Amer >60 >60 mL/min   Anion gap 8 5 - 15    Comment: Performed at [facility], [address], [place], [place] [address]  Blood gas, arterial     Status: Abnormal   Collection Time: 11/17/18  5:50 PM  Result Value Ref Range   O2 Content 6.0 L/min   Delivery systems NASAL CANNULA    pH, Arterial 7.484 (H) 7.350 - 7.450   pCO2 arterial 33.2 32.0 - 48.0 mmHg   pO2, Arterial 72.4 (L) 83.0 - 108.0 mmHg   Bicarbonate 24.9 20.0 - 28.0 mmol/L   Acid-Base Excess 1.6 0.0 - 2.0 mmol/L   O2 Saturation 95.6 %   Patient temperature 97.7    Collection site RIGHT RADIAL    Drawn by [id number]    Sample type ARTERIAL DRAW  Allens test (pass/fail) PASS PASS  CBC with Differential/Platelet     Status: Abnormal   Collection Time: 11/17/18  5:50 PM  Result Value Ref Range   WBC 13.0 (H) 4.0 - 10.5 K/uL   RBC 3.84 (L) 3.87 - 5.11 MIL/uL   Hemoglobin 13.5 12.0 - 15.0 g/dL   HCT 16.138.9 09.636.0 - 04.546.0 %   MCV 101.3 (H) 80.0 - 100.0 fL   MCH 35.2 (H) 26.0 - 34.0 pg   MCHC 34.7 30.0 - 36.0 g/dL   RDW 40.912.4 81.111.5 - 91.415.5 %   Platelets 295 150 - 400 K/uL   nRBC 0.0 0.0 - 0.2 %   Neutrophils Relative % 80 %   Neutro Abs 10.4 (H) 1.7 - 7.7 K/uL   Lymphocytes Relative 11 %   Lymphs Abs 1.5 0.7 - 4.0 K/uL   Monocytes Relative 7 %   Monocytes Absolute 0.9 0.1 - 1.0 K/uL   Eosinophils Relative 1 %   Eosinophils Absolute 0.1 0.0 - 0.5 K/uL   Basophils Relative 0 %   Basophils Absolute 0.1 0.0 - 0.1 K/uL   Immature Granulocytes 1 %   Abs Immature Granulocytes  0.10 (H) 0.00 - 0.07 K/uL    Comment: Performed at [facility], [address] [place], [place] [address]  Comprehensive metabolic panel     Status: Abnormal   Collection Time: 11/17/18  5:50 PM  Result Value Ref Range   Sodium 131 (L) 135 - 145 mmol/L   Potassium 4.9 3.5 - 5.1 mmol/L   Chloride 94 (L) 98 - 111 mmol/L   CO2 27 22 - 32 mmol/L   Glucose, Bld 125 (H) 70 - 99 mg/dL   BUN 19 8 - 23 mg/dL   Creatinine, Ser 6.210.81 0.44 - 1.00 mg/dL   Calcium 9.4 8.9 - 30.810.3 mg/dL   Total Protein 7.1 6.5 - 8.1 g/dL   Albumin 3.3 (L) 3.5 - 5.0 g/dL   AST 32 15 - 41 U/L   ALT 25 0 - 44 U/L   Alkaline Phosphatase 50 38 - 126 U/L   Total Bilirubin 0.8 0.3 - 1.2 mg/dL   GFR calc non Af Amer >60 >60 mL/min   GFR calc Af Amer >60 >60 mL/min   Anion gap 10 5 - 15    Comment: Performed at [facility], [address] [place], [place] [address]  Lactic acid, plasma     Status: Abnormal   Collection Time: 11/17/18  5:50 PM  Result Value Ref Range   Lactic Acid, Venous 2.1 (HH) 0.5 - 1.9 mmol/L    Comment: CRITICAL RESULT CALLED TO, READ BACK BY AND VERIFIED WITH: DAVIS, C RN @ 1844 ON 11/17/2018 BY TEMOCHE, H Performed at [facility], [address] [place], [place] [address]   Procalcitonin - Baseline     Status: None   Collection Time: 11/17/18  5:50 PM  Result Value Ref Range   Procalcitonin <0.10 ng/mL    Comment:        Interpretation: PCT (Procalcitonin) <= 0.5 ng/mL: Systemic infection (sepsis) is not likely. Local bacterial infection is possible. (NOTE)       Sepsis PCT Algorithm           Lower Respiratory Tract  Infection PCT Algorithm    ----------------------------     ----------------------------         PCT < 0.25 ng/mL                PCT < 0.10 ng/mL         Strongly encourage             Strongly discourage   discontinuation of antibiotics    initiation of antibiotics    ----------------------------      -----------------------------       PCT 0.25 - 0.50 ng/mL            PCT 0.10 - 0.25 ng/mL               OR       >80% decrease in PCT            Discourage initiation of                                            antibiotics      Encourage discontinuation           of antibiotics    ----------------------------     -----------------------------         PCT >= 0.50 ng/mL              PCT 0.26 - 0.50 ng/mL               AND        <80% decrease in PCT             Encourage initiation of                                             antibiotics       Encourage continuation           of antibiotics    ----------------------------     -----------------------------        PCT >= 0.50 ng/mL                  PCT > 0.50 ng/mL               AND         increase in PCT                  Strongly encourage                                      initiation of antibiotics    Strongly encourage escalation           of antibiotics                                     -----------------------------                                           PCT <= 0.25 ng/mL  OR                                        > 80% decrease in PCT                                     Discontinue / Do not initiate                                             antibiotics Performed at [facility], [address] [place], [place] [address]   Troponin I (High Sensitivity)     Status: None   Collection Time: 11/17/18  5:50 PM  Result Value Ref Range   Troponin I (High Sensitivity) 16 <18 ng/L    Comment: (NOTE) Elevated high sensitivity troponin I (hsTnI) values and significant  changes across serial measurements may suggest ACS but many other  chronic and acute conditions are known to elevate hsTnI results.  Refer to the "Links" section for chest pain algorithms and additional  guidance. Performed at [facility], [address] [place], [place] [address]   Troponin I  (High Sensitivity)     Status: Abnormal   Collection Time: 11/17/18  7:44 PM  Result Value Ref Range   Troponin I (High Sensitivity) 33 (H) <18 ng/L    Comment: (NOTE) Elevated high sensitivity troponin I (hsTnI) values and significant  changes across serial measurements may suggest ACS but many other  chronic and acute conditions are known to elevate hsTnI results.  Refer to the "Links" section for chest pain algorithms and additional  guidance. Performed at [facility], [address] [place], [place] [address]   Lactic acid, plasma     Status: Abnormal   Collection Time: 11/17/18  8:57 PM  Result Value Ref Range   Lactic Acid, Venous 3.3 (HH) 0.5 - 1.9 mmol/L    Comment: CRITICAL VALUE NOTED.  VALUE IS CONSISTENT WITH PREVIOUSLY REPORTED AND CALLED VALUE. Performed at [facility], [address], [place], [place] [address]   CBC with Differential/Platelet     Status: Abnormal   Collection Time: 11/18/18  5:31 AM  Result Value Ref Range   WBC 16.7 (H) 4.0 - 10.5 K/uL   RBC 3.49 (L) 3.87 - 5.11 MIL/uL   Hemoglobin 12.1 12.0 - 15.0 g/dL   HCT 98.3 (L) 38.2 - 50.5 %   MCV 100.6 (H) 80.0 - 100.0 fL   MCH 34.7 (H) 26.0 - 34.0 pg   MCHC 34.5 30.0 - 36.0 g/dL   RDW 39.7 67.3 - 41.9 %   Platelets 238 150 - 400 K/uL   nRBC 0.0 0.0 - 0.2 %   Neutrophils Relative % 87 %   Neutro Abs 14.5 (H) 1.7 - 7.7 K/uL   Lymphocytes Relative 6 %   Lymphs Abs 1.0 0.7 - 4.0 K/uL   Monocytes Relative 5 %   Monocytes Absolute 0.9 0.1 - 1.0 K/uL   Eosinophils Relative 0 %   Eosinophils Absolute 0.0 0.0 - 0.5 K/uL   Basophils Relative 0 %   Basophils Absolute 0.1 0.0 - 0.1 K/uL   Immature Granulocytes 2 %   Abs  Immature Granulocytes 0.29 (H) 0.00 - 0.07 K/uL    Comment: Performed at [facility], [address] [place], [place] [address]  Comprehensive metabolic panel     Status: Abnormal   Collection Time: 11/18/18  5:31 AM  Result Value Ref Range   Sodium 130 (L) 135 -  145 mmol/L   Potassium 4.0 3.5 - 5.1 mmol/L    Comment: DELTA CHECK NOTED   Chloride 95 (L) 98 - 111 mmol/L   CO2 23 22 - 32 mmol/L   Glucose, Bld 133 (H) 70 - 99 mg/dL   BUN 18 8 - 23 mg/dL   Creatinine, Ser 6.210.94 0.44 - 1.00 mg/dL   Calcium 8.8 (L) 8.9 - 10.3 mg/dL   Total Protein 6.2 (L) 6.5 - 8.1 g/dL   Albumin 2.7 (L) 3.5 - 5.0 g/dL   AST 37 15 - 41 U/L   ALT 23 0 - 44 U/L   Alkaline Phosphatase 44 38 - 126 U/L   Total Bilirubin 0.3 0.3 - 1.2 mg/dL   GFR calc non Af Amer 56 (L) >60 mL/min   GFR calc Af Amer >60 >60 mL/min   Anion gap 12 5 - 15    Comment: Performed at [facility], [address] [place], [place] [address]  Troponin I (High Sensitivity)     Status: Abnormal   Collection Time: 11/18/18  5:31 AM  Result Value Ref Range   Troponin I (High Sensitivity) 47 (H) <18 ng/L    Comment: (NOTE) Elevated high sensitivity troponin I (hsTnI) values and significant  changes across serial measurements may suggest ACS but many other  chronic and acute conditions are known to elevate hsTnI results.  Refer to the Links section for chest pain algorithms and additional  guidance. Performed at [facility], [address] [place], [place] [address]   CK     Status: Abnormal   Collection Time: 11/18/18  5:31 AM  Result Value Ref Range   Total CK 428 (H) 38 - 234 U/L    Comment: Performed at [facility], [address] [place], [place] [address]  Lactic acid, plasma     Status: None   Collection Time: 11/18/18  5:31 AM  Result Value Ref Range   Lactic Acid, Venous 1.9 0.5 - 1.9 mmol/L    Comment: Performed at [facility], [address] [place], [place] [address]  Culture, blood (routine x 2)     Status: Abnormal (Preliminary result)   Collection Time: 11/18/18  7:45 AM   Specimen: BLOOD LEFT ARM  Result Value Ref Range   Specimen Description BLOOD LEFT ARM    Special Requests      BOTTLES DRAWN AEROBIC ONLY Blood Culture results may not be  optimal due to an inadequate volume of blood received in culture bottles   Culture  Setup Time      AEROBIC BOTTLE ONLY GRAM POSITIVE COCCI CRITICAL VALUE NOTED.  VALUE IS CONSISTENT WITH PREVIOUSLY REPORTED AND CALLED VALUE.    Culture (A)     STAPHYLOCOCCUS AUREUS SUSCEPTIBILITIES TO FOLLOW Performed at [facility], [address] [place], [place] [address]    Report Status PENDING   Lactic acid, plasma     Status: Abnormal   Collection Time: 11/18/18  7:51 AM  Result Value Ref Range   Lactic Acid, Venous 2.4 (HH) 0.5 - 1.9 mmol/L    Comment: CRITICAL RESULT CALLED TO, READ  BACK BY AND VERIFIED WITH: K.JOHNSON,RN 1610 11/18/2018 CLARK,S Performed at [facility], [address], [place], [place] [address]   Culture, blood (routine x 2)     Status: Abnormal (Preliminary result)   Collection Time: 11/18/18  7:51 AM   Specimen: BLOOD LEFT HAND  Result Value Ref Range   Specimen Description BLOOD LEFT HAND    Special Requests      BOTTLES DRAWN AEROBIC ONLY Blood Culture results may not be optimal due to an inadequate volume of blood received in culture bottles   Culture  Setup Time      AEROBIC BOTTLE ONLY GRAM POSITIVE COCCI CRITICAL VALUE NOTED.  VALUE IS CONSISTENT WITH PREVIOUSLY REPORTED AND CALLED VALUE.    Culture (A)     STAPHYLOCOCCUS AUREUS SUSCEPTIBILITIES TO FOLLOW Performed at [facility], [address], [place], [place] [address]    Report Status PENDING   Troponin I (High Sensitivity)     Status: Abnormal   Collection Time: 11/18/18  7:51 AM  Result Value Ref Range   Troponin I (High Sensitivity) 39 (H) <18 ng/L    Comment: (NOTE) Elevated high sensitivity troponin I (hsTnI) values and significant  changes across serial measurements may suggest ACS but many other  chronic and acute conditions are known to elevate hsTnI results.  Refer to the "Links" section for chest pain algorithms and additional  guidance. Performed at [facility], [address] [place], [place] [address]     Ct Abdomen Pelvis Wo Contrast  Result Date: 11/17/2018 CLINICAL DATA:  85 year old female with history of chest and back pain. Suspected acute aortic dissection. EXAM: CT CHEST, ABDOMEN AND PELVIS WITHOUT CONTRAST TECHNIQUE: Multidetector CT imaging of the chest, abdomen and pelvis was performed following the standard protocol without IV contrast. COMPARISON:  Chest CTA 01/31/2017. CT the abdomen and pelvis 09/09/2008. FINDINGS: CT CHEST FINDINGS Cardiovascular: Heart size is normal. There is no significant pericardial fluid, thickening or pericardial calcification. There is aortic atherosclerosis, as well as atherosclerosis of the great vessels of the mediastinum and the coronary arteries, including calcified atherosclerotic plaque in the right coronary artery. No crescentic high attenuation associated with the wall of the thoracic aorta to suggest the presence of acute intramural hemorrhage. Accurate assessment for aortic dissection is not possible on today's noncontrast examination. Mediastinum/Nodes: No pathologically enlarged mediastinal or hilar lymph nodes. Please note that accurate exclusion of hilar adenopathy is limited on noncontrast CT scans. Esophagus is unremarkable in appearance. No axillary lymphadenopathy. Lungs/Pleura: No acute consolidative airspace disease. No pleural effusions. Multiple linear areas of architectural distortion throughout the lungs bilaterally, favored to reflect areas of chronic post infectious or inflammatory scarring. No suspicious appearing pulmonary nodules or masses are noted. Musculoskeletal: There are no aggressive appearing lytic or blastic lesions noted in the visualized portions of the skeleton. CT ABDOMEN PELVIS FINDINGS Hepatobiliary: No definite suspicious cystic or solid hepatic lesions are confidently identified on today's noncontrast CT examination. Unenhanced appearance of the gallbladder is normal.  Pancreas: No definite pancreatic mass or peripancreatic fluid collections or inflammatory changes are noted on today's noncontrast examination. Spleen: Unremarkable. Adrenals/Urinary Tract: 2.6 cm exophytic low-attenuation lesion in upper pole of the left kidney, incompletely characterized on today's non-contrast CT examination, but statistically likely to represent a cyst. Unenhanced appearance of the right kidney and bilateral adrenal glands is normal. No hydroureteronephrosis. Urinary bladder is unremarkable in appearance. Stomach/Bowel: Unenhanced appearance of the stomach is normal. No pathologic dilatation of  small bowel or colon. The appendix is not confidently identified and may be surgically absent. Regardless, there are no inflammatory changes noted adjacent to the cecum to suggest the presence of an acute appendicitis at this time. Vascular/Lymphatic: Aortic atherosclerosis. No crescentic high attenuation associated with the wall of the abdominal aorta to suggest acute intramural hematoma. No lymphadenopathy noted in the abdomen or pelvis. Reproductive: Status post hysterectomy. Ovaries are not confidently identified and may be surgically absent or atrophic. Other: No significant volume of ascites.  No pneumoperitoneum. Musculoskeletal: There are no aggressive appearing lytic or blastic lesions noted in the visualized portions of the skeleton. IMPRESSION: 1. No definitive imaging findings on today's noncontrast CT examination to suggest acute aortic syndrome. 2. No acute findings are noted in the chest, abdomen or pelvis to account for the patient's symptoms. 3. Aortic atherosclerosis, in addition to right coronary artery disease. 4. Additional incidental findings, as above. Electronically Signed   By: Trudie Reed M.D.   On: 11/17/2018 17:42   Ct Chest Wo Contrast  Result Date: 11/17/2018 CLINICAL DATA:  85 year old female with history of chest and back pain. Suspected acute aortic dissection.  EXAM: CT CHEST, ABDOMEN AND PELVIS WITHOUT CONTRAST TECHNIQUE: Multidetector CT imaging of the chest, abdomen and pelvis was performed following the standard protocol without IV contrast. COMPARISON:  Chest CTA 01/31/2017. CT the abdomen and pelvis 09/09/2008. FINDINGS: CT CHEST FINDINGS Cardiovascular: Heart size is normal. There is no significant pericardial fluid, thickening or pericardial calcification. There is aortic atherosclerosis, as well as atherosclerosis of the great vessels of the mediastinum and the coronary arteries, including calcified atherosclerotic plaque in the right coronary artery. No crescentic high attenuation associated with the wall of the thoracic aorta to suggest the presence of acute intramural hemorrhage. Accurate assessment for aortic dissection is not possible on today's noncontrast examination. Mediastinum/Nodes: No pathologically enlarged mediastinal or hilar lymph nodes. Please note that accurate exclusion of hilar adenopathy is limited on noncontrast CT scans. Esophagus is unremarkable in appearance. No axillary lymphadenopathy. Lungs/Pleura: No acute consolidative airspace disease. No pleural effusions. Multiple linear areas of architectural distortion throughout the lungs bilaterally, favored to reflect areas of chronic post infectious or inflammatory scarring. No suspicious appearing pulmonary nodules or masses are noted. Musculoskeletal: There are no aggressive appearing lytic or blastic lesions noted in the visualized portions of the skeleton. CT ABDOMEN PELVIS FINDINGS Hepatobiliary: No definite suspicious cystic or solid hepatic lesions are confidently identified on today's noncontrast CT examination. Unenhanced appearance of the gallbladder is normal. Pancreas: No definite pancreatic mass or peripancreatic fluid collections or inflammatory changes are noted on today's noncontrast examination. Spleen: Unremarkable. Adrenals/Urinary Tract: 2.6 cm exophytic low-attenuation  lesion in upper pole of the left kidney, incompletely characterized on today's non-contrast CT examination, but statistically likely to represent a cyst. Unenhanced appearance of the right kidney and bilateral adrenal glands is normal. No hydroureteronephrosis. Urinary bladder is unremarkable in appearance. Stomach/Bowel: Unenhanced appearance of the stomach is normal. No pathologic dilatation of small bowel or colon. The appendix is not confidently identified and may be surgically absent. Regardless, there are no inflammatory changes noted adjacent to the cecum to suggest the presence of an acute appendicitis at this time. Vascular/Lymphatic: Aortic atherosclerosis. No crescentic high attenuation associated with the wall of the abdominal aorta to suggest acute intramural hematoma. No lymphadenopathy noted in the abdomen or pelvis. Reproductive: Status post hysterectomy. Ovaries are not confidently identified and may be surgically absent or atrophic. Other: No significant volume of ascites.  No pneumoperitoneum. Musculoskeletal: There are no aggressive appearing lytic or blastic lesions noted in the visualized portions of the skeleton. IMPRESSION: 1. No definitive imaging findings on today's noncontrast CT examination to suggest acute aortic syndrome. 2. No acute findings are noted in the chest, abdomen or pelvis to account for the patient's symptoms. 3. Aortic atherosclerosis, in addition to right coronary artery disease. 4. Additional incidental findings, as above. Electronically Signed   By: Vinnie Langton M.D.   On: 11/17/2018 17:42   Mr Thoracic Spine W Wo Contrast  Result Date: 11/18/2018 CLINICAL DATA:  Back pain EXAM: MRI THORACIC WITHOUT AND WITH CONTRAST TECHNIQUE: Multiplanar and multiecho pulse sequences of the thoracic spine were obtained without and with intravenous contrast. CONTRAST:  18mL GADAVIST GADOBUTROL 1 MMOL/ML IV SOLN COMPARISON:  None. FINDINGS: MRI THORACIC SPINE FINDINGS Alignment:   Physiologic. Vertebrae: No fracture, evidence of discitis, or bone lesion. Cord:  Normal signal and morphology. Paraspinal and other soft tissues: Small pleural effusions Disc levels: At T7-8, there is an intermediate sized central disc protrusion that effaces the ventral thecal sac and contacts the anterior surface of the spinal cord. There is no spinal canal stenosis. At T8-9, there is a small right subarticular disc protrusion without stenosis. At T9-10, there is a small disc osteophyte complex, greatest in the right subarticular zone, without spinal canal stenosis. At T11-12, there is a left subarticular disc protrusion without causing stenosis. IMPRESSION: 1. No discitis-osteomyelitis or epidural abscess. 2. 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 3. Central disc protrusion at T7-8 contacting the ventral surface of the spinal cord. Electronically Signed   By: Ulyses Jarred M.D.   On: 11/18/2018 21:51   Mr Lumbar Spine W Wo Contrast  Result Date: 11/18/2018 CLINICAL DATA:  Back pain. Nausea and vomiting. EXAM: MRI LUMBAR SPINE WITHOUT AND WITH CONTRAST TECHNIQUE: Multiplanar and multiecho pulse sequences of the lumbar spine were obtained without and with intravenous contrast. CONTRAST:  60mL GADAVIST GADOBUTROL 1 MMOL/ML IV SOLN COMPARISON:  None. FINDINGS: Segmentation:  Normal Alignment:  Dextroscoliosis with apex at L3 Vertebrae: No acute compression fracture, discitis-osteomyelitis, facet edema or other focal marrow lesion. No epidural collection. Conus medullaris and cauda equina: Conus extends to the L2 level. Conus and cauda equina appear normal. Paraspinal and other soft tissues: Negative. Disc levels: T12-L1: Normal disc space and facets. No spinal canal or neuroforaminal stenosis. L1-L2: Disc space narrowing with endplate spurring. No spinal canal stenosis. Severe right neural foraminal stenosis. L2-L3: Disc space narrowing with endplate spurring. Moderate bilateral  neural foraminal stenosis. Mild facet hypertrophy. No central spinal canal stenosis. L3-L4: Disc space narrowing with diffuse bulge. Moderate facet hypertrophy. Mild spinal canal stenosis with severe narrowing of the left lateral recess. Mild right and severe left neural foraminal stenosis. L4-L5: Small disc bulge with mild facet hypertrophy. No spinal canal stenosis. Mild right and severe left neural foraminal stenosis. L5-S1: Small disc bulge with severe right facet hypertrophy. There is severe right neural foraminal stenosis and narrowing of the right lateral recess. No central spinal canal or left neural foraminal stenosis. Visualized sacrum: Normal. IMPRESSION: 1. No epidural abscess or discitis-osteomyelitis. 2. 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 3. Multilevel moderate-to-severe neural foraminal stenosis. Electronically Signed   By: Ulyses Jarred M.D.   On: 11/18/2018 21:44   Mr Sacrum Si Joints W Wo Contrast  Result Date: 11/18/2018 CLINICAL DATA:  Back pain.  Possible infection. EXAM: MRI PELVIS  WITHOUT AND WITH CONTRAST TECHNIQUE: Multiplanar multisequence MR imaging of the pelvis was performed both before and after administration of intravenous contrast. CONTRAST:  6mL GADAVIST GADOBUTROL 1 MMOL/ML IV SOLN COMPARISON:  CT scan from yesterday. FINDINGS: The bony structures are intact. No stress fracture or bone lesion. No findings suggests sacroiliitis or septic arthritis. Mild inflammatory changes involving the right piriformis muscle and also the upper aspect of the right iliopsoas muscle. This could reflect nonspecific myositis. Both hips are normally located. Small joint effusions but no findings suspicious for septic arthritis. No evidence of avascular necrosis. No significant intrapelvic abnormalities are identified. IMPRESSION: 1. No findings suspicious for septic arthritis or osteomyelitis involving the sacrum or SI joints or hip  joints. 2. Areas of nonspecific myositis mainly involving the right piriformis muscle and the right iliopsoas muscle. 3. No significant intrapelvic abnormalities. Electronically Signed   By: Rudie Meyer M.D.   On: 11/18/2018 21:36    Review of Systems - Negative except per HPI    Blood pressure (!) 146/68, pulse 92, temperature 99.1 F (37.3 C), temperature source Oral, resp. rate 16, height  (1.6 m), weight 68 kg, SpO2 92 %. Physical Exam  Constitutional: She is oriented to person, place, and time. She appears well-developed and well-nourished.  HENT:  Head: Normocephalic.  Eyes: Pupils are equal, round, and reactive to light. EOM are normal.  Neck: Normal range of motion. Neck supple.  Musculoskeletal:        General: Tenderness present.     Left shoulder: She exhibits tenderness.     Lumbar back: She exhibits tenderness.       Arms:  Neurological: She is alert and oriented to person, place, and time. She has normal strength and normal reflexes. No cranial nerve deficit or sensory deficit. GCS eye subscore is 4. GCS verbal subscore is 5. GCS motor subscore is 6.  Patient has tenderness over low back in region of right SI joint.  She is resting comfortably in bed, but has pain on attempting to stand and cries out with low back pain, particularly on the right in the region of the right SI joint.  She does not appear to complain of leg pain or radiculopathy.  Her left shoulder is also tender with decreased ROM and point tenderness.    The patient appears cognitively impaired compared to her normal baseline.  She is oriented, but becomes disoriented and confused.  She also does not seem to recall events from earlier in the day.      Assessment/Plan: Patient is normally much sharper than she appears today.  This may reflect medication or prolonged hospitalization, but I am concerned that she may have had CVA or other cause for decline, although this may reflect general weakness and  illness related to bacteremia.  She complains of focal left shoulder pain and also low back pain.  She has baseline lumbar scoliosis and stenosis and is not a surgical candidate.  I do not see the suggestion of ESA, which is certainly a possibility, given her recent bacteremia and acute low back pain.  I would continue to work on mobilizing patient with PT as she is able to tolerate.    Dorian Heckle, MD 11/19/2018, 10:28 AM

## 2018-11-19 NOTE — Consult Note (Addendum)
Neurology Consultation  Reason for Consult: Stroke visualized on CT Referring Physician: Dr. Margo Aye  History is obtained from: Majority of information was obtained from chart as patient is not a good historian at this time  HPI: Autumn [name] is a 85 y.o. female with history of peripheral neuropathy, venous insufficiency, osteopenia, left bundle branch block, hypertension and anxiety.  Patient presents to the hospital secondary to nausea vomiting.  She also had complaints of feeling lightheaded, generalized weakness, shortness of breath and neck pain and back pain.  While in the hospital patient was found to have new onset systolic heart failure with ejection fraction that appeared to be around 25% with reduced function.  Also during hospitalization on 10/2 patient was found to be unresponsive on the bedpan with heart rate in the 40s-50s and blood pressure 71/40.  Hospitalization patient was complaining of severe low back pain which was 10/10.  She was also noted to have abdomen which was distended and the bladder scan revealed 287 cc.  Due to these multiple problems, back pain that was unrelenting neurosurgery was consulted for further assessment.  At that time neurosurgery was concerned that patient may have had a stroke due to decline in mentation.  Thus CT scan of head was obtained CT head of brain did show an acute/subacute nonhemorrhagic stroke involving the left superior cerebellar hemisphere extending into the left cerebellar peduncle.  On consultation patient is in severe pain, takes prolonged time to answer questions, is not a detailed historian.  She states that she does feel dizzy and the dizziness started on Sunday.  She states that the room is moving however cannot tell me if it is right to left or left to right.  I believe that the severe pain that she is and is limiting her thought process and allowing her to answer much of my questions.  She states that the room spinning started on Sunday and  after multiple questions still continued to state it happened on Sunday.  Patient states that she is never had any of the symptoms before.  Her main complaint at this point is severe pain in her back and in her neck.    LKW: Per patient on 11/17/2018 tpa given?: no, out of window Premorbid modified Rankin scale (mRS): 0 NIHSS 1a Level of Conscious.: 0 1b LOC Questions: 0 1c LOC Commands: 0 2 Best Gaze: 0 3 Visual: 0 4 Facial Palsy: 0 5a Motor Arm - left: 0 5b Motor Arm - Right: 0 6a Motor Leg - Left: 2 6b Motor Leg - Right: 2 7 Limb Ataxia: 1 8 Sensory: 0 9 Best Language: 0 10 Dysarthria: 0 11 Extinct. and Inatten.: 0 TOTAL: 4 (nonfocal-both legs drift due to pain)    ROS:ROS was performed and is negative except as noted in the HPI.  Past Medical History:  Diagnosis Date  . Anxiety   . GERD (gastroesophageal reflux disease)   . Hypertension   . Hypothyroid   . LBBB (left bundle branch block)   . Osteopenia   . Peripheral neuropathy   . Venous insufficiency      Family History  Problem Relation Age of Onset  . Hypertension Mother   . Prostate cancer Father    Social History:   reports that she has never smoked. She has never used smokeless tobacco. She reports current alcohol use. She reports that she does not use drugs.  Medications  Current Facility-Administered Medications:  .  acetaminophen (TYLENOL) tablet 650 mg, 650  mg, Oral, Q6H PRN, 650 mg at 11/19/18 1353 **OR** acetaminophen (TYLENOL) suppository 650 mg, 650 mg, Rectal, Q6H PRN, Smith, Rondell A, MD .  albuterol (PROVENTIL) (2.5 MG/3ML) 0.083% nebulizer solution 2.5 mg, 2.5 mg, Nebulization, Q6H PRN, Smith, Rondell A, MD .  alum & mag hydroxide-simeth (MAALOX/MYLANTA) 200-200-20 MG/5ML suspension 30 mL, 30 mL, Oral, Q6H PRN, Katrinka BlazingSmith, Rondell A, MD, 30 mL at 11/13/18 0030 .  carvedilol (COREG) tablet 12.5 mg, 12.5 mg, Oral, BID, O'Neal, Ronnald RampWesley Thomas, MD, 12.5 mg at 11/19/18 0757 .  diclofenac sodium  (VOLTAREN) 1 % transdermal gel 4 g, 4 g, Topical, QID PRN, Smith, Rondell A, MD .  feeding supplement (ENSURE ENLIVE) (ENSURE ENLIVE) liquid 237 mL, 237 mL, Oral, BID BM, Hall, Carole N, DO, 237 mL at 11/19/18 0804 .  HYDROcodone-acetaminophen (NORCO/VICODIN) 5-325 MG per tablet 1 tablet, 1 tablet, Oral, Q6H PRN, Darlin DropHall, Carole N, DO, 1 tablet at 11/19/18 0357 .  influenza vaccine adjuvanted (FLUAD) injection 0.5 mL, 0.5 mL, Intramuscular, Tomorrow-1000, Hall, Carole N, DO .  labetalol (NORMODYNE) injection 5 mg, 5 mg, Intravenous, Q4H PRN, Hall, Carole N, DO .  levothyroxine (SYNTHROID) tablet 50 mcg, 50 mcg, Oral, QAC breakfast, Smith, Rondell A, MD, 50 mcg at 11/19/18 0358 .  lidocaine (LIDODERM) 5 % 1 patch, 1 patch, Transdermal, Q24H, Hall, Carole N, DO, 1 patch at 11/19/18 1126 .  LORazepam (ATIVAN) tablet 0.5 mg, 0.5 mg, Oral, QHS PRN, Katrinka BlazingSmith, Rondell A, MD, 0.5 mg at 11/16/18 2200 .  losartan (COZAAR) tablet 100 mg, 100 mg, Oral, Daily, O'Neal, Ronnald RampWesley Thomas, MD, 100 mg at 11/19/18 0758 .  methocarbamol (ROBAXIN) 500 mg in dextrose 5 % 50 mL IVPB, 500 mg, Intravenous, Q8H PRN, Darlin DropHall, Carole N, DO, Last Rate: 100 mL/hr at 11/17/18 2004, 500 mg at 11/17/18 2004 .  naloxone (NARCAN) injection 0.4 mg, 0.4 mg, Intravenous, PRN, Hall, Carole N, DO .  pantoprazole (PROTONIX) EC tablet 40 mg, 40 mg, Oral, BID, Dow AdolphHall, Carole N, DO, 40 mg at 11/19/18 0757 .  senna-docusate (Senokot-S) tablet 2 tablet, 2 tablet, Oral, BID, Darlin DropHall, Carole N, DO, 2 tablet at 11/19/18 0755 .  sodium chloride flush (NS) 0.9 % injection 3 mL, 3 mL, Intravenous, Q12H, Smith, Rondell A, MD, 3 mL at 11/19/18 0805 .  traMADol (ULTRAM) tablet 50 mg, 50 mg, Oral, Q6H PRN, Dow AdolphHall, Carole N, DO, 50 mg at 11/19/18 1152 .  [START ON 11/20/2018] vancomycin (VANCOCIN) IVPB 750 mg/150 ml premix, 750 mg, Intravenous, Q24H, Della GooSinclair, Emily S, RPH .  vitamin B-12 (CYANOCOBALAMIN) tablet 1,000 mcg, 1,000 mcg, Oral, Daily, Madelyn FlavorsSmith, Rondell A, MD, 1,000  mcg at 11/19/18 0757   Exam: Current vital signs: BP (!) 117/45 (BP Location: Right Arm)   Pulse 87   Temp 98.8 F (37.1 C) (Oral)   Resp 16   Ht 5\' 3"  (1.6 m)   Wt 68 kg   SpO2 92%   BMI 26.56 kg/m  Vital signs in last 24 hours: Temp:  [98.8 F (37.1 C)-99.2 F (37.3 C)] 98.8 F (37.1 C) (10/06 1449) Pulse Rate:  [87-96] 87 (10/06 1449) Resp:  [16-20] 16 (10/06 1449) BP: (117-146)/(45-71) 117/45 (10/06 1449) SpO2:  [92 %] 92 % (10/06 1449) Weight:  [68 kg] 68 kg (10/06 0345)  Physical Exam  Constitutional: Appears well-developed and well-nourished.  Psych: Affect appropriate to situation Eyes: No scleral injection HENT: No OP obstrucion Head: Normocephalic.  Cardiovascular: Normal rate and regular rhythm.  Respiratory: Effort normal, non-labored breathing GI: Soft.  No distension. There is no tenderness.  Skin: WDI  Neuro: Mental Status: Patient is awake, alert, oriented to hospital Due to severe pain unable to give me a clear and coherent history No signs of aphasia or neglect Cranial Nerves: II: Visual Fields are full.  III,IV, VI: EOMI without ptosis or diploplia. Pupils equal, round and reactive to light V: Facial sensation is symmetric to temperature VII: Facial movement is symmetric.  VIII: hearing is intact to voice X: Palat elevates symmetrically XI: Shoulder shrug is symmetric. XII: tongue is midline without atrophy or fasciculations.  Motor: Patient will raise her right arm antigravity but with any type of force or resistance she lets it fall to the bed secondary to pain.  Patient will not move her left arm secondary to pain.  Patient shows quick 4/5 strength in bilateral legs however due to severe pain she screams out and will not participate in exam. Sensory: Sensation is symmetric to light touch and temperature in the arms and legs. Deep Tendon Reflexes: 2+ and symmetric in the biceps and patellae.  Plantars: Toes are downgoing bilaterally.   Cerebellar: Finger-nose-finger on the right was within normal limits I could not get her to take part in the left finger-nose-finger or heel-to-shin on either leg secondary to pain  Labs I have reviewed labs in epic and the results pertinent to this consultation are:   CBC    Component Value Date/Time   WBC 16.7 (H) 11/18/2018 0531   RBC 3.49 (L) 11/18/2018 0531   HGB 12.1 11/18/2018 0531   HCT 35.1 (L) 11/18/2018 0531   HCT 34.5 11/12/2018 1345   PLT 238 11/18/2018 0531   MCV 100.6 (H) 11/18/2018 0531   MCH 34.7 (H) 11/18/2018 0531   MCHC 34.5 11/18/2018 0531   RDW 12.6 11/18/2018 0531   LYMPHSABS 1.0 11/18/2018 0531   MONOABS 0.9 11/18/2018 0531   EOSABS 0.0 11/18/2018 0531   BASOSABS 0.1 11/18/2018 0531    CMP     Component Value Date/Time   NA 130 (L) 11/18/2018 0531   K 4.0 11/18/2018 0531   CL 95 (L) 11/18/2018 0531   CO2 23 11/18/2018 0531   GLUCOSE 133 (H) 11/18/2018 0531   BUN 18 11/18/2018 0531   CREATININE 0.94 11/18/2018 0531   CALCIUM 8.8 (L) 11/18/2018 0531   PROT 6.2 (L) 11/18/2018 0531   ALBUMIN 2.7 (L) 11/18/2018 0531   AST 37 11/18/2018 0531   ALT 23 11/18/2018 0531   ALKPHOS 44 11/18/2018 0531   BILITOT 0.3 11/18/2018 0531   GFRNONAA 56 (L) 11/18/2018 0531   GFRAA >60 11/18/2018 0531    Imaging I have reviewed the images obtained:  CT-scan of the brain-acute/subacute nonhemorrhagic stroke involving the left superior cerebellar hemisphere extending into the left cerebellar peduncle  MRI examination of the brain-pending  Felicie Morn PA-C [facility] [phone]  M-F  (9:00 am- 5:00 PM)  11/19/2018, 4:27 PM    Assessment:  85 year old female presenting to the hospital secondary to nausea vomiting and generalized malaise.  Secondary to waxing and waning mentation and it was recommended that patient get a CT of head.  CT of head did show a what looks like subacute infarct involving the left superior cerebellar hemisphere which  extends into the left cerebellar peduncle.   Impression: -Severe back pain - Subacute infarct involving the cerebellum - Dizziness/vertigo  Recommend -MRI of brain without contrast -MRA head and neck -No aspirin or chemical DVT prophylaxis thus use SCDs due to the  fact this could be secondary to endocarditis.  Other etiologies-cardioembolic from the recent cardiomyopathy. -Might need to rule out possibility of endocarditis with TEE which will be obtained at 1400 hrs. on 11/20/2018 -A1c -Lipid panel -Frequent neurochecks - PT OT speech therapy -Telemetry   Attending Neurohospitalist Addendum Patient seen and examined with APP/Resident. Agree with the history and physical as documented above. Agree with the plan as documented, which I helped formulate. I have independently reviewed the chart, obtained history, review of systems and examined the patient.I have personally reviewed pertinent head/neck/spine imaging (CT/MRI).  CT shows a subacute to acute stroke in the left superior cerebral hemisphere extending into the left cerebellar peduncle.  Etiology could be cardioembolic given the history of severe cardiomyopathy versus infective endocarditis causing the stroke. Exam is marred by pain.  She did not lift both her legs well because of severe back pain. On my examination she is awake alert oriented x3.  Speech is non-dysarthric.  Both upper extremities are antigravity but drift to bed because of pain.  Both legs are very difficult to raise from above the bed again mainly due to pain but unclear how much of this is weakness.  She does have some left arm ataxia on finger-nose-finger testing, again this has been overshadowed with a lot of pain and is difficult to elicit. I agree with the plan above that I have not helped formulate an addended to. Stroke team will follow with you  Please feel free to call with any questions. --- Amie Portland, MD [facility] Pager:  [phone]  If 7pm to 7am, please call on call as listed on AMION.  \

## 2018-11-20 ENCOUNTER — Encounter (HOSPITAL_COMMUNITY)
Admission: EM | Disposition: A | Discharge status: Skilled Nursing Facility | Payer: Self-pay | Source: Home / Self Care | Attending: Internal Medicine

## 2018-11-20 ENCOUNTER — Inpatient Hospital Stay (HOSPITAL_COMMUNITY): Payer: Medicare Other | Admitting: Anesthesiology

## 2018-11-20 ENCOUNTER — Encounter (HOSPITAL_COMMUNITY): Payer: Self-pay | Admitting: Radiology

## 2018-11-20 ENCOUNTER — Inpatient Hospital Stay (HOSPITAL_COMMUNITY): Payer: Medicare Other

## 2018-11-20 DIAGNOSIS — I059 Rheumatic mitral valve disease, unspecified: Secondary | ICD-10-CM

## 2018-11-20 DIAGNOSIS — B9561 Methicillin susceptible Staphylococcus aureus infection as the cause of diseases classified elsewhere: Secondary | ICD-10-CM

## 2018-11-20 DIAGNOSIS — I6340 Cerebral infarction due to embolism of unspecified cerebral artery: Secondary | ICD-10-CM | POA: Insufficient documentation

## 2018-11-20 DIAGNOSIS — I340 Nonrheumatic mitral (valve) insufficiency: Secondary | ICD-10-CM

## 2018-11-20 DIAGNOSIS — I330 Acute and subacute infective endocarditis: Secondary | ICD-10-CM

## 2018-11-20 DIAGNOSIS — I361 Nonrheumatic tricuspid (valve) insufficiency: Secondary | ICD-10-CM

## 2018-11-20 DIAGNOSIS — I058 Other rheumatic mitral valve diseases: Secondary | ICD-10-CM

## 2018-11-20 HISTORY — PX: TEE WITHOUT CARDIOVERSION: SHX5443

## 2018-11-20 LAB — CBC
HCT: 29.2 % — ABNORMAL LOW (ref 36.0–46.0)
Hemoglobin: 10 g/dL — ABNORMAL LOW (ref 12.0–15.0)
MCH: 34.7 pg — ABNORMAL HIGH (ref 26.0–34.0)
MCHC: 34.2 g/dL (ref 30.0–36.0)
MCV: 101.4 fL — ABNORMAL HIGH (ref 80.0–100.0)
Platelets: 180 10*3/uL (ref 150–400)
RBC: 2.88 MIL/uL — ABNORMAL LOW (ref 3.87–5.11)
RDW: 12.8 % (ref 11.5–15.5)
WBC: 12.3 10*3/uL — ABNORMAL HIGH (ref 4.0–10.5)
nRBC: 0 % (ref 0.0–0.2)

## 2018-11-20 LAB — BASIC METABOLIC PANEL
Anion gap: 11 (ref 5–15)
BUN: 22 mg/dL (ref 8–23)
CO2: 22 mmol/L (ref 22–32)
Calcium: 8.5 mg/dL — ABNORMAL LOW (ref 8.9–10.3)
Chloride: 94 mmol/L — ABNORMAL LOW (ref 98–111)
Creatinine, Ser: 0.85 mg/dL (ref 0.44–1.00)
GFR calc Af Amer: >60 mL/min (ref >60)
GFR calc non Af Amer: >60 mL/min (ref >60)
Glucose, Bld: 156 mg/dL — ABNORMAL HIGH (ref 70–99)
Potassium: 4 mmol/L (ref 3.5–5.1)
Sodium: 127 mmol/L — ABNORMAL LOW (ref 135–145)

## 2018-11-20 LAB — CULTURE, BLOOD (ROUTINE X 2): Special Requests: ADEQUATE

## 2018-11-20 LAB — C-REACTIVE PROTEIN: CRP: 37.4 mg/dL — ABNORMAL HIGH (ref <1.0)

## 2018-11-20 LAB — SEDIMENTATION RATE: Sed Rate: 95 mm/hr — ABNORMAL HIGH (ref 0–22)

## 2018-11-20 IMAGING — CT CT ANGIO HEAD
2 of 7 series · 8 of 33 positions shown · IV contrast (APPLIED)
Comparison: None.

CLINICAL DATA: Stroke follow-up

EXAM:
CT ANGIOGRAPHY HEAD AND NECK
TECHNIQUE: Multidetector CT imaging of the head and neck was performed using
the standard protocol during bolus administration of intravenous
contrast. Multiplanar CT image reconstructions and MIPs were
obtained to evaluate the vascular anatomy. Carotid stenosis
measurements (when applicable) are obtained utilizing NASCET
criteria, using the distal internal carotid diameter as the
denominator.
CONTRAST:  75mL OMNIPAQUE IOHEXOL 350 MG/ML SOLN

[Series 5: cta neck/head · axial · 0.46mm/px · z∈[-668,-564]mm · 2 of 156 slices shown]
[im 52/156  soft-tissue]
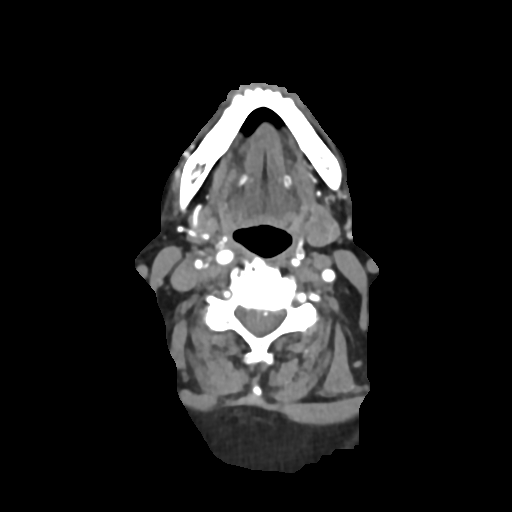
[im 104/156  soft-tissue]
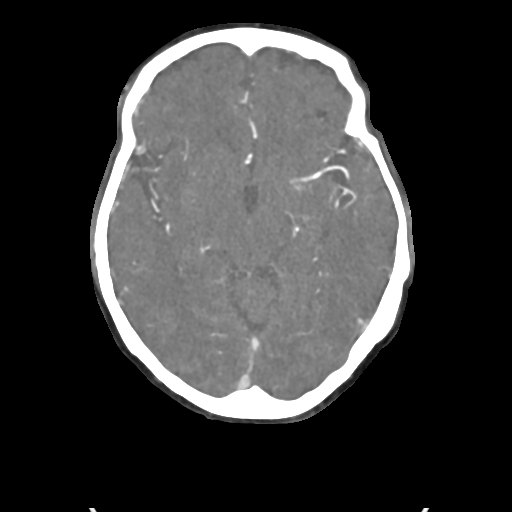

[Series 7: ax thins · axial · 0.39mm/px · z∈[-726,-505]mm · 6 of 311 slices shown]
[im 45/311  soft-tissue]
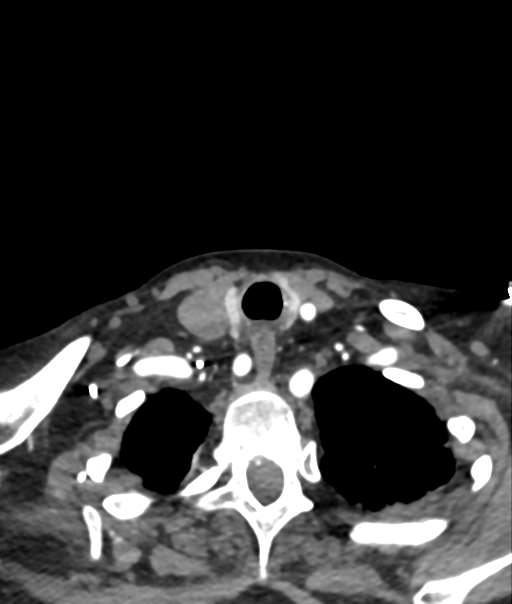
[im 89/311  bone]
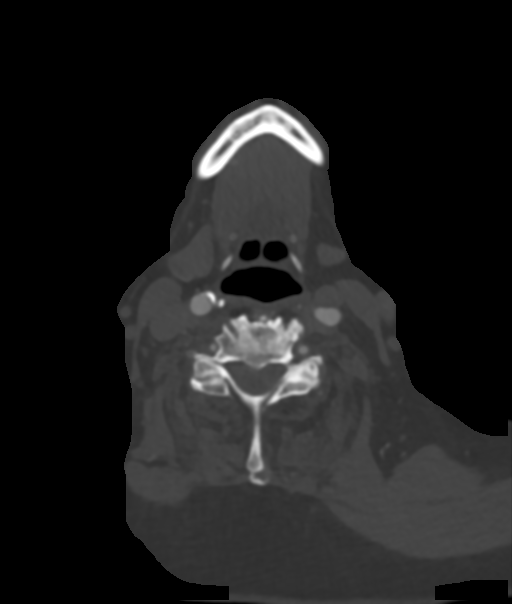
[im 133/311  soft-tissue]
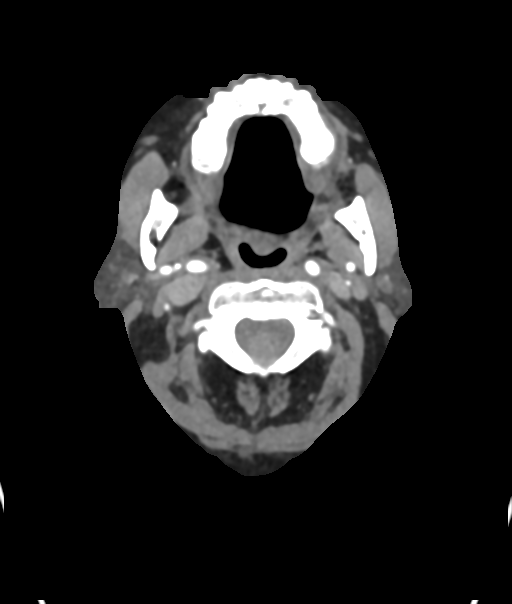
[im 178/311  bone]
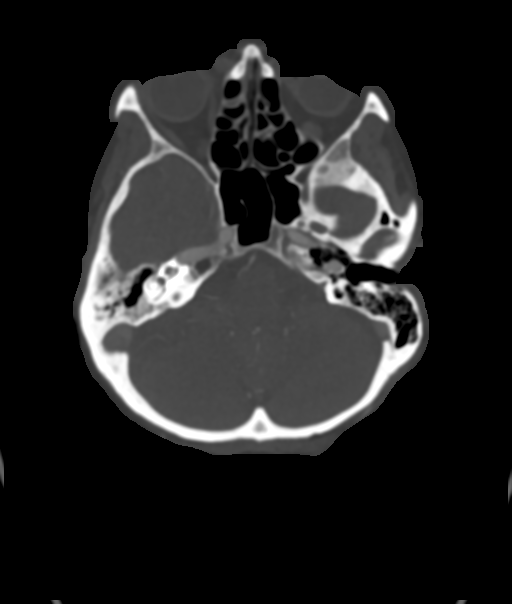
[im 222/311  soft-tissue]
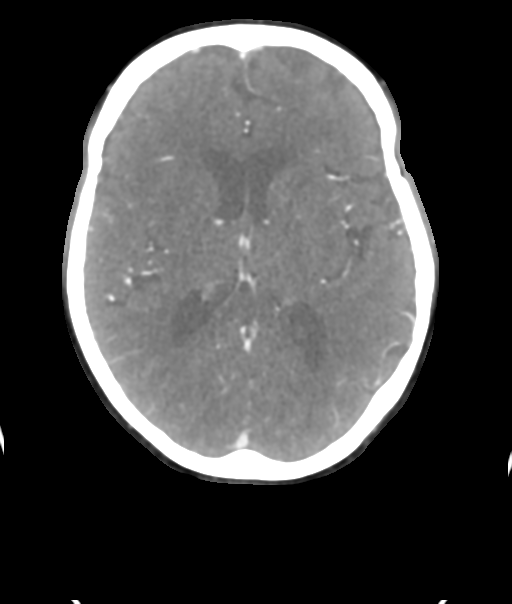
[im 266/311  bone]
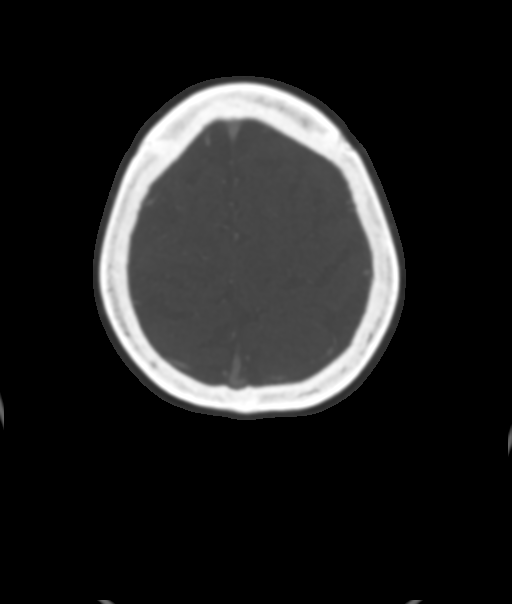

[8 of 33 positions shown; findings below may reference images not displayed]

FINDINGS: CTA NECK FINDINGS

SKELETON: There is no bony spinal canal stenosis. No lytic or
blastic lesion.

OTHER NECK: Normal pharynx, larynx and major salivary glands. No
cervical lymphadenopathy. Unremarkable thyroid gland.

UPPER CHEST: Biapical scarring

AORTIC ARCH:

There is mild calcific atherosclerosis of the aortic arch. There is
no aneurysm, dissection or hemodynamically significant stenosis of
the visualized portion of the aorta. Conventional 3 vessel aortic
branching pattern. There is approximately 50% stenosis of the
proximal left subclavian artery, which is otherwise normal. Right
subclavian artery visualized portion is normal.

RIGHT CAROTID SYSTEM: No dissection, occlusion or aneurysm. Mild
atherosclerotic calcification at the carotid bifurcation without
hemodynamically significant stenosis.

LEFT CAROTID SYSTEM: Normal without aneurysm, dissection or
stenosis.

VERTEBRAL ARTERIES: Left dominant configuration. Both origins are
clearly patent. There is no dissection, occlusion or flow-limiting
stenosis to the skull base (V1-V3 segments).

CTA HEAD FINDINGS

POSTERIOR CIRCULATION:

--Vertebral arteries: Normal V4 segments.

--Posterior inferior cerebellar arteries (PICA): Patent origins from
the vertebral arteries.

--Anterior inferior cerebellar arteries (AICA): Patent origins from
the basilar artery.

--Basilar artery: Diminutive basilar artery

--Superior cerebellar arteries: Normal.

--Posterior cerebral arteries: Both are predominantly supplied by
the posterior communicating arteries (p-comm). Moderate stenosis of
the proximal left P2 segment (series 11, image 20).

ANTERIOR CIRCULATION:

--Intracranial internal carotid arteries: Normal.

--Anterior cerebral arteries (ACA): Normal. Both A1 segments are
present. Patent anterior communicating artery (a-comm).

--Middle cerebral arteries (MCA): Normal.

VENOUS SINUSES: As permitted by contrast timing, patent.

ANATOMIC VARIANTS: None

Review of the MIP images confirms the above findings.
IMPRESSION: 1. No emergent large vessel occlusion.
2. Moderate stenosis of the proximal left PCA P2 segment.
3. Unchanged appearance of left cerebellar infarct.
4. Aortic Atherosclerosis ([KP]-[KP]).

## 2018-11-20 IMAGING — MR MR HEAD W/O CM
12 of 13 series · 43 of 48 positions shown · non-contrast
Comparison: None.

CLINICAL DATA: Stroke follow-up

EXAM:
MRI HEAD WITHOUT CONTRAST
TECHNIQUE: Multiplanar, multiecho pulse sequences of the brain and surrounding
structures were obtained without intravenous contrast.

[Series 5: DWI · axial · 3.0mm · 0.88mm/px · z∈[-123,+17]mm · 6 of 96 slices shown (1 of 4)]
[im 1/96]
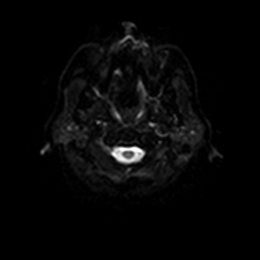
[im 20/96]
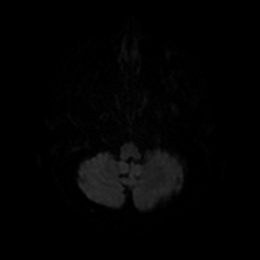
[im 39/96]
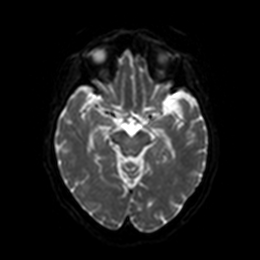
[im 58/96]
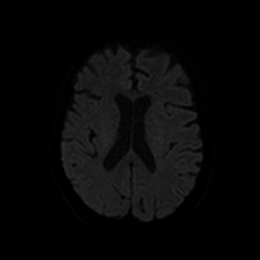
[im 77/96]
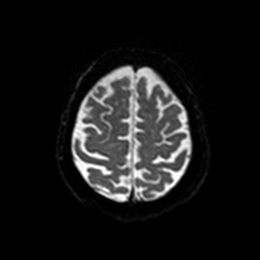
[im 96/96]
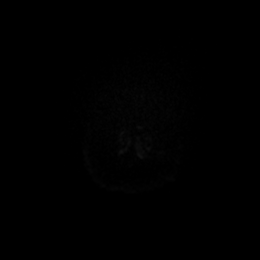

[Series 6: DWI · axial · 3.0mm · 0.88mm/px · z∈[-123,+17]mm · 4 of 48 slices shown (2 of 4)]
[im 1/48]
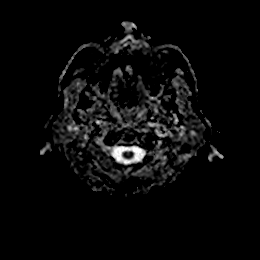
[im 16/48]
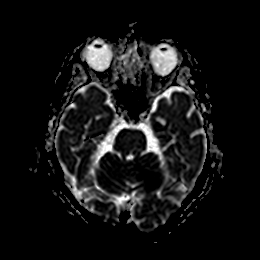
[im 32/48]
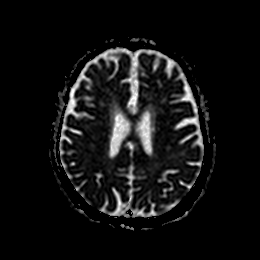
[im 48/48]
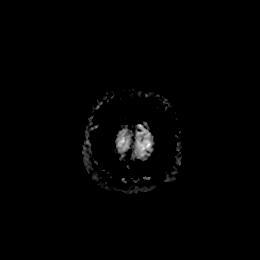

[Series 7: DWI · coronal · 4.0mm · 0.88mm/px · 5 of 66 slices shown (3 of 4)]
[im 1/66]
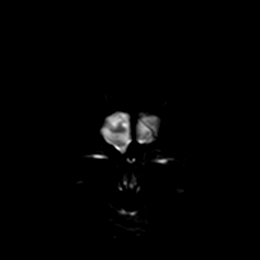
[im 17/66]
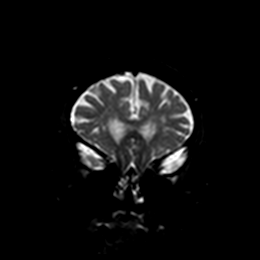
[im 33/66]
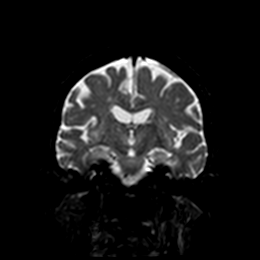
[im 49/66]
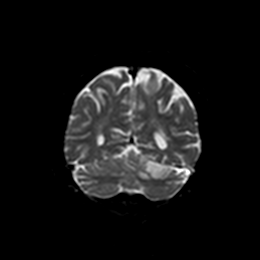
[im 66/66]
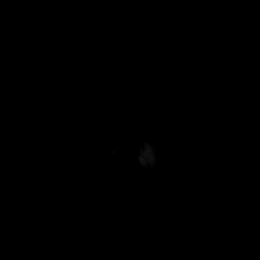

[Series 8: DWI · coronal · 4.0mm · 0.88mm/px · 2 of 33 slices shown (4 of 4)]
[im 1/33]
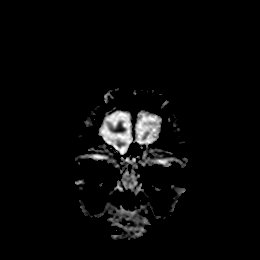
[im 33/33]
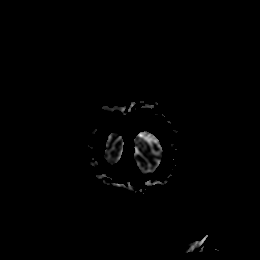

[Series 9: T1 · sagittal · 5.0mm · 0.75mm/px · 2 of 24 slices shown]
[im 1/24]
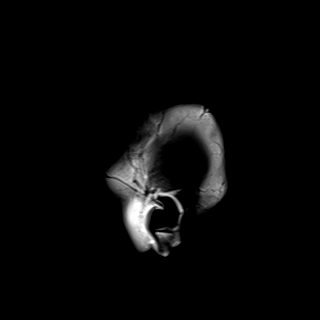
[im 24/24]
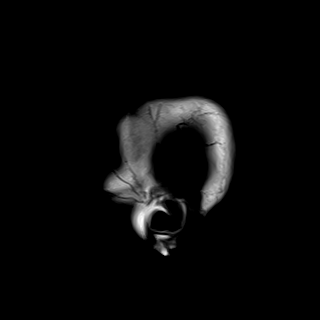

[Series 10: T2 · axial · 5.0mm · 0.72mm/px · z∈[-126,+17]mm · 2 of 25 slices shown (1 of 2)]
[im 1/25]
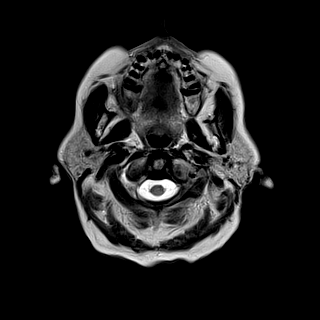
[im 25/25]
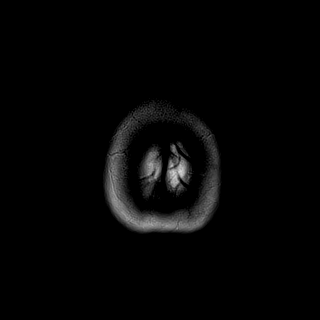

[Series 11: FLAIR · axial · 5.0mm · 0.45mm/px · z∈[-123,+20]mm · 2 of 25 slices shown]
[im 1/25]
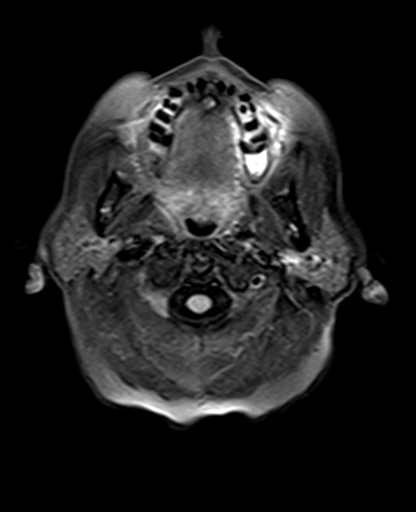
[im 25/25]
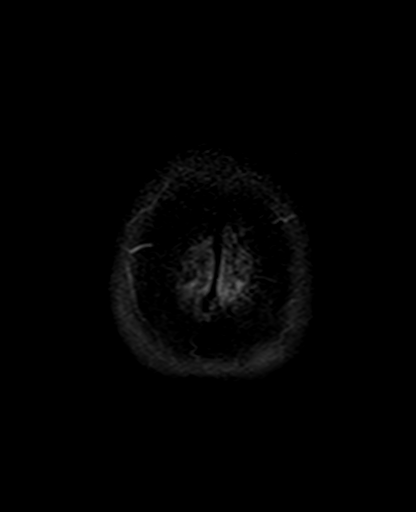

[Series 12: mag_images · axial · 3.0mm · 0.90mm/px · z∈[-132,+44]mm · 5 of 60 slices shown]
[im 1/60]
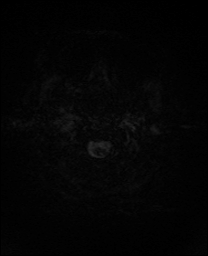
[im 15/60]
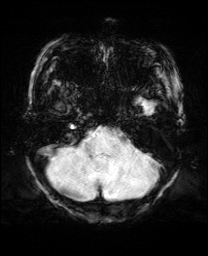
[im 30/60]
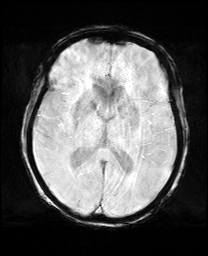
[im 45/60]
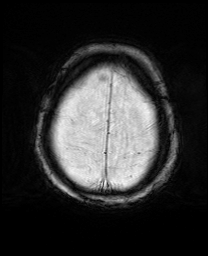
[im 60/60]
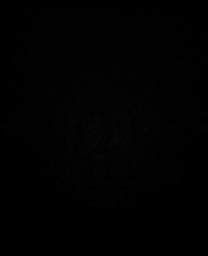

[Series 13: pha_images · axial · 3.0mm · 0.90mm/px · z∈[-132,+32]mm · 4 of 56 slices shown]
[im 1/56]
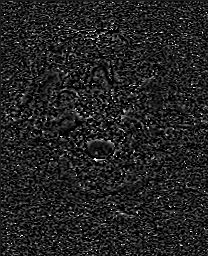
[im 19/56]
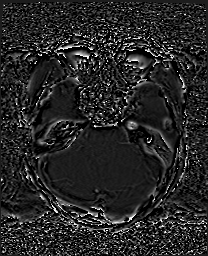
[im 37/56]
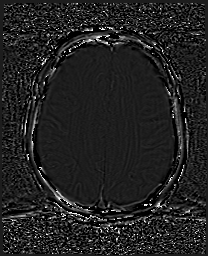
[im 56/56]
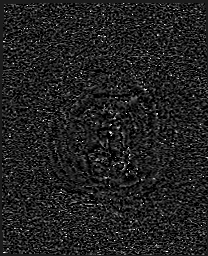

[Series 14: swi_images · axial · 3.0mm · 0.90mm/px · z∈[-132,+44]mm · 5 of 60 slices shown]
[im 1/60]
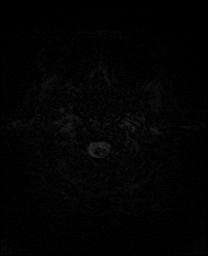
[im 15/60]
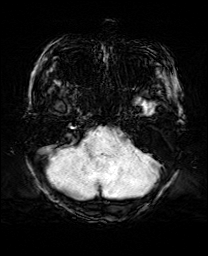
[im 30/60]
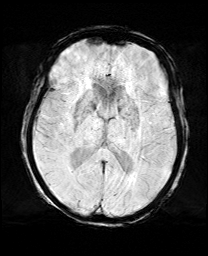
[im 45/60]
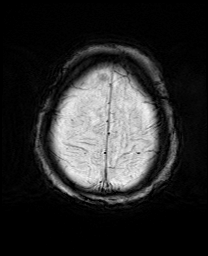
[im 60/60]
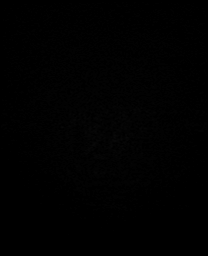

[Series 15: mip_images(sw) · axial · 24.0mm · 0.90mm/px · z∈[-122,+33]mm · 4 of 53 slices shown]
[im 1/53]
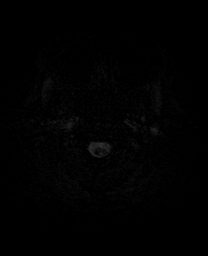
[im 18/53]
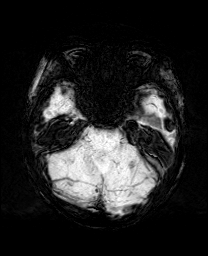
[im 35/53]
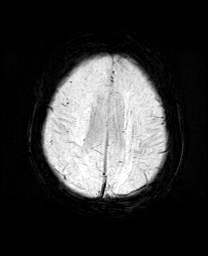
[im 53/53]
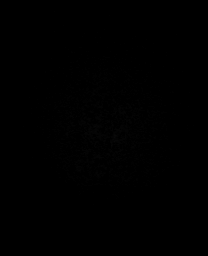

[Series 17: T2 · coronal · 5.0mm · 0.72mm/px · 2 of 28 slices shown (2 of 2)]
[im 1/28]
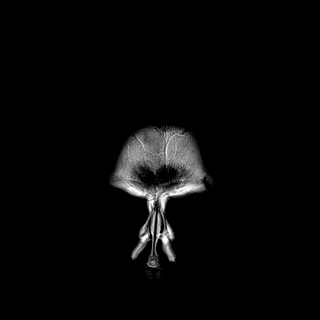
[im 28/28]
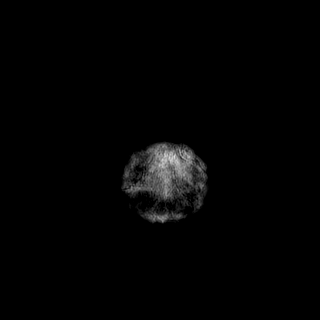

[43 of 48 positions shown; findings below may reference images not displayed]

FINDINGS: BRAIN: There is a large acute infarct within the superior left
cerebellar hemisphere. This extends into the left superior
cerebellar peduncle. There is no mass effect. Mild edema. There is
no acute hemorrhage. Multifocal white matter hyperintensity, most
commonly due to chronic ischemic microangiopathy. There is
generalized atrophy without lobar predilection. The midline
structures are normal.

VASCULAR: The major intracranial arterial and venous sinus flow
voids are normal. Medial left cerebellar focus of chronic
microhemorrhage.

SKULL AND UPPER CERVICAL SPINE: Calvarial bone marrow signal is
normal. There is no skull base mass. The visualized upper cervical
spine and soft tissues are normal.

SINUSES/ORBITS: There are no fluid levels or advanced mucosal
thickening. The mastoid air cells and middle ear cavities are free
of fluid. The orbits are normal.
IMPRESSION: 1. Large acute left cerebellar infarct without hemorrhage or mass
effect.
2. Moderate chronic small vessel ischemic microangiopathy.

## 2018-11-20 SURGERY — ECHOCARDIOGRAM, TRANSESOPHAGEAL
Anesthesia: Monitor Anesthesia Care

## 2018-11-20 MED ORDER — PROPOFOL 500 MG/50ML IV EMUL
INTRAVENOUS | Status: DC | PRN
  Administered 2018-11-20: 50 ug/kg/min via INTRAVENOUS

## 2018-11-20 MED ORDER — IOHEXOL 350 MG/ML SOLN
75.0000 mL | Freq: Once | INTRAVENOUS | Status: AC | PRN
  Administered 2018-11-20: 02:00:00 75 mL via INTRAVENOUS

## 2018-11-20 MED ORDER — PROPOFOL 10 MG/ML IV BOLUS
INTRAVENOUS | Status: DC | PRN
  Administered 2018-11-20: 20 mg via INTRAVENOUS
  Administered 2018-11-20: 10 mg via INTRAVENOUS

## 2018-11-20 MED ORDER — SODIUM CHLORIDE 0.9 % IV SOLN
2.0000 g | INTRAVENOUS | Status: DC
  Administered 2018-11-20 – 2018-11-22 (×12): 2 g via INTRAVENOUS
  Filled 2018-11-20 (×18): qty 2000

## 2018-11-20 MED ORDER — SODIUM CHLORIDE 0.9 % IV SOLN
INTRAVENOUS | Status: DC | PRN
  Administered 2018-11-20: 13:00:00 via INTRAVENOUS

## 2018-11-20 NOTE — CV Procedure (Signed)
     Transesophageal Echocardiogram Note  Autumn [name] [id number] 10-Mar-1932  Procedure: Transesophageal Echocardiogram Indications: MRSA bacteremia  Procedure Details Consent: Obtained Time Out: Verified patient identification, verified procedure, site/side was marked, verified correct patient position, special equipment/implants available, Radiology Safety Procedures followed,  medications/allergies/relevent history reviewed, required imaging and test results available.  Performed  Medications: IV propofol administered by anesthesia staff  1. Left ventricular ejection fraction, by visual estimation, is 55 to 60%. The left ventricle has normal function. Normal left ventricular size. There is no left ventricular hypertrophy.  2. Global right ventricle has normal systolic function.The right ventricular size is normal. No increase in right ventricular wall thickness.  3. Left atrial size was normal.  4. Right atrial size was normal.  5. Moderate thickening of the mitral valve leaflet(s).  6. Small vegetation on the mitral valve.  7. The mitral valve is normal in structure. Mild mitral valve regurgitation. No evidence of mitral stenosis.  8. The tricuspid valve is normal in structure. Tricuspid valve regurgitation moderate.  9. The aortic valve is normal in structure. Aortic valve regurgitation is trivial by color flow Doppler. Mild to moderate aortic valve sclerosis/calcification without any evidence of aortic stenosis. 10. The pulmonic valve was normal in structure. Pulmonic valve regurgitation is not visualized by color flow Doppler. 11. Normal pulmonary artery systolic pressure. 12. The inferior vena cava is normal in size with greater than 50% respiratory variability, suggesting right atrial pressure of 3 mmHg. 13. There is a very small mobile echodensity attached to the posterior mitral valve leaflet measuring 3.5 x 2 mm. While this might represent a degenerative process of the  leaflet, in the settings of bacteremia it is highly suspicious of a vegetation.  Conservative therapy with antibiotics is recommended.  Complications: No apparent complications Patient did tolerate procedure well.  Ena Dawley, MD, [facility] 11/20/2018, 1:46 PM

## 2018-11-20 NOTE — Progress Notes (Signed)
MRI completed. Images reviewed. A subacute large to medium-sized left cerebellar ischemic infarction is seen. There is no significant mass effect. No current indication for hypertonic saline.   Electronically signed: Dr. Adell Koval    

## 2018-11-20 NOTE — Anesthesia Procedure Notes (Signed)
Procedure Name: MAC Date/Time: 11/20/2018 12:53 PM Performed by: Neldon Newport, CRNA Pre-anesthesia Checklist: Timeout performed, Patient being monitored, Suction available, Emergency Drugs available and Patient identified Patient Re-evaluated:Patient Re-evaluated prior to induction Oxygen Delivery Method: Nasal cannula

## 2018-11-20 NOTE — Progress Notes (Signed)
Echocardiogram Echocardiogram Transesophageal has been performed.  Oneal Deputy Brehanna Deveny 11/20/2018, 1:20 PM

## 2018-11-20 NOTE — Progress Notes (Addendum)
Subjective: Patient reports back pain and left hand wrist pain  Objective: Vital signs in last 24 hours: Temp:  [97.4 F (36.3 C)-98.8 F (37.1 C)] 98 F (36.7 C) (10/07 0751) Pulse Rate:  [87-107] 93 (10/07 0751) Resp:  [16] 16 (10/07 0532) BP: (117-162)/(45-77) 134/60 (10/07 0751) SpO2:  [92 %-98 %] 96 % (10/07 0751)  Intake/Output from previous day: 10/06 0701 - 10/07 0700 In: 540 [P.O.:340; IV Piggyback:200] Out: 350 [Urine:350] Intake/Output this shift: No intake/output data recorded.  Awake and alert, but not offering conversation. Answers with nods or brief phrases. Moves all extremities, up to bsc last night. Infarct on MRI is consistent with change in mentation and withdrawn affect.  Lab Results: Recent Labs    11/18/18 0531 11/20/18 0303  WBC 16.7* 12.3*  HGB 12.1 10.0*  HCT 35.1* 29.2*  PLT 238 180   BMET Recent Labs    11/18/18 0531 11/20/18 0303  NA 130* 127*  K 4.0 4.0  CL 95* 94*  CO2 23 22  GLUCOSE 133* 156*  BUN 18 22  CREATININE 0.94 0.85  CALCIUM 8.8* 8.5*    Studies/Results: Ct Angio Head W Or Wo Contrast  Result Date: 11/20/2018 CLINICAL DATA:  Stroke follow-up EXAM: CT ANGIOGRAPHY HEAD AND NECK TECHNIQUE: Multidetector CT imaging of the head and neck was performed using the standard protocol during bolus administration of intravenous contrast. Multiplanar CT image reconstructions and MIPs were obtained to evaluate the vascular anatomy. Carotid stenosis measurements (when applicable) are obtained utilizing NASCET criteria, using the distal internal carotid diameter as the denominator. CONTRAST:  31mL OMNIPAQUE IOHEXOL 350 MG/ML SOLN COMPARISON:  None. FINDINGS: CTA NECK FINDINGS SKELETON: There is no bony spinal canal stenosis. No lytic or blastic lesion. OTHER NECK: Normal pharynx, larynx and major salivary glands. No cervical lymphadenopathy. Unremarkable thyroid gland. UPPER CHEST: Biapical scarring AORTIC ARCH: There is mild calcific  atherosclerosis of the aortic arch. There is no aneurysm, dissection or hemodynamically significant stenosis of the visualized portion of the aorta. Conventional 3 vessel aortic branching pattern. There is approximately 50% stenosis of the proximal left subclavian artery, which is otherwise normal. Right subclavian artery visualized portion is normal. RIGHT CAROTID SYSTEM: No dissection, occlusion or aneurysm. Mild atherosclerotic calcification at the carotid bifurcation without hemodynamically significant stenosis. LEFT CAROTID SYSTEM: Normal without aneurysm, dissection or stenosis. VERTEBRAL ARTERIES: Left dominant configuration. Both origins are clearly patent. There is no dissection, occlusion or flow-limiting stenosis to the skull base (V1-V3 segments). CTA HEAD FINDINGS POSTERIOR CIRCULATION: --Vertebral arteries: Normal V4 segments. --Posterior inferior cerebellar arteries (PICA): Patent origins from the vertebral arteries. --Anterior inferior cerebellar arteries (AICA): Patent origins from the basilar artery. --Basilar artery: Diminutive basilar artery --Superior cerebellar arteries: Normal. --Posterior cerebral arteries: Both are predominantly supplied by the posterior communicating arteries (p-comm). Moderate stenosis of the proximal left P2 segment (series 11, image 20). ANTERIOR CIRCULATION: --Intracranial internal carotid arteries: Normal. --Anterior cerebral arteries (ACA): Normal. Both A1 segments are present. Patent anterior communicating artery (a-comm). --Middle cerebral arteries (MCA): Normal. VENOUS SINUSES: As permitted by contrast timing, patent. ANATOMIC VARIANTS: None Review of the MIP images confirms the above findings. IMPRESSION: 1. No emergent large vessel occlusion. 2. Moderate stenosis of the proximal left PCA P2 segment. 3. Unchanged appearance of left cerebellar infarct. 4. Aortic Atherosclerosis (ICD10-I70.0). Electronically Signed   By: Ulyses Jarred M.D.   On: 11/20/2018 02:27    Ct Head Wo Contrast  Result Date: 11/19/2018 CLINICAL DATA:  85 year old with acute encephalopathy. She has  Staph aureus sepsis/bacteremia. EXAM: CT HEAD WITHOUT CONTRAST TECHNIQUE: Contiguous axial images were obtained from the base of the skull through the vertex without intravenous contrast. COMPARISON:  None. FINDINGS: Brain: Ventricular system normal in size and appearance for age. Mild age-appropriate cortical atrophy. Geographic low attenuation involving the LEFT superior cerebellar hemisphere extending into the LEFT cerebellar peduncle. No associated hemorrhage. Mild changes of small vessel disease of the white matter diffusely. No acute hemorrhage or hematoma. No extra-axial fluid collections. Vascular: Moderate BILATERAL carotid siphon and LEFT vertebral artery atherosclerosis. No hyperdense vessel. Skull: No skull fracture or other focal osseous abnormality involving the skull. Sinuses/Orbits: Visualized paranasal sinuses, bilateral mastoid air cells and bilateral middle ear cavities well-aerated. Visualized orbits and globes normal in appearance. Other: None. IMPRESSION: 1. Acute/subacute nonhemorrhagic stroke involving the LEFT superior cerebellar hemisphere extending into the LEFT cerebellar peduncle. 2. Mild age-appropriate cortical atrophy and mild chronic microvascular ischemic changes of the white matter. These results will be called to the ordering clinician or representative by the Radiologist Assistant, and communication documented in the PACS or zVision Dashboard. Electronically Signed   By: Hulan Saashomas  Lawrence M.D.   On: 11/19/2018 14:50   Ct Angio Neck W Or Wo Contrast  Result Date: 11/20/2018 CLINICAL DATA:  Stroke follow-up EXAM: CT ANGIOGRAPHY HEAD AND NECK TECHNIQUE: Multidetector CT imaging of the head and neck was performed using the standard protocol during bolus administration of intravenous contrast. Multiplanar CT image reconstructions and MIPs were obtained to evaluate the  vascular anatomy. Carotid stenosis measurements (when applicable) are obtained utilizing NASCET criteria, using the distal internal carotid diameter as the denominator. CONTRAST:  75mL OMNIPAQUE IOHEXOL 350 MG/ML SOLN COMPARISON:  None. FINDINGS: CTA NECK FINDINGS SKELETON: There is no bony spinal canal stenosis. No lytic or blastic lesion. OTHER NECK: Normal pharynx, larynx and major salivary glands. No cervical lymphadenopathy. Unremarkable thyroid gland. UPPER CHEST: Biapical scarring AORTIC ARCH: There is mild calcific atherosclerosis of the aortic arch. There is no aneurysm, dissection or hemodynamically significant stenosis of the visualized portion of the aorta. Conventional 3 vessel aortic branching pattern. There is approximately 50% stenosis of the proximal left subclavian artery, which is otherwise normal. Right subclavian artery visualized portion is normal. RIGHT CAROTID SYSTEM: No dissection, occlusion or aneurysm. Mild atherosclerotic calcification at the carotid bifurcation without hemodynamically significant stenosis. LEFT CAROTID SYSTEM: Normal without aneurysm, dissection or stenosis. VERTEBRAL ARTERIES: Left dominant configuration. Both origins are clearly patent. There is no dissection, occlusion or flow-limiting stenosis to the skull base (V1-V3 segments). CTA HEAD FINDINGS POSTERIOR CIRCULATION: --Vertebral arteries: Normal V4 segments. --Posterior inferior cerebellar arteries (PICA): Patent origins from the vertebral arteries. --Anterior inferior cerebellar arteries (AICA): Patent origins from the basilar artery. --Basilar artery: Diminutive basilar artery --Superior cerebellar arteries: Normal. --Posterior cerebral arteries: Both are predominantly supplied by the posterior communicating arteries (p-comm). Moderate stenosis of the proximal left P2 segment (series 11, image 20). ANTERIOR CIRCULATION: --Intracranial internal carotid arteries: Normal. --Anterior cerebral arteries (ACA): Normal.  Both A1 segments are present. Patent anterior communicating artery (a-comm). --Middle cerebral arteries (MCA): Normal. VENOUS SINUSES: As permitted by contrast timing, patent. ANATOMIC VARIANTS: None Review of the MIP images confirms the above findings. IMPRESSION: 1. No emergent large vessel occlusion. 2. Moderate stenosis of the proximal left PCA P2 segment. 3. Unchanged appearance of left cerebellar infarct. 4. Aortic Atherosclerosis (ICD10-I70.0). Electronically Signed   By: Deatra RobinsonKevin  Herman M.D.   On: 11/20/2018 02:27   Mr Brain Wo Contrast  Result Date: 11/20/2018 CLINICAL DATA:  Stroke follow-up EXAM: MRI HEAD WITHOUT CONTRAST TECHNIQUE: Multiplanar, multiecho pulse sequences of the brain and surrounding structures were obtained without intravenous contrast. COMPARISON:  None. FINDINGS: BRAIN: There is a large acute infarct within the superior left cerebellar hemisphere. This extends into the left superior cerebellar peduncle. There is no mass effect. Mild edema. There is no acute hemorrhage. Multifocal white matter hyperintensity, most commonly due to chronic ischemic microangiopathy. There is generalized atrophy without lobar predilection. The midline structures are normal. VASCULAR: The major intracranial arterial and venous sinus flow voids are normal. Medial left cerebellar focus of chronic microhemorrhage. SKULL AND UPPER CERVICAL SPINE: Calvarial bone marrow signal is normal. There is no skull base mass. The visualized upper cervical spine and soft tissues are normal. SINUSES/ORBITS: There are no fluid levels or advanced mucosal thickening. The mastoid air cells and middle ear cavities are free of fluid. The orbits are normal. IMPRESSION: 1. Large acute left cerebellar infarct without hemorrhage or mass effect. 2. Moderate chronic small vessel ischemic microangiopathy. Electronically Signed   By: Deatra RobinsonKevin  Herman M.D.   On: 11/20/2018 03:12   Mr Thoracic Spine W Wo Contrast  Result Date:  11/18/2018 CLINICAL DATA:  Back pain EXAM: MRI THORACIC WITHOUT AND WITH CONTRAST TECHNIQUE: Multiplanar and multiecho pulse sequences of the thoracic spine were obtained without and with intravenous contrast. CONTRAST:  6mL GADAVIST GADOBUTROL 1 MMOL/ML IV SOLN COMPARISON:  None. FINDINGS: MRI THORACIC SPINE FINDINGS Alignment:  Physiologic. Vertebrae: No fracture, evidence of discitis, or bone lesion. Cord:  Normal signal and morphology. Paraspinal and other soft tissues: Small pleural effusions Disc levels: At T7-8, there is an intermediate sized central disc protrusion that effaces the ventral thecal sac and contacts the anterior surface of the spinal cord. There is no spinal canal stenosis. At T8-9, there is a small right subarticular disc protrusion without stenosis. At T9-10, there is a small disc osteophyte complex, greatest in the right subarticular zone, without spinal canal stenosis. At T11-12, there is a left subarticular disc protrusion without causing stenosis. IMPRESSION: 1. No discitis-osteomyelitis or epidural abscess. 2. 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 3. Central disc protrusion at T7-8 contacting the ventral surface of the spinal cord. Electronically Signed   By: Deatra RobinsonKevin  Herman M.D.   On: 11/18/2018 21:51   Mr Lumbar Spine W Wo Contrast  Result Date: 11/18/2018 CLINICAL DATA:  Back pain. Nausea and vomiting. EXAM: MRI LUMBAR SPINE WITHOUT AND WITH CONTRAST TECHNIQUE: Multiplanar and multiecho pulse sequences of the lumbar spine were obtained without and with intravenous contrast. CONTRAST:  6mL GADAVIST GADOBUTROL 1 MMOL/ML IV SOLN COMPARISON:  None. FINDINGS: Segmentation:  Normal Alignment:  Dextroscoliosis with apex at L3 Vertebrae: No acute compression fracture, discitis-osteomyelitis, facet edema or other focal marrow lesion. No epidural collection. Conus medullaris and cauda equina: Conus extends to the L2 level. Conus and cauda equina appear normal.  Paraspinal and other soft tissues: Negative. Disc levels: T12-L1: Normal disc space and facets. No spinal canal or neuroforaminal stenosis. L1-L2: Disc space narrowing with endplate spurring. No spinal canal stenosis. Severe right neural foraminal stenosis. L2-L3: Disc space narrowing with endplate spurring. Moderate bilateral neural foraminal stenosis. Mild facet hypertrophy. No central spinal canal stenosis. L3-L4: Disc space narrowing with diffuse bulge. Moderate facet hypertrophy. Mild spinal canal stenosis with severe narrowing of the left lateral recess. Mild right and severe left neural foraminal stenosis. L4-L5: Small disc bulge with mild facet hypertrophy. No spinal canal stenosis. Mild right and severe left neural foraminal  stenosis. L5-S1: Small disc bulge with severe right facet hypertrophy. There is severe right neural foraminal stenosis and narrowing of the right lateral recess. No central spinal canal or left neural foraminal stenosis. Visualized sacrum: Normal. IMPRESSION: 1. No epidural abscess or discitis-osteomyelitis. 2. 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 3. Multilevel moderate-to-severe neural foraminal stenosis. Electronically Signed   By: Deatra Robinson M.D.   On: 11/18/2018 21:44   Mr Sacrum Si Joints W Wo Contrast  Result Date: 11/18/2018 CLINICAL DATA:  Back pain.  Possible infection. EXAM: MRI PELVIS WITHOUT AND WITH CONTRAST TECHNIQUE: Multiplanar multisequence MR imaging of the pelvis was performed both before and after administration of intravenous contrast. CONTRAST:  61mL GADAVIST GADOBUTROL 1 MMOL/ML IV SOLN COMPARISON:  CT scan from yesterday. FINDINGS: The bony structures are intact. No stress fracture or bone lesion. No findings suggests sacroiliitis or septic arthritis. Mild inflammatory changes involving the right piriformis muscle and also the upper aspect of the right iliopsoas muscle. This could reflect  nonspecific myositis. Both hips are normally located. Small joint effusions but no findings suspicious for septic arthritis. No evidence of avascular necrosis. No significant intrapelvic abnormalities are identified. IMPRESSION: 1. No findings suspicious for septic arthritis or osteomyelitis involving the sacrum or SI joints or hip joints. 2. Areas of nonspecific myositis mainly involving the right piriformis muscle and the right iliopsoas muscle. 3. No significant intrapelvic abnormalities. Electronically Signed   By: Rudie Meyer M.D.   On: 11/18/2018 21:36   Vas [place] Upper Extremity Venous Duplex  Result Date: 11/20/2018 UPPER VENOUS STUDY  Indications: shoulder pain Comparison Study: no prior Performing Technologist: Jeb Levering RDMS, RVT  Examination Guidelines: A complete evaluation includes B-mode imaging, spectral Doppler, color Doppler, and power Doppler as needed of all accessible portions of each vessel. Bilateral testing is considered an integral part of a complete examination. Limited examinations for reoccurring indications may be performed as noted.  Left Findings: +----------+------------+---------+-----------+----------+-------+ LEFT      CompressiblePhasicitySpontaneousPropertiesSummary +----------+------------+---------+-----------+----------+-------+ IJV           Full       Yes       Yes                      +----------+------------+---------+-----------+----------+-------+ Subclavian    Full       Yes       Yes                      +----------+------------+---------+-----------+----------+-------+ Axillary      Full       Yes       Yes                      +----------+------------+---------+-----------+----------+-------+ Brachial      Full       Yes       Yes                      +----------+------------+---------+-----------+----------+-------+ Radial        Full                                           +----------+------------+---------+-----------+----------+-------+ Ulnar         Full                                          +----------+------------+---------+-----------+----------+-------+  Cephalic      None                                   Acute  +----------+------------+---------+-----------+----------+-------+ Basilic       Full                                          +----------+------------+---------+-----------+----------+-------+  Summary:  Left: No evidence of deep vein thrombosis in the upper extremity. Findings consistent with acute superficial vein thrombosis involving the left cephalic vein.  *See table(s) above for measurements and observations.  Diagnosing physician: Coral Else MD Electronically signed by Coral Else MD on 11/20/2018 at 6:59:46 AM.    Final     Assessment/Plan:   LOS: 7 days  Continue supportive care. Mobilize with therapies.   Georgiann Cocker 11/20/2018, 8:08 AM   As suspected, CVA is basis for cognitive impairment.  Further workup per Medicine Service.

## 2018-11-20 NOTE — Progress Notes (Signed)
Dr. Cheral Marker ( Neurology on call) and Bodenheimer (NP on call) made aware the result of MRI brain. No new orders given. Will continue to monitor pt.

## 2018-11-20 NOTE — Transfer of Care (Signed)
Immediate Anesthesia Transfer of Care Note  Patient: Autumn [name]  Procedure(s) Performed: TRANSESOPHAGEAL ECHOCARDIOGRAM (TEE) (N/A )  Patient Location: Endoscopy Unit  Anesthesia Type:MAC  Level of Consciousness: sedated  Airway & Oxygen Therapy: Patient Spontanous Breathing and Patient connected to nasal cannula oxygen  Post-op Assessment: Report given to RN, Post -op Vital signs reviewed and stable and Patient moving all extremities X 4  Post vital signs: Reviewed and stable  Last Vitals:  Vitals Value Taken Time  BP    Temp    Pulse    Resp    SpO2      Last Pain:  Vitals:   11/20/18 1145  TempSrc: Oral  PainSc: 8       Patients Stated Pain Goal: 0 (27/25/36 6440)  Complications: No apparent anesthesia complications

## 2018-11-20 NOTE — Evaluation (Signed)
Occupational Therapy Evaluation Patient Details Name: Autumn [name] MRN: [id number] DOB: 03-14-1932 Today's Date: [name]    History of Present Illness 85 y.o. female with past medical history significant for HTN, LBBB, and GERD who presents to the ED via EMS from [facility] for acute onset neck and back pain as well as vomiting. In ED became orthostatic with 3 minutes of standing and found to have elevated troponing levels. Admitted for observation 9/29.   Clinical Impression   This 86 y/o female presents with the above. PTA pt reports independence with ADL and functional mobility. Pt presenting today with significant weakness and pain in LUE/back, impaired cognition all impacting her functional performance. Pt grimacing and reports pain in LUE with any attempts at PROM. Pt with no attempt to engage/assist with bed mobility today requiring totalA for attempts. She was able to perform simple grooming ADL using RUE at bed level with setup assist. Currently requiring max-totalA for remaining ADL tasks. She will benefit from continued acute Autumn services and recommend follow up therapy services at SNF level after discharge to progress pt towards her PLOF. Will follow.     Follow Up Recommendations  SNF;Supervision/Assistance - 24 hour    Equipment Recommendations  3 in 1 bedside commode;Other (comment)(TBD in next venue)           Precautions / Restrictions Precautions Precautions: Fall Precaution Comments: severe back pain Restrictions Weight Bearing Restrictions: No      Mobility Bed Mobility Overal bed mobility: Needs Assistance Bed Mobility: Rolling Rolling: Total assist         General bed mobility comments: pt with no attempt to assist with bed mobility today, totalA   Transfers                 General transfer comment: deferred    Balance Overall balance assessment: Needs assistance                                          ADL either performed or assessed with clinical judgement   ADL Overall ADL's : Needs assistance/impaired Eating/Feeding: Set up;Minimal assistance;Bed level   Grooming: Wash/dry face;Set up;Bed level                                 General ADL Comments: max-totalA for all other ADL at this time; pt with impaired cognition, lethargy, and pain this AM limiting her ability to participate in ADL                  Pertinent Vitals/Pain Pain Assessment: Faces Faces Pain Scale: Hurts worst Pain Location: low back and point tenderness over L shoulder Pain Descriptors / Indicators: Pressure;Constant;Crying;Grimacing Pain Intervention(s): Limited activity within patient's tolerance;Monitored during session;Repositioned     Hand Dominance Right   Extremity/Trunk Assessment Upper Extremity Assessment Upper Extremity Assessment: LUE deficits/detail;Generalized weakness LUE Deficits / Details: painful LUE and pt grimacing with any attempts at PROM, pt able to flex/extend digits given increased tim/effort; L wrist warm to touch and appears bruised  LUE: Unable to fully assess due to pain LUE Coordination: decreased fine motor;decreased gross motor   Lower Extremity Assessment Lower Extremity Assessment: Defer to PT evaluation       Communication Communication Communication: No difficulties   Cognition Arousal/Alertness: Awake/alert Behavior During Therapy: Flat affect  Overall Cognitive Status: Impaired/Different from baseline Area of Impairment: Problem solving;Following commands;Safety/judgement;Orientation                 Orientation Level: Disoriented to;Situation;Time     Following Commands: Follows one step commands with increased time;Follows one step commands inconsistently Safety/Judgement: Decreased awareness of safety   Problem Solving: Slow processing;Requires verbal cues;Requires tactile cues;Decreased initiation General Comments: significant  delay with processing/following commands. pt tending to maintain eyes closed requiring cues to arouse; soft spoken and requires encouragement to engaged with therapist    General Comments  VSS    Exercises     Shoulder Instructions      Home Living Family/patient expects to be discharged to:: Skilled nursing facility Living Arrangements: Alone Available Help at Discharge: Available PRN/intermittently Type of Home: Apartment Home Access: [place]: One level     Bathroom Shower/Tub: Occupational psychologist: Handicapped height     Home Equipment: Grab bars - tub/shower;Grab bars - toilet(walking stick)   Additional Comments: [facility] independent living       Prior Functioning/Environment Level of Independence: Independent        Comments: reports occasional use of walking stick in public, independent in ADLs and iADLs, drives        Autumn Problem List: Decreased strength;Decreased range of motion;Decreased activity tolerance;Impaired balance (sitting and/or standing);Decreased knowledge of use of DME or AE;Pain;Impaired UE functional use;Decreased cognition;Decreased coordination      Autumn Treatment/Interventions: Self-care/ADL training;Therapeutic exercise;Energy conservation;DME and/or AE instruction;Therapeutic activities;Patient/family education;Balance training;Cognitive remediation/compensation    Autumn Goals(Current goals can be found in the care plan section) Acute Rehab Autumn Goals Patient Stated Goal: to go to STR before going home Autumn Goal Formulation: With patient Time For Goal Achievement: 12/04/18 Potential to Achieve Goals: Good  Autumn Frequency: Min 2X/week   Barriers to D/C:            Co-evaluation              AM-PAC Autumn "6 Clicks" Daily Activity     Outcome Measure Help from another person eating meals?: A Lot Help from another person taking care of personal grooming?: A Lot Help from another person toileting, which  includes using toliet, bedpan, or urinal?: Total Help from another person bathing (including washing, rinsing, drying)?: Total Help from another person to put on and taking off regular upper body clothing?: Total Help from another person to put on and taking off regular lower body clothing?: Total 6 Click Score: 8   End of Session Nurse Communication: Mobility status  Activity Tolerance: Patient limited by lethargy;Patient limited by pain Patient left: in bed;with call bell/phone within reach  Autumn Visit Diagnosis: Other abnormalities of gait and mobility (R26.89);Pain;Muscle weakness (generalized) (M62.81);Other symptoms and signs involving cognitive function Pain - Right/Left: Left Pain - part of body: Shoulder;Arm(and back)                Time: 4332-9518 Autumn Time Calculation (min): 18 min Charges:  Autumn General Charges $Autumn Visit: 1 Visit Autumn Evaluation $Autumn Eval Moderate Complexity: Autumn [name], Autumn [name] Pager [phone] Office [phone]   Autumn [name], 1:16 PM

## 2018-11-20 NOTE — Interval H&P Note (Signed)
History and Physical Interval Note:  11/20/2018 10:09 AM  Autumn [name]  has presented today for surgery, with the diagnosis of BACTEREMIA.  The various methods of treatment have been discussed with the patient and family. After consideration of risks, benefits and other options for treatment, the patient has consented to  Procedure(s): TRANSESOPHAGEAL ECHOCARDIOGRAM (TEE) (N/A) as a surgical intervention.  The patient's history has been reviewed, patient examined, no change in status, stable for surgery.  I have reviewed the patient's chart and labs.  Questions were answered to the patient's satisfaction.     Ena Dawley

## 2018-11-20 NOTE — Consult Note (Signed)
Subjective: No new complaints   Antibiotics:  Anti-infectives (From admission, onward)   Start     Dose/Rate Route Frequency Ordered Stop   11/20/18 1115  vancomycin (VANCOCIN) IVPB 750 mg/150 ml premix  Status:  Discontinued     750 mg 150 mL/hr over 60 Minutes Intravenous Every 24 hours 11/19/18 1057 11/20/18 1010   11/20/18 1100  nafcillin 2 g in sodium chloride 0.9 % 100 mL IVPB     2 g 200 mL/hr over 30 Minutes Intravenous Every 4 hours 11/20/18 1010     11/19/18 1115  vancomycin (VANCOCIN) 1,750 mg in sodium chloride 0.9 % 500 mL IVPB     1,750 mg 250 mL/hr over 120 Minutes Intravenous  Once 11/19/18 1042 11/19/18 1419      Medications: Scheduled Meds:  aspirin EC  325 mg Oral Daily   atorvastatin  20 mg Oral q1800   carvedilol  12.5 mg Oral BID   feeding supplement (ENSURE ENLIVE)  237 mL Oral BID BM   influenza vaccine adjuvanted  0.5 mL Intramuscular Tomorrow-1000   levothyroxine  50 mcg Oral QAC breakfast   lidocaine  1 patch Transdermal Q24H   losartan  100 mg Oral Daily   pantoprazole  40 mg Oral BID   senna-docusate  2 tablet Oral BID   sodium chloride flush  3 mL Intravenous Q12H   vitamin B-12  1,000 mcg Oral Daily   Continuous Infusions:  methocarbamol (ROBAXIN) IV 500 mg (11/17/18 2004)   nafcillin IV 2 g (11/20/18 1401)   PRN Meds:.acetaminophen **OR** acetaminophen, albuterol, alum & mag hydroxide-simeth, diclofenac sodium, HYDROcodone-acetaminophen, labetalol, LORazepam, methocarbamol (ROBAXIN) IV, naLOXone (NARCAN)  injection, traMADol  Objective:  Intake/Output Summary (Last 24 hours) at 11/20/2018 1427 Last data filed at 11/20/2018 1313 Gross per 24 hour  Intake 950.31 ml  Output 650 ml  Net 300.31 ml   Blood pressure (!) 123/36, pulse 74, temperature 98.2 F (36.8 C), temperature source Oral, resp. rate 17, height _0  (1.6 m), weight 68 kg, SpO2 95 %. Temp:  [97.4 F (36.3 C)-98.8 F (37.1 C)] 98.2 F (36.8 C)  (10/07 1145) Pulse Rate:  [74-107] 74 (10/07 1330) Resp:  [16-18] 17 (10/07 1330) BP: (117-162)/(36-77) 123/36 (10/07 1330) SpO2:  [92 %-100 %] 95 % (10/07 1318)  Physical Exam: General: Alert and awake, oriented x3, not in any acute distress. Speaks softly in short answers. HEENT: anicteric sclera, EOMI CVS regular rate, normal  Chest: , no wheezing, no respiratory distress Abdomen: soft non-distended,  Extremities: no edema or deformity noted bilaterally Skin: no rashes Neuro: nonfocal  CBC: CBC Latest Ref Rng & Units 11/20/2018 11/18/2018 11/17/2018  WBC 4.0 - 10.5 K/uL 12.3(H) 16.7(H) 13.0(H)  Hemoglobin 12.0 - 15.0 g/dL 10.0(L) 12.1 13.5  Hematocrit 36.0 - 46.0 % 29.2(L) 35.1(L) 38.9  Platelets 150 - 400 K/uL 180 238 295    BMET Recent Labs    11/18/18 0531 11/20/18 0303  NA 130* 127*  K 4.0 4.0  CL 95* 94*  CO2 23 22  GLUCOSE 133* 156*  BUN 18 22  CREATININE 0.94 0.85  CALCIUM 8.8* 8.5*    Liver Panel  Recent Labs    11/17/18 1750 11/18/18 0531  PROT 7.1 6.2*  ALBUMIN 3.3* 2.7*  AST 32 37  ALT 25 23  ALKPHOS 50 44  BILITOT 0.8 0.3    Sedimentation Rate Recent Labs    11/20/18 0303  ESRSEDRATE 95*   C-Reactive Protein  Recent Labs    11/20/18 0303  CRP 37.4*    Micro Results: Recent Results (from the past 720 hour(s))  Culture, blood (routine x 2)     Status: Abnormal   Collection Time: 11/12/18  2:10 AM   Specimen: BLOOD  Result Value Ref Range Status   Specimen Description   Final    BLOOD RIGHT ANTECUBITAL Performed at [facility], [address] [place], [place] [address]    Special Requests   Final    BOTTLES DRAWN AEROBIC AND ANAEROBIC Blood Culture adequate volume Performed at [facility], [address] [place], [place] [address]    Culture  Setup Time   Final    GRAM POSITIVE COCCI IN BOTH AEROBIC AND ANAEROBIC BOTTLES CRITICAL RESULT CALLED TO, READ BACK BY AND VERIFIED WITH: PHARMD T BAUMEISTER  [id number] MLM    Culture (A)  Final    STAPHYLOCOCCUS SPECIES (COAGULASE NEGATIVE) THE SIGNIFICANCE OF ISOLATING THIS ORGANISM FROM A SINGLE SET OF BLOOD CULTURES WHEN MULTIPLE SETS ARE DRAWN IS UNCERTAIN. PLEASE NOTIFY THE MICROBIOLOGY DEPARTMENT WITHIN ONE WEEK IF SPECIATION AND SENSITIVITIES ARE REQUIRED. Performed at [facility], [place] [address] [place], [place] [address]    Report Status 11/15/2018 FINAL  Final  Blood Culture ID Panel (Reflexed)     Status: Abnormal   Collection Time: 11/12/18  2:10 AM  Result Value Ref Range Status   Enterococcus species NOT DETECTED NOT DETECTED Final   Listeria monocytogenes NOT DETECTED NOT DETECTED Final   Staphylococcus species DETECTED (A) NOT DETECTED Final    Comment: Methicillin (oxacillin) susceptible coagulase negative staphylococcus. Possible blood culture contaminant (unless isolated from more than one blood culture draw or clinical case suggests pathogenicity). No antibiotic treatment is indicated for blood  culture contaminants. CRITICAL RESULT CALLED TO, READ BACK BY AND VERIFIED WITH: PHARMD [place] [id number] MLM    Staphylococcus aureus (BCID) NOT DETECTED NOT DETECTED Final   Methicillin resistance NOT DETECTED NOT DETECTED Final   Streptococcus species NOT DETECTED NOT DETECTED Final   Streptococcus agalactiae NOT DETECTED NOT DETECTED Final   Streptococcus pneumoniae NOT DETECTED NOT DETECTED Final   Streptococcus pyogenes NOT DETECTED NOT DETECTED Final   Acinetobacter baumannii NOT DETECTED NOT DETECTED Final   Enterobacteriaceae species NOT DETECTED NOT DETECTED Final   Enterobacter cloacae complex NOT DETECTED NOT DETECTED Final   Escherichia coli NOT DETECTED NOT DETECTED Final   Klebsiella oxytoca NOT DETECTED NOT DETECTED Final   Klebsiella pneumoniae NOT DETECTED NOT DETECTED Final   Proteus species NOT DETECTED NOT DETECTED Final   Serratia marcescens NOT DETECTED NOT DETECTED Final   Haemophilus  influenzae NOT DETECTED NOT DETECTED Final   Neisseria meningitidis NOT DETECTED NOT DETECTED Final   Pseudomonas aeruginosa NOT DETECTED NOT DETECTED Final   Candida albicans NOT DETECTED NOT DETECTED Final   Candida glabrata NOT DETECTED NOT DETECTED Final   Candida krusei NOT DETECTED NOT DETECTED Final   Candida parapsilosis NOT DETECTED NOT DETECTED Final   Candida tropicalis NOT DETECTED NOT DETECTED Final    Comment: Performed at [facility], [place]. [address] [place], [place] [address]  SARS CORONAVIRUS 2 (TAT 6-24 HRS) Nasopharyngeal Nasopharyngeal Swab     Status: None   Collection Time: 11/12/18  5:47 AM   Specimen: Nasopharyngeal Swab  Result Value Ref Range Status   SARS Coronavirus 2 NEGATIVE NEGATIVE Final    Comment: (NOTE) SARS-CoV-2 target nucleic acids are NOT DETECTED. The  SARS-CoV-2 RNA is generally detectable in upper and lower respiratory specimens during the acute phase of infection. Negative results do not preclude SARS-CoV-2 infection, do not rule out co-infections with other pathogens, and should not be used as the sole basis for treatment or other patient management decisions. Negative results must be combined with clinical observations, patient history, and epidemiological information. The expected result is Negative. Fact Sheet for Patients: SugarRoll.be Fact Sheet for Healthcare Providers: https://www.woods-mathews.com/ This test is not yet approved or cleared by the [place] FDA and  has been authorized for detection and/or diagnosis of SARS-CoV-2 by FDA under an Emergency Use Authorization (EUA). This EUA will remain  in effect (meaning this test can be used) for the duration of the COVID-19 declaration under Section 56 4(b)(1) of the Act, 21 U.S.C. section 360bbb-3(b)(1), unless the authorization is terminated or revoked sooner. Performed at [facility], [place] [address], [place],  [place] [address]   Culture, blood (routine x 2)     Status: None   Collection Time: 11/12/18  7:02 PM   Specimen: BLOOD LEFT HAND  Result Value Ref Range Status   Specimen Description BLOOD LEFT HAND  Final   Special Requests   Final    BOTTLES DRAWN AEROBIC ONLY Blood Culture adequate volume   Culture   Final    NO GROWTH 5 DAYS Performed at [facility], [place] [address], [place], [place] [address]    Report Status 11/17/2018 FINAL  Final  Novel Coronavirus, NAA (Hosp order, Send-out to Ref Lab; TAT 18-24 hrs     Status: None   Collection Time: 11/16/18  4:51 PM   Specimen: Nasopharyngeal Swab; Respiratory  Result Value Ref Range Status   SARS-CoV-2, NAA NOT DETECTED NOT DETECTED Final    Comment: (NOTE) This nucleic acid amplification test was developed and its performance characteristics determined by [employer]. Nucleic acid amplification tests include PCR and TMA. This test has not been FDA cleared or approved. This test has been authorized by FDA under an Emergency Use Authorization (EUA). This test is only authorized for the duration of time the declaration that circumstances exist justifying the authorization of the emergency use of in vitro diagnostic tests for detection of SARS-CoV-2 virus and/or diagnosis of COVID-19 infection under section 564(b)(1) of the Act, 21 U.S.C. 488QBV-6(X) (1), unless the authorization is terminated or revoked sooner. When diagnostic testing is negative, the possibility of a false negative result should be considered in the context of a patient's recent exposures and the presence of clinical signs and symptoms consistent with COVID-19. An individual without symptoms of COVID- 19 and who is not shedding SARS-CoV-2 vi rus would expect to have a negative (not detected) result in this assay. Performed At: [facility] [address] [place], [place] [id number] Rush Farmer MD MK:[id number]    [place]  Final    Comment: Performed at [facility], [place] [address], [place], [place] [address]  Culture, blood (routine x 2)     Status: Abnormal   Collection Time: 11/18/18  7:45 AM   Specimen: BLOOD LEFT ARM  Result Value Ref Range Status   Specimen Description BLOOD LEFT ARM  Final   Special Requests   Final    BOTTLES DRAWN AEROBIC ONLY Blood Culture results may not be optimal due to an inadequate volume of blood received in culture bottles   Culture  Setup Time   Final    AEROBIC BOTTLE ONLY  GRAM POSITIVE COCCI CRITICAL VALUE NOTED.  VALUE IS CONSISTENT WITH PREVIOUSLY REPORTED AND CALLED VALUE.    Culture (A)  Final    STAPHYLOCOCCUS AUREUS SUSCEPTIBILITIES PERFORMED ON PREVIOUS CULTURE WITHIN THE LAST 5 DAYS. Performed at [facility], [place] [address], [place], [place] [address]    Report Status 11/20/2018 FINAL  Final  Culture, blood (routine x 2)     Status: Abnormal   Collection Time: 11/18/18  7:51 AM   Specimen: BLOOD LEFT HAND  Result Value Ref Range Status   Specimen Description BLOOD LEFT HAND  Final   Special Requests   Final    BOTTLES DRAWN AEROBIC ONLY Blood Culture results may not be optimal due to an inadequate volume of blood received in culture bottles   Culture  Setup Time   Final    AEROBIC BOTTLE ONLY GRAM POSITIVE COCCI CRITICAL VALUE NOTED.  VALUE IS CONSISTENT WITH PREVIOUSLY REPORTED AND CALLED VALUE. Performed at [facility], [place] [address] [place], [place] [address]    Culture STAPHYLOCOCCUS AUREUS (A)  Final   Report Status 11/20/2018 FINAL  Final   Organism ID, Bacteria STAPHYLOCOCCUS AUREUS  Final      Susceptibility   Staphylococcus aureus - MIC*    CIPROFLOXACIN <=0.5 SENSITIVE Sensitive     ERYTHROMYCIN <=0.25 SENSITIVE Sensitive     GENTAMICIN <=0.5 SENSITIVE Sensitive     OXACILLIN <=0.25 SENSITIVE Sensitive     TETRACYCLINE <=1 SENSITIVE Sensitive     VANCOMYCIN <=0.5 SENSITIVE Sensitive     TRIMETH/SULFA  <=10 SENSITIVE Sensitive     CLINDAMYCIN <=0.25 SENSITIVE Sensitive     RIFAMPIN <=0.5 SENSITIVE Sensitive     Inducible Clindamycin NEGATIVE Sensitive     * STAPHYLOCOCCUS AUREUS  MRSA PCR Screening     Status: None   Collection Time: 11/19/18 10:48 AM   Specimen: Nasal Mucosa; Nasopharyngeal  Result Value Ref Range Status   MRSA by PCR NEGATIVE NEGATIVE Final    Comment:        The GeneXpert MRSA Assay (FDA approved for NASAL specimens only), is one component of a comprehensive MRSA colonization surveillance program. It is not intended to diagnose MRSA infection nor to guide or monitor treatment for MRSA infections. Performed at [facility], [place] [address], [place], [place] [address]   Culture, blood (routine x 2)     Status: Abnormal   Collection Time: 11/19/18 11:22 AM   Specimen: BLOOD  Result Value Ref Range Status   Specimen Description BLOOD RIGHT ANTECUBITAL  Final   Special Requests   Final    AEROBIC BOTTLE ONLY Blood Culture results may not be optimal due to an excessive volume of blood received in culture bottles   Culture  Setup Time   Final    AEROBIC BOTTLE ONLY GRAM POSITIVE COCCI CRITICAL VALUE NOTED.  VALUE IS CONSISTENT WITH PREVIOUSLY REPORTED AND CALLED VALUE.    Culture (A)  Final    STAPHYLOCOCCUS AUREUS SUSCEPTIBILITIES PERFORMED ON PREVIOUS CULTURE WITHIN THE LAST 5 DAYS. Performed at [facility], [place] [address] [place], [place] [address]    Report Status 11/20/2018 FINAL  Final  Culture, blood (routine x 2)     Status: Abnormal   Collection Time: 11/19/18 11:31 AM   Specimen: BLOOD RIGHT HAND  Result Value Ref Range Status   Specimen Description BLOOD RIGHT HAND  Final   Special Requests AEROBIC BOTTLE ONLY Blood Culture adequate volume  Final  Culture  Setup Time   Final    AEROBIC BOTTLE ONLY GRAM POSITIVE COCCI CRITICAL VALUE NOTED.  VALUE IS CONSISTENT WITH PREVIOUSLY REPORTED AND CALLED VALUE.    Culture (A)  Final     STAPHYLOCOCCUS AUREUS SUSCEPTIBILITIES PERFORMED ON PREVIOUS CULTURE WITHIN THE LAST 5 DAYS. Performed at [facility], [place] [address], [place], [place] [address]    Report Status 11/20/2018 FINAL  Final    Studies/Results: Ct Angio Head W Or Wo Contrast  Result Date: 11/20/2018 CLINICAL DATA:  Stroke follow-up EXAM: CT ANGIOGRAPHY HEAD AND NECK TECHNIQUE: Multidetector CT imaging of the head and neck was performed using the standard protocol during bolus administration of intravenous contrast. Multiplanar CT image reconstructions and MIPs were obtained to evaluate the vascular anatomy. Carotid stenosis measurements (when applicable) are obtained utilizing NASCET criteria, using the distal internal carotid diameter as the denominator. CONTRAST:  22m OMNIPAQUE IOHEXOL 350 MG/ML SOLN COMPARISON:  None. FINDINGS: CTA NECK FINDINGS SKELETON: There is no bony spinal canal stenosis. No lytic or blastic lesion. OTHER NECK: Normal pharynx, larynx and major salivary glands. No cervical lymphadenopathy. Unremarkable thyroid gland. UPPER CHEST: Biapical scarring AORTIC ARCH: There is mild calcific atherosclerosis of the aortic arch. There is no aneurysm, dissection or hemodynamically significant stenosis of the visualized portion of the aorta. Conventional 3 vessel aortic branching pattern. There is approximately 50% stenosis of the proximal left subclavian artery, which is otherwise normal. Right subclavian artery visualized portion is normal. RIGHT CAROTID SYSTEM: No dissection, occlusion or aneurysm. Mild atherosclerotic calcification at the carotid bifurcation without hemodynamically significant stenosis. LEFT CAROTID SYSTEM: Normal without aneurysm, dissection or stenosis. VERTEBRAL ARTERIES: Left dominant configuration. Both origins are clearly patent. There is no dissection, occlusion or flow-limiting stenosis to the skull base (V1-V3 segments). CTA HEAD FINDINGS POSTERIOR CIRCULATION: --Vertebral  arteries: Normal V4 segments. --Posterior inferior cerebellar arteries (PICA): Patent origins from the vertebral arteries. --Anterior inferior cerebellar arteries (AICA): Patent origins from the basilar artery. --Basilar artery: Diminutive basilar artery --Superior cerebellar arteries: Normal. --Posterior cerebral arteries: Both are predominantly supplied by the posterior communicating arteries (p-comm). Moderate stenosis of the proximal left P2 segment (series 11, image 20). ANTERIOR CIRCULATION: --Intracranial internal carotid arteries: Normal. --Anterior cerebral arteries (ACA): Normal. Both A1 segments are present. Patent anterior communicating artery (a-comm). --Middle cerebral arteries (MCA): Normal. VENOUS SINUSES: As permitted by contrast timing, patent. ANATOMIC VARIANTS: None Review of the MIP images confirms the above findings. IMPRESSION: 1. No emergent large vessel occlusion. 2. Moderate stenosis of the proximal left PCA P2 segment. 3. Unchanged appearance of left cerebellar infarct. 4. Aortic Atherosclerosis (ICD10-I70.0). Electronically Signed   By: KUlyses JarredM.D.   On: 11/20/2018 02:27   Ct Head Wo Contrast  Result Date: 11/19/2018 CLINICAL DATA:  85year old with acute encephalopathy. She has Staph aureus sepsis/bacteremia. EXAM: CT HEAD WITHOUT CONTRAST TECHNIQUE: Contiguous axial images were obtained from the base of the skull through the vertex without intravenous contrast. COMPARISON:  None. FINDINGS: Brain: Ventricular system normal in size and appearance for age. Mild age-appropriate cortical atrophy. Geographic low attenuation involving the LEFT superior cerebellar hemisphere extending into the LEFT cerebellar peduncle. No associated hemorrhage. Mild changes of small vessel disease of the white matter diffusely. No acute hemorrhage or hematoma. No extra-axial fluid collections. Vascular: Moderate BILATERAL carotid siphon and LEFT vertebral artery atherosclerosis. No hyperdense vessel.  Skull: No skull fracture or other focal osseous abnormality involving the skull. Sinuses/Orbits: Visualized paranasal sinuses, bilateral mastoid air cells and bilateral  middle ear cavities well-aerated. Visualized orbits and globes normal in appearance. Other: None. IMPRESSION: 1. Acute/subacute nonhemorrhagic stroke involving the LEFT superior cerebellar hemisphere extending into the LEFT cerebellar peduncle. 2. Mild age-appropriate cortical atrophy and mild chronic microvascular ischemic changes of the white matter. These results will be called to the ordering clinician or representative by the Radiologist Assistant, and communication documented in the PACS or zVision Dashboard. Electronically Signed   By: Evangeline Dakin M.D.   On: 11/19/2018 14:50   Ct Angio Neck W Or Wo Contrast  Result Date: 11/20/2018 CLINICAL DATA:  Stroke follow-up EXAM: CT ANGIOGRAPHY HEAD AND NECK TECHNIQUE: Multidetector CT imaging of the head and neck was performed using the standard protocol during bolus administration of intravenous contrast. Multiplanar CT image reconstructions and MIPs were obtained to evaluate the vascular anatomy. Carotid stenosis measurements (when applicable) are obtained utilizing NASCET criteria, using the distal internal carotid diameter as the denominator. CONTRAST:  91m OMNIPAQUE IOHEXOL 350 MG/ML SOLN COMPARISON:  None. FINDINGS: CTA NECK FINDINGS SKELETON: There is no bony spinal canal stenosis. No lytic or blastic lesion. OTHER NECK: Normal pharynx, larynx and major salivary glands. No cervical lymphadenopathy. Unremarkable thyroid gland. UPPER CHEST: Biapical scarring AORTIC ARCH: There is mild calcific atherosclerosis of the aortic arch. There is no aneurysm, dissection or hemodynamically significant stenosis of the visualized portion of the aorta. Conventional 3 vessel aortic branching pattern. There is approximately 50% stenosis of the proximal left subclavian artery, which is otherwise normal.  Right subclavian artery visualized portion is normal. RIGHT CAROTID SYSTEM: No dissection, occlusion or aneurysm. Mild atherosclerotic calcification at the carotid bifurcation without hemodynamically significant stenosis. LEFT CAROTID SYSTEM: Normal without aneurysm, dissection or stenosis. VERTEBRAL ARTERIES: Left dominant configuration. Both origins are clearly patent. There is no dissection, occlusion or flow-limiting stenosis to the skull base (V1-V3 segments). CTA HEAD FINDINGS POSTERIOR CIRCULATION: --Vertebral arteries: Normal V4 segments. --Posterior inferior cerebellar arteries (PICA): Patent origins from the vertebral arteries. --Anterior inferior cerebellar arteries (AICA): Patent origins from the basilar artery. --Basilar artery: Diminutive basilar artery --Superior cerebellar arteries: Normal. --Posterior cerebral arteries: Both are predominantly supplied by the posterior communicating arteries (p-comm). Moderate stenosis of the proximal left P2 segment (series 11, image 20). ANTERIOR CIRCULATION: --Intracranial internal carotid arteries: Normal. --Anterior cerebral arteries (ACA): Normal. Both A1 segments are present. Patent anterior communicating artery (a-comm). --Middle cerebral arteries (MCA): Normal. VENOUS SINUSES: As permitted by contrast timing, patent. ANATOMIC VARIANTS: None Review of the MIP images confirms the above findings. IMPRESSION: 1. No emergent large vessel occlusion. 2. Moderate stenosis of the proximal left PCA P2 segment. 3. Unchanged appearance of left cerebellar infarct. 4. Aortic Atherosclerosis (ICD10-I70.0). Electronically Signed   By: KUlyses JarredM.D.   On: 11/20/2018 02:27   Mr Brain Wo Contrast  Result Date: 11/20/2018 CLINICAL DATA:  Stroke follow-up EXAM: MRI HEAD WITHOUT CONTRAST TECHNIQUE: Multiplanar, multiecho pulse sequences of the brain and surrounding structures were obtained without intravenous contrast. COMPARISON:  None. FINDINGS: BRAIN: There is a large  acute infarct within the superior left cerebellar hemisphere. This extends into the left superior cerebellar peduncle. There is no mass effect. Mild edema. There is no acute hemorrhage. Multifocal white matter hyperintensity, most commonly due to chronic ischemic microangiopathy. There is generalized atrophy without lobar predilection. The midline structures are normal. VASCULAR: The major intracranial arterial and venous sinus flow voids are normal. Medial left cerebellar focus of chronic microhemorrhage. SKULL AND UPPER CERVICAL SPINE: Calvarial bone marrow signal is normal. There is no  skull base mass. The visualized upper cervical spine and soft tissues are normal. SINUSES/ORBITS: There are no fluid levels or advanced mucosal thickening. The mastoid air cells and middle ear cavities are free of fluid. The orbits are normal. IMPRESSION: 1. Large acute left cerebellar infarct without hemorrhage or mass effect. 2. Moderate chronic small vessel ischemic microangiopathy. Electronically Signed   By: Ulyses Jarred M.D.   On: 11/20/2018 03:12   Mr Thoracic Spine W Wo Contrast  Result Date: 11/18/2018 CLINICAL DATA:  Back pain EXAM: MRI THORACIC WITHOUT AND WITH CONTRAST TECHNIQUE: Multiplanar and multiecho pulse sequences of the thoracic spine were obtained without and with intravenous contrast. CONTRAST:  3m GADAVIST GADOBUTROL 1 MMOL/ML IV SOLN COMPARISON:  None. FINDINGS: MRI THORACIC SPINE FINDINGS Alignment:  Physiologic. Vertebrae: No fracture, evidence of discitis, or bone lesion. Cord:  Normal signal and morphology. Paraspinal and other soft tissues: Small pleural effusions Disc levels: At T7-8, there is an intermediate sized central disc protrusion that effaces the ventral thecal sac and contacts the anterior surface of the spinal cord. There is no spinal canal stenosis. At T8-9, there is a small right subarticular disc protrusion without stenosis. At T9-10, there is a small disc osteophyte complex,  greatest in the right subarticular zone, without spinal canal stenosis. At T11-12, there is a left subarticular disc protrusion without causing stenosis. IMPRESSION: 1. No discitis-osteomyelitis or epidural abscess. 2. 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 3. Central disc protrusion at T7-8 contacting the ventral surface of the spinal cord. Electronically Signed   By: KUlyses JarredM.D.   On: 11/18/2018 21:51   Mr Lumbar Spine W Wo Contrast  Result Date: 11/18/2018 CLINICAL DATA:  Back pain. Nausea and vomiting. EXAM: MRI LUMBAR SPINE WITHOUT AND WITH CONTRAST TECHNIQUE: Multiplanar and multiecho pulse sequences of the lumbar spine were obtained without and with intravenous contrast. CONTRAST:  629mGADAVIST GADOBUTROL 1 MMOL/ML IV SOLN COMPARISON:  None. FINDINGS: Segmentation:  Normal Alignment:  Dextroscoliosis with apex at L3 Vertebrae: No acute compression fracture, discitis-osteomyelitis, facet edema or other focal marrow lesion. No epidural collection. Conus medullaris and cauda equina: Conus extends to the L2 level. Conus and cauda equina appear normal. Paraspinal and other soft tissues: Negative. Disc levels: T12-L1: Normal disc space and facets. No spinal canal or neuroforaminal stenosis. L1-L2: Disc space narrowing with endplate spurring. No spinal canal stenosis. Severe right neural foraminal stenosis. L2-L3: Disc space narrowing with endplate spurring. Moderate bilateral neural foraminal stenosis. Mild facet hypertrophy. No central spinal canal stenosis. L3-L4: Disc space narrowing with diffuse bulge. Moderate facet hypertrophy. Mild spinal canal stenosis with severe narrowing of the left lateral recess. Mild right and severe left neural foraminal stenosis. L4-L5: Small disc bulge with mild facet hypertrophy. No spinal canal stenosis. Mild right and severe left neural foraminal stenosis. L5-S1: Small disc bulge with severe right facet hypertrophy. There is severe right  neural foraminal stenosis and narrowing of the right lateral recess. No central spinal canal or left neural foraminal stenosis. Visualized sacrum: Normal. IMPRESSION: 1. No epidural abscess or discitis-osteomyelitis. 2. 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 3. Multilevel moderate-to-severe neural foraminal stenosis. Electronically Signed   By: KeUlyses Jarred.D.   On: 11/18/2018 21:44   Mr Sacrum Si Joints W Wo Contrast  Result Date: 11/18/2018 CLINICAL DATA:  Back pain.  Possible infection. EXAM: MRI PELVIS WITHOUT AND WITH CONTRAST TECHNIQUE: Multiplanar multisequence MR imaging of the pelvis  was performed both before and after administration of intravenous contrast. CONTRAST:  20m GADAVIST GADOBUTROL 1 MMOL/ML IV SOLN COMPARISON:  CT scan from yesterday. FINDINGS: The bony structures are intact. No stress fracture or bone lesion. No findings suggests sacroiliitis or septic arthritis. Mild inflammatory changes involving the right piriformis muscle and also the upper aspect of the right iliopsoas muscle. This could reflect nonspecific myositis. Both hips are normally located. Small joint effusions but no findings suspicious for septic arthritis. No evidence of avascular necrosis. No significant intrapelvic abnormalities are identified. IMPRESSION: 1. No findings suspicious for septic arthritis or osteomyelitis involving the sacrum or SI joints or hip joints. 2. Areas of nonspecific myositis mainly involving the right piriformis muscle and the right iliopsoas muscle. 3. No significant intrapelvic abnormalities. Electronically Signed   By: PMarijo SanesM.D.   On: 11/18/2018 21:36   Vas UKoreaUpper Extremity Venous Duplex  Result Date: 11/20/2018 UPPER VENOUS STUDY  Indications: shoulder pain Comparison Study: no prior Performing Technologist: JJune LeapRDMS, RVT  Examination Guidelines: A complete evaluation includes B-mode imaging, spectral  Doppler, color Doppler, and power Doppler as needed of all accessible portions of each vessel. Bilateral testing is considered an integral part of a complete examination. Limited examinations for reoccurring indications may be performed as noted.  Left Findings: +----------+------------+---------+-----------+----------+-------+  LEFT       Compressible Phasicity Spontaneous Properties Summary  +----------+------------+---------+-----------+----------+-------+  IJV            Full        Yes        Yes                         +----------+------------+---------+-----------+----------+-------+  Subclavian     Full        Yes        Yes                         +----------+------------+---------+-----------+----------+-------+  Axillary       Full        Yes        Yes                         +----------+------------+---------+-----------+----------+-------+  Brachial       Full        Yes        Yes                         +----------+------------+---------+-----------+----------+-------+  Radial         Full                                               +----------+------------+---------+-----------+----------+-------+  Ulnar          Full                                               +----------+------------+---------+-----------+----------+-------+  Cephalic       None  Acute   +----------+------------+---------+-----------+----------+-------+  Basilic        Full                                               +----------+------------+---------+-----------+----------+-------+  Summary:  Left: No evidence of deep vein thrombosis in the upper extremity. Findings consistent with acute superficial vein thrombosis involving the left cephalic vein.  *See table(s) above for measurements and observations.  Diagnosing physician: Harold Barban MD Electronically signed by Harold Barban MD on 11/20/2018 at 32:59:46 AM.    Final      Assessment/Plan:  INTERVAL HISTORY:  Afebrile. WBC  12.3<<16.7<<8.4. Blood cultures from 10/6 positive for Staph aureus. LUE ultrasound with acute superficial vein thrombosis involving the L cephalic vein. MRI brain with large acute left cerebellar infarct without hemorrhage or mass effect. Transesophageal echo with very small mobile echodensity attached to the posterior mitral valve leaflet measuring 3.5 x 2 mm.   Principal Problem:   MRSA bacteremia Active Problems:   Nausea and vomiting   Bilateral hydronephrosis   Elevated troponin   Lactic acidosis   Chronic neck and back pain   Essential hypertension   Tachypnea   Postural dizziness with near syncope   Pain of upper abdomen   Septic embolism (HCC)   Cerebral embolism with cerebral infarction    Autumn [name] is a 85 y.o. female with significant PMH ofHTN, hypothyroidism, LBBB,arthritis,andGERDwho was admitted for nausea/vomiting, orthostatic hypotension, and generalized weakness. 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 2 major [facility] criteria met.   Will discuss further recommendations with the pt tomorrow. She has declined aggressive interventions/work-up thus far with gastroenterology and cardiology during her hospital stay. Next step would be to consult CT surgery for left sided endocarditis   Plan: 1. Continue Vancomycin, dosing per pharm 2. Monitor renal function on vancomycin    LOS: 7 days   Ladona Horns 11/20/2018, 2:27 PM

## 2018-11-20 NOTE — Progress Notes (Signed)
Autumn [name] (son) was informed of room transfer.

## 2018-11-20 NOTE — Progress Notes (Signed)
STROKE TEAM PROGRESS NOTE   INTERVAL HISTORY Pt lying in bed, AAO x3, complaining of left hand pain and back pain. Blood culture positive for Staph A. And Abx changed to nafcillin. TEE done showed normal EF and vegetations consistent with endocarditis. On ASA. CTA head and neck no mycotic aneurysms.   Vitals:   11/20/18 1113 11/20/18 1145 11/20/18 1318 11/20/18 1330  BP: (!) 119/46 (!) 132/40 (!) 122/43 (!) 123/36  Pulse: 78 82 76 74  Resp:  18 16 17   Temp: 98.3 F (36.8 C) 98.2 F (36.8 C)    TempSrc: Oral Oral    SpO2: 93% 100% 95%   Weight:      Height:        CBC:  Recent Labs  Lab 11/17/18 1750 11/18/18 0531 11/20/18 0303  WBC 13.0* 16.7* 12.3*  NEUTROABS 10.4* 14.5*  --   HGB 13.5 12.1 10.0*  HCT 38.9 35.1* 29.2*  MCV 101.3* 100.6* 101.4*  PLT 295 238 180    Basic Metabolic Panel:  Recent Labs  Lab 11/18/18 0531 11/20/18 0303  NA 130* 127*  K 4.0 4.0  CL 95* 94*  CO2 23 22  GLUCOSE 133* 156*  BUN 18 22  CREATININE 0.94 0.85  CALCIUM 8.8* 8.5*   Lipid Panel:     Component Value Date/Time   CHOL 178 11/19/2018 1609   TRIG 114 11/19/2018 1609   HDL 66 11/19/2018 1609   CHOLHDL 2.7 11/19/2018 1609   VLDL 23 11/19/2018 1609   LDLCALC 89 11/19/2018 1609   HgbA1c:  Lab Results  Component Value Date   HGBA1C 5.9 (H) 11/19/2018   Urine Drug Screen: No results found for: LABOPIA, COCAINSCRNUR, LABBENZ, AMPHETMU, THCU, LABBARB  Alcohol Level No results found for: ETH  IMAGING Ct Angio Head W Or Wo Contrast  Result Date: 11/20/2018 CLINICAL DATA:  Stroke follow-up EXAM: CT ANGIOGRAPHY HEAD AND NECK TECHNIQUE: Multidetector CT imaging of the head and neck was performed using the standard protocol during bolus administration of intravenous contrast. Multiplanar CT image reconstructions and MIPs were obtained to evaluate the vascular anatomy. Carotid stenosis measurements (when applicable) are obtained utilizing NASCET criteria, using the distal internal  carotid diameter as the denominator. CONTRAST:  34mL OMNIPAQUE IOHEXOL 350 MG/ML SOLN COMPARISON:  None. FINDINGS: CTA NECK FINDINGS SKELETON: There is no bony spinal canal stenosis. No lytic or blastic lesion. OTHER NECK: Normal pharynx, larynx and major salivary glands. No cervical lymphadenopathy. Unremarkable thyroid gland. UPPER CHEST: Biapical scarring AORTIC ARCH: There is mild calcific atherosclerosis of the aortic arch. There is no aneurysm, dissection or hemodynamically significant stenosis of the visualized portion of the aorta. Conventional 3 vessel aortic branching pattern. There is approximately 50% stenosis of the proximal left subclavian artery, which is otherwise normal. Right subclavian artery visualized portion is normal. RIGHT CAROTID SYSTEM: No dissection, occlusion or aneurysm. Mild atherosclerotic calcification at the carotid bifurcation without hemodynamically significant stenosis. LEFT CAROTID SYSTEM: Normal without aneurysm, dissection or stenosis. VERTEBRAL ARTERIES: Left dominant configuration. Both origins are clearly patent. There is no dissection, occlusion or flow-limiting stenosis to the skull base (V1-V3 segments). CTA HEAD FINDINGS POSTERIOR CIRCULATION: --Vertebral arteries: Normal V4 segments. --Posterior inferior cerebellar arteries (PICA): Patent origins from the vertebral arteries. --Anterior inferior cerebellar arteries (AICA): Patent origins from the basilar artery. --Basilar artery: Diminutive basilar artery --Superior cerebellar arteries: Normal. --Posterior cerebral arteries: Both are predominantly supplied by the posterior communicating arteries (p-comm). Moderate stenosis of the proximal left P2 segment (series 11, image  20). ANTERIOR CIRCULATION: --Intracranial internal carotid arteries: Normal. --Anterior cerebral arteries (ACA): Normal. Both A1 segments are present. Patent anterior communicating artery (a-comm). --Middle cerebral arteries (MCA): Normal. VENOUS  SINUSES: As permitted by contrast timing, patent. ANATOMIC VARIANTS: None Review of the MIP images confirms the above findings. IMPRESSION: 1. No emergent large vessel occlusion. 2. Moderate stenosis of the proximal left PCA P2 segment. 3. Unchanged appearance of left cerebellar infarct. 4. Aortic Atherosclerosis (ICD10-I70.0). Electronically Signed   By: Deatra RobinsonKevin  Herman M.D.   On: 11/20/2018 02:27   Ct Head Wo Contrast  Result Date: 11/19/2018 CLINICAL DATA:  85 year old with acute encephalopathy. She has Staph aureus sepsis/bacteremia. EXAM: CT HEAD WITHOUT CONTRAST TECHNIQUE: Contiguous axial images were obtained from the base of the skull through the vertex without intravenous contrast. COMPARISON:  None. FINDINGS: Brain: Ventricular system normal in size and appearance for age. Mild age-appropriate cortical atrophy. Geographic low attenuation involving the LEFT superior cerebellar hemisphere extending into the LEFT cerebellar peduncle. No associated hemorrhage. Mild changes of small vessel disease of the white matter diffusely. No acute hemorrhage or hematoma. No extra-axial fluid collections. Vascular: Moderate BILATERAL carotid siphon and LEFT vertebral artery atherosclerosis. No hyperdense vessel. Skull: No skull fracture or other focal osseous abnormality involving the skull. Sinuses/Orbits: Visualized paranasal sinuses, bilateral mastoid air cells and bilateral middle ear cavities well-aerated. Visualized orbits and globes normal in appearance. Other: None. IMPRESSION: 1. Acute/subacute nonhemorrhagic stroke involving the LEFT superior cerebellar hemisphere extending into the LEFT cerebellar peduncle. 2. Mild age-appropriate cortical atrophy and mild chronic microvascular ischemic changes of the white matter. These results will be called to the ordering clinician or representative by the Radiologist Assistant, and communication documented in the PACS or zVision Dashboard. Electronically Signed   By:  Hulan Saashomas  Lawrence M.D.   On: 11/19/2018 14:50   Ct Angio Neck W Or Wo Contrast  Result Date: 11/20/2018 CLINICAL DATA:  Stroke follow-up EXAM: CT ANGIOGRAPHY HEAD AND NECK TECHNIQUE: Multidetector CT imaging of the head and neck was performed using the standard protocol during bolus administration of intravenous contrast. Multiplanar CT image reconstructions and MIPs were obtained to evaluate the vascular anatomy. Carotid stenosis measurements (when applicable) are obtained utilizing NASCET criteria, using the distal internal carotid diameter as the denominator. CONTRAST:  75mL OMNIPAQUE IOHEXOL 350 MG/ML SOLN COMPARISON:  None. FINDINGS: CTA NECK FINDINGS SKELETON: There is no bony spinal canal stenosis. No lytic or blastic lesion. OTHER NECK: Normal pharynx, larynx and major salivary glands. No cervical lymphadenopathy. Unremarkable thyroid gland. UPPER CHEST: Biapical scarring AORTIC ARCH: There is mild calcific atherosclerosis of the aortic arch. There is no aneurysm, dissection or hemodynamically significant stenosis of the visualized portion of the aorta. Conventional 3 vessel aortic branching pattern. There is approximately 50% stenosis of the proximal left subclavian artery, which is otherwise normal. Right subclavian artery visualized portion is normal. RIGHT CAROTID SYSTEM: No dissection, occlusion or aneurysm. Mild atherosclerotic calcification at the carotid bifurcation without hemodynamically significant stenosis. LEFT CAROTID SYSTEM: Normal without aneurysm, dissection or stenosis. VERTEBRAL ARTERIES: Left dominant configuration. Both origins are clearly patent. There is no dissection, occlusion or flow-limiting stenosis to the skull base (V1-V3 segments). CTA HEAD FINDINGS POSTERIOR CIRCULATION: --Vertebral arteries: Normal V4 segments. --Posterior inferior cerebellar arteries (PICA): Patent origins from the vertebral arteries. --Anterior inferior cerebellar arteries (AICA): Patent origins from the  basilar artery. --Basilar artery: Diminutive basilar artery --Superior cerebellar arteries: Normal. --Posterior cerebral arteries: Both are predominantly supplied by the posterior communicating arteries (p-comm). Moderate stenosis of  the proximal left P2 segment (series 11, image 20). ANTERIOR CIRCULATION: --Intracranial internal carotid arteries: Normal. --Anterior cerebral arteries (ACA): Normal. Both A1 segments are present. Patent anterior communicating artery (a-comm). --Middle cerebral arteries (MCA): Normal. VENOUS SINUSES: As permitted by contrast timing, patent. ANATOMIC VARIANTS: None Review of the MIP images confirms the above findings. IMPRESSION: 1. No emergent large vessel occlusion. 2. Moderate stenosis of the proximal left PCA P2 segment. 3. Unchanged appearance of left cerebellar infarct. 4. Aortic Atherosclerosis (ICD10-I70.0). Electronically Signed   By: Ulyses Jarred M.D.   On: 11/20/2018 02:27   Mr Brain Wo Contrast  Result Date: 11/20/2018 CLINICAL DATA:  Stroke follow-up EXAM: MRI HEAD WITHOUT CONTRAST TECHNIQUE: Multiplanar, multiecho pulse sequences of the brain and surrounding structures were obtained without intravenous contrast. COMPARISON:  None. FINDINGS: BRAIN: There is a large acute infarct within the superior left cerebellar hemisphere. This extends into the left superior cerebellar peduncle. There is no mass effect. Mild edema. There is no acute hemorrhage. Multifocal white matter hyperintensity, most commonly due to chronic ischemic microangiopathy. There is generalized atrophy without lobar predilection. The midline structures are normal. VASCULAR: The major intracranial arterial and venous sinus flow voids are normal. Medial left cerebellar focus of chronic microhemorrhage. SKULL AND UPPER CERVICAL SPINE: Calvarial bone marrow signal is normal. There is no skull base mass. The visualized upper cervical spine and soft tissues are normal. SINUSES/ORBITS: There are no fluid  levels or advanced mucosal thickening. The mastoid air cells and middle ear cavities are free of fluid. The orbits are normal. IMPRESSION: 1. Large acute left cerebellar infarct without hemorrhage or mass effect. 2. Moderate chronic small vessel ischemic microangiopathy. Electronically Signed   By: Ulyses Jarred M.D.   On: 11/20/2018 03:12   Mr Thoracic Spine W Wo Contrast  Result Date: 11/18/2018 CLINICAL DATA:  Back pain EXAM: MRI THORACIC WITHOUT AND WITH CONTRAST TECHNIQUE: Multiplanar and multiecho pulse sequences of the thoracic spine were obtained without and with intravenous contrast. CONTRAST:  26mL GADAVIST GADOBUTROL 1 MMOL/ML IV SOLN COMPARISON:  None. FINDINGS: MRI THORACIC SPINE FINDINGS Alignment:  Physiologic. Vertebrae: No fracture, evidence of discitis, or bone lesion. Cord:  Normal signal and morphology. Paraspinal and other soft tissues: Small pleural effusions Disc levels: At T7-8, there is an intermediate sized central disc protrusion that effaces the ventral thecal sac and contacts the anterior surface of the spinal cord. There is no spinal canal stenosis. At T8-9, there is a small right subarticular disc protrusion without stenosis. At T9-10, there is a small disc osteophyte complex, greatest in the right subarticular zone, without spinal canal stenosis. At T11-12, there is a left subarticular disc protrusion without causing stenosis. IMPRESSION: 1. No discitis-osteomyelitis or epidural abscess. 2. 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 3. Central disc protrusion at T7-8 contacting the ventral surface of the spinal cord. Electronically Signed   By: Ulyses Jarred M.D.   On: 11/18/2018 21:51   Mr Lumbar Spine W Wo Contrast  Result Date: 11/18/2018 CLINICAL DATA:  Back pain. Nausea and vomiting. EXAM: MRI LUMBAR SPINE WITHOUT AND WITH CONTRAST TECHNIQUE: Multiplanar and multiecho pulse sequences of the lumbar spine were obtained without and with intravenous  contrast. CONTRAST:  9mL GADAVIST GADOBUTROL 1 MMOL/ML IV SOLN COMPARISON:  None. FINDINGS: Segmentation:  Normal Alignment:  Dextroscoliosis with apex at L3 Vertebrae: No acute compression fracture, discitis-osteomyelitis, facet edema or other focal marrow lesion. No epidural collection. Conus medullaris and cauda equina: Conus extends to the L2 level.  Conus and cauda equina appear normal. Paraspinal and other soft tissues: Negative. Disc levels: T12-L1: Normal disc space and facets. No spinal canal or neuroforaminal stenosis. L1-L2: Disc space narrowing with endplate spurring. No spinal canal stenosis. Severe right neural foraminal stenosis. L2-L3: Disc space narrowing with endplate spurring. Moderate bilateral neural foraminal stenosis. Mild facet hypertrophy. No central spinal canal stenosis. L3-L4: Disc space narrowing with diffuse bulge. Moderate facet hypertrophy. Mild spinal canal stenosis with severe narrowing of the left lateral recess. Mild right and severe left neural foraminal stenosis. L4-L5: Small disc bulge with mild facet hypertrophy. No spinal canal stenosis. Mild right and severe left neural foraminal stenosis. L5-S1: Small disc bulge with severe right facet hypertrophy. There is severe right neural foraminal stenosis and narrowing of the right lateral recess. No central spinal canal or left neural foraminal stenosis. Visualized sacrum: Normal. IMPRESSION: 1. No epidural abscess or discitis-osteomyelitis. 2. 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 3. Multilevel moderate-to-severe neural foraminal stenosis. Electronically Signed   By: Deatra Robinson M.D.   On: 11/18/2018 21:44   Mr Sacrum Si Joints W Wo Contrast  Result Date: 11/18/2018 CLINICAL DATA:  Back pain.  Possible infection. EXAM: MRI PELVIS WITHOUT AND WITH CONTRAST TECHNIQUE: Multiplanar multisequence MR imaging of the pelvis was performed both before and after administration  of intravenous contrast. CONTRAST:  6mL GADAVIST GADOBUTROL 1 MMOL/ML IV SOLN COMPARISON:  CT scan from yesterday. FINDINGS: The bony structures are intact. No stress fracture or bone lesion. No findings suggests sacroiliitis or septic arthritis. Mild inflammatory changes involving the right piriformis muscle and also the upper aspect of the right iliopsoas muscle. This could reflect nonspecific myositis. Both hips are normally located. Small joint effusions but no findings suspicious for septic arthritis. No evidence of avascular necrosis. No significant intrapelvic abnormalities are identified. IMPRESSION: 1. No findings suspicious for septic arthritis or osteomyelitis involving the sacrum or SI joints or hip joints. 2. Areas of nonspecific myositis mainly involving the right piriformis muscle and the right iliopsoas muscle. 3. No significant intrapelvic abnormalities. Electronically Signed   By: Rudie Meyer M.D.   On: 11/18/2018 21:36   Vas [place] Upper Extremity Venous Duplex  Result Date: 11/20/2018 UPPER VENOUS STUDY  Indications: shoulder pain Comparison Study: no prior Performing Technologist: Jeb Levering RDMS, RVT  Examination Guidelines: A complete evaluation includes B-mode imaging, spectral Doppler, color Doppler, and power Doppler as needed of all accessible portions of each vessel. Bilateral testing is considered an integral part of a complete examination. Limited examinations for reoccurring indications may be performed as noted.  Left Findings: +----------+------------+---------+-----------+----------+-------+ LEFT      CompressiblePhasicitySpontaneousPropertiesSummary +----------+------------+---------+-----------+----------+-------+ IJV           Full       Yes       Yes                      +----------+------------+---------+-----------+----------+-------+ Subclavian    Full       Yes       Yes                       +----------+------------+---------+-----------+----------+-------+ Axillary      Full       Yes       Yes                      +----------+------------+---------+-----------+----------+-------+ Brachial  Full       Yes       Yes                      +----------+------------+---------+-----------+----------+-------+ Radial        Full                                          +----------+------------+---------+-----------+----------+-------+ Ulnar         Full                                          +----------+------------+---------+-----------+----------+-------+ Cephalic      None                                   Acute  +----------+------------+---------+-----------+----------+-------+ Basilic       Full                                          +----------+------------+---------+-----------+----------+-------+  Summary:  Left: No evidence of deep vein thrombosis in the upper extremity. Findings consistent with acute superficial vein thrombosis involving the left cephalic vein.  *See table(s) above for measurements and observations.  Diagnosing physician: Coral Else MD Electronically signed by Coral Else MD on 11/20/2018 at 6:59:46 AM.    Final     PHYSICAL EXAM  Temp:  [97.4 F (36.3 C)-98.8 F (37.1 C)] 98.2 F (36.8 C) (10/07 1145) Pulse Rate:  [74-107] 74 (10/07 1330) Resp:  [16-18] 17 (10/07 1330) BP: (117-162)/(36-77) 123/36 (10/07 1330) SpO2:  [92 %-100 %] 95 % (10/07 1318)  General - Well nourished, well developed, mild distress due to back pain and left hand pain.  Ophthalmologic - fundi not visualized due to noncooperation.  Cardiovascular - Regular rhythm and rate.  Mental Status -  Level of arousal and orientation to time, place, and person were intact. Language including expression, naming, repetition, comprehension was assessed and found intact. Mild dysarthria  Cranial Nerves II - XII - II - Visual field intact OU. III, IV,  VI - Extraocular movements intact. V - Facial sensation intact bilaterally. VII - Facial movement intact bilaterally. VIII - Hearing & vestibular intact bilaterally. X - Palate elevates symmetrically. Mild dysarthria XI - Chin turning & shoulder shrug intact bilaterally. XII - Tongue protrusion intact.  Motor Strength - The patient's strength was normal in RUE, however LUE and BLE difficulty with testing due to pain. She had left shoulder and hand pain, barely against gravity proximal and 3+/5 hand grip. BLE distal 4/5 but proximal not able to move due to back pain.  Bulk was normal and fasciculations were absent.   Motor Tone - Muscle tone was assessed at the neck and appendages and was normal.  Reflexes - The patient's reflexes were symmetrical in all extremities and she had no pathological reflexes.  Sensory - Light touch, temperature/pinprick were assessed and were symmetrical.    Coordination - The patient had normal movements in the right hand with no ataxia or dysmetria. Not able to check on BLE or LUE. Tremor was absent.  Gait and Station - deferred.  ASSESSMENT/PLAN Autumn [name] is a 85 y.o. female with history of peripheral neuropathy, venous insufficiency, osteopenia, left bundle branch block, hypertension and anxiety presenting with nausea and vomiting w/ c/o's of lightheadedness, generalized weakness, SOB and neck and back pain. Admitted 11/12/2018 and found to have new onset HP w/ EF 25%, found unresponsive on bedpan with bradycardia and hypotension, severe LBP and MRSA bacteremia. CT done by NSG for waxing and waning mentation confirmed new stroke. Neuro was consulted.   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 endocarditis  CT head  L superior cerebellar infarct extending into L cerebellar peduncle.  Small vessel disease. Atrophy.     CTA head & neck no ELVO. Moderate stenosis prox L PCA P2.   MRI  Large L cerebellar infarct.  Moderate small vessel disease.  2D Echo 9/30 EF 25%. Small to moderate pericardial effusion. No source of embolus   Repeat 2D 10/6 EF 55-60%.  No veges seen, but not good study. TEE recommended  TEE EF 55-60%. very small mobile echodensity attached to the posterior mitral valve leaflet measuring 3.5 x 2 mm  LUE doppler superficial L cephalic thrombosis   LDL 89  [id number] 5.9  SCDs for VTE prophylaxis  No antithrombotic prior to admission, now on aspirin 325 mg daily. OK to continue ASA given negative for mycotic aneurysm and hx of cardiomyopathy  Therapy recommendations:  SNF  Disposition:  pending   Bacteremia and endocarditis  Blood culture showed staph A. 2/2  Vanco changed to nafcillin  TEE showed small mitral valve endocarditis  CTA head and neck no mycotic aneurysm  Cardiomyopathy, resolved 2D Echo 9/30 EF 25%. Small to moderate pericardial effusion.   Repeat 2D 10/6 EF 55-60%.  No veges seen, but not good study. TEE recommended  TEE EF 55-60%.  On ASA  Cardiology on board  Hypertension Orthostatic hypotension  Stable . Permissive hypertension (OK if < 180/105) but gradually normalize in 5-7 days . Long-term BP goal normotensive  Hyperlipidemia  Home meds:  No statin  Now on lipitor 20  Not on high intensity statins given advanced age and relatively low LDL not requiring drastic lowering  LDL 89, goal < 70  Continue statin at discharge  Other Stroke Risk Factors  Advanced age  ETOH use,  advised to drink no more than 1 drink(s) a day  Other Active Problems  orthostasis and near syncope  Severe LBP, MRI neg for diskitis or osteomyelitis. NSG following.  Hypothyroidism  GERD  Hospital day # 7  Neurology will sign off. Please call with questions. [facility]. Thanks for the consult.  Marvel PlanJindong Jarman Litton, MD PhD Stroke Neurology 11/20/2018 2:40 PM    To contact Stroke Continuity provider,  please refer to [email or web address]. After hours, contact General Neurology

## 2018-11-20 NOTE — Anesthesia Preprocedure Evaluation (Addendum)
Anesthesia Evaluation  Patient identified by MRN, date of birth, ID band Patient awake    Reviewed: Allergy & Precautions, NPO status , Patient's Chart, lab work & pertinent test results  Airway Mallampati: III  TM Distance: >3 FB Neck ROM: Full  Mouth opening: Limited Mouth Opening  Dental no notable dental hx. (+) Teeth Intact, Dental Advisory Given   Pulmonary neg pulmonary ROS,    Pulmonary exam normal breath sounds clear to auscultation       Cardiovascular hypertension, +CHF  Normal cardiovascular exam Rhythm:Regular Rate:Normal  Known LBBB  TTE 11/19/18 Normal EF, valves ok   Neuro/Psych PSYCHIATRIC DISORDERS Anxiety CVA, Residual Symptoms    GI/Hepatic Neg liver ROS, GERD  ,  Endo/Other  Hypothyroidism   Renal/GU Renal InsufficiencyRenal disease  negative genitourinary   Musculoskeletal negative musculoskeletal ROS (+)   Abdominal   Peds  Hematology negative hematology ROS (+)   Anesthesia Other Findings TEE for MRSA bacteremia. Admitted 11/12/18 from ED for N/V. On 11/15/18, unresponsive, bradycardic, and hypotension found to have acute/subacute CVA. Also found to have EF 25-30% now normalized  Reproductive/Obstetrics                            Anesthesia Physical Anesthesia Plan  ASA: IV  Anesthesia Plan: MAC   Post-op Pain Management:    Induction: Intravenous  PONV Risk Score and Plan: 2 and Propofol infusion and Treatment may vary due to age or medical condition  Airway Management Planned: Natural Airway  Additional Equipment:   Intra-op Plan:   Post-operative Plan:   Informed Consent: I have reviewed the patients History and Physical, chart, labs and discussed the procedure including the risks, benefits and alternatives for the proposed anesthesia with the patient or authorized representative who has indicated his/her understanding and acceptance.   Patient has  DNR.  Discussed DNR with patient and Suspend DNR.   Dental advisory given  Plan Discussed with: CRNA  Anesthesia Plan Comments:         Anesthesia Quick Evaluation

## 2018-11-20 NOTE — Progress Notes (Signed)
SLP Cancellation Note  Patient Details Name: Autumn [name] MRN: [id number] DOB: Nov 04, 1932   Cancelled treatment:       Reason Eval/Treat Not Completed: Patient at procedure or test/unavailable   Billijo Dilling 11/20/2018, 1:06 PM

## 2018-11-20 NOTE — Plan of Care (Signed)
  Problem: Clinical Measurements: Goal: Respiratory complications will improve Outcome: Progressing   Problem: Nutrition: Goal: Adequate nutrition will be maintained Outcome: Progressing   Problem: Coping: Goal: Level of anxiety will decrease Outcome: Progressing   Problem: Pain Managment: Goal: General experience of comfort will improve Outcome: Progressing   

## 2018-11-20 NOTE — Progress Notes (Addendum)
PROGRESS NOTE    Autumn [name]  ZOX:[id number] DOB: 08/25/1932 DOA: 11/12/2018 PCP: Merlene Laughter, MD     Brief Narrative:  Autumn [name] a 85 y.o.femalewith medical history significant ofhypertension, hypothyroidism,LBBB,arthritis,andGERD;who presents with complaints of nausea and vomiting. Autumn [name] began feeling lightheaded as though she may pass out and was unable to walk while brushing her teeth. She was able to sit down on the toilet in the bathroom and became nauseated and vomited.Associated symptoms include complaints of generalized malaise, shortness of breath, achy neck, and achy back pain. She reports that the back and neck pain complaints are not new and related to her history of arthritis. She has not had any significant cough, fever, headache, change in vision, focal weakness, dysuria, or change in bowel habits.  ED Course:Upon admission into the emergency department patient was seen to be afebrile, pulse 80-106, respirations 15-30, blood pressures 137/94-172/74, and O2 saturation maintained on room air. Orthostatic vital signs were noted to be positive after standing for 3 minutes. Labs significant for MCV 103.4, MCH 34.4, sodium 131, chloride 97, CO2 18, BUN 21, creatinine 1.01, anion gap 16,lactic acid 4.6->3.8, and troponin42->76. CXRshowed no acute abnormality and UA negative for any signs of infection.Blood cultures were ordered. She was started on normal saline IV fluids at 100 mL/h.She has had no more nausea or vomiting since admission,but still does not feel well.  Hospital course complicated by elevated troponin, abnormal 2D echo with LVEF 25%, small to moderate pericardial effusion.  Seen by cardiology and patient declined aggressive cardiac work-up per cardiology.  Persistent abdominal discomfort. States up to 2 weeks prior to presentation was taking ibuprofen daily for months. Unintentional weight loss 5 lbs in the last 4-8 weeks. Seen by GI,  declined EGD per GI. Due to continued complaints of severe back pain, patient underwent MRI lumbar, thoracic spine, sacrum.  Neurosurgery consulted.  Blood cultures also resulted with staph aureus.  Patient underwent CT head which revealed left acute/subacute cerebellar ischemic infarct.  Neurology consulted.  New events last 24 hours / Subjective: No acute events overnight.  Patient very somnolent, arousable to voice but falls asleep and does not attempt to stay awake or answer any questions.  TEE planned for later today.  Assessment & Plan:   Principal Problem:   MRSA bacteremia Active Problems:   Nausea and vomiting   Bilateral hydronephrosis   Elevated troponin   Lactic acidosis   Chronic neck and back pain   Essential hypertension   Tachypnea   Postural dizziness with near syncope   Pain of upper abdomen   Septic embolism (HCC)   Acute ischemic left cerebellar CVA -CT head without contrast revealed acute/subacute nonhemorrhagic stroke involving the left superior cerebellar hemisphere extending into the left cerebellar peduncle  -MRI brain revealed large acute left cerebellar infarct without hemorrhage or mass-effect -Neurology following  Sepsis secondary to staph aureus bacteremia, and endocarditis -Not POA -ID following -Nafcillin IV  -TEE today revealed posterior mitral valve echodensity suspicious of vegetation  Intractable nausea and abdominal pain, unclear etiology -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 -Neurosurgery following   Acute on chronic combined systolic and diastolic CHF/small to moderate pericardial effusion. -Seen by Dr.Hochrein,per review of medical records on 04/10/2014 she had an ejection fraction of 35%,had a left heart cath in September 02, 2013 with history of nonischemic cardiomyopathy -2D echo done on 11/13/2018 showed LVEF 25% with diffuse hypokinesis and septal lateral dyssynchrony consistent with LBBB as well as grade 1 diastolic dysfunction -Seen by cardiology, patient declines aggressive cardiac work-up. -Follow-up with your cardiologist outpatient  Generalized weakness with physical debility likely in the setting of acute illness -PT OT recommended SNF  Essential hypertension complicated by orthostatic hypotension -No diuretics due to orthostatic hypotension -Euvolemic on exam -Normal BNP 65 on 11/12/2018 -Follow-up with cardiology -Continue coreg, cozaar   Elevated troponin, likely demand ischemia -High-sensitivity troponin peaked at 196 and trended down -Seen by cardiology, no plan for further ischemic work-up; patient declines aggressive cardiac work-up. -Follow-up with cardiology outpatient -Repeated troponin on 11/18/18, 47 and trending down  HLD -Continue lipitor   Chronic euvolemic hyponatremia -Asymptomatic -Appears to have had hyponatremia since 2018  Resolved urinary retention with bilateral hydronephrosis -Follow-up with urology; obtain referral from your primary care provider  Hypothyroidism  -Continue levothyroxine  GERD -Continue Protonix  Acute superficial vein thrombosis involving the left cephalic vein -Supportive care    DVT prophylaxis: SCD Code Status: DNR Family Communication: None Disposition Plan: Pending further clinical  improvement, TEE for later today   Consultants:   Vascular surgery  Cardiology  GI  Neurosurgery  Infectious disease  Neurology   Antimicrobials:  Anti-infectives (From admission, onward)   Start     Dose/Rate Route Frequency Ordered Stop   11/20/18 1115  vancomycin (VANCOCIN) IVPB 750 mg/150 ml premix  Status:  Discontinued     750 mg 150 mL/hr over 60 Minutes Intravenous Every 24 hours 11/19/18 1057 11/20/18 1010   11/20/18 1100  nafcillin 2 g in sodium chloride 0.9 % 100 mL IVPB     2 g 200 mL/hr over 30 Minutes Intravenous Every 4 hours 11/20/18 1010     11/19/18 1115  vancomycin (VANCOCIN) 1,750 mg in sodium chloride 0.9 % 500 mL IVPB     1,750 mg 250 mL/hr over 120 Minutes Intravenous  Once 11/19/18 1042 11/19/18 1419       Objective: Vitals:   11/20/18 1113 11/20/18 1145 11/20/18 1318 11/20/18 1330  BP: (!) 119/46 (!) 132/40 (!) 122/43 (!) 123/36  Pulse: 78 82 76 74  Resp:  18 16 17   Temp: 98.3 F (36.8 C) 98.2 F (36.8 C)    TempSrc: Oral Oral    SpO2: 93% 100% 95%   Weight:      Height:        Intake/Output Summary (Last 24 hours) at 11/20/2018 1403 Last data filed at 11/20/2018 1313 Gross per 24 hour  Intake 950.31 ml  Output 650 ml  Net 300.31 ml   Filed Weights   11/17/18 0658 11/18/18 0631 11/19/18 0345  Weight: 67.4 kg 67.6 kg 68 kg    Examination:  General exam: Appears calm and comfortable  Respiratory system: Clear to auscultation. Respiratory effort normal. No respiratory distress. No conversational dyspnea.  Cardiovascular system: S1 & S2 heard, RRR. No murmurs. No pedal edema. Gastrointestinal system: Abdomen is nondistended, soft and nontender. Normal bowel sounds heard. Central  nervous system: Alert to voice, somnolent and lethargic on exam today  Extremities: Symmetric in appearance  Skin: No rashes, lesions or ulcers on exposed skin   Data Reviewed: I have personally reviewed following labs and imaging studies  CBC: Recent  Labs  Lab 11/17/18 1750 11/18/18 0531 11/20/18 0303  WBC 13.0* 16.7* 12.3*  NEUTROABS 10.4* 14.5*  --   HGB 13.5 12.1 10.0*  HCT 38.9 35.1* 29.2*  MCV 101.3* 100.6* 101.4*  PLT 295 238 180   Basic Metabolic Panel: Recent Labs  Lab 11/15/18 0732 11/17/18 1243 11/17/18 1750 11/18/18 0531 11/20/18 0303  NA 130* 131* 131* 130* 127*  K 3.7 4.1 4.9 4.0 4.0  CL 97* 99 94* 95* 94*  CO2 22 24 27 23 22   GLUCOSE 107* 136* 125* 133* 156*  BUN 14 20 19 18 22   CREATININE 0.75 0.80 0.81 0.94 0.85  CALCIUM 8.9 8.7* 9.4 8.8* 8.5*   GFR: Estimated Creatinine Clearance: 45.6 mL/min (by C-G formula based on SCr of 0.85 mg/dL). Liver Function Tests: Recent Labs  Lab 11/14/18 0304 11/17/18 1750 11/18/18 0531  AST 26 32 37  ALT 15 25 23   ALKPHOS 35* 50 44  BILITOT 0.5 0.8 0.3  PROT 5.7* 7.1 6.2*  ALBUMIN 2.9* 3.3* 2.7*   No results for input(s): LIPASE, AMYLASE in the last 168 hours. No results for input(s): AMMONIA in the last 168 hours. Coagulation Profile: No results for input(s): INR, PROTIME in the last 168 hours. Cardiac Enzymes: Recent Labs  Lab 11/18/18 0531  CKTOTAL 428*   BNP (last 3 results) No results for input(s): PROBNP in the last 8760 hours. HbA1C: Recent Labs    11/19/18 1630  HGBA1C 5.9*   CBG: No results for input(s): GLUCAP in the last 168 hours. Lipid Profile: Recent Labs    11/19/18 1609  CHOL 178  HDL 66  LDLCALC 89  TRIG 114  CHOLHDL 2.7   Thyroid Function Tests: No results for input(s): TSH, T4TOTAL, FREET4, T3FREE, THYROIDAB in the last 72 hours. Anemia Panel: No results for input(s): VITAMINB12, FOLATE, FERRITIN, TIBC, IRON, RETICCTPCT in the last 72 hours. Sepsis Labs: Recent Labs  Lab 11/17/18 1750 11/17/18 2057 11/18/18 0531 11/18/18 0751  PROCALCITON <0.10  --   --   --   LATICACIDVEN 2.1* 3.3* 1.9 2.4*    Recent Results (from the past 240 hour(s))  Culture, blood (routine x 2)     Status: Abnormal   Collection Time:  11/12/18  2:10 AM   Specimen: BLOOD  Result Value Ref Range Status   Specimen Description   Final    BLOOD RIGHT ANTECUBITAL Performed at [facility], [address], [place], [place] [address]    Special Requests   Final    BOTTLES DRAWN AEROBIC AND ANAEROBIC Blood Culture adequate volume Performed at [facility], [address] [place], [place] [address]    Culture  Setup Time   Final    GRAM POSITIVE COCCI IN BOTH AEROBIC AND ANAEROBIC BOTTLES CRITICAL RESULT CALLED TO, READ BACK BY AND VERIFIED WITH: PHARMD T BAUMEISTER [id number] MLM    Culture (A)  Final    STAPHYLOCOCCUS SPECIES (COAGULASE NEGATIVE) THE SIGNIFICANCE OF ISOLATING THIS ORGANISM FROM A SINGLE SET OF BLOOD CULTURES WHEN MULTIPLE SETS ARE DRAWN IS UNCERTAIN. PLEASE NOTIFY THE MICROBIOLOGY DEPARTMENT WITHIN ONE WEEK IF SPECIATION AND SENSITIVITIES ARE REQUIRED. Performed at [facility], [address], [place], [place] [address]    Report Status  11/15/2018 FINAL  Final  Blood Culture ID Panel (Reflexed)     Status: Abnormal   Collection Time: 11/12/18  2:10 AM  Result Value Ref Range Status   Enterococcus species NOT DETECTED NOT DETECTED Final   Listeria monocytogenes NOT DETECTED NOT DETECTED Final   Staphylococcus species DETECTED (A) NOT DETECTED Final    Comment: Methicillin (oxacillin) susceptible coagulase negative staphylococcus. Possible blood culture contaminant (unless isolated from more than one blood culture draw or clinical case suggests pathogenicity). No antibiotic treatment is indicated for blood  culture contaminants. CRITICAL RESULT CALLED TO, READ BACK BY AND VERIFIED WITH: PHARMD T BAUMEISTER [id number] MLM    Staphylococcus aureus (BCID) NOT DETECTED NOT DETECTED Final   Methicillin resistance NOT DETECTED NOT DETECTED Final   Streptococcus species NOT DETECTED NOT DETECTED Final   Streptococcus agalactiae NOT DETECTED NOT DETECTED Final   Streptococcus  pneumoniae NOT DETECTED NOT DETECTED Final   Streptococcus pyogenes NOT DETECTED NOT DETECTED Final   Acinetobacter baumannii NOT DETECTED NOT DETECTED Final   Enterobacteriaceae species NOT DETECTED NOT DETECTED Final   Enterobacter cloacae complex NOT DETECTED NOT DETECTED Final   Escherichia coli NOT DETECTED NOT DETECTED Final   Klebsiella oxytoca NOT DETECTED NOT DETECTED Final   Klebsiella pneumoniae NOT DETECTED NOT DETECTED Final   Proteus species NOT DETECTED NOT DETECTED Final   Serratia marcescens NOT DETECTED NOT DETECTED Final   Haemophilus influenzae NOT DETECTED NOT DETECTED Final   Neisseria meningitidis NOT DETECTED NOT DETECTED Final   Pseudomonas aeruginosa NOT DETECTED NOT DETECTED Final   Candida albicans NOT DETECTED NOT DETECTED Final   Candida glabrata NOT DETECTED NOT DETECTED Final   Candida krusei NOT DETECTED NOT DETECTED Final   Candida parapsilosis NOT DETECTED NOT DETECTED Final   Candida tropicalis NOT DETECTED NOT DETECTED Final    Comment: Performed at [facility], [address] [place], [place] [address]  SARS CORONAVIRUS 2 (TAT 6-24 HRS) Nasopharyngeal Nasopharyngeal Swab     Status: None   Collection Time: 11/12/18  5:47 AM   Specimen: Nasopharyngeal Swab  Result Value Ref Range Status   SARS Coronavirus 2 NEGATIVE NEGATIVE Final    Comment: (NOTE) SARS-CoV-2 target nucleic acids are NOT DETECTED. The SARS-CoV-2 RNA is generally detectable in upper and lower respiratory specimens during the acute phase of infection. Negative results do not preclude SARS-CoV-2 infection, do not rule out co-infections with other pathogens, and should not be used as the sole basis for treatment or other patient management decisions. Negative results must be combined with clinical observations, patient history, and epidemiological information. The expected result is Negative. Fact Sheet for Patients: HairSlick.no Fact Sheet for  Healthcare Providers: quierodirigir.com This test is not yet approved or cleared by the [place] FDA and  has been authorized for detection and/or diagnosis of SARS-CoV-2 by FDA under an Emergency Use Authorization (EUA). This EUA will remain  in effect (meaning this test can be used) for the duration of the COVID-19 declaration under Section 56 4(b)(1) of the Act, 21 U.S.C. section 360bbb-3(b)(1), unless the authorization is terminated or revoked sooner. Performed at [facility], [address] [place], [place] [address]   Culture, blood (routine x 2)     Status: None   Collection Time: 11/12/18  7:02 PM   Specimen: BLOOD LEFT HAND  Result Value Ref Range Status   Specimen Description BLOOD LEFT HAND  Final   Special Requests   Final  BOTTLES DRAWN AEROBIC ONLY Blood Culture adequate volume   Culture   Final    NO GROWTH 5 DAYS Performed at [facility], [address] [place], [place] [address]    Report Status 11/17/2018 FINAL  Final  Novel Coronavirus, NAA (Hosp order, Send-out to Ref Lab; TAT 18-24 hrs     Status: None   Collection Time: 11/16/18  4:51 PM   Specimen: Nasopharyngeal Swab; Respiratory  Result Value Ref Range Status   SARS-CoV-2, NAA NOT DETECTED NOT DETECTED Final    Comment: (NOTE) This nucleic acid amplification test was developed and its performance characteristics determined by [facility]. Nucleic acid amplification tests include PCR and TMA. This test has not been FDA cleared or approved. This test has been authorized by FDA under an Emergency Use Authorization (EUA). This test is only authorized for the duration of time the declaration that circumstances exist justifying the authorization of the emergency use of in vitro diagnostic tests for detection of SARS-CoV-2 virus and/or diagnosis of COVID-19 infection under section 564(b)(1) of the Act, 21 U.S.C. 528UXL-2(G360bbb-3(b) (1), unless the authorization  is terminated or revoked sooner. When diagnostic testing is negative, the possibility of a false negative result should be considered in the context of a patient's recent exposures and the presence of clinical signs and symptoms consistent with COVID-19. An individual without symptoms of COVID- 19 and who is not shedding SARS-CoV-2 vi rus would expect to have a negative (not detected) result in this assay. Performed At: [facility] [address] [place], [place] [id number] Jolene SchimkeNagendra Sanjai MD GU:[phone]    Coronavirus Source NASOPHARYNGEAL  Final    Comment: Performed at [facility], [address] [place], [place] [address]  Culture, blood (routine x 2)     Status: Abnormal   Collection Time: 11/18/18  7:45 AM   Specimen: BLOOD LEFT ARM  Result Value Ref Range Status   Specimen Description BLOOD LEFT ARM  Final   Special Requests   Final    BOTTLES DRAWN AEROBIC ONLY Blood Culture results may not be optimal due to an inadequate volume of blood received in culture bottles   Culture  Setup Time   Final    AEROBIC BOTTLE ONLY GRAM POSITIVE COCCI CRITICAL VALUE NOTED.  VALUE IS CONSISTENT WITH PREVIOUSLY REPORTED AND CALLED VALUE.    Culture (A)  Final    STAPHYLOCOCCUS AUREUS SUSCEPTIBILITIES PERFORMED ON PREVIOUS CULTURE WITHIN THE LAST 5 DAYS. Performed at [facility], [address] [place], [place] [address]    Report Status 11/20/2018 FINAL  Final  Culture, blood (routine x 2)     Status: Abnormal   Collection Time: 11/18/18  7:51 AM   Specimen: BLOOD LEFT HAND  Result Value Ref Range Status   Specimen Description BLOOD LEFT HAND  Final   Special Requests   Final    BOTTLES DRAWN AEROBIC ONLY Blood Culture results may not be optimal due to an inadequate volume of blood received in culture bottles   Culture  Setup Time   Final    AEROBIC BOTTLE ONLY GRAM POSITIVE COCCI CRITICAL VALUE NOTED.  VALUE IS CONSISTENT WITH PREVIOUSLY REPORTED AND CALLED  VALUE. Performed at [facility], [address] [place], [place] [address]    Culture STAPHYLOCOCCUS AUREUS (A)  Final   Report Status 11/20/2018 FINAL  Final   Organism ID, Bacteria STAPHYLOCOCCUS AUREUS  Final      Susceptibility   Staphylococcus aureus -  MIC*    CIPROFLOXACIN <=0.5 SENSITIVE Sensitive     ERYTHROMYCIN <=0.25 SENSITIVE Sensitive     GENTAMICIN <=0.5 SENSITIVE Sensitive     OXACILLIN <=0.25 SENSITIVE Sensitive     TETRACYCLINE <=1 SENSITIVE Sensitive     VANCOMYCIN <=0.5 SENSITIVE Sensitive     TRIMETH/SULFA <=10 SENSITIVE Sensitive     CLINDAMYCIN <=0.25 SENSITIVE Sensitive     RIFAMPIN <=0.5 SENSITIVE Sensitive     Inducible Clindamycin NEGATIVE Sensitive     * STAPHYLOCOCCUS AUREUS  MRSA PCR Screening     Status: None   Collection Time: 11/19/18 10:48 AM   Specimen: Nasal Mucosa; Nasopharyngeal  Result Value Ref Range Status   MRSA by PCR NEGATIVE NEGATIVE Final    Comment:        The GeneXpert MRSA Assay (FDA approved for NASAL specimens only), is one component of a comprehensive MRSA colonization surveillance program. It is not intended to diagnose MRSA infection nor to guide or monitor treatment for MRSA infections. Performed at [facility], [address] [place], [place] [address]   Culture, blood (routine x 2)     Status: Abnormal   Collection Time: 11/19/18 11:22 AM   Specimen: BLOOD  Result Value Ref Range Status   Specimen Description BLOOD RIGHT ANTECUBITAL  Final   Special Requests   Final    AEROBIC BOTTLE ONLY Blood Culture results may not be optimal due to an excessive volume of blood received in culture bottles   Culture  Setup Time   Final    AEROBIC BOTTLE ONLY GRAM POSITIVE COCCI CRITICAL VALUE NOTED.  VALUE IS CONSISTENT WITH PREVIOUSLY REPORTED AND CALLED VALUE.    Culture (A)  Final    STAPHYLOCOCCUS AUREUS SUSCEPTIBILITIES PERFORMED ON PREVIOUS CULTURE WITHIN THE LAST 5 DAYS. Performed at [facility], [address] [place], [place] [address]    Report Status 11/20/2018 FINAL  Final  Culture, blood (routine x 2)     Status: Abnormal   Collection Time: 11/19/18 11:31 AM   Specimen: BLOOD RIGHT HAND  Result Value Ref Range Status   Specimen Description BLOOD RIGHT HAND  Final   Special Requests AEROBIC BOTTLE ONLY Blood Culture adequate volume  Final   Culture  Setup Time   Final    AEROBIC BOTTLE ONLY GRAM POSITIVE COCCI CRITICAL VALUE NOTED.  VALUE IS CONSISTENT WITH PREVIOUSLY REPORTED AND CALLED VALUE.    Culture (A)  Final    STAPHYLOCOCCUS AUREUS SUSCEPTIBILITIES PERFORMED ON PREVIOUS CULTURE WITHIN THE LAST 5 DAYS. Performed at [facility], [address], [place], [place] [address]    Report Status 11/20/2018 FINAL  Final      Radiology Studies: Ct Angio Head W Or Wo Contrast  Result Date: 11/20/2018 CLINICAL DATA:  Stroke follow-up EXAM: CT ANGIOGRAPHY HEAD AND NECK TECHNIQUE: Multidetector CT imaging of the head and neck was performed using the standard protocol during bolus administration of intravenous contrast. Multiplanar CT image reconstructions and MIPs were obtained to evaluate the vascular anatomy. Carotid stenosis measurements (when applicable) are obtained utilizing NASCET criteria, using the distal internal carotid diameter as the denominator. CONTRAST:  75mL OMNIPAQUE IOHEXOL 350 MG/ML SOLN COMPARISON:  None. FINDINGS: CTA NECK FINDINGS SKELETON: There is no bony spinal canal stenosis. No lytic or blastic lesion. OTHER NECK: Normal pharynx, larynx and major salivary glands. No cervical lymphadenopathy. Unremarkable thyroid gland. UPPER CHEST: Biapical scarring AORTIC ARCH: There is mild calcific atherosclerosis of the aortic arch. There  is no aneurysm, dissection or hemodynamically significant stenosis of the visualized portion of the aorta. Conventional 3 vessel aortic branching pattern. There is approximately 50% stenosis of the proximal left subclavian  artery, which is otherwise normal. Right subclavian artery visualized portion is normal. RIGHT CAROTID SYSTEM: No dissection, occlusion or aneurysm. Mild atherosclerotic calcification at the carotid bifurcation without hemodynamically significant stenosis. LEFT CAROTID SYSTEM: Normal without aneurysm, dissection or stenosis. VERTEBRAL ARTERIES: Left dominant configuration. Both origins are clearly patent. There is no dissection, occlusion or flow-limiting stenosis to the skull base (V1-V3 segments). CTA HEAD FINDINGS POSTERIOR CIRCULATION: --Vertebral arteries: Normal V4 segments. --Posterior inferior cerebellar arteries (PICA): Patent origins from the vertebral arteries. --Anterior inferior cerebellar arteries (AICA): Patent origins from the basilar artery. --Basilar artery: Diminutive basilar artery --Superior cerebellar arteries: Normal. --Posterior cerebral arteries: Both are predominantly supplied by the posterior communicating arteries (p-comm). Moderate stenosis of the proximal left P2 segment (series 11, image 20). ANTERIOR CIRCULATION: --Intracranial internal carotid arteries: Normal. --Anterior cerebral arteries (ACA): Normal. Both A1 segments are present. Patent anterior communicating artery (a-comm). --Middle cerebral arteries (MCA): Normal. VENOUS SINUSES: As permitted by contrast timing, patent. ANATOMIC VARIANTS: None Review of the MIP images confirms the above findings. IMPRESSION: 1. No emergent large vessel occlusion. 2. Moderate stenosis of the proximal left PCA P2 segment. 3. Unchanged appearance of left cerebellar infarct. 4. Aortic Atherosclerosis (ICD10-I70.0). Electronically Signed   By: Deatra RobinsonKevin  Herman M.D.   On: 11/20/2018 02:27   Ct Head Wo Contrast  Result Date: 11/19/2018 CLINICAL DATA:  85 year old with acute encephalopathy. She has Staph aureus sepsis/bacteremia. EXAM: CT HEAD WITHOUT CONTRAST TECHNIQUE: Contiguous axial images were obtained from the base of the skull through the  vertex without intravenous contrast. COMPARISON:  None. FINDINGS: Brain: Ventricular system normal in size and appearance for age. Mild age-appropriate cortical atrophy. Geographic low attenuation involving the LEFT superior cerebellar hemisphere extending into the LEFT cerebellar peduncle. No associated hemorrhage. Mild changes of small vessel disease of the white matter diffusely. No acute hemorrhage or hematoma. No extra-axial fluid collections. Vascular: Moderate BILATERAL carotid siphon and LEFT vertebral artery atherosclerosis. No hyperdense vessel. Skull: No skull fracture or other focal osseous abnormality involving the skull. Sinuses/Orbits: Visualized paranasal sinuses, bilateral mastoid air cells and bilateral middle ear cavities well-aerated. Visualized orbits and globes normal in appearance. Other: None. IMPRESSION: 1. Acute/subacute nonhemorrhagic stroke involving the LEFT superior cerebellar hemisphere extending into the LEFT cerebellar peduncle. 2. Mild age-appropriate cortical atrophy and mild chronic microvascular ischemic changes of the white matter. These results will be called to the ordering clinician or representative by the Radiologist Assistant, and communication documented in the PACS or zVision Dashboard. Electronically Signed   By: Hulan Saashomas  Lawrence M.D.   On: 11/19/2018 14:50   Ct Angio Neck W Or Wo Contrast  Result Date: 11/20/2018 CLINICAL DATA:  Stroke follow-up EXAM: CT ANGIOGRAPHY HEAD AND NECK TECHNIQUE: Multidetector CT imaging of the head and neck was performed using the standard protocol during bolus administration of intravenous contrast. Multiplanar CT image reconstructions and MIPs were obtained to evaluate the vascular anatomy. Carotid stenosis measurements (when applicable) are obtained utilizing NASCET criteria, using the distal internal carotid diameter as the denominator. CONTRAST:  75mL OMNIPAQUE IOHEXOL 350 MG/ML SOLN COMPARISON:  None. FINDINGS: CTA NECK FINDINGS  SKELETON: There is no bony spinal canal stenosis. No lytic or blastic lesion. OTHER NECK: Normal pharynx, larynx and major salivary glands. No cervical lymphadenopathy. Unremarkable thyroid gland. UPPER CHEST: Biapical scarring AORTIC ARCH: There is  mild calcific atherosclerosis of the aortic arch. There is no aneurysm, dissection or hemodynamically significant stenosis of the visualized portion of the aorta. Conventional 3 vessel aortic branching pattern. There is approximately 50% stenosis of the proximal left subclavian artery, which is otherwise normal. Right subclavian artery visualized portion is normal. RIGHT CAROTID SYSTEM: No dissection, occlusion or aneurysm. Mild atherosclerotic calcification at the carotid bifurcation without hemodynamically significant stenosis. LEFT CAROTID SYSTEM: Normal without aneurysm, dissection or stenosis. VERTEBRAL ARTERIES: Left dominant configuration. Both origins are clearly patent. There is no dissection, occlusion or flow-limiting stenosis to the skull base (V1-V3 segments). CTA HEAD FINDINGS POSTERIOR CIRCULATION: --Vertebral arteries: Normal V4 segments. --Posterior inferior cerebellar arteries (PICA): Patent origins from the vertebral arteries. --Anterior inferior cerebellar arteries (AICA): Patent origins from the basilar artery. --Basilar artery: Diminutive basilar artery --Superior cerebellar arteries: Normal. --Posterior cerebral arteries: Both are predominantly supplied by the posterior communicating arteries (p-comm). Moderate stenosis of the proximal left P2 segment (series 11, image 20). ANTERIOR CIRCULATION: --Intracranial internal carotid arteries: Normal. --Anterior cerebral arteries (ACA): Normal. Both A1 segments are present. Patent anterior communicating artery (a-comm). --Middle cerebral arteries (MCA): Normal. VENOUS SINUSES: As permitted by contrast timing, patent. ANATOMIC VARIANTS: None Review of the MIP images confirms the above findings. IMPRESSION:  1. No emergent large vessel occlusion. 2. Moderate stenosis of the proximal left PCA P2 segment. 3. Unchanged appearance of left cerebellar infarct. 4. Aortic Atherosclerosis (ICD10-I70.0). Electronically Signed   By: Deatra Robinson M.D.   On: 11/20/2018 02:27   Mr Brain Wo Contrast  Result Date: 11/20/2018 CLINICAL DATA:  Stroke follow-up EXAM: MRI HEAD WITHOUT CONTRAST TECHNIQUE: Multiplanar, multiecho pulse sequences of the brain and surrounding structures were obtained without intravenous contrast. COMPARISON:  None. FINDINGS: BRAIN: There is a large acute infarct within the superior left cerebellar hemisphere. This extends into the left superior cerebellar peduncle. There is no mass effect. Mild edema. There is no acute hemorrhage. Multifocal white matter hyperintensity, most commonly due to chronic ischemic microangiopathy. There is generalized atrophy without lobar predilection. The midline structures are normal. VASCULAR: The major intracranial arterial and venous sinus flow voids are normal. Medial left cerebellar focus of chronic microhemorrhage. SKULL AND UPPER CERVICAL SPINE: Calvarial bone marrow signal is normal. There is no skull base mass. The visualized upper cervical spine and soft tissues are normal. SINUSES/ORBITS: There are no fluid levels or advanced mucosal thickening. The mastoid air cells and middle ear cavities are free of fluid. The orbits are normal. IMPRESSION: 1. Large acute left cerebellar infarct without hemorrhage or mass effect. 2. Moderate chronic small vessel ischemic microangiopathy. Electronically Signed   By: Deatra Robinson M.D.   On: 11/20/2018 03:12   Mr Thoracic Spine W Wo Contrast  Result Date: 11/18/2018 CLINICAL DATA:  Back pain EXAM: MRI THORACIC WITHOUT AND WITH CONTRAST TECHNIQUE: Multiplanar and multiecho pulse sequences of the thoracic spine were obtained without and with intravenous contrast. CONTRAST:  6mL GADAVIST GADOBUTROL 1 MMOL/ML IV SOLN COMPARISON:   None. FINDINGS: MRI THORACIC SPINE FINDINGS Alignment:  Physiologic. Vertebrae: No fracture, evidence of discitis, or bone lesion. Cord:  Normal signal and morphology. Paraspinal and other soft tissues: Small pleural effusions Disc levels: At T7-8, there is an intermediate sized central disc protrusion that effaces the ventral thecal sac and contacts the anterior surface of the spinal cord. There is no spinal canal stenosis. At T8-9, there is a small right subarticular disc protrusion without stenosis. At T9-10, there is a small disc osteophyte complex, greatest  in the right subarticular zone, without spinal canal stenosis. At T11-12, there is a left subarticular disc protrusion without causing stenosis. IMPRESSION: 1. No discitis-osteomyelitis or epidural abscess. 2. 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 3. Central disc protrusion at T7-8 contacting the ventral surface of the spinal cord. Electronically Signed   By: Deatra Robinson M.D.   On: 11/18/2018 21:51   Mr Lumbar Spine W Wo Contrast  Result Date: 11/18/2018 CLINICAL DATA:  Back pain. Nausea and vomiting. EXAM: MRI LUMBAR SPINE WITHOUT AND WITH CONTRAST TECHNIQUE: Multiplanar and multiecho pulse sequences of the lumbar spine were obtained without and with intravenous contrast. CONTRAST:  6mL GADAVIST GADOBUTROL 1 MMOL/ML IV SOLN COMPARISON:  None. FINDINGS: Segmentation:  Normal Alignment:  Dextroscoliosis with apex at L3 Vertebrae: No acute compression fracture, discitis-osteomyelitis, facet edema or other focal marrow lesion. No epidural collection. Conus medullaris and cauda equina: Conus extends to the L2 level. Conus and cauda equina appear normal. Paraspinal and other soft tissues: Negative. Disc levels: T12-L1: Normal disc space and facets. No spinal canal or neuroforaminal stenosis. L1-L2: Disc space narrowing with endplate spurring. No spinal canal stenosis. Severe right neural foraminal stenosis. L2-L3: Disc space  narrowing with endplate spurring. Moderate bilateral neural foraminal stenosis. Mild facet hypertrophy. No central spinal canal stenosis. L3-L4: Disc space narrowing with diffuse bulge. Moderate facet hypertrophy. Mild spinal canal stenosis with severe narrowing of the left lateral recess. Mild right and severe left neural foraminal stenosis. L4-L5: Small disc bulge with mild facet hypertrophy. No spinal canal stenosis. Mild right and severe left neural foraminal stenosis. L5-S1: Small disc bulge with severe right facet hypertrophy. There is severe right neural foraminal stenosis and narrowing of the right lateral recess. No central spinal canal or left neural foraminal stenosis. Visualized sacrum: Normal. IMPRESSION: 1. No epidural abscess or discitis-osteomyelitis. 2. 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 3. Multilevel moderate-to-severe neural foraminal stenosis. Electronically Signed   By: Deatra Robinson M.D.   On: 11/18/2018 21:44   Mr Sacrum Si Joints W Wo Contrast  Result Date: 11/18/2018 CLINICAL DATA:  Back pain.  Possible infection. EXAM: MRI PELVIS WITHOUT AND WITH CONTRAST TECHNIQUE: Multiplanar multisequence MR imaging of the pelvis was performed both before and after administration of intravenous contrast. CONTRAST:  6mL GADAVIST GADOBUTROL 1 MMOL/ML IV SOLN COMPARISON:  CT scan from yesterday. FINDINGS: The bony structures are intact. No stress fracture or bone lesion. No findings suggests sacroiliitis or septic arthritis. Mild inflammatory changes involving the right piriformis muscle and also the upper aspect of the right iliopsoas muscle. This could reflect nonspecific myositis. Both hips are normally located. Small joint effusions but no findings suspicious for septic arthritis. No evidence of avascular necrosis. No significant intrapelvic abnormalities are identified. IMPRESSION: 1. No findings suspicious for septic arthritis or  osteomyelitis involving the sacrum or SI joints or hip joints. 2. Areas of nonspecific myositis mainly involving the right piriformis muscle and the right iliopsoas muscle. 3. No significant intrapelvic abnormalities. Electronically Signed   By: Rudie Meyer M.D.   On: 11/18/2018 21:36   Vas [place] Upper Extremity Venous Duplex  Result Date: 11/20/2018 UPPER VENOUS STUDY  Indications: shoulder pain Comparison Study: no prior Performing Technologist: Jeb Levering RDMS, RVT  Examination Guidelines: A complete evaluation includes B-mode imaging, spectral Doppler, color Doppler, and power Doppler as needed of all accessible portions of each vessel. Bilateral testing is considered an integral part of a complete  examination. Limited examinations for reoccurring indications may be performed as noted.  Left Findings: +----------+------------+---------+-----------+----------+-------+  LEFT       Compressible Phasicity Spontaneous Properties Summary  +----------+------------+---------+-----------+----------+-------+  IJV            Full        Yes        Yes                         +----------+------------+---------+-----------+----------+-------+  Subclavian     Full        Yes        Yes                         +----------+------------+---------+-----------+----------+-------+  Axillary       Full        Yes        Yes                         +----------+------------+---------+-----------+----------+-------+  Brachial       Full        Yes        Yes                         +----------+------------+---------+-----------+----------+-------+  Radial         Full                                               +----------+------------+---------+-----------+----------+-------+  Ulnar          Full                                               +----------+------------+---------+-----------+----------+-------+  Cephalic       None                                       Acute    +----------+------------+---------+-----------+----------+-------+  Basilic        Full                                               +----------+------------+---------+-----------+----------+-------+  Summary:  Left: No evidence of deep vein thrombosis in the upper extremity. Findings consistent with acute superficial vein thrombosis involving the left cephalic vein.  *See table(s) above for measurements and observations.  Diagnosing physician: Coral Else MD Electronically signed by Coral Else MD on 11/20/2018 at 6:59:46 AM.    Final       Scheduled Meds:  aspirin EC  325 mg Oral Daily   atorvastatin  20 mg Oral q1800   carvedilol  12.5 mg Oral BID   feeding supplement (ENSURE ENLIVE)  237 mL Oral BID BM   influenza vaccine adjuvanted  0.5 mL Intramuscular Tomorrow-1000   levothyroxine  50 mcg Oral QAC breakfast   lidocaine  1 patch Transdermal Q24H   losartan  100 mg Oral Daily   pantoprazole  40 mg Oral  BID   senna-docusate  2 tablet Oral BID   sodium chloride flush  3 mL Intravenous Q12H   vitamin B-12  1,000 mcg Oral Daily   Continuous Infusions:  methocarbamol (ROBAXIN) IV 500 mg (11/17/18 2004)   nafcillin IV 2 g (11/20/18 1401)     LOS: 7 days      Time spent: 35 minutes   Noralee Stain, DO [facility] 11/20/2018, 2:03 PM   Available via Epic secure chat 7am-7pm After these hours, please refer to coverage provider listed on [email or web address]

## 2018-11-20 NOTE — Plan of Care (Signed)
  Problem: Safety: Goal: Ability to remain free from injury will improve 11/20/2018 0031 by Lorra Hals, RN Outcome: Progressing

## 2018-11-20 NOTE — H&P (View-Only) (Signed)
MRI completed. Images reviewed. A subacute large to medium-sized left cerebellar ischemic infarction is seen. There is no significant mass effect. No current indication for hypertonic saline.   Electronically signed: Dr. Kerney Elbe

## 2018-11-21 ENCOUNTER — Encounter (HOSPITAL_COMMUNITY): Payer: Self-pay | Admitting: Cardiology

## 2018-11-21 DIAGNOSIS — I340 Nonrheumatic mitral (valve) insufficiency: Secondary | ICD-10-CM

## 2018-11-21 DIAGNOSIS — I669 Occlusion and stenosis of unspecified cerebral artery: Secondary | ICD-10-CM

## 2018-11-21 DIAGNOSIS — I339 Acute and subacute endocarditis, unspecified: Secondary | ICD-10-CM

## 2018-11-21 LAB — CBC
HCT: 29.1 % — ABNORMAL LOW (ref 36.0–46.0)
Hemoglobin: 9.8 g/dL — ABNORMAL LOW (ref 12.0–15.0)
MCH: 33.9 pg (ref 26.0–34.0)
MCHC: 33.7 g/dL (ref 30.0–36.0)
MCV: 100.7 fL — ABNORMAL HIGH (ref 80.0–100.0)
Platelets: 155 10*3/uL (ref 150–400)
RBC: 2.89 MIL/uL — ABNORMAL LOW (ref 3.87–5.11)
RDW: 13.1 % (ref 11.5–15.5)
WBC: 11 10*3/uL — ABNORMAL HIGH (ref 4.0–10.5)
nRBC: 0 % (ref 0.0–0.2)

## 2018-11-21 LAB — BASIC METABOLIC PANEL
Anion gap: 13 (ref 5–15)
BUN: 26 mg/dL — ABNORMAL HIGH (ref 8–23)
CO2: 21 mmol/L — ABNORMAL LOW (ref 22–32)
Calcium: 8.2 mg/dL — ABNORMAL LOW (ref 8.9–10.3)
Chloride: 96 mmol/L — ABNORMAL LOW (ref 98–111)
Creatinine, Ser: 0.92 mg/dL (ref 0.44–1.00)
GFR calc Af Amer: >60 mL/min (ref >60)
GFR calc non Af Amer: 57 mL/min — ABNORMAL LOW (ref >60)
Glucose, Bld: 146 mg/dL — ABNORMAL HIGH (ref 70–99)
Potassium: 3.7 mmol/L (ref 3.5–5.1)
Sodium: 130 mmol/L — ABNORMAL LOW (ref 135–145)

## 2018-11-21 MED ORDER — SODIUM CHLORIDE 0.9 % IV BOLUS
1000.0000 mL | Freq: Once | INTRAVENOUS | Status: AC
  Administered 2018-11-21: 1000 mL via INTRAVENOUS

## 2018-11-21 NOTE — Progress Notes (Addendum)
Subjective: Patient reports "yes", when asked if she feels ok  Objective: Vital signs in last 24 hours: Temp:  [97.5 F (36.4 C)-99.4 F (37.4 C)] 97.5 F (36.4 C) (10/08 0727) Pulse Rate:  [66-93] 68 (10/08 0727) Resp:  [12-18] 16 (10/08 0727) BP: (105-134)/(36-64) 113/48 (10/08 0727) SpO2:  [93 %-100 %] 95 % (10/08 0727) Weight:  [65.5 kg-71.9 kg] 71.9 kg (10/08 0554)  Intake/Output from previous day: 10/07 0701 - 10/08 0700 In: 1201.8 [P.O.:430; I.V.:621.8; IV Piggyback:150] Out: 300 [Urine:300] Intake/Output this shift: No intake/output data recorded.  Awake, alert, smiling. Aswers questions but offers no conversation. Acknowledges persistent lumbar and left shoulder pain when asked, but offers no complaints. Now followed by ID and CT Surgery for bacteremia and mitral valve vegetation.  Lab Results: Recent Labs    11/20/18 0303 11/21/18 0327  WBC 12.3* 11.0*  HGB 10.0* 9.8*  HCT 29.2* 29.1*  PLT 180 155   BMET Recent Labs    11/20/18 0303 11/21/18 0327  NA 127* 130*  K 4.0 3.7  CL 94* 96*  CO2 22 21*  GLUCOSE 156* 146*  BUN 22 26*  CREATININE 0.85 0.92  CALCIUM 8.5* 8.2*    Studies/Results: Ct Angio Head W Or Wo Contrast  Result Date: 11/20/2018 CLINICAL DATA:  Stroke follow-up EXAM: CT ANGIOGRAPHY HEAD AND NECK TECHNIQUE: Multidetector CT imaging of the head and neck was performed using the standard protocol during bolus administration of intravenous contrast. Multiplanar CT image reconstructions and MIPs were obtained to evaluate the vascular anatomy. Carotid stenosis measurements (when applicable) are obtained utilizing NASCET criteria, using the distal internal carotid diameter as the denominator. CONTRAST:  75mL OMNIPAQUE IOHEXOL 350 MG/ML SOLN COMPARISON:  None. FINDINGS: CTA NECK FINDINGS SKELETON: There is no bony spinal canal stenosis. No lytic or blastic lesion. OTHER NECK: Normal pharynx, larynx and major salivary glands. No cervical lymphadenopathy.  Unremarkable thyroid gland. UPPER CHEST: Biapical scarring AORTIC ARCH: There is mild calcific atherosclerosis of the aortic arch. There is no aneurysm, dissection or hemodynamically significant stenosis of the visualized portion of the aorta. Conventional 3 vessel aortic branching pattern. There is approximately 50% stenosis of the proximal left subclavian artery, which is otherwise normal. Right subclavian artery visualized portion is normal. RIGHT CAROTID SYSTEM: No dissection, occlusion or aneurysm. Mild atherosclerotic calcification at the carotid bifurcation without hemodynamically significant stenosis. LEFT CAROTID SYSTEM: Normal without aneurysm, dissection or stenosis. VERTEBRAL ARTERIES: Left dominant configuration. Both origins are clearly patent. There is no dissection, occlusion or flow-limiting stenosis to the skull base (V1-V3 segments). CTA HEAD FINDINGS POSTERIOR CIRCULATION: --Vertebral arteries: Normal V4 segments. --Posterior inferior cerebellar arteries (PICA): Patent origins from the vertebral arteries. --Anterior inferior cerebellar arteries (AICA): Patent origins from the basilar artery. --Basilar artery: Diminutive basilar artery --Superior cerebellar arteries: Normal. --Posterior cerebral arteries: Both are predominantly supplied by the posterior communicating arteries (p-comm). Moderate stenosis of the proximal left P2 segment (series 11, image 20). ANTERIOR CIRCULATION: --Intracranial internal carotid arteries: Normal. --Anterior cerebral arteries (ACA): Normal. Both A1 segments are present. Patent anterior communicating artery (a-comm). --Middle cerebral arteries (MCA): Normal. VENOUS SINUSES: As permitted by contrast timing, patent. ANATOMIC VARIANTS: None Review of the MIP images confirms the above findings. IMPRESSION: 1. No emergent large vessel occlusion. 2. Moderate stenosis of the proximal left PCA P2 segment. 3. Unchanged appearance of left cerebellar infarct. 4. Aortic  Atherosclerosis (ICD10-I70.0). Electronically Signed   By: Deatra RobinsonKevin  Herman M.D.   On: 11/20/2018 02:27   Ct Head Wo Contrast  Result Date: 11/19/2018 CLINICAL DATA:  85 year old with acute encephalopathy. She has Staph aureus sepsis/bacteremia. EXAM: CT HEAD WITHOUT CONTRAST TECHNIQUE: Contiguous axial images were obtained from the base of the skull through the vertex without intravenous contrast. COMPARISON:  None. FINDINGS: Brain: Ventricular system normal in size and appearance for age. Mild age-appropriate cortical atrophy. Geographic low attenuation involving the LEFT superior cerebellar hemisphere extending into the LEFT cerebellar peduncle. No associated hemorrhage. Mild changes of small vessel disease of the white matter diffusely. No acute hemorrhage or hematoma. No extra-axial fluid collections. Vascular: Moderate BILATERAL carotid siphon and LEFT vertebral artery atherosclerosis. No hyperdense vessel. Skull: No skull fracture or other focal osseous abnormality involving the skull. Sinuses/Orbits: Visualized paranasal sinuses, bilateral mastoid air cells and bilateral middle ear cavities well-aerated. Visualized orbits and globes normal in appearance. Other: None. IMPRESSION: 1. Acute/subacute nonhemorrhagic stroke involving the LEFT superior cerebellar hemisphere extending into the LEFT cerebellar peduncle. 2. Mild age-appropriate cortical atrophy and mild chronic microvascular ischemic changes of the white matter. These results will be called to the ordering clinician or representative by the Radiologist Assistant, and communication documented in the PACS or zVision Dashboard. Electronically Signed   By: Evangeline Dakin M.D.   On: 11/19/2018 14:50   Ct Angio Neck W Or Wo Contrast  Result Date: 11/20/2018 CLINICAL DATA:  Stroke follow-up EXAM: CT ANGIOGRAPHY HEAD AND NECK TECHNIQUE: Multidetector CT imaging of the head and neck was performed using the standard protocol during bolus administration  of intravenous contrast. Multiplanar CT image reconstructions and MIPs were obtained to evaluate the vascular anatomy. Carotid stenosis measurements (when applicable) are obtained utilizing NASCET criteria, using the distal internal carotid diameter as the denominator. CONTRAST:  78mL OMNIPAQUE IOHEXOL 350 MG/ML SOLN COMPARISON:  None. FINDINGS: CTA NECK FINDINGS SKELETON: There is no bony spinal canal stenosis. No lytic or blastic lesion. OTHER NECK: Normal pharynx, larynx and major salivary glands. No cervical lymphadenopathy. Unremarkable thyroid gland. UPPER CHEST: Biapical scarring AORTIC ARCH: There is mild calcific atherosclerosis of the aortic arch. There is no aneurysm, dissection or hemodynamically significant stenosis of the visualized portion of the aorta. Conventional 3 vessel aortic branching pattern. There is approximately 50% stenosis of the proximal left subclavian artery, which is otherwise normal. Right subclavian artery visualized portion is normal. RIGHT CAROTID SYSTEM: No dissection, occlusion or aneurysm. Mild atherosclerotic calcification at the carotid bifurcation without hemodynamically significant stenosis. LEFT CAROTID SYSTEM: Normal without aneurysm, dissection or stenosis. VERTEBRAL ARTERIES: Left dominant configuration. Both origins are clearly patent. There is no dissection, occlusion or flow-limiting stenosis to the skull base (V1-V3 segments). CTA HEAD FINDINGS POSTERIOR CIRCULATION: --Vertebral arteries: Normal V4 segments. --Posterior inferior cerebellar arteries (PICA): Patent origins from the vertebral arteries. --Anterior inferior cerebellar arteries (AICA): Patent origins from the basilar artery. --Basilar artery: Diminutive basilar artery --Superior cerebellar arteries: Normal. --Posterior cerebral arteries: Both are predominantly supplied by the posterior communicating arteries (p-comm). Moderate stenosis of the proximal left P2 segment (series 11, image 20). ANTERIOR  CIRCULATION: --Intracranial internal carotid arteries: Normal. --Anterior cerebral arteries (ACA): Normal. Both A1 segments are present. Patent anterior communicating artery (a-comm). --Middle cerebral arteries (MCA): Normal. VENOUS SINUSES: As permitted by contrast timing, patent. ANATOMIC VARIANTS: None Review of the MIP images confirms the above findings. IMPRESSION: 1. No emergent large vessel occlusion. 2. Moderate stenosis of the proximal left PCA P2 segment. 3. Unchanged appearance of left cerebellar infarct. 4. Aortic Atherosclerosis (ICD10-I70.0). Electronically Signed   By: Ulyses Jarred M.D.   On: 11/20/2018 02:27  Mr Brain Wo Contrast  Result Date: 11/20/2018 CLINICAL DATA:  Stroke follow-up EXAM: MRI HEAD WITHOUT CONTRAST TECHNIQUE: Multiplanar, multiecho pulse sequences of the brain and surrounding structures were obtained without intravenous contrast. COMPARISON:  None. FINDINGS: BRAIN: There is a large acute infarct within the superior left cerebellar hemisphere. This extends into the left superior cerebellar peduncle. There is no mass effect. Mild edema. There is no acute hemorrhage. Multifocal white matter hyperintensity, most commonly due to chronic ischemic microangiopathy. There is generalized atrophy without lobar predilection. The midline structures are normal. VASCULAR: The major intracranial arterial and venous sinus flow voids are normal. Medial left cerebellar focus of chronic microhemorrhage. SKULL AND UPPER CERVICAL SPINE: Calvarial bone marrow signal is normal. There is no skull base mass. The visualized upper cervical spine and soft tissues are normal. SINUSES/ORBITS: There are no fluid levels or advanced mucosal thickening. The mastoid air cells and middle ear cavities are free of fluid. The orbits are normal. IMPRESSION: 1. Large acute left cerebellar infarct without hemorrhage or mass effect. 2. Moderate chronic small vessel ischemic microangiopathy. Electronically Signed   By:  Deatra Robinson M.D.   On: 11/20/2018 03:12   Vas [place] Upper Extremity Venous Duplex  Result Date: 11/20/2018 UPPER VENOUS STUDY  Indications: shoulder pain Comparison Study: no prior Performing Technologist: Jeb Levering RDMS, RVT  Examination Guidelines: A complete evaluation includes B-mode imaging, spectral Doppler, color Doppler, and power Doppler as needed of all accessible portions of each vessel. Bilateral testing is considered an integral part of a complete examination. Limited examinations for reoccurring indications may be performed as noted.  Left Findings: +----------+------------+---------+-----------+----------+-------+ LEFT      CompressiblePhasicitySpontaneousPropertiesSummary +----------+------------+---------+-----------+----------+-------+ IJV           Full       Yes       Yes                      +----------+------------+---------+-----------+----------+-------+ Subclavian    Full       Yes       Yes                      +----------+------------+---------+-----------+----------+-------+ Axillary      Full       Yes       Yes                      +----------+------------+---------+-----------+----------+-------+ Brachial      Full       Yes       Yes                      +----------+------------+---------+-----------+----------+-------+ Radial        Full                                          +----------+------------+---------+-----------+----------+-------+ Ulnar         Full                                          +----------+------------+---------+-----------+----------+-------+ Cephalic      None  Acute  +----------+------------+---------+-----------+----------+-------+ Basilic       Full                                          +----------+------------+---------+-----------+----------+-------+  Summary:  Left: No evidence of deep vein thrombosis in the upper extremity. Findings consistent with  acute superficial vein thrombosis involving the left cephalic vein.  *See table(s) above for measurements and observations.  Diagnosing physician: Coral Else MD Electronically signed by Coral Else MD on 11/20/2018 at 6:59:46 AM.    Final     Assessment/Plan:   LOS: 8 days  Supportive care continues. Therapies as tolerated.   Georgiann Cocker 11/21/2018, 7:50 AM   Endocarditis with CVA.  Supportive care.

## 2018-11-21 NOTE — Anesthesia Postprocedure Evaluation (Signed)
Anesthesia Post Note  Patient: Autumn [name]  Procedure(s) Performed: TRANSESOPHAGEAL ECHOCARDIOGRAM (TEE) (N/A )     Patient location during evaluation: Endoscopy Anesthesia Type: MAC Level of consciousness: awake and alert Pain management: pain level controlled Vital Signs Assessment: post-procedure vital signs reviewed and stable Respiratory status: spontaneous breathing, nonlabored ventilation, respiratory function stable and patient connected to nasal cannula oxygen Cardiovascular status: blood pressure returned to baseline and stable Postop Assessment: no apparent nausea or vomiting Anesthetic complications: no    Last Vitals:  Vitals:   11/21/18 0652 11/21/18 0653  BP: (!) 114/56   Pulse: 68 68  Resp: 12   Temp: 36.9 C   SpO2: 93% 93%    Last Pain:  Vitals:   11/21/18 0652  TempSrc: Axillary  PainSc:                  Lizania Bouchard L Hallie Ertl

## 2018-11-21 NOTE — Consult Note (Signed)
[address]       [place] [address]             [phone]        NEVIAH BRAUD [facility] Medical Record #[id number] Date of Birth: 1932/04/25  Referring: No ref. provider found Primary Care: Merlene Laughter, MD Primary Cardiologist:James Hochrein, MD  Chief Complaint:    Chief Complaint  Patient presents with  . Nausea    History of Present Illness:     85 yo female is admitted to the hospital with malaise and weakness.  She was noted to have positive blood cultures for MSSA, and an acute left cerebellar CVA.  TEE yesterday showed a small 3.39mm mobile echodensity on the posterior mitral leaflet.  CTS has been consulted to assist with management.     Past Medical and Surgical History: Previous Chest Surgery: no Previous Chest Radiation: no Diabetes Mellitus: no.  HbA1C 5.9 Creatinine: 0.92  Past Medical History:  Diagnosis Date  . Anxiety   . GERD (gastroesophageal reflux disease)   . Hypertension   . Hypothyroid   . LBBB (left bundle branch block)   . MRSA bacteremia 11/19/2018  . Osteopenia   . Peripheral neuropathy   . Venous insufficiency     Past Surgical History:  Procedure Laterality Date  . ABDOMINAL HYSTERECTOMY    . BLADDER SURGERY    . LEFT HEART CATHETERIZATION WITH CORONARY ANGIOGRAM N/A 09/02/2013   Procedure: LEFT HEART CATHETERIZATION WITH CORONARY ANGIOGRAM;  Surgeon: [name], MD;  Location: [facility] CATH LAB;  Service: Cardiovascular;  Laterality: N/A;  . RENAL ANGIOGRAM N/A 09/02/2013   Procedure: RENAL ANGIOGRAM;  Surgeon: [name], MD;  Location: [facility] CATH LAB;  Service: Cardiovascular;  Laterality: N/A;  . TEE WITHOUT CARDIOVERSION N/A 11/20/2018   Procedure: TRANSESOPHAGEAL ECHOCARDIOGRAM (TEE);  Surgeon: Lars Masson, MD;  Location: [facility] ENDOSCOPY;  Service: Cardiovascular;  Laterality: N/A;    Social History: Support: son at the bedside.  Lives in assisted living facility  Social History   Tobacco Use   Smoking Status Never Smoker  Smokeless Tobacco Never Used    Social History   Substance and Sexual Activity  Alcohol Use Yes  . Frequency: Never   Comment: Once every three months      Allergies  Allergen Reactions  . Hydrochlorothiazide Other (See Comments)    REACTION: hyponatremia  . Ramipril Other (See Comments)    REACTION: GI upset  . Remeron [Mirtazapine] Other (See Comments)    REACTION: weakness  . Sulfa Antibiotics Rash      Current Facility-Administered Medications  Medication Dose Route Frequency Provider Last Rate Last Dose  . acetaminophen (TYLENOL) tablet 650 mg  650 mg Oral Q6H PRN Lars Masson, MD   650 mg at 11/19/18 1353   Or  . acetaminophen (TYLENOL) suppository 650 mg  650 mg Rectal Q6H PRN Lars Masson, MD      . albuterol (PROVENTIL) (2.5 MG/3ML) 0.083% nebulizer solution 2.5 mg  2.5 mg Nebulization Q6H PRN Lars Masson, MD      . alum & mag hydroxide-simeth (MAALOX/MYLANTA) 200-200-20 MG/5ML suspension 30 mL  30 mL Oral Q6H PRN Lars Masson, MD   30 mL at 11/13/18 0030  . aspirin EC tablet 325 mg  325 mg Oral Daily Lars Masson, MD   325 mg at 11/21/18 0177  . atorvastatin (LIPITOR) tablet 20 mg  20 mg Oral q1800 Lars Masson,  MD   20 mg at 11/20/18 1616  . carvedilol (COREG) tablet 12.5 mg  12.5 mg Oral BID Lars MassonNelson, Katarina H, MD   12.5 mg at 11/21/18 0904  . diclofenac sodium (VOLTAREN) 1 % transdermal gel 4 g  4 g Topical QID PRN Lars MassonNelson, Katarina H, MD      . feeding supplement (ENSURE ENLIVE) (ENSURE ENLIVE) liquid 237 mL  237 mL Oral BID BM Lars MassonNelson, Katarina H, MD   237 mL at 11/21/18 0905  . HYDROcodone-acetaminophen (NORCO/VICODIN) 5-325 MG per tablet 1 tablet  1 tablet Oral Q6H PRN Lars MassonNelson, Katarina H, MD   1 tablet at 11/21/18 0555  . influenza vaccine adjuvanted (FLUAD) injection 0.5 mL  0.5 mL Intramuscular Tomorrow-1000 Hall, Carole N, DO      . labetalol (NORMODYNE) injection 5 mg  5 mg Intravenous Q4H PRN  Lars MassonNelson, Katarina H, MD      . levothyroxine (SYNTHROID) tablet 50 mcg  50 mcg Oral QAC breakfast Lars MassonNelson, Katarina H, MD   50 mcg at 11/21/18 0505  . lidocaine (LIDODERM) 5 % 1 patch  1 patch Transdermal Q24H Lars MassonNelson, Katarina H, MD   1 patch at 11/20/18 1107  . LORazepam (ATIVAN) tablet 0.5 mg  0.5 mg Oral QHS PRN Lars MassonNelson, Katarina H, MD   0.5 mg at 11/21/18 0127  . losartan (COZAAR) tablet 100 mg  100 mg Oral Daily Lars MassonNelson, Katarina H, MD   100 mg at 11/21/18 0904  . methocarbamol (ROBAXIN) 500 mg in dextrose 5 % 50 mL IVPB  500 mg Intravenous Q8H PRN Lars MassonNelson, Katarina H, MD 100 mL/hr at 11/17/18 2004 500 mg at 11/17/18 2004  . nafcillin 2 g in sodium chloride 0.9 % 100 mL IVPB  2 g Intravenous Q4H Lars MassonNelson, Katarina H, MD 200 mL/hr at 11/21/18 0912 2 g at 11/21/18 0912  . naloxone [facility](NARCAN) injection 0.4 mg  0.4 mg Intravenous PRN Lars MassonNelson, Katarina H, MD      . pantoprazole (PROTONIX) EC tablet 40 mg  40 mg Oral BID Lars MassonNelson, Katarina H, MD   40 mg at 11/21/18 0904  . senna-docusate (Senokot-S) tablet 2 tablet  2 tablet Oral BID Lars MassonNelson, Katarina H, MD   2 tablet at 11/21/18 [phone]  . sodium chloride flush (NS) 0.9 % injection 3 mL  3 mL Intravenous Q12H Lars MassonNelson, Katarina H, MD   3 mL at 11/21/18 0905  . traMADol (ULTRAM) tablet 50 mg  50 mg Oral Q6H PRN Lars MassonNelson, Katarina H, MD   50 mg at 11/21/18 0127  . vitamin B-12 (CYANOCOBALAMIN) tablet 1,000 mcg  1,000 mcg Oral Daily Lars MassonNelson, Katarina H, MD   1,000 mcg at 11/21/18 [id number]    Medications Prior to Admission  Medication Sig Dispense Refill Last Dose  . Ascorbic Acid (VITAMIN C PO) Take 1 tablet by mouth daily.   11/11/2018 at Unknown time  . carvedilol (COREG) 6.25 MG tablet Take 1 tablet (6.25 mg total) by mouth 2 (two) times daily. 180 tablet 3 11/11/2018 at 1900  . Cholecalciferol (VITAMIN D PO) Take 1 tablet by mouth daily.   Past Week at Unknown time  . Cyanocobalamin (VITAMIN B-12 PO) Take 1 tablet by mouth daily.   11/11/2018 at Unknown time  . diclofenac  sodium (VOLTAREN) 1 % GEL Apply 4 g topically 4 (four) times daily. (Patient taking differently: Apply 4 g topically 4 (four) times daily as needed (pain). ) 100 g 1 Past Month at Unknown time  . estradiol (ESTRACE) 1 MG tablet Take  1 mg by mouth daily.   11/11/2018 at Unknown time  . HYDROcodone-acetaminophen (NORCO/VICODIN) 5-325 MG per tablet Take 1 tablet by mouth daily as needed for moderate pain.   Past Month at Unknown time  . levothyroxine (SYNTHROID, LEVOTHROID) 50 MCG tablet Take 50 mcg by mouth daily before breakfast.   11/11/2018 at Unknown time  . losartan (COZAAR) 50 MG tablet Take 50 mg by mouth daily.   11/11/2018 at Unknown time  . MELATONIN PO Take 1 mL by mouth at bedtime.   11/11/2018 at Unknown time  . Multiple Vitamins-Minerals (HAIR SKIN AND NAILS FORMULA PO) Take 1 tablet by mouth daily.   11/11/2018 at Unknown time  . omeprazole (PRILOSEC) 20 MG capsule Take 20 mg by mouth daily as needed (for reflux).   Past Month at Unknown time  . TURMERIC CURCUMIN PO Take 1 tablet by mouth daily.    11/11/2018 at Unknown time  . [DISCONTINUED] LORazepam (ATIVAN) 0.5 MG tablet Take 0.5 mg by mouth at bedtime as needed for sleep.    Past Month at Unknown time    Family History  Problem Relation Age of Onset  . Hypertension Mother   . Prostate cancer Father      Review of Systems:   Review of Systems  Constitutional: Positive for chills, fever and malaise/fatigue.  HENT: Negative.   Eyes: Negative.   Respiratory: Negative.   Cardiovascular: Negative for chest pain, palpitations and orthopnea.  Gastrointestinal: Positive for nausea and vomiting.  Musculoskeletal: Positive for back pain and myalgias.  Skin: Negative.   Neurological: Positive for dizziness and weakness.      Physical Exam: BP (!) 148/67 (BP Location: Left Arm)   Pulse 74   Temp 97.8 F (36.6 C) (Oral)   Resp 20   Ht 5\' 3"  (1.6 m)   Wt 71.9 kg   SpO2 94%   BMI 28.08 kg/m  Physical Exam  Constitutional: No  distress.  Ill appearing.    HENT:  Head: Normocephalic and atraumatic.  Eyes: Conjunctivae and EOM are normal. No scleral icterus.  Neck: No tracheal deviation present.  Cardiovascular: Normal rate, regular rhythm and normal heart sounds.  No murmur heard. Pulmonary/Chest: Effort normal. No respiratory distress.  Abdominal: Soft. She exhibits no distension.  Skin: Skin is warm and dry. She is not diaphoretic.      Diagnostic Studies & Laboratory data:    Echo: TEE 11/20/18 1. Left ventricular ejection fraction, by visual estimation, is 55 to 60%. The left ventricle has normal function. Normal left ventricular size. There is no left ventricular hypertrophy.  2. Global right ventricle has normal systolic function.The right ventricular size is normal. No increase in right ventricular wall thickness.  3. Left atrial size was normal.  4. Right atrial size was normal.  5. Moderate thickening of the mitral valve leaflet(s).  6. Small vegetation on the mitral valve.  7. The mitral valve is normal in structure. Mild mitral valve regurgitation. No evidence of mitral stenosis.  8. The tricuspid valve is normal in structure. Tricuspid valve regurgitation moderate.  9. The aortic valve is normal in structure. Aortic valve regurgitation is trivial by color flow Doppler. Mild to moderate aortic valve sclerosis/calcification without any evidence of aortic stenosis. 10. The pulmonic valve was normal in structure. Pulmonic valve regurgitation is not visualized by color flow Doppler. 11. Normal pulmonary artery systolic pressure. 12. The inferior vena cava is normal in size with greater than 50% respiratory variability, suggesting right atrial pressure  of 3 mmHg. 13. There is a very small mobile echodensity attached to the posterior mitral valve leaflet measuring 3.5 x 2 mm. While this might represent a degenerative process of the leaflet, in the settings of bacteremia it is highly suspicious of a  vegetation.  Conservative therapy with antibiotics is recommended.   I have independently reviewed the above radiologic studies and discussed with the patient   Recent Lab Findings: Lab Results  Component Value Date   WBC 11.0 (H) 11/21/2018   HGB 9.8 (L) 11/21/2018   HCT 29.1 (L) 11/21/2018   PLT 155 11/21/2018   GLUCOSE 146 (H) 11/21/2018   CHOL 178 11/19/2018   TRIG 114 11/19/2018   HDL 66 11/19/2018   LDLCALC 89 11/19/2018   ALT 23 11/18/2018   AST 37 11/18/2018   NA 130 (L) 11/21/2018   K 3.7 11/21/2018   CL 96 (L) 11/21/2018   CREATININE 0.92 11/21/2018   BUN 26 (H) 11/21/2018   CO2 21 (L) 11/21/2018   TSH 0.778 11/12/2018   INR 0.9 08/28/2013   HGBA1C 5.9 (H) 11/19/2018      Assessment / Plan:   85 yo female with MSSA endocarditis, L cerebellar CVA, which is likely embolic.  On review of the echo, the echodensity is small, and there is no significant valvular disease.  The prohibiting factor in this case is the CVA, which would convert to a hemorrhagic stroke if she were to undergo surgery.  Additionally, she appears quite frail from this event.  No surgery at this time.  Continue medical therapy.  Will follow peripherally.     I  spent 40 minutes counseling the patient face to face.   Lajuana Matte 11/21/2018 12:41 PM

## 2018-11-21 NOTE — Progress Notes (Addendum)
   INFECTIOUS DISEASE ATTENDING ADDENDUM:   This patient has been seen and discussed with the house staff. Please see the resident's note for complete details. I have reviewed the pertinent  laboratory and radiographic data and examined the patient independently.  I concur with their findings with the following additions/corrections:  I came to see the patient later in the day and met with the husband and also with Dr. Kipp Brood as he was coming to see the patient.  Husband is concerned of the patient seems to have worsened versus the prior day.  We will proceed with antibiotics that penetrate the central nervous system and continue to closely monitor.  Hopefully her blood cultures clear.  Also importantly hopefully her neurological condition improves.  She did awaken towards the end of my visit talking to the husband and also with Dr. Kipp Brood.  Endocarditis Evaluation   Infective endocarditis (IE) is associated with a high rate of complications and mortality.  Specific aspects of clinical management are critical to optimizing the outcome of patients with IE. Therefore, the Pharmacy Residency Program at [facility] has initiated an evidence-based consult aimed at improving the management of IE at [facility].     Comments  Consult to ID Outpatient follow-up appointment to be made prior to discharge.  Utilization of ID recommended antibiotic therapy   Surgery Evaluation Cardiothoracic surgery consult if the following indications are present:  - Heart failure associated to valve dysfunction - Endocarditis complicated by heart block or aortic abscess - Left-sided endocarditis caused by S. aureus, fungal, or multi-drug resistant organisms - Left-sided endocarditis with severe valve dysfunction - Large vegetation (>1 cm) on aortic or mitral valve - Persistence of infection 5-7 days after initiation of appropriate antibiotic therapy - Recurrent emboli and persistent vegetations despite  appropriate antibiotic therapy - Any history with left-sided embolization with persistent vegetations on TEE  Consult electrophysiologist if presence of implanted cardiac device (pacemaker, ICD)   Consult to cardiology if left ventricular ejection fraction is <40% Evaluation for appropriate heart failure medications upon discharge: - ACE/ARB - Diuretic - Beta blocker  Outpatient follow-up appointment to be made prior to discharge.   Based on the ID MD's evaluation of this patient, the following are recommended to complete a thorough evaluation and care plan to reflect guideline-recommended therapy.  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

## 2018-11-21 NOTE — Progress Notes (Signed)
PROGRESS NOTE    Autumn [name]  NOB:[id number] DOB: March 19, 1932 DOA: 11/12/2018 PCP: Lajean Manes, MD     Brief Narrative:  Autumn [name] a 85 y.o.femalewith medical history significant ofhypertension, hypothyroidism,LBBB,arthritis,andGERD;who presents with complaints of nausea and vomiting. Autumn [name] began feeling lightheaded as though she may pass out and was unable to walk while brushing her teeth. She was able to sit down on the toilet in the bathroom and became nauseated and vomited.Associated symptoms include complaints of generalized malaise, shortness of breath, achy neck, and achy back pain. She reports that the back and neck pain complaints are not new and related to her history of arthritis. She has not had any significant cough, fever, headache, change in vision, focal weakness, dysuria, or change in bowel habits.  ED Course:Upon admission into the emergency department patient was seen to be afebrile, pulse 80-106, respirations 15-30, blood pressures 137/94-172/74, and O2 saturation maintained on room air. Orthostatic vital signs were noted to be positive after standing for 3 minutes. Labs significant for MCV 103.4, MCH 34.4, sodium 131, chloride 97, CO2 18, BUN 21, creatinine 1.01, anion gap 16,lactic acid 4.6->3.8, and troponin42->76. CXRshowed no acute abnormality and UA negative for any signs of infection.Blood cultures were ordered. She was started on normal saline IV fluids at 100 mL/h.She has had no more nausea or vomiting since admission,but still does not feel well.  Hospital course complicated by elevated troponin, abnormal 2D echo with LVEF 25%, small to moderate pericardial effusion.  Seen by cardiology and patient declined aggressive cardiac work-up per cardiology.  Persistent abdominal discomfort. States up to 2 weeks prior to presentation was taking ibuprofen daily for months. Unintentional weight loss 5 lbs in the last 4-8 weeks. Seen by GI,  declined EGD per GI. Due to continued complaints of severe back pain, patient underwent MRI lumbar, thoracic spine, sacrum.  Neurosurgery consulted.  Blood cultures also resulted with staph aureus.  Patient underwent CT head which revealed left acute/subacute cerebellar ischemic infarct.  Neurology consulted.  New events last 24 hours / Subjective: No new complaints or issues reported overnight.   Assessment & Plan:   Principal Problem:   Cerebral embolism with cerebral infarction Active Problems:   Nausea and vomiting   Bilateral hydronephrosis   Elevated troponin   Lactic acidosis   Chronic neck and back pain   Essential hypertension   Tachypnea   Postural dizziness with near syncope   Pain of upper abdomen   MSSA bacteremia   Septic embolism (HCC)   Endocarditis of mitral valve   Acute ischemic left cerebellar CVA -CT head without contrast revealed acute/subacute nonhemorrhagic stroke involving the left superior cerebellar hemisphere extending into the left cerebellar peduncle  -MRI brain revealed large acute left cerebellar infarct without hemorrhage or mass-effect -Continue aspirin 325mg  daily and lipitor  -Neurology signed off 10/7, follow up in office in 4 weeks   Sepsis secondary to MSSA bacteremia, and endocarditis -Not POA -ID following -Nafcillin IV  -TEE today revealed posterior mitral valve echodensity suspicious of vegetation -Cardiothoracic surgery consulted  -Repeat blood cultures pending -Hold off on PICC for now, until ok with ID   Intractable nausea and abdominal pain, unclear etiology -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 Seen by vascular surgery,unlikely reason for symptoms -Self-reported daily use of NSAID, ibuprofenformonths and 5 pound unintentional weight loss the past 4 to8weeks. Stop NSAIDS.  -Seen by GI but patient declined EGD, GI signed off 10/1. Follow-up with GI outpatient -Continue Protonix  40 mg twice  daily x6 weeks then 40 mg daily afterwards as recommended by GI  Intractable lower back pain in the setting of chronic back pain likely multifactorial secondary to severe stenosis at L3-L4 and MSSA bacteremia -MRI thoracic, lumbar, and sacrum done on 11/18/2018 showed severe stenosis at L3-L4 with disc bulge and central disc protrusion at T7-8. -Neurosurgery following   Acute on chronic combined systolic and diastolic CHF/small to moderate pericardial effusion. -Seen by Dr.Hochrein,per review of medical records on 04/10/2014 she had an ejection fraction of 35%,had a left heart cath in September 02, 2013 with history of nonischemic cardiomyopathy -2D echo done on 11/13/2018 showed LVEF 25% with diffuse hypokinesis and septal lateral dyssynchrony consistent with LBBB as well as grade 1 diastolic dysfunction -Seen by cardiology, patient declines aggressive cardiac work-up. Cardiology signed off 10/2 -Follow-up with Cardiology APP in 2 weeks, and Dr. Antoine PocheHochrein in 1-2 months  -Carvedilol 12.5 mg twice daily, losartan 100 mg daily, Lasix 20 mg as needed at discharge -Repeat echocardiogram in 3 to 6 months  Generalized weakness with physical debility likely in the setting of acute illness -PT OT recommended SNF  Essential hypertension complicated by orthostatic hypotension -No diuretics due to orthostatic hypotension -Euvolemic on exam -Normal BNP 65 on 11/12/2018 -Continue coreg, cozaar   Elevated troponin, likely demand ischemia -High-sensitivity troponin peaked at 196 and trended down -Seen by cardiology, no plan for further ischemic work-up; patient declines aggressive cardiac work-up. -Repeated troponin on 11/18/18, 47 and trending down  HLD -Continue lipitor   Chronic euvolemic hyponatremia -Asymptomatic -Appears to have had hyponatremia since 2018  Resolved urinary retention with bilateral hydronephrosis -Follow-up with urology; obtain referral from your primary care  provider  Hypothyroidism  -Continue levothyroxine  GERD -Continue Protonix  Acute superficial vein thrombosis involving the left cephalic vein -Supportive care    DVT prophylaxis: SCD Code Status: DNR Family Communication: Discussed with son over the phone today Disposition Plan: Pending further clinical improvement. SNF placement pending.    Consultants:   Vascular surgery  Cardiology  GI  Neurosurgery  Infectious disease  Neurology  Cardiothoracic surgery    Antimicrobials:  Anti-infectives (From admission, onward)   Start     Dose/Rate Route Frequency Ordered Stop   11/20/18 1115  vancomycin (VANCOCIN) IVPB 750 mg/150 ml premix  Status:  Discontinued     750 mg 150 mL/hr over 60 Minutes Intravenous Every 24 hours 11/19/18 1057 11/20/18 1010   11/20/18 1100  nafcillin 2 g in sodium chloride 0.9 % 100 mL IVPB     2 g 200 mL/hr over 30 Minutes Intravenous Every 4 hours 11/20/18 1010     11/19/18 1115  vancomycin (VANCOCIN) 1,750 mg in sodium chloride 0.9 % 500 mL IVPB     1,750 mg 250 mL/hr over 120 Minutes Intravenous  Once 11/19/18 1042 11/19/18 1419       Objective: Vitals:   11/21/18 0554 11/21/18 0652 11/21/18 0653 11/21/18 0727  BP:  (!) 114/56  (!) 113/48  Pulse:  68 68 68  Resp:  12  16  Temp:  98.5 F (36.9 C)  (!) 97.5 F (36.4 C)  TempSrc:  Axillary  Oral  SpO2:  93% 93% 95%  Weight: 71.9 kg     Height:        Intake/Output Summary (Last 24 hours) at 11/21/2018 1040 Last data filed at 11/20/2018 1630 Gross per 24 hour  Intake 1201.8 ml  Output 300 ml  Net 901.8 ml  Filed Weights   11/20/18 2054 11/21/18 0300 11/21/18 0554  Weight: 65.5 kg 65.5 kg 71.9 kg    Examination: General exam: Appears calm and comfortable  Respiratory system: Clear to auscultation. Respiratory effort normal. Cardiovascular system: S1 & S2 heard, RRR. No JVD, murmurs, rubs, gallops or clicks. No pedal edema. Gastrointestinal system: Abdomen is  nondistended, soft and nontender. No organomegaly or masses felt. Normal bowel sounds heard. Central nervous system: Alert and oriented. No focal neurological deficits. Extremities: Symmetric 5 x 5 power. Skin: No rashes, lesions or ulcers Psychiatry: Judgement and insight appear normal. Mood & affect flat.     Data Reviewed: I have personally reviewed following labs and imaging studies  CBC: Recent Labs  Lab 11/17/18 1750 11/18/18 0531 11/20/18 0303 11/21/18 0327  WBC 13.0* 16.7* 12.3* 11.0*  NEUTROABS 10.4* 14.5*  --   --   HGB 13.5 12.1 10.0* 9.8*  HCT 38.9 35.1* 29.2* 29.1*  MCV 101.3* 100.6* 101.4* 100.7*  PLT 295 238 180 155   Basic Metabolic Panel: Recent Labs  Lab 11/17/18 1243 11/17/18 1750 11/18/18 0531 11/20/18 0303 11/21/18 0327  NA 131* 131* 130* 127* 130*  K 4.1 4.9 4.0 4.0 3.7  CL 99 94* 95* 94* 96*  CO2 24 27 23 22  21*  GLUCOSE 136* 125* 133* 156* 146*  BUN 20 19 18 22  26*  CREATININE 0.80 0.81 0.94 0.85 0.92  CALCIUM 8.7* 9.4 8.8* 8.5* 8.2*   GFR: Estimated Creatinine Clearance: 43.3 mL/min (by C-G formula based on SCr of 0.92 mg/dL). Liver Function Tests: Recent Labs  Lab 11/17/18 1750 11/18/18 0531  AST 32 37  ALT 25 23  ALKPHOS 50 44  BILITOT 0.8 0.3  PROT 7.1 6.2*  ALBUMIN 3.3* 2.7*   No results for input(s): LIPASE, AMYLASE in the last 168 hours. No results for input(s): AMMONIA in the last 168 hours. Coagulation Profile: No results for input(s): INR, PROTIME in the last 168 hours. Cardiac Enzymes: Recent Labs  Lab 11/18/18 0531  CKTOTAL 428*   BNP (last 3 results) No results for input(s): PROBNP in the last 8760 hours. HbA1C: Recent Labs    11/19/18 1630  HGBA1C 5.9*   CBG: No results for input(s): GLUCAP in the last 168 hours. Lipid Profile: Recent Labs    11/19/18 1609  CHOL 178  HDL 66  LDLCALC 89  TRIG 114  CHOLHDL 2.7   Thyroid Function Tests: No results for input(s): TSH, T4TOTAL, FREET4, T3FREE,  THYROIDAB in the last 72 hours. Anemia Panel: No results for input(s): VITAMINB12, FOLATE, FERRITIN, TIBC, IRON, RETICCTPCT in the last 72 hours. Sepsis Labs: Recent Labs  Lab 11/17/18 1750 11/17/18 2057 11/18/18 0531 11/18/18 0751  PROCALCITON <0.10  --   --   --   LATICACIDVEN 2.1* 3.3* 1.9 2.4*    Recent Results (from the past 240 hour(s))  Culture, blood (routine x 2)     Status: Abnormal   Collection Time: 11/12/18  2:10 AM   Specimen: BLOOD  Result Value Ref Range Status   Specimen Description   Final    BLOOD RIGHT ANTECUBITAL Performed at [facility], [address], [place], [place] [address]    Special Requests   Final    BOTTLES DRAWN AEROBIC AND ANAEROBIC Blood Culture adequate volume Performed at [facility], [address] [place], [place] [address]    Culture  Setup Time   Final    GRAM POSITIVE COCCI IN BOTH AEROBIC AND ANAEROBIC BOTTLES CRITICAL  RESULT CALLED TO, READ BACK BY AND VERIFIED WITH: PHARMD T BAUMEISTER [id number] MLM    Culture (A)  Final    STAPHYLOCOCCUS SPECIES (COAGULASE NEGATIVE) THE SIGNIFICANCE OF ISOLATING THIS ORGANISM FROM A SINGLE SET OF BLOOD CULTURES WHEN MULTIPLE SETS ARE DRAWN IS UNCERTAIN. PLEASE NOTIFY THE MICROBIOLOGY DEPARTMENT WITHIN ONE WEEK IF SPECIATION AND SENSITIVITIES ARE REQUIRED. Performed at [facility], [address] [place], [place] [address]    Report Status 11/15/2018 FINAL  Final  Blood Culture ID Panel (Reflexed)     Status: Abnormal   Collection Time: 11/12/18  2:10 AM  Result Value Ref Range Status   Enterococcus species NOT DETECTED NOT DETECTED Final   Listeria monocytogenes NOT DETECTED NOT DETECTED Final   Staphylococcus species DETECTED (A) NOT DETECTED Final    Comment: Methicillin (oxacillin) susceptible coagulase negative staphylococcus. Possible blood culture contaminant (unless isolated from more than one blood culture draw or clinical case suggests pathogenicity).  No antibiotic treatment is indicated for blood  culture contaminants. CRITICAL RESULT CALLED TO, READ BACK BY AND VERIFIED WITH: PHARMD T BAUMEISTER [id number] MLM    Staphylococcus aureus (BCID) NOT DETECTED NOT DETECTED Final   Methicillin resistance NOT DETECTED NOT DETECTED Final   Streptococcus species NOT DETECTED NOT DETECTED Final   Streptococcus agalactiae NOT DETECTED NOT DETECTED Final   Streptococcus pneumoniae NOT DETECTED NOT DETECTED Final   Streptococcus pyogenes NOT DETECTED NOT DETECTED Final   Acinetobacter baumannii NOT DETECTED NOT DETECTED Final   Enterobacteriaceae species NOT DETECTED NOT DETECTED Final   Enterobacter cloacae complex NOT DETECTED NOT DETECTED Final   Escherichia coli NOT DETECTED NOT DETECTED Final   Klebsiella oxytoca NOT DETECTED NOT DETECTED Final   Klebsiella pneumoniae NOT DETECTED NOT DETECTED Final   Proteus species NOT DETECTED NOT DETECTED Final   Serratia marcescens NOT DETECTED NOT DETECTED Final   Haemophilus influenzae NOT DETECTED NOT DETECTED Final   Neisseria meningitidis NOT DETECTED NOT DETECTED Final   Pseudomonas aeruginosa NOT DETECTED NOT DETECTED Final   Candida albicans NOT DETECTED NOT DETECTED Final   Candida glabrata NOT DETECTED NOT DETECTED Final   Candida krusei NOT DETECTED NOT DETECTED Final   Candida parapsilosis NOT DETECTED NOT DETECTED Final   Candida tropicalis NOT DETECTED NOT DETECTED Final    Comment: Performed at [facility] [place] [facility], [address] [place], [place] [address]  SARS CORONAVIRUS 2 (TAT 6-24 HRS) Nasopharyngeal Nasopharyngeal Swab     Status: None   Collection Time: 11/12/18  5:47 AM   Specimen: Nasopharyngeal Swab  Result Value Ref Range Status   SARS Coronavirus 2 NEGATIVE NEGATIVE Final    Comment: (NOTE) SARS-CoV-2 target nucleic acids are NOT DETECTED. The SARS-CoV-2 RNA is generally detectable in upper and lower respiratory specimens during the acute phase of infection.  Negative results do not preclude SARS-CoV-2 infection, do not rule out co-infections with other pathogens, and should not be used as the sole basis for treatment or other patient management decisions. Negative results must be combined with clinical observations, patient history, and epidemiological information. The expected result is Negative. Fact Sheet for Patients: HairSlick.no Fact Sheet for Healthcare Providers: quierodirigir.com This test is not yet approved or cleared by the [place] FDA and  has been authorized for detection and/or diagnosis of SARS-CoV-2 by FDA under an Emergency Use Authorization (EUA). This EUA will remain  in effect (meaning this test can be used) for the duration of the COVID-19 declaration under Section 56  4(b)(1) of the Act, 21 U.S.C. section 360bbb-3(b)(1), unless the authorization is terminated or revoked sooner. Performed at [facility], [address] [place], [place] [address]   Culture, blood (routine x 2)     Status: None   Collection Time: 11/12/18  7:02 PM   Specimen: BLOOD LEFT HAND  Result Value Ref Range Status   Specimen Description BLOOD LEFT HAND  Final   Special Requests   Final    BOTTLES DRAWN AEROBIC ONLY Blood Culture adequate volume   Culture   Final    NO GROWTH 5 DAYS Performed at [facility], [address], [place], [place] [address]    Report Status 11/17/2018 FINAL  Final  Novel Coronavirus, NAA (Hosp order, Send-out to Ref Lab; TAT 18-24 hrs     Status: None   Collection Time: 11/16/18  4:51 PM   Specimen: Nasopharyngeal Swab; Respiratory  Result Value Ref Range Status   SARS-CoV-2, NAA NOT DETECTED NOT DETECTED Final    Comment: (NOTE) This nucleic acid amplification test was developed and its performance characteristics determined by [employer]. Nucleic acid amplification tests include PCR and TMA. This test has not been FDA  cleared or approved. This test has been authorized by FDA under an Emergency Use Authorization (EUA). This test is only authorized for the duration of time the declaration that circumstances exist justifying the authorization of the emergency use of in vitro diagnostic tests for detection of SARS-CoV-2 virus and/or diagnosis of COVID-19 infection under section 564(b)(1) of the Act, 21 U.S.C. 098JXB-1(Y) (1), unless the authorization is terminated or revoked sooner. When diagnostic testing is negative, the possibility of a false negative result should be considered in the context of a patient's recent exposures and the presence of clinical signs and symptoms consistent with COVID-19. An individual without symptoms of COVID- 19 and who is not shedding SARS-CoV-2 vi rus would expect to have a negative (not detected) result in this assay. Performed At: [facility] [address] [place], [place] [id number] Jolene Schimke MD YQ:[id number]    Coronavirus Source NASOPHARYNGEAL  Final    Comment: Performed at [facility], [address], [place], [place] [address]  Culture, blood (routine x 2)     Status: Abnormal   Collection Time: 11/18/18  7:45 AM   Specimen: BLOOD LEFT ARM  Result Value Ref Range Status   Specimen Description BLOOD LEFT ARM  Final   Special Requests   Final    BOTTLES DRAWN AEROBIC ONLY Blood Culture results may not be optimal due to an inadequate volume of blood received in culture bottles   Culture  Setup Time   Final    AEROBIC BOTTLE ONLY GRAM POSITIVE COCCI CRITICAL VALUE NOTED.  VALUE IS CONSISTENT WITH PREVIOUSLY REPORTED AND CALLED VALUE.    Culture (A)  Final    STAPHYLOCOCCUS AUREUS SUSCEPTIBILITIES PERFORMED ON PREVIOUS CULTURE WITHIN THE LAST 5 DAYS. Performed at [facility], [address] [place], [place] [address]    Report Status 11/20/2018 FINAL  Final  Culture, blood (routine x 2)     Status: Abnormal   Collection Time:  11/18/18  7:51 AM   Specimen: BLOOD LEFT HAND  Result Value Ref Range Status   Specimen Description BLOOD LEFT HAND  Final   Special Requests   Final    BOTTLES DRAWN AEROBIC ONLY Blood Culture results may not be optimal due to an inadequate volume of blood received in culture  bottles   Culture  Setup Time   Final    AEROBIC BOTTLE ONLY GRAM POSITIVE COCCI CRITICAL VALUE NOTED.  VALUE IS CONSISTENT WITH PREVIOUSLY REPORTED AND CALLED VALUE. Performed at [facility], [address], [place], [place] [address]    Culture STAPHYLOCOCCUS AUREUS (A)  Final   Report Status 11/20/2018 FINAL  Final   Organism ID, Bacteria STAPHYLOCOCCUS AUREUS  Final      Susceptibility   Staphylococcus aureus - MIC*    CIPROFLOXACIN <=0.5 SENSITIVE Sensitive     ERYTHROMYCIN <=0.25 SENSITIVE Sensitive     GENTAMICIN <=0.5 SENSITIVE Sensitive     OXACILLIN <=0.25 SENSITIVE Sensitive     TETRACYCLINE <=1 SENSITIVE Sensitive     VANCOMYCIN <=0.5 SENSITIVE Sensitive     TRIMETH/SULFA <=10 SENSITIVE Sensitive     CLINDAMYCIN <=0.25 SENSITIVE Sensitive     RIFAMPIN <=0.5 SENSITIVE Sensitive     Inducible Clindamycin NEGATIVE Sensitive     * STAPHYLOCOCCUS AUREUS  MRSA PCR Screening     Status: None   Collection Time: 11/19/18 10:48 AM   Specimen: Nasal Mucosa; Nasopharyngeal  Result Value Ref Range Status   MRSA by PCR NEGATIVE NEGATIVE Final    Comment:        The GeneXpert MRSA Assay (FDA approved for NASAL specimens only), is one component of a comprehensive MRSA colonization surveillance program. It is not intended to diagnose MRSA infection nor to guide or monitor treatment for MRSA infections. Performed at [facility], [address], [place], [place] [address]   Culture, blood (routine x 2)     Status: Abnormal   Collection Time: 11/19/18 11:22 AM   Specimen: BLOOD  Result Value Ref Range Status   Specimen Description BLOOD RIGHT ANTECUBITAL  Final   Special Requests   Final     AEROBIC BOTTLE ONLY Blood Culture results may not be optimal due to an excessive volume of blood received in culture bottles   Culture  Setup Time   Final    AEROBIC BOTTLE ONLY GRAM POSITIVE COCCI CRITICAL VALUE NOTED.  VALUE IS CONSISTENT WITH PREVIOUSLY REPORTED AND CALLED VALUE.    Culture (A)  Final    STAPHYLOCOCCUS AUREUS SUSCEPTIBILITIES PERFORMED ON PREVIOUS CULTURE WITHIN THE LAST 5 DAYS. Performed at [facility], [address], [place], [place] [address]    Report Status 11/20/2018 FINAL  Final  Culture, blood (routine x 2)     Status: Abnormal   Collection Time: 11/19/18 11:31 AM   Specimen: BLOOD RIGHT HAND  Result Value Ref Range Status   Specimen Description BLOOD RIGHT HAND  Final   Special Requests AEROBIC BOTTLE ONLY Blood Culture adequate volume  Final   Culture  Setup Time   Final    AEROBIC BOTTLE ONLY GRAM POSITIVE COCCI CRITICAL VALUE NOTED.  VALUE IS CONSISTENT WITH PREVIOUSLY REPORTED AND CALLED VALUE.    Culture (A)  Final    STAPHYLOCOCCUS AUREUS SUSCEPTIBILITIES PERFORMED ON PREVIOUS CULTURE WITHIN THE LAST 5 DAYS. Performed at [facility], [address] [place], [place] [address]    Report Status 11/20/2018 FINAL  Final      Radiology Studies: Ct Angio Head W Or Wo Contrast  Result Date: 11/20/2018 CLINICAL DATA:  Stroke follow-up EXAM: CT ANGIOGRAPHY HEAD AND NECK TECHNIQUE: Multidetector CT imaging of the head and neck was performed using the standard protocol during bolus administration of intravenous contrast. Multiplanar CT image reconstructions and MIPs were obtained to  evaluate the vascular anatomy. Carotid stenosis measurements (when applicable) are obtained utilizing NASCET criteria, using the distal internal carotid diameter as the denominator. CONTRAST:  75mL OMNIPAQUE IOHEXOL 350 MG/ML SOLN COMPARISON:  None. FINDINGS: CTA NECK FINDINGS SKELETON: There is no bony spinal canal stenosis. No lytic or blastic lesion. OTHER  NECK: Normal pharynx, larynx and major salivary glands. No cervical lymphadenopathy. Unremarkable thyroid gland. UPPER CHEST: Biapical scarring AORTIC ARCH: There is mild calcific atherosclerosis of the aortic arch. There is no aneurysm, dissection or hemodynamically significant stenosis of the visualized portion of the aorta. Conventional 3 vessel aortic branching pattern. There is approximately 50% stenosis of the proximal left subclavian artery, which is otherwise normal. Right subclavian artery visualized portion is normal. RIGHT CAROTID SYSTEM: No dissection, occlusion or aneurysm. Mild atherosclerotic calcification at the carotid bifurcation without hemodynamically significant stenosis. LEFT CAROTID SYSTEM: Normal without aneurysm, dissection or stenosis. VERTEBRAL ARTERIES: Left dominant configuration. Both origins are clearly patent. There is no dissection, occlusion or flow-limiting stenosis to the skull base (V1-V3 segments). CTA HEAD FINDINGS POSTERIOR CIRCULATION: --Vertebral arteries: Normal V4 segments. --Posterior inferior cerebellar arteries (PICA): Patent origins from the vertebral arteries. --Anterior inferior cerebellar arteries (AICA): Patent origins from the basilar artery. --Basilar artery: Diminutive basilar artery --Superior cerebellar arteries: Normal. --Posterior cerebral arteries: Both are predominantly supplied by the posterior communicating arteries (p-comm). Moderate stenosis of the proximal left P2 segment (series 11, image 20). ANTERIOR CIRCULATION: --Intracranial internal carotid arteries: Normal. --Anterior cerebral arteries (ACA): Normal. Both A1 segments are present. Patent anterior communicating artery (a-comm). --Middle cerebral arteries (MCA): Normal. VENOUS SINUSES: As permitted by contrast timing, patent. ANATOMIC VARIANTS: None Review of the MIP images confirms the above findings. IMPRESSION: 1. No emergent large vessel occlusion. 2. Moderate stenosis of the proximal left  PCA P2 segment. 3. Unchanged appearance of left cerebellar infarct. 4. Aortic Atherosclerosis (ICD10-I70.0). Electronically Signed   By: Deatra Robinson M.D.   On: 11/20/2018 02:27   Ct Head Wo Contrast  Result Date: 11/19/2018 CLINICAL DATA:  85 year old with acute encephalopathy. She has Staph aureus sepsis/bacteremia. EXAM: CT HEAD WITHOUT CONTRAST TECHNIQUE: Contiguous axial images were obtained from the base of the skull through the vertex without intravenous contrast. COMPARISON:  None. FINDINGS: Brain: Ventricular system normal in size and appearance for age. Mild age-appropriate cortical atrophy. Geographic low attenuation involving the LEFT superior cerebellar hemisphere extending into the LEFT cerebellar peduncle. No associated hemorrhage. Mild changes of small vessel disease of the white matter diffusely. No acute hemorrhage or hematoma. No extra-axial fluid collections. Vascular: Moderate BILATERAL carotid siphon and LEFT vertebral artery atherosclerosis. No hyperdense vessel. Skull: No skull fracture or other focal osseous abnormality involving the skull. Sinuses/Orbits: Visualized paranasal sinuses, bilateral mastoid air cells and bilateral middle ear cavities well-aerated. Visualized orbits and globes normal in appearance. Other: None. IMPRESSION: 1. Acute/subacute nonhemorrhagic stroke involving the LEFT superior cerebellar hemisphere extending into the LEFT cerebellar peduncle. 2. Mild age-appropriate cortical atrophy and mild chronic microvascular ischemic changes of the white matter. These results will be called to the ordering clinician or representative by the Radiologist Assistant, and communication documented in the PACS or zVision Dashboard. Electronically Signed   By: Hulan Saas M.D.   On: 11/19/2018 14:50   Ct Angio Neck W Or Wo Contrast  Result Date: 11/20/2018 CLINICAL DATA:  Stroke follow-up EXAM: CT ANGIOGRAPHY HEAD AND NECK TECHNIQUE: Multidetector CT imaging of the head  and neck was performed using the standard protocol during bolus administration of intravenous contrast. Multiplanar  CT image reconstructions and MIPs were obtained to evaluate the vascular anatomy. Carotid stenosis measurements (when applicable) are obtained utilizing NASCET criteria, using the distal internal carotid diameter as the denominator. CONTRAST:  75mL OMNIPAQUE IOHEXOL 350 MG/ML SOLN COMPARISON:  None. FINDINGS: CTA NECK FINDINGS SKELETON: There is no bony spinal canal stenosis. No lytic or blastic lesion. OTHER NECK: Normal pharynx, larynx and major salivary glands. No cervical lymphadenopathy. Unremarkable thyroid gland. UPPER CHEST: Biapical scarring AORTIC ARCH: There is mild calcific atherosclerosis of the aortic arch. There is no aneurysm, dissection or hemodynamically significant stenosis of the visualized portion of the aorta. Conventional 3 vessel aortic branching pattern. There is approximately 50% stenosis of the proximal left subclavian artery, which is otherwise normal. Right subclavian artery visualized portion is normal. RIGHT CAROTID SYSTEM: No dissection, occlusion or aneurysm. Mild atherosclerotic calcification at the carotid bifurcation without hemodynamically significant stenosis. LEFT CAROTID SYSTEM: Normal without aneurysm, dissection or stenosis. VERTEBRAL ARTERIES: Left dominant configuration. Both origins are clearly patent. There is no dissection, occlusion or flow-limiting stenosis to the skull base (V1-V3 segments). CTA HEAD FINDINGS POSTERIOR CIRCULATION: --Vertebral arteries: Normal V4 segments. --Posterior inferior cerebellar arteries (PICA): Patent origins from the vertebral arteries. --Anterior inferior cerebellar arteries (AICA): Patent origins from the basilar artery. --Basilar artery: Diminutive basilar artery --Superior cerebellar arteries: Normal. --Posterior cerebral arteries: Both are predominantly supplied by the posterior communicating arteries (p-comm). Moderate  stenosis of the proximal left P2 segment (series 11, image 20). ANTERIOR CIRCULATION: --Intracranial internal carotid arteries: Normal. --Anterior cerebral arteries (ACA): Normal. Both A1 segments are present. Patent anterior communicating artery (a-comm). --Middle cerebral arteries (MCA): Normal. VENOUS SINUSES: As permitted by contrast timing, patent. ANATOMIC VARIANTS: None Review of the MIP images confirms the above findings. IMPRESSION: 1. No emergent large vessel occlusion. 2. Moderate stenosis of the proximal left PCA P2 segment. 3. Unchanged appearance of left cerebellar infarct. 4. Aortic Atherosclerosis (ICD10-I70.0). Electronically Signed   By: Deatra Robinson M.D.   On: 11/20/2018 02:27   Mr Brain Wo Contrast  Result Date: 11/20/2018 CLINICAL DATA:  Stroke follow-up EXAM: MRI HEAD WITHOUT CONTRAST TECHNIQUE: Multiplanar, multiecho pulse sequences of the brain and surrounding structures were obtained without intravenous contrast. COMPARISON:  None. FINDINGS: BRAIN: There is a large acute infarct within the superior left cerebellar hemisphere. This extends into the left superior cerebellar peduncle. There is no mass effect. Mild edema. There is no acute hemorrhage. Multifocal white matter hyperintensity, most commonly due to chronic ischemic microangiopathy. There is generalized atrophy without lobar predilection. The midline structures are normal. VASCULAR: The major intracranial arterial and venous sinus flow voids are normal. Medial left cerebellar focus of chronic microhemorrhage. SKULL AND UPPER CERVICAL SPINE: Calvarial bone marrow signal is normal. There is no skull base mass. The visualized upper cervical spine and soft tissues are normal. SINUSES/ORBITS: There are no fluid levels or advanced mucosal thickening. The mastoid air cells and middle ear cavities are free of fluid. The orbits are normal. IMPRESSION: 1. Large acute left cerebellar infarct without hemorrhage or mass effect. 2. Moderate  chronic small vessel ischemic microangiopathy. Electronically Signed   By: Deatra Robinson M.D.   On: 11/20/2018 03:12   Vas [place] Upper Extremity Venous Duplex  Result Date: 11/20/2018 UPPER VENOUS STUDY  Indications: shoulder pain Comparison Study: no prior Performing Technologist: Jeb Levering RDMS, RVT  Examination Guidelines: A complete evaluation includes B-mode imaging, spectral Doppler, color Doppler, and power Doppler as needed of all accessible portions of each vessel. Bilateral testing is  considered an integral part of a complete examination. Limited examinations for reoccurring indications may be performed as noted.  Left Findings: +----------+------------+---------+-----------+----------+-------+  LEFT       Compressible Phasicity Spontaneous Properties Summary  +----------+------------+---------+-----------+----------+-------+  IJV            Full        Yes        Yes                         +----------+------------+---------+-----------+----------+-------+  Subclavian     Full        Yes        Yes                         +----------+------------+---------+-----------+----------+-------+  Axillary       Full        Yes        Yes                         +----------+------------+---------+-----------+----------+-------+  Brachial       Full        Yes        Yes                         +----------+------------+---------+-----------+----------+-------+  Radial         Full                                               +----------+------------+---------+-----------+----------+-------+  Ulnar          Full                                               +----------+------------+---------+-----------+----------+-------+  Cephalic       None                                       Acute   +----------+------------+---------+-----------+----------+-------+  Basilic        Full                                               +----------+------------+---------+-----------+----------+-------+  Summary:  Left: No  evidence of deep vein thrombosis in the upper extremity. Findings consistent with acute superficial vein thrombosis involving the left cephalic vein.  *See table(s) above for measurements and observations.  Diagnosing physician: Coral ElseVance Brabham MD Electronically signed by Coral ElseVance Brabham MD on 11/20/2018 at 6:59:46 AM.    Final       Scheduled Meds:  aspirin EC  325 mg Oral Daily   atorvastatin  20 mg Oral q1800   carvedilol  12.5 mg Oral BID   feeding supplement (ENSURE ENLIVE)  237 mL Oral BID BM   influenza vaccine adjuvanted  0.5 mL Intramuscular Tomorrow-1000   levothyroxine  50 mcg Oral QAC breakfast   lidocaine  1 patch Transdermal Q24H   losartan  100 mg Oral Daily  pantoprazole  40 mg Oral BID   senna-docusate  2 tablet Oral BID   sodium chloride flush  3 mL Intravenous Q12H   vitamin B-12  1,000 mcg Oral Daily   Continuous Infusions:  methocarbamol (ROBAXIN) IV 500 mg (11/17/18 2004)   nafcillin IV 2 g (11/21/18 0912)     LOS: 8 days      Time spent: 25 minutes   Noralee Stain, DO [facility] 11/21/2018, 10:40 AM   Available via Epic secure chat 7am-7pm After these hours, please refer to coverage provider listed on [email or web address]

## 2018-11-21 NOTE — Progress Notes (Addendum)
SLP Cancellation Note  Patient Details Name: Autumn [name] MRN: [id number] DOB: [birth year]   Cancelled treatment:       Reason Eval/Treat Not Completed: SLP screened, no needs identified, will sign off   SLP recieved pt in bed, non-conversant d/t pain. Pt's son present and doesn't report any overt s/s of aspiration when consuming food or liquids. SLP also spoke with pt's nurse. He doesn't report any overt s/s of aspiration or dysphagia as well. SLP made several attempts to engage pt in conversation or in trials of PO but she refused d/t pain - pt unable to rate pain.  Nursing aware of pain. ST to sign off.    Crystalyn Delia 11/21/2018, 12:17 PM

## 2018-11-21 NOTE — Plan of Care (Signed)

## 2018-11-21 NOTE — Progress Notes (Signed)
Physical Therapy Treatment Patient Details Name: Autumn [name] MRN: [id number] DOB: 1932-07-17 Today's Date: 11/21/2018    History of Present Illness 85 y.o. female with past medical history significant for HTN, LBBB, and GERD who presents to the ED via EMS from [facility] center for acute onset neck and back pain as well as vomiting. In ED became orthostatic with 3 minutes of standing and found to have elevated troponing levels. Admitted for observation 9/29.    PT Comments    Attempted PT session and patient limited severely due to pain in low back, R hip and RLE.  She is tearful when attempting to sit and weight shift to R.  Pt returned to bed and LE there ex attempted she has poor tolerance to this as well.  Based on poor progress she cannot return home and will require a longer slower rehab in a SNF before returning home.    Follow Up Recommendations  SNF;Supervision for mobility/OOB     Equipment Recommendations  Rolling walker with 5" wheels    Recommendations for Other Services       Precautions / Restrictions Precautions Precautions: Fall Precaution Comments: severe back pain Restrictions Weight Bearing Restrictions: No    Mobility  Bed Mobility Overal bed mobility: Needs Assistance Bed Mobility: Rolling Rolling: Total assist Sidelying to sit: Max assist       General bed mobility comments: pt with no attempt to assist with bed mobility today, totalA.  And in sitting would not weight shift to the R side.  Leaning L into therapist  Transfers                 General transfer comment: deferred due to pain and poor tolerance of bed mobility  Ambulation/Gait                 Stairs             Wheelchair Mobility    Modified Rankin (Stroke Patients Only)       Balance Overall balance assessment: Needs assistance   Sitting balance-Leahy Scale: Fair       Standing balance-Leahy Scale: Poor Standing balance comment:  dependent on physical assist                            Cognition Arousal/Alertness: Awake/alert Behavior During Therapy: Flat affect Overall Cognitive Status: Impaired/Different from baseline Area of Impairment: Problem solving;Following commands;Safety/judgement;Orientation                 Orientation Level: Disoriented to;Situation;Time     Following Commands: Follows one step commands with increased time;Follows one step commands inconsistently Safety/Judgement: Decreased awareness of safety   Problem Solving: Slow processing;Requires verbal cues;Requires tactile cues;Decreased initiation General Comments: significant delay with processing/following commands. pt tending to maintain eyes closed requiring cues to arouse; soft spoken and requires encouragement to engaged with therapist       Exercises General Exercises - Lower Extremity Ankle Circles/Pumps: AROM;Both;10 reps;Supine Heel Slides: 5 reps;Both;AAROM;Supine    General Comments        Pertinent Vitals/Pain Pain Assessment: 0-10 Pain Score: 10-Worst pain ever Pain Location: R side of sacrum radiating down R leg.  pain worse with hip flexion Pain Descriptors / Indicators: Pressure;Constant;Crying;Grimacing Pain Intervention(s): Monitored during session;Premedicated before session;Repositioned    Home Living                      Prior  Function            PT Goals (current goals can now be found in the care plan section) Acute Rehab PT Goals Patient Stated Goal: to lay back down and not move Potential to Achieve Goals: Good Progress towards PT goals: Not progressing toward goals - comment(not progressing due to severe pain that meds are not able to manage)    Frequency    Min 3X/week      PT Plan Current plan remains appropriate    Co-evaluation              AM-PAC PT "6 Clicks" Mobility   Outcome Measure  Help needed turning from your back to your side while in a  flat bed without using bedrails?: Total Help needed moving from lying on your back to sitting on the side of a flat bed without using bedrails?: Total Help needed moving to and from a bed to a chair (including a wheelchair)?: Total Help needed standing up from a chair using your arms (e.g., wheelchair or bedside chair)?: Total Help needed to walk in hospital room?: Total Help needed climbing 3-5 steps with a railing? : Total 6 Click Score: 6    End of Session Equipment Utilized During Treatment: Gait belt Activity Tolerance: Patient limited by pain Patient left: in bed;with call bell/phone within reach;with bed alarm set Nurse Communication: Mobility status PT Visit Diagnosis: Muscle weakness (generalized) (M62.81);Difficulty in walking, not elsewhere classified (R26.2);Other abnormalities of gait and mobility (R26.89)     Time: 0315-9458 PT Time Calculation (min) (ACUTE ONLY): 25 min  Charges:  $Therapeutic Activity: 23-37 mins                     Joycelyn Rua, PTA Acute Rehabilitation Services Pager [phone] Office [phone]     Daesean Lazarz Artis Delay 11/21/2018, 4:18 PM

## 2018-11-22 ENCOUNTER — Inpatient Hospital Stay (HOSPITAL_COMMUNITY): Payer: Medicare Other

## 2018-11-22 DIAGNOSIS — I6340 Cerebral infarction due to embolism of unspecified cerebral artery: Secondary | ICD-10-CM

## 2018-11-22 DIAGNOSIS — I059 Rheumatic mitral valve disease, unspecified: Secondary | ICD-10-CM

## 2018-11-22 DIAGNOSIS — N179 Acute kidney failure, unspecified: Secondary | ICD-10-CM

## 2018-11-22 LAB — CREATININE, URINE, RANDOM: Creatinine, Urine: 58.24 mg/dL

## 2018-11-22 LAB — SODIUM, URINE, RANDOM: Sodium, Ur: 14 mmol/L

## 2018-11-22 LAB — CBC
HCT: 30.1 % — ABNORMAL LOW (ref 36.0–46.0)
Hemoglobin: 10.2 g/dL — ABNORMAL LOW (ref 12.0–15.0)
MCH: 33.9 pg (ref 26.0–34.0)
MCHC: 33.9 g/dL (ref 30.0–36.0)
MCV: 100 fL (ref 80.0–100.0)
Platelets: 152 10*3/uL (ref 150–400)
RBC: 3.01 MIL/uL — ABNORMAL LOW (ref 3.87–5.11)
RDW: 13.8 % (ref 11.5–15.5)
WBC: 12.4 10*3/uL — ABNORMAL HIGH (ref 4.0–10.5)
nRBC: 0 % (ref 0.0–0.2)

## 2018-11-22 LAB — BASIC METABOLIC PANEL
Anion gap: 13 (ref 5–15)
BUN: 40 mg/dL — ABNORMAL HIGH (ref 8–23)
CO2: 20 mmol/L — ABNORMAL LOW (ref 22–32)
Calcium: 7.7 mg/dL — ABNORMAL LOW (ref 8.9–10.3)
Chloride: 99 mmol/L (ref 98–111)
Creatinine, Ser: 1.51 mg/dL — ABNORMAL HIGH (ref 0.44–1.00)
GFR calc Af Amer: 36 mL/min — ABNORMAL LOW (ref >60)
GFR calc non Af Amer: 31 mL/min — ABNORMAL LOW (ref >60)
Glucose, Bld: 162 mg/dL — ABNORMAL HIGH (ref 70–99)
Potassium: 3.3 mmol/L — ABNORMAL LOW (ref 3.5–5.1)
Sodium: 132 mmol/L — ABNORMAL LOW (ref 135–145)

## 2018-11-22 IMAGING — US US RENAL
1 series · 14 of 25 positions shown · non-contrast
Comparison: CT abdomen pelvis dated [DATE] and renal ultrasound
dated [DATE].

CLINICAL DATA: Acute kidney injury

EXAM:
RENAL / URINARY TRACT ULTRASOUND COMPLETE

[Series 1: us renal · 14 of 51 slices shown]
[im 1/51]
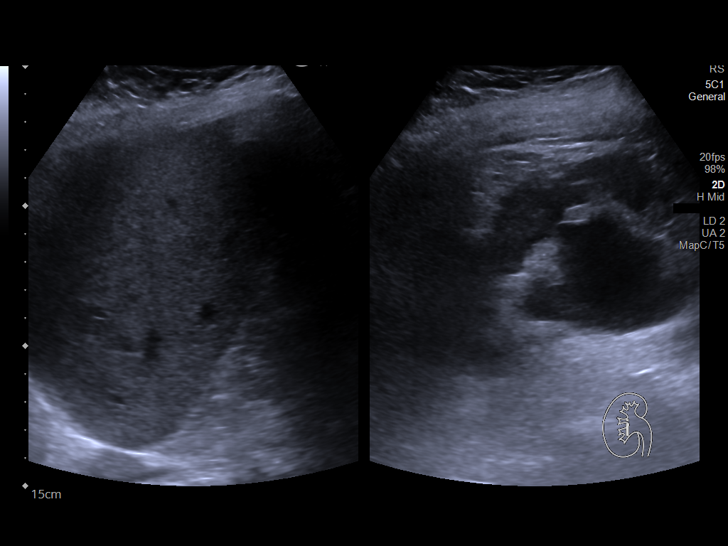
[im 5/51]
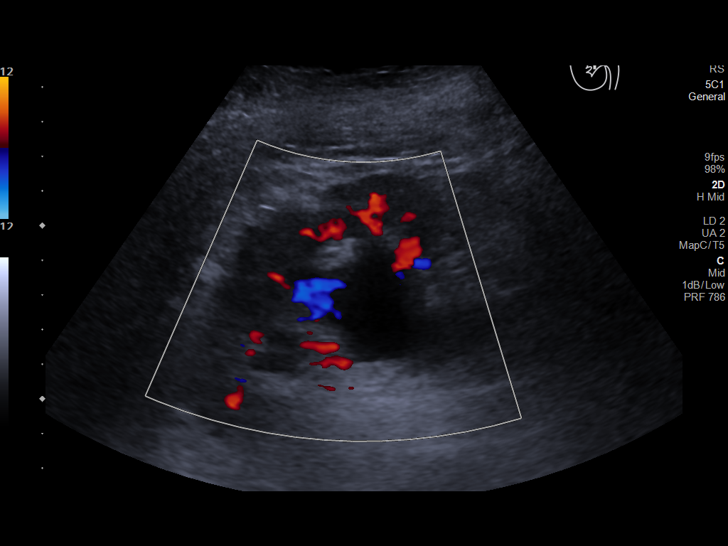
[im 9/51]
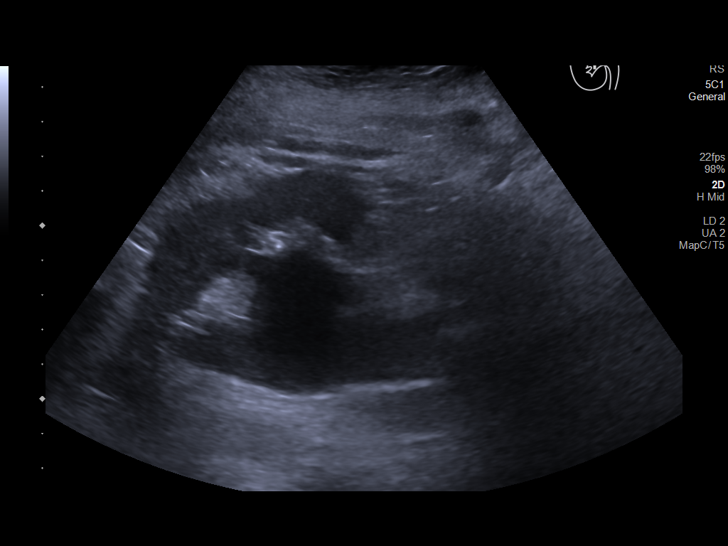
[im 13/51]
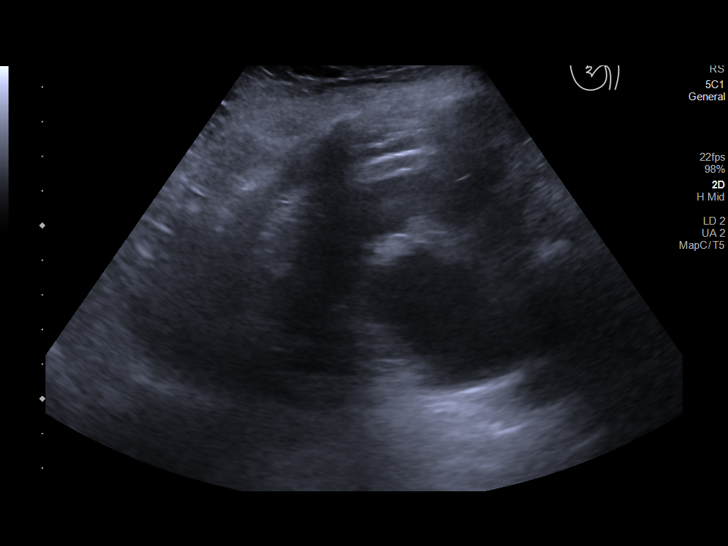
[im 17/51]
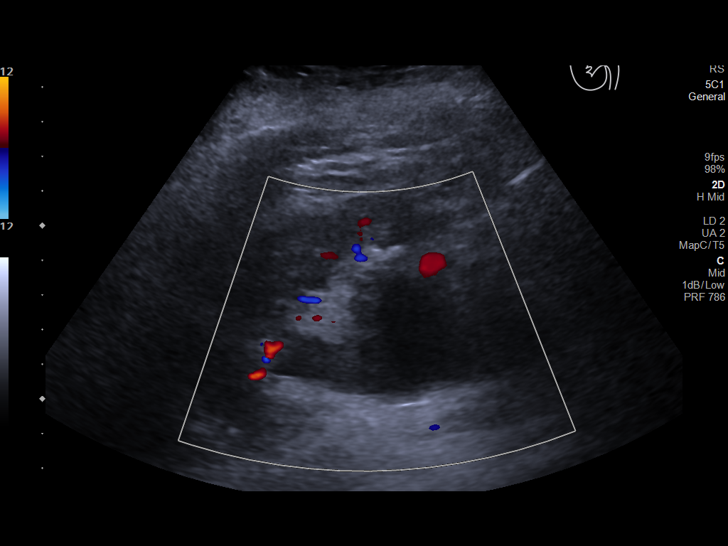
[im 19/51]
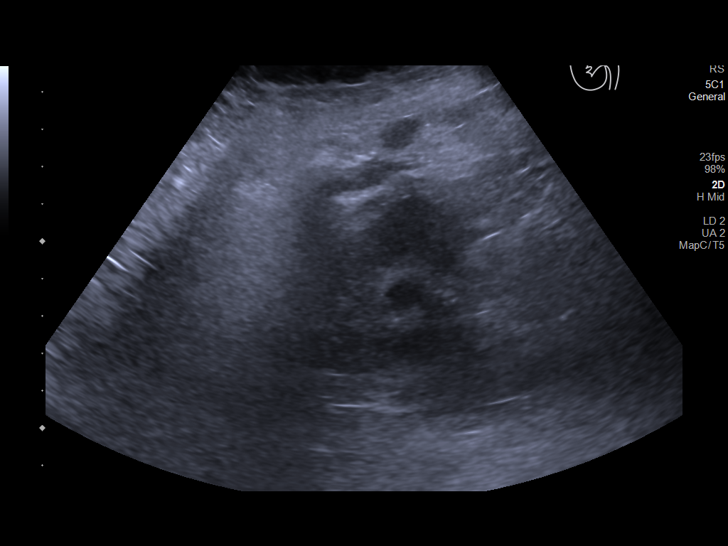
[im 23/51]
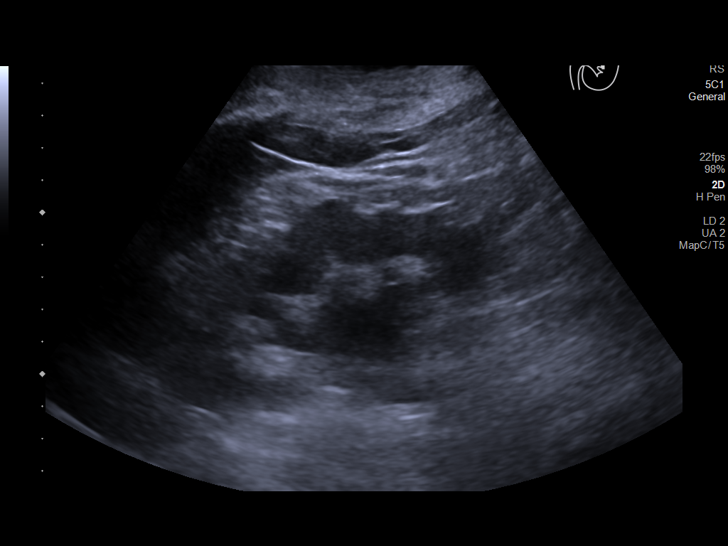
[im 28/51]
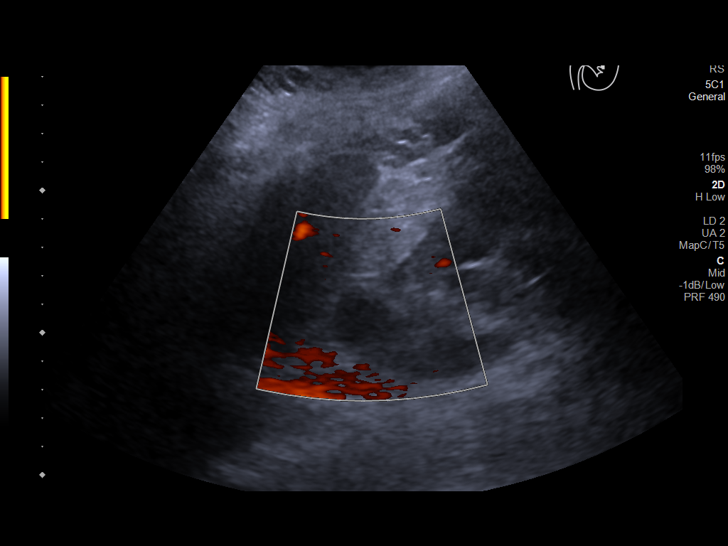
[im 32/51]
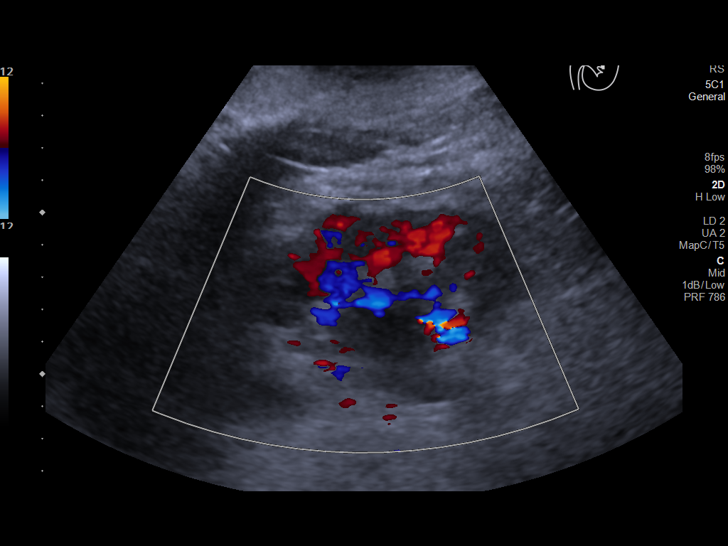
[im 34/51]
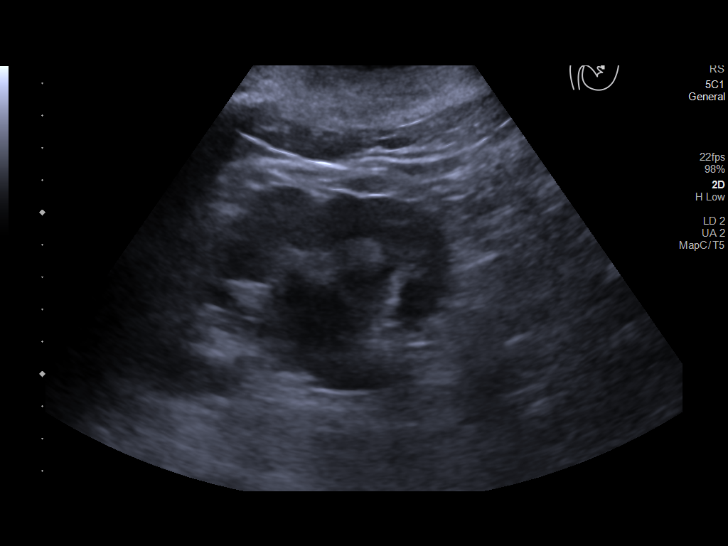
[im 38/51]
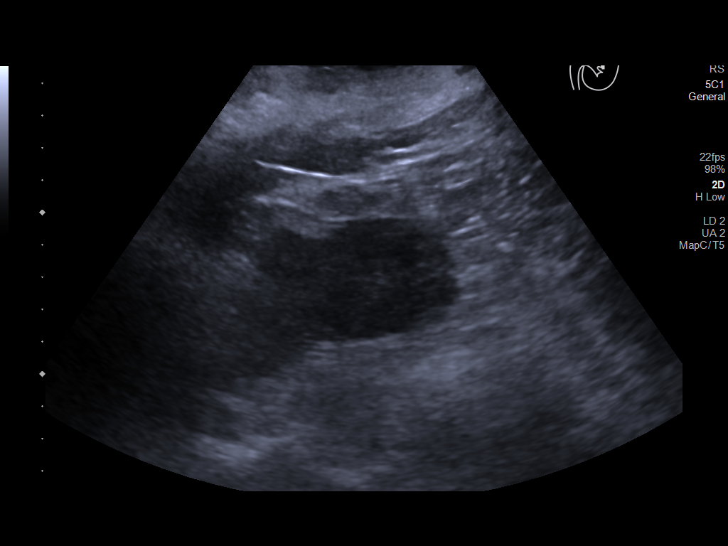
[im 42/51]
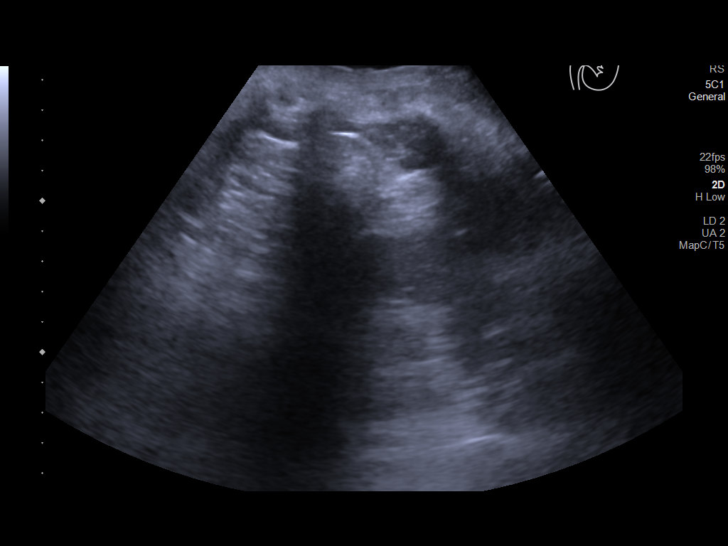
[im 46/51]
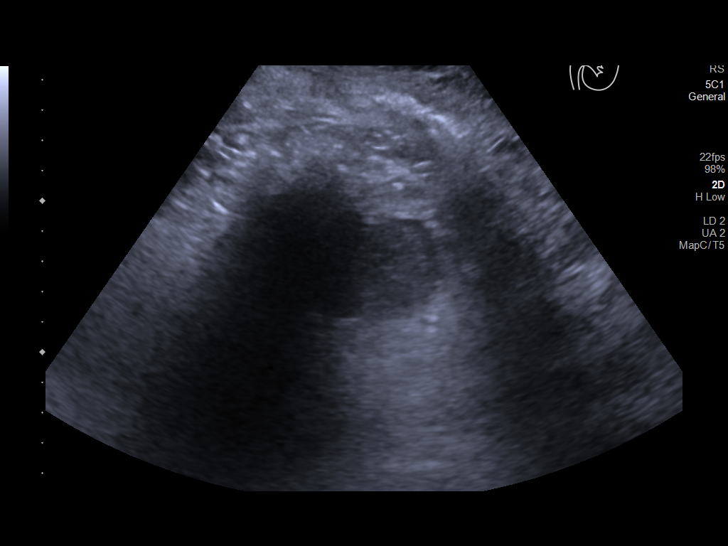
[im 51/51]
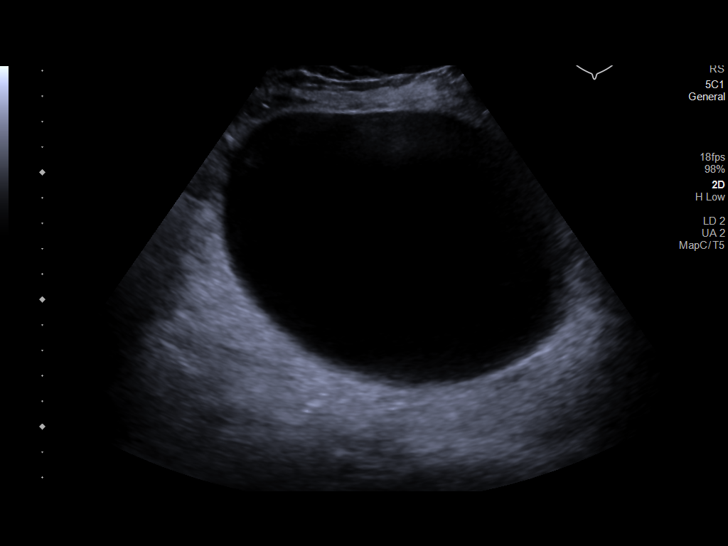

[14 of 25 positions shown; findings below may reference images not displayed]

FINDINGS: Right Kidney:

Renal measurements: 10.2 x 6.0 x 5.6 cm = volume: 179 mL .
Echogenicity within normal limits. No mass is visualized. There is
moderate hydronephrosis.

Left Kidney:

Renal measurements: 11.3 x 5.8 x 5.2 cm = volume: 176 mL.
Echogenicity within normal limits. A 2.1 cm cyst is seen. No solid
mass is visualized. There is moderate hydronephrosis.

Bladder:

Appears normal for degree of bladder distention. Patient was unable
to void there but urinary bladder.
IMPRESSION: Moderate bilateral hydronephrosis.

## 2018-11-22 MED ORDER — POTASSIUM CHLORIDE CRYS ER 20 MEQ PO TBCR
40.0000 meq | EXTENDED_RELEASE_TABLET | Freq: Once | ORAL | Status: AC
  Administered 2018-11-22: 40 meq via ORAL
  Filled 2018-11-22: qty 2

## 2018-11-22 MED ORDER — TRAZODONE HCL 50 MG PO TABS
50.0000 mg | ORAL_TABLET | Freq: Once | ORAL | Status: AC
  Administered 2018-11-23: 50 mg via ORAL
  Filled 2018-11-22: qty 1

## 2018-11-22 MED ORDER — SODIUM CHLORIDE 0.9 % IV SOLN
INTRAVENOUS | Status: DC
  Administered 2018-11-22 – 2018-11-26 (×8): via INTRAVENOUS

## 2018-11-22 MED ORDER — SODIUM CHLORIDE 0.9 % IV SOLN
2.0000 g | Freq: Two times a day (BID) | INTRAVENOUS | Status: DC
  Administered 2018-11-22 – 2018-11-28 (×13): 2 g via INTRAVENOUS
  Filled 2018-11-22: qty 2
  Filled 2018-11-22 (×3): qty 20
  Filled 2018-11-22 (×6): qty 2
  Filled 2018-11-22 (×2): qty 20
  Filled 2018-11-22: qty 2
  Filled 2018-11-22: qty 20

## 2018-11-22 NOTE — Progress Notes (Signed)
Nutrition Follow-up  DOCUMENTATION CODES:   Not applicable  INTERVENTION:  Continue Ensure Enlive po BID, each supplement provides 350 kcal and 20 grams of protein  Encourage adequate PO intake.   NUTRITION DIAGNOSIS:   Inadequate oral intake related to acute illness as evidenced by meal completion < 50%; ongoing  GOAL:   Patient will meet greater than or equal to 90% of their needs; progressing  MONITOR:   PO intake, Supplement acceptance, Labs, Weight trends, Skin, I & O's  REASON FOR ASSESSMENT:   Consult Assessment of nutrition requirement/status  ASSESSMENT:   85 y.o. female with past medical history significant for HTN, LBBB, and GERD who presents to the ED via EMS from [facility] center for acute onset neck and back pain as well as vomiting. In ED became orthostatic with 3 minutes of standing and found to have elevated troponing levels. Pt found to have bilateral hydronephrosis  Meal completion has been 75%. Pt currently has Ensure ordered and has been consuming them. RD to continue with current orders to aid in in caloric and protein needs.    Labs and medications reviewed.   Diet Order:   Diet Order            Diet Heart Room service appropriate? Yes; Fluid consistency: Thin  Diet effective now              EDUCATION NEEDS:   No education needs have been identified at this time  Skin:  Skin Assessment: Reviewed RN Assessment  Last BM:  10/4  Height:   Ht Readings from Last 1 Encounters:  11/20/18 5\' 3"  (1.6 m)    Weight:   Wt Readings from Last 1 Encounters:  11/22/18 73.8 kg    Ideal Body Weight:  56.3 kg  BMI:  Body mass index is 28.82 kg/m.  Estimated Nutritional Needs:   Kcal:  1650-1850  Protein:  80-90 grams  Fluid:  >/= 1.7 L/day    Corrin Parker, MS, RD, LDN Pager # [phone] After hours/ weekend pager # [phone]

## 2018-11-22 NOTE — Consult Note (Signed)
Subjective: No new complaints. Continuing to have back pain.   Antibiotics:  Anti-infectives (From admission, onward)   Start     Dose/Rate Route Frequency Ordered Stop   11/20/18 1115  vancomycin (VANCOCIN) IVPB 750 mg/150 ml premix  Status:  Discontinued     750 mg 150 mL/hr over 60 Minutes Intravenous Every 24 hours 11/19/18 1057 11/20/18 1010   11/20/18 1100  nafcillin 2 g in sodium chloride 0.9 % 100 mL IVPB     2 g 200 mL/hr over 30 Minutes Intravenous Every 4 hours 11/20/18 1010     11/19/18 1115  vancomycin (VANCOCIN) 1,750 mg in sodium chloride 0.9 % 500 mL IVPB     1,750 mg 250 mL/hr over 120 Minutes Intravenous  Once 11/19/18 1042 11/19/18 1419      Medications: Scheduled Meds:  aspirin EC  325 mg Oral Daily   atorvastatin  20 mg Oral q1800   carvedilol  12.5 mg Oral BID   feeding supplement (ENSURE ENLIVE)  237 mL Oral BID BM   influenza vaccine adjuvanted  0.5 mL Intramuscular Tomorrow-1000   levothyroxine  50 mcg Oral QAC breakfast   lidocaine  1 patch Transdermal Q24H   pantoprazole  40 mg Oral BID   senna-docusate  2 tablet Oral BID   sodium chloride flush  3 mL Intravenous Q12H   vitamin B-12  1,000 mcg Oral Daily   Continuous Infusions:  sodium chloride 100 mL/hr at 11/22/18 0952   methocarbamol (ROBAXIN) IV 500 mg (11/17/18 2004)   nafcillin IV 2 g (11/22/18 0951)   PRN Meds:.acetaminophen **OR** acetaminophen, albuterol, alum & mag hydroxide-simeth, diclofenac sodium, HYDROcodone-acetaminophen, labetalol, LORazepam, methocarbamol (ROBAXIN) IV, naLOXone (NARCAN)  injection, traMADol  Objective: Weight change: 8.3 kg  Intake/Output Summary (Last 24 hours) at 11/22/2018 1236 Last data filed at 11/22/2018 0600 Gross per 24 hour  Intake 758.72 ml  Output --  Net 758.72 ml   Blood pressure 115/60, pulse 62, temperature (!) 97.4 F (36.3 C), temperature source Oral, resp. rate 20, height 5\' 3"  (1.6 m), weight 73.8 kg, SpO2 95  %. Temp:  [97.4 F (36.3 C)-101.4 F (38.6 C)] 97.4 F (36.3 C) (10/09 1128) Pulse Rate:  [62-91] 62 (10/09 1128) Resp:  [16-20] 20 (10/09 1128) BP: (84-133)/(42-70) 115/60 (10/09 1128) SpO2:  [91 %-98 %] 95 % (10/09 1128) Weight:  [73.8 kg] 73.8 kg (10/09 0121)  Physical Exam: General: Awake, not in any acute distress, appears very weak HEENT: anicteric sclera, EOMI CVS regular rate, normal  Chest: , no wheezing, no respiratory distress Abdomen: soft non-distended,  Extremities: no edema or deformity noted bilaterally Skin: no rashes Neuro: nonfocal, able to answer questions  CBC: CBC Latest Ref Rng & Units 11/22/2018 11/21/2018 11/20/2018  WBC 4.0 - 10.5 K/uL 12.4(H) 11.0(H) 12.3(H)  Hemoglobin 12.0 - 15.0 g/dL 10.2(L) 9.8(L) 10.0(L)  Hematocrit 36.0 - 46.0 % 30.1(L) 29.1(L) 29.2(L)  Platelets 150 - 400 K/uL 152 155 180    BMET Recent Labs    11/21/18 0327 11/22/18 0345  NA 130* 132*  K 3.7 3.3*  CL 96* 99  CO2 21* 20*  GLUCOSE 146* 162*  BUN 26* 40*  CREATININE 0.92 1.51*  CALCIUM 8.2* 7.7*     Liver Panel  No results for input(s): PROT, ALBUMIN, AST, ALT, ALKPHOS, BILITOT, BILIDIR, IBILI in the last 72 hours.     Sedimentation Rate Recent Labs    11/20/18 0303  ESRSEDRATE 95*   C-Reactive  Protein Recent Labs    11/20/18 0303  CRP 37.4*    Micro Results: Recent Results (from the past 720 hour(s))  Culture, blood (routine x 2)     Status: Abnormal   Collection Time: 11/12/18  2:10 AM   Specimen: BLOOD  Result Value Ref Range Status   Specimen Description   Final    BLOOD RIGHT ANTECUBITAL Performed at [facility], [address] [place], [place] [address]    Special Requests   Final    BOTTLES DRAWN AEROBIC AND ANAEROBIC Blood Culture adequate volume Performed at [facility], [address], [place], [place] [address]    Culture  Setup Time   Final    GRAM POSITIVE COCCI IN BOTH AEROBIC AND ANAEROBIC  BOTTLES CRITICAL RESULT CALLED TO, READ BACK BY AND VERIFIED WITH: PHARMD T BAUMEISTER [id number] MLM    Culture (A)  Final    STAPHYLOCOCCUS SPECIES (COAGULASE NEGATIVE) THE SIGNIFICANCE OF ISOLATING THIS ORGANISM FROM A SINGLE SET OF BLOOD CULTURES WHEN MULTIPLE SETS ARE DRAWN IS UNCERTAIN. PLEASE NOTIFY THE MICROBIOLOGY DEPARTMENT WITHIN ONE WEEK IF SPECIATION AND SENSITIVITIES ARE REQUIRED. Performed at [facility], [address], [place], [place] [address]    Report Status 11/15/2018 FINAL  Final  Blood Culture ID Panel (Reflexed)     Status: Abnormal   Collection Time: 11/12/18  2:10 AM  Result Value Ref Range Status   Enterococcus species NOT DETECTED NOT DETECTED Final   Listeria monocytogenes NOT DETECTED NOT DETECTED Final   Staphylococcus species DETECTED (A) NOT DETECTED Final    Comment: Methicillin (oxacillin) susceptible coagulase negative staphylococcus. Possible blood culture contaminant (unless isolated from more than one blood culture draw or clinical case suggests pathogenicity). No antibiotic treatment is indicated for blood  culture contaminants. CRITICAL RESULT CALLED TO, READ BACK BY AND VERIFIED WITH: PHARMD T BAUMEISTER [id number] MLM    Staphylococcus aureus (BCID) NOT DETECTED NOT DETECTED Final   Methicillin resistance NOT DETECTED NOT DETECTED Final   Streptococcus species NOT DETECTED NOT DETECTED Final   Streptococcus agalactiae NOT DETECTED NOT DETECTED Final   Streptococcus pneumoniae NOT DETECTED NOT DETECTED Final   Streptococcus pyogenes NOT DETECTED NOT DETECTED Final   Acinetobacter baumannii NOT DETECTED NOT DETECTED Final   Enterobacteriaceae species NOT DETECTED NOT DETECTED Final   Enterobacter cloacae complex NOT DETECTED NOT DETECTED Final   Escherichia coli NOT DETECTED NOT DETECTED Final   Klebsiella oxytoca NOT DETECTED NOT DETECTED Final   Klebsiella pneumoniae NOT DETECTED NOT DETECTED Final   Proteus species NOT DETECTED NOT  DETECTED Final   Serratia marcescens NOT DETECTED NOT DETECTED Final   Haemophilus influenzae NOT DETECTED NOT DETECTED Final   Neisseria meningitidis NOT DETECTED NOT DETECTED Final   Pseudomonas aeruginosa NOT DETECTED NOT DETECTED Final   Candida albicans NOT DETECTED NOT DETECTED Final   Candida glabrata NOT DETECTED NOT DETECTED Final   Candida krusei NOT DETECTED NOT DETECTED Final   Candida parapsilosis NOT DETECTED NOT DETECTED Final   Candida tropicalis NOT DETECTED NOT DETECTED Final    Comment: Performed at [facility], [address], [place], [place] [address]  SARS CORONAVIRUS 2 (TAT 6-24 HRS) Nasopharyngeal Nasopharyngeal Swab     Status: None   Collection Time: 11/12/18  5:47 AM   Specimen: Nasopharyngeal Swab  Result Value Ref Range Status   SARS Coronavirus 2 NEGATIVE NEGATIVE Final    Comment: (NOTE) SARS-CoV-2 target nucleic acids are NOT DETECTED.  The SARS-CoV-2 RNA is generally detectable in upper and lower respiratory specimens during the acute phase of infection. Negative results do not preclude SARS-CoV-2 infection, do not rule out co-infections with other pathogens, and should not be used as the sole basis for treatment or other patient management decisions. Negative results must be combined with clinical observations, patient history, and epidemiological information. The expected result is Negative. Fact Sheet for Patients: HairSlick.no Fact Sheet for Healthcare Providers: quierodirigir.com This test is not yet approved or cleared by the [place] FDA and  has been authorized for detection and/or diagnosis of SARS-CoV-2 by FDA under an Emergency Use Authorization (EUA). This EUA will remain  in effect (meaning this test can be used) for the duration of the COVID-19 declaration under Section 56 4(b)(1) of the Act, 21 U.S.C. section 360bbb-3(b)(1), unless the authorization is terminated  or revoked sooner. Performed at [facility], [address] [place], [place] [address]   Culture, blood (routine x 2)     Status: None   Collection Time: 11/12/18  7:02 PM   Specimen: BLOOD LEFT HAND  Result Value Ref Range Status   Specimen Description BLOOD LEFT HAND  Final   Special Requests   Final    BOTTLES DRAWN AEROBIC ONLY Blood Culture adequate volume   Culture   Final    NO GROWTH 5 DAYS Performed at [facility], [address], [place], [place] [address]    Report Status 11/17/2018 FINAL  Final  Novel Coronavirus, NAA (Hosp order, Send-out to Ref Lab; TAT 18-24 hrs     Status: None   Collection Time: 11/16/18  4:51 PM   Specimen: Nasopharyngeal Swab; Respiratory  Result Value Ref Range Status   SARS-CoV-2, NAA NOT DETECTED NOT DETECTED Final    Comment: (NOTE) This nucleic acid amplification test was developed and its performance characteristics determined by [employer]. Nucleic acid amplification tests include PCR and TMA. This test has not been FDA cleared or approved. This test has been authorized by FDA under an Emergency Use Authorization (EUA). This test is only authorized for the duration of time the declaration that circumstances exist justifying the authorization of the emergency use of in vitro diagnostic tests for detection of SARS-CoV-2 virus and/or diagnosis of COVID-19 infection under section 564(b)(1) of the Act, 21 U.S.C. 941DEY-8(X) (1), unless the authorization is terminated or revoked sooner. When diagnostic testing is negative, the possibility of a false negative result should be considered in the context of a patient's recent exposures and the presence of clinical signs and symptoms consistent with COVID-19. An individual without symptoms of COVID- 19 and who is not shedding SARS-CoV-2 vi rus would expect to have a negative (not detected) result in this assay. Performed At: [facility] [address]  [place], [place] [id number] Jolene Schimke MD SH:[id number]    Coronavirus Source NASOPHARYNGEAL  Final    Comment: Performed at [facility], [address] [place], [place] [address]  Culture, blood (routine x 2)     Status: Abnormal   Collection Time: 11/18/18  7:45 AM   Specimen: BLOOD LEFT ARM  Result Value Ref Range Status   Specimen Description BLOOD LEFT ARM  Final   Special Requests   Final    BOTTLES DRAWN AEROBIC ONLY Blood Culture results may not be optimal due to an inadequate volume of blood received in culture bottles   Culture  Setup Time   Final    AEROBIC BOTTLE  ONLY GRAM POSITIVE COCCI CRITICAL VALUE NOTED.  VALUE IS CONSISTENT WITH PREVIOUSLY REPORTED AND CALLED VALUE.    Culture (A)  Final    STAPHYLOCOCCUS AUREUS SUSCEPTIBILITIES PERFORMED ON PREVIOUS CULTURE WITHIN THE LAST 5 DAYS. Performed at [facility], [address] [place], [place] [address]    Report Status 11/20/2018 FINAL  Final  Culture, blood (routine x 2)     Status: Abnormal   Collection Time: 11/18/18  7:51 AM   Specimen: BLOOD LEFT HAND  Result Value Ref Range Status   Specimen Description BLOOD LEFT HAND  Final   Special Requests   Final    BOTTLES DRAWN AEROBIC ONLY Blood Culture results may not be optimal due to an inadequate volume of blood received in culture bottles   Culture  Setup Time   Final    AEROBIC BOTTLE ONLY GRAM POSITIVE COCCI CRITICAL VALUE NOTED.  VALUE IS CONSISTENT WITH PREVIOUSLY REPORTED AND CALLED VALUE. Performed at [facility], [address] [place], [place] [address]    Culture STAPHYLOCOCCUS AUREUS (A)  Final   Report Status 11/20/2018 FINAL  Final   Organism ID, Bacteria STAPHYLOCOCCUS AUREUS  Final      Susceptibility   Staphylococcus aureus - MIC*    CIPROFLOXACIN <=0.5 SENSITIVE Sensitive     ERYTHROMYCIN <=0.25 SENSITIVE Sensitive     GENTAMICIN <=0.5 SENSITIVE Sensitive     OXACILLIN <=0.25 SENSITIVE Sensitive     TETRACYCLINE <=1  SENSITIVE Sensitive     VANCOMYCIN <=0.5 SENSITIVE Sensitive     TRIMETH/SULFA <=10 SENSITIVE Sensitive     CLINDAMYCIN <=0.25 SENSITIVE Sensitive     RIFAMPIN <=0.5 SENSITIVE Sensitive     Inducible Clindamycin NEGATIVE Sensitive     * STAPHYLOCOCCUS AUREUS  MRSA PCR Screening     Status: None   Collection Time: 11/19/18 10:48 AM   Specimen: Nasal Mucosa; Nasopharyngeal  Result Value Ref Range Status   MRSA by PCR NEGATIVE NEGATIVE Final    Comment:        The GeneXpert MRSA Assay (FDA approved for NASAL specimens only), is one component of a comprehensive MRSA colonization surveillance program. It is not intended to diagnose MRSA infection nor to guide or monitor treatment for MRSA infections. Performed at [facility], [address] [place], [place] [address]   Culture, blood (routine x 2)     Status: Abnormal   Collection Time: 11/19/18 11:22 AM   Specimen: BLOOD  Result Value Ref Range Status   Specimen Description BLOOD RIGHT ANTECUBITAL  Final   Special Requests   Final    AEROBIC BOTTLE ONLY Blood Culture results may not be optimal due to an excessive volume of blood received in culture bottles   Culture  Setup Time   Final    AEROBIC BOTTLE ONLY GRAM POSITIVE COCCI CRITICAL VALUE NOTED.  VALUE IS CONSISTENT WITH PREVIOUSLY REPORTED AND CALLED VALUE.    Culture (A)  Final    STAPHYLOCOCCUS AUREUS SUSCEPTIBILITIES PERFORMED ON PREVIOUS CULTURE WITHIN THE LAST 5 DAYS. Performed at [facility], [address] [place], [place] [address]    Report Status 11/20/2018 FINAL  Final  Culture, blood (routine x 2)     Status: Abnormal   Collection Time: 11/19/18 11:31 AM   Specimen: BLOOD RIGHT HAND  Result Value Ref Range Status   Specimen Description BLOOD RIGHT HAND  Final   Special Requests AEROBIC BOTTLE ONLY Blood Culture adequate volume  Final  Culture  Setup Time   Final    AEROBIC BOTTLE ONLY GRAM POSITIVE COCCI CRITICAL VALUE NOTED.  VALUE IS  CONSISTENT WITH PREVIOUSLY REPORTED AND CALLED VALUE.    Culture (A)  Final    STAPHYLOCOCCUS AUREUS SUSCEPTIBILITIES PERFORMED ON PREVIOUS CULTURE WITHIN THE LAST 5 DAYS. Performed at [facility], [place] [address], [place], [place] [address]    Report Status 11/20/2018 FINAL  Final  Culture, blood (Routine X 2) w Reflex to ID Panel     Status: None (Preliminary result)   Collection Time: 11/20/18  4:09 PM   Specimen: BLOOD LEFT HAND  Result Value Ref Range Status   Specimen Description BLOOD LEFT HAND  Final   Special Requests   Final    BOTTLES DRAWN AEROBIC ONLY Blood Culture adequate volume   Culture   Final    NO GROWTH 2 DAYS Performed at [facility], [place] [address], [place], [place] [address]    Report Status PENDING  Incomplete  Culture, blood (Routine X 2) w Reflex to ID Panel     Status: None (Preliminary result)   Collection Time: 11/20/18  4:09 PM   Specimen: BLOOD LEFT HAND  Result Value Ref Range Status   Specimen Description BLOOD LEFT HAND  Final   Special Requests   Final    BOTTLES DRAWN AEROBIC ONLY Blood Culture adequate volume   Culture   Final    NO GROWTH 2 DAYS Performed at [facility], [place] [address], [place], [place] [address]    Report Status PENDING  Incomplete    Studies/Results: US Renal  Result Date: 11/22/2018 CLINICAL DATA:  Acute kidney injury EXAM: RENAL / URINARY TRACT ULTRASOUND COMPLETE COMPARISON:  CT abdomen pelvis dated 11/17/2018 and renal ultrasound dated 06/28/2012. FINDINGS: Right Kidney: Renal measurements: 10.2 x 6.0 x 5.6 cm = volume: 179 mL . Echogenicity within normal limits. No mass is visualized. There is moderate hydronephrosis. Left Kidney: Renal measurements: 11.3 x 5.8 x 5.2 cm = volume: 176 mL. Echogenicity within normal limits. A 2.1 cm cyst is seen. No solid mass is visualized. There is moderate hydronephrosis. Bladder: Appears normal for degree of bladder distention. Patient was unable to void there  but urinary bladder. IMPRESSION: Moderate bilateral hydronephrosis. Electronically Signed   By: Zerita Boers M.D.   On: 11/22/2018 12:32    Assessment/Plan:  INTERVAL HISTORY:  Fever of 101.4 @3pm  on 10/8. WBC 12.4<<11.0<<12.3 CT surgery evaluated the pt yesterday, recommending continued medical management. Seen by PT, recommending SNF at discharge   Principal Problem:   Cerebral embolism with cerebral infarction Active Problems:   Nausea and vomiting   Bilateral hydronephrosis   Elevated troponin   Lactic acidosis   Chronic neck and back pain   Essential hypertension   Tachypnea   Postural dizziness with near syncope   Pain of upper abdomen   MSSA bacteremia   Septic embolism (HCC)   Endocarditis of mitral valve   JESSELYN RASK a 85 y.o.femalewith significantPMHofHTN, hypothyroidism, LBBB,arthritis,andGERDwhowas admitted fornausea/vomiting,orthostatic hypotension, and generalized weakness.Pt found to have Staph aureus bacteremia and L cerebellar stroke. TEE significant for small mobile echodensity attached to the posterior mitral valve leaflet. All consistent with left-sided native valve endocarditis.  Pt is not a candidate for CT surgery in light of recent stroke and risk for hemorrhagic conversion.   Blood cultures from 10/7 with no growth in 2 days. Will hold PICC placed till finalized.   Plan: 1. Continue  nafcillin 2g q4h 2. Will need PICC placement and outpatient ID appointment before discharge   LOS: 9 days   Thom Chimesllen Rayaan Garguilo 11/22/2018, 12:36 PM

## 2018-11-22 NOTE — Progress Notes (Signed)
PT Cancellation Note  Patient Details Name: Autumn [name] MRN: [id number] DOB: Nov 22, 1932   Cancelled Treatment:    Reason Eval/Treat Not Completed: Other (comment) Pt sleepy, unable to arouse longer than 5 seconds. States, "I don't want to do this." Education on importance of mobility when tolerated reiterated with son.   Autumn [name], PT, DPT Acute Rehabilitation Services Pager [phone] Office [phone]    Willy Eddy 11/22/2018, 2:44 PM

## 2018-11-22 NOTE — Progress Notes (Signed)
Urine culture and and samples sent to lab

## 2018-11-22 NOTE — Progress Notes (Addendum)
PROGRESS NOTE    Autumn KillianKaren M [name]  RUE:[id number] DOB: 09-22-1932 DOA: 11/12/2018 PCP: Merlene LaughterStoneking, Hal, MD     Brief Narrative:  Autumn [name] a 85 y.o.femalewith medical history significant ofhypertension, hypothyroidism,LBBB,arthritis,andGERD;who presents with complaints of nausea and vomiting. Wilford GristYesterday,she began feeling lightheaded as though she may pass out and was unable to walk while brushing her teeth. She was able to sit down on the toilet in the bathroom and became nauseated and vomited.Associated symptoms include complaints of generalized malaise, shortness of breath, achy neck, and achy back pain. She reports that the back and neck pain complaints are not new and related to her history of arthritis. She has not had any significant cough, fever, headache, change in vision, focal weakness, dysuria, or change in bowel habits.  ED Course:Upon admission into the emergency department patient was seen to be afebrile, pulse 80-106, respirations 15-30, blood pressures 137/94-172/74, and O2 saturation maintained on room air. Orthostatic vital signs were noted to be positive after standing for 3 minutes. Labs significant for MCV 103.4, MCH 34.4, sodium 131, chloride 97, CO2 18, BUN 21, creatinine 1.01, anion gap 16,lactic acid 4.6->3.8, and troponin42->76. CXRshowed no acute abnormality and UA negative for any signs of infection.Blood cultures were ordered. She was started on normal saline IV fluids at 100 mL/h.She has had no more nausea or vomiting since admission,but still does not feel well.  Hospital course complicated by elevated troponin, abnormal 2D echo with LVEF 25%, small to moderate pericardial effusion.  Seen by cardiology and patient declined aggressive cardiac work-up per cardiology.  Persistent abdominal discomfort. States up to 2 weeks prior to presentation was taking ibuprofen daily for months. Unintentional weight loss 5 lbs in the last 4-8 weeks. Seen by GI,  declined EGD per GI. Due to continued complaints of severe back pain, patient underwent MRI lumbar, thoracic spine, sacrum.  Neurosurgery consulted.  Blood cultures also resulted with staph aureus.  Patient underwent CT head which revealed left acute/subacute cerebellar ischemic infarct.  Neurology consulted.  New events last 24 hours / Subjective: No new issues overnight.  Appears to be very weak.  Patient admits that she has not been eating or drinking as much as she should be.  Assessment & Plan:   Principal Problem:   Cerebral embolism with cerebral infarction Active Problems:   Nausea and vomiting   Bilateral hydronephrosis   Elevated troponin   Lactic acidosis   Chronic neck and back pain   Essential hypertension   Tachypnea   Postural dizziness with near syncope   Pain of upper abdomen   MSSA bacteremia   Septic embolism (HCC)   Endocarditis of mitral valve   Acute ischemic left cerebellar CVA -CT head without contrast revealed acute/subacute nonhemorrhagic stroke involving the left superior cerebellar hemisphere extending into the left cerebellar peduncle  -MRI brain revealed large acute left cerebellar infarct without hemorrhage or mass-effect -Continue aspirin 325mg  daily and lipitor  -Neurology signed off 10/7, follow up in office in 4 weeks   Sepsis secondary to MSSA bacteremia, and endocarditis -Not POA -ID following -Nafcillin IV  -TEE 10/7 revealed posterior mitral valve echodensity suspicious of vegetation -Cardiothoracic surgery consulted  -Repeat blood cultures negative at 2 days -Hold off on PICC for now, until ok with ID   AKI -Creatinine jumped to 1.51 this morning.  She does have 1 documented episode of hypotension around 11:30 PM last night, 84/42.  Blood pressure improved after midnight. -Could be secondary to prerenal versus ATN.  Check  renal ultrasound.  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 Seen by vascular surgery,unlikely reason for symptoms -Self-reported daily use of NSAID, ibuprofenformonths and 5 pound unintentional weight loss the past 4 to8weeks. Stop NSAIDS.  -Seen by GI but patient declined EGD, GI signed off 10/1. Follow-up with GI outpatient -Continue Protonix 40 mg twice daily x6 weeks then 40 mg daily afterwards as recommended by GI  Intractable lower back pain in the setting of chronic back pain likely multifactorial secondary to severe stenosis at L3-L4 and MSSA bacteremia -MRI thoracic, lumbar, and sacrum done on 11/18/2018 showed severe stenosis at L3-L4 with disc bulge and central disc protrusion at T7-8. -Neurosurgery following   Acute on chronic combined systolic and diastolic CHF/small to moderate pericardial effusion. -Seen by Dr.Hochrein,per review of medical records on 04/10/2014 she had an ejection fraction of 35%,had a left heart cath in September 02, 2013 with history of nonischemic cardiomyopathy -2D echo done on 11/13/2018 showed LVEF 25% with diffuse hypokinesis and septal lateral dyssynchrony consistent with LBBB as well as grade 1 diastolic dysfunction -Seen by cardiology, patient declines aggressive cardiac work-up. Cardiology signed off 10/2 -Follow-up with Cardiology APP in 2 weeks, and Dr. Percival Spanish in 1-2 months  -Carvedilol 12.5 mg twice daily, losartan 100 mg daily, Lasix 20 mg as needed at discharge -Repeat echocardiogram in 3 to 6 months  Generalized weakness with physical debility likely in the setting of acute illness -PT OT recommended SNF  Essential hypertension complicated by orthostatic hypotension -No diuretics due to orthostatic hypotension -Euvolemic on exam -Normal BNP 65 on 11/12/2018 -Continue coreg, cozaar on hold due to AKI  Elevated troponin, likely demand ischemia -High-sensitivity troponin peaked at 196 and trended down -Seen by cardiology, no  plan for further ischemic work-up; patient declines aggressive cardiac work-up. -Repeated troponin on 11/18/18, 47 and trending down  HLD -Continue lipitor   Chronic euvolemic hyponatremia -Asymptomatic -Appears to have had hyponatremia since 2018 -Stable  Resolved urinary retention with bilateral hydronephrosis -Follow-up with urology; obtain referral from your primary care provider  Hypothyroidism  -Continue levothyroxine  GERD -Continue Protonix  Acute superficial vein thrombosis involving the left cephalic vein -Supportive care   Hypokalemia -Replace, trend   DVT prophylaxis: SCD Code Status: DNR Family Communication: None at bedside Disposition Plan: Pending further clinical improvement. SNF placement pending.    Consultants:   Vascular surgery  Cardiology  GI  Neurosurgery  Infectious disease  Neurology  Cardiothoracic surgery    Antimicrobials:  Anti-infectives (From admission, onward)   Start     Dose/Rate Route Frequency Ordered Stop   11/20/18 1115  vancomycin (VANCOCIN) IVPB 750 mg/150 ml premix  Status:  Discontinued     750 mg 150 mL/hr over 60 Minutes Intravenous Every 24 hours 11/19/18 1057 11/20/18 1010   11/20/18 1100  nafcillin 2 g in sodium chloride 0.9 % 100 mL IVPB     2 g 200 mL/hr over 30 Minutes Intravenous Every 4 hours 11/20/18 1010     11/19/18 1115  vancomycin (VANCOCIN) 1,750 mg in sodium chloride 0.9 % 500 mL IVPB     1,750 mg 250 mL/hr over 120 Minutes Intravenous  Once 11/19/18 1042 11/19/18 1419       Objective: Vitals:   11/21/18 2323 11/22/18 0008 11/22/18 0121 11/22/18 0742  BP: (!) 84/42 (!) 102/48  (!) 102/52  Pulse: 64 74  62  Resp: 18 19  16  Temp: 98.6 F (37 C)   (!) 97.5 F (36.4 C)  TempSrc: Oral   Oral  SpO2: 98%   92%  Weight:   73.8 kg   Height:        Intake/Output Summary (Last 24 hours) at 11/22/2018 1111 Last data filed at 11/22/2018 0600 Gross per 24 hour  Intake 758.72 ml  Output  -  Net 758.72 ml   Filed Weights   11/21/18 0300 11/21/18 0554 11/22/18 0121  Weight: 65.5 kg 71.9 kg 73.8 kg    Examination: General exam: Appears calm and comfortable, weak and frail appearing Respiratory system: Clear to auscultation. Respiratory effort normal. Cardiovascular system: S1 & S2 heard, RRR. No JVD, murmurs, rubs, gallops or clicks. No pedal edema. Gastrointestinal system: Abdomen is nondistended, soft and nontender. Normal bowel sounds heard. Central nervous system: Alert and oriented. No focal neurological deficits. Extremities: Symmetric  Skin: No rashes, lesions or ulcers on exposed skin Psychiatry: Judgement and insight appear normal. Mood & affect appropriate.    Data Reviewed: I have personally reviewed following labs and imaging studies  CBC: Recent Labs  Lab 11/17/18 1750 11/18/18 0531 11/20/18 0303 11/21/18 0327 11/22/18 0345  WBC 13.0* 16.7* 12.3* 11.0* 12.4*  NEUTROABS 10.4* 14.5*  --   --   --   HGB 13.5 12.1 10.0* 9.8* 10.2*  HCT 38.9 35.1* 29.2* 29.1* 30.1*  MCV 101.3* 100.6* 101.4* 100.7* 100.0  PLT 295 238 180 155 152   Basic Metabolic Panel: Recent Labs  Lab 11/17/18 1750 11/18/18 0531 11/20/18 0303 11/21/18 0327 11/22/18 0345  NA 131* 130* 127* 130* 132*  K 4.9 4.0 4.0 3.7 3.3*  CL 94* 95* 94* 96* 99  CO2 27 23 22  21* 20*  GLUCOSE 125* 133* 156* 146* 162*  BUN 19 18 22  26* 40*  CREATININE 0.81 0.94 0.85 0.92 1.51*  CALCIUM 9.4 8.8* 8.5* 8.2* 7.7*   GFR: Estimated Creatinine Clearance: 26.7 mL/min (A) (by C-G formula based on SCr of 1.51 mg/dL (H)). Liver Function Tests: Recent Labs  Lab 11/17/18 1750 11/18/18 0531  AST 32 37  ALT 25 23  ALKPHOS 50 44  BILITOT 0.8 0.3  PROT 7.1 6.2*  ALBUMIN 3.3* 2.7*   No results for input(s): LIPASE, AMYLASE in the last 168 hours. No results for input(s): AMMONIA in the last 168 hours. Coagulation Profile: No results for input(s): INR, PROTIME in the last 168 hours. Cardiac  Enzymes: Recent Labs  Lab 11/18/18 0531  CKTOTAL 428*   BNP (last 3 results) No results for input(s): PROBNP in the last 8760 hours. HbA1C: Recent Labs    11/19/18 1630  HGBA1C 5.9*   CBG: No results for input(s): GLUCAP in the last 168 hours. Lipid Profile: Recent Labs    11/19/18 1609  CHOL 178  HDL 66  LDLCALC 89  TRIG 114  CHOLHDL 2.7   Thyroid Function Tests: No results for input(s): TSH, T4TOTAL, FREET4, T3FREE, THYROIDAB in the last 72 hours. Anemia Panel: No results for input(s): VITAMINB12, FOLATE, FERRITIN, TIBC, IRON, RETICCTPCT in the last 72 hours. Sepsis Labs: Recent Labs  Lab 11/17/18 1750 11/17/18 2057 11/18/18 0531 11/18/18 0751  PROCALCITON <0.10  --   --   --   LATICACIDVEN 2.1* 3.3* 1.9 2.4*    Recent Results (from the past 240 hour(s))  Culture, blood (routine x 2)     Status: None   Collection Time: 11/12/18  7:02 PM   Specimen: BLOOD LEFT HAND  Result  Value Ref Range Status   Specimen Description BLOOD LEFT HAND  Final   Special Requests   Final    BOTTLES DRAWN AEROBIC ONLY Blood Culture adequate volume   Culture   Final    NO GROWTH 5 DAYS Performed at [facility], [address] [place], [place] [address]    Report Status 11/17/2018 FINAL  Final  Novel Coronavirus, NAA (Hosp order, Send-out to Ref Lab; TAT 18-24 hrs     Status: None   Collection Time: 11/16/18  4:51 PM   Specimen: Nasopharyngeal Swab; Respiratory  Result Value Ref Range Status   SARS-CoV-2, NAA NOT DETECTED NOT DETECTED Final    Comment: (NOTE) This nucleic acid amplification test was developed and its performance characteristics determined by [employer]. Nucleic acid amplification tests include PCR and TMA. This test has not been FDA cleared or approved. This test has been authorized by FDA under an Emergency Use Authorization (EUA). This test is only authorized for the duration of time the declaration that circumstances exist justifying  the authorization of the emergency use of in vitro diagnostic tests for detection of SARS-CoV-2 virus and/or diagnosis of COVID-19 infection under section 564(b)(1) of the Act, 21 U.S.C. 604VWU-9(W) (1), unless the authorization is terminated or revoked sooner. When diagnostic testing is negative, the possibility of a false negative result should be considered in the context of a patient's recent exposures and the presence of clinical signs and symptoms consistent with COVID-19. An individual without symptoms of COVID- 19 and who is not shedding SARS-CoV-2 vi rus would expect to have a negative (not detected) result in this assay. Performed At: [facility] [address] [place], [place] [id number] Jolene Schimke MD FA:[id number]    Coronavirus Source NASOPHARYNGEAL  Final    Comment: Performed at [facility], [address] [place], [place] [address]  Culture, blood (routine x 2)     Status: Abnormal   Collection Time: 11/18/18  7:45 AM   Specimen: BLOOD LEFT ARM  Result Value Ref Range Status   Specimen Description BLOOD LEFT ARM  Final   Special Requests   Final    BOTTLES DRAWN AEROBIC ONLY Blood Culture results may not be optimal due to an inadequate volume of blood received in culture bottles   Culture  Setup Time   Final    AEROBIC BOTTLE ONLY GRAM POSITIVE COCCI CRITICAL VALUE NOTED.  VALUE IS CONSISTENT WITH PREVIOUSLY REPORTED AND CALLED VALUE.    Culture (A)  Final    STAPHYLOCOCCUS AUREUS SUSCEPTIBILITIES PERFORMED ON PREVIOUS CULTURE WITHIN THE LAST 5 DAYS. Performed at [facility], [address], [place], [place] [address]    Report Status 11/20/2018 FINAL  Final  Culture, blood (routine x 2)     Status: Abnormal   Collection Time: 11/18/18  7:51 AM   Specimen: BLOOD LEFT HAND  Result Value Ref Range Status   Specimen Description BLOOD LEFT HAND  Final   Special Requests   Final    BOTTLES DRAWN AEROBIC ONLY Blood Culture results may not  be optimal due to an inadequate volume of blood received in culture bottles   Culture  Setup Time   Final    AEROBIC BOTTLE ONLY GRAM POSITIVE COCCI CRITICAL VALUE NOTED.  VALUE IS CONSISTENT WITH PREVIOUSLY REPORTED AND CALLED VALUE. Performed at [facility], [address], [place], [place] [address]    Culture STAPHYLOCOCCUS AUREUS (A)  Final   Report Status  11/20/2018 FINAL  Final   Organism ID, Bacteria STAPHYLOCOCCUS AUREUS  Final      Susceptibility   Staphylococcus aureus - MIC*    CIPROFLOXACIN <=0.5 SENSITIVE Sensitive     ERYTHROMYCIN <=0.25 SENSITIVE Sensitive     GENTAMICIN <=0.5 SENSITIVE Sensitive     OXACILLIN <=0.25 SENSITIVE Sensitive     TETRACYCLINE <=1 SENSITIVE Sensitive     VANCOMYCIN <=0.5 SENSITIVE Sensitive     TRIMETH/SULFA <=10 SENSITIVE Sensitive     CLINDAMYCIN <=0.25 SENSITIVE Sensitive     RIFAMPIN <=0.5 SENSITIVE Sensitive     Inducible Clindamycin NEGATIVE Sensitive     * STAPHYLOCOCCUS AUREUS  MRSA PCR Screening     Status: None   Collection Time: 11/19/18 10:48 AM   Specimen: Nasal Mucosa; Nasopharyngeal  Result Value Ref Range Status   MRSA by PCR NEGATIVE NEGATIVE Final    Comment:        The GeneXpert MRSA Assay (FDA approved for NASAL specimens only), is one component of a comprehensive MRSA colonization surveillance program. It is not intended to diagnose MRSA infection nor to guide or monitor treatment for MRSA infections. Performed at [facility], [address] [place], [place] [address]   Culture, blood (routine x 2)     Status: Abnormal   Collection Time: 11/19/18 11:22 AM   Specimen: BLOOD  Result Value Ref Range Status   Specimen Description BLOOD RIGHT ANTECUBITAL  Final   Special Requests   Final    AEROBIC BOTTLE ONLY Blood Culture results may not be optimal due to an excessive volume of blood received in culture bottles   Culture  Setup Time   Final    AEROBIC BOTTLE ONLY GRAM POSITIVE COCCI CRITICAL  VALUE NOTED.  VALUE IS CONSISTENT WITH PREVIOUSLY REPORTED AND CALLED VALUE.    Culture (A)  Final    STAPHYLOCOCCUS AUREUS SUSCEPTIBILITIES PERFORMED ON PREVIOUS CULTURE WITHIN THE LAST 5 DAYS. Performed at [facility], [address] [place], [place] [address]    Report Status 11/20/2018 FINAL  Final  Culture, blood (routine x 2)     Status: Abnormal   Collection Time: 11/19/18 11:31 AM   Specimen: BLOOD RIGHT HAND  Result Value Ref Range Status   Specimen Description BLOOD RIGHT HAND  Final   Special Requests AEROBIC BOTTLE ONLY Blood Culture adequate volume  Final   Culture  Setup Time   Final    AEROBIC BOTTLE ONLY GRAM POSITIVE COCCI CRITICAL VALUE NOTED.  VALUE IS CONSISTENT WITH PREVIOUSLY REPORTED AND CALLED VALUE.    Culture (A)  Final    STAPHYLOCOCCUS AUREUS SUSCEPTIBILITIES PERFORMED ON PREVIOUS CULTURE WITHIN THE LAST 5 DAYS. Performed at [facility], [address] [place], [place] [address]    Report Status 11/20/2018 FINAL  Final  Culture, blood (Routine X 2) w Reflex to ID Panel     Status: None (Preliminary result)   Collection Time: 11/20/18  4:09 PM   Specimen: BLOOD LEFT HAND  Result Value Ref Range Status   Specimen Description BLOOD LEFT HAND  Final   Special Requests   Final    BOTTLES DRAWN AEROBIC ONLY Blood Culture adequate volume   Culture   Final    NO GROWTH 2 DAYS Performed at [facility], [address] [place], [place] [address]    Report Status PENDING  Incomplete  Culture, blood (Routine X 2) w Reflex to ID Panel     Status: None (  Preliminary result)   Collection Time: 11/20/18  4:09 PM   Specimen: BLOOD LEFT HAND  Result Value Ref Range Status   Specimen Description BLOOD LEFT HAND  Final   Special Requests   Final    BOTTLES DRAWN AEROBIC ONLY Blood Culture adequate volume   Culture   Final    NO GROWTH 2 DAYS Performed at [facility], [address], [place], [place] [address]    Report Status PENDING   Incomplete      Radiology Studies: No results found.    Scheduled Meds: . aspirin EC  325 mg Oral Daily  . atorvastatin  20 mg Oral q1800  . carvedilol  12.5 mg Oral BID  . feeding supplement (ENSURE ENLIVE)  237 mL Oral BID BM  . influenza vaccine adjuvanted  0.5 mL Intramuscular Tomorrow-1000  . levothyroxine  50 mcg Oral QAC breakfast  . lidocaine  1 patch Transdermal Q24H  . pantoprazole  40 mg Oral BID  . senna-docusate  2 tablet Oral BID  . sodium chloride flush  3 mL Intravenous Q12H  . vitamin B-12  1,000 mcg Oral Daily   Continuous Infusions: . sodium chloride 100 mL/hr at 11/22/18 0952  . methocarbamol (ROBAXIN) IV 500 mg (11/17/18 2004)  . nafcillin IV 2 g (11/22/18 0951)     LOS: 9 days      Time spent: 25 minutes   Noralee Stain, DO [facility] 11/22/2018, 11:11 AM   Available via Epic secure chat 7am-7pm After these hours, please refer to coverage provider listed on [email or web address]

## 2018-11-23 DIAGNOSIS — N179 Acute kidney failure, unspecified: Secondary | ICD-10-CM

## 2018-11-23 DIAGNOSIS — B9561 Methicillin susceptible Staphylococcus aureus infection as the cause of diseases classified elsewhere: Secondary | ICD-10-CM

## 2018-11-23 DIAGNOSIS — R7881 Bacteremia: Secondary | ICD-10-CM

## 2018-11-23 LAB — BASIC METABOLIC PANEL
Anion gap: 14 (ref 5–15)
BUN: 56 mg/dL — ABNORMAL HIGH (ref 8–23)
CO2: 18 mmol/L — ABNORMAL LOW (ref 22–32)
Calcium: 7.6 mg/dL — ABNORMAL LOW (ref 8.9–10.3)
Chloride: 101 mmol/L (ref 98–111)
Creatinine, Ser: 2.3 mg/dL — ABNORMAL HIGH (ref 0.44–1.00)
GFR calc Af Amer: 22 mL/min — ABNORMAL LOW (ref >60)
GFR calc non Af Amer: 19 mL/min — ABNORMAL LOW (ref >60)
Glucose, Bld: 136 mg/dL — ABNORMAL HIGH (ref 70–99)
Potassium: 3.8 mmol/L (ref 3.5–5.1)
Sodium: 133 mmol/L — ABNORMAL LOW (ref 135–145)

## 2018-11-23 LAB — CBC
HCT: 29.9 % — ABNORMAL LOW (ref 36.0–46.0)
Hemoglobin: 10.6 g/dL — ABNORMAL LOW (ref 12.0–15.0)
MCH: 34.2 pg — ABNORMAL HIGH (ref 26.0–34.0)
MCHC: 35.5 g/dL (ref 30.0–36.0)
MCV: 96.5 fL (ref 80.0–100.0)
Platelets: 128 10*3/uL — ABNORMAL LOW (ref 150–400)
RBC: 3.1 MIL/uL — ABNORMAL LOW (ref 3.87–5.11)
RDW: 13.8 % (ref 11.5–15.5)
WBC: 18.5 10*3/uL — ABNORMAL HIGH (ref 4.0–10.5)
nRBC: 0 % (ref 0.0–0.2)

## 2018-11-23 LAB — UREA NITROGEN, URINE: Urea Nitrogen, Ur: 644 mg/dL

## 2018-11-23 MED ORDER — TRAZODONE HCL 50 MG PO TABS: 50.0000 mg | ORAL_TABLET | Freq: Every evening | ORAL | Status: DC | PRN

## 2018-11-23 MED ORDER — TRAZODONE HCL 100 MG PO TABS
100.0000 mg | ORAL_TABLET | Freq: Every evening | ORAL | Status: DC | PRN
  Administered 2018-11-24 – 2018-11-27 (×2): 100 mg via ORAL
  Filled 2018-11-23 (×2): qty 1

## 2018-11-23 NOTE — Progress Notes (Signed)
CSW called and spoke with Wendy with PT to see if they could schedule for the patients to be seen tomorrow for hopeful insurance authorization on Monday. She stated that she would put it in as needing social work request for insurance.   CSW will continue to follow and assist as needed.   Vermon Grays, MSW, LCSW-A Clinical Social Worker [facility]  [phone]  

## 2018-11-23 NOTE — Progress Notes (Signed)
PROGRESS NOTE    Autumn [name]  ZOX:[id number] DOB: 1932/05/23 DOA: 11/12/2018 PCP: Merlene Laughter, MD     Brief Narrative:  Autumn [name] a 85 y.o.femalewith medical history significant ofhypertension, hypothyroidism,LBBB,arthritis,andGERD;who presents with complaints of nausea and vomiting. Autumn [name] began feeling lightheaded as though she may pass out and was unable to walk while brushing her teeth. She was able to sit down on the toilet in the bathroom and became nauseated and vomited.Associated symptoms include complaints of generalized malaise, shortness of breath, achy neck, and achy back pain. She reports that the back and neck pain complaints are not new and related to her history of arthritis. She has not had any significant cough, fever, headache, change in vision, focal weakness, dysuria, or change in bowel habits.  ED Course:Upon admission into the emergency department patient was seen to be afebrile, pulse 80-106, respirations 15-30, blood pressures 137/94-172/74, and O2 saturation maintained on room air. Orthostatic vital signs were noted to be positive after standing for 3 minutes. Labs significant for MCV 103.4, MCH 34.4, sodium 131, chloride 97, CO2 18, BUN 21, creatinine 1.01, anion gap 16,lactic acid 4.6->3.8, and troponin42->76. CXRshowed no acute abnormality and UA negative for any signs of infection.Blood cultures were ordered. She was started on normal saline IV fluids at 100 mL/h.She has had no more nausea or vomiting since admission,but still does not feel well.  Hospital course complicated by elevated troponin, abnormal 2D echo with LVEF 25%, small to moderate pericardial effusion.  Seen by cardiology and patient declined aggressive cardiac work-up per cardiology.  Persistent abdominal discomfort. States up to 2 weeks prior to presentation was taking ibuprofen daily for months. Unintentional weight loss 5 lbs in the last 4-8 weeks. Seen by GI,  declined EGD per GI. Due to continued complaints of severe back pain, patient underwent MRI lumbar, thoracic spine, sacrum.  Neurosurgery consulted.  Blood cultures also resulted with staph aureus. ID consulted. Patient underwent CT head which revealed left acute/subacute cerebellar ischemic infarct.  Neurology consulted.   New events last 24 hours / Subjective: Complains of back pain, asking for pain meds.  Assessment & Plan:   Principal Problem:   Cerebral embolism with cerebral infarction Active Problems:   Nausea and vomiting   Bilateral hydronephrosis   Elevated troponin   Lactic acidosis   Chronic neck and back pain   Essential hypertension   Tachypnea   Postural dizziness with near syncope   Pain of upper abdomen   MSSA bacteremia   Septic embolism (HCC)   Endocarditis of mitral valve   AKI (acute kidney injury) (HCC)   Acute ischemic left cerebellar CVA -CT head without contrast revealed acute/subacute nonhemorrhagic stroke involving the left superior cerebellar hemisphere extending into the left cerebellar peduncle  -MRI brain revealed large acute left cerebellar infarct without hemorrhage or mass-effect -Continue aspirin  daily and lipitor  -Neurology signed off 10/7, follow up in office in 4 weeks   Sepsis secondary to MSSA bacteremia, and endocarditis -Not POA -ID following -TEE 10/7 revealed posterior mitral valve echodensity suspicious of vegetation -Cardiothoracic surgery consulted, nonsurgical candidate  -Repeat blood cultures negative at 2 days -Hold off on PICC for now, until repeat culture negative at 5 days  -Antibiotics changed to ceftriaxone in light of worsening kidney function   AKI -Creatinine jumped to 1.51 10/9.  She does have 1 documented episode of hypotension around 11:30 PM last night, 84/42.  Blood pressure improved after midnight. -FeNa 0.42% indicating pre-renal -Renal US revealed  bilateral moderate hydronephrosis. She has required 3x  in and out cath. She has history of urinary retention. Insert foley today, strict I/Os.  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 Seen by vascular surgery,unlikely reason for symptoms -Self-reported daily use of NSAID, ibuprofenformonths and 5 pound unintentional weight loss the past 4 to8weeks. Stop NSAIDS.  -Seen by GI but patient declined EGD, GI signed off 10/1. Follow-up with GI outpatient -Continue Protonix 40 mg twice daily x6 weeks then 40 mg daily afterwards as recommended by GI  Intractable lower back pain in the setting of chronic back pain likely multifactorial secondary to severe stenosis at L3-L4 and MSSA bacteremia -MRI thoracic, lumbar, and sacrum done on 11/18/2018 showed severe stenosis at L3-L4 with disc bulge and central disc protrusion at T7-8. -Neurosurgery following   Acute on chronic combined systolic and diastolic CHF/small to moderate pericardial effusion. -Seen by Dr.Hochrein,per review of medical records on 04/10/2014 she had an ejection fraction of 35%,had a left heart cath in September 02, 2013 with history of nonischemic cardiomyopathy -2D echo done on 11/13/2018 showed LVEF 25% with diffuse hypokinesis and septal lateral dyssynchrony consistent with LBBB as well as grade 1 diastolic dysfunction -Seen by cardiology, patient declines aggressive cardiac work-up. Cardiology signed off 10/2 -Follow-up with Cardiology APP in 2 weeks, and Dr. Percival Spanish in 1-2 months  -Carvedilol 12.5 mg twice daily, losartan 100 mg daily, Lasix 20 mg as needed at discharge -Repeat echocardiogram in 3 to 6 months  Generalized weakness with physical debility likely in the setting of acute illness -PT OT recommended SNF  Essential hypertension complicated by orthostatic hypotension -No diuretics due to orthostatic hypotension -Euvolemic on exam -Normal BNP 65 on  11/12/2018 -Continue coreg. Cozaar on hold due to AKI  Elevated troponin, likely demand ischemia -High-sensitivity troponin peaked at 196 and trended down -Seen by cardiology, no plan for further ischemic work-up; patient declines aggressive cardiac work-up. -Repeated troponin on 11/18/18, 47 and trended down  HLD -Continue lipitor   Chronic euvolemic hyponatremia -Asymptomatic -Appears to have had hyponatremia since 2018 -Stable  Hypothyroidism  -Continue levothyroxine  GERD -Continue Protonix  Acute superficial vein thrombosis involving the left cephalic vein -Supportive care    DVT prophylaxis: SCD Code Status: DNR Family Communication: None at bedside Disposition Plan: Pending further clinical improvement. SNF placement pending.    Consultants:   Vascular surgery  Cardiology  GI  Neurosurgery  Infectious disease  Neurology  Cardiothoracic surgery    Antimicrobials:  Anti-infectives (From admission, onward)   Start     Dose/Rate Route Frequency Ordered Stop   11/22/18 1430  cefTRIAXone (ROCEPHIN) 2 g in sodium chloride 0.9 % 100 mL IVPB     2 g 200 mL/hr over 30 Minutes Intravenous Every 12 hours 11/22/18 1339     11/20/18 1115  vancomycin (VANCOCIN) IVPB 750 mg/150 ml premix  Status:  Discontinued     750 mg 150 mL/hr over 60 Minutes Intravenous Every 24 hours 11/19/18 1057 11/20/18 1010   11/20/18 1100  nafcillin 2 g in sodium chloride 0.9 % 100 mL IVPB  Status:  Discontinued     2 g 200 mL/hr over 30 Minutes Intravenous Every 4 hours 11/20/18 1010 11/22/18 1339   11/19/18 1115  vancomycin (VANCOCIN) 1,750 mg in sodium chloride 0.9 % 500 mL IVPB     1,750 mg 250 mL/hr over 120 Minutes Intravenous  Once 11/19/18 1042 11/19/18 1419  Objective: Vitals:   11/22/18 2008 11/22/18 2355 11/23/18 0434 11/23/18 0816  BP: 135/71 (!) 153/67 (!) 116/54 135/66  Pulse: 70 89 79 78  Resp: 16 17 16 18   Temp: (!) 97.4 F (36.3 C) 97.9 F (36.6 C)  (!) 97.4 F (36.3 C) 98.2 F (36.8 C)  TempSrc: Oral Oral Oral Oral  SpO2: 93%  96% 95%  Weight:   78.4 kg   Height:        Intake/Output Summary (Last 24 hours) at 11/23/2018 0918 Last data filed at 11/23/2018 0819 Gross per 24 hour  Intake 2214.91 ml  Output 2152 ml  Net 62.91 ml   Filed Weights   11/21/18 0554 11/22/18 0121 11/23/18 0434  Weight: 71.9 kg 73.8 kg 78.4 kg    Examination: General exam: Appears calm, frail and weak appearing  Respiratory system: Clear to auscultation. Respiratory effort normal. Cardiovascular system: S1 & S2 heard, RRR. No pedal edema. Gastrointestinal system: Abdomen is nondistended, soft and nontender. Normal bowel sounds heard. Central nervous system: Alert and oriented Extremities: Symmetric Skin: No rashes, lesions or ulcers Psychiatry: Judgement and insight appear normal. Mood & affect appropriate.    Data Reviewed: I have personally reviewed following labs and imaging studies  CBC: Recent Labs  Lab 11/17/18 1750 11/18/18 0531 11/20/18 0303 11/21/18 0327 11/22/18 0345 11/23/18 0343  WBC 13.0* 16.7* 12.3* 11.0* 12.4* 18.5*  NEUTROABS 10.4* 14.5*  --   --   --   --   HGB 13.5 12.1 10.0* 9.8* 10.2* 10.6*  HCT 38.9 35.1* 29.2* 29.1* 30.1* 29.9*  MCV 101.3* 100.6* 101.4* 100.7* 100.0 96.5  PLT 295 238 180 155 152 128*   Basic Metabolic Panel: Recent Labs  Lab 11/18/18 0531 11/20/18 0303 11/21/18 0327 11/22/18 0345 11/23/18 0343  NA 130* 127* 130* 132* 133*  K 4.0 4.0 3.7 3.3* 3.8  CL 95* 94* 96* 99 101  CO2 23 22 21* 20* 18*  GLUCOSE 133* 156* 146* 162* 136*  BUN 18 22 26* 40* 56*  CREATININE 0.94 0.85 0.92 1.51* 2.30*  CALCIUM 8.8* 8.5* 8.2* 7.7* 7.6*   GFR: Estimated Creatinine Clearance: 18.1 mL/min (A) (by C-G formula based on SCr of 2.3 mg/dL (H)). Liver Function Tests: Recent Labs  Lab 11/17/18 1750 11/18/18 0531  AST 32 37  ALT 25 23  ALKPHOS 50 44  BILITOT 0.8 0.3  PROT 7.1 6.2*  ALBUMIN 3.3* 2.7*    No results for input(s): LIPASE, AMYLASE in the last 168 hours. No results for input(s): AMMONIA in the last 168 hours. Coagulation Profile: No results for input(s): INR, PROTIME in the last 168 hours. Cardiac Enzymes: Recent Labs  Lab 11/18/18 0531  CKTOTAL 428*   BNP (last 3 results) No results for input(s): PROBNP in the last 8760 hours. HbA1C: No results for input(s): HGBA1C in the last 72 hours. CBG: No results for input(s): GLUCAP in the last 168 hours. Lipid Profile: No results for input(s): CHOL, HDL, LDLCALC, TRIG, CHOLHDL, LDLDIRECT in the last 72 hours. Thyroid Function Tests: No results for input(s): TSH, T4TOTAL, FREET4, T3FREE, THYROIDAB in the last 72 hours. Anemia Panel: No results for input(s): VITAMINB12, FOLATE, FERRITIN, TIBC, IRON, RETICCTPCT in the last 72 hours. Sepsis Labs: Recent Labs  Lab 11/17/18 1750 11/17/18 2057 11/18/18 0531 11/18/18 0751  PROCALCITON <0.10  --   --   --   LATICACIDVEN 2.1* 3.3* 1.9 2.4*    Recent Results (from the past 240 hour(s))  Novel Coronavirus, NAA (Hosp order,  Send-out to Ref Lab; TAT 18-24 hrs     Status: None   Collection Time: 11/16/18  4:51 PM   Specimen: Nasopharyngeal Swab; Respiratory  Result Value Ref Range Status   SARS-CoV-2, NAA NOT DETECTED NOT DETECTED Final    Comment: (NOTE) This nucleic acid amplification test was developed and its performance characteristics determined by [employer]. Nucleic acid amplification tests include PCR and TMA. This test has not been FDA cleared or approved. This test has been authorized by FDA under an Emergency Use Authorization (EUA). This test is only authorized for the duration of time the declaration that circumstances exist justifying the authorization of the emergency use of in vitro diagnostic tests for detection of SARS-CoV-2 virus and/or diagnosis of COVID-19 infection under section 564(b)(1) of the Act, 21 U.S.C. 062IRS-8(N) (1), unless the  authorization is terminated or revoked sooner. When diagnostic testing is negative, the possibility of a false negative result should be considered in the context of a patient's recent exposures and the presence of clinical signs and symptoms consistent with COVID-19. An individual without symptoms of COVID- 19 and who is not shedding SARS-CoV-2 vi rus would expect to have a negative (not detected) result in this assay. Performed At: [facility] [address] [place], [place] [id number] Autumn Schimke MD XF:[id number]    Coronavirus Source NASOPHARYNGEAL  Final    Comment: Performed at [facility], [address], [place], [place] [address]  Culture, blood (routine x 2)     Status: Abnormal   Collection Time: 11/18/18  7:45 AM   Specimen: BLOOD LEFT ARM  Result Value Ref Range Status   Specimen Description BLOOD LEFT ARM  Final   Special Requests   Final    BOTTLES DRAWN AEROBIC ONLY Blood Culture results may not be optimal due to an inadequate volume of blood received in culture bottles   Culture  Setup Time   Final    AEROBIC BOTTLE ONLY GRAM POSITIVE COCCI CRITICAL VALUE NOTED.  VALUE IS CONSISTENT WITH PREVIOUSLY REPORTED AND CALLED VALUE.    Culture (A)  Final    STAPHYLOCOCCUS AUREUS SUSCEPTIBILITIES PERFORMED ON PREVIOUS CULTURE WITHIN THE LAST 5 DAYS. Performed at [facility], [address], [place], [place] [address]    Report Status 11/20/2018 FINAL  Final  Culture, blood (routine x 2)     Status: Abnormal   Collection Time: 11/18/18  7:51 AM   Specimen: BLOOD LEFT HAND  Result Value Ref Range Status   Specimen Description BLOOD LEFT HAND  Final   Special Requests   Final    BOTTLES DRAWN AEROBIC ONLY Blood Culture results may not be optimal due to an inadequate volume of blood received in culture bottles   Culture  Setup Time   Final    AEROBIC BOTTLE ONLY GRAM POSITIVE COCCI CRITICAL VALUE NOTED.  VALUE IS CONSISTENT WITH PREVIOUSLY  REPORTED AND CALLED VALUE. Performed at [facility], [address] [place], [place] [address]    Culture STAPHYLOCOCCUS AUREUS (A)  Final   Report Status 11/20/2018 FINAL  Final   Organism ID, Bacteria STAPHYLOCOCCUS AUREUS  Final      Susceptibility   Staphylococcus aureus - MIC*    CIPROFLOXACIN <=0.5 SENSITIVE Sensitive     ERYTHROMYCIN <=0.25 SENSITIVE Sensitive     GENTAMICIN <=0.5 SENSITIVE Sensitive     OXACILLIN <=0.25 SENSITIVE Sensitive     TETRACYCLINE <=1 SENSITIVE Sensitive     VANCOMYCIN <=0.5 SENSITIVE Sensitive  TRIMETH/SULFA <=10 SENSITIVE Sensitive     CLINDAMYCIN <=0.25 SENSITIVE Sensitive     RIFAMPIN <=0.5 SENSITIVE Sensitive     Inducible Clindamycin NEGATIVE Sensitive     * STAPHYLOCOCCUS AUREUS  MRSA PCR Screening     Status: None   Collection Time: 11/19/18 10:48 AM   Specimen: Nasal Mucosa; Nasopharyngeal  Result Value Ref Range Status   MRSA by PCR NEGATIVE NEGATIVE Final    Comment:        The GeneXpert MRSA Assay (FDA approved for NASAL specimens only), is one component of a comprehensive MRSA colonization surveillance program. It is not intended to diagnose MRSA infection nor to guide or monitor treatment for MRSA infections. Performed at [facility], [address] [place], [place] [address]   Culture, blood (routine x 2)     Status: Abnormal   Collection Time: 11/19/18 11:22 AM   Specimen: BLOOD  Result Value Ref Range Status   Specimen Description BLOOD RIGHT ANTECUBITAL  Final   Special Requests   Final    AEROBIC BOTTLE ONLY Blood Culture results may not be optimal due to an excessive volume of blood received in culture bottles   Culture  Setup Time   Final    AEROBIC BOTTLE ONLY GRAM POSITIVE COCCI CRITICAL VALUE NOTED.  VALUE IS CONSISTENT WITH PREVIOUSLY REPORTED AND CALLED VALUE.    Culture (A)  Final    STAPHYLOCOCCUS AUREUS SUSCEPTIBILITIES PERFORMED ON PREVIOUS CULTURE WITHIN THE LAST 5 DAYS. Performed at  [facility], [address], [place], [place] [address]    Report Status 11/20/2018 FINAL  Final  Culture, blood (routine x 2)     Status: Abnormal   Collection Time: 11/19/18 11:31 AM   Specimen: BLOOD RIGHT HAND  Result Value Ref Range Status   Specimen Description BLOOD RIGHT HAND  Final   Special Requests AEROBIC BOTTLE ONLY Blood Culture adequate volume  Final   Culture  Setup Time   Final    AEROBIC BOTTLE ONLY GRAM POSITIVE COCCI CRITICAL VALUE NOTED.  VALUE IS CONSISTENT WITH PREVIOUSLY REPORTED AND CALLED VALUE.    Culture (A)  Final    STAPHYLOCOCCUS AUREUS SUSCEPTIBILITIES PERFORMED ON PREVIOUS CULTURE WITHIN THE LAST 5 DAYS. Performed at [facility], [address], [place], [place] [address]    Report Status 11/20/2018 FINAL  Final  Culture, blood (Routine X 2) w Reflex to ID Panel     Status: None (Preliminary result)   Collection Time: 11/20/18  4:09 PM   Specimen: BLOOD LEFT HAND  Result Value Ref Range Status   Specimen Description BLOOD LEFT HAND  Final   Special Requests   Final    BOTTLES DRAWN AEROBIC ONLY Blood Culture adequate volume   Culture   Final    NO GROWTH 2 DAYS Performed at [facility], [address], [place], [place] [address]    Report Status PENDING  Incomplete  Culture, blood (Routine X 2) w Reflex to ID Panel     Status: None (Preliminary result)   Collection Time: 11/20/18  4:09 PM   Specimen: BLOOD LEFT HAND  Result Value Ref Range Status   Specimen Description BLOOD LEFT HAND  Final   Special Requests   Final    BOTTLES DRAWN AEROBIC ONLY Blood Culture adequate volume   Culture   Final    NO GROWTH 2 DAYS Performed at [facility], [address] [place], [place] [address]  Report Status PENDING  Incomplete      Radiology Studies: [place] Renal  Result Date: 11/22/2018 CLINICAL DATA:  Acute kidney injury EXAM: RENAL / URINARY TRACT ULTRASOUND COMPLETE COMPARISON:  CT abdomen pelvis dated 11/17/2018 and  renal ultrasound dated 06/28/2012. FINDINGS: Right Kidney: Renal measurements: 10.2 x 6.0 x 5.6 cm = volume: 179 mL . Echogenicity within normal limits. No mass is visualized. There is moderate hydronephrosis. Left Kidney: Renal measurements: 11.3 x 5.8 x 5.2 cm = volume: 176 mL. Echogenicity within normal limits. A 2.1 cm cyst is seen. No solid mass is visualized. There is moderate hydronephrosis. Bladder: Appears normal for degree of bladder distention. Patient was unable to void there but urinary bladder. IMPRESSION: Moderate bilateral hydronephrosis. Electronically Signed   By: Romona Curlsyler  Litton M.D.   On: 11/22/2018 12:32      Scheduled Meds:  aspirin EC  325 mg Oral Daily   atorvastatin  20 mg Oral q1800   carvedilol  12.5 mg Oral BID   feeding supplement (ENSURE ENLIVE)  237 mL Oral BID BM   influenza vaccine adjuvanted  0.5 mL Intramuscular Tomorrow-1000   levothyroxine  50 mcg Oral QAC breakfast   lidocaine  1 patch Transdermal Q24H   pantoprazole  40 mg Oral BID   senna-docusate  2 tablet Oral BID   sodium chloride flush  3 mL Intravenous Q12H   vitamin B-12  1,000 mcg Oral Daily   Continuous Infusions:  sodium chloride 100 mL/hr at 11/23/18 0500   cefTRIAXone (ROCEPHIN)  IV 2 g (11/23/18 0223)   methocarbamol (ROBAXIN) IV 500 mg (11/17/18 2004)     LOS: 10 days      Time spent: 25 minutes   Noralee StainJennifer Ilyaas Musto, DO [facility] 11/23/2018, 9:18 AM   Available via Epic secure chat 7am-7pm After these hours, please refer to coverage provider listed on [email or web address]

## 2018-11-23 NOTE — Consult Note (Signed)
Consultation Note Date: 11/23/2018   Patient Name: Autumn [name]  DOB: 1933/01/15  MRN: [id number]  Age / Sex: 85 y.o., female  PCP: Autumn Manes, MD Referring Physician: Dessa Phi, DO  Reason for Consultation: Establishing goals of care and Psychosocial/spiritual support  HPI/Patient Profile: 85 y.o. female  with past medical history of hypertension hypothyroidism, left bundle branch, arthritis, who was admitted on 11/12/2018 with nausea and vomiting.  Initial work-up revealed lactic acidosis (4.6), MSSA bacteremia, and elevated troponin, bilateral nephrosis, and hyponatremia.  Her hospitalization has been complicated by a large left cerebellar infarct, endocarditis, intractable nausea and abdominal pain and progressive weakness.  The patient has been taking in minimal oral nutrition and her pain has been too severe to work with physical therapy.  Clinical Assessment and Goals of Care:  I have reviewed medical records including EPIC notes, labs and imaging, received report from Dr. Maylene [name], assessed the patient and then spoke with her about her wants and values.  I then called her son Autumn [name] to discuss diagnosis prognosis, [place], EOL wishes, disposition and options.  I introduced Palliative Medicine as specialized medical care for people living with serious illness. It focuses on providing relief from the symptoms and stress of a serious illness. The goal is to improve quality of life for both the patient and the family.  We discussed a brief life review of the patient.  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  She is [place] and has 2 sons Autumn [name] and Autumn [name].  She currently lives in independent living.  Per Autumn [name] prior to admission she was shopping, walking around and independent.  As far as functional and nutritional status  currently [name] is not eating well and too weak to feed herself. I asked her how I could help her and she stated "these doctors want to do all of these tests to me and I do not want it.  I do not know how to make them stop ".  I explained her that the tests are to help her get better however, at a certain point in an individual's life they may decide to stop these tests and just to be comfortable knowing that they are in the final stages of their life.  [name] agreed.  She indicated that she wants to go home and be comfortable.  She does not want to return to the hospital.  She does not want to have to go to the doctor's office.  Then I spoke with her about treatment for her heart valve infection.  Specifically I talked with her about a PICC line being placed on Monday for long-term antibiotics.  She seemed to be okay with this idea, and she told me that she wanted to be discharged to [place] after her hospital stay.  These goals seemed to be somewhat incongruent to me.  I talked with [name] about her pain.  It is located in her  lower back, it is constant and aching.  Heat does not help it.  Hydrocodone has helped in the past.  She is feeling better today after having some hydrocodone and several bowel movements.  She has also had a Foley catheter placed which helped her urinary retention.  I asked [name] if 1 of her sons helps more in her medical decision making.  She indicated Autumn [name] would be the correct son to contact.  I asked permission to call Autumn [name] and invite him and his brother to the hospital for a conversation about her goals and wants in order to form a plan for her care in the future.  She is agreeable to this.  I spoke with her son Autumn [name] on the phone.  Autumn [name] seemed somewhat agitated about doctors discussing her prognosis in front of her.  He was also disappointed by the effort put into her physical therapy.  He feels that his mother is not ready to give up that she  wants to get better, and when she is refusing tests she does not know what she is doing.  We talked for a while and agreed to meet tomorrow morning at 1030 to discuss his mother's medical care and goals in more detail.   Primary Decision Maker:  PATIENT.  With support from her son Autumn [name].  Her Default healthcare power of attorney would be her 2 sons together.    SUMMARY OF RECOMMENDATIONS     PMT goals of care meeting with sons on 10/11 at 1030.  Recommend patient receive hydrocodone 1 hour before working with physical therapy.  Patient requested a stronger sleeping medication.  I ordered trazodone 100 mg as she had 50 mg before  Agree with tramadol for moderate pain and hydrocodone for severe pain as needed   Code Status/Advance Care Planning:  DNR   Symptom Management:   Bowel regimen in place  Additional Recommendations (Limitations, Scope, Preferences):  Full Scope Treatment -to be discussed  Palliative Prophylaxis:   Frequent Pain Assessment  Psycho-social/Spiritual:   Desire for further Chaplaincy support: Welcomed, [facility]  Prognosis: Difficult to ascertain.  She has had one thing after another recently.  If her kidney function improves her endocarditis clears and her p.o. intake improves she may have many months.  However if anyone of these 3 things continues to worsen her prognosis is likely weeks.  Discharge Planning: Skilled Nursing Facility for rehab with Palliative care service follow-up patient expresses a preference for [facility] skilled nursing facility      Primary Diagnoses: Present on Admission: . Nausea and vomiting . Bilateral hydronephrosis . Elevated troponin . Lactic acidosis . Chronic neck and back pain . Essential hypertension . Tachypnea   I have reviewed the medical record, interviewed the patient and family, and examined the patient. The following aspects are pertinent.  Past Medical History:  Diagnosis Date  . Anxiety   .  GERD (gastroesophageal reflux disease)   . Hypertension   . Hypothyroid   . LBBB (left bundle branch block)   . MRSA bacteremia 11/19/2018  . Osteopenia   . Peripheral neuropathy   . Venous insufficiency    Social History   Socioeconomic History  . Marital status: Widowed    Spouse name: Not on file  . Number of children: 2  . Years of education: Not on file  . Highest education level: Not on file  Occupational History  . Not on file  Social Needs  . Financial resource strain: Not on file  .  Food insecurity    Worry: Not on file    Inability: Not on file  . Transportation needs    Medical: Not on file    Non-medical: Not on file  Tobacco Use  . Smoking status: Never Smoker  . Smokeless tobacco: Never Used  Substance and Sexual Activity  . Alcohol use: Yes    Frequency: Never    Comment: Once every three months   . Drug use: No  . Sexual activity: Not on file  Lifestyle  . Physical activity    Days per week: Not on file    Minutes per session: Not on file  . Stress: Not on file  Relationships  . Social Musician on phone: Not on file    Gets together: Not on file    Attends religious service: Not on file    Active member of club or organization: Not on file    Attends meetings of clubs or organizations: Not on file    Relationship status: Not on file  Other Topics Concern  . Not on file  Social History Narrative   Lives alone.     Family History  Problem Relation Age of Onset  . Hypertension Mother   . Prostate cancer Father    Scheduled Meds: . aspirin EC  325 mg Oral Daily  . atorvastatin  20 mg Oral q1800  . carvedilol  12.5 mg Oral BID  . feeding supplement (ENSURE ENLIVE)  237 mL Oral BID BM  . influenza vaccine adjuvanted  0.5 mL Intramuscular Tomorrow-1000  . levothyroxine  50 mcg Oral QAC breakfast  . lidocaine  1 patch Transdermal Q24H  . pantoprazole  40 mg Oral BID  . senna-docusate  2 tablet Oral BID  . sodium chloride flush  3  mL Intravenous Q12H  . vitamin B-12  1,000 mcg Oral Daily   Continuous Infusions: . sodium chloride 125 mL/hr at 11/23/18 0923  . cefTRIAXone (ROCEPHIN)  IV 2 g (11/23/18 1459)  . methocarbamol (ROBAXIN) IV 500 mg (11/17/18 2004)   PRN Meds:.acetaminophen **OR** acetaminophen, albuterol, alum & mag hydroxide-simeth, diclofenac sodium, HYDROcodone-acetaminophen, labetalol, LORazepam, methocarbamol (ROBAXIN) IV, naLOXone (NARCAN)  injection, traMADol, traZODone Allergies  Allergen Reactions  . Hydrochlorothiazide Other (See Comments)    REACTION: hyponatremia  . Ramipril Other (See Comments)    REACTION: GI upset  . Remeron [Mirtazapine] Other (See Comments)    REACTION: weakness  . Sulfa Antibiotics Rash   Review of Systems complains of low back pain, weakness, insomnia  Physical Exam  Pleasant elderly female, appears very weak, speaks in a very soft voice CV regular rate and rhythm Respiratory no distress Abdomen mildly firm, nontender nondistended  Vital Signs: BP (!) 128/58 (BP Location: Left Arm)   Pulse 78   Temp 99.3 F (37.4 C) (Oral)   Resp 15   Ht 5\' 3"  (1.6 m)   Wt 78.4 kg   SpO2 93%   BMI 30.62 kg/m  Pain Scale: 0-10 POSS *See Group Information*: 1-Acceptable,Awake and alert Pain Score: 6    SpO2: SpO2: 93 % O2 Device:SpO2: 93 % O2 Flow Rate: .O2 Flow Rate (L/min): 2 L/min  IO: Intake/output summary:   Intake/Output Summary (Last 24 hours) at 11/23/2018 1537 Last data filed at 11/23/2018 0819 Gross per 24 hour  Intake 2214.91 ml  Output 2152 ml  Net 62.91 ml    LBM: Last BM Date: 11/17/18 Baseline Weight: Weight: 65.7 kg Most recent weight: Weight: 78.4 kg  Palliative Assessment/Data: 20%     Time In: 2 PM Time Out: 3:15 PM Time Total: 75 minutes Visit consisted of counseling and education dealing with the complex and emotionally intense issues surrounding the need for palliative care and symptom management in the setting of serious and  potentially life-threatening illness. Greater than 50%  of this time was spent counseling and coordinating care related to the above assessment and plan.  Signed by: Norvel Richards, PA-C Palliative Medicine Pager: [phone]  Please contact Palliative Medicine Team phone at [phone] for questions and concerns.  For individual provider: See Loretha Stapler

## 2018-11-24 DIAGNOSIS — I63449 Cerebral infarction due to embolism of unspecified cerebellar artery: Secondary | ICD-10-CM

## 2018-11-24 DIAGNOSIS — Z515 Encounter for palliative care: Secondary | ICD-10-CM

## 2018-11-24 LAB — CBC
HCT: 25.8 % — ABNORMAL LOW (ref 36.0–46.0)
Hemoglobin: 9.4 g/dL — ABNORMAL LOW (ref 12.0–15.0)
MCH: 34.9 pg — ABNORMAL HIGH (ref 26.0–34.0)
MCHC: 36.4 g/dL — ABNORMAL HIGH (ref 30.0–36.0)
MCV: 95.9 fL (ref 80.0–100.0)
Platelets: 118 10*3/uL — ABNORMAL LOW (ref 150–400)
RBC: 2.69 MIL/uL — ABNORMAL LOW (ref 3.87–5.11)
RDW: 14.5 % (ref 11.5–15.5)
WBC: 14 10*3/uL — ABNORMAL HIGH (ref 4.0–10.5)
nRBC: 0 % (ref 0.0–0.2)

## 2018-11-24 LAB — BASIC METABOLIC PANEL
Anion gap: 11 (ref 5–15)
BUN: 42 mg/dL — ABNORMAL HIGH (ref 8–23)
CO2: 19 mmol/L — ABNORMAL LOW (ref 22–32)
Calcium: 7.6 mg/dL — ABNORMAL LOW (ref 8.9–10.3)
Chloride: 103 mmol/L (ref 98–111)
Creatinine, Ser: 1.9 mg/dL — ABNORMAL HIGH (ref 0.44–1.00)
GFR calc Af Amer: 28 mL/min — ABNORMAL LOW (ref >60)
GFR calc non Af Amer: 24 mL/min — ABNORMAL LOW (ref >60)
Glucose, Bld: 141 mg/dL — ABNORMAL HIGH (ref 70–99)
Potassium: 2.9 mmol/L — ABNORMAL LOW (ref 3.5–5.1)
Sodium: 133 mmol/L — ABNORMAL LOW (ref 135–145)

## 2018-11-24 MED ORDER — POTASSIUM CHLORIDE CRYS ER 20 MEQ PO TBCR
40.0000 meq | EXTENDED_RELEASE_TABLET | ORAL | Status: AC
  Administered 2018-11-24 (×2): 40 meq via ORAL
  Filled 2018-11-24 (×2): qty 2

## 2018-11-24 MED ORDER — LORAZEPAM 0.5 MG PO TABS
0.5000 mg | ORAL_TABLET | Freq: Every day | ORAL | Status: DC
  Administered 2018-11-24 – 2018-11-27 (×4): 0.5 mg via ORAL
  Filled 2018-11-24 (×4): qty 1

## 2018-11-24 MED ORDER — OXYCODONE-ACETAMINOPHEN 5-325 MG PO TABS
1.0000 | ORAL_TABLET | Freq: Four times a day (QID) | ORAL | Status: DC | PRN
  Administered 2018-11-24 – 2018-11-28 (×7): 1 via ORAL
  Filled 2018-11-24 (×7): qty 1

## 2018-11-24 MED ORDER — TRAMADOL HCL 50 MG PO TABS
50.0000 mg | ORAL_TABLET | Freq: Four times a day (QID) | ORAL | Status: DC | PRN
  Administered 2018-11-25: 50 mg via ORAL
  Filled 2018-11-24: qty 1

## 2018-11-24 NOTE — Progress Notes (Signed)
Physical Therapy Treatment Patient Details Name: Autumn [name] MRN: [id number] DOB: [birth year] Today's Date: 11/24/2018    History of Present Illness 85 y.o. female with past medical history significant for HTN, LBBB, and GERD who presents to the ED via EMS from [facility] center for acute onset neck and back pain as well as vomiting. In ED became orthostatic with 3 minutes of standing and found to have elevated troponing levels. Admitted for observation 9/29.    PT Comments    Pt reluctant but agreeable to move with therapy today. Pt son Autumn [name] present throughout session. Pt reports 5/10 pain in back prior to movement. Pt limited in safe mobility by pain, decreased strength and endurance.  Pt requires modA for coming to EoB with increase in back pain to 7/10 due to inability to maintain sidelying to come to seated. Once in sitting pain subsided back to 5/10. Pt requires total A to come to standing but able to progress to standing with modAx2 for approx 1 min. Pt requires max A to return to supine with maintenance of sidelying and no increase in back pain. Pt bed placed in chair position and pt able to participate in therapeutic exercise. D/c plan remains appropriate. PT will continue to follow acutely.    Follow Up Recommendations  SNF;Supervision for mobility/OOB     Equipment Recommendations  Rolling walker with 5" wheels    Recommendations for Other Services       Precautions / Restrictions Precautions Precautions: Fall Precaution Comments: severe back pain Restrictions Weight Bearing Restrictions: No    Mobility  Bed Mobility Overal bed mobility: Needs Assistance Bed Mobility: Rolling Rolling: Mod assist   Supine to sit: Mod assist   Sit to sidelying: Max assist General bed mobility comments: modA for management of LE off bed, difficulty with bringing trunk to upright in sidelying so increased pain with movement to EoB. Educated on sidelying to improve back  alignment for less pain with transitions.  Transfers Overall transfer level: Needs assistance Equipment used: Rolling walker (2 wheeled) Transfers: Sit to/from Stand Sit to Stand: Total assist;Mod assist         General transfer comment: total A to power up to standing, however with maximal cuing for posterior pelvic tilt and chest expansion pt able to maintain standing with modA for ~1 minute  Ambulation/Gait             General Gait Details: unable to attempt due to weakness and R sided pain with weightbearing   Stairs             Wheelchair Mobility    Modified Rankin (Stroke Patients Only)       Balance Overall balance assessment: Needs assistance   Sitting balance-Leahy Scale: Fair       Standing balance-Leahy Scale: Poor Standing balance comment: dependent on physical assist                            Cognition Arousal/Alertness: Awake/alert Behavior During Therapy: Flat affect Overall Cognitive Status: Impaired/Different from baseline Area of Impairment: Problem solving;Following commands;Safety/judgement;Orientation                       Following Commands: Follows one step commands with increased time;Follows one step commands inconsistently Safety/Judgement: Decreased awareness of safety   Problem Solving: Slow processing;Requires verbal cues;Requires tactile cues;Decreased initiation General Comments: significant delay with processing/following commands, ultimately able to participate  Exercises General Exercises - Lower Extremity Ankle Circles/Pumps: AROM;Both;10 reps;Seated Long Arc Quad: AROM;Both;5 reps;Seated Other Exercises Other Exercises: trunk rotation and UE elevation, requires assist on L side due to weakness and decreased coordination    General Comments General comments (skin integrity, edema, etc.): BP stable with movement today, no hypotension, or dizziness, SaO2 >90%O2 on 3L via [place]       Pertinent Vitals/Pain Pain Assessment: 0-10 Pain Score: 5  Pain Location: R side back and leg Pain Descriptors / Indicators: Pressure;Constant;Grimacing Pain Intervention(s): Limited activity within patient's tolerance;Monitored during session;Repositioned           PT Goals (current goals can now be found in the care plan section) Acute Rehab PT Goals Patient Stated Goal: to lay back down and not move PT Goal Formulation: With patient Time For Goal Achievement: 11/27/18 Potential to Achieve Goals: Good Progress towards PT goals: Progressing toward goals    Frequency    Min 3X/week      PT Plan Current plan remains appropriate       AM-PAC PT "6 Clicks" Mobility   Outcome Measure  Help needed turning from your back to your side while in a flat bed without using bedrails?: Total Help needed moving from lying on your back to sitting on the side of a flat bed without using bedrails?: Total Help needed moving to and from a bed to a chair (including a wheelchair)?: Total Help needed standing up from a chair using your arms (e.g., wheelchair or bedside chair)?: Total Help needed to walk in hospital room?: Total Help needed climbing 3-5 steps with a railing? : Total 6 Click Score: 6    End of Session Equipment Utilized During Treatment: Gait belt Activity Tolerance: Patient limited by pain Patient left: in bed;with call bell/phone within reach;with bed alarm set;Other (comment)(placed in chair position ) Nurse Communication: Mobility status PT Visit Diagnosis: Muscle weakness (generalized) (M62.81);Difficulty in walking, not elsewhere classified (R26.2);Other abnormalities of gait and mobility (R26.89)     Time: 6213-0865 PT Time Calculation (min) (ACUTE ONLY): 33 min  Charges:  $Therapeutic Exercise: 8-22 mins $Therapeutic Activity: 8-22 mins                     Aaira Oestreicher B. Beverely Risen PT, DPT Acute Rehabilitation Services Pager [phone] Office [phone]    [place]on Alas [place] 11/24/2018, 1:50 PM

## 2018-11-24 NOTE — Progress Notes (Signed)
PT Cancellation Note  Patient Details Name: Autumn [name] MRN: [id number] DOB: [birth year]   Cancelled Treatment:    Reason Eval/Treat Not Completed: (P) Other (comment)(pt family and Palliative care in room for mtg) PT will follow back this afternoon as able.  [name] B. Migdalia Dk PT, DPT Acute Rehabilitation Services Pager [phone] Office [phone]    [place] 11/24/2018, 10:56 AM

## 2018-11-24 NOTE — Progress Notes (Signed)
Received call that pt had ST elevation MD made aware  EKG done. Pt. denied SOB, chest pain ,Vs stable . No acute distress RN will continue to monitor.

## 2018-11-24 NOTE — Progress Notes (Signed)
PROGRESS NOTE    Autumn [name]  WGN:[id number] DOB: [birth year] DOA: 11/12/2018 PCP: Merlene Laughter, MD     Brief Narrative:  Autumn [name] a 85 y.o.femalewith medical history significant ofhypertension, hypothyroidism,LBBB,arthritis,andGERD;who presents with complaints of nausea and vomiting. Autumn [name] began feeling lightheaded as though she may pass out and was unable to walk while brushing her teeth. She was able to sit down on the toilet in the bathroom and became nauseated and vomited.Associated symptoms include complaints of generalized malaise, shortness of breath, achy neck, and achy back pain. She reports that the back and neck pain complaints are not new and related to her history of arthritis. She has not had any significant cough, fever, headache, change in vision, focal weakness, dysuria, or change in bowel habits.  ED Course:Upon admission into the emergency department patient was seen to be afebrile, pulse 80-106, respirations 15-30, blood pressures 137/94-172/74, and O2 saturation maintained on room air. Orthostatic vital signs were noted to be positive after standing for 3 minutes. Labs significant for MCV 103.4, MCH 34.4, sodium 131, chloride 97, CO2 18, BUN 21, creatinine 1.01, anion gap 16,lactic acid 4.6->3.8, and troponin42->76. CXRshowed no acute abnormality and UA negative for any signs of infection.Blood cultures were ordered. She was started on normal saline IV fluids at 100 mL/h.She has had no more nausea or vomiting since admission,but still does not feel well.  Hospital course complicated by elevated troponin, abnormal 2D echo with LVEF 25%, small to moderate pericardial effusion.  Seen by cardiology and patient declined aggressive cardiac work-up per cardiology.  Persistent abdominal discomfort. States up to 2 weeks prior to presentation was taking ibuprofen daily for months. Unintentional weight loss 5 lbs in the last 4-8 weeks. Seen by GI,  declined EGD per GI. Due to continued complaints of severe back pain, patient underwent MRI lumbar, thoracic spine, sacrum.  Neurosurgery consulted.  Blood cultures also resulted with staph aureus. ID consulted. Patient underwent CT head which revealed left acute/subacute cerebellar ischemic infarct.  Neurology consulted.   New events last 24 hours / Subjective: Complains of back pain, taking morning medications this morning. 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)   Acute ischemic left cerebellar CVA -CT head without contrast revealed acute/subacute nonhemorrhagic stroke involving the left superior cerebellar hemisphere extending into the left cerebellar peduncle  -MRI brain revealed large acute left cerebellar infarct without hemorrhage or mass-effect -Continue aspirin 325mg  daily and lipitor  -Neurology signed off 10/7, follow up in office in 4 weeks   Sepsis secondary to MSSA bacteremia, and endocarditis -Not POA -ID following -TEE 10/7 revealed posterior mitral valve echodensity suspicious of vegetation -Cardiothoracic surgery consulted, nonsurgical candidate  -Repeat blood cultures negative at 3 days -Hold off on PICC for now, until repeat culture negative at 5 days  -Antibiotics changed to ceftriaxone in light of worsening kidney function   AKI -Creatinine jumped to 1.51 10/9.  She does have 1 documented episode of hypotension around  11:30 PM last night, 84/42.  Blood pressure improved after midnight. -FeNa 0.42% indicating pre-renal -Renal US revealed bilateral moderate hydronephrosis. She has required 3x in and out cath. She has history of urinary retention. Insert foley today, strict I/Os.  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 Seen by vascular surgery,unlikely reason for symptoms -Self-reported daily use of NSAID, ibuprofenformonths and 5 pound unintentional weight loss the past 4 to8weeks. Stop NSAIDS.  -Seen by GI but patient declined EGD, GI signed off 10/1. Follow-up with GI outpatient -Continue Protonix 40 mg twice daily x6 weeks then 40 mg daily afterwards as recommended by GI  Intractable lower back pain in the setting of chronic back pain likely multifactorial secondary to severe stenosis at L3-L4 and MSSA bacteremia -MRI thoracic, lumbar, and sacrum done on 11/18/2018 showed severe stenosis at L3-L4 with disc bulge and central disc protrusion at T7-8. -Neurosurgery following   Acute on chronic combined systolic and diastolic CHF/small to moderate pericardial effusion. -Seen by Dr.Hochrein,per review of medical records on 04/10/2014 she had an ejection fraction of 35%,had a left heart cath in September 02, 2013 with history of nonischemic cardiomyopathy -2D echo done on 11/13/2018 showed LVEF 25% with diffuse hypokinesis and septal lateral dyssynchrony consistent with LBBB as well as grade 1 diastolic dysfunction -Seen by cardiology, patient declines aggressive cardiac work-up. Cardiology signed off 10/2 -Follow-up with Cardiology APP in 2 weeks, and Dr. Antoine PocheHochrein in 1-2 months  -Carvedilol 12.5 mg twice daily, losartan 100 mg daily, Lasix 20 mg as needed at discharge -Repeat echocardiogram in 3 to 6 months  Generalized weakness with physical debility likely in the  setting of acute illness -PT OT recommended SNF  Essential hypertension complicated by orthostatic hypotension -No diuretics due to orthostatic hypotension -Euvolemic on exam -Normal BNP 65 on 11/12/2018 -Continue coreg. Cozaar on hold due to AKI  Elevated troponin, likely demand ischemia -High-sensitivity troponin peaked at 196 and trended down -Seen by cardiology, no plan for further ischemic work-up; patient declines aggressive cardiac work-up. -Repeated troponin on 11/18/18, 47 and trended down  HLD -Continue lipitor   Chronic euvolemic hyponatremia -Asymptomatic -Appears to have had hyponatremia since 2018 -Stable  Hypothyroidism  -Continue levothyroxine  GERD -Continue Protonix  Acute superficial vein thrombosis involving the left cephalic vein -Supportive care   Hypokalemia -Replace, trend   DVT prophylaxis: SCD Code Status: DNR Family Communication: None at bedside Disposition Plan: Pending further clinical improvement. SNF placement pending.  Palliative care meeting later this morning   Consultants:   Vascular surgery  Cardiology  GI  Neurosurgery  Infectious disease  Neurology  Cardiothoracic surgery   Palliative care   Antimicrobials:  Anti-infectives (From admission, onward)   Start     Dose/Rate Route Frequency Ordered Stop   11/22/18 1430  cefTRIAXone (ROCEPHIN) 2 g in sodium chloride 0.9 % 100 mL IVPB     2 g 200 mL/hr over 30 Minutes Intravenous Every 12 hours 11/22/18 1339     11/20/18 1115  vancomycin (VANCOCIN) IVPB 750 mg/150 ml premix  Status:  Discontinued     750 mg 150 mL/hr over 60 Minutes Intravenous Every 24 hours 11/19/18 1057 11/20/18 1010   11/20/18 1100  nafcillin 2 g in sodium chloride 0.9 % 100 mL IVPB  Status:  Discontinued     2 g 200 mL/hr over 30  Minutes Intravenous Every 4 hours 11/20/18 1010 11/22/18 1339   11/19/18 1115  vancomycin (VANCOCIN) 1,750 mg in sodium chloride 0.9 % 500 mL IVPB     1,750  mg 250 mL/hr over 120 Minutes Intravenous  Once 11/19/18 1042 11/19/18 1419       Objective: Vitals:   11/24/18 0240 11/24/18 0400 11/24/18 0500 11/24/18 0805  BP: (!) 150/67 (!) 142/63  (!) 156/73  Pulse: 79 72  80  Resp:  16  16  Temp: 97.9 F (36.6 C) 98.4 F (36.9 C)  98.2 F (36.8 C)  TempSrc: Oral Oral  Oral  SpO2:  90%  91%  Weight:   76.1 kg   Height:        Intake/Output Summary (Last 24 hours) at 11/24/2018 0941 Last data filed at 11/24/2018 0656 Gross per 24 hour  Intake 1250 ml  Output 2850 ml  Net -1600 ml   Filed Weights   11/22/18 0121 11/23/18 0434 11/24/18 0500  Weight: 73.8 kg 78.4 kg 76.1 kg    Examination: General exam: Appears calm and comfortable, frail appearing Respiratory system: Clear to auscultation. Respiratory effort normal. Cardiovascular system: S1 & S2 heard, RRR. No pedal edema. Gastrointestinal system: Abdomen is nondistended, soft and nontender. Normal bowel sounds heard. GU: + Foley catheter in place draining clear yellow urine Central nervous system: Alert and oriented. Non focal exam. Speech clear  Extremities: Symmetric in appearance bilaterally  Skin: No rashes, lesions or ulcers on exposed skin  Psychiatry: Judgement and insight appear stable. Mood & affect appropriate.    Data Reviewed: I have personally reviewed following labs and imaging studies  EKG 10/11 reviewed independently.  Normal sinus rhythm with left bundle branch block.  CBC: Recent Labs  Lab 11/17/18 1750 11/18/18 0531 11/20/18 0303 11/21/18 0327 11/22/18 0345 11/23/18 0343 11/24/18 0353  WBC 13.0* 16.7* 12.3* 11.0* 12.4* 18.5* 14.0*  NEUTROABS 10.4* 14.5*  --   --   --   --   --   HGB 13.5 12.1 10.0* 9.8* 10.2* 10.6* 9.4*  HCT 38.9 35.1* 29.2* 29.1* 30.1* 29.9* 25.8*  MCV 101.3* 100.6* 101.4* 100.7* 100.0 96.5 95.9  PLT 295 238 180 155 152 128* 118*   Basic Metabolic Panel: Recent Labs  Lab 11/20/18 0303 11/21/18 0327 11/22/18 0345  11/23/18 0343 11/24/18 0353  NA 127* 130* 132* 133* 133*  K 4.0 3.7 3.3* 3.8 2.9*  CL 94* 96* 99 101 103  CO2 22 21* 20* 18* 19*  GLUCOSE 156* 146* 162* 136* 141*  BUN 22 26* 40* 56* 42*  CREATININE 0.85 0.92 1.51* 2.30* 1.90*  CALCIUM 8.5* 8.2* 7.7* 7.6* 7.6*   GFR: Estimated Creatinine Clearance: 21.5 mL/min (A) (by C-G formula based on SCr of 1.9 mg/dL (H)). Liver Function Tests: Recent Labs  Lab 11/17/18 1750 11/18/18 0531  AST 32 37  ALT 25 23  ALKPHOS 50 44  BILITOT 0.8 0.3  PROT 7.1 6.2*  ALBUMIN 3.3* 2.7*   No results for input(s): LIPASE, AMYLASE in the last 168 hours. No results for input(s): AMMONIA in the last 168 hours. Coagulation Profile: No results for input(s): INR, PROTIME in the last 168 hours. Cardiac Enzymes: Recent Labs  Lab 11/18/18 0531  CKTOTAL 428*   BNP (last 3 results) No results for input(s): PROBNP in the last 8760 hours. HbA1C: No results for input(s): HGBA1C in the last 72 hours. CBG: No results for input(s): GLUCAP in the last 168 hours. Lipid Profile: No results for input(s):  CHOL, HDL, LDLCALC, TRIG, CHOLHDL, LDLDIRECT in the last 72 hours. Thyroid Function Tests: No results for input(s): TSH, T4TOTAL, FREET4, T3FREE, THYROIDAB in the last 72 hours. Anemia Panel: No results for input(s): VITAMINB12, FOLATE, FERRITIN, TIBC, IRON, RETICCTPCT in the last 72 hours. Sepsis Labs: Recent Labs  Lab 11/17/18 1750 11/17/18 2057 11/18/18 0531 11/18/18 0751  PROCALCITON <0.10  --   --   --   LATICACIDVEN 2.1* 3.3* 1.9 2.4*    Recent Results (from the past 240 hour(s))  Novel Coronavirus, NAA (Hosp order, Send-out to Ref Lab; TAT 18-24 hrs     Status: None   Collection Time: 11/16/18  4:51 PM   Specimen: Nasopharyngeal Swab; Respiratory  Result Value Ref Range Status   SARS-CoV-2, NAA NOT DETECTED NOT DETECTED Final    Comment: (NOTE) This nucleic acid amplification test was developed and its performance characteristics  determined by [employer]. Nucleic acid amplification tests include PCR and TMA. This test has not been FDA cleared or approved. This test has been authorized by FDA under an Emergency Use Authorization (EUA). This test is only authorized for the duration of time the declaration that circumstances exist justifying the authorization of the emergency use of in vitro diagnostic tests for detection of SARS-CoV-2 virus and/or diagnosis of COVID-19 infection under section 564(b)(1) of the Act, 21 U.S.C. 694WNI-6(E) (1), unless the authorization is terminated or revoked sooner. When diagnostic testing is negative, the possibility of a false negative result should be considered in the context of a patient's recent exposures and the presence of clinical signs and symptoms consistent with COVID-19. An individual without symptoms of COVID- 19 and who is not shedding SARS-CoV-2 vi rus would expect to have a negative (not detected) result in this assay. Performed At: [facility] [address] [place], [place] [id number] Rush Farmer MD HW:[id number]    [place]  Final    Comment: Performed at [facility], [place] [address] [place], [place] [address]  Culture, blood (routine x 2)     Status: Abnormal   Collection Time: 11/18/18  7:45 AM   Specimen: BLOOD LEFT ARM  Result Value Ref Range Status   Specimen Description BLOOD LEFT ARM  Final   Special Requests   Final    BOTTLES DRAWN AEROBIC ONLY Blood Culture results may not be optimal due to an inadequate volume of blood received in culture bottles   Culture  Setup Time   Final    AEROBIC BOTTLE ONLY GRAM POSITIVE COCCI CRITICAL VALUE NOTED.  VALUE IS CONSISTENT WITH PREVIOUSLY REPORTED AND CALLED VALUE.    Culture (A)  Final    STAPHYLOCOCCUS AUREUS SUSCEPTIBILITIES PERFORMED ON PREVIOUS CULTURE WITHIN THE LAST 5 DAYS. Performed at [facility], [place] [address] [place], [place]  [address]    Report Status 11/20/2018 FINAL  Final  Culture, blood (routine x 2)     Status: Abnormal   Collection Time: 11/18/18  7:51 AM   Specimen: BLOOD LEFT HAND  Result Value Ref Range Status   Specimen Description BLOOD LEFT HAND  Final   Special Requests   Final    BOTTLES DRAWN AEROBIC ONLY Blood Culture results may not be optimal due to an inadequate volume of blood received in culture bottles   Culture  Setup Time   Final    AEROBIC BOTTLE ONLY GRAM POSITIVE COCCI CRITICAL VALUE NOTED.  VALUE IS CONSISTENT WITH PREVIOUSLY REPORTED AND CALLED VALUE. Performed at [facility], [address], [place], [place] [address]    Culture STAPHYLOCOCCUS AUREUS (A)  Final   Report Status 11/20/2018 FINAL  Final   Organism ID, Bacteria STAPHYLOCOCCUS AUREUS  Final      Susceptibility   Staphylococcus aureus - MIC*    CIPROFLOXACIN <=0.5 SENSITIVE Sensitive     ERYTHROMYCIN <=0.25 SENSITIVE Sensitive     GENTAMICIN <=0.5 SENSITIVE Sensitive     OXACILLIN <=0.25 SENSITIVE Sensitive     TETRACYCLINE <=1 SENSITIVE Sensitive     VANCOMYCIN <=0.5 SENSITIVE Sensitive     TRIMETH/SULFA <=10 SENSITIVE Sensitive     CLINDAMYCIN <=0.25 SENSITIVE Sensitive     RIFAMPIN <=0.5 SENSITIVE Sensitive     Inducible Clindamycin NEGATIVE Sensitive     * STAPHYLOCOCCUS AUREUS  MRSA PCR Screening     Status: None   Collection Time: 11/19/18 10:48 AM   Specimen: Nasal Mucosa; Nasopharyngeal  Result Value Ref Range Status   MRSA by PCR NEGATIVE NEGATIVE Final    Comment:        The GeneXpert MRSA Assay (FDA approved for NASAL specimens only), is one component of a comprehensive MRSA colonization surveillance program. It is not intended to diagnose MRSA infection nor to guide or monitor treatment for MRSA infections. Performed at [facility], [address], [place], [place] [address]   Culture, blood (routine x 2)     Status: Abnormal   Collection Time: 11/19/18 11:22 AM   Specimen:  BLOOD  Result Value Ref Range Status   Specimen Description BLOOD RIGHT ANTECUBITAL  Final   Special Requests   Final    AEROBIC BOTTLE ONLY Blood Culture results may not be optimal due to an excessive volume of blood received in culture bottles   Culture  Setup Time   Final    AEROBIC BOTTLE ONLY GRAM POSITIVE COCCI CRITICAL VALUE NOTED.  VALUE IS CONSISTENT WITH PREVIOUSLY REPORTED AND CALLED VALUE.    Culture (A)  Final    STAPHYLOCOCCUS AUREUS SUSCEPTIBILITIES PERFORMED ON PREVIOUS CULTURE WITHIN THE LAST 5 DAYS. Performed at [facility], [address], [place], [place] [address]    Report Status 11/20/2018 FINAL  Final  Culture, blood (routine x 2)     Status: Abnormal   Collection Time: 11/19/18 11:31 AM   Specimen: BLOOD RIGHT HAND  Result Value Ref Range Status   Specimen Description BLOOD RIGHT HAND  Final   Special Requests AEROBIC BOTTLE ONLY Blood Culture adequate volume  Final   Culture  Setup Time   Final    AEROBIC BOTTLE ONLY GRAM POSITIVE COCCI CRITICAL VALUE NOTED.  VALUE IS CONSISTENT WITH PREVIOUSLY REPORTED AND CALLED VALUE.    Culture (A)  Final    STAPHYLOCOCCUS AUREUS SUSCEPTIBILITIES PERFORMED ON PREVIOUS CULTURE WITHIN THE LAST 5 DAYS. Performed at [facility], [address] [place], [place] [address]    Report Status 11/20/2018 FINAL  Final  Culture, blood (Routine X 2) w Reflex to ID Panel     Status: None (Preliminary result)   Collection Time: 11/20/18  4:09 PM   Specimen: BLOOD LEFT HAND  Result Value Ref Range Status   Specimen Description BLOOD LEFT HAND  Final   Special Requests   Final    BOTTLES DRAWN AEROBIC ONLY Blood Culture adequate volume   Culture   Final    NO GROWTH 3 DAYS Performed at [facility], [address], [place], [place] [address]    Report  Status PENDING  Incomplete  Culture, blood (Routine X 2) w Reflex to ID Panel     Status: None (Preliminary result)   Collection Time: 11/20/18  4:09 PM    Specimen: BLOOD LEFT HAND  Result Value Ref Range Status   Specimen Description BLOOD LEFT HAND  Final   Special Requests   Final    BOTTLES DRAWN AEROBIC ONLY Blood Culture adequate volume   Culture   Final    NO GROWTH 3 DAYS Performed at [facility], [address] [place], [place] [id number]    Report Status PENDING  Incomplete      Radiology Studies: No results found.    Scheduled Meds:  aspirin EC  325 mg Oral Daily   atorvastatin  20 mg Oral q1800   carvedilol  12.5 mg Oral BID   feeding supplement (ENSURE ENLIVE)  237 mL Oral BID BM   influenza vaccine adjuvanted  0.5 mL Intramuscular Tomorrow-1000   levothyroxine  50 mcg Oral QAC breakfast   lidocaine  1 patch Transdermal Q24H   pantoprazole  40 mg Oral BID   potassium chloride  40 mEq Oral Q4H   senna-docusate  2 tablet Oral BID   sodium chloride flush  3 mL Intravenous Q12H   vitamin B-12  1,000 mcg Oral Daily   Continuous Infusions:  sodium chloride 125 mL/hr at 11/24/18 0455   cefTRIAXone (ROCEPHIN)  IV 2 g (11/24/18 0207)   methocarbamol (ROBAXIN) IV 500 mg (11/17/18 2004)     LOS: 11 days      Time spent: 25 minutes   Noralee StainJennifer Cornelious Bartolucci, DO [facility] 11/24/2018, 9:41 AM   Available via Epic secure chat 7am-7pm After these hours, please refer to coverage provider listed on [email or web address]

## 2018-11-24 NOTE — Progress Notes (Signed)
Daily Progress Note   Patient Name: Autumn [name]       Date: 11/24/2018 DOB: 05/19/1932  Age: 85 y.o. MRN#: [id number] Attending Physician: Dessa Phi, DO Primary Care Physician: Lajean Manes, MD Admit Date: 11/12/2018  Reason for Consultation/Follow-up: Establishing goals of care and Psychosocial/spiritual support  Subjective: Examined patient at bedside - she feels better today.  Slept well last night.  Had another bowel movement.  Ate a little applesauce.  After conferring with Dr. Linus Salmons and thoroughly reviewing her chart, I had an extended conversation in a private conference room with her two sons. We reviewed her endocarditis, MSSA bacteremia, renal failure, bilateral hydronephrosis, orthostatic hypotension, cerebellar CVA, severe back pain, CHF, abdominal pain and nausea, her refusal of work up and therapies as well as her decreased PO intake.  Autumn [name] and Autumn [name] explain that two weeks ago Autumn [name] was walking and doing her own grocery shopping.  All of these health problems seemed to have fallen out of the sky.  Her two sons are hopeful that this all came on very quickly and possibly some of it can be reversed.  Indeed her echo on 10/7 show a much better LVEF than on 9/30.  Her creatinine is beginning to improve.  He back pain is controlled and her overall outlook appears to be better.  I cautioned her sons that while things are improving at the moment Autumn [name] is very fragile - it would only take one more thing to send her towards end of life.  Her sons understood.  They reflected that their mother is a very independent woman who would not want to depend on others to care for her.  She is a very practical woman who tends to be a "glass half empty" type of thinker.  Autumn [name] took  care of her mother thru a prolonged death and she does not want that for herself.  We discussed that if everything continues to go well and PICC line will likely be placed on Monday and she will discharge to SNF ([facility]) early next week.  Her sons let me know that she is accustomed to taking 0.5 ativan every night at bedtime.   They also felt her pain would be better controlled and energy would improve  on percocet rather than hydrocodone.   Assessment: Patient smiling  today.  Appears improved.  Following commands, eating a bit.   Patient Profile/HPI:  85 y.o. female  with past medical history of hypertension hypothyroidism, left bundle branch, arthritis, who was admitted on 11/12/2018 with nausea and vomiting.  Initial work-up revealed lactic acidosis (4.6), MSSA bacteremia, and elevated troponin, bilateral nephrosis, and hyponatremia.  Her hospitalization has been complicated by a large left cerebellar infarct, endocarditis, intractable nausea and abdominal pain and progressive weakness.  The patient has been taking in minimal oral nutrition and her pain has been too severe to work with physical therapy.   Length of Stay: 11  Current Medications: Scheduled Meds:  . aspirin EC  325 mg Oral Daily  . atorvastatin  20 mg Oral q1800  . carvedilol  12.5 mg Oral BID  . feeding supplement (ENSURE ENLIVE)  237 mL Oral BID BM  . influenza vaccine adjuvanted  0.5 mL Intramuscular Tomorrow-1000  . levothyroxine  50 mcg Oral QAC breakfast  . lidocaine  1 patch Transdermal Q24H  . pantoprazole  40 mg Oral BID  . senna-docusate  2 tablet Oral BID  . sodium chloride flush  3 mL Intravenous Q12H  . vitamin B-12  1,000 mcg Oral Daily    Continuous Infusions: . sodium chloride 125 mL/hr at 11/24/18 0455  . cefTRIAXone (ROCEPHIN)  IV 2 g (11/24/18 0207)  . methocarbamol (ROBAXIN) IV 500 mg (11/17/18 2004)    PRN Meds: acetaminophen **OR** acetaminophen, albuterol, alum & mag hydroxide-simeth,  diclofenac sodium, HYDROcodone-acetaminophen, labetalol, LORazepam, methocarbamol (ROBAXIN) IV, naLOXone (NARCAN)  injection, traMADol, traZODone  Physical Exam        Well developed female, awake, alert, cooperative NAD CV rrr Resp no increased work of breathing Abdomen soft, nt, nd  Vital Signs: BP 136/80 (BP Location: Left Arm)   Pulse 80   Temp 98.2 F (36.8 C) (Oral)   Resp 16   Ht 5\' 3"  (1.6 m)   Wt 76.1 kg   SpO2 91%   BMI 29.72 kg/m  SpO2: SpO2: 91 % O2 Device: O2 Device: Room Air O2 Flow Rate: O2 Flow Rate (L/min): 2 L/min  Intake/output summary:   Intake/Output Summary (Last 24 hours) at 11/24/2018 1352 Last data filed at 11/24/2018 1257 Gross per 24 hour  Intake 1250 ml  Output 3850 ml  Net -2600 ml   LBM: Last BM Date: 11/24/18 Baseline Weight: Weight: 65.7 kg Most recent weight: Weight: 76.1 kg       Palliative Assessment/Data: 30%      Patient Active Problem List   Diagnosis Date Noted  . MRSA bacteremia   . 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 AKI (acute kidney injury) (HCC)   . Cerebral embolism with cerebral infarction 11/20/2018  . Endocarditis of mitral valve   . MSSA bacteremia 11/19/2018  . Septic embolism (HCC)   . Pain of upper abdomen   . Postural dizziness with near syncope 11/13/2018  . Tachypnea   . Nausea and vomiting 11/12/2018  . Bilateral hydronephrosis 11/12/2018  . Elevated troponin 11/12/2018  . Lactic acidosis 11/12/2018  . Chronic neck and back pain 11/12/2018  . Essential hypertension 11/12/2018  . Normal coronary arteries 09/19/2013  . LBBB (left bundle branch block) 09/19/2013  . Congestive dilated cardiomyopathy (HCC) 07/24/2013    Palliative Care Plan    Recommendations/Plan:  Will give ativan qhs as she had at home  Will switch hydrocodone to percocet  PLease put in DC summary "Palliative Care to follow outpatient  at SNF"  MOST form completed:  DNR, Limited  interventions, no permanent feeding tube  DC to SNF - [facility] when medically appropriate.  Sons wanted to know what follow up appointments patient will have (Cardiology, GI, Urology, Infectious Disease) in order to confirm that she keeps those appointments while at [facility].  Goals of Care and Additional Recommendations:  Limitations on Scope of Treatment: Full Scope Treatment  Code Status:  DNR  Prognosis:   Unable to determine. Patient has a great deal stacked against her at this point but none of it is insurmountable. She is at high risk for an acute decline and re-admission.    Discharge Planning:  Skilled Nursing Facility for rehab with Palliative care service follow-up  Care plan was discussed with sons  Thank you for allowing the Palliative Medicine Team to assist in the care of this patient.  Total time spent:  90 min. Time in:  10:30 Time out:  12:30     Greater than 50%  of this time was spent counseling and coordinating care related to the above assessment and plan.  Norvel Richards, PA-C Palliative Medicine  Please contact Palliative MedicineTeam phone at [phone] for questions and concerns between 7 am - 7 pm.   Please see AMION for individual provider pager numbers.

## 2018-11-25 ENCOUNTER — Inpatient Hospital Stay: Payer: Self-pay

## 2018-11-25 DIAGNOSIS — U071 COVID-19: Secondary | ICD-10-CM

## 2018-11-25 DIAGNOSIS — I63442 Cerebral infarction due to embolism of left cerebellar artery: Secondary | ICD-10-CM

## 2018-11-25 DIAGNOSIS — M545 Low back pain: Secondary | ICD-10-CM

## 2018-11-25 LAB — BASIC METABOLIC PANEL
Anion gap: 8 (ref 5–15)
BUN: 29 mg/dL — ABNORMAL HIGH (ref 8–23)
CO2: 19 mmol/L — ABNORMAL LOW (ref 22–32)
Calcium: 7.9 mg/dL — ABNORMAL LOW (ref 8.9–10.3)
Chloride: 108 mmol/L (ref 98–111)
Creatinine, Ser: 1.54 mg/dL — ABNORMAL HIGH (ref 0.44–1.00)
GFR calc Af Amer: 36 mL/min — ABNORMAL LOW (ref >60)
GFR calc non Af Amer: 31 mL/min — ABNORMAL LOW (ref >60)
Glucose, Bld: 140 mg/dL — ABNORMAL HIGH (ref 70–99)
Potassium: 4.2 mmol/L (ref 3.5–5.1)
Sodium: 135 mmol/L (ref 135–145)

## 2018-11-25 LAB — CULTURE, BLOOD (ROUTINE X 2)
Culture: NO GROWTH
Culture: NO GROWTH
Special Requests: ADEQUATE
Special Requests: ADEQUATE

## 2018-11-25 LAB — CBC
HCT: 28.2 % — ABNORMAL LOW (ref 36.0–46.0)
Hemoglobin: 9.7 g/dL — ABNORMAL LOW (ref 12.0–15.0)
MCH: 33.3 pg (ref 26.0–34.0)
MCHC: 34.4 g/dL (ref 30.0–36.0)
MCV: 96.9 fL (ref 80.0–100.0)
Platelets: 154 10*3/uL (ref 150–400)
RBC: 2.91 MIL/uL — ABNORMAL LOW (ref 3.87–5.11)
RDW: 15 % (ref 11.5–15.5)
WBC: 17 10*3/uL — ABNORMAL HIGH (ref 4.0–10.5)
nRBC: 0 % (ref 0.0–0.2)

## 2018-11-25 LAB — SARS CORONAVIRUS 2 BY RT PCR (HOSPITAL ORDER, PERFORMED IN ~~LOC~~ HOSPITAL LAB): SARS Coronavirus 2: NEGATIVE

## 2018-11-25 LAB — NOVEL CORONAVIRUS, NAA (HOSP ORDER, SEND-OUT TO REF LAB; TAT 18-24 HRS): SARS-CoV-2, NAA: DETECTED — AB

## 2018-11-25 NOTE — TOC Progression Note (Signed)
Transition of Care [facility]) - Progression Note    Patient Details  Name: Autumn [name] MRN: [id number] Date of Birth: 23-Nov-1932  Transition of [facility] CM/SW [place], [place] Phone Number: 11/25/2018, 2:24 PM  Clinical Narrative:   CSW alerted earlier this morning that patient will be getting picc and will be ready for SNF soon. CSW checked in with [facility] admissions this morning, they have a bed for patient and started authorization on Friday; awaiting decision from [facility].   CSW later updated by RN that patient's COVID test came back positive. CSW confirmed with [facility] that they are unable to take patient now that she is testing positive; patient will need a new SNF. Awaiting updated COVID test to confirm positive status. CSW to continue to follow.    Expected Discharge Plan: Skilled Nursing Facility Barriers to Discharge: Other (comment), Continued Medical Work up, Insurance underwriter Authorization(tested positive for [employer])  Expected Discharge Plan and Services Expected Discharge Plan: [place] In-house Referral: NA Discharge Planning Services: NA Post Acute Care Choice: [place] Living arrangements for the past 2 months: Single Family Home Expected Discharge Date: 11/15/18                                     Social Determinants of Health (SDOH) Interventions    Readmission Risk Interventions No flowsheet data found.

## 2018-11-25 NOTE — Progress Notes (Signed)
PROGRESS NOTE    Autumn KillianKaren M [name]  ZOX:[id number] DOB: Jul 11, 1932 DOA: 11/12/2018 PCP: Merlene LaughterStoneking, Hal, MD     Brief Narrative:  Autumn RaiderKaren M Kroneis a 85 y.o.femalewith medical history significant ofhypertension, hypothyroidism,LBBB,arthritis,andGERD;who presents with complaints of nausea and vomiting. Autumn GristYesterday,she began feeling lightheaded as though she may pass out and was unable to walk while brushing her teeth. She was able to sit down on the toilet in the bathroom and became nauseated and vomited.Associated symptoms include complaints of generalized malaise, shortness of breath, achy neck, and achy back pain. She reports that the back and neck pain complaints are not new and related to her history of arthritis. She has not had any significant cough, fever, headache, change in vision, focal weakness, dysuria, or change in bowel habits.  ED Course:Upon admission into the emergency department patient was seen to be afebrile, pulse 80-106, respirations 15-30, blood pressures 137/94-172/74, and O2 saturation maintained on room air. Orthostatic vital signs were noted to be positive after standing for 3 minutes. Labs significant for MCV 103.4, MCH 34.4, sodium 131, chloride 97, CO2 18, BUN 21, creatinine 1.01, anion gap 16,lactic acid 4.6->3.8, and troponin42->76. CXRshowed no acute abnormality and UA negative for any signs of infection.Blood cultures were ordered. She was started on normal saline IV fluids at 100 mL/h.She has had no more nausea or vomiting since admission,but still does not feel well.  Hospital course complicated by elevated troponin, abnormal 2D echo with LVEF 25%, small to moderate pericardial effusion.  Seen by cardiology and patient declined aggressive cardiac work-up per cardiology.  Persistent abdominal discomfort. States up to 2 weeks prior to presentation was taking ibuprofen daily for months. Unintentional weight loss 5 lbs in the last 4-8 weeks. Seen by GI,  declined EGD per GI. Due to continued complaints of severe back pain, patient underwent MRI lumbar, thoracic spine, sacrum.  Neurosurgery consulted.  Blood cultures also resulted with staph aureus. ID consulted. Patient underwent CT head which revealed left acute/subacute cerebellar ischemic infarct.  Neurology consulted.   New events last 24 hours / Subjective: States she does not feel good today. Shakes her head 'no' when asked if she was going to eat her breakfast which is sitting in front of her.  Does not have much of an appetite.  According to notes and per son, she had a really good day yesterday.  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)   Palliative care encounter   Acute ischemic left cerebellar CVA -CT head without contrast revealed acute/subacute nonhemorrhagic stroke involving the left superior cerebellar hemisphere extending into the left cerebellar peduncle  -MRI brain revealed large acute left cerebellar infarct without hemorrhage or mass-effect -Continue aspirin 325mg  daily and lipitor  -Neurology signed off 10/7, follow up in office in 4 weeks   Sepsis secondary to MSSA bacteremia, and endocarditis -Not POA -ID following -TEE 10/7 revealed posterior mitral valve echodensity suspicious of vegetation -Cardiothoracic surgery consulted, nonsurgical candidate  -Repeat blood cultures negative at 5  days -PICC line ordered today  -Antibiotics changed to ceftriaxone in light of worsening kidney  function   AKI -Creatinine jumped to 1.51 10/9.  She does have 1 documented episode of hypotension around 11:30 PM last night, 84/42.  Blood pressure improved after midnight. -FeNa 0.42% indicating pre-renal -Renal US revealed bilateral moderate hydronephrosis. She has required 3x in and out cath. She has history of urinary retention. Insert foley today, strict I/Os.  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 Seen by vascular surgery,unlikely reason for symptoms -Self-reported daily use of NSAID, ibuprofenformonths and 5 pound unintentional weight loss the past 4 to8weeks. Stop NSAIDS.  -Seen by GI but patient declined EGD, GI signed off 10/1. Follow-up with GI outpatient -Continue Protonix 40 mg twice daily x6 weeks then 40 mg daily afterwards as recommended by GI  Intractable lower back pain in the setting of chronic back pain likely multifactorial secondary to severe stenosis at L3-L4 and MSSA bacteremia -MRI thoracic, lumbar, and sacrum done on 11/18/2018 showed severe stenosis at L3-L4 with disc bulge and central disc protrusion at T7-8. -Neurosurgery following   Acute on chronic combined systolic and diastolic CHF/small to moderate pericardial effusion. -Seen by Dr.Hochrein,per review of medical records on 04/10/2014 she had an ejection fraction of 35%,had a left heart cath in September 02, 2013 with history of nonischemic cardiomyopathy -2D echo done on 11/13/2018 showed LVEF 25% with diffuse hypokinesis and septal lateral dyssynchrony consistent with LBBB as well as grade 1 diastolic dysfunction -Seen by cardiology, patient declines aggressive cardiac work-up. Cardiology signed off 10/2 -Follow-up with Cardiology APP in 2 weeks, and Dr. Antoine Poche in 1-2 months  -Carvedilol 12.5 mg twice daily, losartan 100 mg daily,  Lasix 20 mg as needed at discharge -Repeat echocardiogram in 3 to 6 months  Generalized weakness with physical debility likely in the setting of acute illness -PT OT recommended SNF  Essential hypertension complicated by orthostatic hypotension -No diuretics due to orthostatic hypotension -Euvolemic on exam -Normal BNP 65 on 11/12/2018 -Continue coreg. Cozaar on hold due to AKI  Elevated troponin, likely demand ischemia -High-sensitivity troponin peaked at 196 and trended down -Seen by cardiology, no plan for further ischemic work-up; patient declines aggressive cardiac work-up. -Repeated troponin on 11/18/18, 47 and trended down  HLD -Continue lipitor   Chronic euvolemic hyponatremia -Asymptomatic -Appears to have had hyponatremia since 2018 -Stable  Hypothyroidism  -Continue levothyroxine  GERD -Continue Protonix  Acute superficial vein thrombosis involving the left cephalic vein -Supportive care    DVT prophylaxis: SCD Code Status: DNR Family Communication: Discussed with son over the phone today  Disposition Plan: SNF placement pending.  Repeat COVID test ordered.  PICC line ordered.   Consultants:   Vascular surgery  Cardiology  GI  Neurosurgery  Infectious disease  Neurology  Cardiothoracic surgery   Palliative care   Antimicrobials:  Anti-infectives (From admission, onward)   Start     Dose/Rate Route Frequency Ordered Stop   11/22/18 1430  cefTRIAXone (ROCEPHIN) 2 g in sodium chloride 0.9 % 100 mL IVPB     2 g 200 mL/hr over 30 Minutes Intravenous Every 12 hours 11/22/18 1339     11/20/18 1115  vancomycin (VANCOCIN) IVPB 750 mg/150 ml premix  Status:  Discontinued     750 mg 150 mL/hr over 60 Minutes Intravenous Every 24 hours 11/19/18 1057 11/20/18 1010  11/20/18 1100  nafcillin 2 g in sodium chloride 0.9 % 100 mL IVPB  Status:  Discontinued     2 g 200 mL/hr over 30 Minutes Intravenous Every 4 hours 11/20/18 1010 11/22/18 1339    11/19/18 1115  vancomycin (VANCOCIN) 1,750 mg in sodium chloride 0.9 % 500 mL IVPB     1,750 mg 250 mL/hr over 120 Minutes Intravenous  Once 11/19/18 1042 11/19/18 1419       Objective: Vitals:   11/24/18 2358 11/25/18 0351 11/25/18 0500 11/25/18 0836  BP: (!) 150/59 (!) 166/69  (!) (P) 169/83  Pulse: 71 77  (P) 84  Resp: 15 17  (P) 18  Temp: 98.2 F (36.8 C) 97.9 F (36.6 C)  (P) 97.7 F (36.5 C)  TempSrc: Oral Oral  (P) Oral  SpO2: 95% 93%  (P) 96%  Weight:   75.8 kg   Height:        Intake/Output Summary (Last 24 hours) at 11/25/2018 0938 Last data filed at 11/25/2018 0553 Gross per 24 hour  Intake 1739.41 ml  Output 2600 ml  Net -860.59 ml   Filed Weights   11/23/18 0434 11/24/18 0500 11/25/18 0500  Weight: 78.4 kg 76.1 kg 75.8 kg    Examination: General exam: Appears calm but uncomfortable  Respiratory system: Clear to auscultation. Respiratory effort normal. Cardiovascular system: S1 & S2 heard, RRR. No pedal edema. Gastrointestinal system: Abdomen is nondistended, soft and nontender. Normal bowel sounds heard. Central nervous system: Alert and oriented. Non focal exam.  Extremities: Symmetric in appearance bilaterally  Skin: No rashes, lesions or ulcers on exposed skin  Psychiatry: Judgement and insight appear stable. Mood & affect appropriate.    Data Reviewed: I have personally reviewed following labs and imaging studies  EKG 10/11 reviewed independently.  Normal sinus rhythm with left bundle branch block.  CBC: Recent Labs  Lab 11/21/18 0327 11/22/18 0345 11/23/18 0343 11/24/18 0353 11/25/18 0432  WBC 11.0* 12.4* 18.5* 14.0* 17.0*  HGB 9.8* 10.2* 10.6* 9.4* 9.7*  HCT 29.1* 30.1* 29.9* 25.8* 28.2*  MCV 100.7* 100.0 96.5 95.9 96.9  PLT 155 152 128* 118* 469   Basic Metabolic Panel: Recent Labs  Lab 11/21/18 0327 11/22/18 0345 11/23/18 0343 11/24/18 0353 11/25/18 0432  NA 130* 132* 133* 133* 135  K 3.7 3.3* 3.8 2.9* 4.2  CL 96* 99 101  103 108  CO2 21* 20* 18* 19* 19*  GLUCOSE 146* 162* 136* 141* 140*  BUN 26* 40* 56* 42* 29*  CREATININE 0.92 1.51* 2.30* 1.90* 1.54*  CALCIUM 8.2* 7.7* 7.6* 7.6* 7.9*   GFR: Estimated Creatinine Clearance: 26.5 mL/min (A) (by C-G formula based on SCr of 1.54 mg/dL (H)). Liver Function Tests: No results for input(s): AST, ALT, ALKPHOS, BILITOT, PROT, ALBUMIN in the last 168 hours. No results for input(s): LIPASE, AMYLASE in the last 168 hours. No results for input(s): AMMONIA in the last 168 hours. Coagulation Profile: No results for input(s): INR, PROTIME in the last 168 hours. Cardiac Enzymes: No results for input(s): CKTOTAL, CKMB, CKMBINDEX, TROPONINI in the last 168 hours. BNP (last 3 results) No results for input(s): PROBNP in the last 8760 hours. HbA1C: No results for input(s): HGBA1C in the last 72 hours. CBG: No results for input(s): GLUCAP in the last 168 hours. Lipid Profile: No results for input(s): CHOL, HDL, LDLCALC, TRIG, CHOLHDL, LDLDIRECT in the last 72 hours. Thyroid Function Tests: No results for input(s): TSH, T4TOTAL, FREET4, T3FREE, THYROIDAB in the last 72 hours. Anemia  Panel: No results for input(s): VITAMINB12, FOLATE, FERRITIN, TIBC, IRON, RETICCTPCT in the last 72 hours. Sepsis Labs: No results for input(s): PROCALCITON, LATICACIDVEN in the last 168 hours.  Recent Results (from the past 240 hour(s))  Novel Coronavirus, NAA (Hosp order, Send-out to Ref Lab; TAT 18-24 hrs     Status: None   Collection Time: 11/16/18  4:51 PM   Specimen: Nasopharyngeal Swab; Respiratory  Result Value Ref Range Status   SARS-CoV-2, NAA NOT DETECTED NOT DETECTED Final    Comment: (NOTE) This nucleic acid amplification test was developed and its performance characteristics determined by [facility]. Nucleic acid amplification tests include PCR and TMA. This test has not been FDA cleared or approved. This test has been authorized by FDA under an Emergency Use  Authorization (EUA). This test is only authorized for the duration of time the declaration that circumstances exist justifying the authorization of the emergency use of in vitro diagnostic tests for detection of SARS-CoV-2 virus and/or diagnosis of COVID-19 infection under section 564(b)(1) of the Act, 21 U.S.C. 960AVW-0(J360bbb-3(b) (1), unless the authorization is terminated or revoked sooner. When diagnostic testing is negative, the possibility of a false negative result should be considered in the context of a patient's recent exposures and the presence of clinical signs and symptoms consistent with COVID-19. An individual without symptoms of COVID- 19 and who is not shedding SARS-CoV-2 vi rus would expect to have a negative (not detected) result in this assay. Performed At: [facility] [address] [place], [place] [id number] Jolene SchimkeNagendra Sanjai MD NF:[id number]:[phone]    Coronavirus Source NASOPHARYNGEAL  Final    Comment: Performed at [facility], [address] [place], [place] [address]  Culture, blood (routine x 2)     Status: Abnormal   Collection Time: 11/18/18  7:45 AM   Specimen: BLOOD LEFT ARM  Result Value Ref Range Status   Specimen Description BLOOD LEFT ARM  Final   Special Requests   Final    BOTTLES DRAWN AEROBIC ONLY Blood Culture results may not be optimal due to an inadequate volume of blood received in culture bottles   Culture  Setup Time   Final    AEROBIC BOTTLE ONLY GRAM POSITIVE COCCI CRITICAL VALUE NOTED.  VALUE IS CONSISTENT WITH PREVIOUSLY REPORTED AND CALLED VALUE.    Culture (A)  Final    STAPHYLOCOCCUS AUREUS SUSCEPTIBILITIES PERFORMED ON PREVIOUS CULTURE WITHIN THE LAST 5 DAYS. Performed at [facility], [address] [place], [place] [address]    Report Status 11/20/2018 FINAL  Final  Culture, blood (routine x 2)     Status: Abnormal   Collection Time: 11/18/18  7:51 AM   Specimen: BLOOD LEFT HAND  Result Value Ref Range Status    Specimen Description BLOOD LEFT HAND  Final   Special Requests   Final    BOTTLES DRAWN AEROBIC ONLY Blood Culture results may not be optimal due to an inadequate volume of blood received in culture bottles   Culture  Setup Time   Final    AEROBIC BOTTLE ONLY GRAM POSITIVE COCCI CRITICAL VALUE NOTED.  VALUE IS CONSISTENT WITH PREVIOUSLY REPORTED AND CALLED VALUE. Performed at [facility], [address] [place], [place] [address]    Culture STAPHYLOCOCCUS AUREUS (A)  Final   Report Status 11/20/2018 FINAL  Final   Organism ID, Bacteria STAPHYLOCOCCUS AUREUS  Final      Susceptibility   Staphylococcus aureus - MIC*    CIPROFLOXACIN <=  0.5 SENSITIVE Sensitive     ERYTHROMYCIN <=0.25 SENSITIVE Sensitive     GENTAMICIN <=0.5 SENSITIVE Sensitive     OXACILLIN <=0.25 SENSITIVE Sensitive     TETRACYCLINE <=1 SENSITIVE Sensitive     VANCOMYCIN <=0.5 SENSITIVE Sensitive     TRIMETH/SULFA <=10 SENSITIVE Sensitive     CLINDAMYCIN <=0.25 SENSITIVE Sensitive     RIFAMPIN <=0.5 SENSITIVE Sensitive     Inducible Clindamycin NEGATIVE Sensitive     * STAPHYLOCOCCUS AUREUS  MRSA PCR Screening     Status: None   Collection Time: 11/19/18 10:48 AM   Specimen: Nasal Mucosa; Nasopharyngeal  Result Value Ref Range Status   MRSA by PCR NEGATIVE NEGATIVE Final    Comment:        The GeneXpert MRSA Assay (FDA approved for NASAL specimens only), is one component of a comprehensive MRSA colonization surveillance program. It is not intended to diagnose MRSA infection nor to guide or monitor treatment for MRSA infections. Performed at [facility], [address], [place], [place] [address]   Culture, blood (routine x 2)     Status: Abnormal   Collection Time: 11/19/18 11:22 AM   Specimen: BLOOD  Result Value Ref Range Status   Specimen Description BLOOD RIGHT ANTECUBITAL  Final   Special Requests   Final    AEROBIC BOTTLE ONLY Blood Culture results may not be optimal due to an  excessive volume of blood received in culture bottles   Culture  Setup Time   Final    AEROBIC BOTTLE ONLY GRAM POSITIVE COCCI CRITICAL VALUE NOTED.  VALUE IS CONSISTENT WITH PREVIOUSLY REPORTED AND CALLED VALUE.    Culture (A)  Final    STAPHYLOCOCCUS AUREUS SUSCEPTIBILITIES PERFORMED ON PREVIOUS CULTURE WITHIN THE LAST 5 DAYS. Performed at [facility], [address] [place], [place] [address]    Report Status 11/20/2018 FINAL  Final  Culture, blood (routine x 2)     Status: Abnormal   Collection Time: 11/19/18 11:31 AM   Specimen: BLOOD RIGHT HAND  Result Value Ref Range Status   Specimen Description BLOOD RIGHT HAND  Final   Special Requests AEROBIC BOTTLE ONLY Blood Culture adequate volume  Final   Culture  Setup Time   Final    AEROBIC BOTTLE ONLY GRAM POSITIVE COCCI CRITICAL VALUE NOTED.  VALUE IS CONSISTENT WITH PREVIOUSLY REPORTED AND CALLED VALUE.    Culture (A)  Final    STAPHYLOCOCCUS AUREUS SUSCEPTIBILITIES PERFORMED ON PREVIOUS CULTURE WITHIN THE LAST 5 DAYS. Performed at [facility], [address], [place], [place] [address]    Report Status 11/20/2018 FINAL  Final  Culture, blood (Routine X 2) w Reflex to ID Panel     Status: None   Collection Time: 11/20/18  4:09 PM   Specimen: BLOOD LEFT HAND  Result Value Ref Range Status   Specimen Description BLOOD LEFT HAND  Final   Special Requests   Final    BOTTLES DRAWN AEROBIC ONLY Blood Culture adequate volume   Culture   Final    NO GROWTH 5 DAYS Performed at [facility], [address] [place], [place] [address]    Report Status 11/25/2018 FINAL  Final  Culture, blood (Routine X 2) w Reflex to ID Panel     Status: None   Collection Time: 11/20/18  4:09 PM   Specimen: BLOOD LEFT HAND  Result Value Ref Range Status   Specimen Description BLOOD LEFT HAND  Final  Special Requests   Final    BOTTLES DRAWN AEROBIC ONLY Blood Culture adequate volume   Culture   Final    NO GROWTH 5 DAYS  Performed at [facility], [address], [place], [place] [address]    Report Status 11/25/2018 FINAL  Final      Radiology Studies: No results found.    Scheduled Meds: . aspirin EC  325 mg Oral Daily  . atorvastatin  20 mg Oral q1800  . carvedilol  12.5 mg Oral BID  . feeding supplement (ENSURE ENLIVE)  237 mL Oral BID BM  . influenza vaccine adjuvanted  0.5 mL Intramuscular Tomorrow-1000  . levothyroxine  50 mcg Oral QAC breakfast  . lidocaine  1 patch Transdermal Q24H  . LORazepam  0.5 mg Oral QHS  . pantoprazole  40 mg Oral BID  . senna-docusate  2 tablet Oral BID  . sodium chloride flush  3 mL Intravenous Q12H  . vitamin B-12  1,000 mcg Oral Daily   Continuous Infusions: . sodium chloride 125 mL/hr at 11/25/18 0814  . cefTRIAXone (ROCEPHIN)  IV 2 g (11/25/18 0300)  . methocarbamol (ROBAXIN) IV 500 mg (11/17/18 2004)     LOS: 12 days      Time spent: 25 minutes   Noralee Stain, DO [facility] 11/25/2018, 9:38 AM   Available via Epic secure chat 7am-7pm After these hours, please refer to coverage provider listed on [email or web address]

## 2018-11-25 NOTE — Progress Notes (Signed)
Called patient's son Autumn [name] to notify him of patient's positive covid 19 results. Son has been un to visit the patient everyday that she has been hospitalized. Autumn [name] stated that he is not having any symptoms and has not had a fever to the best of his knowledge. Informed Autumn [name] that a second test will be done and we update him when the patient is able to have visitors again. Autumn [name] verbalized understanding and stated he will let his brother Autumn [name] know as well.

## 2018-11-25 NOTE — Progress Notes (Signed)
Discussed with Dr. Baxter Flattery with infectious disease. She believes the positive covid-19 test to be a false positive since there have been three negative tests and patient is not presenting with covid-like symptoms. No isolation indicated, standard precautions appropriate.

## 2018-11-25 NOTE — Progress Notes (Signed)
Subjective: No new complaints. Continues to have back pain.   Antibiotics:  Anti-infectives (From admission, onward)   Start     Dose/Rate Route Frequency Ordered Stop   11/22/18 1430  cefTRIAXone (ROCEPHIN) 2 g in sodium chloride 0.9 % 100 mL IVPB     2 g 200 mL/hr over 30 Minutes Intravenous Every 12 hours 11/22/18 1339     11/20/18 1115  vancomycin (VANCOCIN) IVPB 750 mg/150 ml premix  Status:  Discontinued     750 mg 150 mL/hr over 60 Minutes Intravenous Every 24 hours 11/19/18 1057 11/20/18 1010   11/20/18 1100  nafcillin 2 g in sodium chloride 0.9 % 100 mL IVPB  Status:  Discontinued     2 g 200 mL/hr over 30 Minutes Intravenous Every 4 hours 11/20/18 1010 11/22/18 1339   11/19/18 1115  vancomycin (VANCOCIN) 1,750 mg in sodium chloride 0.9 % 500 mL IVPB     1,750 mg 250 mL/hr over 120 Minutes Intravenous  Once 11/19/18 1042 11/19/18 1419      Medications: Scheduled Meds: . aspirin EC  325 mg Oral Daily  . atorvastatin  20 mg Oral q1800  . carvedilol  12.5 mg Oral BID  . feeding supplement (ENSURE ENLIVE)  237 mL Oral BID BM  . influenza vaccine adjuvanted  0.5 mL Intramuscular Tomorrow-1000  . levothyroxine  50 mcg Oral QAC breakfast  . lidocaine  1 patch Transdermal Q24H  . LORazepam  0.5 mg Oral QHS  . pantoprazole  40 mg Oral BID  . senna-docusate  2 tablet Oral BID  . sodium chloride flush  3 mL Intravenous Q12H  . vitamin B-12  1,000 mcg Oral Daily   Continuous Infusions: . sodium chloride 125 mL/hr at 11/25/18 0814  . cefTRIAXone (ROCEPHIN)  IV 2 g (11/25/18 0300)  . methocarbamol (ROBAXIN) IV 500 mg (11/17/18 2004)   PRN Meds:.acetaminophen **OR** acetaminophen, albuterol, alum & mag hydroxide-simeth, diclofenac sodium, labetalol, methocarbamol (ROBAXIN) IV, naLOXone (NARCAN)  injection, oxyCODONE-acetaminophen, traMADol, traZODone  Objective: Weight change: -0.3 kg  Intake/Output Summary (Last 24 hours) at 11/25/2018 1138 Last data filed at  11/25/2018 0553 Gross per 24 hour  Intake 1739.41 ml  Output 2600 ml  Net -860.59 ml   Blood pressure (!) (P) 169/83, pulse (P) 84, temperature (P) 97.7 F (36.5 C), temperature source (P) Oral, resp. rate (P) 18, height 5\' 3"  (1.6 m), weight 75.8 kg, SpO2 (P) 96 %. Temp:  [97.7 F (36.5 C)-99.1 F (37.3 C)] (P) 97.7 F (36.5 C) (10/12 0836) Pulse Rate:  [71-84] (P) 84 (10/12 0836) Resp:  [15-18] (P) 18 (10/12 0836) BP: (131-166)/(58-69) (P) 169/83 (10/12 0836) SpO2:  [93 %-98 %] (P) 96 % (10/12 0836) Weight:  [75.8 kg] 75.8 kg (10/12 0500)  Physical Exam: General: lethargic, oriented x3, not in any acute distress. HEENT: anicteric sclera, EOMI CVS regular rate, normal  Chest: no wheezing, no respiratory distress Abdomen: soft non-distended   Extremities: no edema or deformity noted bilaterally Skin: no rashes Neuro: nonfocal  CBC: CBC Latest Ref Rng & Units 11/25/2018 11/24/2018 11/23/2018  WBC 4.0 - 10.5 K/uL 17.0(H) 14.0(H) 18.5(H)  Hemoglobin 12.0 - 15.0 g/dL 9.7(L) 9.4(L) 10.6(L)  Hematocrit 36.0 - 46.0 % 28.2(L) 25.8(L) 29.9(L)  Platelets 150 - 400 K/uL 154 118(L) 128(L)   BMET Recent Labs    11/24/18 0353 11/25/18 0432  NA 133* 135  K 2.9* 4.2  CL 103 108  CO2 19* 19*  GLUCOSE 141*  140*  BUN 42* 29*  CREATININE 1.90* 1.54*  CALCIUM 7.6* 7.9*    Liver Panel  No results for input(s): PROT, ALBUMIN, AST, ALT, ALKPHOS, BILITOT, BILIDIR, IBILI in the last 72 hours.   Sedimentation Rate No results for input(s): ESRSEDRATE in the last 72 hours. C-Reactive Protein No results for input(s): CRP in the last 72 hours.  Micro Results: Recent Results (from the past 720 hour(s))  Culture, blood (routine x 2)     Status: Abnormal   Collection Time: 11/12/18  2:10 AM   Specimen: BLOOD  Result Value Ref Range Status   Specimen Description   Final    BLOOD RIGHT ANTECUBITAL Performed at [facility], [address] [place], [place] [address]     Special Requests   Final    BOTTLES DRAWN AEROBIC AND ANAEROBIC Blood Culture adequate volume Performed at [facility], [address] [place], [place] [address]    Culture  Setup Time   Final    GRAM POSITIVE COCCI IN BOTH AEROBIC AND ANAEROBIC BOTTLES CRITICAL RESULT CALLED TO, READ BACK BY AND VERIFIED WITH: PHARMD T BAUMEISTER [phone] MLM    Culture (A)  Final    STAPHYLOCOCCUS SPECIES (COAGULASE NEGATIVE) THE SIGNIFICANCE OF ISOLATING THIS ORGANISM FROM A SINGLE SET OF BLOOD CULTURES WHEN MULTIPLE SETS ARE DRAWN IS UNCERTAIN. PLEASE NOTIFY THE MICROBIOLOGY DEPARTMENT WITHIN ONE WEEK IF SPECIATION AND SENSITIVITIES ARE REQUIRED. Performed at [facility], [address] [place], [place] [address]    Report Status 11/15/2018 FINAL  Final  Blood Culture ID Panel (Reflexed)     Status: Abnormal   Collection Time: 11/12/18  2:10 AM  Result Value Ref Range Status   Enterococcus species NOT DETECTED NOT DETECTED Final   Listeria monocytogenes NOT DETECTED NOT DETECTED Final   Staphylococcus species DETECTED (A) NOT DETECTED Final    Comment: Methicillin (oxacillin) susceptible coagulase negative staphylococcus. Possible blood culture contaminant (unless isolated from more than one blood culture draw or clinical case suggests pathogenicity). No antibiotic treatment is indicated for blood  culture contaminants. CRITICAL RESULT CALLED TO, READ BACK BY AND VERIFIED WITH: PHARMD T BAUMEISTER [phone] MLM    Staphylococcus aureus (BCID) NOT DETECTED NOT DETECTED Final   Methicillin resistance NOT DETECTED NOT DETECTED Final   Streptococcus species NOT DETECTED NOT DETECTED Final   Streptococcus agalactiae NOT DETECTED NOT DETECTED Final   Streptococcus pneumoniae NOT DETECTED NOT DETECTED Final   Streptococcus pyogenes NOT DETECTED NOT DETECTED Final   Acinetobacter baumannii NOT DETECTED NOT DETECTED Final   Enterobacteriaceae species NOT DETECTED NOT DETECTED  Final   Enterobacter cloacae complex NOT DETECTED NOT DETECTED Final   Escherichia coli NOT DETECTED NOT DETECTED Final   Klebsiella oxytoca NOT DETECTED NOT DETECTED Final   Klebsiella pneumoniae NOT DETECTED NOT DETECTED Final   Proteus species NOT DETECTED NOT DETECTED Final   Serratia marcescens NOT DETECTED NOT DETECTED Final   Haemophilus influenzae NOT DETECTED NOT DETECTED Final   Neisseria meningitidis NOT DETECTED NOT DETECTED Final   Pseudomonas aeruginosa NOT DETECTED NOT DETECTED Final   Candida albicans NOT DETECTED NOT DETECTED Final   Candida glabrata NOT DETECTED NOT DETECTED Final   Candida krusei NOT DETECTED NOT DETECTED Final   Candida parapsilosis NOT DETECTED NOT DETECTED Final   Candida tropicalis NOT DETECTED NOT DETECTED Final    Comment: Performed at [facility], [address] [place], [place] [address]  SARS CORONAVIRUS 2 (TAT 6-24  HRS) Nasopharyngeal Nasopharyngeal Swab     Status: None   Collection Time: 11/12/18  5:47 AM   Specimen: Nasopharyngeal Swab  Result Value Ref Range Status   SARS Coronavirus 2 NEGATIVE NEGATIVE Final    Comment: (NOTE) SARS-CoV-2 target nucleic acids are NOT DETECTED. The SARS-CoV-2 RNA is generally detectable in upper and lower respiratory specimens during the acute phase of infection. Negative results do not preclude SARS-CoV-2 infection, do not rule out co-infections with other pathogens, and should not be used as the sole basis for treatment or other patient management decisions. Negative results must be combined with clinical observations, patient history, and epidemiological information. The expected result is Negative. Fact Sheet for Patients: HairSlick.no Fact Sheet for Healthcare Providers: quierodirigir.com This test is not yet approved or cleared by the [place] FDA and  has been authorized for detection and/or diagnosis of SARS-CoV-2 by FDA  under an Emergency Use Authorization (EUA). This EUA will remain  in effect (meaning this test can be used) for the duration of the COVID-19 declaration under Section 56 4(b)(1) of the Act, 21 U.S.C. section 360bbb-3(b)(1), unless the authorization is terminated or revoked sooner. Performed at [facility], [address] [place], [place] [address]   Culture, blood (routine x 2)     Status: None   Collection Time: 11/12/18  7:02 PM   Specimen: BLOOD LEFT HAND  Result Value Ref Range Status   Specimen Description BLOOD LEFT HAND  Final   Special Requests   Final    BOTTLES DRAWN AEROBIC ONLY Blood Culture adequate volume   Culture   Final    NO GROWTH 5 DAYS Performed at [facility], [address] [place], [place] [address]    Report Status 11/17/2018 FINAL  Final  Novel Coronavirus, NAA (Hosp order, Send-out to Ref Lab; TAT 18-24 hrs     Status: None   Collection Time: 11/16/18  4:51 PM   Specimen: Nasopharyngeal Swab; Respiratory  Result Value Ref Range Status   SARS-CoV-2, NAA NOT DETECTED NOT DETECTED Final    Comment: (NOTE) This nucleic acid amplification test was developed and its performance characteristics determined by [employer]. Nucleic acid amplification tests include PCR and TMA. This test has not been FDA cleared or approved. This test has been authorized by FDA under an Emergency Use Authorization (EUA). This test is only authorized for the duration of time the declaration that circumstances exist justifying the authorization of the emergency use of in vitro diagnostic tests for detection of SARS-CoV-2 virus and/or diagnosis of COVID-19 infection under section 564(b)(1) of the Act, 21 U.S.C. 034VQQ-5(Z) (1), unless the authorization is terminated or revoked sooner. When diagnostic testing is negative, the possibility of a false negative result should be considered in the context of a patient's recent exposures and the presence of  clinical signs and symptoms consistent with COVID-19. An individual without symptoms of COVID- 19 and who is not shedding SARS-CoV-2 vi rus would expect to have a negative (not detected) result in this assay. Performed At: [facility] [address] [place], [place] [id number] Jolene Schimke MD PI:[id number]    Coronavirus Source NASOPHARYNGEAL  Final    Comment: Performed at [facility], [address] [place], [place] [address]  Culture, blood (routine x 2)     Status: Abnormal   Collection Time: 11/18/18  7:45 AM   Specimen: BLOOD LEFT ARM  Result Value Ref Range Status   Specimen Description BLOOD LEFT  ARM  Final   Special Requests   Final    BOTTLES DRAWN AEROBIC ONLY Blood Culture results may not be optimal due to an inadequate volume of blood received in culture bottles   Culture  Setup Time   Final    AEROBIC BOTTLE ONLY GRAM POSITIVE COCCI CRITICAL VALUE NOTED.  VALUE IS CONSISTENT WITH PREVIOUSLY REPORTED AND CALLED VALUE.    Culture (A)  Final    STAPHYLOCOCCUS AUREUS SUSCEPTIBILITIES PERFORMED ON PREVIOUS CULTURE WITHIN THE LAST 5 DAYS. Performed at [facility], [address] [place], [place] [address]    Report Status 11/20/2018 FINAL  Final  Culture, blood (routine x 2)     Status: Abnormal   Collection Time: 11/18/18  7:51 AM   Specimen: BLOOD LEFT HAND  Result Value Ref Range Status   Specimen Description BLOOD LEFT HAND  Final   Special Requests   Final    BOTTLES DRAWN AEROBIC ONLY Blood Culture results may not be optimal due to an inadequate volume of blood received in culture bottles   Culture  Setup Time   Final    AEROBIC BOTTLE ONLY GRAM POSITIVE COCCI CRITICAL VALUE NOTED.  VALUE IS CONSISTENT WITH PREVIOUSLY REPORTED AND CALLED VALUE. Performed at [facility], [address], [place], [place] [address]    Culture STAPHYLOCOCCUS AUREUS (A)  Final   Report Status 11/20/2018 FINAL  Final   Organism ID, Bacteria  STAPHYLOCOCCUS AUREUS  Final      Susceptibility   Staphylococcus aureus - MIC*    CIPROFLOXACIN <=0.5 SENSITIVE Sensitive     ERYTHROMYCIN <=0.25 SENSITIVE Sensitive     GENTAMICIN <=0.5 SENSITIVE Sensitive     OXACILLIN <=0.25 SENSITIVE Sensitive     TETRACYCLINE <=1 SENSITIVE Sensitive     VANCOMYCIN <=0.5 SENSITIVE Sensitive     TRIMETH/SULFA <=10 SENSITIVE Sensitive     CLINDAMYCIN <=0.25 SENSITIVE Sensitive     RIFAMPIN <=0.5 SENSITIVE Sensitive     Inducible Clindamycin NEGATIVE Sensitive     * STAPHYLOCOCCUS AUREUS  MRSA PCR Screening     Status: None   Collection Time: 11/19/18 10:48 AM   Specimen: Nasal Mucosa; Nasopharyngeal  Result Value Ref Range Status   MRSA by PCR NEGATIVE NEGATIVE Final    Comment:        The GeneXpert MRSA Assay (FDA approved for NASAL specimens only), is one component of a comprehensive MRSA colonization surveillance program. It is not intended to diagnose MRSA infection nor to guide or monitor treatment for MRSA infections. Performed at [facility], [address], [place], [place] [address]   Culture, blood (routine x 2)     Status: Abnormal   Collection Time: 11/19/18 11:22 AM   Specimen: BLOOD  Result Value Ref Range Status   Specimen Description BLOOD RIGHT ANTECUBITAL  Final   Special Requests   Final    AEROBIC BOTTLE ONLY Blood Culture results may not be optimal due to an excessive volume of blood received in culture bottles   Culture  Setup Time   Final    AEROBIC BOTTLE ONLY GRAM POSITIVE COCCI CRITICAL VALUE NOTED.  VALUE IS CONSISTENT WITH PREVIOUSLY REPORTED AND CALLED VALUE.    Culture (A)  Final    STAPHYLOCOCCUS AUREUS SUSCEPTIBILITIES PERFORMED ON PREVIOUS CULTURE WITHIN THE LAST 5 DAYS. Performed at [facility], [address], [place], [place] [address]    Report Status 11/20/2018 FINAL  Final  Culture, blood (routine x  2)     Status: Abnormal   Collection Time: 11/19/18 11:31 AM   Specimen: BLOOD  RIGHT HAND  Result Value Ref Range Status   Specimen Description BLOOD RIGHT HAND  Final   Special Requests AEROBIC BOTTLE ONLY Blood Culture adequate volume  Final   Culture  Setup Time   Final    AEROBIC BOTTLE ONLY GRAM POSITIVE COCCI CRITICAL VALUE NOTED.  VALUE IS CONSISTENT WITH PREVIOUSLY REPORTED AND CALLED VALUE.    Culture (A)  Final    STAPHYLOCOCCUS AUREUS SUSCEPTIBILITIES PERFORMED ON PREVIOUS CULTURE WITHIN THE LAST 5 DAYS. Performed at [facility], [address], [place], [place] [address]    Report Status 11/20/2018 FINAL  Final  Culture, blood (Routine X 2) w Reflex to ID Panel     Status: None   Collection Time: 11/20/18  4:09 PM   Specimen: BLOOD LEFT HAND  Result Value Ref Range Status   Specimen Description BLOOD LEFT HAND  Final   Special Requests   Final    BOTTLES DRAWN AEROBIC ONLY Blood Culture adequate volume   Culture   Final    NO GROWTH 5 DAYS Performed at [facility], [address], [place], [place] [address]    Report Status 11/25/2018 FINAL  Final  Culture, blood (Routine X 2) w Reflex to ID Panel     Status: None   Collection Time: 11/20/18  4:09 PM   Specimen: BLOOD LEFT HAND  Result Value Ref Range Status   Specimen Description BLOOD LEFT HAND  Final   Special Requests   Final    BOTTLES DRAWN AEROBIC ONLY Blood Culture adequate volume   Culture   Final    NO GROWTH 5 DAYS Performed at [facility], [address], [place], [place] [address]    Report Status 11/25/2018 FINAL  Final  Novel Coronavirus, NAA (Hosp order, Send-out to Ref Lab; TAT 18-24 hrs     Status: Abnormal   Collection Time: 11/24/18  6:29 PM   Specimen: Nasopharyngeal Swab; Respiratory  Result Value Ref Range Status   SARS-CoV-2, NAA DETECTED (A) NOT DETECTED Final    Comment: (NOTE)                  Client Requested Flag This nucleic acid amplification test was developed and its performance characteristics determined by [employer].  Nucleic acid amplification tests include PCR and TMA. This test has not been FDA cleared or approved. This test has been authorized by FDA under an Emergency Use Authorization (EUA). This test is only authorized for the duration of time the declaration that circumstances exist justifying the authorization of the emergency use of in vitro diagnostic tests for detection of SARS-CoV-2 virus and/or diagnosis of COVID-19 infection under section 564(b)(1) of the Act, 21 U.S.C. 914NWG-9(F) (1), unless the authorization is terminated or revoked sooner. When diagnostic testing is negative, the possibility of a false negative result should be considered in the context of a patient's recent exposures and the presence of clinical signs and symptoms consistent with COVID-19. An individual without symptoms of COVID-  19 and who is not shedding SARS-CoV-2 virus would expect to have a negative (not detected) result in this assay. Performed At: [facility] [address] [place], [place] [id number] Jolene Schimke MD QI:[id number]    Coronavirus Source NASOPHARYNGEAL  Final    Comment: Performed at [facility], [address], [place], [place] [address]  Studies/Results: [employer]  Result Date: 11/25/2018 If [employer] image not attached, placement could not be confirmed due to current cardiac rhythm.  Assessment/Plan:  INTERVAL HISTORY: Pt afebrile.  Cr improving 1.54<<1.9<<2.3. Pt switched to ceftriaxone after Cr increase on Nafcillin. Pt seen by palliative over the weekend. Continues to be full scope of treatment, though pt is fragile and prognosis is unable to be determined.  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 TEE significant for small mobile echodensity attached to the posterior mitral valve leaflet. Not a candidate for CT surgery in light of recent stroke and risk for hemorrhagic conversion.   Blood cultures from 10/7 final with no growth in 5 days. PICC to be placed tomorrow and pt discharged to SNF.  6 week treatment course until 11/17.  Plan: 1. Continue ceftriaxone 2g IV q12h 2. PICC placement tomorrow 3. Outpatient ID appointment scheduled before discharge    LOS: 12 days   Thom Chimesllen Omar Gayden 11/25/2018, 11:38 AM

## 2018-11-25 NOTE — Progress Notes (Signed)
Infection Prevention investigating case since positive test after >10 day into hospitalization.  We will repeat hologic testing for today. Unable to get CT value from 10/11 test since it was a send out to [place] calling skilled facility on patient to find out [place]. [place] [facility] [phone]

## 2018-11-25 NOTE — Progress Notes (Signed)
Spoke with Joycelyn Schmid, RN concerning PICC placement. Patient is expected to discharge to SNF tomorrow. Joycelyn Schmid, RN was informed that PICC would probably be placed tomorrow. Patient has two PIVs and sufficient for her needs at this time.

## 2018-11-25 NOTE — Progress Notes (Signed)
Spoke with patient's son, Autumn [name], to get consent for PICC. He was concerned about the positive and then negative results for COVID. [name] spoke with the charge nurse for [facility] and ID has been contacted and will speak to him addressing his concerns. The patient is expected to transfer to a SNF tomorrow, so PICC placement will be held until son is in agreement.

## 2018-11-25 NOTE — Plan of Care (Signed)
Patient continues to report a pain level of 9 out of 10 only 2 hours after receiving pain medication.

## 2018-11-25 NOTE — Plan of Care (Signed)
  Problem: Activity: Goal: Risk for activity intolerance will decrease Outcome: Progressing   Problem: Elimination: Goal: Will not experience complications related to bowel motility Outcome: Progressing   Problem: Clinical Measurements: Goal: Ability to maintain clinical measurements within normal limits will improve Outcome: Progressing

## 2018-11-26 ENCOUNTER — Ambulatory Visit: Payer: Medicare Other | Admitting: Adult Health

## 2018-11-26 LAB — CBC
HCT: 28.1 % — ABNORMAL LOW (ref 36.0–46.0)
Hemoglobin: 9.8 g/dL — ABNORMAL LOW (ref 12.0–15.0)
MCH: 33.3 pg (ref 26.0–34.0)
MCHC: 34.9 g/dL (ref 30.0–36.0)
MCV: 95.6 fL (ref 80.0–100.0)
Platelets: 183 10*3/uL (ref 150–400)
RBC: 2.94 MIL/uL — ABNORMAL LOW (ref 3.87–5.11)
RDW: 14.7 % (ref 11.5–15.5)
WBC: 15.6 10*3/uL — ABNORMAL HIGH (ref 4.0–10.5)
nRBC: 0 % (ref 0.0–0.2)

## 2018-11-26 LAB — BASIC METABOLIC PANEL
Anion gap: 12 (ref 5–15)
BUN: 24 mg/dL — ABNORMAL HIGH (ref 8–23)
CO2: 19 mmol/L — ABNORMAL LOW (ref 22–32)
Calcium: 8.3 mg/dL — ABNORMAL LOW (ref 8.9–10.3)
Chloride: 104 mmol/L (ref 98–111)
Creatinine, Ser: 1.33 mg/dL — ABNORMAL HIGH (ref 0.44–1.00)
GFR calc Af Amer: 42 mL/min — ABNORMAL LOW (ref >60)
GFR calc non Af Amer: 37 mL/min — ABNORMAL LOW (ref >60)
Glucose, Bld: 119 mg/dL — ABNORMAL HIGH (ref 70–99)
Potassium: 3.9 mmol/L (ref 3.5–5.1)
Sodium: 135 mmol/L (ref 135–145)

## 2018-11-26 MED ORDER — CEFTRIAXONE IV (FOR PTA / DISCHARGE USE ONLY): 2.0000 g | Freq: Two times a day (BID) | INTRAVENOUS | 0 refills | Status: DC

## 2018-11-26 NOTE — Progress Notes (Deleted)
Cardiology Office Note   Date:  11/26/2018   ID:  Myrical, Andujo September 29, 1932, MRN [id number]  PCP:  Lajean Manes, MD  Cardiologist:  Dr. Percival Spanish  No chief complaint on file.    History of Present Illness: Autumn [name] is a 85 y.o. female who presents for ongoing assessment and management of nonischemic cardiomyopathy, EF of 55% to 60% in 2016, history of mild MR, hypertension, chronic left bundle branch block with other history to include hypothyroidism.  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  The patient was also found to have bilateral fibromuscular dysplasia of her renal arteries.  The patient was seen in the ER on 11/12/2018 after transfer from independent living center for complaints of nausea and vomiting.  She also had complaints of lightheaded and dizziness while brushing her teeth with near syncopal episode.  She was found to have orthostatic hypotension.  She also had evidence of lactic acidosis. CTA was negative for PE or dissection.  She did have mildly elevated troponin and it was felt to be related to demand ischemia.  Repeat echocardiogram was planned.  Echocardiogram revealed significantly reduced EF of 25% with wall motion abnormalities and apical region but it was difficult to assess in the setting of left bundle branch block.  At that time carvedilol was increased to 25 mg twice daily, and losartan was increased to 100 mg daily.  On second day of admission patient was found to be unresponsive by nursing staff.  She became bradycardic and hypotensive.  It was felt that this was due to a vasovagal event.  MRI on 11/20/2018 revealed a large acute left cerebellar infarct without hemorrhage or mass with moderate chronic small vessel ischemic microangiopathy.  The patient was planned for a TEE as blood cultures were positive for bacteremia. This was completed on 11/20/2018 and revealed  small vegetation on the mitral valve, mobile echodensity attached to the posterior mitral valve leaflet measuring 3.5 x 2 mm., EF of 55 to 60% with normal heart function.  There was concern for septic embolization to the brain.  In the setting of endocarditis, and septic emboli causing CVA consult for infection disease was requested by internal medicine service who is managing her care.  She was treated with IV nafcillin.  She was not found to be a candidate for replacement and the patient refused any invasive cardiac intervention..  Supportive care was recommended.  Neurology also saw her and she was recommended for aspirin therapy.  She was to be transferred to skilled nursing facility for physical therapy.    Past Medical History:  Diagnosis Date   Anxiety    GERD (gastroesophageal reflux disease)    Hypertension    Hypothyroid    LBBB (left bundle branch block)    MRSA bacteremia 11/19/2018   Osteopenia    Peripheral neuropathy    Venous insufficiency     Past Surgical History:  Procedure Laterality Date   ABDOMINAL HYSTERECTOMY     BLADDER SURGERY     LEFT HEART CATHETERIZATION WITH CORONARY ANGIOGRAM N/A 09/02/2013   Procedure: LEFT HEART CATHETERIZATION WITH CORONARY ANGIOGRAM;  Surgeon: Minus Breeding, MD;  Location: [facility] CATH LAB;  Service: Cardiovascular;  Laterality: N/A;   RENAL ANGIOGRAM N/A 09/02/2013   Procedure: RENAL ANGIOGRAM;  Surgeon: Minus Breeding, MD;  Location: [facility] CATH LAB;  Service: Cardiovascular;  Laterality: N/A;   TEE WITHOUT CARDIOVERSION N/A 11/20/2018  Procedure: TRANSESOPHAGEAL ECHOCARDIOGRAM (TEE);  Surgeon: Lars Masson, MD;  Location: [facility] ENDOSCOPY;  Service: Cardiovascular;  Laterality: N/A;     No current facility-administered medications for this visit.    Current Outpatient Medications  Medication Sig Dispense Refill   aspirin 325 MG tablet Take 1 tablet (325 mg total) by mouth daily. 30 tablet 0   carvedilol (COREG) 12.5 MG  tablet Take 1 tablet (12.5 mg total) by mouth 2 (two) times daily. 60 tablet 0   LORazepam (ATIVAN) 0.5 MG tablet Take 1 tablet (0.5 mg total) by mouth at bedtime as needed for sleep. 3 tablet 0   losartan (COZAAR) 100 MG tablet Take 1 tablet (100 mg total) by mouth daily. 30 tablet 0   pantoprazole (PROTONIX) 40 MG tablet Take 1 tablet (40 mg total) by mouth 2 (two) times daily. Take 1 tablet 40 mg BID x 6 weeks then 1 tab 40 mg daily afterwards 90 tablet 0   senna-docusate (SENOKOT-S) 8.6-50 MG tablet Take 2 tablets by mouth 2 (two) times daily. 14 tablet 0   traMADol (ULTRAM) 50 MG tablet Take 1 tablet (50 mg total) by mouth daily as needed for severe pain. 4 tablet 0   Facility-Administered Medications Ordered in Other Visits  Medication Dose Route Frequency Provider Last Rate Last Dose   0.9 %  sodium chloride infusion   Intravenous Continuous Noralee Stain, DO 125 mL/hr at 11/26/18 0600     acetaminophen (TYLENOL) tablet 650 mg  650 mg Oral Q6H PRN Lars Masson, MD   650 mg at 11/24/18 2207   Or   acetaminophen (TYLENOL) suppository 650 mg  650 mg Rectal Q6H PRN Lars Masson, MD       albuterol (PROVENTIL) (2.5 MG/3ML) 0.083% nebulizer solution 2.5 mg  2.5 mg Nebulization Q6H PRN Lars Masson, MD       alum & mag hydroxide-simeth (MAALOX/MYLANTA) 200-200-20 MG/5ML suspension 30 mL  30 mL Oral Q6H PRN Lars Masson, MD   30 mL at 11/25/18 6144   aspirin EC tablet 325 mg  325 mg Oral Daily Lars Masson, MD   325 mg at 11/25/18 3154   atorvastatin (LIPITOR) tablet 20 mg  20 mg Oral q1800 Lars Masson, MD   20 mg at 11/25/18 1752   carvedilol (COREG) tablet 12.5 mg  12.5 mg Oral BID Lars Masson, MD   12.5 mg at 11/25/18 2119   cefTRIAXone (ROCEPHIN) 2 g in sodium chloride 0.9 % 100 mL IVPB  2 g Intravenous Q12H Daiva Eves, Lisette Grinder, MD   Stopped at 11/26/18 0086   diclofenac sodium (VOLTAREN) 1 % transdermal gel 4 g  4 g Topical QID PRN  Lars Masson, MD       feeding supplement (ENSURE ENLIVE) (ENSURE ENLIVE) liquid 237 mL  237 mL Oral BID BM Lars Masson, MD   237 mL at 11/25/18 1436   influenza vaccine adjuvanted (FLUAD) injection 0.5 mL  0.5 mL Intramuscular Tomorrow-1000 Hall, Carole N, DO       labetalol (NORMODYNE) injection 5 mg  5 mg Intravenous Q4H PRN Lars Masson, MD       levothyroxine (SYNTHROID) tablet 50 mcg  50 mcg Oral QAC breakfast Lars Masson, MD   50 mcg at 11/26/18 0604   lidocaine (LIDODERM) 5 % 1 patch  1 patch Transdermal Q24H Lars Masson, MD   1 patch at 11/24/18 1118   LORazepam (ATIVAN) tablet 0.5 mg  0.5 mg Oral QHS Dellinger, Marianne L, PA-C   0.5 mg at 11/25/18 2119   methocarbamol (ROBAXIN) 500 mg in dextrose 5 % 50 mL IVPB  500 mg Intravenous Q8H PRN Lars MassonNelson, Katarina H, MD 100 mL/hr at 11/17/18 2004 500 mg at 11/17/18 2004   naloxone [facility](NARCAN) injection 0.4 mg  0.4 mg Intravenous PRN Lars MassonNelson, Katarina H, MD       oxyCODONE-acetaminophen (PERCOCET/ROXICET) 5-325 MG per tablet 1 tablet  1 tablet Oral Q6H PRN Dellinger, Tora KindredMarianne L, PA-C   1 tablet at 11/25/18 0814   pantoprazole (PROTONIX) EC tablet 40 mg  40 mg Oral BID Lars MassonNelson, Katarina H, MD   40 mg at 11/25/18 2119   senna-docusate (Senokot-S) tablet 2 tablet  2 tablet Oral BID Lars MassonNelson, Katarina H, MD   2 tablet at 11/25/18 2119   sodium chloride flush (NS) 0.9 % injection 3 mL  3 mL Intravenous Q12H Lars MassonNelson, Katarina H, MD   3 mL at 11/25/18 2302   traMADol (ULTRAM) tablet 50 mg  50 mg Oral Q6H PRN Dellinger, Marianne L, PA-C   50 mg at 11/25/18 0456   traZODone (DESYREL) tablet 100 mg  100 mg Oral QHS PRN Dellinger, Tora KindredMarianne L, PA-C   100 mg at 11/24/18 0132   vitamin B-12 (CYANOCOBALAMIN) tablet 1,000 mcg  1,000 mcg Oral Daily Lars MassonNelson, Katarina H, MD   1,000 mcg at 11/25/18 [id number]    Allergies:   Chlorhexidine, Hydrochlorothiazide, Ramipril, Remeron [mirtazapine], and Sulfa antibiotics    Social  History:  The patient  reports that she has never smoked. She has never used smokeless tobacco. She reports current alcohol use. She reports that she does not use drugs.   Family History:  The patient's family history includes Hypertension in her mother; Prostate cancer in her father.    ROS: All other systems are reviewed and negative. Unless otherwise mentioned in H&P    PHYSICAL EXAM: VS:  There were no vitals taken for this visit. , BMI There is no height or weight on file to calculate BMI. GEN: Well nourished, well developed, in no acute distress HEENT: normal Neck: no JVD, carotid bruits, or masses Cardiac: ***RRR; no murmurs, rubs, or gallops,no edema  Respiratory:  Clear to auscultation bilaterally, normal work of breathing GI: soft, nontender, nondistended, + BS MS: no deformity or atrophy Skin: warm and dry, no rash Neuro:  Strength and sensation are intact Psych: euthymic mood, full affect   EKG:  EKG {ACTION; IS/IS RUE:[id number]}OT:[id number]} ordered today. The ekg ordered today demonstrates ***   Recent Labs: 11/12/2018: B Natriuretic Peptide 65.8; TSH 0.778 11/18/2018: ALT 23 11/26/2018: BUN 24; Creatinine, Ser 1.33; Hemoglobin 9.8; Platelets 183; Potassium 3.9; Sodium 135    Lipid Panel    Component Value Date/Time   CHOL 178 11/19/2018 1609   TRIG 114 11/19/2018 1609   HDL 66 11/19/2018 1609   CHOLHDL 2.7 11/19/2018 1609   VLDL 23 11/19/2018 1609   LDLCALC 89 11/19/2018 1609      Wt Readings from Last 3 Encounters:  11/26/18 167 lb 1.7 oz (75.8 kg)  10/02/18 150 lb (68 kg)  01/30/17 154 lb (69.9 kg)      Other studies Reviewed: TEE 11/20/2018 1. Left ventricular ejection fraction, by visual estimation, is 55 to 60%. The left ventricle has normal function. Normal left ventricular size. There is no left ventricular hypertrophy. 2. Global right ventricle has normal systolic function.The right ventricular size is normal. No increase in right ventricular wall  thickness.  3. Left atrial size was normal. 4. Right atrial size was normal. 5. Moderate thickening of the mitral valve leaflet(s). 6. Small vegetation on the mitral valve. 7. The mitral valve is normal in structure. Mild mitral valve regurgitation. No evidence of mitral stenosis. 8. The tricuspid valve is normal in structure. Tricuspid valve regurgitation moderate. 9. The aortic valve is normal in structure. Aortic valve regurgitation is trivial by color flow Doppler. Mild to moderate aortic valve sclerosis/calcification without any evidence of aortic stenosis. 10. The pulmonic valve was normal in structure. Pulmonic valve regurgitation is not visualized by color flow Doppler. 11. Normal pulmonary artery systolic pressure. 12. The inferior vena cava is normal in size with greater than 50% respiratory variability, suggesting right atrial pressure of 3 mmHg. 13. There is a very small mobile echodensity attached to the posterior mitral valve leaflet measuring 3.5 x 2 mm. While this might represent a degenerative process of the leaflet, in the settings of bacteremia it is highly suspicious of a vegetation.  Conservative therapy with antibiotics is recommended.   ASSESSMENT AND PLAN:  1.  ***   Current medicines are reviewed at length with the patient today.    Labs/ tests ordered today include: *** Bettey Mare. Liborio Nixon, ANP, AACC   11/26/2018 7:06 AM    [facility] [address] Office [phone] Fax [phone]  Notice: This dictation was prepared with Dragon dictation along with smaller phrase technology. Any transcriptional errors that result from this process are unintentional and may not be corrected upon review.

## 2018-11-26 NOTE — Progress Notes (Signed)
Spoke to primary [facility]  Insertion will be held until son agrees.

## 2018-11-26 NOTE — Progress Notes (Signed)
Physical Therapy Treatment Patient Details Name: Autumn [name] MRN: [id number] DOB: 10-13-1932 Today's Date: 11/26/2018    History of Present Illness 85 y.o. female with past medical history significant for HTN, LBBB, and GERD who presents to the ED via EMS from [facility] center for acute onset neck and back pain as well as vomiting. In ED became orthostatic with 3 minutes of standing and found to have elevated troponing levels. Admitted for observation 9/29.    PT Comments    Pt supine in bed and more alert and in less pain on arrival.  She continues to grimmace and guard with movement but nothing like she has been.  She was able to move from bed to recliner with mod to max assistance.  She continues to benefit from skilled rehab at SNF to improve strength and function before returning home.     Follow Up Recommendations  SNF;Supervision for mobility/OOB     Equipment Recommendations  Rolling walker with 5" wheels    Recommendations for Other Services       Precautions / Restrictions Precautions Precautions: Fall Precaution Comments: severe back pain Restrictions Weight Bearing Restrictions: No    Mobility  Bed Mobility Overal bed mobility: Needs Assistance Bed Mobility: Rolling Rolling: Mod assist Sidelying to sit: Max assist       General bed mobility comments: Pt required cues for rolling to R side of bed.  Pt assisted L knee into hooklying.  Cues to push and reach for railing.  To come to sitting patient required assistance to move LEs to edge of bed and to elevate trunk into sitting.  Pt with noted grimmacing and guarding and but pain settles as she sits there.  Transfers Overall transfer level: Needs assistance Equipment used: Rolling walker (2 wheeled) Transfers: Sit to/from [employer] Sit to Stand: Mod assist   Squat pivot transfers: Max assist     General transfer comment: moderate assistance from elevated bed height to come  to standing.  Pt required cues for foot/hand placement and forward weight shifting.  Able to stand x2 min but remains unable to advance steps.  Returned to seated position for squat pivot from bed to recliner.  Ambulation/Gait Ambulation/Gait assistance: (unable to shift weight to advance steps.)               Stairs             Wheelchair Mobility    Modified Rankin (Stroke Patients Only)       Balance Overall balance assessment: Needs assistance Sitting-balance support: Feet supported;No upper extremity supported Sitting balance-Leahy Scale: Fair     Standing balance support: Bilateral upper extremity supported Standing balance-Leahy Scale: Poor Standing balance comment: dependent on physical assist                            Cognition Arousal/Alertness: Awake/alert Behavior During Therapy: Flat affect Overall Cognitive Status: Within Functional Limits for tasks assessed                                 General Comments: Pt appears more alert and following commands well today.      Exercises      General Comments        Pertinent Vitals/Pain Pain Assessment: 0-10 Pain Score: 8  Pain Location: low back Pain Descriptors / Indicators: Pressure;Constant;Grimacing Pain Intervention(s): Monitored during  session;Repositioned    Home Living                      Prior Function            PT Goals (current goals can now be found in the care plan section) Acute Rehab PT Goals Patient Stated Goal: To try to move Potential to Achieve Goals: Good Progress towards PT goals: Progressing toward goals    Frequency    Min 3X/week      PT Plan Current plan remains appropriate    Co-evaluation              AM-PAC PT "6 Clicks" Mobility   Outcome Measure  Help needed turning from your back to your side while in a flat bed without using bedrails?: Total Help needed moving from lying on your back to sitting on  the side of a flat bed without using bedrails?: Total Help needed moving to and from a bed to a chair (including a wheelchair)?: Total Help needed standing up from a chair using your arms (e.g., wheelchair or bedside chair)?: Total Help needed to walk in hospital room?: Total Help needed climbing 3-5 steps with a railing? : Total 6 Click Score: 6    End of Session Equipment Utilized During Treatment: Gait belt Activity Tolerance: Patient limited by pain Patient left: in bed;with call bell/phone within reach;with bed alarm set;Other (comment) Nurse Communication: Mobility status PT Visit Diagnosis: Muscle weakness (generalized) (M62.81);Difficulty in walking, not elsewhere classified (R26.2);Other abnormalities of gait and mobility (R26.89)     Time: 4944-9675 PT Time Calculation (min) (ACUTE ONLY): 29 min  Charges:  $Therapeutic Activity: 23-37 mins                     Joycelyn Rua, PTA Acute Rehabilitation Services Pager [phone] Office [phone]     Autumn [name] 11/26/2018, 10:59 AM

## 2018-11-26 NOTE — Progress Notes (Signed)
PHARMACY CONSULT NOTE FOR:  OUTPATIENT  PARENTERAL ANTIBIOTIC THERAPY (OPAT)  Indication: Mitral valve endocarditis with septic embolization Regimen: Ceftriaxone 2 gm IV Q 12 hours End date: 12/31/2018  IV antibiotic discharge orders are pended. To discharging provider:  please sign these orders via discharge navigator,  Select New Orders & click on the button choice - Manage This Unsigned Work.     Thank you for allowing pharmacy to be a part of this patient's care.  Jimmy Footman, PharmD, BCPS, [place] [facility] Clinical Pharmacist Phone: [phone] 11/26/2018, 8:03 AM

## 2018-11-26 NOTE — TOC Progression Note (Signed)
Transition of Care [facility]) - Progression Note    Patient Details  Name: Autumn [name] MRN: [id number] Date of Birth: 08/06/1932  Transition of Care [facility] CM/SW [place], [place] Phone Number: 11/26/2018, 1:36 PM  Clinical Narrative:   CSW alerted by RN that patient's second COVID test yesterday came back negative. CSW reached out to [facility] this morning to ask about patient's admission. If patient has a second negative test, then they are able to take the patient. CSW alerted MD and COVID test was ordered. CSW faxed request to [facility] for [place]. CSW spoke with patient's son, [name], with update. [name] agreeable to admit to [facility], glad that they will still take her. [name] discussed his concern with placing the PICC line before the second COVID test comes back negative, wants to wait to ensure that the patient does not have COVID. CSW to follow.    Expected Discharge Plan: [place] Barriers to Discharge: Continued Medical Work up, Other (comment), Insurance Authorization(Second negative COVID test)  Expected Discharge Plan and Services Expected Discharge Plan: [place] In-house Referral: NA Discharge Planning Services: NA Post Acute Care Choice: [place] Living arrangements for the past 2 months: Single Family Home Expected Discharge Date: 11/15/18                                     Social Determinants of Health (SDOH) Interventions    Readmission Risk Interventions No flowsheet data found.

## 2018-11-26 NOTE — Progress Notes (Addendum)
      INFECTIOUS DISEASE ATTENDING ADDENDUM:   Date: 11/26/2018  Patient name: Autumn [name]  Medical record number: [id number]  Date of birth: [birth year]     Patient comfortable resting. Asked me to open her applesauce container  She is in NAD and oriented HEENT: [place] AT EOMI CV: RR Pulm; no wheezes or resp distress GI: nondistended Skin no rashes Neuro nonfocal  Labs and radiographs reviewed.   Her in house [place] 19 test was negative  Case reviewed with Dr. Baxter Flattery and we believe that [facility] test was false + likely with low cycle number though they will not release this  I have DC airborne/contact (they were active in computer but not in real life on her door etc)  I do not know what SNF policy is but we DO NOT believe she has COVID  MSSAB and endcocarditis: plan as per yesterday's note  COVID test: likely false + from [facility]. Son can resume visits but MUST WEAR MASK as per hospital policy on all visitors.   Autumn [name] has an appointment on November 4th with Dr. Tommy Medal at [facility] for Infectious Disease is located in [facility] at  [address] in [place].  [address], which is located to the left of the elevators.  Phone: [phone]  Fax: [phone]  [email or web address]  She should arrive at least 15 minutes prior to her appt.  Alcide Evener 11/26/2018, 8:29 AM  Addendum:  I had an extensive discussion with Autumn [name] the son.  He is going to arrive in the hospital the next 20 to 40 minutes.  I explained to him that the purpose for the PICC line was to give her long-term IV antibiotics whether that would be in a nursing facility or in the hospital if no nursing facility would take her but that if we continue to pursue aggressive treatment 6-week course would be indicated and a PICC line will be needed I would also make things simpler for her in terms of blood draws and  administration of antibiotics.  Plan is as above I will sign off for now please call with further questions.

## 2018-11-26 NOTE — Discharge Summary (Signed)
Physician Discharge Summary  Autumn [name] FYB:[id number] DOB: Feb 21, 1932 DOA: 11/12/2018  PCP: Lajean Manes, MD  Admit date: 11/12/2018 Discharge date: 11/26/2018  Admitted From: Home Disposition:  SNF  Discharge Condition:Stable CODE STATUS: DNR Diet recommendation: Heart Healthy  Brief/Interim Summary:  Autumn [name] a 85 y.o.femalewith medical history significant ofhypertension, hypothyroidism,LBBB,arthritis,andGERD;who initially  presented with complaints of nausea and vomiting.Hospital course complicated by elevated troponin, abnormal 2D echo with LVEF 25%, small to moderate pericardial effusion. Seen by cardiology and patient declined aggressive cardiac work-up per cardiology. Blood cultures also showed MSSA . ID consulted.  TEE showed posterior mitral valve echodensity suspicious for vegetation.  Started on ceftriaxone.  Patient underwent CT head which revealed left acute/subacute cerebellar ischemic infarct.  Neurology consulted and was following.  Hospital course also remarkable for false positive Covid test.  Covid test repeated on the same day resulted negative.  ID recommended to continue ceftriaxone through 12/31/18.  She has been seen by PT and OT and recommended skilled nursing facility on discharge.   Following problems were addressed during her  Hospitalization:  Acute ischemic left cerebellar CVA -CT head without contrast revealed acute/subacute nonhemorrhagic stroke involving the left superior cerebellar hemisphere extending into the left cerebellar peduncle  -MRI brain revealed large acute left cerebellar infarct without hemorrhage or mass-effect -Continue aspirin 33m daily and lipitor  -Neurology signed off 10/7, follow up in office in 4 weeks   Sepsis secondary to MSSA bacteremia, and endocarditis -Not POA -ID following -TEE 10/7 revealed posterior mitral valve echodensity suspicious of vegetation -Cardiothoracic surgery consulted, nonsurgical  candidate  -Repeat blood cultures negative at 5 days -PICC line ordered today  -Antibiotics changed to ceftriaxone in light of worsening kidney function   AKI -Creatinine jumped to 1.51 10/9.  She does have 1 documented episode of hypotension around 11:30 PM last night, 84/42.  Blood pressure improved after midnight. -FeNa 0.42% indicating pre-renal -Renal UKorearevealed bilateral moderate hydronephrosis. She has required 3x in and out cath. She has history of urinary retention. Insert foley today, strict I/Os.  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 Seen by vascular surgery,unlikely reason for symptoms -Self-reported daily use of NSAID, ibuprofenformonths and 5 pound unintentional weight loss the past 4 to8weeks. Stop NSAIDS.  -Seen by GI but patient declined EGD, GI signed off 10/1. Follow-up with GI outpatient -Continue Protonix 40 mg twice daily x6 weeks then 40 mg daily afterwards as recommended by GI  Intractable lower back pain in the setting of chronic back painlikely multifactorial secondary to severe stenosis at L3-L4 and MSSAbacteremia -MRI thoracic, lumbar, and sacrum done on 11/18/2018 showed severe stenosis at L3-L4 with disc bulge and central disc protrusion at T7-8. -Neurosurgery following   Acute on chronic combined systolic and diastolic CHF/small to moderate pericardial effusion. -Seen by Dr.Hochrein,per review of medical records on 04/10/2014 she had an ejection fraction of 35%,had a left heart cath in September 02, 2013 with history of nonischemic cardiomyopathy -2D echo done on 11/13/2018 showed LVEF 25% with diffuse hypokinesis and septal lateral dyssynchrony consistent with LBBB as well as grade 1 diastolic dysfunction -Seen by cardiology, patient declines aggressive cardiac work-up. Cardiology signed off  10/2 -Follow-up with Cardiology APP in 2 weeks, and Dr. HPercival Spanishin 1-2 months  -Carvedilol 12.5 mg twice daily, losartan100 mg daily, Lasix 20 mg as needed at discharge -Repeat echocardiogram in 3 to 6 months  Generalized weakness with physical debility likely in the setting of acute illness -PT OT recommended SNF  Essential hypertension complicated by orthostatic hypotension -No diuretics due to orthostatic hypotension -Euvolemic on exam -Normal BNP 65 on 11/12/2018 -Continue coreg and cozar on DC  Elevated troponin, likely demand ischemia -High-sensitivity troponin peaked at 196 and trended down -Seen by cardiology, no plan for further ischemic work-up; patient declines aggressive cardiac work-up. -Repeated troponin on 11/18/18, 47 and trended down  HLD -Continue lipitor   Chronic euvolemic hyponatremia -Asymptomatic -Appears to have had hyponatremia since 2018 -Stable  Hypothyroidism  -Continue levothyroxine  GERD -Continue Protonix  Acute superficial vein thrombosis involving the left cephalic vein -Supportive care      Discharge Diagnoses:  Principal Problem:   Cerebral embolism with cerebral infarction Active Problems:   Nausea and vomiting   Bilateral hydronephrosis   Elevated troponin   Lactic acidosis   Chronic neck and back pain   Essential hypertension   Tachypnea   Postural dizziness with near syncope   Pain of upper abdomen   MSSA bacteremia   Septic embolism (HCC)   Endocarditis of mitral valve   AKI (acute kidney injury) ([place])   MRSA bacteremia   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 Thanks.   Diet - low sodium heart healthy   Complete by: As directed    Discharge instructions   Complete by: As directed    1)Pl;ease take prescribed medications as instructed. 2)Follow up with ID on Nov 4.Appointment has already been scheduled. 3)Follow up with cardiology as an outpatient.  Name of the provider has been attached,appointment date and time has been attached. 4)Check CBC and BMP in a week. 5) follow-up with [facility] neurology as an outpatient in 4 weeks.   Home infusion instructions [facility] May follow [place] Dosing Protocol; May administer Cathflo as needed to maintain patency of vascular access device.; Flushing of vascular access device: per [facility] Protocol: 0.9% NaCl pre/post medica...   Complete by: As directed    Instructions: May follow [place] Dosing Protocol   Instructions: May administer Cathflo as needed to maintain patency of vascular access device.   Instructions: Flushing of vascular access device: per [facility] Protocol: 0.9% NaCl pre/post medication administration and prn patency; Heparin 100 u/ml, 18m for implanted ports and Heparin 10u/ml, 535mfor all other central venous catheters.   Instructions: May follow [facility] Anaphylaxis Protocol for First Dose Administration in the home: 0.9% NaCl at 25-50 ml/hr to maintain IV access for protocol meds. Epinephrine 0.3 ml IV/IM PRN and Benadryl 25-50 IV/IM PRN s/s of anaphylaxis.   Instructions: [facility]infusion Coordinator (RN) to assist per patient IV care needs in the home PRN.   Increase activity slowly   Complete by: As directed      Allergies as of 11/26/2018      Reactions   Chlorhexidine    Hydrochlorothiazide Other (See Comments)   REACTION: hyponatremia   Ramipril Other (See Comments)   REACTION: GI upset   Remeron [mirtazapine] Other (See Comments)   REACTION:  weakness   Sulfa  Antibiotics Rash      Medication List    STOP taking these medications   HYDROcodone-acetaminophen 5-325 MG tablet Commonly known as: NORCO/VICODIN   LORazepam 0.5 MG tablet Commonly known as: ATIVAN   omeprazole 20 MG capsule Commonly known as: PRILOSEC     TAKE these medications   aspirin 325 MG tablet Take 1 tablet (325 mg total) by mouth daily.   carvedilol 12.5 MG tablet Commonly known as: COREG Take 1 tablet (12.5 mg total) by mouth 2 (two) times daily. What changed:   medication strength  how much to take   cefTRIAXone  IVPB Commonly known as: ROCEPHIN Inject 2 g into the vein every 12 (twelve) hours. Indication: Mitral valve endocarditis with septic embolization Last Day of Therapy:  12/31/2018 Labs - Once weekly:  CBC/D and BMP, Labs - Every other week:  ESR and CRP   diclofenac sodium 1 % Gel Commonly known as: Voltaren Apply 4 g topically 4 (four) times daily. What changed:   when to take this  reasons to take this   estradiol 1 MG tablet Commonly known as: ESTRACE Take 1 mg by mouth daily.   HAIR SKIN AND NAILS FORMULA PO Take 1 tablet by mouth daily.   levothyroxine 50 MCG tablet Commonly known as: SYNTHROID Take 50 mcg by mouth daily before breakfast.   losartan 100 MG tablet Commonly known as: COZAAR Take 1 tablet (100 mg total) by mouth daily. What changed:   medication strength  how much to take   MELATONIN PO Take 1 mL by mouth at bedtime.   pantoprazole 40 MG tablet Commonly known as: PROTONIX Take 1 tablet (40 mg total) by mouth 2 (two) times daily. Take 1 tablet 40 mg BID x 6 weeks then 1 tab 40 mg daily afterwards   senna-docusate 8.6-50 MG tablet Commonly known as: Senokot-S Take 2 tablets by mouth 2 (two) times daily.   traMADol 50 MG tablet Commonly known as: ULTRAM Take 1 tablet (50 mg total) by mouth daily as needed for severe pain.   TURMERIC CURCUMIN PO Take 1 tablet by mouth daily.   VITAMIN B-12 PO Take  1 tablet by mouth daily.   VITAMIN C PO Take 1 tablet by mouth daily.   VITAMIN D PO Take 1 tablet by mouth daily.     ASK your doctor about these medications   lidocaine 5 % Commonly known as: LIDODERM Place 1 patch onto the skin daily for 3 days. Remove & Discard patch within 12 hours or as directed by MD Ask about: Should I take this medication?            Home Infusion Instuctions  (From admission, onward)         Start     Ordered   11/26/18 0000  Home infusion instructions [facility] May follow [place] Dosing Protocol; May administer Cathflo as needed to maintain patency of vascular access device.; Flushing of vascular access device: per [facility] Protocol: 0.9% NaCl pre/post medica...    Question Answer Comment  Instructions May follow [place] Dosing Protocol   Instructions May administer Cathflo as needed to maintain patency of vascular access device.   Instructions Flushing of vascular access device: per [facility] Protocol: 0.9% NaCl pre/post medication administration and prn patency; Heparin 100 u/ml, 67m for implanted ports and Heparin 10u/ml, 576mfor all other central venous catheters.   Instructions May follow [facility] Anaphylaxis Protocol for First Dose Administration  in the home: 0.9% NaCl at 25-50 ml/hr to maintain IV access for protocol meds. Epinephrine 0.3 ml IV/IM PRN and Benadryl 25-50 IV/IM PRN s/s of anaphylaxis.   Instructions [facility] Infusion Coordinator (RN) to assist per patient IV care needs in the home PRN.      11/26/18 0806           Durable Medical Equipment  (From admission, onward)         Start     Ordered   11/15/18 0600  For home use only DME Walker rolling  Once    Comments: 5" wheels  Question:  Patient needs a walker to treat with the following condition  Answer:  Ambulatory dysfunction   11/15/18 0601         Follow-up Information    Lendon Colonel, NP Follow up on 11/26/2018.   Specialties: Nurse  Practitioner, Radiology, Cardiology Why: Please arrive 15 minutes early for your 9:45am post-hospital cardiology follow-up appointment Contact information: [address] [place] [address] [phone]        Minus Breeding, MD Follow up on 01/31/2019.   Specialty: Cardiology Why: Please arrive 15 minutes early for your 10am appointment Contact information: [address] [place] [address] [phone]        [facility]. Schedule an appointment as soon as possible for a visit in 4 week(s).   Specialty: Neurology Contact information: [place] [phone]         Allergies  Allergen Reactions  . Chlorhexidine   . Hydrochlorothiazide Other (See Comments)    REACTION: hyponatremia  . Ramipril Other (See Comments)    REACTION: GI upset  . Remeron [Mirtazapine] Other (See Comments)    REACTION: weakness  . Sulfa Antibiotics Rash    Consultations: ID, neurology, cardiology  Procedures/Studies: Ct Abdomen Pelvis Wo Contrast  Result Date: 11/17/2018 CLINICAL DATA:  85 year old female with history of chest and back pain. Suspected acute aortic dissection. EXAM: CT CHEST, ABDOMEN AND PELVIS WITHOUT CONTRAST TECHNIQUE: Multidetector CT imaging of the chest, abdomen and pelvis was performed following the standard protocol without IV contrast. COMPARISON:  Chest CTA 01/31/2017. CT the abdomen and pelvis 09/09/2008. FINDINGS: CT CHEST FINDINGS Cardiovascular: Heart size is normal. There is no significant pericardial fluid, thickening or pericardial calcification. There is aortic atherosclerosis, as well as atherosclerosis of the great vessels of the mediastinum and the coronary arteries, including calcified atherosclerotic plaque in the right coronary artery. No crescentic high attenuation associated with the wall of the thoracic aorta to suggest the presence of acute intramural  hemorrhage. Accurate assessment for aortic dissection is not possible on today's noncontrast examination. Mediastinum/Nodes: No pathologically enlarged mediastinal or hilar lymph nodes. Please note that accurate exclusion of hilar adenopathy is limited on noncontrast CT scans. Esophagus is unremarkable in appearance. No axillary lymphadenopathy. Lungs/Pleura: No acute consolidative airspace disease. No pleural effusions. Multiple linear areas of architectural distortion throughout the lungs bilaterally, favored to reflect areas of chronic post infectious or inflammatory scarring. No suspicious appearing pulmonary nodules or masses are noted. Musculoskeletal: There are no aggressive appearing lytic or blastic lesions noted in the visualized portions of the skeleton. CT ABDOMEN PELVIS FINDINGS Hepatobiliary: No definite suspicious cystic or solid hepatic lesions are confidently identified on today's noncontrast CT examination. Unenhanced appearance of the gallbladder is normal. Pancreas: No definite pancreatic mass or peripancreatic fluid collections or inflammatory changes are noted on today's noncontrast  examination. Spleen: Unremarkable. Adrenals/Urinary Tract: 2.6 cm exophytic low-attenuation lesion in upper pole of the left kidney, incompletely characterized on today's non-contrast CT examination, but statistically likely to represent a cyst. Unenhanced appearance of the right kidney and bilateral adrenal glands is normal. No hydroureteronephrosis. Urinary bladder is unremarkable in appearance. Stomach/Bowel: Unenhanced appearance of the stomach is normal. No pathologic dilatation of small bowel or colon. The appendix is not confidently identified and may be surgically absent. Regardless, there are no inflammatory changes noted adjacent to the cecum to suggest the presence of an acute appendicitis at this time. Vascular/Lymphatic: Aortic atherosclerosis. No crescentic high attenuation associated with the wall of  the abdominal aorta to suggest acute intramural hematoma. No lymphadenopathy noted in the abdomen or pelvis. Reproductive: Status post hysterectomy. Ovaries are not confidently identified and may be surgically absent or atrophic. Other: No significant volume of ascites.  No pneumoperitoneum. Musculoskeletal: There are no aggressive appearing lytic or blastic lesions noted in the visualized portions of the skeleton. IMPRESSION: 1. No definitive imaging findings on today's noncontrast CT examination to suggest acute aortic syndrome. 2. No acute findings are noted in the chest, abdomen or pelvis to account for the patient's symptoms. 3. Aortic atherosclerosis, in addition to right coronary artery disease. 4. Additional incidental findings, as above. Electronically Signed   By: Vinnie Langton M.D.   On: 11/17/2018 17:42   Ct Angio Head W Or Wo Contrast  Result Date: 11/20/2018 CLINICAL DATA:  Stroke follow-up EXAM: CT ANGIOGRAPHY HEAD AND NECK TECHNIQUE: Multidetector CT imaging of the head and neck was performed using the standard protocol during bolus administration of intravenous contrast. Multiplanar CT image reconstructions and MIPs were obtained to evaluate the vascular anatomy. Carotid stenosis measurements (when applicable) are obtained utilizing NASCET criteria, using the distal internal carotid diameter as the denominator. CONTRAST:  61m OMNIPAQUE IOHEXOL 350 MG/ML SOLN COMPARISON:  None. FINDINGS: CTA NECK FINDINGS SKELETON: There is no bony spinal canal stenosis. No lytic or blastic lesion. OTHER NECK: Normal pharynx, larynx and major salivary glands. No cervical lymphadenopathy. Unremarkable thyroid gland. UPPER CHEST: Biapical scarring AORTIC ARCH: There is mild calcific atherosclerosis of the aortic arch. There is no aneurysm, dissection or hemodynamically significant stenosis of the visualized portion of the aorta. Conventional 3 vessel aortic branching pattern. There is approximately 50% stenosis  of the proximal left subclavian artery, which is otherwise normal. Right subclavian artery visualized portion is normal. RIGHT CAROTID SYSTEM: No dissection, occlusion or aneurysm. Mild atherosclerotic calcification at the carotid bifurcation without hemodynamically significant stenosis. LEFT CAROTID SYSTEM: Normal without aneurysm, dissection or stenosis. VERTEBRAL ARTERIES: Left dominant configuration. Both origins are clearly patent. There is no dissection, occlusion or flow-limiting stenosis to the skull base (V1-V3 segments). CTA HEAD FINDINGS POSTERIOR CIRCULATION: --Vertebral arteries: Normal V4 segments. --Posterior inferior cerebellar arteries (PICA): Patent origins from the vertebral arteries. --Anterior inferior cerebellar arteries (AICA): Patent origins from the basilar artery. --Basilar artery: Diminutive basilar artery --Superior cerebellar arteries: Normal. --Posterior cerebral arteries: Both are predominantly supplied by the posterior communicating arteries (p-comm). Moderate stenosis of the proximal left P2 segment (series 11, image 20). ANTERIOR CIRCULATION: --Intracranial internal carotid arteries: Normal. --Anterior cerebral arteries (ACA): Normal. Both A1 segments are present. Patent anterior communicating artery (a-comm). --Middle cerebral arteries (MCA): Normal. VENOUS SINUSES: As permitted by contrast timing, patent. ANATOMIC VARIANTS: None Review of the MIP images confirms the above findings. IMPRESSION: 1. No emergent large vessel occlusion. 2. Moderate stenosis of the proximal left PCA P2 segment. 3. Unchanged  appearance of left cerebellar infarct. 4. Aortic Atherosclerosis (ICD10-I70.0). Electronically Signed   By: Ulyses Jarred M.D.   On: 11/20/2018 02:27   Ct Head Wo Contrast  Result Date: 11/19/2018 CLINICAL DATA:  85 year old with acute encephalopathy. She has Staph aureus sepsis/bacteremia. EXAM: CT HEAD WITHOUT CONTRAST TECHNIQUE: Contiguous axial images were obtained from the  base of the skull through the vertex without intravenous contrast. COMPARISON:  None. FINDINGS: Brain: Ventricular system normal in size and appearance for age. Mild age-appropriate cortical atrophy. Geographic low attenuation involving the LEFT superior cerebellar hemisphere extending into the LEFT cerebellar peduncle. No associated hemorrhage. Mild changes of small vessel disease of the white matter diffusely. No acute hemorrhage or hematoma. No extra-axial fluid collections. Vascular: Moderate BILATERAL carotid siphon and LEFT vertebral artery atherosclerosis. No hyperdense vessel. Skull: No skull fracture or other focal osseous abnormality involving the skull. Sinuses/Orbits: Visualized paranasal sinuses, bilateral mastoid air cells and bilateral middle ear cavities well-aerated. Visualized orbits and globes normal in appearance. Other: None. IMPRESSION: 1. Acute/subacute nonhemorrhagic stroke involving the LEFT superior cerebellar hemisphere extending into the LEFT cerebellar peduncle. 2. Mild age-appropriate cortical atrophy and mild chronic microvascular ischemic changes of the white matter. These results will be called to the ordering clinician or representative by the Radiologist Assistant, and communication documented in the PACS or zVision Dashboard. Electronically Signed   By: Evangeline Dakin M.D.   On: 11/19/2018 14:50   Ct Angio Neck W Or Wo Contrast  Result Date: 11/20/2018 CLINICAL DATA:  Stroke follow-up EXAM: CT ANGIOGRAPHY HEAD AND NECK TECHNIQUE: Multidetector CT imaging of the head and neck was performed using the standard protocol during bolus administration of intravenous contrast. Multiplanar CT image reconstructions and MIPs were obtained to evaluate the vascular anatomy. Carotid stenosis measurements (when applicable) are obtained utilizing NASCET criteria, using the distal internal carotid diameter as the denominator. CONTRAST:  70m OMNIPAQUE IOHEXOL 350 MG/ML SOLN COMPARISON:  None.  FINDINGS: CTA NECK FINDINGS SKELETON: There is no bony spinal canal stenosis. No lytic or blastic lesion. OTHER NECK: Normal pharynx, larynx and major salivary glands. No cervical lymphadenopathy. Unremarkable thyroid gland. UPPER CHEST: Biapical scarring AORTIC ARCH: There is mild calcific atherosclerosis of the aortic arch. There is no aneurysm, dissection or hemodynamically significant stenosis of the visualized portion of the aorta. Conventional 3 vessel aortic branching pattern. There is approximately 50% stenosis of the proximal left subclavian artery, which is otherwise normal. Right subclavian artery visualized portion is normal. RIGHT CAROTID SYSTEM: No dissection, occlusion or aneurysm. Mild atherosclerotic calcification at the carotid bifurcation without hemodynamically significant stenosis. LEFT CAROTID SYSTEM: Normal without aneurysm, dissection or stenosis. VERTEBRAL ARTERIES: Left dominant configuration. Both origins are clearly patent. There is no dissection, occlusion or flow-limiting stenosis to the skull base (V1-V3 segments). CTA HEAD FINDINGS POSTERIOR CIRCULATION: --Vertebral arteries: Normal V4 segments. --Posterior inferior cerebellar arteries (PICA): Patent origins from the vertebral arteries. --Anterior inferior cerebellar arteries (AICA): Patent origins from the basilar artery. --Basilar artery: Diminutive basilar artery --Superior cerebellar arteries: Normal. --Posterior cerebral arteries: Both are predominantly supplied by the posterior communicating arteries (p-comm). Moderate stenosis of the proximal left P2 segment (series 11, image 20). ANTERIOR CIRCULATION: --Intracranial internal carotid arteries: Normal. --Anterior cerebral arteries (ACA): Normal. Both A1 segments are present. Patent anterior communicating artery (a-comm). --Middle cerebral arteries (MCA): Normal. VENOUS SINUSES: As permitted by contrast timing, patent. ANATOMIC VARIANTS: None Review of the MIP images confirms the  above findings. IMPRESSION: 1. No emergent large vessel occlusion. 2. Moderate stenosis of  the proximal left PCA P2 segment. 3. Unchanged appearance of left cerebellar infarct. 4. Aortic Atherosclerosis (ICD10-I70.0). Electronically Signed   By: Ulyses Jarred M.D.   On: 11/20/2018 02:27   Ct Chest Wo Contrast  Result Date: 11/17/2018 CLINICAL DATA:  85 year old female with history of chest and back pain. Suspected acute aortic dissection. EXAM: CT CHEST, ABDOMEN AND PELVIS WITHOUT CONTRAST TECHNIQUE: Multidetector CT imaging of the chest, abdomen and pelvis was performed following the standard protocol without IV contrast. COMPARISON:  Chest CTA 01/31/2017. CT the abdomen and pelvis 09/09/2008. FINDINGS: CT CHEST FINDINGS Cardiovascular: Heart size is normal. There is no significant pericardial fluid, thickening or pericardial calcification. There is aortic atherosclerosis, as well as atherosclerosis of the great vessels of the mediastinum and the coronary arteries, including calcified atherosclerotic plaque in the right coronary artery. No crescentic high attenuation associated with the wall of the thoracic aorta to suggest the presence of acute intramural hemorrhage. Accurate assessment for aortic dissection is not possible on today's noncontrast examination. Mediastinum/Nodes: No pathologically enlarged mediastinal or hilar lymph nodes. Please note that accurate exclusion of hilar adenopathy is limited on noncontrast CT scans. Esophagus is unremarkable in appearance. No axillary lymphadenopathy. Lungs/Pleura: No acute consolidative airspace disease. No pleural effusions. Multiple linear areas of architectural distortion throughout the lungs bilaterally, favored to reflect areas of chronic post infectious or inflammatory scarring. No suspicious appearing pulmonary nodules or masses are noted. Musculoskeletal: There are no aggressive appearing lytic or blastic lesions noted in the visualized portions of the  skeleton. CT ABDOMEN PELVIS FINDINGS Hepatobiliary: No definite suspicious cystic or solid hepatic lesions are confidently identified on today's noncontrast CT examination. Unenhanced appearance of the gallbladder is normal. Pancreas: No definite pancreatic mass or peripancreatic fluid collections or inflammatory changes are noted on today's noncontrast examination. Spleen: Unremarkable. Adrenals/Urinary Tract: 2.6 cm exophytic low-attenuation lesion in upper pole of the left kidney, incompletely characterized on today's non-contrast CT examination, but statistically likely to represent a cyst. Unenhanced appearance of the right kidney and bilateral adrenal glands is normal. No hydroureteronephrosis. Urinary bladder is unremarkable in appearance. Stomach/Bowel: Unenhanced appearance of the stomach is normal. No pathologic dilatation of small bowel or colon. The appendix is not confidently identified and may be surgically absent. Regardless, there are no inflammatory changes noted adjacent to the cecum to suggest the presence of an acute appendicitis at this time. Vascular/Lymphatic: Aortic atherosclerosis. No crescentic high attenuation associated with the wall of the abdominal aorta to suggest acute intramural hematoma. No lymphadenopathy noted in the abdomen or pelvis. Reproductive: Status post hysterectomy. Ovaries are not confidently identified and may be surgically absent or atrophic. Other: No significant volume of ascites.  No pneumoperitoneum. Musculoskeletal: There are no aggressive appearing lytic or blastic lesions noted in the visualized portions of the skeleton. IMPRESSION: 1. No definitive imaging findings on today's noncontrast CT examination to suggest acute aortic syndrome. 2. No acute findings are noted in the chest, abdomen or pelvis to account for the patient's symptoms. 3. Aortic atherosclerosis, in addition to right coronary artery disease. 4. Additional incidental findings, as above.  Electronically Signed   By: Vinnie Langton M.D.   On: 11/17/2018 17:42   Mr Brain Wo Contrast  Result Date: 11/20/2018 CLINICAL DATA:  Stroke follow-up EXAM: MRI HEAD WITHOUT CONTRAST TECHNIQUE: Multiplanar, multiecho pulse sequences of the brain and surrounding structures were obtained without intravenous contrast. COMPARISON:  None. FINDINGS: BRAIN: There is a large acute infarct within the superior left cerebellar hemisphere. This extends into  the left superior cerebellar peduncle. There is no mass effect. Mild edema. There is no acute hemorrhage. Multifocal white matter hyperintensity, most commonly due to chronic ischemic microangiopathy. There is generalized atrophy without lobar predilection. The midline structures are normal. VASCULAR: The major intracranial arterial and venous sinus flow voids are normal. Medial left cerebellar focus of chronic microhemorrhage. SKULL AND UPPER CERVICAL SPINE: Calvarial bone marrow signal is normal. There is no skull base mass. The visualized upper cervical spine and soft tissues are normal. SINUSES/ORBITS: There are no fluid levels or advanced mucosal thickening. The mastoid air cells and middle ear cavities are free of fluid. The orbits are normal. IMPRESSION: 1. Large acute left cerebellar infarct without hemorrhage or mass effect. 2. Moderate chronic small vessel ischemic microangiopathy. Electronically Signed   By: Ulyses Jarred M.D.   On: 11/20/2018 03:12   Mr Thoracic Spine W Wo Contrast  Result Date: 11/18/2018 CLINICAL DATA:  Back pain EXAM: MRI THORACIC WITHOUT AND WITH CONTRAST TECHNIQUE: Multiplanar and multiecho pulse sequences of the thoracic spine were obtained without and with intravenous contrast. CONTRAST:  6m GADAVIST GADOBUTROL 1 MMOL/ML IV SOLN COMPARISON:  None. FINDINGS: MRI THORACIC SPINE FINDINGS Alignment:  Physiologic. Vertebrae: No fracture, evidence of discitis, or bone lesion. Cord:  Normal signal and morphology. Paraspinal and other  soft tissues: Small pleural effusions Disc levels: At T7-8, there is an intermediate sized central disc protrusion that effaces the ventral thecal sac and contacts the anterior surface of the spinal cord. There is no spinal canal stenosis. At T8-9, there is a small right subarticular disc protrusion without stenosis. At T9-10, there is a small disc osteophyte complex, greatest in the right subarticular zone, without spinal canal stenosis. At T11-12, there is a left subarticular disc protrusion without causing stenosis. IMPRESSION: 1. No discitis-osteomyelitis or epidural abscess. 2. 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 3. Central disc protrusion at T7-8 contacting the ventral surface of the spinal cord. Electronically Signed   By: KUlyses JarredM.D.   On: 11/18/2018 21:51   Mr Lumbar Spine W Wo Contrast  Result Date: 11/18/2018 CLINICAL DATA:  Back pain. Nausea and vomiting. EXAM: MRI LUMBAR SPINE WITHOUT AND WITH CONTRAST TECHNIQUE: Multiplanar and multiecho pulse sequences of the lumbar spine were obtained without and with intravenous contrast. CONTRAST:  658mGADAVIST GADOBUTROL 1 MMOL/ML IV SOLN COMPARISON:  None. FINDINGS: Segmentation:  Normal Alignment:  Dextroscoliosis with apex at L3 Vertebrae: No acute compression fracture, discitis-osteomyelitis, facet edema or other focal marrow lesion. No epidural collection. Conus medullaris and cauda equina: Conus extends to the L2 level. Conus and cauda equina appear normal. Paraspinal and other soft tissues: Negative. Disc levels: T12-L1: Normal disc space and facets. No spinal canal or neuroforaminal stenosis. L1-L2: Disc space narrowing with endplate spurring. No spinal canal stenosis. Severe right neural foraminal stenosis. L2-L3: Disc space narrowing with endplate spurring. Moderate bilateral neural foraminal stenosis. Mild facet hypertrophy. No central spinal canal stenosis. L3-L4: Disc space narrowing with diffuse bulge.  Moderate facet hypertrophy. Mild spinal canal stenosis with severe narrowing of the left lateral recess. Mild right and severe left neural foraminal stenosis. L4-L5: Small disc bulge with mild facet hypertrophy. No spinal canal stenosis. Mild right and severe left neural foraminal stenosis. L5-S1: Small disc bulge with severe right facet hypertrophy. There is severe right neural foraminal stenosis and narrowing of the right lateral recess. No central spinal canal or left neural foraminal stenosis. Visualized sacrum: Normal. IMPRESSION: 1. No epidural abscess or discitis-osteomyelitis.  2. 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 3. Multilevel moderate-to-severe neural foraminal stenosis. Electronically Signed   By: Ulyses Jarred M.D.   On: 11/18/2018 21:44   US Renal  Result Date: 11/22/2018 CLINICAL DATA:  Acute kidney injury EXAM: RENAL / URINARY TRACT ULTRASOUND COMPLETE COMPARISON:  CT abdomen pelvis dated 11/17/2018 and renal ultrasound dated 06/28/2012. FINDINGS: Right Kidney: Renal measurements: 10.2 x 6.0 x 5.6 cm = volume: 179 mL . Echogenicity within normal limits. No mass is visualized. There is moderate hydronephrosis. Left Kidney: Renal measurements: 11.3 x 5.8 x 5.2 cm = volume: 176 mL. Echogenicity within normal limits. A 2.1 cm cyst is seen. No solid mass is visualized. There is moderate hydronephrosis. Bladder: Appears normal for degree of bladder distention. Patient was unable to void there but urinary bladder. IMPRESSION: Moderate bilateral hydronephrosis. Electronically Signed   By: Zerita Boers M.D.   On: 11/22/2018 12:32   Mr Sacrum Si Joints W Wo Contrast  Result Date: 11/18/2018 CLINICAL DATA:  Back pain.  Possible infection. EXAM: MRI PELVIS WITHOUT AND WITH CONTRAST TECHNIQUE: Multiplanar multisequence MR imaging of the pelvis was performed both before and after administration of intravenous contrast. CONTRAST:  85m GADAVIST  GADOBUTROL 1 MMOL/ML IV SOLN COMPARISON:  CT scan from yesterday. FINDINGS: The bony structures are intact. No stress fracture or bone lesion. No findings suggests sacroiliitis or septic arthritis. Mild inflammatory changes involving the right piriformis muscle and also the upper aspect of the right iliopsoas muscle. This could reflect nonspecific myositis. Both hips are normally located. Small joint effusions but no findings suspicious for septic arthritis. No evidence of avascular necrosis. No significant intrapelvic abnormalities are identified. IMPRESSION: 1. No findings suspicious for septic arthritis or osteomyelitis involving the sacrum or SI joints or hip joints. 2. Areas of nonspecific myositis mainly involving the right piriformis muscle and the right iliopsoas muscle. 3. No significant intrapelvic abnormalities. Electronically Signed   By: PMarijo SanesM.D.   On: 11/18/2018 21:36   Dg Chest Port 1 View  Result Date: 11/12/2018 CLINICAL DATA:  Tachypnea EXAM: PORTABLE CHEST 1 VIEW COMPARISON:  01/30/2017 FINDINGS: Low volume chest with asymmetric elevation of the right diaphragm, chronic. Normal heart size and mediastinal contours for technique. There is no edema, consolidation, effusion, or pneumothorax. IMPRESSION: No evidence of acute disease. Electronically Signed   By: JMonte FantasiaM.D.   On: 11/12/2018 05:51   Ct Angio Chest/abd/pel For Dissection W And/or Wo Contrast  Result Date: 11/12/2018 CLINICAL DATA:  Chest and back pain with aortic dissection suspected EXAM: CT ANGIOGRAPHY CHEST, ABDOMEN AND PELVIS TECHNIQUE: Multidetector CT imaging through the chest, abdomen and pelvis was performed using the standard protocol during bolus administration of intravenous contrast. Multiplanar reconstructed images and MIPs were obtained and reviewed to evaluate the vascular anatomy. CONTRAST:  860mOMNIPAQUE IOHEXOL 350 MG/ML SOLN COMPARISON:  Abdomen and pelvis CT 09/09/2008.  Chest CTA 01/31/2017  FINDINGS: CTA CHEST FINDINGS Cardiovascular: Noncontrast phase shows no intramural hematoma no aortic dissection or aneurysm. No visible pulmonary artery filling defect. Scattered atherosclerotic calcification of the aorta. Right coronary calcified plaque is noted. Mild atheromatous narrowing at the proximal left subclavian artery. Mediastinum/Nodes: No adenopathy or mass. Lungs/Pleura: Mild dependent atelectasis and right upper lobe scarring. There is no edema, consolidation, effusion, or pneumothorax. Musculoskeletal: Diffuse degenerative disc narrowing with endplate ridging. There is exaggerated thoracic kyphosis and mild scoliosis. Notable T7-8 and T8-9 central disc protrusion. Review of  the MIP images confirms the above findings. CTA ABDOMEN AND PELVIS FINDINGS VASCULAR Aorta: Multifocal atherosclerotic plaque. No aneurysm or dissection. Celiac: High-grade narrowing at the celiac origin from atherosclerosis and likely from median arcuate ligament. SMA: Widely patent SMA. No branch occlusion or beading. Prominent pancreatic arcade compensating for the proximal celiac narrowing. Renals: Atherosclerotic plaque at both ostia without flow limiting stenosis. Early branching left renal artery. IMA: Patent Inflow: Atherosclerotic plaque without dissection or aneurysm. Veins: Negative in the arterial phase Review of the MIP images confirms the above findings. NON-VASCULAR Hepatobiliary: No focal liver abnormality.No evidence of biliary obstruction or stone. Pancreas: Unremarkable. Spleen: Unremarkable. Adrenals/Urinary Tract: Negative adrenals. Bilateral hydroureteronephrosis to the level of distended bladder. Stomach/Bowel:  No obstruction.  No evidence of inflammation. Lymphatic: No acute vascular abnormality.  No mass or adenopathy. Reproductive:Hysterectomy. Other: No ascites or pneumoperitoneum. Musculoskeletal: No acute abnormalities. Severe diffuse lumbar spine degeneration with dextroscoliosis. No acute osseous  finding Review of the MIP images confirms the above findings. IMPRESSION: 1. No acute vascular finding including evidence of acute aortic syndrome. 2. Atherosclerosis with proximal celiac narrowing but widely patent SMA and peripancreatic arcade. 3. Distended bladder resulting in bilateral hydroureteronephrosis. Electronically Signed   By: [place] Fantasia M.D.   On: 11/12/2018 06:50   Vas [place] Upper Extremity Venous Duplex  Result Date: 11/20/2018 UPPER VENOUS STUDY  Indications: shoulder pain Comparison Study: no prior Performing Technologist: June Leap RDMS, RVT  Examination Guidelines: A complete evaluation includes B-mode imaging, spectral Doppler, color Doppler, and power Doppler as needed of all accessible portions of each vessel. Bilateral testing is considered an integral part of a complete examination. Limited examinations for reoccurring indications may be performed as noted.  Left Findings: +----------+------------+---------+-----------+----------+-------+ LEFT      CompressiblePhasicitySpontaneousPropertiesSummary +----------+------------+---------+-----------+----------+-------+ IJV           Full       Yes       Yes                      +----------+------------+---------+-----------+----------+-------+ Subclavian    Full       Yes       Yes                      +----------+------------+---------+-----------+----------+-------+ Axillary      Full       Yes       Yes                      +----------+------------+---------+-----------+----------+-------+ Brachial      Full       Yes       Yes                      +----------+------------+---------+-----------+----------+-------+ Radial        Full                                          +----------+------------+---------+-----------+----------+-------+ Ulnar         Full                                          +----------+------------+---------+-----------+----------+-------+ Cephalic      None  Acute  +----------+------------+---------+-----------+----------+-------+ Basilic       Full                                          +----------+------------+---------+-----------+----------+-------+  Summary:  Left: No evidence of deep vein thrombosis in the upper extremity. Findings consistent with acute superficial vein thrombosis involving the left cephalic vein.  *See table(s) above for measurements and observations.  Diagnosing physician: Harold Barban MD Electronically signed by Harold Barban MD on 11/20/2018 at 6:59:46 AM.    Final    [place] Ekg Site Rite  Result Date: 11/25/2018 If Site Rite image not attached, placement could not be confirmed due to current cardiac rhythm.      Subjective: Patient seen and examined at bedside this morning.  Hemodynamically stable.  Comfortable.  Denies any complaints.  Appears to have generalized weakness but alert, awake and oriented.  Discharge Exam: Vitals:   11/26/18 1149 11/26/18 1232  BP: 133/68   Pulse: (!) 101 92  Resp: 20 (!) 22  Temp: 98.5 F (36.9 C)   SpO2: 96%    Vitals:   11/26/18 0700 11/26/18 0740 11/26/18 1149 11/26/18 1232  BP: (!) 164/79 (!) 156/70 133/68   Pulse: 91  (!) 101 92  Resp: (!) 23  20 (!) 22  Temp: 98.8 F (37.1 C)  98.5 F (36.9 C)   TempSrc: Oral  Oral   SpO2: 91%  96%   Weight:      Height:        General: Pt is alert, awake, not in acute distress Cardiovascular: RRR, S1/S2 +, no rubs, no gallops Respiratory: CTA bilaterally, no wheezing, no rhonchi Abdominal: Soft, NT, ND, bowel sounds + Extremities: no edema, no cyanosis    The results of significant diagnostics from this hospitalization (including imaging, microbiology, ancillary and laboratory) are listed below for reference.     Microbiology: Recent Results (from the past 240 hour(s))  Novel Coronavirus, NAA (Hosp order, Send-out to Ref Lab; TAT 18-24 hrs     Status: None   Collection Time:  11/16/18  4:51 PM   Specimen: Nasopharyngeal Swab; Respiratory  Result Value Ref Range Status   SARS-CoV-2, NAA NOT DETECTED NOT DETECTED Final    Comment: (NOTE) This nucleic acid amplification test was developed and its performance characteristics determined by [employer]. Nucleic acid amplification tests include PCR and TMA. This test has not been FDA cleared or approved. This test has been authorized by FDA under an Emergency Use Authorization (EUA). This test is only authorized for the duration of time the declaration that circumstances exist justifying the authorization of the emergency use of in vitro diagnostic tests for detection of SARS-CoV-2 virus and/or diagnosis of COVID-19 infection under section 564(b)(1) of the Act, 21 U.S.C. 967ELF-8(B) (1), unless the authorization is terminated or revoked sooner. When diagnostic testing is negative, the possibility of a false negative result should be considered in the context of a patient's recent exposures and the presence of clinical signs and symptoms consistent with COVID-19. An individual without symptoms of COVID- 19 and who is not shedding SARS-CoV-2 vi rus would expect to have a negative (not detected) result in this assay. Performed At: [facility] [address] [place], [place] [id number] Rush Farmer MD NI:[id number]    [place]  Final    Comment: Performed at [facility], [place]  [address] [place], [place] [address]  Culture, blood (routine x 2)     Status: Abnormal   Collection Time: 11/18/18  7:45 AM   Specimen: BLOOD LEFT ARM  Result Value Ref Range Status   Specimen Description BLOOD LEFT ARM  Final   Special Requests   Final    BOTTLES DRAWN AEROBIC ONLY Blood Culture results may not be optimal due to an inadequate volume of blood received in culture bottles   Culture  Setup Time   Final    AEROBIC BOTTLE ONLY GRAM POSITIVE COCCI CRITICAL VALUE NOTED.  VALUE  IS CONSISTENT WITH PREVIOUSLY REPORTED AND CALLED VALUE.    Culture (A)  Final    STAPHYLOCOCCUS AUREUS SUSCEPTIBILITIES PERFORMED ON PREVIOUS CULTURE WITHIN THE LAST 5 DAYS. Performed at [facility], [place] [address], [place], [place] [address]    Report Status 11/20/2018 FINAL  Final  Culture, blood (routine x 2)     Status: Abnormal   Collection Time: 11/18/18  7:51 AM   Specimen: BLOOD LEFT HAND  Result Value Ref Range Status   Specimen Description BLOOD LEFT HAND  Final   Special Requests   Final    BOTTLES DRAWN AEROBIC ONLY Blood Culture results may not be optimal due to an inadequate volume of blood received in culture bottles   Culture  Setup Time   Final    AEROBIC BOTTLE ONLY GRAM POSITIVE COCCI CRITICAL VALUE NOTED.  VALUE IS CONSISTENT WITH PREVIOUSLY REPORTED AND CALLED VALUE. Performed at [facility], [place] [address] [place], [place] [address]    Culture STAPHYLOCOCCUS AUREUS (A)  Final   Report Status 11/20/2018 FINAL  Final   Organism ID, Bacteria STAPHYLOCOCCUS AUREUS  Final      Susceptibility   Staphylococcus aureus - MIC*    CIPROFLOXACIN <=0.5 SENSITIVE Sensitive     ERYTHROMYCIN <=0.25 SENSITIVE Sensitive     GENTAMICIN <=0.5 SENSITIVE Sensitive     OXACILLIN <=0.25 SENSITIVE Sensitive     TETRACYCLINE <=1 SENSITIVE Sensitive     VANCOMYCIN <=0.5 SENSITIVE Sensitive     TRIMETH/SULFA <=10 SENSITIVE Sensitive     CLINDAMYCIN <=0.25 SENSITIVE Sensitive     RIFAMPIN <=0.5 SENSITIVE Sensitive     Inducible Clindamycin NEGATIVE Sensitive     * STAPHYLOCOCCUS AUREUS  MRSA PCR Screening     Status: None   Collection Time: 11/19/18 10:48 AM   Specimen: Nasal Mucosa; Nasopharyngeal  Result Value Ref Range Status   MRSA by PCR NEGATIVE NEGATIVE Final    Comment:        The GeneXpert MRSA Assay (FDA approved for NASAL specimens only), is one component of a comprehensive MRSA colonization surveillance program. It is not intended to diagnose  MRSA infection nor to guide or monitor treatment for MRSA infections. Performed at [facility], [place] [address] [place], [place] [address]   Culture, blood (routine x 2)     Status: Abnormal   Collection Time: 11/19/18 11:22 AM   Specimen: BLOOD  Result Value Ref Range Status   Specimen Description BLOOD RIGHT ANTECUBITAL  Final   Special Requests   Final    AEROBIC BOTTLE ONLY Blood Culture results may not be optimal due to an excessive volume of blood received in culture bottles   Culture  Setup Time   Final    AEROBIC BOTTLE ONLY GRAM POSITIVE COCCI CRITICAL VALUE NOTED.  VALUE IS CONSISTENT WITH PREVIOUSLY REPORTED AND CALLED VALUE.    Culture (A)  Final    STAPHYLOCOCCUS AUREUS SUSCEPTIBILITIES PERFORMED ON PREVIOUS CULTURE WITHIN THE LAST 5 DAYS. Performed at [facility], [place] [address] [place], [place] [address]    Report Status 11/20/2018 FINAL  Final  Culture, blood (routine x 2)     Status: Abnormal   Collection Time: 11/19/18 11:31 AM   Specimen: BLOOD RIGHT HAND  Result Value Ref Range Status   Specimen Description BLOOD RIGHT HAND  Final   Special Requests AEROBIC BOTTLE ONLY Blood Culture adequate volume  Final   Culture  Setup Time   Final    AEROBIC BOTTLE ONLY GRAM POSITIVE COCCI CRITICAL VALUE NOTED.  VALUE IS CONSISTENT WITH PREVIOUSLY REPORTED AND CALLED VALUE.    Culture (A)  Final    STAPHYLOCOCCUS AUREUS SUSCEPTIBILITIES PERFORMED ON PREVIOUS CULTURE WITHIN THE LAST 5 DAYS. Performed at [facility], [place] [address], [place], [place] [address]    Report Status 11/20/2018 FINAL  Final  Culture, blood (Routine X 2) w Reflex to ID Panel     Status: None   Collection Time: 11/20/18  4:09 PM   Specimen: BLOOD LEFT HAND  Result Value Ref Range Status   Specimen Description BLOOD LEFT HAND  Final   Special Requests   Final    BOTTLES DRAWN AEROBIC ONLY Blood Culture adequate volume   Culture   Final    NO GROWTH 5 DAYS Performed at  [facility], [place] [address] [place], [place] [address]    Report Status 11/25/2018 FINAL  Final  Culture, blood (Routine X 2) w Reflex to ID Panel     Status: None   Collection Time: 11/20/18  4:09 PM   Specimen: BLOOD LEFT HAND  Result Value Ref Range Status   Specimen Description BLOOD LEFT HAND  Final   Special Requests   Final    BOTTLES DRAWN AEROBIC ONLY Blood Culture adequate volume   Culture   Final    NO GROWTH 5 DAYS Performed at [facility], [place] [address], [place], [place] [address]    Report Status 11/25/2018 FINAL  Final  Novel Coronavirus, NAA (Hosp order, Send-out to Ref Lab; TAT 18-24 hrs     Status: Abnormal   Collection Time: 11/24/18  6:29 PM   Specimen: Nasopharyngeal Swab; Respiratory  Result Value Ref Range Status   SARS-CoV-2, NAA DETECTED (A) NOT DETECTED Final    Comment: RESULT CALLED TO, READ BACK BY AND VERIFIED WITH: S. BLEDSOE, RN AT 0240 ON 11/25/18 BY C. JESSUP, MT. (NOTE)                  Client Requested Flag This nucleic acid amplification test was developed and its performance characteristics determined by [employer]. Nucleic acid amplification tests include PCR and TMA. This test has not been FDA cleared or approved. This test has been authorized by FDA under an Emergency Use Authorization (EUA). This test is only authorized for the duration of time the declaration that circumstances exist justifying the authorization of the emergency use of in vitro diagnostic tests for detection of SARS-CoV-2 virus and/or diagnosis of COVID-19 infection under section 564(b)(1) of the Act, 21 U.S.C. 973ZHG-9(J) (1), unless the authorization is terminated or revoked sooner. When diagnostic testing is negative, the possibility of a false negative result should be considered in the context of a patient's recent exposures and the pres ence of clinical signs and symptoms consistent with COVID-19. An individual without symptoms of  COVID-  19 and who is not shedding SARS-CoV-2 virus would expect to have a negative (not detected) result in this assay. Performed At: [facility] [address] [place], [place] [id number] Rush Farmer MD GN:[id number]    [place]  Final    Comment: Performed at [facility], [place] [address] [place], [place] [address]  SARS Coronavirus 2 by RT PCR (hospital order, performed in [facility] hospital lab) Nasopharyngeal Nasopharyngeal Swab     Status: None   Collection Time: 11/25/18  2:09 PM   Specimen: Nasopharyngeal Swab  Result Value Ref Range Status   SARS Coronavirus 2 NEGATIVE NEGATIVE Final    Comment: (NOTE) If result is NEGATIVE SARS-CoV-2 target nucleic acids are NOT DETECTED. The SARS-CoV-2 RNA is generally detectable in upper and lower  respiratory specimens during the acute phase of infection. The lowest  concentration of SARS-CoV-2 viral copies this assay can detect is 250  copies / mL. A negative result does not preclude SARS-CoV-2 infection  and should not be used as the sole basis for treatment or other  patient management decisions.  A negative result may occur with  improper specimen collection / handling, submission of specimen other  than nasopharyngeal swab, presence of viral mutation(s) within the  areas targeted by this assay, and inadequate number of viral copies  (<250 copies / mL). A negative result must be combined with clinical  observations, patient history, and epidemiological information. If result is POSITIVE SARS-CoV-2 target nucleic acids are DETECTED. The SARS-CoV-2 RNA is generally detectable in upper and lower  respiratory specimens dur ing the acute phase of infection.  Positive  results are indicative of active infection with SARS-CoV-2.  Clinical  correlation with patient history and other diagnostic information is  necessary to determine patient infection status.  Positive results do  not rule out  bacterial infection or co-infection with other viruses. If result is PRESUMPTIVE POSTIVE SARS-CoV-2 nucleic acids MAY BE PRESENT.   A presumptive positive result was obtained on the submitted specimen  and confirmed on repeat testing.  While 2019 novel coronavirus  (SARS-CoV-2) nucleic acids may be present in the submitted sample  additional confirmatory testing may be necessary for epidemiological  and / or clinical management purposes  to differentiate between  SARS-CoV-2 and other Sarbecovirus currently known to infect humans.  If clinically indicated additional testing with an alternate test  methodology [phone]) is advised. The SARS-CoV-2 RNA is generally  detectable in upper and lower respiratory sp ecimens during the acute  phase of infection. The expected result is Negative. Fact Sheet for Patients:  StrictlyIdeas.no Fact Sheet for Healthcare Providers: BankingDealers.co.za This test is not yet approved or cleared by the [place] FDA and has been authorized for detection and/or diagnosis of SARS-CoV-2 by FDA under an Emergency Use Authorization (EUA).  This EUA will remain in effect (meaning this test can be used) for the duration of the COVID-19 declaration under Section 564(b)(1) of the Act, 21 U.S.C. section 360bbb-3(b)(1), unless the authorization is terminated or revoked sooner. Performed at [facility], [place] [address], [place], [place] [address]      Labs: BNP (last 3 results) Recent Labs    11/12/18 0210  BNP 17.9   Basic Metabolic Panel: Recent Labs  Lab 11/22/18 0345 11/23/18 0343 11/24/18 0353 11/25/18 0432 11/26/18 0328  NA 132* 133* 133* 135 135  K 3.3* 3.8 2.9* 4.2 3.9  CL 99 101 103 108 104  CO2 20* 18* 19*  19* 19*  GLUCOSE 162* 136* 141* 140* 119*  BUN 40* 56* 42* 29* 24*  CREATININE 1.51* 2.30* 1.90* 1.54* 1.33*  CALCIUM 7.7* 7.6* 7.6* 7.9* 8.3*   Liver Function Tests: No results  for input(s): AST, ALT, ALKPHOS, BILITOT, PROT, ALBUMIN in the last 168 hours. No results for input(s): LIPASE, AMYLASE in the last 168 hours. No results for input(s): AMMONIA in the last 168 hours. CBC: Recent Labs  Lab 11/22/18 0345 11/23/18 0343 11/24/18 0353 11/25/18 0432 11/26/18 0328  WBC 12.4* 18.5* 14.0* 17.0* 15.6*  HGB 10.2* 10.6* 9.4* 9.7* 9.8*  HCT 30.1* 29.9* 25.8* 28.2* 28.1*  MCV 100.0 96.5 95.9 96.9 95.6  PLT 152 128* 118* 154 183   Cardiac Enzymes: No results for input(s): CKTOTAL, CKMB, CKMBINDEX, TROPONINI in the last 168 hours. BNP: Invalid input(s): POCBNP CBG: No results for input(s): GLUCAP in the last 168 hours. D-Dimer No results for input(s): DDIMER in the last 72 hours. Hgb A1c No results for input(s): HGBA1C in the last 72 hours. Lipid Profile No results for input(s): CHOL, HDL, LDLCALC, TRIG, CHOLHDL, LDLDIRECT in the last 72 hours. Thyroid function studies No results for input(s): TSH, T4TOTAL, T3FREE, THYROIDAB in the last 72 hours.  Invalid input(s): FREET3 Anemia work up No results for input(s): VITAMINB12, FOLATE, FERRITIN, TIBC, IRON, RETICCTPCT in the last 72 hours. Urinalysis    Component Value Date/Time   COLORURINE YELLOW 11/12/2018 0222   APPEARANCEUR CLEAR 11/12/2018 0222   LABSPEC 1.012 11/12/2018 0222   PHURINE 5.0 11/12/2018 0222   GLUCOSEU NEGATIVE 11/12/2018 0222   HGBUR NEGATIVE 11/12/2018 0222   BILIRUBINUR NEGATIVE 11/12/2018 0222   KETONESUR 20 (A) 11/12/2018 0222   PROTEINUR NEGATIVE 11/12/2018 0222   NITRITE NEGATIVE 11/12/2018 0222   LEUKOCYTESUR NEGATIVE 11/12/2018 0222   Sepsis Labs Invalid input(s): PROCALCITONIN,  WBC,  LACTICIDVEN Microbiology Recent Results (from the past 240 hour(s))  Novel Coronavirus, NAA (Hosp order, Send-out to Ref Lab; TAT 18-24 hrs     Status: None   Collection Time: 11/16/18  4:51 PM   Specimen: Nasopharyngeal Swab; Respiratory  Result Value Ref Range Status   SARS-CoV-2, NAA  NOT DETECTED NOT DETECTED Final    Comment: (NOTE) This nucleic acid amplification test was developed and its performance characteristics determined by [employer]. Nucleic acid amplification tests include PCR and TMA. This test has not been FDA cleared or approved. This test has been authorized by FDA under an Emergency Use Authorization (EUA). This test is only authorized for the duration of time the declaration that circumstances exist justifying the authorization of the emergency use of in vitro diagnostic tests for detection of SARS-CoV-2 virus and/or diagnosis of COVID-19 infection under section 564(b)(1) of the Act, 21 U.S.C. 465KCL-2(X) (1), unless the authorization is terminated or revoked sooner. When diagnostic testing is negative, the possibility of a false negative result should be considered in the context of a patient's recent exposures and the presence of clinical signs and symptoms consistent with COVID-19. An individual without symptoms of COVID- 19 and who is not shedding SARS-CoV-2 vi rus would expect to have a negative (not detected) result in this assay. Performed At: [facility] [address] [place], [place] [id number] Rush Farmer MD SW:[id number]    [place]  Final    Comment: Performed at [facility], [place] [address], [place], [place] [address]  Culture, blood (routine x 2)     Status: Abnormal   Collection Time: 11/18/18  7:45 AM  Specimen: BLOOD LEFT ARM  Result Value Ref Range Status   Specimen Description BLOOD LEFT ARM  Final   Special Requests   Final    BOTTLES DRAWN AEROBIC ONLY Blood Culture results may not be optimal due to an inadequate volume of blood received in culture bottles   Culture  Setup Time   Final    AEROBIC BOTTLE ONLY GRAM POSITIVE COCCI CRITICAL VALUE NOTED.  VALUE IS CONSISTENT WITH PREVIOUSLY REPORTED AND CALLED VALUE.    Culture (A)  Final    STAPHYLOCOCCUS  AUREUS SUSCEPTIBILITIES PERFORMED ON PREVIOUS CULTURE WITHIN THE LAST 5 DAYS. Performed at [facility], [place] [address], [place], [place] [address]    Report Status 11/20/2018 FINAL  Final  Culture, blood (routine x 2)     Status: Abnormal   Collection Time: 11/18/18  7:51 AM   Specimen: BLOOD LEFT HAND  Result Value Ref Range Status   Specimen Description BLOOD LEFT HAND  Final   Special Requests   Final    BOTTLES DRAWN AEROBIC ONLY Blood Culture results may not be optimal due to an inadequate volume of blood received in culture bottles   Culture  Setup Time   Final    AEROBIC BOTTLE ONLY GRAM POSITIVE COCCI CRITICAL VALUE NOTED.  VALUE IS CONSISTENT WITH PREVIOUSLY REPORTED AND CALLED VALUE. Performed at [facility], [place] [address], [place], [place] [address]    Culture STAPHYLOCOCCUS AUREUS (A)  Final   Report Status 11/20/2018 FINAL  Final   Organism ID, Bacteria STAPHYLOCOCCUS AUREUS  Final      Susceptibility   Staphylococcus aureus - MIC*    CIPROFLOXACIN <=0.5 SENSITIVE Sensitive     ERYTHROMYCIN <=0.25 SENSITIVE Sensitive     GENTAMICIN <=0.5 SENSITIVE Sensitive     OXACILLIN <=0.25 SENSITIVE Sensitive     TETRACYCLINE <=1 SENSITIVE Sensitive     VANCOMYCIN <=0.5 SENSITIVE Sensitive     TRIMETH/SULFA <=10 SENSITIVE Sensitive     CLINDAMYCIN <=0.25 SENSITIVE Sensitive     RIFAMPIN <=0.5 SENSITIVE Sensitive     Inducible Clindamycin NEGATIVE Sensitive     * STAPHYLOCOCCUS AUREUS  MRSA PCR Screening     Status: None   Collection Time: 11/19/18 10:48 AM   Specimen: Nasal Mucosa; Nasopharyngeal  Result Value Ref Range Status   MRSA by PCR NEGATIVE NEGATIVE Final    Comment:        The GeneXpert MRSA Assay (FDA approved for NASAL specimens only), is one component of a comprehensive MRSA colonization surveillance program. It is not intended to diagnose MRSA infection nor to guide or monitor treatment for MRSA infections. Performed at [place], [place] [address] [place], [place] [address]   Culture, blood (routine x 2)     Status: Abnormal   Collection Time: 11/19/18 11:22 AM   Specimen: BLOOD  Result Value Ref Range Status   Specimen Description BLOOD RIGHT ANTECUBITAL  Final   Special Requests   Final    AEROBIC BOTTLE ONLY Blood Culture results may not be optimal due to an excessive volume of blood received in culture bottles   Culture  Setup Time   Final    AEROBIC BOTTLE ONLY GRAM POSITIVE COCCI CRITICAL VALUE NOTED.  VALUE IS CONSISTENT WITH PREVIOUSLY REPORTED AND CALLED VALUE.    Culture (A)  Final    STAPHYLOCOCCUS AUREUS SUSCEPTIBILITIES PERFORMED ON PREVIOUS CULTURE WITHIN THE LAST 5 DAYS. Performed at [facility], [place] [address],  [place], [place] [address]    Report Status 11/20/2018 FINAL  Final  Culture, blood (routine x 2)     Status: Abnormal   Collection Time: 11/19/18 11:31 AM   Specimen: BLOOD RIGHT HAND  Result Value Ref Range Status   Specimen Description BLOOD RIGHT HAND  Final   Special Requests AEROBIC BOTTLE ONLY Blood Culture adequate volume  Final   Culture  Setup Time   Final    AEROBIC BOTTLE ONLY GRAM POSITIVE COCCI CRITICAL VALUE NOTED.  VALUE IS CONSISTENT WITH PREVIOUSLY REPORTED AND CALLED VALUE.    Culture (A)  Final    STAPHYLOCOCCUS AUREUS SUSCEPTIBILITIES PERFORMED ON PREVIOUS CULTURE WITHIN THE LAST 5 DAYS. Performed at [facility], [place] [address] [place], [place] [address]    Report Status 11/20/2018 FINAL  Final  Culture, blood (Routine X 2) w Reflex to ID Panel     Status: None   Collection Time: 11/20/18  4:09 PM   Specimen: BLOOD LEFT HAND  Result Value Ref Range Status   Specimen Description BLOOD LEFT HAND  Final   Special Requests   Final    BOTTLES DRAWN AEROBIC ONLY Blood Culture adequate volume   Culture   Final    NO GROWTH 5 DAYS Performed at [facility], [place] [address], [place], [place] [address]    Report Status 11/25/2018 FINAL  Final   Culture, blood (Routine X 2) w Reflex to ID Panel     Status: None   Collection Time: 11/20/18  4:09 PM   Specimen: BLOOD LEFT HAND  Result Value Ref Range Status   Specimen Description BLOOD LEFT HAND  Final   Special Requests   Final    BOTTLES DRAWN AEROBIC ONLY Blood Culture adequate volume   Culture   Final    NO GROWTH 5 DAYS Performed at [facility], [place] [address] [place], [place] [address]    Report Status 11/25/2018 FINAL  Final  Novel Coronavirus, NAA (Hosp order, Send-out to Ref Lab; TAT 18-24 hrs     Status: Abnormal   Collection Time: 11/24/18  6:29 PM   Specimen: Nasopharyngeal Swab; Respiratory  Result Value Ref Range Status   SARS-CoV-2, NAA DETECTED (A) NOT DETECTED Final    Comment: RESULT CALLED TO, READ BACK BY AND VERIFIED WITH: S. BLEDSOE, RN AT 3536 ON 11/25/18 BY C. JESSUP, MT. (NOTE)                  Client Requested Flag This nucleic acid amplification test was developed and its performance characteristics determined by [employer]. Nucleic acid amplification tests include PCR and TMA. This test has not been FDA cleared or approved. This test has been authorized by FDA under an Emergency Use Authorization (EUA). This test is only authorized for the duration of time the declaration that circumstances exist justifying the authorization of the emergency use of in vitro diagnostic tests for detection of SARS-CoV-2 virus and/or diagnosis of COVID-19 infection under section 564(b)(1) of the Act, 21 U.S.C. 144RXV-4(M) (1), unless the authorization is terminated or revoked sooner. When diagnostic testing is negative, the possibility of a false negative result should be considered in the context of a patient's recent exposures and the pres ence of clinical signs and symptoms consistent with COVID-19. An individual without symptoms of COVID- 19 and who is not shedding SARS-CoV-2 virus would expect to have a negative (not detected) result in  this assay. Performed At: [facility] [phone]  [place], [place] [id number] Rush Farmer MD PJ:[id number]    Coronavirus Source NASOPHARYNGEAL  Final    Comment: Performed at [facility], [place] [address] [place], [place] [address]  SARS Coronavirus 2 by RT PCR (hospital order, performed in [facility] hospital lab) Nasopharyngeal Nasopharyngeal Swab     Status: None   Collection Time: 11/25/18  2:09 PM   Specimen: Nasopharyngeal Swab  Result Value Ref Range Status   SARS Coronavirus 2 NEGATIVE NEGATIVE Final    Comment: (NOTE) If result is NEGATIVE SARS-CoV-2 target nucleic acids are NOT DETECTED. The SARS-CoV-2 RNA is generally detectable in upper and lower  respiratory specimens during the acute phase of infection. The lowest  concentration of SARS-CoV-2 viral copies this assay can detect is 250  copies / mL. A negative result does not preclude SARS-CoV-2 infection  and should not be used as the sole basis for treatment or other  patient management decisions.  A negative result may occur with  improper specimen collection / handling, submission of specimen other  than nasopharyngeal swab, presence of viral mutation(s) within the  areas targeted by this assay, and inadequate number of viral copies  (<250 copies / mL). A negative result must be combined with clinical  observations, patient history, and epidemiological information. If result is POSITIVE SARS-CoV-2 target nucleic acids are DETECTED. The SARS-CoV-2 RNA is generally detectable in upper and lower  respiratory specimens dur ing the acute phase of infection.  Positive  results are indicative of active infection with SARS-CoV-2.  Clinical  correlation with patient history and other diagnostic information is  necessary to determine patient infection status.  Positive results do  not rule out bacterial infection or co-infection with other viruses. If result is PRESUMPTIVE POSTIVE SARS-CoV-2 nucleic  acids MAY BE PRESENT.   A presumptive positive result was obtained on the submitted specimen  and confirmed on repeat testing.  While 2019 novel coronavirus  (SARS-CoV-2) nucleic acids may be present in the submitted sample  additional confirmatory testing may be necessary for epidemiological  and / or clinical management purposes  to differentiate between  SARS-CoV-2 and other Sarbecovirus currently known to infect humans.  If clinically indicated additional testing with an alternate test  methodology [phone]) is advised. The SARS-CoV-2 RNA is generally  detectable in upper and lower respiratory sp ecimens during the acute  phase of infection. The expected result is Negative. Fact Sheet for Patients:  StrictlyIdeas.no Fact Sheet for Healthcare Providers: BankingDealers.co.za This test is not yet approved or cleared by the [place] FDA and has been authorized for detection and/or diagnosis of SARS-CoV-2 by FDA under an Emergency Use Authorization (EUA).  This EUA will remain in effect (meaning this test can be used) for the duration of the COVID-19 declaration under Section 564(b)(1) of the Act, 21 U.S.C. section 360bbb-3(b)(1), unless the authorization is terminated or revoked sooner. Performed at [facility], [place] [address] [place], [place] [address]     Please note: You were cared for by a hospitalist during your hospital stay. Once you are discharged, your primary care physician will handle any further medical issues. Please note that NO REFILLS for any discharge medications will be authorized once you are discharged, as it is imperative that you return to your primary care physician (or establish a relationship with a primary care physician if you do not have one) for your post hospital discharge needs so that they can reassess your need for medications and monitor  your lab values.    Time coordinating discharge: 40  minutes  SIGNED:   Shelly Coss, MD  [facility] 11/26/2018, 1:09 PM Pager [phone]  If 7PM-7AM, please contact night-coverage [email or web address] Password [email or web address]

## 2018-11-26 NOTE — Progress Notes (Signed)
Spoke with Primary RN Sonya re: PICC placement. It will be done tomorrow 10/14.

## 2018-11-27 DIAGNOSIS — R338 Other retention of urine: Secondary | ICD-10-CM

## 2018-11-27 DIAGNOSIS — E039 Hypothyroidism, unspecified: Secondary | ICD-10-CM

## 2018-11-27 DIAGNOSIS — I058 Other rheumatic mitral valve diseases: Secondary | ICD-10-CM

## 2018-11-27 DIAGNOSIS — I5043 Acute on chronic combined systolic (congestive) and diastolic (congestive) heart failure: Secondary | ICD-10-CM

## 2018-11-27 MED ORDER — SODIUM CHLORIDE 0.9% FLUSH
10.0000 mL | Freq: Two times a day (BID) | INTRAVENOUS | Status: DC
  Administered 2018-11-27 – 2018-11-28 (×3): 10 mL

## 2018-11-27 MED ORDER — SODIUM CHLORIDE 0.9% FLUSH: 10.0000 mL | INTRAVENOUS | Status: DC | PRN

## 2018-11-27 NOTE — Progress Notes (Signed)
Peripherally Inserted Central Catheter/Midline Placement  The IV Nurse has discussed with the patient and/or persons authorized to consent for the patient, the purpose of this procedure and the potential benefits and risks involved with this procedure.  The benefits include less needle sticks, lab draws from the catheter, and the patient may be discharged home with the catheter. Risks include, but not limited to, infection, bleeding, blood clot (thrombus formation), and puncture of an artery; nerve damage and irregular heartbeat and possibility to perform a PICC exchange if needed/ordered by physician.  Alternatives to this procedure were also discussed.  Bard Power PICC patient education guide, fact sheet on infection prevention and patient information card has been provided to patient /or left at bedside.    PICC/Midline Placement Documentation  PICC Single Lumen 11/27/18 PICC Right Cephalic 0 cm 0 cm (Active)  Indication for Insertion or Continuance of Line Prolonged intravenous therapies 11/27/18 0857  Exposed Catheter (cm) 0 cm 11/27/18 0857  Site Assessment Clean;Dry;Intact 11/27/18 0857  Line Status Flushed;Blood return noted 11/27/18 0857  Dressing Type Transparent 11/27/18 0857  Dressing Status Clean;Dry;Intact;Antimicrobial disc in place;Other (Comment) 11/27/18 0857  Dressing Intervention New dressing 11/27/18 0857  Dressing Change Due 12/04/18 11/27/18 0857   Telephone consent signed by son    Synthia Innocent 11/27/2018, 8:58 AM

## 2018-11-27 NOTE — Plan of Care (Signed)
  Problem: Education: Goal: Knowledge of General Education information will improve Description: Including pain rating scale, medication(s)/side effects and non-pharmacologic comfort measures Outcome: Progressing   Problem: Health Behavior/Discharge Planning: Goal: Ability to manage health-related needs will improve Outcome: Progressing   Problem: Clinical Measurements: Goal: Ability to maintain clinical measurements within normal limits will improve Outcome: Progressing Goal: Will remain free from infection Outcome: Progressing Goal: Diagnostic test results will improve Outcome: Progressing Goal: Respiratory complications will improve Outcome: Progressing Goal: Cardiovascular complication will be avoided Outcome: Progressing   Problem: Activity: Goal: Risk for activity intolerance will decrease Outcome: Progressing   Problem: Nutrition: Goal: Adequate nutrition will be maintained Outcome: Progressing   Problem: Coping: Goal: Level of anxiety will decrease Outcome: Progressing   Problem: Elimination: Goal: Will not experience complications related to bowel motility Outcome: Progressing Goal: Will not experience complications related to urinary retention Outcome: Progressing   Problem: Pain Managment: Goal: General experience of comfort will improve Outcome: Progressing   Problem: Skin Integrity: Goal: Risk for impaired skin integrity will decrease Outcome: Progressing   Problem: Education: Goal: Knowledge of disease or condition will improve Outcome: Progressing Goal: Knowledge of secondary prevention will improve Outcome: Progressing Goal: Knowledge of patient specific risk factors addressed and post discharge goals established will improve Outcome: Progressing Goal: Individualized Educational Video(s) Outcome: Progressing   Ival Bible, BSN, RN

## 2018-11-27 NOTE — Progress Notes (Signed)
Physical Therapy Treatment Patient Details Name: Autumn [name] MRN: [id number] DOB: Jan 15, 1933 Today's Date: 11/27/2018    History of Present Illness 85 y.o. female with past medical history significant for HTN, LBBB, and GERD who presents to the ED via EMS from [facility] center for acute onset neck and back pain as well as vomiting. In ED became orthostatic with 3 minutes of standing and found to have elevated troponing levels. Admitted for observation 9/29.    PT Comments    Pt performed transfer training from bed, and from recliner with mod +2 for safety.  She continues to flex hips and lack full extension in standing.  Pt remains unable to progress steps.  Plan for SNF placement remain appropriate.  Will continue efforts per POC.     Follow Up Recommendations  SNF;Supervision for mobility/OOB     Equipment Recommendations  Rolling walker with 5" wheels    Recommendations for Other Services       Precautions / Restrictions Precautions Precautions: Fall Precaution Comments: severe back pain Restrictions Weight Bearing Restrictions: No    Mobility  Bed Mobility Overal bed mobility: Needs Assistance Bed Mobility: Rolling Rolling: Mod assist Sidelying to sit: Max assist Supine to sit: Mod assist     General bed mobility comments: Pt rolling side to side due to pt wet the bed and BM. Pt required cleaning up and changing.  Transfers Overall transfer level: Needs assistance Equipment used: Rolling walker (2 wheeled) Transfers: Sit to/from [employer] Sit to Stand: Mod assist         General transfer comment: modA for elevated bed   Ambulation/Gait                 Stairs             Wheelchair Mobility    Modified Rankin (Stroke Patients Only)       Balance Overall balance assessment: Needs assistance Sitting-balance support: Feet supported;No upper extremity supported Sitting balance-Leahy Scale: Fair      Standing balance support: Bilateral upper extremity supported;During functional activity Standing balance-Leahy Scale: Poor Standing balance comment: leaning on counter or RW for mobility                            Cognition Arousal/Alertness: Awake/alert Behavior During Therapy: Flat affect Overall Cognitive Status: Within Functional Limits for tasks assessed Area of Impairment: Problem solving;Following commands;Safety/judgement;Orientation                 Orientation Level: Disoriented to;Situation;Time     Following Commands: Follows one step commands with increased time Safety/Judgement: Decreased awareness of safety   Problem Solving: Slow processing;Requires verbal cues;Requires tactile cues;Decreased initiation General Comments: Pt unable to perform multistep commands       Exercises Other Exercises Other Exercises: LUE elbow, wrist and hand exercises and elevated on pillow.    General Comments General comments (skin integrity, edema, etc.): pt yelps in pain with positional movement and then apologizes      Pertinent Vitals/Pain Pain Assessment: Faces Faces Pain Scale: Hurts whole lot Pain Location: low back Pain Descriptors / Indicators: Pressure;Constant;Grimacing Pain Intervention(s): Monitored during session;Repositioned    Home Living Family/patient expects to be discharged to:: Skilled nursing facility Living Arrangements: Alone Available Help at Discharge: Available PRN/intermittently Type of Home: Apartment Home Access: Elevator   Home Layout: One level Home Equipment: Grab bars - tub/shower;Grab bars - toilet Additional Comments: [employer]  independent living     Prior Function Level of Independence: Independent      Comments: reports occasional use of walking stick in public, independent in ADLs and iADLs, drives   PT Goals (current goals can now be found in the care plan section) Acute Rehab PT Goals Patient Stated Goal:  To try to move Potential to Achieve Goals: Good Progress towards PT goals: Progressing toward goals    Frequency    Min 3X/week      PT Plan Current plan remains appropriate    Co-evaluation PT/OT/SLP Co-Evaluation/Treatment: Yes Reason for Co-Treatment: Complexity of the patient's impairments (multi-system involvement);For patient/therapist safety PT goals addressed during session: Mobility/safety with mobility OT goals addressed during session: ADL's and self-care      AM-PAC PT "6 Clicks" Mobility   Outcome Measure  Help needed turning from your back to your side while in a flat bed without using bedrails?: A Lot Help needed moving from lying on your back to sitting on the side of a flat bed without using bedrails?: A Lot Help needed moving to and from a bed to a chair (including a wheelchair)?: A Lot Help needed standing up from a chair using your arms (e.g., wheelchair or bedside chair)?: A Lot Help needed to walk in hospital room?: Total Help needed climbing 3-5 steps with a railing? : Total 6 Click Score: 10    End of Session Equipment Utilized During Treatment: Gait belt Activity Tolerance: Patient limited by pain Patient left: in bed;with call bell/phone within reach;with bed alarm set;Other (comment) Nurse Communication: Mobility status PT Visit Diagnosis: Muscle weakness (generalized) (M62.81);Difficulty in walking, not elsewhere classified (R26.2);Other abnormalities of gait and mobility (R26.89)     Time: 5956-3875 PT Time Calculation (min) (ACUTE ONLY): 32 min  Charges:  $Gait Training: 8-22 mins $Therapeutic Activity: 8-22 mins                     Governor Rooks, PTA Acute Rehabilitation Services Pager [phone] Office [phone]     Autumn [name] Eli Hose 11/27/2018, 6:02 PM

## 2018-11-27 NOTE — Progress Notes (Addendum)
Occupational Therapy Treatment Patient Details Name: Autumn [name] MRN: [id number] DOB: 1932/04/06 Today's Date: 11/27/2018    History of present illness 85 y.o. female with past medical history significant for HTN, LBBB, and GERD who presents to the ED via EMS from [facility] center for acute onset neck and back pain as well as vomiting. In ED became orthostatic with 3 minutes of standing and found to have elevated troponing levels. Admitted for observation 9/29.   OT comments  Pt progressing well with this session. Pt rolling side to side due to pt wet the bed and BM. Pt required cleaning up and changing with near totalA. Pt performing sit to stands with modA. Pt performing ADL tasks at sink with minA for task and modA for standing. Pt transferred with PT to chair as a stand pivot as pt does not easily take steps. Pt continues to yelp in pain and unable to tolerate sitting upright in chair  >1 hour.  OT performing LUE elbow, wrist and hand exercises and elevated on pillow. Pt would benefit from continued OT skilled services for ADL, mobility and HEP. OT following acutely.    Follow Up Recommendations  SNF;Supervision/Assistance - 24 hour    Equipment Recommendations  3 in 1 bedside commode    Recommendations for Other Services      Precautions / Restrictions Precautions Precautions: Fall Precaution Comments: severe back pain Restrictions Weight Bearing Restrictions: No       Mobility Bed Mobility Overal bed mobility: Needs Assistance Bed Mobility: Rolling Rolling: Mod assist Sidelying to sit: Max assist Supine to sit: Mod assist     General bed mobility comments: Pt rolling side to side due to pt wet the bed and BM. Pt required cleaning up and changing.  Transfers Overall transfer level: Needs assistance Equipment used: Rolling walker (2 wheeled) Transfers: Sit to/from [employer] Sit to Stand: Mod assist         General transfer  comment: modA for elevated bed     Balance Overall balance assessment: Needs assistance Sitting-balance support: Feet supported;No upper extremity supported Sitting balance-Leahy Scale: Fair     Standing balance support: Bilateral upper extremity supported;During functional activity Standing balance-Leahy Scale: Poor Standing balance comment: leaning on counter or RW for mobility                           ADL either performed or assessed with clinical judgement   ADL Overall ADL's : Needs assistance/impaired     Grooming: Minimal assistance;Standing Grooming Details (indicate cue type and reason): ModA for standing stability; minA overall for task in standing- set-upA when seated                             Functional mobility during ADLs: Moderate assistance;+2 for safety/equipment;Rolling walker;Cueing for safety;Cueing for sequencing General ADL Comments: Pt minA for grooming at sink with constant cueing for next direction.     Vision Baseline Vision/History: Wears glasses Wears Glasses: At all times Patient Visual Report: No change from baseline Vision Assessment?: No apparent visual deficits   Perception     Praxis      Cognition Arousal/Alertness: Awake/alert Behavior During Therapy: Flat affect Overall Cognitive Status: Within Functional Limits for tasks assessed Area of Impairment: Problem solving;Following commands;Safety/judgement;Orientation                 Orientation Level: Disoriented to;Situation;Time  Following Commands: Follows one step commands with increased time Safety/Judgement: Decreased awareness of safety   Problem Solving: Slow processing;Requires verbal cues;Requires tactile cues;Decreased initiation General Comments: Pt unable to perform multistep commands         Exercises Exercises: Other exercises Other Exercises Other Exercises: LUE elbow, wrist and hand exercises and elevated on pillow.    Shoulder Instructions       General Comments pt yelps in pain with positional movement and then apologizes    Pertinent Vitals/ Pain       Pain Assessment: Faces Faces Pain Scale: Hurts whole lot Pain Location: low back Pain Descriptors / Indicators: Pressure;Constant;Grimacing Pain Intervention(s): Monitored during session  Home Living Family/patient expects to be discharged to:: Skilled nursing facility Living Arrangements: Alone Available Help at Discharge: Available PRN/intermittently Type of Home: Apartment Home Access: [place]: One level     Bathroom Shower/Tub: Occupational psychologist: Handicapped height Bathroom Accessibility: Yes   Home Equipment: Grab bars - tub/shower;Grab bars - toilet   Additional Comments: [employer] independent living       Prior Functioning/Environment Level of Independence: Independent        Comments: reports occasional use of walking stick in public, independent in ADLs and iADLs, drives   Frequency  Min 2X/week        Progress Toward Goals  OT Goals(current goals can now be found in the care plan section)     Acute Rehab OT Goals Patient Stated Goal: To try to move OT Goal Formulation: With patient Time For Goal Achievement: 12/04/18 Potential to Achieve Goals: Good  Plan      Co-evaluation    PT/OT/SLP Co-Evaluation/Treatment: Yes Reason for Co-Treatment: Complexity of the patient's impairments (multi-system involvement);To address functional/ADL transfers   OT goals addressed during session: ADL's and self-care      AM-PAC OT "6 Clicks" Daily Activity     Outcome Measure   Help from another person eating meals?: A Lot Help from another person taking care of personal grooming?: A Lot Help from another person toileting, which includes using toliet, bedpan, or urinal?: Total Help from another person bathing (including washing, rinsing, drying)?: Total Help from another  person to put on and taking off regular upper body clothing?: Total Help from another person to put on and taking off regular lower body clothing?: Total 6 Click Score: 8    End of Session Equipment Utilized During Treatment: Gait belt;Rolling walker  OT Visit Diagnosis: Other abnormalities of gait and mobility (R26.89);Pain;Muscle weakness (generalized) (M62.81);Other symptoms and signs involving cognitive function Pain - Right/Left: Left Pain - part of body: Shoulder;Arm   Activity Tolerance Patient limited by lethargy;Patient limited by pain   Patient Left in chair;with call bell/phone within reach;with chair alarm set;with family/visitor present   Nurse Communication Mobility status        Time: 4580-9983 OT Time Calculation (min): 33 min  Charges: OT General Charges $OT Visit: 1 Visit OT Treatments $Self Care/Home Management : 8-22 mins  Darryl Nestle) Marsa Aris OTR/L Acute Rehabilitation Services Pager: [phone] Office: [phone]    Audie Pinto 11/27/2018, 4:58 PM

## 2018-11-27 NOTE — TOC Progression Note (Signed)
Transition of Care [facility]) - Progression Note    Patient Details  Name: Autumn [name] MRN: [id number] Date of Birth: 1932/11/12  Transition of Care [facility]) CM/SW [place], [place] Phone Number: 11/27/2018, 10:40 AM  Clinical Narrative:   CSW received authorization for patient to admit to [facility] and notified by RN that patient's picc has been placed this morning. CSW to complete discharge when orders in place, if stable for discharge today.    Expected Discharge Plan: Skilled Nursing Facility Barriers to Discharge: SNF Pending discharge orders, SNF Pending discharge summary  Expected Discharge Plan and Services Expected Discharge Plan: [place] In-house Referral: NA Discharge Planning Services: NA Post Acute Care Choice: [place] Living arrangements for the past 2 months: Single Family Home Expected Discharge Date: 11/15/18                                     Social Determinants of Health (SDOH) Interventions    Readmission Risk Interventions No flowsheet data found.

## 2018-11-27 NOTE — Plan of Care (Signed)
Problem: Education: Goal: Knowledge of General Education information will improve Description: Including pain rating scale, medication(s)/side effects and non-pharmacologic comfort measures 11/27/2018 0029 by Ronalee Red, RN Outcome: Progressing 11/27/2018 0029 by Ronalee Red, RN Outcome: Progressing 11/27/2018 0027 by Ronalee Red, RN Outcome: Progressing   Problem: Health Behavior/Discharge Planning: Goal: Ability to manage health-related needs will improve 11/27/2018 0029 by Ronalee Red, RN Outcome: Progressing 11/27/2018 0029 by Ronalee Red, RN Outcome: Progressing 11/27/2018 0027 by Ronalee Red, RN Outcome: Progressing   Problem: Clinical Measurements: Goal: Ability to maintain clinical measurements within normal limits will improve 11/27/2018 0029 by Ronalee Red, RN Outcome: Progressing 11/27/2018 0029 by Ronalee Red, RN Outcome: Progressing 11/27/2018 0027 by Ronalee Red, RN Outcome: Progressing Goal: Will remain free from infection 11/27/2018 0029 by Ronalee Red, RN Outcome: Progressing 11/27/2018 0029 by Ronalee Red, RN Outcome: Progressing 11/27/2018 0027 by Ronalee Red, RN Outcome: Progressing Goal: Diagnostic test results will improve 11/27/2018 0029 by Ronalee Red, RN Outcome: Progressing 11/27/2018 0029 by Ronalee Red, RN Outcome: Progressing 11/27/2018 0027 by Ronalee Red, RN Outcome: Progressing Goal: Respiratory complications will improve 11/27/2018 0029 by Ronalee Red, RN Outcome: Progressing 11/27/2018 0029 by Ronalee Red, RN Outcome: Progressing 11/27/2018 0027 by Ronalee Red, RN Outcome: Progressing Goal: Cardiovascular complication will be avoided 11/27/2018 0029 by Ronalee Red, RN Outcome: Progressing 11/27/2018 0029 by Ronalee Red, RN Outcome: Progressing 11/27/2018 0027 by Ronalee Red, RN Outcome: Progressing   Problem: Activity: Goal: Risk for activity intolerance will  decrease 11/27/2018 0029 by Ronalee Red, RN Outcome: Progressing 11/27/2018 0029 by Ronalee Red, RN Outcome: Progressing 11/27/2018 0027 by Ronalee Red, RN Outcome: Progressing   Problem: Nutrition: Goal: Adequate nutrition will be maintained 11/27/2018 0029 by Ronalee Red, RN Outcome: Progressing 11/27/2018 0029 by Ronalee Red, RN Outcome: Progressing 11/27/2018 0027 by Ronalee Red, RN Outcome: Progressing   Problem: Coping: Goal: Level of anxiety will decrease 11/27/2018 0029 by Ronalee Red, RN Outcome: Progressing 11/27/2018 0029 by Ronalee Red, RN Outcome: Progressing 11/27/2018 0027 by Ronalee Red, RN Outcome: Progressing   Problem: Elimination: Goal: Will not experience complications related to bowel motility 11/27/2018 0029 by Ronalee Red, RN Outcome: Progressing 11/27/2018 0029 by Ronalee Red, RN Outcome: Progressing 11/27/2018 0027 by Ronalee Red, RN Outcome: Progressing Goal: Will not experience complications related to urinary retention 11/27/2018 0029 by Ronalee Red, RN Outcome: Progressing 11/27/2018 0029 by Ronalee Red, RN Outcome: Progressing 11/27/2018 0027 by Ronalee Red, RN Outcome: Progressing   Problem: Pain Managment: Goal: General experience of comfort will improve 11/27/2018 0029 by Ronalee Red, RN Outcome: Progressing 11/27/2018 0029 by Ronalee Red, RN Outcome: Progressing 11/27/2018 0027 by Ronalee Red, RN Outcome: Progressing   Problem: Skin Integrity: Goal: Risk for impaired skin integrity will decrease 11/27/2018 0029 by Ronalee Red, RN Outcome: Progressing 11/27/2018 0029 by Ronalee Red, RN Outcome: Progressing 11/27/2018 0027 by Ronalee Red, RN Outcome: Progressing   Problem: Education: Goal: Knowledge of disease or condition will improve 11/27/2018 0029 by Ronalee Red, RN Outcome: Progressing 11/27/2018 0029 by Ronalee Red, RN Outcome: Progressing Goal:  Knowledge of secondary prevention will improve 11/27/2018 0029 by Ronalee Red, RN Outcome: Progressing 11/27/2018 0029 by Ronalee Red, RN Outcome: Progressing Goal: Knowledge of patient specific risk factors addressed and post discharge goals established  will improve 11/27/2018 0029 by Lennox Grumbles, RN Outcome: Progressing 11/27/2018 0029 by Lennox Grumbles, RN Outcome: Progressing Goal: Individualized Educational Video(s) 11/27/2018 0029 by Lennox Grumbles, RN Outcome: Progressing 11/27/2018 0029 by Lennox Grumbles, RN Outcome: Progressing  Ival Bible, BSN, RN

## 2018-11-27 NOTE — Progress Notes (Signed)
PROGRESS NOTE    Autumn KillianKaren M [name]  WUJ:[id number] DOB: 08/03/1932 DOA: 11/12/2018 PCP: Merlene LaughterStoneking, Hal, MD    Brief Narrative:  Autumn RaiderKaren M Kroneis a 85 y.o.femalewith medical history significant ofhypertension, hypothyroidism,LBBB,arthritis,andGERD;who initially  presented with complaints of nausea and vomiting.Hospital course complicated by elevated troponin, abnormal 2D echo with LVEF 25%, small to moderate pericardial effusion. Seen by cardiology and patient declined aggressive cardiac work-up per cardiology. Blood cultures also showed MSSA . ID consulted.  TEE showed posterior mitral valve echodensity suspicious for vegetation.  Started on ceftriaxone.  Patient underwent CT head which revealed left acute/subacute cerebellar ischemic infarct. Neurology consulted and was following.  Hospital course also remarkable for false positive Covid test.  Covid test repeated on the same day resulted negative.  ID recommended to continue ceftriaxone through 12/31/18.  She has been seen by PT and OT and recommended skilled nursing facility on discharge.     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  Cerebellar hemisphere extending to  the left cerebellar peduncle.  Further work-up with MRI revealed large acute left cerebellar infarct without hemorrhage or mass-effect.  TEE done concerning for endocarditis.  Patient maintained on aspirin and Lipitor.  Neurology assessed the patient will follow the patient throughout the hospitalization.  Patient was seen by PT OT.  Will need outpatient follow-up with neurology in 4 weeks.  Neurology signed off 11/20/2018.  2.  Sepsis secondary to MSSA bacteremia/endocarditis Not POA.  Patient seen in consultation by ID will follow the patient throughout the hospitalization.  TEE which was done on 11/20/2018 revealed posterior mitral valve echodensity suspicious for vegetation.  CT surgery was consulted and was felt patient was a nonsurgical candidate in light of endocarditis and acute CVA with concerns for transformation hemorrhagic CVA.  Repeat blood cultures done negative.  PICC line ordered and placed.  Due to worsening renal function IV antibiotics were changed to IV Rocephin per ID.  Outpatient follow-up with ID.  3.  Acute kidney injury/urinary retention Patient noted to have a creatinine go up as high as 1.51 on 11/22/2018.  Patient noted to have a documented episode of hypotension around 11:30 PM with blood pressure of 84/42.  Blood pressure improved.  Fracture excretion of sodium was 0.42% pointing towards a prerenal etiology.  Renal ultrasound which was done revealed bilateral moderate hydronephrosis.  Patient also noted to have some urinary retention.  Patient has required INO cath x3.  Foley catheter ordered but not placed yesterday.  Will reorder Foley catheter patient with some lower abdominal suprapubic tenderness.  Renal function slowly trending down.  Repeat labs in the morning.  Will need outpatient follow-up with urology.  Follow.  4.  Intractable nausea and abdominal pain, unclear etiology Questionable etiology.  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  Patient seen in consultation by vascular surgery which feels this was not the likely etiology of patient's nausea and abdominal pain.  Patient did report NSAID use and ibuprofen for months with a 5 pound unintentional weight loss over the past 4 to 8 weeks.  GI was consulted to assess patient however patient declined EGD.  NSAIDs have been discontinued.  GI recommended Protonix 40 mg twice daily x6 weeks and then 40 mg daily.  Outpatient follow-up with GI.  GI signed off 11/14/2018.  5.  Intractable lower back pain in the setting of chronic back pain likely multifactorial secondary to severe spinal stenosis at L3-4 and MSSA bacteremia. MRI of the T-spine L-spine and L-spine was done on 11/18/2018 that showed severe stenosis at L3-L4 with disc bulge and central disc protrusion at T7-T8.  Patient assessed by neurosurgery who feel no intervention is needed at this time.  PT/OT.  Continue current pain management.  Outpatient follow-up.  6.  Acute on chronic combined systolic and diastolic CHF/small-to-moderate pericardial effusion Patient seen by Dr. Antoine Poche of cardiology and per review of medical records noted to have an EF of 35% on 04/10/2014.  Patient had a left heart cath done September 02, 2013 with history of nonischemic cardiomyopathy. Repeat 2D echo done during this hospitalization on 11/13/2018 showed a EF of 25% with diffuse hypokinesis and septal lateral dyssynchrony consistent with chronic left bundle branch block as well as grade 1 diastolic dysfunction.  Audiology consulted and patient declined aggressive cardiac work-up.  Outpatient follow-up with cardiology APP in 2 weeks and with Dr. Antoine Poche in 1 to 2 months.  Continue current regimen of carvedilol 12.5 mg twice daily, losartan 100 mg daily, Lasix 20 mg as needed on discharge.  Patient will likely need repeat 2D echo in 3 to 6 months.  Cardiology signed off on 11/15/2018.  Patient currently stable.  Outpatient follow-up with cardiology.  7.   Generalized weakness with physical debility likely in the setting of acute illness PT/OT.  Awaiting SNF placement.  8.  False-positive COVID-19 False-positive COVID-19 test with repeat Hologic COVID-19 test negative.  ID reviewed patient's chart and ID feels lab course test was a false positive likely with low cycle number.  Repeat call with test now negative x2.  Airborne/contact precautions has been discontinued per ID.  No further repeat testing needed at this time.  9.  Hypertension complicated by orthostatic hypotension. Diuretics held due to orthostatic hypotension.  Patient currently euvolemic.  BNP 65 on 11/12/2018.  Continue current regimen of Coreg and Cozaar on discharge.  Follow.  10.  Hyperlipidemia Continue statin.  11.  Chronic euvolemic hyponatremia Currently asymptomatic.  Stable.  12.  Hypothyroidism Continue home dose Synthroid.  Outpatient follow-up.  13.  Gastroesophageal reflux disease Continue PPI.  14.  Acute superficial vein thrombosis involving the left cephalic vein Continue supportive care.    DVT prophylaxis: SCDs Code Status: DNR Family Communication: Updated patient.  No family at bedside. Disposition Plan: Awaiting SNF placement.   Consultants:   Cardiology: Dr. Diona Browner 11/12/2018  Vascular surgery: Dr. Myra Gianotti 11/13/2018  Gastroenterology: Dr. Barron Alvine 11/14/2018  Neurosurgery: Dr. Venetia Maxon 11/19/2018  Infectious disease: Dr. Daiva Eves 11/19/2018  Neurology: Dr.Aroor 11/19/2018  Cardiothoracic surgery: Dr. Cliffton Asters 12/03/2018  Palliative care: Norvel Richards, PA 11/23/2018  Procedures:   TEE 11/20/2018  2D echo 11/19/2018, 11/13/2018  Upper extremity Dopplers 11/19/2018  MRI brain 11/20/2018  MRI T-spine/L-spine/L-spine 11/18/2018  CT head 11/19/2018  CT angiogram head and neck 11/20/2018  CT abdomen and pelvis 11/17/2018  CT angiogram chest abdomen and pelvis 11/12/2018  Chest x-ray 11/12/2018  PICC line 11/27/2018   Antimicrobials:   IV Rocephin 11/22/2018  IV vancomycin 11/19/2018>>>> 11/20/2018  IV nafcillin 11/20/2018>>>>> 11/22/2018     Subjective: Patient laying in bed.  Breakfast in front of patient.  Patient complained of some lower abdominal discomfort.  Patient states Foley catheter was never placed last night.  Denies any chest pain or shortness of breath.  Objective: Vitals:   11/26/18 2146 11/26/18 2338 11/27/18 0242 11/27/18 0348  BP: (!) 152/62 123/87  (!) 144/67  Pulse: 81 76  77  Resp:  18  17  Temp:  98.8 F (37.1 C)  98.6 F (37 C)  TempSrc:  Oral  Oral  SpO2:  97%  92%  Weight:   77.4 kg   Height:        Intake/Output Summary (Last 24 hours) at 11/27/2018 0946 Last data filed at 11/27/2018 0419 Gross per 24 hour  Intake 650 ml  Output 1450 ml  Net -800 ml   Filed Weights   11/25/18 0500 11/26/18 0357 11/27/18 0242  Weight: 75.8 kg 75.8 kg 77.4 kg    Examination:  General exam: Appears calm and comfortable  Respiratory system: Clear to auscultation anterior lung fields. Respiratory effort normal. Cardiovascular system: S1 & S2 heard, RRR. No JVD, murmurs, rubs, gallops or clicks. No pedal edema. Gastrointestinal system: Abdomen is nondistended, soft and some tenderness to palpation in suprapubic region.  No rebound.  No guarding.  Positive bowel sounds.  Central nervous system: Alert and oriented. No focal neurological deficits. Extremities: Symmetric 5 x 5 power. Skin: No rashes, lesions or ulcers Psychiatry: Judgement and insight appear normal. Mood & affect appropriate.     Data Reviewed: I have personally reviewed following labs and imaging studies  CBC: Recent Labs  Lab 11/22/18 0345 11/23/18 0343 11/24/18 0353 11/25/18 0432 11/26/18 0328  WBC 12.4* 18.5* 14.0* 17.0* 15.6*  HGB 10.2* 10.6* 9.4* 9.7* 9.8*  HCT 30.1* 29.9* 25.8* 28.2* 28.1*  MCV 100.0 96.5 95.9 96.9 95.6  PLT 152 128* 118* 154 183   Basic Metabolic Panel: Recent Labs  Lab  11/22/18 0345 11/23/18 0343 11/24/18 0353 11/25/18 0432 11/26/18 0328  NA 132* 133* 133* 135 135  K 3.3* 3.8 2.9* 4.2 3.9  CL 99 101 103 108 104  CO2 20* 18* 19* 19* 19*  GLUCOSE 162* 136* 141* 140* 119*  BUN 40* 56* 42* 29* 24*  CREATININE 1.51* 2.30* 1.90* 1.54* 1.33*  CALCIUM 7.7* 7.6* 7.6* 7.9* 8.3*   GFR: Estimated Creatinine Clearance: 31 mL/min (A) (by C-G formula based on SCr of 1.33 mg/dL (H)). Liver Function Tests: No results for input(s): AST, ALT, ALKPHOS, BILITOT, PROT, ALBUMIN in the last 168 hours. No results for input(s): LIPASE, AMYLASE in the last 168 hours. No results for input(s): AMMONIA in the last 168 hours. Coagulation Profile: No results for input(s): INR, PROTIME in the last 168 hours. Cardiac Enzymes: No results for input(s): CKTOTAL, CKMB, CKMBINDEX, TROPONINI in the last 168 hours. BNP (last 3 results) No results for input(s): PROBNP in the last 8760 hours. HbA1C: No results for input(s): HGBA1C in the last 72 hours. CBG: No results for input(s): GLUCAP in the last 168 hours. Lipid Profile: No results for input(s): CHOL, HDL, LDLCALC, TRIG, CHOLHDL, LDLDIRECT in the last 72 hours. Thyroid Function Tests: No results for input(s):  TSH, T4TOTAL, FREET4, T3FREE, THYROIDAB in the last 72 hours. Anemia Panel: No results for input(s): VITAMINB12, FOLATE, FERRITIN, TIBC, IRON, RETICCTPCT in the last 72 hours. Sepsis Labs: No results for input(s): PROCALCITON, LATICACIDVEN in the last 168 hours.  Recent Results (from the past 240 hour(s))  Culture, blood (routine x 2)     Status: Abnormal   Collection Time: 11/18/18  7:45 AM   Specimen: BLOOD LEFT ARM  Result Value Ref Range Status   Specimen Description BLOOD LEFT ARM  Final   Special Requests   Final    BOTTLES DRAWN AEROBIC ONLY Blood Culture results may not be optimal due to an inadequate volume of blood received in culture bottles   Culture  Setup Time   Final    AEROBIC BOTTLE ONLY GRAM  POSITIVE COCCI CRITICAL VALUE NOTED.  VALUE IS CONSISTENT WITH PREVIOUSLY REPORTED AND CALLED VALUE.    Culture (A)  Final    STAPHYLOCOCCUS AUREUS SUSCEPTIBILITIES PERFORMED ON PREVIOUS CULTURE WITHIN THE LAST 5 DAYS. Performed at [facility], [place] [address], [place], [place] [address]    Report Status 11/20/2018 FINAL  Final  Culture, blood (routine x 2)     Status: Abnormal   Collection Time: 11/18/18  7:51 AM   Specimen: BLOOD LEFT HAND  Result Value Ref Range Status   Specimen Description BLOOD LEFT HAND  Final   Special Requests   Final    BOTTLES DRAWN AEROBIC ONLY Blood Culture results may not be optimal due to an inadequate volume of blood received in culture bottles   Culture  Setup Time   Final    AEROBIC BOTTLE ONLY GRAM POSITIVE COCCI CRITICAL VALUE NOTED.  VALUE IS CONSISTENT WITH PREVIOUSLY REPORTED AND CALLED VALUE. Performed at [facility], [place] [address] [place], [place] [address]    Culture STAPHYLOCOCCUS AUREUS (A)  Final   Report Status 11/20/2018 FINAL  Final   Organism ID, Bacteria STAPHYLOCOCCUS AUREUS  Final      Susceptibility   Staphylococcus aureus - MIC*    CIPROFLOXACIN <=0.5 SENSITIVE Sensitive     ERYTHROMYCIN <=0.25 SENSITIVE Sensitive     GENTAMICIN <=0.5 SENSITIVE Sensitive     OXACILLIN <=0.25 SENSITIVE Sensitive     TETRACYCLINE <=1 SENSITIVE Sensitive     VANCOMYCIN <=0.5 SENSITIVE Sensitive     TRIMETH/SULFA <=10 SENSITIVE Sensitive     CLINDAMYCIN <=0.25 SENSITIVE Sensitive     RIFAMPIN <=0.5 SENSITIVE Sensitive     Inducible Clindamycin NEGATIVE Sensitive     * STAPHYLOCOCCUS AUREUS  MRSA PCR Screening     Status: None   Collection Time: 11/19/18 10:48 AM   Specimen: Nasal Mucosa; Nasopharyngeal  Result Value Ref Range Status   MRSA by PCR NEGATIVE NEGATIVE Final    Comment:        The GeneXpert MRSA Assay (FDA approved for NASAL specimens only), is one component of a comprehensive MRSA colonization surveillance  program. It is not intended to diagnose MRSA infection nor to guide or monitor treatment for MRSA infections. Performed at [facility], [place] [address] [place], [place] [address]   Culture, blood (routine x 2)     Status: Abnormal   Collection Time: 11/19/18 11:22 AM   Specimen: BLOOD  Result Value Ref Range Status   Specimen Description BLOOD RIGHT ANTECUBITAL  Final   Special Requests   Final    AEROBIC BOTTLE ONLY Blood Culture results may not be optimal due to an excessive  volume of blood received in culture bottles   Culture  Setup Time   Final    AEROBIC BOTTLE ONLY GRAM POSITIVE COCCI CRITICAL VALUE NOTED.  VALUE IS CONSISTENT WITH PREVIOUSLY REPORTED AND CALLED VALUE.    Culture (A)  Final    STAPHYLOCOCCUS AUREUS SUSCEPTIBILITIES PERFORMED ON PREVIOUS CULTURE WITHIN THE LAST 5 DAYS. Performed at [facility], [address] [place], [place] [address]    Report Status 11/20/2018 FINAL  Final  Culture, blood (routine x 2)     Status: Abnormal   Collection Time: 11/19/18 11:31 AM   Specimen: BLOOD RIGHT HAND  Result Value Ref Range Status   Specimen Description BLOOD RIGHT HAND  Final   Special Requests AEROBIC BOTTLE ONLY Blood Culture adequate volume  Final   Culture  Setup Time   Final    AEROBIC BOTTLE ONLY GRAM POSITIVE COCCI CRITICAL VALUE NOTED.  VALUE IS CONSISTENT WITH PREVIOUSLY REPORTED AND CALLED VALUE.    Culture (A)  Final    STAPHYLOCOCCUS AUREUS SUSCEPTIBILITIES PERFORMED ON PREVIOUS CULTURE WITHIN THE LAST 5 DAYS. Performed at [facility], [address] [place], [place] [address]    Report Status 11/20/2018 FINAL  Final  Culture, blood (Routine X 2) w Reflex to ID Panel     Status: None   Collection Time: 11/20/18  4:09 PM   Specimen: BLOOD LEFT HAND  Result Value Ref Range Status   Specimen Description BLOOD LEFT HAND  Final   Special Requests   Final    BOTTLES DRAWN AEROBIC ONLY Blood Culture adequate volume   Culture    Final    NO GROWTH 5 DAYS Performed at [facility], [address] [place], [place] [address]    Report Status 11/25/2018 FINAL  Final  Culture, blood (Routine X 2) w Reflex to ID Panel     Status: None   Collection Time: 11/20/18  4:09 PM   Specimen: BLOOD LEFT HAND  Result Value Ref Range Status   Specimen Description BLOOD LEFT HAND  Final   Special Requests   Final    BOTTLES DRAWN AEROBIC ONLY Blood Culture adequate volume   Culture   Final    NO GROWTH 5 DAYS Performed at [facility], [address] [place], [place] [address]    Report Status 11/25/2018 FINAL  Final  Novel Coronavirus, NAA (Hosp order, Send-out to Ref Lab; TAT 18-24 hrs     Status: Abnormal   Collection Time: 11/24/18  6:29 PM   Specimen: Nasopharyngeal Swab; Respiratory  Result Value Ref Range Status   SARS-CoV-2, NAA DETECTED (A) NOT DETECTED Final    Comment: RESULT CALLED TO, READ BACK BY AND VERIFIED WITH: S. BLEDSOE, RN AT 1235 ON 11/25/18 BY C. JESSUP, MT. (NOTE)                  Client Requested Flag This nucleic acid amplification test was developed and its performance characteristics determined by [facility]. Nucleic acid amplification tests include PCR and TMA. This test has not been FDA cleared or approved. This test has been authorized by FDA under an Emergency Use Authorization (EUA). This test is only authorized for the duration of time the declaration that circumstances exist justifying the authorization of the emergency use of in vitro diagnostic tests for detection of SARS-CoV-2 virus and/or diagnosis of COVID-19 infection under section 564(b)(1) of the Act, 21 U.S.C. 696EXB-2(W360bbb-3(b) (1), unless the authorization is terminated or  revoked sooner. When diagnostic testing is negative, the possibility of a false negative result should be considered in the context of a patient's recent exposures and the pres ence of clinical signs and symptoms consistent with COVID-19.  An individual without symptoms of COVID- 19 and who is not shedding SARS-CoV-2 virus would expect to have a negative (not detected) result in this assay. Performed At: [facility] [address] [place], [place] [address] Jolene Schimke MD LK:[id number]    Coronavirus Source NASOPHARYNGEAL  Final    Comment: Performed at [facility], [address], [place], [place] [address]  SARS Coronavirus 2 by RT PCR (hospital order, performed in [facility] hospital lab) Nasopharyngeal Nasopharyngeal Swab     Status: None   Collection Time: 11/25/18  2:09 PM   Specimen: Nasopharyngeal Swab  Result Value Ref Range Status   SARS Coronavirus 2 NEGATIVE NEGATIVE Final    Comment: (NOTE) If result is NEGATIVE SARS-CoV-2 target nucleic acids are NOT DETECTED. The SARS-CoV-2 RNA is generally detectable in upper and lower  respiratory specimens during the acute phase of infection. The lowest  concentration of SARS-CoV-2 viral copies this assay can detect is 250  copies / mL. A negative result does not preclude SARS-CoV-2 infection  and should not be used as the sole basis for treatment or other  patient management decisions.  A negative result may occur with  improper specimen collection / handling, submission of specimen other  than nasopharyngeal swab, presence of viral mutation(s) within the  areas targeted by this assay, and inadequate number of viral copies  (<250 copies / mL). A negative result must be combined with clinical  observations, patient history, and epidemiological information. If result is POSITIVE SARS-CoV-2 target nucleic acids are DETECTED. The SARS-CoV-2 RNA is generally detectable in upper and lower  respiratory specimens dur ing the acute phase of infection.  Positive  results are indicative of active infection with SARS-CoV-2.  Clinical  correlation with patient history and other diagnostic information is  necessary to determine patient infection status.   Positive results do  not rule out bacterial infection or co-infection with other viruses. If result is PRESUMPTIVE POSTIVE SARS-CoV-2 nucleic acids MAY BE PRESENT.   A presumptive positive result was obtained on the submitted specimen  and confirmed on repeat testing.  While 2019 novel coronavirus  (SARS-CoV-2) nucleic acids may be present in the submitted sample  additional confirmatory testing may be necessary for epidemiological  and / or clinical management purposes  to differentiate between  SARS-CoV-2 and other Sarbecovirus currently known to infect humans.  If clinically indicated additional testing with an alternate test  methodology [phone]) is advised. The SARS-CoV-2 RNA is generally  detectable in upper and lower respiratory sp ecimens during the acute  phase of infection. The expected result is Negative. Fact Sheet for Patients:  BoilerBrush.com.cy Fact Sheet for Healthcare Providers: https://pope.com/ This test is not yet approved or cleared by the [place] FDA and has been authorized for detection and/or diagnosis of SARS-CoV-2 by FDA under an Emergency Use Authorization (EUA).  This EUA will remain in effect (meaning this test can be used) for the duration of the COVID-19 declaration under Section 564(b)(1) of the Act, 21 U.S.C. section 360bbb-3(b)(1), unless the authorization is terminated or revoked sooner. Performed at [facility], [address], [place], [place] [address]   SARS CORONAVIRUS 2 (TAT 6-24 HRS) Nasopharyngeal Nasopharyngeal Swab     Status: None   Collection Time:  11/26/18 11:31 AM   Specimen: Nasopharyngeal Swab  Result Value Ref Range Status   SARS Coronavirus 2 NEGATIVE NEGATIVE Final    Comment: (NOTE) SARS-CoV-2 target nucleic acids are NOT DETECTED. The SARS-CoV-2 RNA is generally detectable in upper and lower respiratory specimens during the acute phase of infection. Negative  results do not preclude SARS-CoV-2 infection, do not rule out co-infections with other pathogens, and should not be used as the sole basis for treatment or other patient management decisions. Negative results must be combined with clinical observations, patient history, and epidemiological information. The expected result is Negative. Fact Sheet for Patients: HairSlick.no Fact Sheet for Healthcare Providers: quierodirigir.com This test is not yet approved or cleared by the [place] FDA and  has been authorized for detection and/or diagnosis of SARS-CoV-2 by FDA under an Emergency Use Authorization (EUA). This EUA will remain  in effect (meaning this test can be used) for the duration of the COVID-19 declaration under Section 56 4(b)(1) of the Act, 21 U.S.C. section 360bbb-3(b)(1), unless the authorization is terminated or revoked sooner. Performed at [facility], [address] [place], [place] [address]          Radiology Studies: No results found.      Scheduled Meds: . aspirin EC  325 mg Oral Daily  . atorvastatin  20 mg Oral q1800  . carvedilol  12.5 mg Oral BID  . feeding supplement (ENSURE ENLIVE)  237 mL Oral BID BM  . influenza vaccine adjuvanted  0.5 mL Intramuscular Tomorrow-1000  . levothyroxine  50 mcg Oral QAC breakfast  . lidocaine  1 patch Transdermal Q24H  . LORazepam  0.5 mg Oral QHS  . pantoprazole  40 mg Oral BID  . senna-docusate  2 tablet Oral BID  . sodium chloride flush  10-40 mL Intracatheter Q12H  . sodium chloride flush  3 mL Intravenous Q12H  . vitamin B-12  1,000 mcg Oral Daily   Continuous Infusions: . cefTRIAXone (ROCEPHIN)  IV 2 g (11/27/18 0241)  . methocarbamol (ROBAXIN) IV 500 mg (11/17/18 2004)     LOS: 14 days    Time spent: 35 minutes    Ramiro Harvest, MD [facility]  If 7PM-7AM, please contact night-coverage [email or web address] 11/27/2018, 9:46  AM

## 2018-11-27 NOTE — TOC Progression Note (Signed)
Transition of Care (TOC) - Progression Note    Patient Details  Name: Autumn [name] MRN: [id number] Date of Birth: 03/01/1932  Transition of Care (TOC) CM/SW Contact   M , LCSW Phone Number: 11/27/2018, 1:46 PM  Clinical Narrative:   CSW had worked on sending patient to [facility] for discharge today, faxed negative COVID results. CSW received call back from [facility] after that they needed to have one of the test results as a send out test, but they never told CSW. [facility] will need a negative send out test before they can admit patient. CSW met with patient and son, Kurt, at bedside to discuss [facility]' test requirement. CSW presented information, asked patient and son if they would want to try for another SNF to see if she wouldn't have to be tested again, and patient still wants [facility]; is agreeable to be tested again.   CSW updated MD. MD asked CSW to talk to CSW leadership to fight back, that patient shouldn't need to be tested again because she has had numerous negative tests and there was one false positive. CSW spoke with CSW Team Lead about the situation, and Team Lead notified upper management.   Team Lead called CSW back that leadership had reached out to [facility], and the Administrator at [facility] is going off of CMS guidelines that a PCR (send out) test is needed for any admission after there has been a false positive. So, in order to admit to SNF, the patient will need to have a send out test completed. CSW updated MD again and test will be ordered.   CSW to follow.    Expected Discharge Plan: Skilled Nursing Facility Barriers to Discharge: Other (comment)(COVID test)  Expected Discharge Plan and Services Expected Discharge Plan: Skilled Nursing Facility In-house Referral: NA Discharge Planning Services: NA Post Acute Care Choice: Skilled Nursing Facility Living arrangements for the past 2 months: Single Family Home Expected Discharge Date: 11/15/18                                      Social Determinants of Health (SDOH) Interventions    Readmission Risk Interventions No flowsheet data found.  

## 2018-11-28 LAB — CBC WITH DIFFERENTIAL/PLATELET
Abs Immature Granulocytes: 0.42 10*3/uL — ABNORMAL HIGH (ref 0.00–0.07)
Basophils Absolute: 0 10*3/uL (ref 0.0–0.1)
Basophils Relative: 0 %
Eosinophils Absolute: 0.1 10*3/uL (ref 0.0–0.5)
Eosinophils Relative: 1 %
HCT: 22.3 % — ABNORMAL LOW (ref 36.0–46.0)
Hemoglobin: 7.6 g/dL — ABNORMAL LOW (ref 12.0–15.0)
Immature Granulocytes: 4 %
Lymphocytes Relative: 14 %
Lymphs Abs: 1.5 10*3/uL (ref 0.7–4.0)
MCH: 34.1 pg — ABNORMAL HIGH (ref 26.0–34.0)
MCHC: 34.1 g/dL (ref 30.0–36.0)
MCV: 100 fL (ref 80.0–100.0)
Monocytes Absolute: 0.8 10*3/uL (ref 0.1–1.0)
Monocytes Relative: 7 %
Neutro Abs: 8.1 10*3/uL — ABNORMAL HIGH (ref 1.7–7.7)
Neutrophils Relative %: 74 %
Platelets: 195 10*3/uL (ref 150–400)
RBC: 2.23 MIL/uL — ABNORMAL LOW (ref 3.87–5.11)
RDW: 15.1 % (ref 11.5–15.5)
WBC: 11 10*3/uL — ABNORMAL HIGH (ref 4.0–10.5)
nRBC: 0.2 % (ref 0.0–0.2)

## 2018-11-28 LAB — BASIC METABOLIC PANEL
Anion gap: 8 (ref 5–15)
BUN: 17 mg/dL (ref 8–23)
CO2: 23 mmol/L (ref 22–32)
Calcium: 7.8 mg/dL — ABNORMAL LOW (ref 8.9–10.3)
Chloride: 105 mmol/L (ref 98–111)
Creatinine, Ser: 1.16 mg/dL — ABNORMAL HIGH (ref 0.44–1.00)
GFR calc Af Amer: 50 mL/min — ABNORMAL LOW (ref >60)
GFR calc non Af Amer: 43 mL/min — ABNORMAL LOW (ref >60)
Glucose, Bld: 119 mg/dL — ABNORMAL HIGH (ref 70–99)
Potassium: 3.3 mmol/L — ABNORMAL LOW (ref 3.5–5.1)
Sodium: 136 mmol/L (ref 135–145)

## 2018-11-28 LAB — MAGNESIUM: Magnesium: 1.8 mg/dL (ref 1.7–2.4)

## 2018-11-28 MED ORDER — ENSURE ENLIVE PO LIQD
237.0000 mL | Freq: Three times a day (TID) | ORAL | Status: DC
  Administered 2018-11-28: 237 mL via ORAL

## 2018-11-28 MED ORDER — ENSURE ENLIVE PO LIQD: 237.0000 mL | Freq: Three times a day (TID) | ORAL | 0 refills | Status: DC

## 2018-11-28 MED ORDER — MAGNESIUM SULFATE 2 GM/50ML IV SOLN
2.0000 g | Freq: Once | INTRAVENOUS | Status: AC
  Administered 2018-11-28: 2 g via INTRAVENOUS
  Filled 2018-11-28: qty 50

## 2018-11-28 MED ORDER — POTASSIUM CHLORIDE CRYS ER 20 MEQ PO TBCR
40.0000 meq | EXTENDED_RELEASE_TABLET | Freq: Once | ORAL | Status: AC
  Administered 2018-11-28: 40 meq via ORAL
  Filled 2018-11-28: qty 2

## 2018-11-28 NOTE — TOC Transition Note (Signed)
Transition of Care [facility]) - CM/SW Discharge Note   Patient Details  Name: Autumn [name] MRN: [id number] Date of Birth: Jun 15, 1932  [facility] CM/SW Contact:  Geralynn Ochs, LCSW Phone Number: 11/28/2018, 2:53 PM   Clinical Narrative:   Nurse to call report to [phone] room 306.    Final next level of care: Skilled Nursing Facility Barriers to Discharge: Barriers Resolved   Patient Goals and CMS Choice Patient states their goals for this hospitalization and ongoing recovery are:: " to go to rehab" CMS Medicare.gov Compare Post Acute Care list provided to:: Patient Choice offered to / list presented to : Patient  Discharge Placement              Patient chooses bed at: [facility], [place] Patient to be transferred to facility by: [facility] Name of family member notified: self and kris Patient and family notified of of transfer: 11/28/18  Discharge Plan and Services In-house Referral: NA Discharge Planning Services: NA Post Acute Care Choice: [place]                               Social Determinants of Health (SDOH) Interventions     Readmission Risk Interventions No flowsheet data found.

## 2018-11-28 NOTE — Progress Notes (Signed)
Nutrition Follow-up  DOCUMENTATION CODES:   Not applicable  INTERVENTION:   - Increase Ensure Enlive po to TID, each supplement provides 350 kcal and 20 grams of protein  - Magic cup TID with meals, each supplement provides 290 kcal and 9 grams of protein  - Encourage adequate PO intake  NUTRITION DIAGNOSIS:   Inadequate oral intake related to acute illness as evidenced by meal completion < 50%.  Ongoing, being addressed via oral nutrition supplements  GOAL:   Patient will meet greater than or equal to 90% of their needs  Progressing  MONITOR:   PO intake, Supplement acceptance, Labs, Weight trends, Skin, I & O's  REASON FOR ASSESSMENT:   Consult Assessment of nutrition requirement/status  ASSESSMENT:   85 y.o. female with past medical history significant for HTN, LBBB, and GERD who presents to the ED via EMS from [facility] center for acute onset neck and back pain as well as vomiting. In ED became orthostatic with 3 minutes of standing and found to have elevated troponing levels. Pt found to have bilateral hydronephrosis  Spoke with RN who reports plan is for pt to d/c to SNF today. PICC placed yesterday. Palliative to follow pt at d/c.  Pt accepting most Ensure supplements per [facility] documentation. RD will increase order to TID. Will also order Magic Cups with meals.  Weight trending up since admission. Overall, weight is up 22 lbs. Per RN edema assessment, pt with mild pitting edema to LUE and non-pitting edema to LLE.  Meal Completion: no documentation since 10/12  Medications reviewed and include: Ensure Enlive BID, Protonix, Senna, vitamin B-12, IV abx  Labs reviewed: potassium 3.3, hemoglobin 7.6  UOP: 1650 ml x 24 hours  Diet Order:   Diet Order            Diet - low sodium heart healthy        Diet regular Room service appropriate? Yes; Fluid consistency: Thin  Diet effective now              EDUCATION NEEDS:   No education needs  have been identified at this time  Skin:  Skin Assessment: Reviewed RN Assessment  Last BM:  11/25/18  Height:   Ht Readings from Last 1 Encounters:  11/20/18 5\' 3"  (1.6 m)    Weight:   Wt Readings from Last 1 Encounters:  11/28/18 75.4 kg    Ideal Body Weight:  56.3 kg  BMI:  Body mass index is 29.45 kg/m.  Estimated Nutritional Needs:   Kcal:  1650-1850  Protein:  80-90 grams  Fluid:  >/= 1.7 L/day    Gaynell Face, MS, RD, LDN Inpatient Clinical Dietitian Pager: [phone] Weekend/After Hours: [phone]

## 2018-11-28 NOTE — TOC Progression Note (Signed)
Transition of Care [facility]) - Progression Note    Patient Details  Name: Autumn [name] MRN: [id number] Date of Birth: 26-Mar-1932  Transition of Care [facility]) CM/SW [place], [place] Phone Number: 11/28/2018, 3:00 PM  Clinical Narrative:   CSW received call from MD that he spoke with ID and both tests completed for patient were PCR tests. CSW updated CSW Team Lead who coordinated with [facility] Administration. [facility] Administration is willing to accept both tests if the documentation for the second test can be updated to indicate that it was a PCR test; there is no documentation that it was a PCR Test. CSW updated MD who provided number to reach out to ID MD. CSW Team Lead to speak with ID MD about updating documentation. CSW to follow.    Expected Discharge Plan: Skilled Nursing Facility Barriers to Discharge: Barriers Resolved  Expected Discharge Plan and Services Expected Discharge Plan: [place] In-house Referral: NA Discharge Planning Services: NA Post Acute Care Choice: [place] Living arrangements for the past 2 months: Single Family Home Expected Discharge Date: 11/28/18                                     Social Determinants of Health (SDOH) Interventions    Readmission Risk Interventions No flowsheet data found.

## 2018-11-28 NOTE — Progress Notes (Signed)
Patient being discharged to [facility], transportation provided via [facility]. Belongings returned to patient. Report given to [facility] RN.  Rian Koon A [place]

## 2018-11-28 NOTE — Progress Notes (Signed)
Hgb 7.6 trend down from 9.8 11/26/2018, asymptomatic. Dr. Baltazar Najjar informed.

## 2018-11-28 NOTE — Progress Notes (Signed)
Dr. Baltazar Najjar informed of pt's potassium 3.3

## 2018-11-28 NOTE — Progress Notes (Signed)
PROGRESS NOTE    Autumn [name]  ZOX:[id number] DOB: [birth year] DOA: 11/12/2018 PCP: Merlene Laughter, MD    Brief Narrative:  Autumn [name] a 85 y.o.femalewith medical history significant ofhypertension, hypothyroidism,LBBB,arthritis,andGERD;who initially  presented with complaints of nausea and vomiting.Hospital course complicated by elevated troponin, abnormal 2D echo with LVEF 25%, small to moderate pericardial effusion. Seen by cardiology and patient declined aggressive cardiac work-up per cardiology. Blood cultures also showed MSSA . ID consulted.  TEE showed posterior mitral valve echodensity suspicious for vegetation.  Started on ceftriaxone.  Patient underwent CT head which revealed left acute/subacute cerebellar ischemic infarct. Neurology consulted and was following.  Hospital course also remarkable for false positive Covid test.  Covid test repeated on the same day resulted negative.  ID recommended to continue ceftriaxone through 12/31/18.  She has been seen by PT and OT and recommended skilled nursing facility on discharge.     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  Cerebellar hemisphere extending to the left cerebellar peduncle.  Further work-up with MRI revealed large acute left cerebellar infarct without hemorrhage or mass-effect.  TEE done concerning for endocarditis.  Patient maintained on aspirin and Lipitor.  Neurology assessed the patient will follow the patient throughout the hospitalization.  Patient was seen by PT OT.  Will need outpatient follow-up with neurology in 4 weeks.  Neurology signed off 11/20/2018.  2.  Sepsis secondary to MSSA bacteremia/endocarditis Patient seen in consultation by ID and followed the patient throughout the hospitalization.  TEE which was done on 11/20/2018 revealed posterior mitral valve echodensity suspicious for vegetation.  CT surgery was consulted and was felt patient was a nonsurgical candidate in light of endocarditis and acute CVA with concerns for transformation hemorrhagic CVA.  Repeat blood cultures done negative.  PICC line ordered and placed.  Due to worsening renal function IV antibiotics were changed to IV Rocephin per ID.  Outpatient follow-up with ID.  3.  Acute kidney injury/urinary retention/bilateral hydronephrosis Patient noted to have a creatinine go up as high as 1.51 on 11/22/2018.  Patient noted to have a documented episode of hypotension around 11:30 PM with blood pressure of 84/42.  Blood pressure improved.  Fractional excretion of sodium was 0.42% pointing towards a prerenal etiology.  Renal ultrasound which was done revealed bilateral moderate hydronephrosis.  Patient also noted to have some urinary retention.  Patient has required I and O cath x3.  Foley catheter ordered but not placed initially on 11/26/2018.  Foley catheter was reordered and placed on 11/27/2018 with clinical improvement and good urine output.  Patient be discharged with Foley  catheter and will likely need a voiding trial in about 5 to 6  days with outpatient follow-up with urology.  May need repeat renal ultrasound to follow-up on bilateral hydronephrosis.   4.  Intractable nausea and abdominal pain, unclear etiology Questionable etiology.  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  Patient seen in consultation by vascular surgery which feels this was not the likely etiology of patient's nausea and abdominal pain.  Patient did report NSAID use and ibuprofen for months with a 5 pound unintentional weight loss over the past 4 to 8 weeks.  GI was consulted to assess patient however patient declined EGD.  NSAIDs have been discontinued.  GI recommended Protonix 40 mg twice daily x6 weeks and then 40 mg daily.  Outpatient follow-up with GI.  GI signed off 11/14/2018.  5.  Intractable lower back pain in the setting of chronic back pain likely multifactorial secondary to severe spinal stenosis at L3-4 and MSSA bacteremia. MRI of the T-spine L-spine and L-spine was done on 11/18/2018 that showed severe stenosis at L3-L4 with disc bulge and central disc protrusion at T7-T8.  Patient assessed by neurosurgery who feel no intervention is needed at this time.  PT/OT.  Continue current pain management.  Outpatient follow-up.  6.  Acute on chronic combined systolic and diastolic CHF/small-to-moderate pericardial effusion Patient seen by Dr. Percival Spanish of cardiology and per review of medical records noted to have an EF of 35% on 04/10/2014.  Patient had a left heart cath done September 02, 2013 with history of nonischemic cardiomyopathy. Repeat 2D echo done during this hospitalization on 11/13/2018 showed a EF of 25% with diffuse hypokinesis and septal lateral dyssynchrony consistent with chronic left bundle branch block as well as grade 1 diastolic dysfunction.  Cardiology was consulted and patient declined aggressive cardiac work-up.  Outpatient follow-up with cardiology APP in 2 weeks and with Dr. Percival Spanish in 1 to 2 months.  Continue current regimen of carvedilol 12.5 mg twice daily, losartan 100 mg daily, Lasix 20 mg as needed on discharge.  Patient will likely need repeat 2D echo in 3 to 6 months.  Cardiology signed off on 11/15/2018.  Patient currently stable.  Outpatient follow-up with cardiology.  7.  Generalized weakness with physical debility likely in the setting of acute illness PT/OT.  Awaiting SNF placement.  8.  False-positive COVID-19 False-positive COVID-19 test with repeat Hologic COVID-19 test negative.  ID reviewed patient's chart and ID feels lab course test was a false positive likely with low cycle number. PCR with test now negative x2.  Airborne/contact precautions has been discontinued per ID.  No further repeat testing needed at this time per ID.Marland Kitchen  9.  Hypertension complicated by orthostatic hypotension. Diuretics held due to orthostatic hypotension.  Patient currently euvolemic.  BNP 65 on 11/12/2018.  Continue current regimen of Coreg and Cozaar on discharge.  Follow.  10.  Hyperlipidemia Continue statin.  11.  Chronic euvolemic hyponatremia Currently asymptomatic.  Stable.  12.  Hypothyroidism Synthroid.  Outpatient follow-up.   13.  Gastroesophageal reflux disease Continue PPI.  14.  Acute superficial vein thrombosis involving the left cephalic vein Continue supportive care.    DVT prophylaxis: SCDs Code Status: DNR Family Communication: Updated patient.  No family at bedside. Disposition Plan: Patient medically stable to be discharged to skilled nursing facility when bed available.  No change in current medical status since 11/26/2018.    Consultants:   Cardiology: Dr. Domenic Polite 11/12/2018  Vascular surgery:  Dr. Myra Gianotti 11/13/2018  Gastroenterology: Dr. Barron Alvine 11/14/2018  Neurosurgery: Dr. Venetia Maxon 11/19/2018  Infectious disease: Dr. Daiva Eves 11/19/2018  Neurology: Dr.Aroor 11/19/2018  Cardiothoracic surgery: Dr. Cliffton Asters 12/03/2018  Palliative care: Norvel Richards, PA 11/23/2018  Procedures:   TEE 11/20/2018  2D echo 11/19/2018, 11/13/2018  Upper extremity Dopplers 11/19/2018  MRI brain 11/20/2018  MRI T-spine/L-spine/L-spine 11/18/2018  CT head 11/19/2018  CT angiogram head and neck 11/20/2018  CT abdomen and pelvis 11/17/2018  CT angiogram chest abdomen and pelvis 11/12/2018  Chest x-ray 11/12/2018  PICC line 11/27/2018  Antimicrobials:   IV Rocephin 11/22/2018  IV vancomycin 11/19/2018>>>> 11/20/2018  IV nafcillin 11/20/2018>>>>> 11/22/2018     Subjective: Patient laying in bed.  States suprapubic abdominal pain improved after Foley catheter was placed.  Denies any chest pain no shortness of breath.  Feeling better.   Objective: Vitals:   11/28/18 0024 11/28/18 0422 11/28/18 0521 11/28/18 0821  BP: 128/72 125/62  (!) 147/64  Pulse: 75 74  75  Resp: 17 16  18   Temp: 98.6 F (37 C) 98.8 F (37.1 C)  98.7 F (37.1 C)  TempSrc: Oral Oral  Oral  SpO2: 96% 94%  93%  Weight:   75.4 kg   Height:        Intake/Output Summary (Last 24 hours) at 11/28/2018 1043 Last data filed at 11/28/2018 0533 Gross per 24 hour  Intake 360.93 ml  Output 1650 ml  Net -1289.07 ml   Filed Weights   11/26/18 0357 11/27/18 0242 11/28/18 0521  Weight: 75.8 kg 77.4 kg 75.4 kg    Examination:  General exam: NAD Respiratory system: Clear to auscultation bilaterally anterior lung fields.  No wheezing, no crackles, no rhonchi.  Cardiovascular system: Regular rate rhythm no murmurs rubs or gallops.  No JVD.  No lower extremity edema. Gastrointestinal system: Abdomen is soft, nontender, nondistended, positive bowel sounds.  No rebound.  No guarding. Central nervous system: Alert and oriented. No focal neurological deficits. Extremities: Symmetric 5 x 5 power. Skin: No rashes, lesions or ulcers Psychiatry: Judgement and insight appear normal. Mood & affect appropriate.     Data Reviewed: I have personally reviewed following labs and  imaging studies  CBC: Recent Labs  Lab 11/23/18 0343 11/24/18 0353 11/25/18 0432 11/26/18 0328 11/28/18 0500  WBC 18.5* 14.0* 17.0* 15.6* 11.0*  NEUTROABS  --   --   --   --  8.1*  HGB 10.6* 9.4* 9.7* 9.8* 7.6*  HCT 29.9* 25.8* 28.2* 28.1* 22.3*  MCV 96.5 95.9 96.9 95.6 100.0  PLT 128* 118* 154 183 195   Basic Metabolic Panel: Recent Labs  Lab 11/23/18 0343 11/24/18 0353 11/25/18 0432 11/26/18 0328 11/28/18 0500  NA 133* 133* 135 135 136  K 3.8 2.9* 4.2 3.9 3.3*  CL 101 103 108 104 105  CO2 18* 19* 19* 19* 23  GLUCOSE 136* 141* 140* 119* 119*  BUN 56* 42* 29* 24* 17  CREATININE 2.30* 1.90* 1.54* 1.33* 1.16*  CALCIUM 7.6* 7.6* 7.9* 8.3* 7.8*  MG  --   --   --   --  1.8   GFR: Estimated Creatinine Clearance: 35.1 mL/min (A) (by C-G formula based on SCr of 1.16 mg/dL (H)). Liver Function Tests: No results for input(s): AST, ALT, ALKPHOS, BILITOT, PROT, ALBUMIN in the last 168 hours. No results for input(s): LIPASE, AMYLASE in the last 168 hours. No results for input(s): AMMONIA in the last 168 hours. Coagulation Profile: No results for input(s): INR, PROTIME in  the last 168 hours. Cardiac Enzymes: No results for input(s): CKTOTAL, CKMB, CKMBINDEX, TROPONINI in the last 168 hours. BNP (last 3 results) No results for input(s): PROBNP in the last 8760 hours. HbA1C: No results for input(s): HGBA1C in the last 72 hours. CBG: No results for input(s): GLUCAP in the last 168 hours. Lipid Profile: No results for input(s): CHOL, HDL, LDLCALC, TRIG, CHOLHDL, LDLDIRECT in the last 72 hours. Thyroid Function Tests: No results for input(s): TSH, T4TOTAL, FREET4, T3FREE, THYROIDAB in the last 72 hours. Anemia Panel: No results for input(s): VITAMINB12, FOLATE, FERRITIN, TIBC, IRON, RETICCTPCT in the last 72 hours. Sepsis Labs: No results for input(s): PROCALCITON, LATICACIDVEN in the last 168 hours.  Recent Results (from the past 240 hour(s))  MRSA PCR Screening     Status:  None   Collection Time: 11/19/18 10:48 AM   Specimen: Nasal Mucosa; Nasopharyngeal  Result Value Ref Range Status   MRSA by PCR NEGATIVE NEGATIVE Final    Comment:        The GeneXpert MRSA Assay (FDA approved for NASAL specimens only), is one component of a comprehensive MRSA colonization surveillance program. It is not intended to diagnose MRSA infection nor to guide or monitor treatment for MRSA infections. Performed at [facility], [address] [place], [place] [address]   Culture, blood (routine x 2)     Status: Abnormal   Collection Time: 11/19/18 11:22 AM   Specimen: BLOOD  Result Value Ref Range Status   Specimen Description BLOOD RIGHT ANTECUBITAL  Final   Special Requests   Final    AEROBIC BOTTLE ONLY Blood Culture results may not be optimal due to an excessive volume of blood received in culture bottles   Culture  Setup Time   Final    AEROBIC BOTTLE ONLY GRAM POSITIVE COCCI CRITICAL VALUE NOTED.  VALUE IS CONSISTENT WITH PREVIOUSLY REPORTED AND CALLED VALUE.    Culture (A)  Final    STAPHYLOCOCCUS AUREUS SUSCEPTIBILITIES PERFORMED ON PREVIOUS CULTURE WITHIN THE LAST 5 DAYS. Performed at [facility], [address] [place], [place] [address]    Report Status 11/20/2018 FINAL  Final  Culture, blood (routine x 2)     Status: Abnormal   Collection Time: 11/19/18 11:31 AM   Specimen: BLOOD RIGHT HAND  Result Value Ref Range Status   Specimen Description BLOOD RIGHT HAND  Final   Special Requests AEROBIC BOTTLE ONLY Blood Culture adequate volume  Final   Culture  Setup Time   Final    AEROBIC BOTTLE ONLY GRAM POSITIVE COCCI CRITICAL VALUE NOTED.  VALUE IS CONSISTENT WITH PREVIOUSLY REPORTED AND CALLED VALUE.    Culture (A)  Final    STAPHYLOCOCCUS AUREUS SUSCEPTIBILITIES PERFORMED ON PREVIOUS CULTURE WITHIN THE LAST 5 DAYS. Performed at [facility], [address] [place], [place] [address]    Report Status 11/20/2018 FINAL  Final   Culture, blood (Routine X 2) w Reflex to ID Panel     Status: None   Collection Time: 11/20/18  4:09 PM   Specimen: BLOOD LEFT HAND  Result Value Ref Range Status   Specimen Description BLOOD LEFT HAND  Final   Special Requests   Final    BOTTLES DRAWN AEROBIC ONLY Blood Culture adequate volume   Culture   Final    NO GROWTH 5 DAYS Performed at [facility], [address] [place], [place] [address]    Report Status 11/25/2018 FINAL  Final  Culture, blood (Routine X 2) w Reflex to ID Panel     Status: None   Collection Time: 11/20/18  4:09 PM   Specimen: BLOOD LEFT HAND  Result Value Ref Range Status   Specimen Description BLOOD LEFT HAND  Final   Special Requests   Final    BOTTLES DRAWN AEROBIC ONLY Blood Culture adequate volume   Culture   Final    NO GROWTH 5 DAYS Performed at [facility], [address], [place], [place] [address]    Report Status 11/25/2018 FINAL  Final  Novel Coronavirus, NAA (Hosp order, Send-out to Ref Lab; TAT 18-24 hrs     Status: Abnormal   Collection Time: 11/24/18  6:29 PM   Specimen: Nasopharyngeal Swab; Respiratory  Result Value Ref Range Status   SARS-CoV-2, NAA DETECTED (A) NOT DETECTED Final    Comment: RESULT CALLED TO, READ BACK BY AND VERIFIED WITH: S. BLEDSOE, RN AT 1235 ON 11/25/18 BY C. JESSUP, MT. (NOTE)                  Client Requested Flag This nucleic acid amplification test was developed and its performance characteristics determined by [employer]. Nucleic acid amplification tests include PCR and TMA. This test has not been FDA cleared or approved. This test has been authorized by FDA under an Emergency Use Authorization (EUA). This test is only authorized for the duration of time the declaration that circumstances exist justifying the authorization of the emergency use of in vitro diagnostic tests for detection of SARS-CoV-2 virus and/or diagnosis of COVID-19 infection under section 564(b)(1) of the  Act, 21 U.S.C. 492EFE-0(F) (1), unless the authorization is terminated or revoked sooner. When diagnostic testing is negative, the possibility of a false negative result should be considered in the context of a patient's recent exposures and the pres ence of clinical signs and symptoms consistent with COVID-19. An individual without symptoms of COVID- 19 and who is not shedding SARS-CoV-2 virus would expect to have a negative (not detected) result in this assay. Performed At: [facility] [address] [place], [place] [address] Jolene Schimke MD GP:[id number]    Coronavirus Source NASOPHARYNGEAL  Final    Comment: Performed at [facility], [address], [place], [place] [address]  SARS Coronavirus 2 by RT PCR (hospital order, performed in [facility] hospital lab) Nasopharyngeal Nasopharyngeal Swab     Status: None   Collection Time: 11/25/18  2:09 PM   Specimen: Nasopharyngeal Swab  Result Value Ref Range Status   SARS Coronavirus 2 NEGATIVE NEGATIVE Final    Comment: (NOTE) If result is NEGATIVE SARS-CoV-2 target nucleic acids are NOT DETECTED. The SARS-CoV-2 RNA is generally detectable in upper and lower  respiratory specimens during the acute phase of infection. The lowest  concentration of SARS-CoV-2 viral copies this assay can detect is 250  copies / mL. A negative result does not preclude SARS-CoV-2 infection  and should not be used as the sole basis for treatment or other  patient management decisions.  A negative result may occur with  improper specimen collection / handling, submission of specimen other  than nasopharyngeal swab, presence of viral mutation(s) within the  areas targeted by this assay, and inadequate number of viral copies  (<250 copies / mL). A negative result must be combined with clinical  observations, patient history, and epidemiological information. If result is POSITIVE SARS-CoV-2 target nucleic acids are DETECTED. The  SARS-CoV-2 RNA is generally detectable in  upper and lower  respiratory specimens dur ing the acute phase of infection.  Positive  results are indicative of active infection with SARS-CoV-2.  Clinical  correlation with patient history and other diagnostic information is  necessary to determine patient infection status.  Positive results do  not rule out bacterial infection or co-infection with other viruses. If result is PRESUMPTIVE POSTIVE SARS-CoV-2 nucleic acids MAY BE PRESENT.   A presumptive positive result was obtained on the submitted specimen  and confirmed on repeat testing.  While 2019 novel coronavirus  (SARS-CoV-2) nucleic acids may be present in the submitted sample  additional confirmatory testing may be necessary for epidemiological  and / or clinical management purposes  to differentiate between  SARS-CoV-2 and other Sarbecovirus currently known to infect humans.  If clinically indicated additional testing with an alternate test  methodology [phone]([id number]) is advised. The SARS-CoV-2 RNA is generally  detectable in upper and lower respiratory sp ecimens during the acute  phase of infection. The expected result is Negative. Fact Sheet for Patients:  BoilerBrush.com.cyhttps://www.fda.gov/media/136312/download Fact Sheet for Healthcare Providers: https://pope.com/https://www.fda.gov/media/136313/download This test is not yet approved or cleared by the [place] FDA and has been authorized for detection and/or diagnosis of SARS-CoV-2 by FDA under an Emergency Use Authorization (EUA).  This EUA will remain in effect (meaning this test can be used) for the duration of the COVID-19 declaration under Section 564(b)(1) of the Act, 21 U.S.C. section 360bbb-3(b)(1), unless the authorization is terminated or revoked sooner. Performed at [facility], [address] [place], [place] [address]   SARS CORONAVIRUS 2 (TAT 6-24 HRS) Nasopharyngeal Nasopharyngeal Swab     Status: None   Collection Time: 11/26/18  11:31 AM   Specimen: Nasopharyngeal Swab  Result Value Ref Range Status   SARS Coronavirus 2 NEGATIVE NEGATIVE Final    Comment: (NOTE) SARS-CoV-2 target nucleic acids are NOT DETECTED. The SARS-CoV-2 RNA is generally detectable in upper and lower respiratory specimens during the acute phase of infection. Negative results do not preclude SARS-CoV-2 infection, do not rule out co-infections with other pathogens, and should not be used as the sole basis for treatment or other patient management decisions. Negative results must be combined with clinical observations, patient history, and epidemiological information. The expected result is Negative. Fact Sheet for Patients: HairSlick.nohttps://www.fda.gov/media/138098/download Fact Sheet for Healthcare Providers: quierodirigir.comhttps://www.fda.gov/media/138095/download This test is not yet approved or cleared by the [place] FDA and  has been authorized for detection and/or diagnosis of SARS-CoV-2 by FDA under an Emergency Use Authorization (EUA). This EUA will remain  in effect (meaning this test can be used) for the duration of the COVID-19 declaration under Section 56 4(b)(1) of the Act, 21 U.S.C. section 360bbb-3(b)(1), unless the authorization is terminated or revoked sooner. Performed at [facility], [address] [place], [place] [address]          Radiology Studies: No results found.      Scheduled Meds: . aspirin EC  325 mg Oral Daily  . atorvastatin  20 mg Oral q1800  . carvedilol  12.5 mg Oral BID  . feeding supplement (ENSURE ENLIVE)  237 mL Oral BID BM  . influenza vaccine adjuvanted  0.5 mL Intramuscular Tomorrow-1000  . levothyroxine  50 mcg Oral QAC breakfast  . lidocaine  1 patch Transdermal Q24H  . LORazepam  0.5 mg Oral QHS  . pantoprazole  40 mg Oral BID  . potassium chloride  40 mEq Oral Once  . senna-docusate  2  tablet Oral BID  . sodium chloride flush  10-40 mL Intracatheter Q12H  . sodium chloride flush  3  mL Intravenous Q12H  . vitamin B-12  1,000 mcg Oral Daily   Continuous Infusions: . cefTRIAXone (ROCEPHIN)  IV 2 g (11/28/18 0240)  . magnesium sulfate bolus IVPB    . methocarbamol (ROBAXIN) IV 500 mg (11/17/18 2004)     LOS: 15 days    Time spent: 35 minutes    Ramiro Harvest, MD [facility]  If 7PM-7AM, please contact night-coverage [email or web address] 11/28/2018, 10:43 AM

## 2018-11-28 NOTE — TOC Progression Note (Signed)
Transition of Care [facility]) - Progression Note    Patient Details  Name: Autumn [name] MRN: [id number] Date of Birth: 1932-08-27  Transition of Care [facility]) CM/SW [place], [place] Phone Number: 11/28/2018, 3:04 PM  Clinical Narrative:  CSW worked throughout the day communicating between MD, [place] Team Lead, and [facility] Admissions. Documentation was updated on patient's second negative COVID test to indicate that it was a PCR Test, and CSW faxed to [facility]. They reviewed and they are able to accept the patient's negative COVID Tests without needing another test, they can admit her today. CSW updated MD.     Expected Discharge Plan: [place] Barriers to Discharge: Barriers Resolved  Expected Discharge Plan and Services Expected Discharge Plan: [place] In-house Referral: NA Discharge Planning Services: NA Post Acute Care Choice: [place] Living arrangements for the past 2 months: Single Family Home Expected Discharge Date: 11/28/18                                     Social Determinants of Health (SDOH) Interventions    Readmission Risk Interventions No flowsheet data found.

## 2018-11-29 LAB — SARS CORONAVIRUS 2 (TAT 6-24 HRS): SARS Coronavirus 2: NEGATIVE

## 2018-12-15 DIAGNOSIS — A419 Sepsis, unspecified organism: Secondary | ICD-10-CM

## 2018-12-15 HISTORY — DX: Sepsis, unspecified organism: A41.9

## 2018-12-18 ENCOUNTER — Emergency Department (HOSPITAL_COMMUNITY): Payer: Medicare Other

## 2018-12-18 ENCOUNTER — Encounter (HOSPITAL_COMMUNITY): Payer: Self-pay | Admitting: Emergency Medicine

## 2018-12-18 ENCOUNTER — Inpatient Hospital Stay (HOSPITAL_COMMUNITY)
Admission: EM | Admit: 2018-12-18 | Discharge: 2018-12-31 | DRG: 871 | Disposition: A | Discharge status: Hospice | Payer: Medicare Other | Attending: Family Medicine | Admitting: Family Medicine

## 2018-12-18 ENCOUNTER — Ambulatory Visit: Payer: Medicare Other | Admitting: Infectious Disease

## 2018-12-18 DIAGNOSIS — A021 Salmonella sepsis: Secondary | ICD-10-CM | POA: Diagnosis not present

## 2018-12-18 DIAGNOSIS — I447 Left bundle-branch block, unspecified: Secondary | ICD-10-CM | POA: Diagnosis present

## 2018-12-18 DIAGNOSIS — Z882 Allergy status to sulfonamides status: Secondary | ICD-10-CM

## 2018-12-18 DIAGNOSIS — E039 Hypothyroidism, unspecified: Secondary | ICD-10-CM | POA: Diagnosis present

## 2018-12-18 DIAGNOSIS — L89616 Pressure-induced deep tissue damage of right heel: Secondary | ICD-10-CM | POA: Diagnosis present

## 2018-12-18 DIAGNOSIS — D539 Nutritional anemia, unspecified: Secondary | ICD-10-CM | POA: Diagnosis present

## 2018-12-18 DIAGNOSIS — Z888 Allergy status to other drugs, medicaments and biological substances status: Secondary | ICD-10-CM

## 2018-12-18 DIAGNOSIS — I5043 Acute on chronic combined systolic (congestive) and diastolic (congestive) heart failure: Secondary | ICD-10-CM | POA: Diagnosis present

## 2018-12-18 DIAGNOSIS — J189 Pneumonia, unspecified organism: Secondary | ICD-10-CM | POA: Diagnosis present

## 2018-12-18 DIAGNOSIS — N179 Acute kidney failure, unspecified: Secondary | ICD-10-CM | POA: Diagnosis not present

## 2018-12-18 DIAGNOSIS — R918 Other nonspecific abnormal finding of lung field: Secondary | ICD-10-CM | POA: Diagnosis not present

## 2018-12-18 DIAGNOSIS — G629 Polyneuropathy, unspecified: Secondary | ICD-10-CM | POA: Diagnosis present

## 2018-12-18 DIAGNOSIS — G7281 Critical illness myopathy: Secondary | ICD-10-CM | POA: Diagnosis not present

## 2018-12-18 DIAGNOSIS — R0600 Dyspnea, unspecified: Secondary | ICD-10-CM

## 2018-12-18 DIAGNOSIS — B9561 Methicillin susceptible Staphylococcus aureus infection as the cause of diseases classified elsewhere: Secondary | ICD-10-CM | POA: Diagnosis not present

## 2018-12-18 DIAGNOSIS — M4626 Osteomyelitis of vertebra, lumbar region: Secondary | ICD-10-CM | POA: Diagnosis present

## 2018-12-18 DIAGNOSIS — D638 Anemia in other chronic diseases classified elsewhere: Secondary | ICD-10-CM | POA: Diagnosis present

## 2018-12-18 DIAGNOSIS — A419 Sepsis, unspecified organism: Secondary | ICD-10-CM | POA: Diagnosis not present

## 2018-12-18 DIAGNOSIS — Z7189 Other specified counseling: Secondary | ICD-10-CM | POA: Diagnosis not present

## 2018-12-18 DIAGNOSIS — G8929 Other chronic pain: Secondary | ICD-10-CM | POA: Diagnosis present

## 2018-12-18 DIAGNOSIS — J9601 Acute respiratory failure with hypoxia: Secondary | ICD-10-CM | POA: Diagnosis present

## 2018-12-18 DIAGNOSIS — R410 Disorientation, unspecified: Secondary | ICD-10-CM | POA: Diagnosis present

## 2018-12-18 DIAGNOSIS — K6812 Psoas muscle abscess: Secondary | ICD-10-CM | POA: Diagnosis present

## 2018-12-18 DIAGNOSIS — G062 Extradural and subdural abscess, unspecified: Secondary | ICD-10-CM | POA: Diagnosis not present

## 2018-12-18 DIAGNOSIS — I340 Nonrheumatic mitral (valve) insufficiency: Secondary | ICD-10-CM | POA: Diagnosis not present

## 2018-12-18 DIAGNOSIS — I2690 Septic pulmonary embolism without acute cor pulmonale: Secondary | ICD-10-CM

## 2018-12-18 DIAGNOSIS — G061 Intraspinal abscess and granuloma: Secondary | ICD-10-CM | POA: Diagnosis not present

## 2018-12-18 DIAGNOSIS — M4640 Discitis, unspecified, site unspecified: Secondary | ICD-10-CM | POA: Diagnosis not present

## 2018-12-18 DIAGNOSIS — M8580 Other specified disorders of bone density and structure, unspecified site: Secondary | ICD-10-CM | POA: Diagnosis present

## 2018-12-18 DIAGNOSIS — L8990 Pressure ulcer of unspecified site, unspecified stage: Secondary | ICD-10-CM | POA: Insufficient documentation

## 2018-12-18 DIAGNOSIS — Z9861 Coronary angioplasty status: Secondary | ICD-10-CM

## 2018-12-18 DIAGNOSIS — K219 Gastro-esophageal reflux disease without esophagitis: Secondary | ICD-10-CM | POA: Diagnosis present

## 2018-12-18 DIAGNOSIS — Z7982 Long term (current) use of aspirin: Secondary | ICD-10-CM

## 2018-12-18 DIAGNOSIS — F419 Anxiety disorder, unspecified: Secondary | ICD-10-CM | POA: Diagnosis present

## 2018-12-18 DIAGNOSIS — Z515 Encounter for palliative care: Secondary | ICD-10-CM | POA: Diagnosis present

## 2018-12-18 DIAGNOSIS — R0682 Tachypnea, not elsewhere classified: Secondary | ICD-10-CM

## 2018-12-18 DIAGNOSIS — Z8249 Family history of ischemic heart disease and other diseases of the circulatory system: Secondary | ICD-10-CM

## 2018-12-18 DIAGNOSIS — R4701 Aphasia: Secondary | ICD-10-CM | POA: Diagnosis present

## 2018-12-18 DIAGNOSIS — E8809 Other disorders of plasma-protein metabolism, not elsewhere classified: Secondary | ICD-10-CM | POA: Diagnosis present

## 2018-12-18 DIAGNOSIS — I76 Septic arterial embolism: Secondary | ICD-10-CM | POA: Diagnosis present

## 2018-12-18 DIAGNOSIS — Z66 Do not resuscitate: Secondary | ICD-10-CM | POA: Diagnosis present

## 2018-12-18 DIAGNOSIS — I10 Essential (primary) hypertension: Secondary | ICD-10-CM | POA: Diagnosis not present

## 2018-12-18 DIAGNOSIS — Z79891 Long term (current) use of opiate analgesic: Secondary | ICD-10-CM

## 2018-12-18 DIAGNOSIS — Z20828 Contact with and (suspected) exposure to other viral communicable diseases: Secondary | ICD-10-CM | POA: Diagnosis present

## 2018-12-18 DIAGNOSIS — I6340 Cerebral infarction due to embolism of unspecified cerebral artery: Secondary | ICD-10-CM | POA: Diagnosis not present

## 2018-12-18 DIAGNOSIS — M4646 Discitis, unspecified, lumbar region: Secondary | ICD-10-CM | POA: Diagnosis not present

## 2018-12-18 DIAGNOSIS — I2601 Septic pulmonary embolism with acute cor pulmonale: Secondary | ICD-10-CM | POA: Diagnosis present

## 2018-12-18 DIAGNOSIS — Z8673 Personal history of transient ischemic attack (TIA), and cerebral infarction without residual deficits: Secondary | ICD-10-CM

## 2018-12-18 DIAGNOSIS — A4101 Sepsis due to Methicillin susceptible Staphylococcus aureus: Secondary | ICD-10-CM | POA: Diagnosis present

## 2018-12-18 DIAGNOSIS — I110 Hypertensive heart disease with heart failure: Secondary | ICD-10-CM | POA: Diagnosis present

## 2018-12-18 DIAGNOSIS — R29702 NIHSS score 2: Secondary | ICD-10-CM | POA: Diagnosis present

## 2018-12-18 DIAGNOSIS — Z79899 Other long term (current) drug therapy: Secondary | ICD-10-CM

## 2018-12-18 DIAGNOSIS — E876 Hypokalemia: Secondary | ICD-10-CM | POA: Diagnosis not present

## 2018-12-18 DIAGNOSIS — Z79890 Hormone replacement therapy (postmenopausal): Secondary | ICD-10-CM

## 2018-12-18 DIAGNOSIS — I330 Acute and subacute infective endocarditis: Secondary | ICD-10-CM

## 2018-12-18 DIAGNOSIS — G9341 Metabolic encephalopathy: Secondary | ICD-10-CM | POA: Diagnosis present

## 2018-12-18 DIAGNOSIS — I872 Venous insufficiency (chronic) (peripheral): Secondary | ICD-10-CM | POA: Diagnosis not present

## 2018-12-18 DIAGNOSIS — Y95 Nosocomial condition: Secondary | ICD-10-CM | POA: Diagnosis present

## 2018-12-18 DIAGNOSIS — I2609 Other pulmonary embolism with acute cor pulmonale: Secondary | ICD-10-CM

## 2018-12-18 DIAGNOSIS — M4620 Osteomyelitis of vertebra, site unspecified: Secondary | ICD-10-CM | POA: Diagnosis not present

## 2018-12-18 DIAGNOSIS — E785 Hyperlipidemia, unspecified: Secondary | ICD-10-CM | POA: Diagnosis present

## 2018-12-18 DIAGNOSIS — I2699 Other pulmonary embolism without acute cor pulmonale: Secondary | ICD-10-CM | POA: Diagnosis not present

## 2018-12-18 DIAGNOSIS — I420 Dilated cardiomyopathy: Secondary | ICD-10-CM | POA: Diagnosis present

## 2018-12-18 DIAGNOSIS — R0603 Acute respiratory distress: Secondary | ICD-10-CM | POA: Diagnosis not present

## 2018-12-18 DIAGNOSIS — Z7401 Bed confinement status: Secondary | ICD-10-CM

## 2018-12-18 DIAGNOSIS — R6520 Severe sepsis without septic shock: Secondary | ICD-10-CM | POA: Diagnosis not present

## 2018-12-18 DIAGNOSIS — M542 Cervicalgia: Secondary | ICD-10-CM | POA: Diagnosis present

## 2018-12-18 DIAGNOSIS — I639 Cerebral infarction, unspecified: Secondary | ICD-10-CM | POA: Diagnosis not present

## 2018-12-18 DIAGNOSIS — R7881 Bacteremia: Secondary | ICD-10-CM | POA: Diagnosis not present

## 2018-12-18 LAB — CBC WITH DIFFERENTIAL/PLATELET
Abs Immature Granulocytes: 0.05 10*3/uL (ref 0.00–0.07)
Basophils Absolute: 0 10*3/uL (ref 0.0–0.1)
Basophils Relative: 0 %
Eosinophils Absolute: 1.3 10*3/uL — ABNORMAL HIGH (ref 0.0–0.5)
Eosinophils Relative: 15 %
HCT: 24.9 % — ABNORMAL LOW (ref 36.0–46.0)
Hemoglobin: 7.9 g/dL — ABNORMAL LOW (ref 12.0–15.0)
Immature Granulocytes: 1 %
Lymphocytes Relative: 14 %
Lymphs Abs: 1.2 10*3/uL (ref 0.7–4.0)
MCH: 33.3 pg (ref 26.0–34.0)
MCHC: 31.7 g/dL (ref 30.0–36.0)
MCV: 105.1 fL — ABNORMAL HIGH (ref 80.0–100.0)
Monocytes Absolute: 0.5 10*3/uL (ref 0.1–1.0)
Monocytes Relative: 5 %
Neutro Abs: 5.6 10*3/uL (ref 1.7–7.7)
Neutrophils Relative %: 65 %
Platelets: 261 10*3/uL (ref 150–400)
RBC: 2.37 MIL/uL — ABNORMAL LOW (ref 3.87–5.11)
RDW: 15.4 % (ref 11.5–15.5)
WBC: 8.7 10*3/uL (ref 4.0–10.5)
nRBC: 0 % (ref 0.0–0.2)

## 2018-12-18 LAB — POCT I-STAT EG7
Bicarbonate: 24.4 mmol/L (ref 20.0–28.0)
Calcium, Ion: 1.18 mmol/L (ref 1.15–1.40)
HCT: 22 % — ABNORMAL LOW (ref 36.0–46.0)
Hemoglobin: 7.5 g/dL — ABNORMAL LOW (ref 12.0–15.0)
O2 Saturation: 99 %
Potassium: 4.5 mmol/L (ref 3.5–5.1)
Sodium: 133 mmol/L — ABNORMAL LOW (ref 135–145)
TCO2: 26 mmol/L (ref 22–32)
pCO2, Ven: 38.8 mmHg — ABNORMAL LOW (ref 44.0–60.0)
pH, Ven: 7.407 (ref 7.250–7.430)
pO2, Ven: 129 mmHg — ABNORMAL HIGH (ref 32.0–45.0)

## 2018-12-18 LAB — URINALYSIS, ROUTINE W REFLEX MICROSCOPIC
Bilirubin Urine: NEGATIVE
Glucose, UA: NEGATIVE mg/dL
Hgb urine dipstick: NEGATIVE
Ketones, ur: NEGATIVE mg/dL
Nitrite: NEGATIVE
Protein, ur: NEGATIVE mg/dL
Specific Gravity, Urine: 1.029 (ref 1.005–1.030)
pH: 7 (ref 5.0–8.0)

## 2018-12-18 LAB — COMPREHENSIVE METABOLIC PANEL
ALT: 19 U/L (ref 0–44)
AST: 30 U/L (ref 15–41)
Albumin: 1.6 g/dL — ABNORMAL LOW (ref 3.5–5.0)
Alkaline Phosphatase: 105 U/L (ref 38–126)
Anion gap: 9 (ref 5–15)
BUN: 9 mg/dL (ref 8–23)
CO2: 22 mmol/L (ref 22–32)
Calcium: 8 mg/dL — ABNORMAL LOW (ref 8.9–10.3)
Chloride: 102 mmol/L (ref 98–111)
Creatinine, Ser: 0.85 mg/dL (ref 0.44–1.00)
GFR calc Af Amer: >60 mL/min (ref >60)
GFR calc non Af Amer: >60 mL/min (ref >60)
Glucose, Bld: 151 mg/dL — ABNORMAL HIGH (ref 70–99)
Potassium: 4.3 mmol/L (ref 3.5–5.1)
Sodium: 133 mmol/L — ABNORMAL LOW (ref 135–145)
Total Bilirubin: 0.1 mg/dL — ABNORMAL LOW (ref 0.3–1.2)
Total Protein: 4.6 g/dL — ABNORMAL LOW (ref 6.5–8.1)

## 2018-12-18 LAB — TROPONIN I (HIGH SENSITIVITY)
Troponin I (High Sensitivity): 8 ng/L (ref <18)
Troponin I (High Sensitivity): 7 ng/L (ref <18)

## 2018-12-18 LAB — D-DIMER, QUANTITATIVE: D-Dimer, Quant: 3.17 ug/mL-FEU — ABNORMAL HIGH (ref 0.00–0.50)

## 2018-12-18 LAB — APTT: aPTT: 41 seconds — ABNORMAL HIGH (ref 24–36)

## 2018-12-18 LAB — PROTIME-INR
INR: 1.2 (ref 0.8–1.2)
Prothrombin Time: 15.1 seconds (ref 11.4–15.2)

## 2018-12-18 LAB — PREPARE RBC (CROSSMATCH)

## 2018-12-18 LAB — POC OCCULT BLOOD, ED: Fecal Occult Bld: NEGATIVE

## 2018-12-18 LAB — LACTIC ACID, PLASMA
Lactic Acid, Venous: 2 mmol/L (ref 0.5–1.9)
Lactic Acid, Venous: 1.4 mmol/L (ref 0.5–1.9)

## 2018-12-18 LAB — BRAIN NATRIURETIC PEPTIDE: B Natriuretic Peptide: 291.9 pg/mL — ABNORMAL HIGH (ref 0.0–100.0)

## 2018-12-18 LAB — ABO/RH: ABO/RH(D): A POS

## 2018-12-18 LAB — SARS CORONAVIRUS 2 BY RT PCR (HOSPITAL ORDER, PERFORMED IN ~~LOC~~ HOSPITAL LAB): SARS Coronavirus 2: NEGATIVE

## 2018-12-18 LAB — PROCALCITONIN: Procalcitonin: 0.13 ng/mL

## 2018-12-18 IMAGING — CT CT ANGIO CHEST
2 of 7 series · 17 of 46 positions shown · IV contrast (APPLIED)
Comparison: [DATE] and [DATE]

CLINICAL DATA: Shortness of breath.

EXAM:
CT ANGIOGRAPHY CHEST WITH CONTRAST
TECHNIQUE: Multidetector CT imaging of the chest was performed using the
standard protocol during bolus administration of intravenous
contrast. Multiplanar CT image reconstructions and MIPs were
obtained to evaluate the vascular anatomy.
CONTRAST:  80 mL OMNIPAQUE IOHEXOL 350 MG/ML SOLN

[Series 8: thins · axial · 0.65mm/px · z∈[+1092,+1323]mm · 14 of 372 slices shown]
[im 21/372  lung]
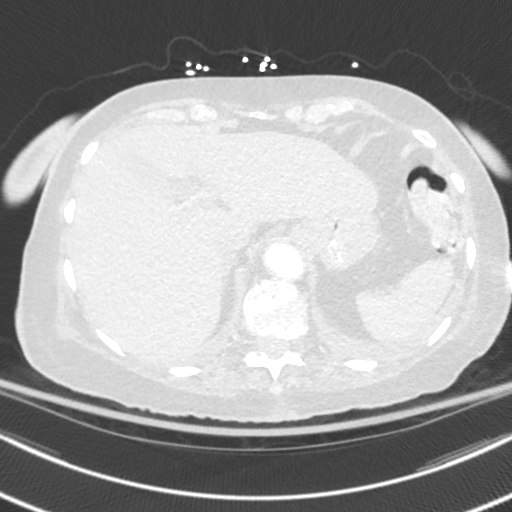
[im 42/372  soft-tissue]
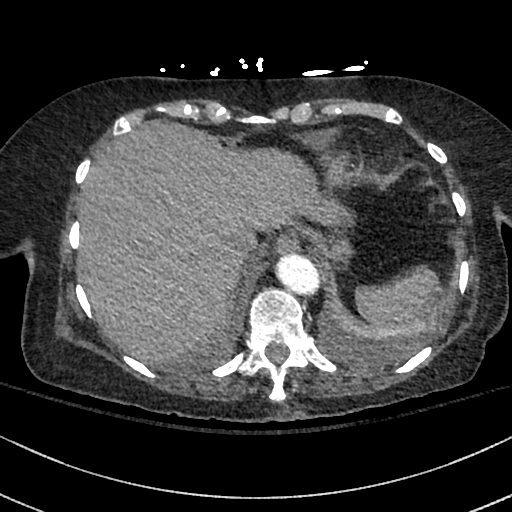
[im 83/372  lung]
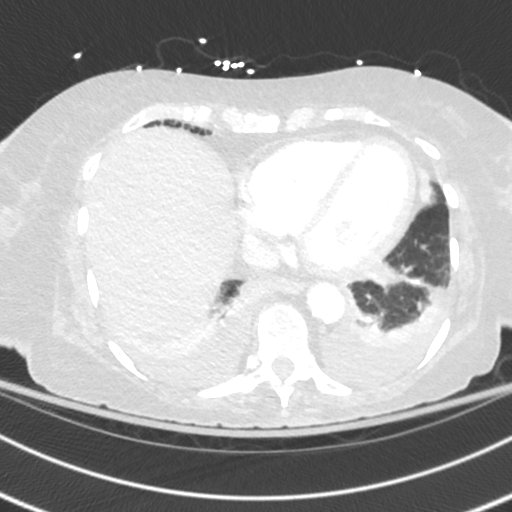
[im 104/372  soft-tissue]
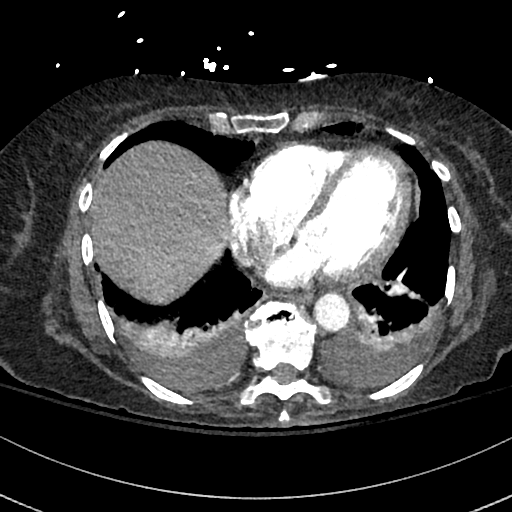
[im 124/372  lung]
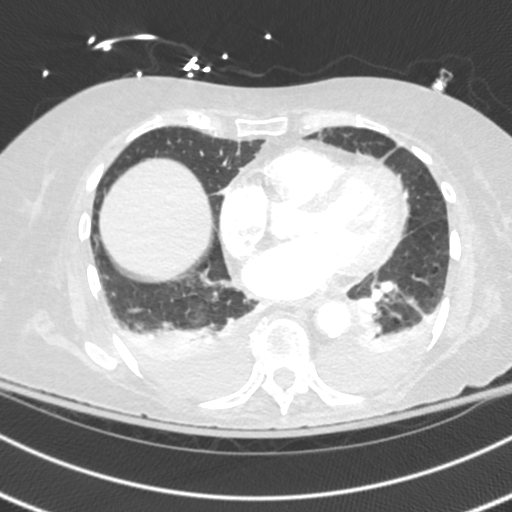
[im 145/372  soft-tissue]
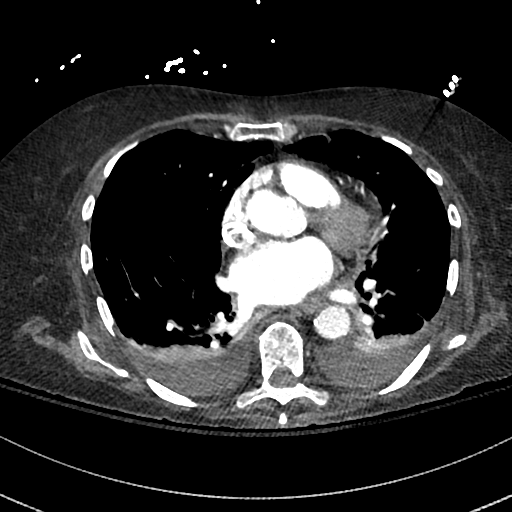
[im 165/372  lung]
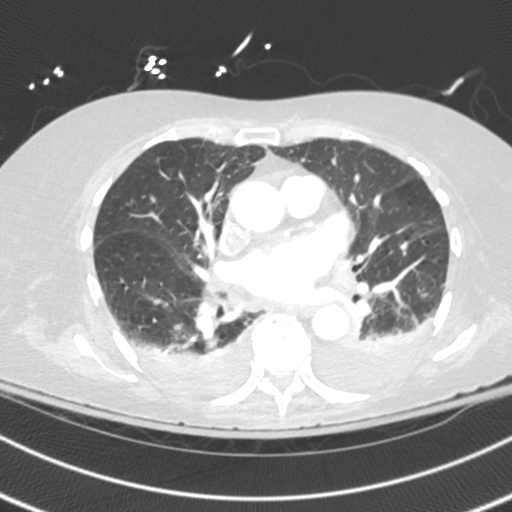
[im 207/372  soft-tissue]
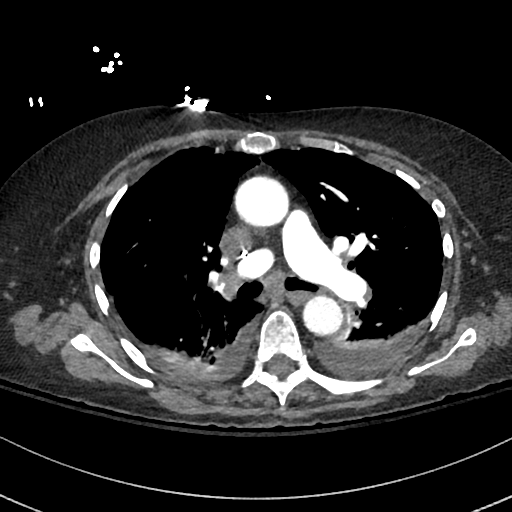
[im 227/372  lung]
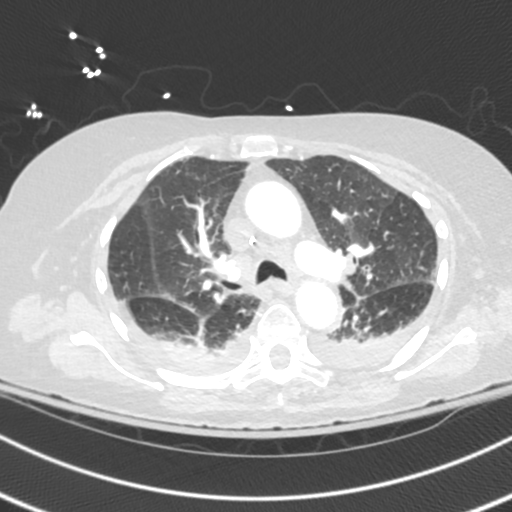
[im 248/372  soft-tissue]
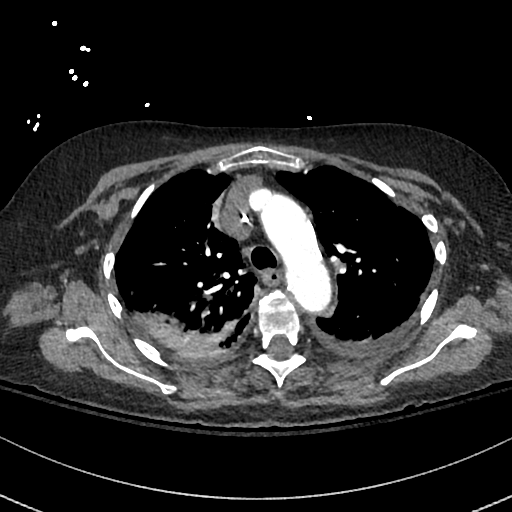
[im 268/372  lung]
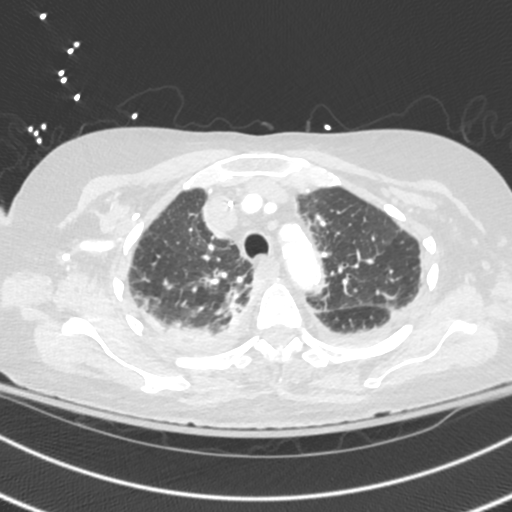
[im 289/372  soft-tissue]
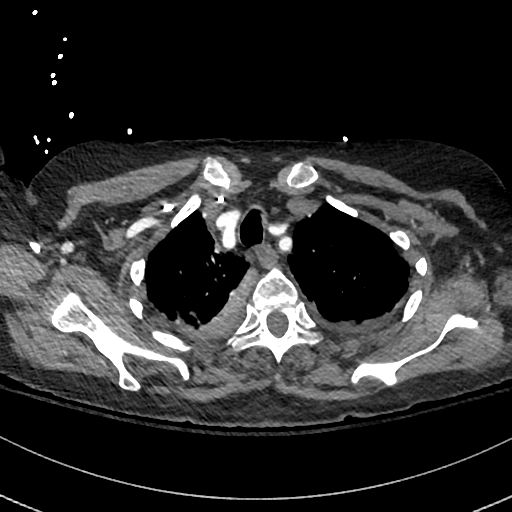
[im 330/372  lung]
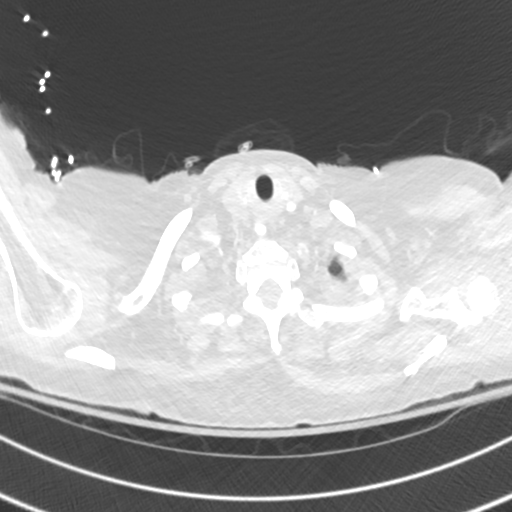
[im 351/372  soft-tissue]
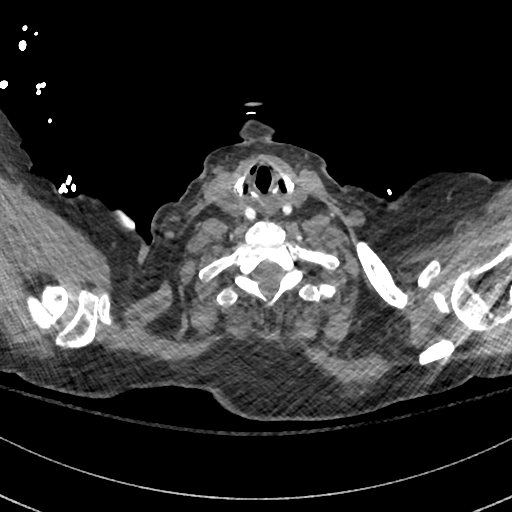

[Series 9: cor · coronal · 0.52mm/px · 3 of 124 slices shown]
[im 31/124  soft-tissue]
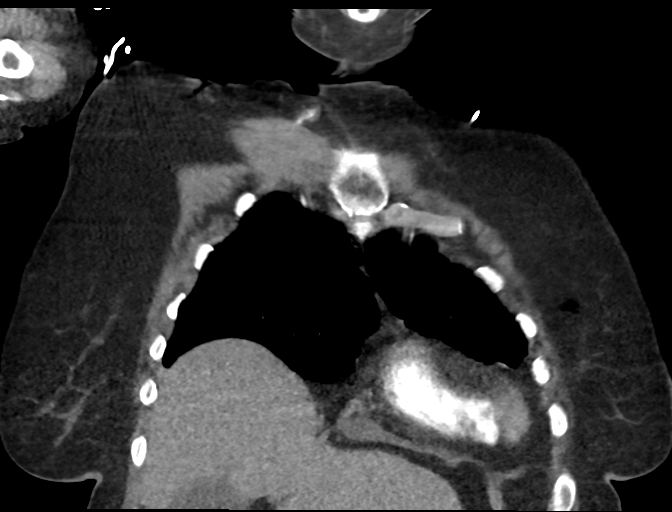
[im 62/124  soft-tissue]
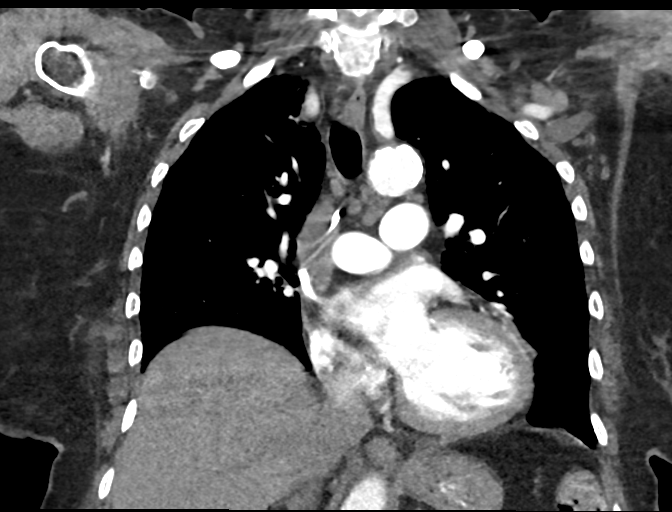
[im 93/124  soft-tissue]
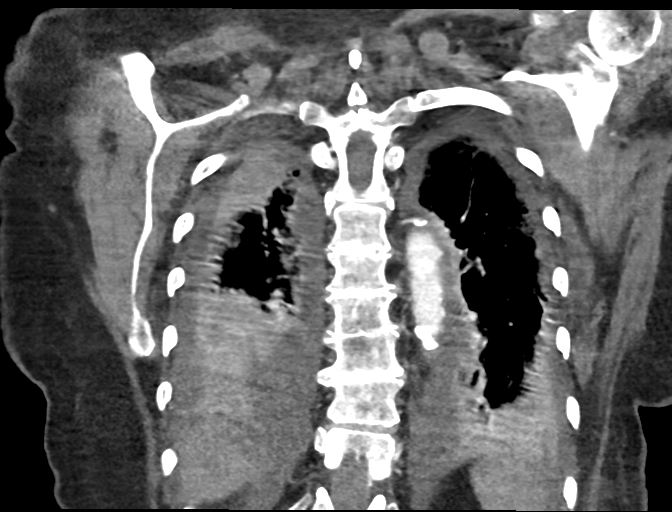

[17 of 46 positions shown; findings below may reference images not displayed]

FINDINGS: Cardiovascular: Heart is normal size. Small amount of pericardial
fluid is present anteriorly. Thoracic aorta demonstrates no evidence
of aneurysm or dissection. There is mild calcified plaque over the
thoracic aorta. Pulmonary or system is well opacified demonstrates
suggestion of small emboli over the proximal left upper lobar
pulmonary artery. There is also suggestion of subtle emboli over a
subsegmental right middle lobe pulmonary artery. RV/LV ratio 0.96.
Remaining vascular structures are unremarkable.

Mediastinum/Nodes: 1 cm precarinal lymph node is present. 1.3 cm
subcarinal lymph node. No significant hilar adenopathy. Remaining
mediastinal structures are unremarkable.

Lungs/Pleura: Lungs are adequately inflated demonstrate small
bilateral pleural effusions with associated posterior atelectatic
change. Subtle patchy density over the right upper lobe with mild
nodularity as this could represent infection infection. Airways are
unremarkable.

Upper Abdomen: No acute findings. Mild calcified plaque over the
abdominal aorta.

Musculoskeletal: Degenerative change of the spine.

Review of the MIP images confirms the above findings.
IMPRESSION: 1. Findings suggesting subtle emboli over the proximal left upper
lobe pulmonary artery and subsegmental right middle lobar pulmonary
artery. RV/LV ratio slightly elevated at 0.96 indicating mild right
heart strain.

2. Small bilateral pleural effusions with associated atelectasis.
Mild patchy post density over the right upper lobe with mild
nodularity which may be due to infection. Minimal mediastinal
adenopathy likely reactive. Recommend follow-up CT 4-6 weeks.

3.  Aortic Atherosclerosis ([DF]-[DF]).

Critical Value/emergent results were called by telephone at the time
of interpretation on [DATE] at [DATE] to [REDACTED] ,
who verbally acknowledged these results.

## 2018-12-18 IMAGING — DX DG CHEST 1V PORT
1 series · 1 of 1 positions shown · non-contrast
Comparison: Chest CT dated [DATE] and radiograph dated
[DATE]

CLINICAL DATA: 86-year-old female with shortness of breath and
cough.

EXAM:
PORTABLE CHEST 1 VIEW

[chest]
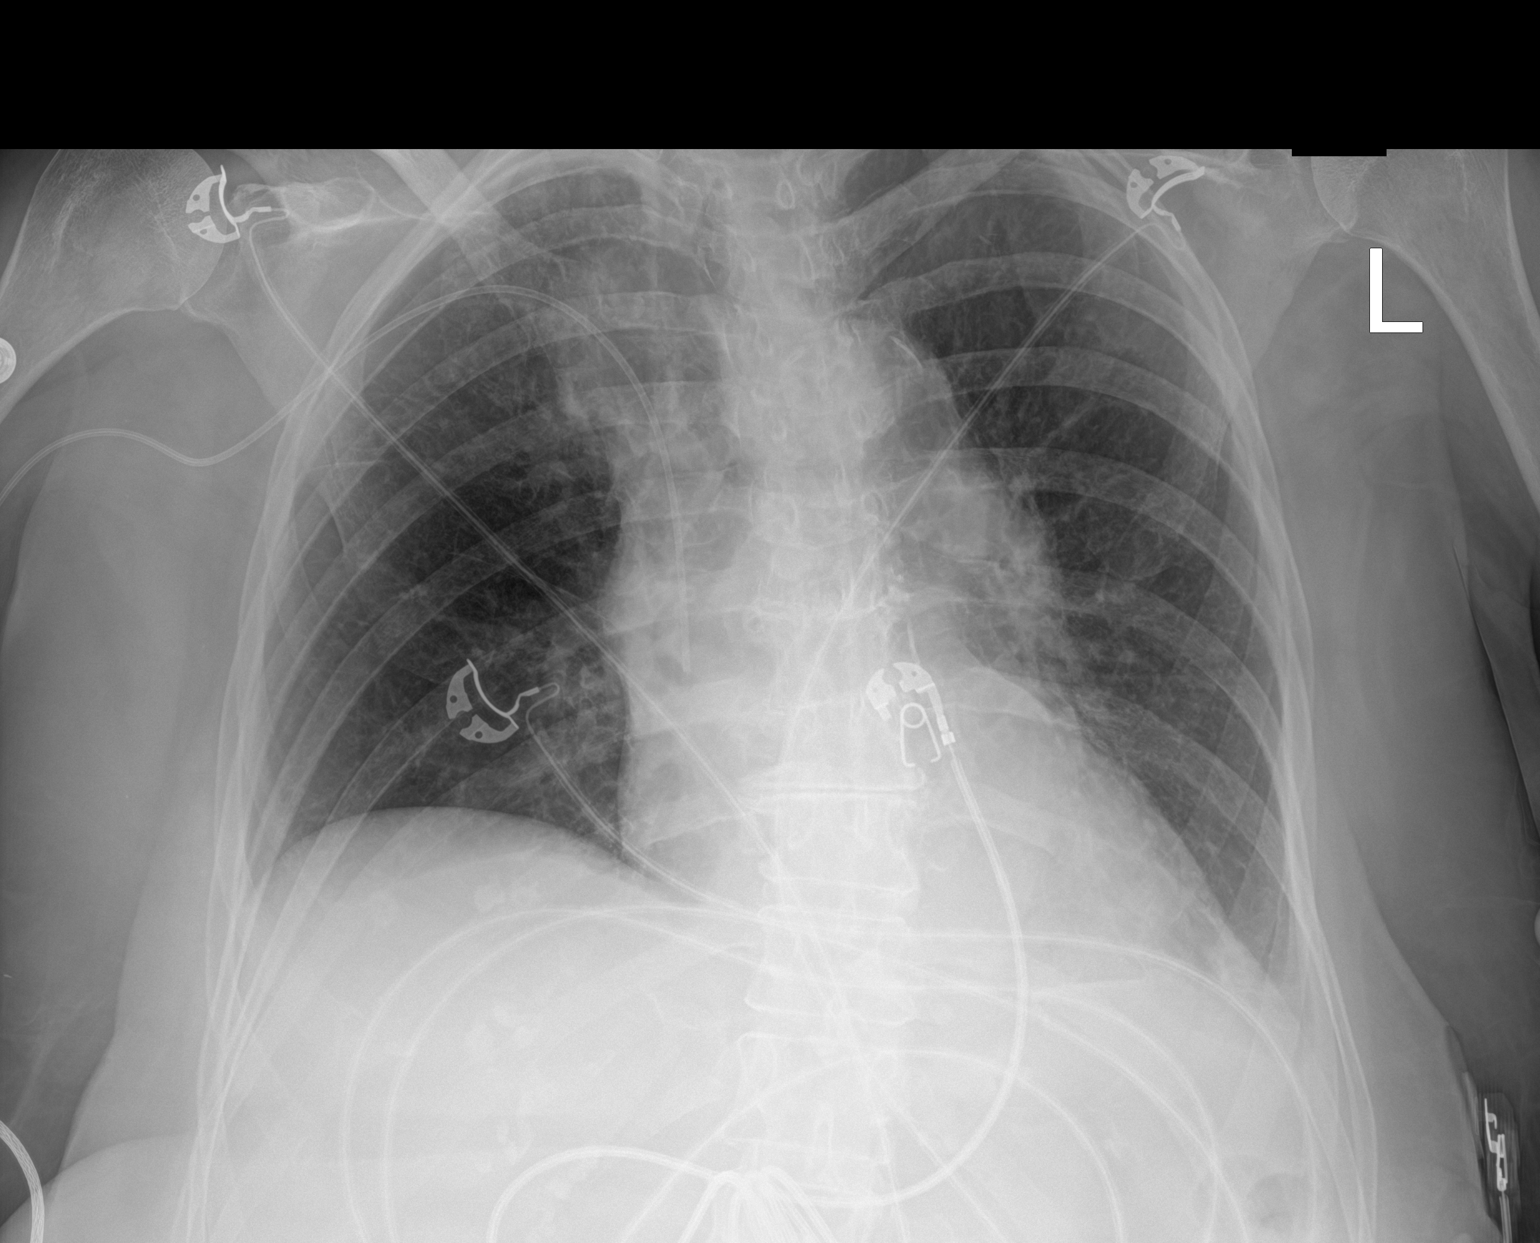

[1 of 1 positions shown; findings below may reference images not displayed]

FINDINGS: Right-sided PICC with tip at the cavoatrial junction. Left lung base
density, likely combination of trace pleural effusion and
subsegmental atelectasis/scarring the right lung is clear. There is
no pneumothorax. Stable cardiomediastinal silhouette. The aorta is
tortuous. There is atherosclerotic calcification of the aorta. No
acute osseous pathology. Developing infiltrate is not excluded.
Clinical correlation is recommended.
IMPRESSION: 1. Right-sided PICC with tip at the cavoatrial junction. No
pneumothorax.
2. Left lung base density, likely combination of trace pleural
effusion and atelectasis/scarring. Developing infiltrate is not
excluded.

## 2018-12-18 MED ORDER — ACETAMINOPHEN 650 MG RE SUPP: 650.0000 mg | Freq: Four times a day (QID) | RECTAL | Status: DC | PRN

## 2018-12-18 MED ORDER — SODIUM CHLORIDE 0.9% IV SOLUTION
Freq: Once | INTRAVENOUS | Status: AC
  Administered 2018-12-18: 20:00:00 via INTRAVENOUS

## 2018-12-18 MED ORDER — IOHEXOL 350 MG/ML SOLN
80.0000 mL | Freq: Once | INTRAVENOUS | Status: AC | PRN
  Administered 2018-12-18: 100 mL via INTRAVENOUS

## 2018-12-18 MED ORDER — DICLOFENAC SODIUM 1 % TD GEL
4.0000 g | Freq: Four times a day (QID) | TRANSDERMAL | Status: DC | PRN
  Filled 2018-12-18: qty 100

## 2018-12-18 MED ORDER — SODIUM CHLORIDE 0.9% FLUSH
3.0000 mL | Freq: Two times a day (BID) | INTRAVENOUS | Status: DC
  Administered 2018-12-20 – 2018-12-31 (×18): 3 mL via INTRAVENOUS

## 2018-12-18 MED ORDER — SODIUM CHLORIDE 0.9% FLUSH: 10.0000 mL | INTRAVENOUS | Status: DC | PRN

## 2018-12-18 MED ORDER — ALBUTEROL SULFATE (2.5 MG/3ML) 0.083% IN NEBU
2.5000 mg | INHALATION_SOLUTION | Freq: Four times a day (QID) | RESPIRATORY_TRACT | Status: DC | PRN
  Filled 2018-12-18: qty 3

## 2018-12-18 MED ORDER — ONDANSETRON HCL 4 MG/2ML IJ SOLN: 4.0000 mg | Freq: Four times a day (QID) | INTRAMUSCULAR | Status: DC | PRN

## 2018-12-18 MED ORDER — VANCOMYCIN HCL IN DEXTROSE 1-5 GM/200ML-% IV SOLN: 1000.0000 mg | Freq: Once | INTRAVENOUS | Status: DC

## 2018-12-18 MED ORDER — HEPARIN BOLUS VIA INFUSION
5000.0000 [IU] | Freq: Once | INTRAVENOUS | Status: AC
  Administered 2018-12-18: 5000 [IU] via INTRAVENOUS
  Filled 2018-12-18: qty 5000

## 2018-12-18 MED ORDER — SODIUM CHLORIDE 0.9 % IV SOLN
2.0000 g | Freq: Two times a day (BID) | INTRAVENOUS | Status: DC
  Administered 2018-12-19 – 2018-12-20 (×3): 2 g via INTRAVENOUS
  Filled 2018-12-18 (×3): qty 2

## 2018-12-18 MED ORDER — VANCOMYCIN HCL 10 G IV SOLR
1500.0000 mg | Freq: Once | INTRAVENOUS | Status: AC
  Administered 2018-12-18: 1500 mg via INTRAVENOUS
  Filled 2018-12-18: qty 1500

## 2018-12-18 MED ORDER — VANCOMYCIN HCL IN DEXTROSE 1-5 GM/200ML-% IV SOLN
1000.0000 mg | INTRAVENOUS | Status: DC
  Administered 2018-12-19: 1000 mg via INTRAVENOUS
  Filled 2018-12-18 (×2): qty 200

## 2018-12-18 MED ORDER — HEPARIN (PORCINE) 25000 UT/250ML-% IV SOLN
950.0000 [IU]/h | INTRAVENOUS | Status: AC
  Administered 2018-12-18: 1200 [IU]/h via INTRAVENOUS
  Administered 2018-12-19 – 2018-12-20 (×2): 950 [IU]/h via INTRAVENOUS
  Filled 2018-12-18 (×3): qty 250

## 2018-12-18 MED ORDER — SODIUM CHLORIDE 0.9 % IV SOLN
2.0000 g | Freq: Once | INTRAVENOUS | Status: AC
  Administered 2018-12-18: 2 g via INTRAVENOUS
  Filled 2018-12-18: qty 2

## 2018-12-18 MED ORDER — ONDANSETRON HCL 4 MG PO TABS
4.0000 mg | ORAL_TABLET | Freq: Four times a day (QID) | ORAL | Status: DC | PRN
  Administered 2018-12-26: 4 mg via ORAL
  Filled 2018-12-18: qty 1

## 2018-12-18 MED ORDER — FUROSEMIDE 10 MG/ML IJ SOLN
20.0000 mg | Freq: Once | INTRAMUSCULAR | Status: AC
  Administered 2018-12-18: 20 mg via INTRAVENOUS
  Filled 2018-12-18: qty 2

## 2018-12-18 MED ORDER — ACETAMINOPHEN 325 MG PO TABS
650.0000 mg | ORAL_TABLET | Freq: Four times a day (QID) | ORAL | Status: DC | PRN
  Administered 2018-12-19 – 2018-12-20 (×2): 650 mg via ORAL
  Filled 2018-12-18 (×2): qty 2

## 2018-12-18 NOTE — ED Notes (Signed)
pts left upper arm reddened and swollen PA aware pa aware of lactic 2.0

## 2018-12-18 NOTE — ED Provider Notes (Signed)
[facility] EMERGENCY DEPARTMENT Provider Note   CSN: [id number] Arrival date & time: 12/18/18  1030     History   Chief Complaint Chief Complaint  Patient presents with   Fever   Altered Mental Status    HPI Autumn [name] is a 85 y.o. female.     The history is provided by the patient, medical records, the EMS personnel and the nursing home. No language interpreter was used.  Fever Altered Mental Status Associated symptoms: fever      85 year old female with history of hypothyroidism, hypertension, arthritis, left bundle branch block, GERD, recent hospitalization for nausea vomiting and subsequently found to have positive blood culture shows MSSA currently treated with ceftriaxone, as well as CT head showing left acute subacute is cerebellar ischemic infarct.  She also had a falsely positive Covid test during hospitalization.  She was discharged to a skilled nursing facility on 11/26/2018.  2 days ago, nursing staff facility noticed that patient is having decreasing her mental status.  They also noticed that she has some swelling in her right arm at the site of her IV access.  Stat obtain a chest x-ray as well as Doppler ultrasound of the arms both which were normal according to the note.  Today patient has a temperature of 102.4, was found to have an O2 sats in the 80s.  Patient also had an indwelling Foley catheter due to being bedbound.  Patient currently denies any active pain but she is also a poor historian.  She does have DNR/DNI CODE STATUS.      Past Medical History:  Diagnosis Date   Anxiety    GERD (gastroesophageal reflux disease)    Hypertension    Hypothyroid    LBBB (left bundle branch block)    MRSA bacteremia 11/19/2018   Osteopenia    Peripheral neuropathy    Venous insufficiency     Patient Active Problem List   Diagnosis Date Noted   Acute urinary retention    Acute on chronic combined systolic and diastolic CHF  (congestive heart failure) (HCC)    Hypothyroidism    Real time reverse transcriptase PCR positive for severe acute respiratory syndrome coronavirus 2 (SARS-CoV-2) 11/25/2018   Palliative care encounter    MRSA bacteremia    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)    AKI (acute kidney injury) ([place])    Cerebral embolism with cerebral infarction 11/20/2018   Endocarditis of mitral valve    MSSA bacteremia 11/19/2018   Septic embolism (HCC)    Pain of upper abdomen    Postural dizziness with near syncope 11/13/2018   Tachypnea    Nausea and vomiting 11/12/2018   Bilateral hydronephrosis 11/12/2018   Elevated troponin 11/12/2018   Lactic acidosis 11/12/2018   Chronic neck and back pain 11/12/2018   Essential hypertension 11/12/2018   Normal coronary arteries 09/19/2013   LBBB (left bundle branch block) 09/19/2013   Congestive dilated cardiomyopathy ([place]) 07/24/2013    Past Surgical History:  Procedure Laterality Date   ABDOMINAL HYSTERECTOMY     BLADDER SURGERY     LEFT HEART CATHETERIZATION WITH CORONARY ANGIOGRAM N/A 09/02/2013   Procedure: LEFT HEART CATHETERIZATION WITH CORONARY ANGIOGRAM;  Surgeon: Minus Breeding, MD;  Location: [facility] CATH LAB;  Service: Cardiovascular;  Laterality: N/A;   RENAL ANGIOGRAM N/A 09/02/2013   Procedure: RENAL ANGIOGRAM;  Surgeon: Minus Breeding, MD;  Location: [facility] CATH LAB;  Service: Cardiovascular;  Laterality: N/A;   TEE WITHOUT CARDIOVERSION  N/A 11/20/2018   Procedure: TRANSESOPHAGEAL ECHOCARDIOGRAM (TEE);  Surgeon: Dorothy Spark, MD;  Location: [facility] ENDOSCOPY;  Service: Cardiovascular;  Laterality: N/A;     OB History   No obstetric history on file.      Home Medications    Prior to Admission medications   Medication Sig Start Date End Date Taking? Authorizing Provider  Ascorbic Acid (VITAMIN C PO) Take 1 tablet by mouth daily.    [provider]  aspirin 325 MG tablet  Take 1 tablet (325 mg total) by mouth daily. 11/15/18   Kayleen Memos, DO  carvedilol (COREG) 12.5 MG tablet Take 1 tablet (12.5 mg total) by mouth 2 (two) times daily. 11/15/18   Kayleen Memos, DO  cefTRIAXone (ROCEPHIN) IVPB Inject 2 g into the vein every 12 (twelve) hours. Indication: Mitral valve endocarditis with septic embolization Last Day of Therapy:  12/31/2018 Labs - Once weekly:  CBC/D and BMP, Labs - Every other week:  ESR and CRP 11/26/18 12/31/18  Shelly Coss, MD  Cholecalciferol (VITAMIN D PO) Take 1 tablet by mouth daily.    [provider]  Cyanocobalamin (VITAMIN B-12 PO) Take 1 tablet by mouth daily.    [provider]  diclofenac sodium (VOLTAREN) 1 % GEL Apply 4 g topically 4 (four) times daily. Patient taking differently: Apply 4 g topically 4 (four) times daily as needed (pain).  10/02/18   Blanchie Dessert, MD  estradiol (ESTRACE) 1 MG tablet Take 1 mg by mouth daily.    [provider]  feeding supplement, ENSURE ENLIVE, (ENSURE ENLIVE) LIQD Take 237 mLs by mouth 3 (three) times daily between meals. 11/28/18   Eugenie Filler, MD  levothyroxine (SYNTHROID, LEVOTHROID) 50 MCG tablet Take 50 mcg by mouth daily before breakfast.    [provider]  losartan (COZAAR) 100 MG tablet Take 1 tablet (100 mg total) by mouth daily. 11/15/18   Kayleen Memos, DO  MELATONIN PO Take 1 mL by mouth at bedtime.    [provider]  Multiple Vitamins-Minerals (HAIR SKIN AND NAILS FORMULA PO) Take 1 tablet by mouth daily.    [provider]  pantoprazole (PROTONIX) 40 MG tablet Take 1 tablet (40 mg total) by mouth 2 (two) times daily. Take 1 tablet 40 mg BID x 6 weeks then 1 tab 40 mg daily afterwards 11/15/18   Kayleen Memos, DO  senna-docusate (SENOKOT-S) 8.6-50 MG tablet Take 2 tablets by mouth 2 (two) times daily. 11/17/18   Kayleen Memos, DO  traMADol (ULTRAM) 50 MG tablet Take 1 tablet (50 mg total) by mouth daily as needed for  severe pain. 11/17/18   Kayleen Memos, DO  TURMERIC CURCUMIN PO Take 1 tablet by mouth daily.     [provider]    Family History Family History  Problem Relation Age of Onset   Hypertension Mother    Prostate cancer Father     Social History Social History   Tobacco Use   Smoking status: Never Smoker   Smokeless tobacco: Never Used  Substance Use Topics   Alcohol use: Yes    Frequency: Never    Comment: Once every three months    Drug use: No     Allergies   Chlorhexidine, Hydrochlorothiazide, Ramipril, Remeron [mirtazapine], and Sulfa antibiotics   Review of Systems Review of Systems  Unable to perform ROS: Mental status change  Constitutional: Positive for fever.     Physical Exam Updated Vital Signs BP Marland Kitchen)  121/54 (BP Location: Right Arm)    Pulse 99    Temp 99.6 F (37.6 C) (Rectal)    Resp (!) 22    Ht 5' 3" (1.6 m)    Wt 75.4 kg Comment: per last visit   SpO2 (!) 78%    BMI 29.45 kg/m   Physical Exam Vitals signs and nursing note reviewed.  Constitutional:      General: She is not in acute distress.    Appearance: She is well-developed.     Comments: Ill-appearing elderly female  HENT:     Head: Atraumatic.     Mouth/Throat:     Mouth: Mucous membranes are dry.  Eyes:     Conjunctiva/sclera: Conjunctivae normal.  Neck:     Musculoskeletal: Neck supple. No neck rigidity.  Cardiovascular:     Rate and Rhythm: Tachycardia present.     Pulses: Normal pulses.     Heart sounds: Normal heart sounds.  Pulmonary:     Effort: Pulmonary effort is normal.     Breath sounds: Normal breath sounds.  Abdominal:     Palpations: Abdomen is soft.     Tenderness: There is no abdominal tenderness.  Musculoskeletal:     Comments: 5 out of 5 strength to bilateral upper extremities, 3 out of 5 strength to bilateral lower extremities.  Skin:    Findings: No rash.  Neurological:     Mental Status: She is alert. She is disoriented.     Comments:  Alert to self and location but recall month (Oct instead of Nov) , and the current president (Bush instead of Trump) incorrectly.   Psychiatric:        Mood and Affect: Mood normal.      ED Treatments / Results  Labs (all labs ordered are listed, but only abnormal results are displayed) Labs Reviewed  LACTIC ACID, PLASMA - Abnormal; Notable for the following components:      Result Value   Lactic Acid, Venous 2.0 (*)    All other components within normal limits  COMPREHENSIVE METABOLIC PANEL - Abnormal; Notable for the following components:   Sodium 133 (*)    Glucose, Bld 151 (*)    Calcium 8.0 (*)    Total Protein 4.6 (*)    Albumin 1.6 (*)    Total Bilirubin 0.1 (*)    All other components within normal limits  CBC WITH DIFFERENTIAL/PLATELET - Abnormal; Notable for the following components:   RBC 2.37 (*)    Hemoglobin 7.9 (*)    HCT 24.9 (*)    MCV 105.1 (*)    Eosinophils Absolute 1.3 (*)    All other components within normal limits  APTT - Abnormal; Notable for the following components:   aPTT 41 (*)    All other components within normal limits  D-DIMER, QUANTITATIVE (NOT AT [facility]) - Abnormal; Notable for the following components:   D-Dimer, Quant 3.17 (*)    All other components within normal limits  POCT I-STAT EG7 - Abnormal; Notable for the following components:   pCO2, Ven 38.8 (*)    pO2, Ven 129.0 (*)    Sodium 133 (*)    HCT 22.0 (*)    Hemoglobin 7.5 (*)    All other components within normal limits  SARS CORONAVIRUS 2 BY RT PCR (HOSPITAL ORDER, [facility])  CULTURE, BLOOD (ROUTINE X 2)  CULTURE, BLOOD (ROUTINE X 2)  URINE CULTURE  LACTIC ACID, PLASMA  PROTIME-INR  PROCALCITONIN  URINALYSIS, ROUTINE W REFLEX MICROSCOPIC  BLOOD GAS, VENOUS  TROPONIN I (HIGH SENSITIVITY)  TROPONIN I (HIGH SENSITIVITY)    EKG EKG Interpretation  Date/Time:  Wednesday December 18 2018 10:33:39 EST Ventricular Rate:  79 PR Interval:      QRS Duration: 132 QT Interval:  404 QTC Calculation: 464 R Axis:   22 Text Interpretation: Sinus rhythm Left bundle branch block Confirmed by Virgel Manifold [phone]) on 12/18/2018 2:09:03 PM   Radiology Ct Angio Chest Pe W And/or Wo Contrast  Result Date: 12/18/2018 CLINICAL DATA:  Shortness of breath. EXAM: CT ANGIOGRAPHY CHEST WITH CONTRAST TECHNIQUE: Multidetector CT imaging of the chest was performed using the standard protocol during bolus administration of intravenous contrast. Multiplanar CT image reconstructions and MIPs were obtained to evaluate the vascular anatomy. CONTRAST:  80 mL OMNIPAQUE IOHEXOL 350 MG/ML SOLN COMPARISON:  11/17/2018 and 11/12/2018 FINDINGS: Cardiovascular: Heart is normal size. Small amount of pericardial fluid is present anteriorly. Thoracic aorta demonstrates no evidence of aneurysm or dissection. There is mild calcified plaque over the thoracic aorta. Pulmonary or system is well opacified demonstrates suggestion of small emboli over the proximal left upper lobar pulmonary artery. There is also suggestion of subtle emboli over a subsegmental right middle lobe pulmonary artery. RV/LV ratio 0.96. Remaining vascular structures are unremarkable. Mediastinum/Nodes: 1 cm precarinal lymph node is present. 1.3 cm subcarinal lymph node. No significant hilar adenopathy. Remaining mediastinal structures are unremarkable. Lungs/Pleura: Lungs are adequately inflated demonstrate small bilateral pleural effusions with associated posterior atelectatic change. Subtle patchy density over the right upper lobe with mild nodularity as this could represent infection infection. Airways are unremarkable. Upper Abdomen: No acute findings. Mild calcified plaque over the abdominal aorta. Musculoskeletal: Degenerative change of the spine. Review of the MIP images confirms the above findings. IMPRESSION: 1. Findings suggesting subtle emboli over the proximal left upper lobe pulmonary artery and  subsegmental right middle lobar pulmonary artery. RV/LV ratio slightly elevated at 0.96 indicating mild right heart strain. 2. Small bilateral pleural effusions with associated atelectasis. Mild patchy post density over the right upper lobe with mild nodularity which may be due to infection. Minimal mediastinal adenopathy likely reactive. Recommend follow-up CT 4-6 weeks. 3.  Aortic Atherosclerosis (ICD10-I70.0). Critical Value/emergent results were called by telephone at the time of interpretation on 12/18/2018 at 2:46 pm to providerBOWIE Estiben Mizuno , who verbally acknowledged these results. Electronically Signed   By: Marin Olp M.D.   On: 12/18/2018 14:50   Dg Chest Port 1 View  Result Date: 12/18/2018 CLINICAL DATA:  85 year old female with shortness of breath and cough. EXAM: PORTABLE CHEST 1 VIEW COMPARISON:  Chest CT dated 11/17/2018 and radiograph dated 11/12/2018 FINDINGS: Right-sided PICC with tip at the cavoatrial junction. Left lung base density, likely combination of trace pleural effusion and subsegmental atelectasis/scarring the right lung is clear. There is no pneumothorax. Stable cardiomediastinal silhouette. The aorta is tortuous. There is atherosclerotic calcification of the aorta. No acute osseous pathology. Developing infiltrate is not excluded. Clinical correlation is recommended. IMPRESSION: 1. Right-sided PICC with tip at the cavoatrial junction. No pneumothorax. 2. Left lung base density, likely combination of trace pleural effusion and atelectasis/scarring. Developing infiltrate is not excluded. Electronically Signed   By: Anner Crete M.D.   On: 12/18/2018 11:57    Procedures .Critical Care Performed by: Domenic Moras, PA-C Authorized by: Domenic Moras, PA-C   Critical care provider statement:    Critical care time (minutes):  45   Critical care was time spent personally  by me on the following activities:  Discussions with consultants, evaluation of patient's response to  treatment, examination of patient, ordering and performing treatments and interventions, ordering and review of laboratory studies, ordering and review of radiographic studies, pulse oximetry, re-evaluation of patient's condition, obtaining history from patient or surrogate and review of old charts   (including critical care time)  Medications Ordered in ED Medications  sodium chloride flush (NS) 0.9 % injection 10-40 mL (has no administration in time range)  ceFEPIme (MAXIPIME) 2 g in sodium chloride 0.9 % 100 mL IVPB (has no administration in time range)  vancomycin (VANCOCIN) IVPB 1000 mg/200 mL premix (has no administration in time range)  ceFEPIme (MAXIPIME) 2 g in sodium chloride 0.9 % 100 mL IVPB (0 g Intravenous Stopped 12/18/18 1511)  vancomycin (VANCOCIN) 1,500 mg in sodium chloride 0.9 % 500 mL IVPB (0 mg Intravenous Stopped 12/18/18 1511)  iohexol (OMNIPAQUE) 350 MG/ML injection 80 mL (100 mLs Intravenous Contrast Given 12/18/18 1351)     Initial Impression / Assessment and Plan / ED Course  I have reviewed the triage vital signs and the nursing notes.  Pertinent labs & imaging results that were available during my care of the patient were reviewed by me and considered in my medical decision making (see chart for details).  Clinical Course as of Dec 18 1535  Wed Dec 18, 2018  1527 BP(!): 113/51 [RF]    Clinical Course User Index [RF] Bethena Midget, Student-PA       BP (!) 124/52    Pulse 81    Temp 99.6 F (37.6 C) (Rectal)    Resp 18    Ht 5' 3" (1.6 m)    Wt 75.4 kg Comment: per last visit   SpO2 99%    BMI 29.45 kg/m    Final Clinical Impressions(s) / ED Diagnoses   Final diagnoses:  Other acute pulmonary embolism with acute cor pulmonale (HCC)  Delirium  HCAP (healthcare-associated pneumonia)    ED Discharge Orders    None     11:03 AM Patient who recently discharged from hospital after developing MSSA as well as stroke to a rehab facility.  She is here  due to altered mental status, febrile, and hypoxic.  She is DNR/DNI.  Work-up initiated.  12:13 PM Had the opportunity to talk to patient's son.  Patient sons mentioned that patient has been complaining of pain in her right arm where the PICC line located as well as generalized body aches.  4 days ago she accidentally pulled out her PICC line and requiring replacement of another PICC line.  Send report there was no evidence of any blood once she had her PICC line pulled which makes him think the line was not in place and may contribute to her symptoms.  On exam, she has a triple-lumen PICC line on her left upper arm.  There is some mild skin erythema to her arm and surrounding tissue with tenderness to palpation but the PICC line appears to be in normal function status.  Patient was found to be hypoxic, cough, and a reported fever.  Or repeat COVID-19 testing as well as obtain chest x-ray.  Will activate code sepsis.   12:16 PM Patient is an 94% 2 L of O2.  Initial chest x-ray shows right-sided PICC line with the tip at the top of atrial junction without any evidence of pneumothorax.  There is left lung base density, likely combinations of trace pleural effusion and atelectasis/scarring.  Developing infiltrate is not excluded.  Will obtain chest CT angiogram for further evaluation.  Elevated D-dimer of 3.17.  Mildly elevated lactic acid of 2.0. normal WBC.  Antibiotic including cefepime and vancomycin will be given.    Sepsis reassessment done.   3:37 PM Normal pH, lactic acid improve and normalize.  COVID-19 test is negative.  Hemoglobin is 7.9, near baseline.  A chest CT angiogram demonstrated several emboli over the proximal left upper lobe pulmonary artery and subsegmental right middle lobe pulmonary artery with mild right heart strain.  No evidence of mild patchy post density over the right upper lobe with mild nodularity which may be due to infection.  Patient is currently receiving heparin.   Normal troponin.  She is also receiving IV antibiotic which include cefepime and vancomycin.  Will consult medicine for admission for new PE and possible underlying hospital-acquired pneumonia causing delirium and hypoxia.  4:01 PM Appreciate consultation from [facility] Dr. Tamala Julian who agrees to see and admit pt for further care.    Domenic Moras, PA-C 12/18/18 1603    Virgel Manifold, MD 12/19/18 [phone]

## 2018-12-18 NOTE — Sepsis Progress Note (Signed)
Notified bedside nurse of need to administer antibiotics.  

## 2018-12-18 NOTE — ED Triage Notes (Signed)
From [facility] nursing home has  Had a stroke with deficits  , jhas indewelling foley and pic line ( for endocarditis antibiotics) , per [facility] pt had swelling of left arm and had [place] yesterday  Of that arm with no findings of an issue and also had CXRY yesterday with no findings pt has temp of 102.4

## 2018-12-18 NOTE — H&P (Signed)
History and Physical    Autumn [name] QZE:[id number] DOB: [birth year] DOA: 12/18/2018  Referring MD/NP/PA: Domenic Moras, PA-C PCP: Lajean Manes, MD  Patient coming from: [facility] facility via EMS  Chief Complaint: Altered mental status  I have personally briefly reviewed patient's old medical records in [place]   HPI: Autumn [name] is a 85 y.o. female with medical history significant of hypertension, systolic CHF, hypothyroidism, left bundle branch block, and peripheral neuropathy presenting with complaints of fever and altered mental status. History from the patient is limited due to her current state. She was hospitalized from 9/29-10/13 after initially presenting with complaints of nausea and vomiting.  However, had been found to have acute ischemic left cerebellar CVA, MSSA bacteremia with signs of posterior mitral valve vegetation consistent with endocarditis on TEE from 10/7, as well as acute on chronic CHF with EF decreased down to 25%.  Patient had declined aggressive work-up by cardiology and gastroenterology.  Started on Rocephin by ID to complete treatment on 12/31/2018 for endocarditis.  She was discharged to rehab facility where she been with indwelling Foley in place.  Since the stroke the patient had been basically bedbound.  Facility staff noted decreasing mental status and noted swelling in her right arm PICC line.  They obtain chest x-ray did not ultrasound of the arms which showed no signs of a DVT.  Today patient noted to have fever up to 102.4 F and O2 saturations in the 80s.   ED Course: Upon admission to the emergency department patient was noted to be afebrile, tachypneic, blood pressure maintained, and O2 saturations as low as 78% with improvement on 3 L nasal cannula oxygen greater than 92%.  Labs significant for WBC 8.7, hemoglobin 7.9, sodium 133, potassium 2, procalcitonin 0.13, and D-dimer 3.17.  Urinalysis was positive for  Review of Systems  Unable  to perform ROS: Medical condition    Past Medical History:  Diagnosis Date   Anxiety    GERD (gastroesophageal reflux disease)    Hypertension    Hypothyroid    LBBB (left bundle branch block)    MRSA bacteremia 11/19/2018   Osteopenia    Peripheral neuropathy    Venous insufficiency     Past Surgical History:  Procedure Laterality Date   ABDOMINAL HYSTERECTOMY     BLADDER SURGERY     LEFT HEART CATHETERIZATION WITH CORONARY ANGIOGRAM N/A 09/02/2013   Procedure: LEFT HEART CATHETERIZATION WITH CORONARY ANGIOGRAM;  Surgeon: Minus Breeding, MD;  [facility] C[facility] LAB;  Service: Cardiovascular;  Laterality: N/A;   RENAL ANGIOGRAM N/A 09/02/2013   Procedure: RENAL ANGIOGRAM;  Surgeon: Minus Breeding, MD;  Location: [facility] CATH LAB;  Service: Cardiovascular;  Laterality: N/A;   TEE WITHOUT CARDIOVERSION N/A 11/20/2018   Procedure: TRANSESOPHAGEAL ECHOCARDIOGRAM (TEE);  Surgeon: Dorothy Spark, MD;  Location: [facility] ENDOSCOPY;  Service: Cardiovascular;  Laterality: N/A;     reports that she has never smoked. She has never used smokeless tobacco. She reports current alcohol use. She reports that she does not use drugs.  Allergies  Allergen Reactions   Chlorhexidine    Hydrochlorothiazide Other (See Comments)    REACTION: hyponatremia   Ramipril Other (See Comments)    REACTION: GI upset   Remeron [Mirtazapine] Other (See Comments)    REACTION: weakness   Sulfa Antibiotics Rash    Family History  Problem Relation Age of Onset   Hypertension Mother    Prostate cancer Father     Prior to Admission  medications   Medication Sig Start Date End Date Taking? Authorizing Provider  Ascorbic Acid (VITAMIN C PO) Take 1 tablet by mouth daily.    [provider]  aspirin 325 MG tablet Take 1 tablet (325 mg total) by mouth daily. 11/15/18   Kayleen Memos, DO  carvedilol (COREG) 12.5 MG tablet Take 1 tablet (12.5 mg total) by mouth 2 (two) times daily. 11/15/18    Kayleen Memos, DO  cefTRIAXone (ROCEPHIN) IVPB Inject 2 g into the vein every 12 (twelve) hours. Indication: Mitral valve endocarditis with septic embolization Last Day of Therapy:  12/31/2018 Labs - Once weekly:  CBC/D and BMP, Labs - Every other week:  ESR and CRP 11/26/18 12/31/18  Shelly Coss, MD  Cholecalciferol (VITAMIN D PO) Take 1 tablet by mouth daily.    [provider]  Cyanocobalamin (VITAMIN B-12 PO) Take 1 tablet by mouth daily.    [provider]  diclofenac sodium (VOLTAREN) 1 % GEL Apply 4 g topically 4 (four) times daily. Patient taking differently: Apply 4 g topically 4 (four) times daily as needed (pain).  10/02/18   Blanchie Dessert, MD  estradiol (ESTRACE) 1 MG tablet Take 1 mg by mouth daily.    [provider]  feeding supplement, ENSURE ENLIVE, (ENSURE ENLIVE) LIQD Take 237 mLs by mouth 3 (three) times daily between meals. 11/28/18   Eugenie Filler, MD  levothyroxine (SYNTHROID, LEVOTHROID) 50 MCG tablet Take 50 mcg by mouth daily before breakfast.    [provider]  losartan (COZAAR) 100 MG tablet Take 1 tablet (100 mg total) by mouth daily. 11/15/18   Kayleen Memos, DO  MELATONIN PO Take 1 mL by mouth at bedtime.    [provider]  Multiple Vitamins-Minerals (HAIR SKIN AND NAILS FORMULA PO) Take 1 tablet by mouth daily.    [provider]  pantoprazole (PROTONIX) 40 MG tablet Take 1 tablet (40 mg total) by mouth 2 (two) times daily. Take 1 tablet 40 mg BID x 6 weeks then 1 tab 40 mg daily afterwards 11/15/18   Kayleen Memos, DO  senna-docusate (SENOKOT-S) 8.6-50 MG tablet Take 2 tablets by mouth 2 (two) times daily. 11/17/18   Kayleen Memos, DO  traMADol (ULTRAM) 50 MG tablet Take 1 tablet (50 mg total) by mouth daily as needed for severe pain. 11/17/18   Kayleen Memos, DO  TURMERIC CURCUMIN PO Take 1 tablet by mouth daily.     [provider]    Physical Exam:  Constitutional: Elderly female  who appears chronically ill Vitals:   12/18/18 1221 12/18/18 1259 12/18/18 1524 12/18/18 1530  BP:  (!) 139/51 (!) 113/51 (!) 124/52  Pulse: 62 78 77 81  Resp: (!) '24  14 18  ' Temp:      TempSrc:      SpO2: 100% 98% 96% 99%  Weight:      Height:       Eyes: PERRL, lids and conjunctivae normal ENMT: Mucous membranes are moist. Posterior pharynx clear of any exudate or lesions.  Neck: normal, supple, no masses, no thyromegaly Respiratory: Normal respiratory effort currently on 3 L nasal cannula oxygen Cardiovascular: Regular rate and rhythm, no murmurs / rubs / gallops. No extremity edema. 2+ pedal pulses. No carotid bruits.  PICC line present of the right upper extremity Abdomen: no tenderness, no masses palpated. No hepatosplenomegaly. Bowel sounds positive.  Musculoskeletal: no clubbing / cyanosis. No joint deformity upper and lower extremities. Good ROM,  no contractures. Normal muscle tone.  Skin: Increased redness noted of the right arm with some swelling. Neurologic: CN 2-12 grossly intact. Sensation intact, DTR normal. Strength 5/5 in all 4.  Psychiatric: Alert and oriented x person and place.  Responded that she was unsure where she was in the hospital  Labs on Admission: I have personally reviewed following labs and imaging studies  CBC: Recent Labs  Lab 12/18/18 1056 12/18/18 1251  WBC 8.7  --   NEUTROABS 5.6  --   HGB 7.9* 7.5*  HCT 24.9* 22.0*  MCV 105.1*  --   PLT 261  --    Basic Metabolic Panel: Recent Labs  Lab 12/18/18 1056 12/18/18 1251  NA 133* 133*  K 4.3 4.5  CL 102  --   CO2 22  --   GLUCOSE 151*  --   BUN 9  --   CREATININE 0.85  --   CALCIUM 8.0*  --    GFR: Estimated Creatinine Clearance: 47.9 mL/min (by C-G formula based on SCr of 0.85 mg/dL). Liver Function Tests: Recent Labs  Lab 12/18/18 1056  AST 30  ALT 19  ALKPHOS 105  BILITOT 0.1*  PROT 4.6*  ALBUMIN 1.6*   No results for input(s): LIPASE, AMYLASE in the last 168 hours. No  results for input(s): AMMONIA in the last 168 hours. Coagulation Profile: Recent Labs  Lab 12/18/18 1056  INR 1.2   Cardiac Enzymes: No results for input(s): CKTOTAL, CKMB, CKMBINDEX, TROPONINI in the last 168 hours. BNP (last 3 results) No results for input(s): PROBNP in the last 8760 hours. HbA1C: No results for input(s): HGBA1C in the last 72 hours. CBG: No results for input(s): GLUCAP in the last 168 hours. Lipid Profile: No results for input(s): CHOL, HDL, LDLCALC, TRIG, CHOLHDL, LDLDIRECT in the last 72 hours. Thyroid Function Tests: No results for input(s): TSH, T4TOTAL, FREET4, T3FREE, THYROIDAB in the last 72 hours. Anemia Panel: No results for input(s): VITAMINB12, FOLATE, FERRITIN, TIBC, IRON, RETICCTPCT in the last 72 hours. Urine analysis:    Component Value Date/Time   COLORURINE YELLOW 12/18/2018 1520   APPEARANCEUR CLEAR 12/18/2018 1520   LABSPEC 1.029 12/18/2018 1520   PHURINE 7.0 12/18/2018 1520   GLUCOSEU NEGATIVE 12/18/2018 1520   HGBUR NEGATIVE 12/18/2018 1520   BILIRUBINUR NEGATIVE 12/18/2018 1520   KETONESUR NEGATIVE 12/18/2018 1520   PROTEINUR NEGATIVE 12/18/2018 1520   NITRITE NEGATIVE 12/18/2018 1520   LEUKOCYTESUR TRACE (A) 12/18/2018 1520   Sepsis Labs: Recent Results (from the past 240 hour(s))  SARS Coronavirus 2 by RT PCR (hospital order, performed in [place] hospital lab) Nasopharyngeal Nasopharyngeal Swab     Status: None   Collection Time: 12/18/18 12:13 PM   Specimen: Nasopharyngeal Swab  Result Value Ref Range Status   SARS Coronavirus 2 NEGATIVE NEGATIVE Final    Comment: (NOTE) If result is NEGATIVE SARS-CoV-2 target nucleic acids are NOT DETECTED. The SARS-CoV-2 RNA is generally detectable in upper and lower  respiratory specimens during the acute phase of infection. The lowest  concentration of SARS-CoV-2 viral copies this assay can detect is 250  copies / mL. A negative result does not preclude SARS-CoV-2 infection  and  should not be used as the sole basis for treatment or other  patient management decisions.  A negative result may occur with  improper specimen collection / handling, submission of specimen other  than nasopharyngeal swab, presence of viral mutation(s) within the  areas targeted by this assay, and inadequate number of  viral copies  (<250 copies / mL). A negative result must be combined with clinical  observations, patient history, and epidemiological information. If result is POSITIVE SARS-CoV-2 target nucleic acids are DETECTED. The SARS-CoV-2 RNA is generally detectable in upper and lower  respiratory specimens dur ing the acute phase of infection.  Positive  results are indicative of active infection with SARS-CoV-2.  Clinical  correlation with patient history and other diagnostic information is  necessary to determine patient infection status.  Positive results do  not rule out bacterial infection or co-infection with other viruses. If result is PRESUMPTIVE POSTIVE SARS-CoV-2 nucleic acids MAY BE PRESENT.   A presumptive positive result was obtained on the submitted specimen  and confirmed on repeat testing.  While 2019 novel coronavirus  (SARS-CoV-2) nucleic acids may be present in the submitted sample  additional confirmatory testing may be necessary for epidemiological  and / or clinical management purposes  to differentiate between  SARS-CoV-2 and other Sarbecovirus currently known to infect humans.  If clinically indicated additional testing with an alternate test  methodology [phone]) is advised. The SARS-CoV-2 RNA is generally  detectable in upper and lower respiratory sp ecimens during the acute  phase of infection. The expected result is Negative. Fact Sheet for Patients:  StrictlyIdeas.no Fact Sheet for Healthcare Providers: BankingDealers.co.za This test is not yet approved or cleared by the [place] FDA and has been  authorized for detection and/or diagnosis of SARS-CoV-2 by FDA under an Emergency Use Authorization (EUA).  This EUA will remain in effect (meaning this test can be used) for the duration of the COVID-19 declaration under Section 564(b)(1) of the Act, 21 U.S.C. section 360bbb-3(b)(1), unless the authorization is terminated or revoked sooner. Performed at [facility], [place] [address] [place], [place] [address]      Radiological Exams on Admission: Ct Angio Chest Pe W And/or Wo Contrast  Result Date: 12/18/2018 CLINICAL DATA:  Shortness of breath. EXAM: CT ANGIOGRAPHY CHEST WITH CONTRAST TECHNIQUE: Multidetector CT imaging of the chest was performed using the standard protocol during bolus administration of intravenous contrast. Multiplanar CT image reconstructions and MIPs were obtained to evaluate the vascular anatomy. CONTRAST:  80 mL OMNIPAQUE IOHEXOL 350 MG/ML SOLN COMPARISON:  11/17/2018 and 11/12/2018 FINDINGS: Cardiovascular: Heart is normal size. Small amount of pericardial fluid is present anteriorly. Thoracic aorta demonstrates no evidence of aneurysm or dissection. There is mild calcified plaque over the thoracic aorta. Pulmonary or system is well opacified demonstrates suggestion of small emboli over the proximal left upper lobar pulmonary artery. There is also suggestion of subtle emboli over a subsegmental right middle lobe pulmonary artery. RV/LV ratio 0.96. Remaining vascular structures are unremarkable. Mediastinum/Nodes: 1 cm precarinal lymph node is present. 1.3 cm subcarinal lymph node. No significant hilar adenopathy. Remaining mediastinal structures are unremarkable. Lungs/Pleura: Lungs are adequately inflated demonstrate small bilateral pleural effusions with associated posterior atelectatic change. Subtle patchy density over the right upper lobe with mild nodularity as this could represent infection infection. Airways are unremarkable. Upper Abdomen: No acute findings.  Mild calcified plaque over the abdominal aorta. Musculoskeletal: Degenerative change of the spine. Review of the MIP images confirms the above findings. IMPRESSION: 1. Findings suggesting subtle emboli over the proximal left upper lobe pulmonary artery and subsegmental right middle lobar pulmonary artery. RV/LV ratio slightly elevated at 0.96 indicating mild right heart strain. 2. Small bilateral pleural effusions with associated atelectasis. Mild patchy post density over the right upper lobe with mild nodularity which may  be due to infection. Minimal mediastinal adenopathy likely reactive. Recommend follow-up CT 4-6 weeks. 3.  Aortic Atherosclerosis (ICD10-I70.0). Critical Value/emergent results were called by telephone at the time of interpretation on 12/18/2018 at 2:46 pm to providerBOWIE TRAN , who verbally acknowledged these results. Electronically Signed   By: Marin Olp M.D.   On: 12/18/2018 14:50   Dg Chest Port 1 View  Result Date: 12/18/2018 CLINICAL DATA:  85 year old female with shortness of breath and cough. EXAM: PORTABLE CHEST 1 VIEW COMPARISON:  Chest CT dated 11/17/2018 and radiograph dated 11/12/2018 FINDINGS: Right-sided PICC with tip at the cavoatrial junction. Left lung base density, likely combination of trace pleural effusion and subsegmental atelectasis/scarring the right lung is clear. There is no pneumothorax. Stable cardiomediastinal silhouette. The aorta is tortuous. There is atherosclerotic calcification of the aorta. No acute osseous pathology. Developing infiltrate is not excluded. Clinical correlation is recommended. IMPRESSION: 1. Right-sided PICC with tip at the cavoatrial junction. No pneumothorax. 2. Left lung base density, likely combination of trace pleural effusion and atelectasis/scarring. Developing infiltrate is not excluded. Electronically Signed   By: Anner Crete M.D.   On: 12/18/2018 11:57    EKG: Independently reviewed.  Sinus rhythm at 79 bpm with left  bundle branch block  Assessment/Plan Sepsis secondary to possible healthcare associated pneumonia: Acute.  Patient presents with reports of fever 102.4 F, tachypnea, hypoxia, and lactic acid of 2.  Procalcitonin only 0.13.  Imaging showing possible right middle lobe opacities concerning for infection.  Patient had already been on IV antibiotics of Rocephin for treatment of MSSA bacteremia.  Arm where PICC line is placed was noted to be red and possibly infected concerning for possible cellulitis. -Admit to a stepdown bed -Follow-up blood, urine, and sputum cultures -Check respiratory virus panel -Remove PICC line -Continue empiric antibiotics of vancomycin and cefepime -Discussed case with Dr. Tommy Medal of ID  Pulmonary emboli: Acute.  Patient found to have elevated D-dimer.  CT angiogram of the chest revealed subtle pulmonary emboli of the right and left lungs with mild right heart strain. -Heparin per pharmacy -If signs of bleeding appreciated will need to call interventional radiology for  IVC filter  Acute respiratory failure with hypoxia: Patient's O2 saturations were initially noted to be in 80s.  Suspect secondary to acute pulmonary emboli and/or pneumonia. -Continuous pulse oximetry with nasal cannula oxygen as needed  Acute encephalopathy: Suspect likely related to above. -Neuro checks  Combined systolic and diastolic congestive heart failure: Patient appears to be relatively euvolemic at this time.  Imaging did show signs of mild bilateral pleural effusions.  Last EF noted to be around 25%. -Strict intake and output -Daily weights -Add on BMP  -Give 20 mg of Lasix on start of blood transfusion -Continue to monitor for signs of fluid overload  Recent endocarditis with MSSA bacteremia: Patient was supposed to be on continue on Rocephin until 11/17.  ID did not recommend culturing the tip of the catheter. -Discontinued Rocephin and PICC line  Macrocytic anemia: Acute.  Hemoglobin  7.9 g/dL on admission and repeat check 7.5 g/dL.  No reports of bleeding reported but hemoglobin previously noted to be around 12.1 in October and had slowly been trending down. -Check stool guaiac -Transfuse 1 unit of packed red blood cells -Continue to monitor and replace as needed   DVT prophylaxis: heparin   Code Status: DNR  Family Communication: No family present at bedside  Disposition Plan: Likely discharge back to rehab once medically stable Consults  called: ID Admission status: Inpatient   Norval Morton MD [facility] Pager [phone]   If 7PM-7AM, please contact night-coverage [email or web address] Password [facility]  12/18/2018, 3:54 PM

## 2018-12-18 NOTE — Progress Notes (Signed)
Pharmacy Antibiotic Note  Autumn [name] is a 85 y.o. female admitted on 12/18/2018 with fever and AMS.  Pharmacy has been consulted for vancomycin and cefepime dosing. Pt is afebrile and WBC is WNL. SCr is WNL and lactic acid is elevated at 2. Pt was ceftriaxone PTA for history of MSSA endocarditis. Initially scheduled through 11/17.  Plan: Vancomycin 1500mg  IV x  1 then 1gm IV Q24H Cefepime 2gm IV Q12H F/u renal fxn, C&S, clinical status and peak/trough at SS  Height: 5\' 3"  (160 cm) Weight: 166 lb 3.6 oz (75.4 kg)(per last visit) IBW/kg (Calculated) : 52.4  Temp (24hrs), Avg:99.6 F (37.6 C), Min:99.6 F (37.6 C), Max:99.6 F (37.6 C)  Recent Labs  Lab 12/18/18 1056  WBC 8.7  CREATININE 0.85  LATICACIDVEN 2.0*    Estimated Creatinine Clearance: 47.9 mL/min (by C-G formula based on SCr of 0.85 mg/dL).    Allergies  Allergen Reactions  . Chlorhexidine   . Hydrochlorothiazide Other (See Comments)    REACTION: hyponatremia  . Ramipril Other (See Comments)    REACTION: GI upset  . Remeron [Mirtazapine] Other (See Comments)    REACTION: weakness  . Sulfa Antibiotics Rash    Antimicrobials this admission: Vanc 11/4>> Cefepime 11/4>> CTX PTA  Dose adjustments this admission: N/A  Microbiology results: Pending  Thank you for allowing pharmacy to be a part of this patient's care.  Mlissa Tamayo, Rande Lawman 12/18/2018 11:07 AM

## 2018-12-18 NOTE — Progress Notes (Signed)
ANTICOAGULATION CONSULT NOTE - Initial Consult  Pharmacy Consult for heparin Indication: pulmonary embolus  Allergies  Allergen Reactions  . Chlorhexidine   . Hydrochlorothiazide Other (See Comments)    REACTION: hyponatremia  . Ramipril Other (See Comments)    REACTION: GI upset  . Remeron [Mirtazapine] Other (See Comments)    REACTION: weakness  . Sulfa Antibiotics Rash    Patient Measurements: Height: 5\' 3"  (160 cm) Weight: 166 lb 3.6 oz (75.4 kg)(per last visit) IBW/kg (Calculated) : 52.4 Heparin Dosing Weight: 68.5 kg  Vital Signs: Temp: 99.6 F (37.6 C) (11/04 1111) Temp Source: Rectal (11/04 1111) BP: 124/52 (11/04 1530) Pulse Rate: 81 (11/04 1530)  Labs: Recent Labs    12/18/18 1056 12/18/18 1215 12/18/18 1251  HGB 7.9*  --  7.5*  HCT 24.9*  --  22.0*  PLT 261  --   --   APTT 41*  --   --   LABPROT 15.1  --   --   INR 1.2  --   --   CREATININE 0.85  --   --   TROPONINIHS  --  8  --     Estimated Creatinine Clearance: 47.9 mL/min (by C-G formula based on SCr of 0.85 mg/dL).   Medical History: Past Medical History:  Diagnosis Date  . Anxiety   . GERD (gastroesophageal reflux disease)   . Hypertension   . Hypothyroid   . LBBB (left bundle branch block)   . MRSA bacteremia 11/19/2018  . Osteopenia   . Peripheral neuropathy   . Venous insufficiency     Medications:  (Not in a hospital admission)   Assessment: 85 yo female admitted on 12/18/2018 for fever and AMS. Pharmacy consulted to dose heparin for pulmonary embolism. CTA found subtle emboli. Patient does not appear to be on anticoagulation prior to admission (medication reconciliation pending). No reported bleeding. Hgb 7.5. Plt 261.    Goal of Therapy:  Heparin level 0.3-0.7 units/ml Monitor platelets by anticoagulation protocol: Yes   Plan:  Heparin 5000 units x1 Heparin 1200 units/hr Check heparin level at 0000 on 11/5 Monitor heparin level, CBC, and S/S of bleeding daily   Cristela Felt, PharmD PGY1 Pharmacy Resident [employer]: [phone]   12/18/2018,3:38 PM

## 2018-12-18 NOTE — ED Notes (Signed)
IV team here to acess PIC line, pt hadbig poop  Pt cleaned and  Changed rectal temp 99.9

## 2018-12-19 ENCOUNTER — Inpatient Hospital Stay (HOSPITAL_COMMUNITY): Payer: Medicare Other

## 2018-12-19 ENCOUNTER — Other Ambulatory Visit: Payer: Self-pay

## 2018-12-19 ENCOUNTER — Encounter (HOSPITAL_COMMUNITY): Payer: Self-pay | Admitting: General Practice

## 2018-12-19 DIAGNOSIS — L8990 Pressure ulcer of unspecified site, unspecified stage: Secondary | ICD-10-CM | POA: Insufficient documentation

## 2018-12-19 DIAGNOSIS — I340 Nonrheumatic mitral (valve) insufficiency: Secondary | ICD-10-CM

## 2018-12-19 DIAGNOSIS — I2699 Other pulmonary embolism without acute cor pulmonale: Secondary | ICD-10-CM

## 2018-12-19 LAB — BPAM RBC
Blood Product Expiration Date: 202011302359
ISSUE DATE / TIME: 202011041948
Unit Type and Rh: 6200

## 2018-12-19 LAB — BASIC METABOLIC PANEL
Anion gap: 10 (ref 5–15)
BUN: 10 mg/dL (ref 8–23)
CO2: 22 mmol/L (ref 22–32)
Calcium: 8.2 mg/dL — ABNORMAL LOW (ref 8.9–10.3)
Chloride: 100 mmol/L (ref 98–111)
Creatinine, Ser: 0.75 mg/dL (ref 0.44–1.00)
GFR calc Af Amer: >60 mL/min (ref >60)
GFR calc non Af Amer: >60 mL/min (ref >60)
Glucose, Bld: 98 mg/dL (ref 70–99)
Potassium: 3.9 mmol/L (ref 3.5–5.1)
Sodium: 132 mmol/L — ABNORMAL LOW (ref 135–145)

## 2018-12-19 LAB — TYPE AND SCREEN
ABO/RH(D): A POS
Antibody Screen: NEGATIVE
Unit division: 0

## 2018-12-19 LAB — ECHOCARDIOGRAM LIMITED
Height: 63 in
Weight: 2659.6 oz

## 2018-12-19 LAB — CBC
HCT: 30.7 % — ABNORMAL LOW (ref 36.0–46.0)
Hemoglobin: 10 g/dL — ABNORMAL LOW (ref 12.0–15.0)
MCH: 31.3 pg (ref 26.0–34.0)
MCHC: 32.6 g/dL (ref 30.0–36.0)
MCV: 96.2 fL (ref 80.0–100.0)
Platelets: 282 10*3/uL (ref 150–400)
RBC: 3.19 MIL/uL — ABNORMAL LOW (ref 3.87–5.11)
RDW: 17.8 % — ABNORMAL HIGH (ref 11.5–15.5)
WBC: 11.6 10*3/uL — ABNORMAL HIGH (ref 4.0–10.5)
nRBC: 0 % (ref 0.0–0.2)

## 2018-12-19 LAB — URINE CULTURE: Culture: 20000 — AB

## 2018-12-19 LAB — HEPARIN LEVEL (UNFRACTIONATED)
Heparin Unfractionated: 0.96 IU/mL — ABNORMAL HIGH (ref 0.30–0.70)
Heparin Unfractionated: 0.88 IU/mL — ABNORMAL HIGH (ref 0.30–0.70)
Heparin Unfractionated: 0.69 IU/mL (ref 0.30–0.70)
Heparin Unfractionated: 0.44 IU/mL (ref 0.30–0.70)

## 2018-12-19 IMAGING — MR MR HEAD W/O CM
14 of 26 series · 29 of 48 positions shown · non-contrast
Comparison: MRI head [DATE]

CLINICAL DATA: Altered mental status unclear etiology

EXAM:
MRI HEAD WITHOUT CONTRAST
TECHNIQUE: Multiplanar, multiecho pulse sequences of the brain and surrounding
structures were obtained without intravenous contrast.

[Series 2: DWI · axial · 3.0mm · 0.94mm/px · z∈[-87,+51]mm · 6 of 94 slices shown (1 of 2)]
[im 1/94]
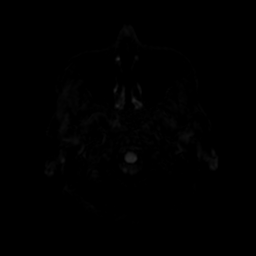
[im 19/94]
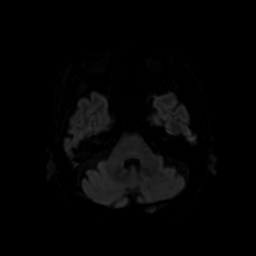
[im 38/94]
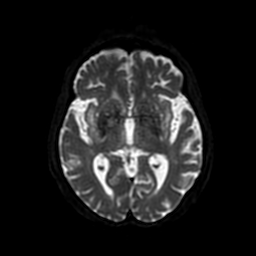
[im 56/94]
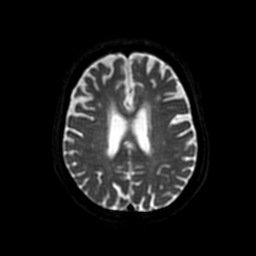
[im 75/94]
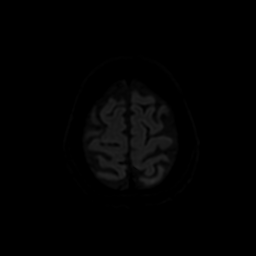
[im 94/94]
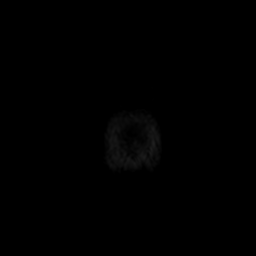

[Series 3: DWI · coronal · 4.0mm · 0.94mm/px · 4 of 68 slices shown (2 of 2)]
[im 1/68]
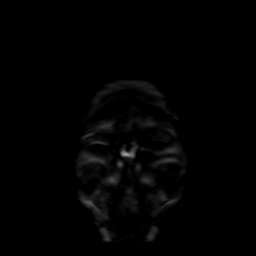
[im 23/68]
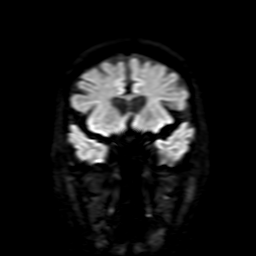
[im 45/68]
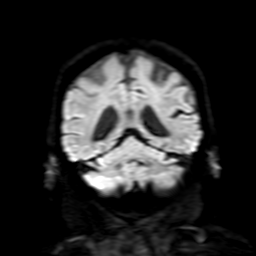
[im 68/68]
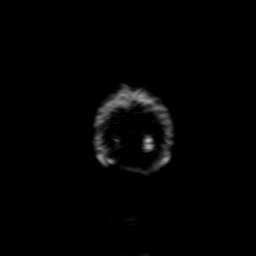

[Series 4: FLAIR · sagittal · 5.0mm · 0.43mm/px · 1 of 25 slices shown (1 of 2)]
[im 1/25]
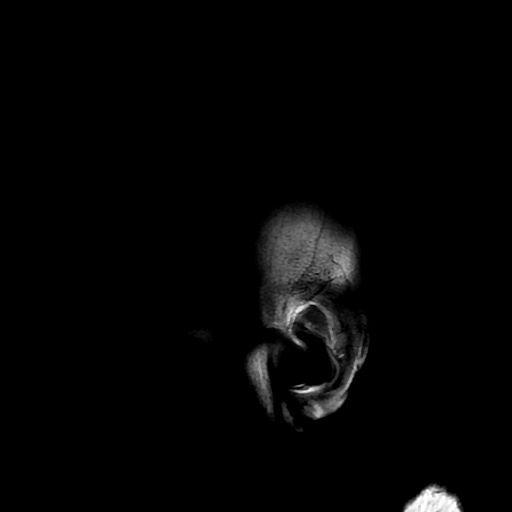

[Series 5: T2 · axial · 5.0mm · 0.43mm/px · 1 of 27 slices shown (1 of 7)]
[im 1/27]
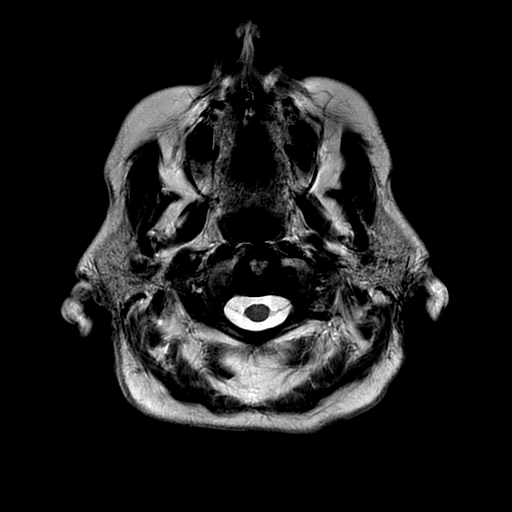

[Series 6: FLAIR · axial · 3.0mm · 0.43mm/px · 1 of 27 slices shown (2 of 2)]
[im 1/27]
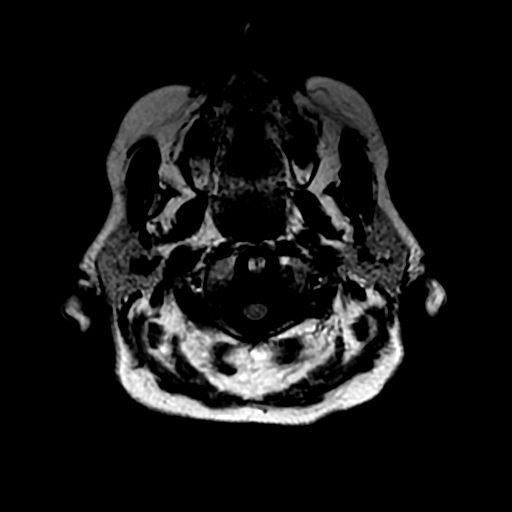

[Series 7: SWI · axial · 3.0mm · 0.43mm/px · z∈[-90,+52]mm · 5 of 96 slices shown]
[im 1/96]
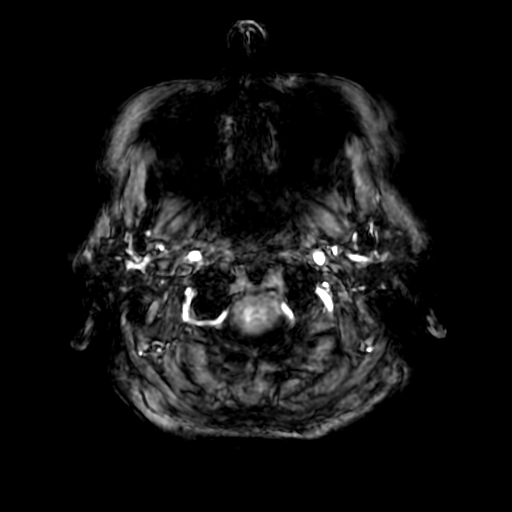
[im 24/96]
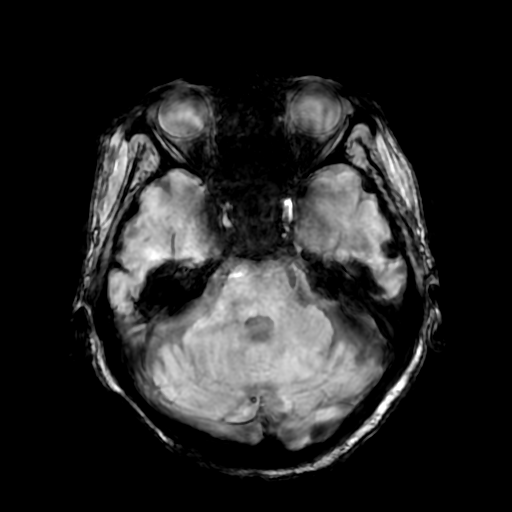
[im 48/96]
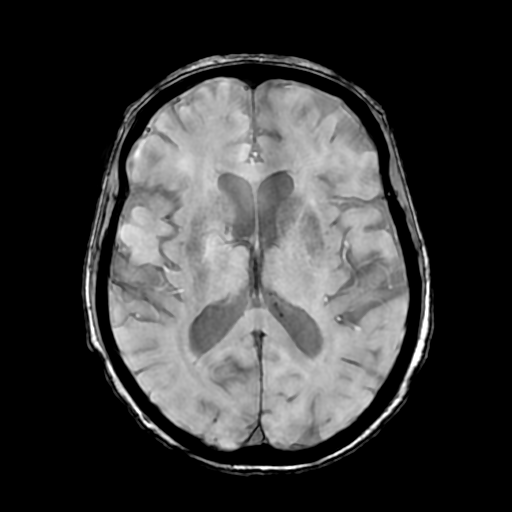
[im 72/96]
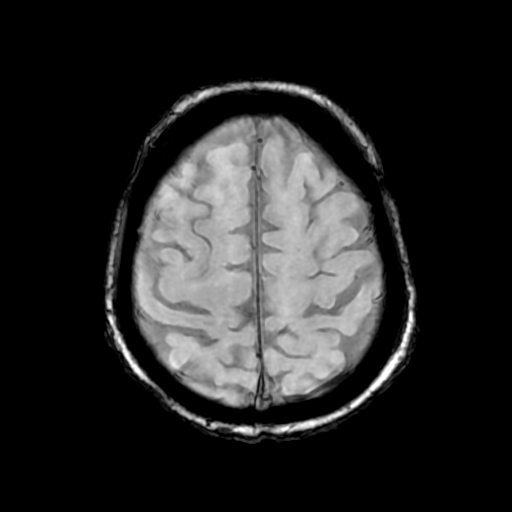
[im 96/96]
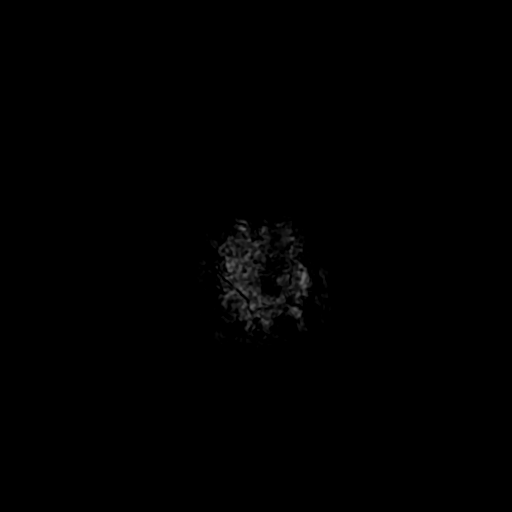

[Series 9: T2 · coronal · 5.0mm · 0.39mm/px · 1 of 28 slices shown (2 of 7)]
[im 1/28]
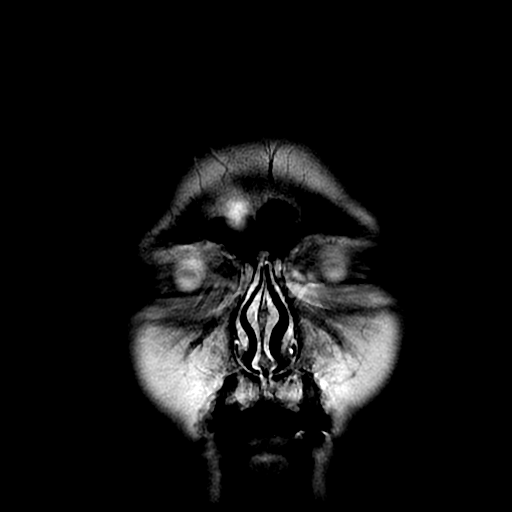

[Series 12: T2 · sagittal · 3.0mm · 0.59mm/px · 1 of 15 slices shown (3 of 7)]
[im 1/15]
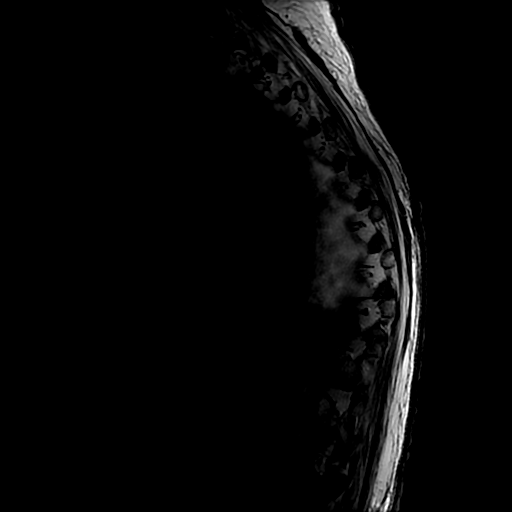

[Series 15: T2 · axial · 4.0mm · 0.39mm/px · 1 of 27 slices shown (4 of 7)]
[im 1/27]
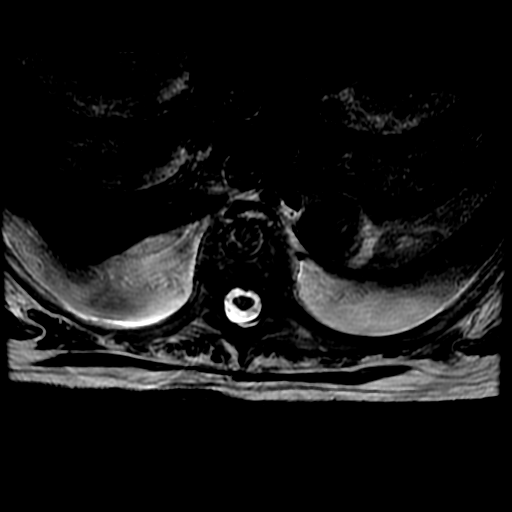

[Series 16: T2 · axial · 4.0mm · 0.39mm/px · 1 of 29 slices shown (5 of 7)]
[im 1/29]
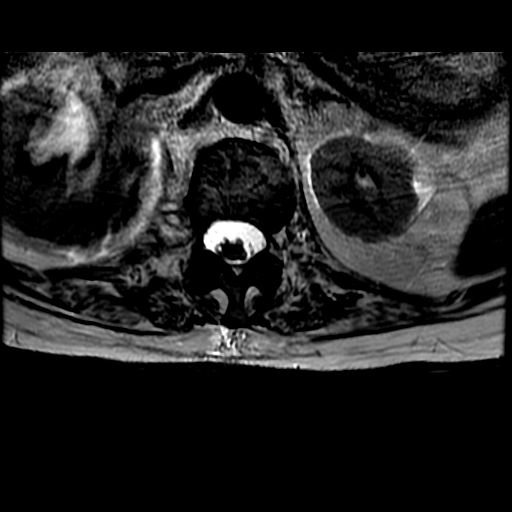

[Series 22: T2 · sagittal · 4.0mm · 0.55mm/px · 1 of 14 slices shown (6 of 7)]
[im 1/14]
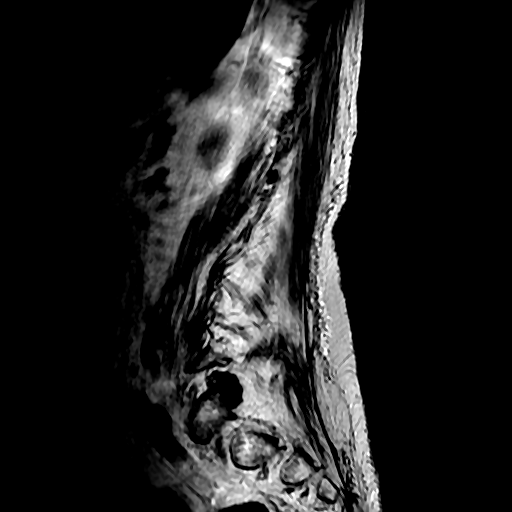

[Series 25: T2 · axial · 4.0mm · 0.39mm/px · z∈[-601,-416]mm · 2 of 38 slices shown (7 of 7)]
[im 1/38]
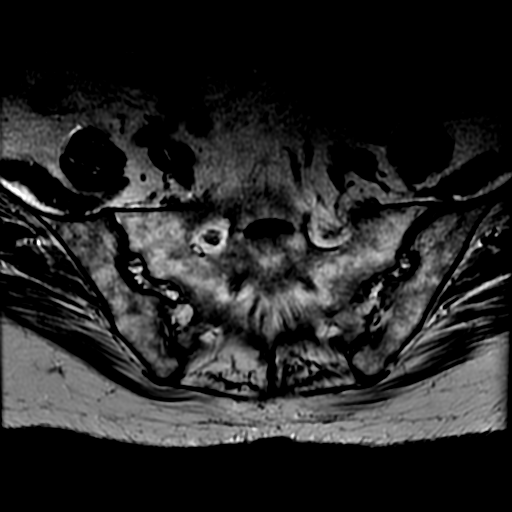
[im 38/38]
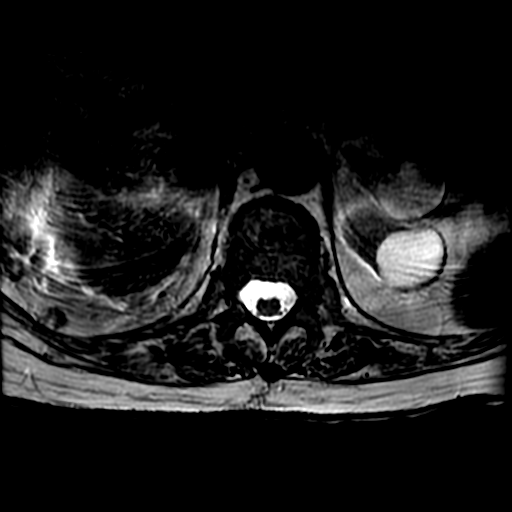

[Series 250: ADC · axial · 3.0mm · 0.94mm/px · z∈[-87,+51]mm · 2 of 46 slices shown (1 of 2)]
[im 1/46]
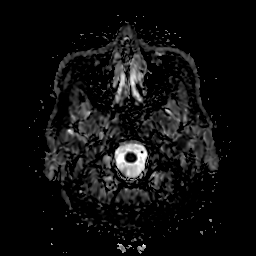
[im 46/46]
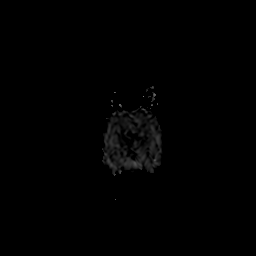

[Series 350: ADC · coronal · 4.0mm · 0.94mm/px · 2 of 34 slices shown (2 of 2)]
[im 1/34]
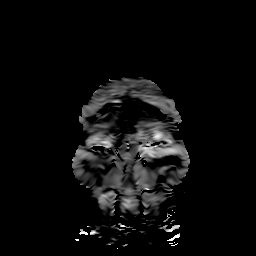
[im 34/34]
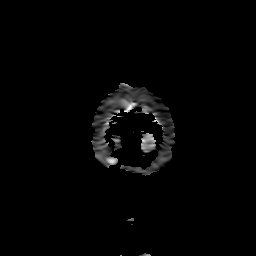

[29 of 48 positions shown; findings below may reference images not displayed]

FINDINGS: Brain: Small areas of acute infarct in the thalamus bilaterally have
developed since the prior study. Small acute infarct left frontal
white matter. Small acute infarct right cerebellum also new.

Generalized atrophy with chronic microvascular ischemic change in
the white matter. Chronic microhemorrhage left cerebellar tonsil and
left thalamus. Negative for mass or midline shift.

Vascular: Normal arterial flow voids.

Skull and upper cervical spine: No acute skeletal abnormality. C1-2
arthropathy with pannus posterior to the dens. No significant spinal
stenosis. Mild anterolisthesis C2-3.

Sinuses/Orbits: Paranasal sinuses clear. Right mastoid effusion. No
orbital lesion.

Other: None
IMPRESSION: Compared with the recent MRI of [DATE], new areas of acute
infarct are noted in the thalamus bilaterally, left frontal white
matter, and right lateral cerebellum.

## 2018-12-19 IMAGING — MR MR THORACIC SPINE W/O CM
4 of 8 series · 17 of 48 positions shown · non-contrast
Comparison: MRI thoracic and lumbar spine [DATE]

CLINICAL DATA: Back pain.  Endocarditis.

EXAM:
MRI THORACIC AND LUMBAR SPINE WITHOUT CONTRAST
TECHNIQUE: Multiplanar and multiecho pulse sequences of the thoracic and lumbar
spine were obtained without intravenous contrast.

[Series 19: T1 · axial · non-contrast · 4.0mm · 0.39mm/px · z∈[-302,-196]mm · 3 of 27 slices shown (1 of 2)]
[im 6/27]
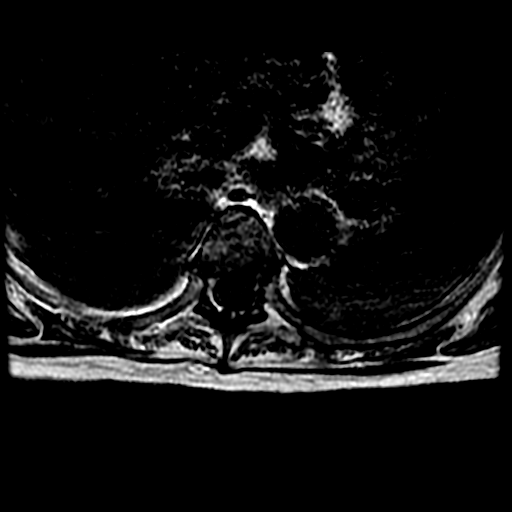
[im 16/27]
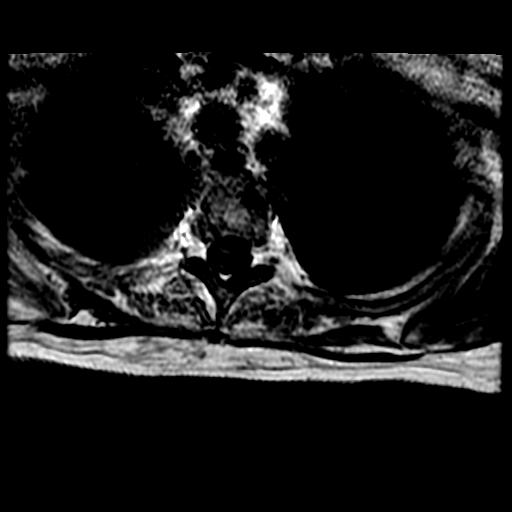
[im 27/27]
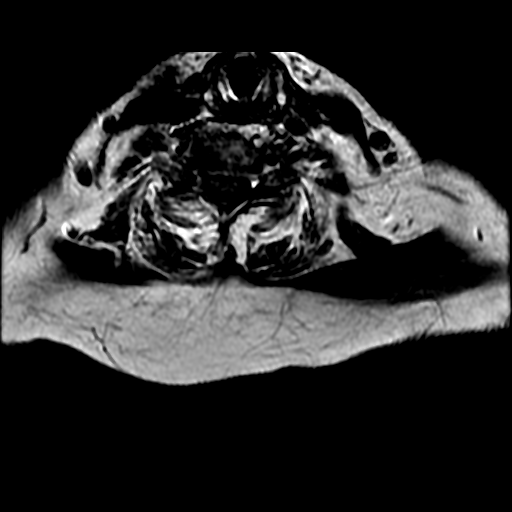

[Series 20: T1 · axial · non-contrast · 4.0mm · 0.39mm/px · z∈[-418,-314]mm · 3 of 29 slices shown (2 of 2)]
[im 5/29]
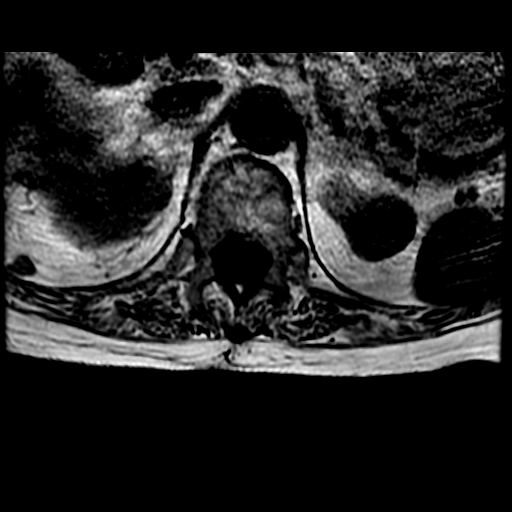
[im 15/29]
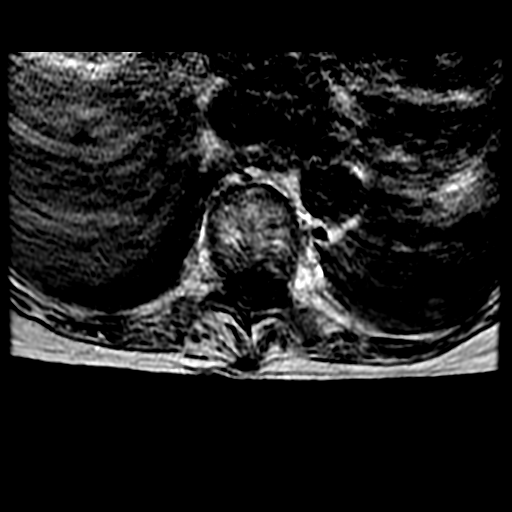
[im 24/29]
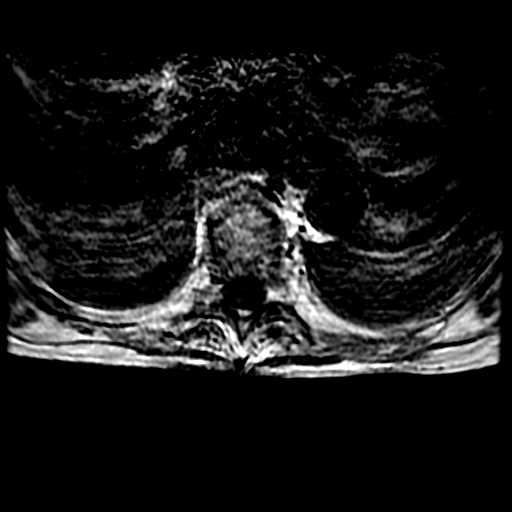

[Series 22: T2 · sagittal · 4.0mm · 0.55mm/px · 3 of 14 slices shown (1 of 2)]
[im 1/14]
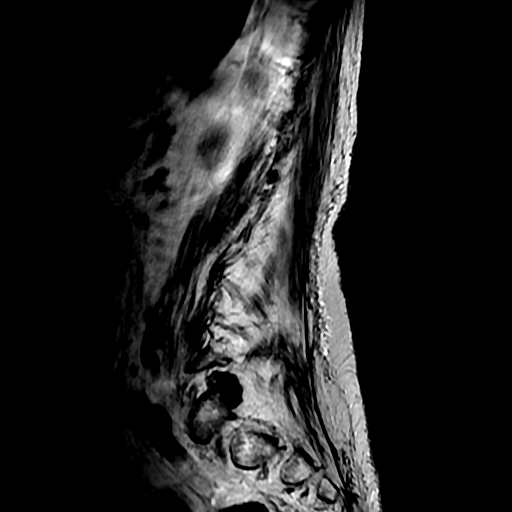
[im 7/14]
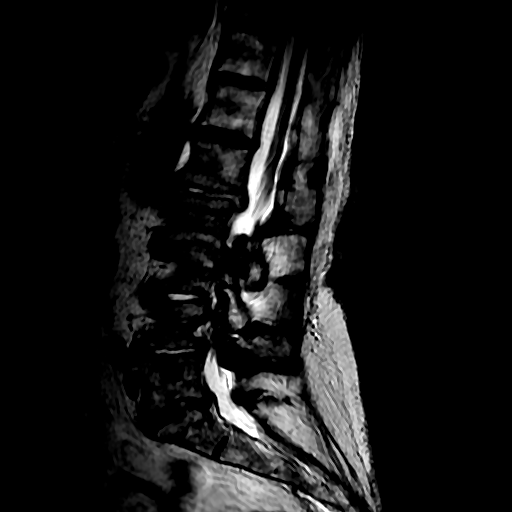
[im 14/14]
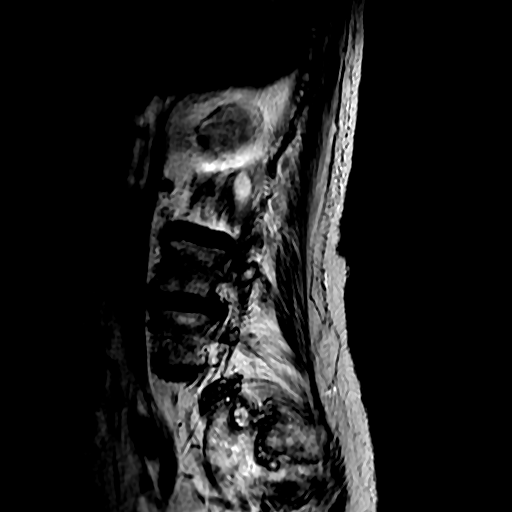

[Series 25: T2 · axial · 4.0mm · 0.39mm/px · z∈[-601,-441]mm · 8 of 38 slices shown (2 of 2)]
[im 1/38]
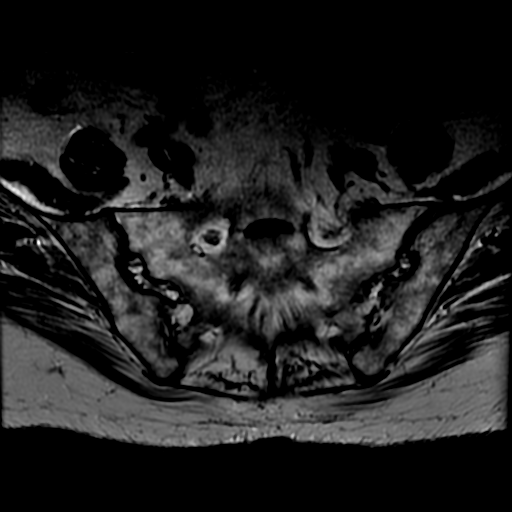
[im 5/38]
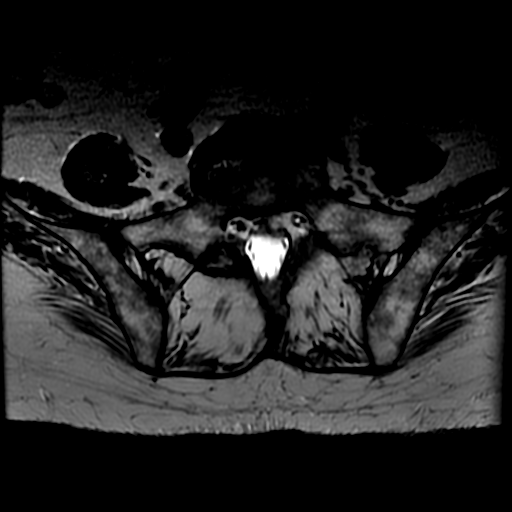
[im 10/38]
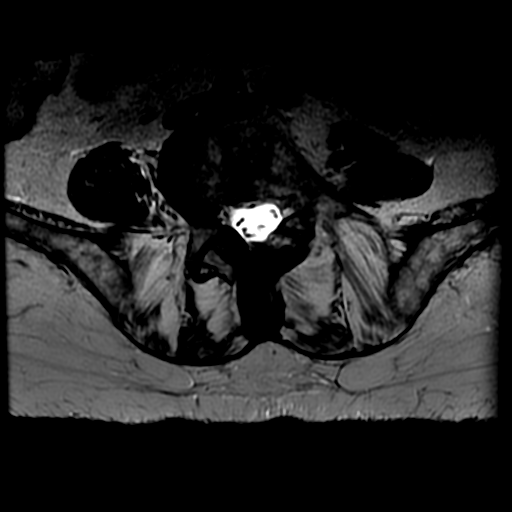
[im 14/38]
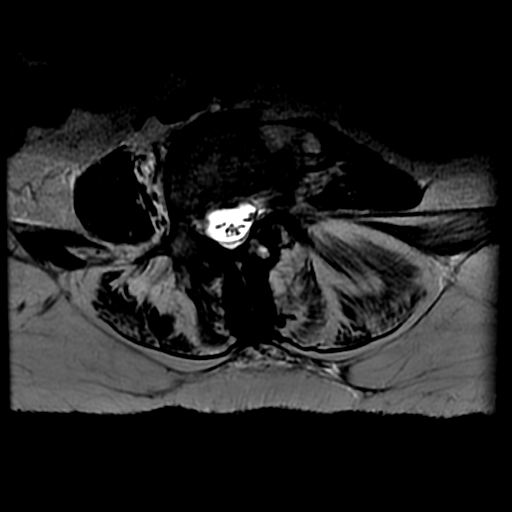
[im 19/38]
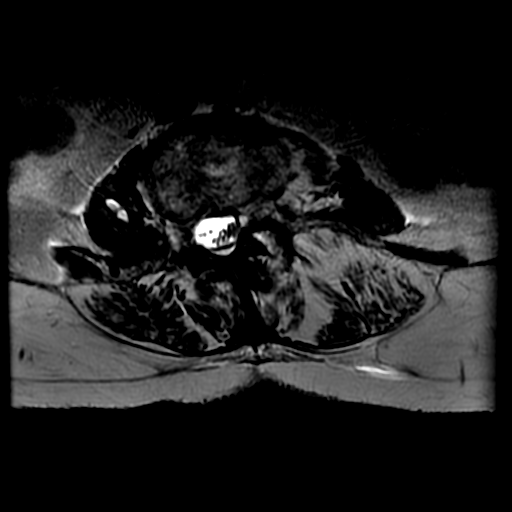
[im 24/38]
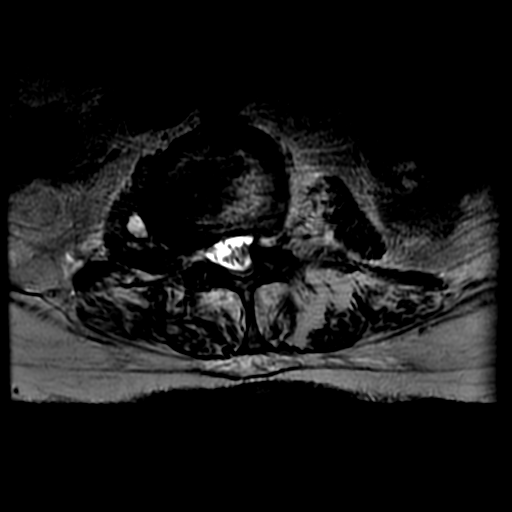
[im 28/38]
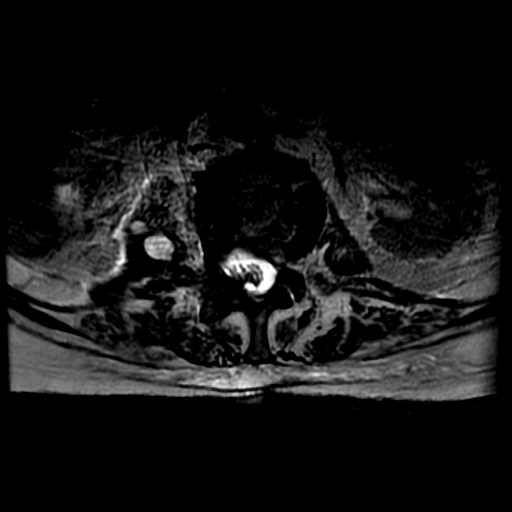
[im 33/38]
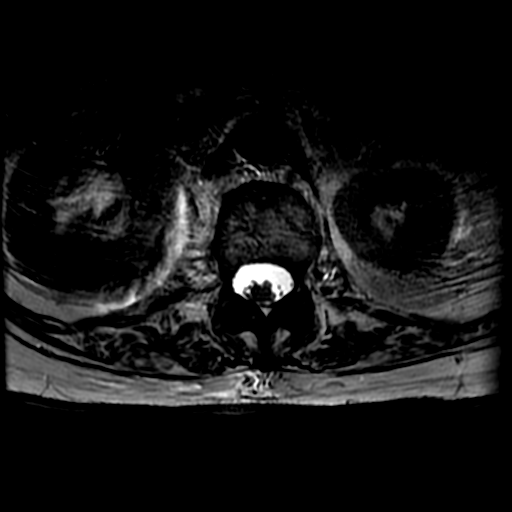

[17 of 48 positions shown; findings below may reference images not displayed]

FINDINGS: MRI THORACIC SPINE FINDINGS

Alignment: Normal alignment. Extension weighted dorsal kyphosis
T7-8.

Vertebrae: Negative for fracture or mass. Scattered hemangiomata.
Discogenic changes in the midthoracic spine

Cord: Spinal cord signal normal throughout the thoracic spine. Cord
flattening at T7-8 due to disc protrusion

Paraspinal and other soft tissues: Small bilateral pleural
effusions. No paraspinous mass.

Disc levels:

T2-3: Broad-based central disc protrusion without significant
stenosis

T4-5: Small right-sided disc protrusion

T6-7: Moderate disc degeneration. Small left-sided disc protrusion
with mild cord flattening on the left

T7-8: Moderately large central disc protrusion with cord flattening
unchanged from the prior study.

T8-9: Small right-sided disc protrusion

T9-10: Disc degeneration with right-sided spurring. Mild right
foraminal stenosis.

T11-12: Small left-sided disc protrusion.

The patient was not able to tolerate postcontrast imaging of the
thoracic or lumbar spine.

MRI LUMBAR SPINE FINDINGS

Segmentation:  Normal

Alignment: Lumbar dextroscoliosis. Mild retrolisthesis L1-2 and L2-3

Vertebrae: Negative for fracture. New endplate edema at L1-2 with
associated fluid signal in the disc space. Findings are suggestive
of discitis osteomyelitis given the history of endocarditis and
interval development since 1 month ago.

Conus medullaris and cauda equina: Conus extends to the L2 level.
Conus and cauda equina appear normal.

Paraspinal and other soft tissues: New fluid collection right psoas
muscle at the L1-2 level measuring 10 x 15 mm. New 6 mm fluid
collection left upper psoas muscle at the L2 level. Probable
abscesses given the other findings.

Disc levels:

T12-L1: Negative

L1-2: Fluid in the disc space with new endplate edema. Fluid in the
disc space projects into the left epidural fluid collection most
compatible with small epidural abscess. This is new. Diffuse
endplate spurring with foraminal narrowing bilaterally due to
spurring.

L2-3: Disc degeneration and spurring. Bilateral facet degeneration.
Severe subarticular stenosis on the left and mild spinal stenosis.
Mild subarticular stenosis on the right

L3-4: Moderate spinal stenosis and moderate subarticular and
foraminal stenosis left greater than right. Disc degeneration and
spurring with moderate facet hypertrophy bilaterally

L4-5: Disc degeneration with diffuse endplate spurring and bilateral
facet hypertrophy. Mild spinal stenosis. Moderate subarticular
bilaterally.

L5-S1: Disc degeneration with endplate spurring. Bilateral facet
hypertrophy. Moderate right foraminal encroachment unchanged.
IMPRESSION: MR THORACIC SPINE IMPRESSION

Multilevel degenerative change throughout the thoracic spine. No
evidence of discitis osteomyelitis or fracture in the thoracic
spine. Small bilateral pleural effusions.

MR LUMBAR SPINE IMPRESSION

Interval development of endplate edema at L1-2. This is most
consistent with discitis and osteomyelitis. Small left-sided
epidural abscess. Bilateral small psoas abscesses have developed
since the prior MRI.

Patient was not able to tolerate postcontrast imaging.

## 2018-12-19 IMAGING — CT CT ANGIO NECK
1 of 11 series · 5 of 35 positions shown · IV contrast (omnipaque)
Comparison: Prior MRI from earlier same day as well as recent CTA
from [DATE].
COMPARISON: Prior MRI from earlier same day as well as recent CTA
from [DATE].

Addendum:
CLINICAL DATA: Initial evaluation for acute stroke, altered mental
status.

EXAM:
CT ANGIOGRAPHY HEAD AND NECK
TECHNIQUE: Multidetector CT imaging of the head and neck was performed using
the standard protocol during bolus administration of intravenous
contrast. Multiplanar CT image reconstructions and MIPs were
obtained to evaluate the vascular anatomy. Carotid stenosis
measurements (when applicable) are obtained utilizing NASCET
criteria, using the distal internal carotid diameter as the
denominator.
CONTRAST:  75mL OMNIPAQUE IOHEXOL 350 MG/ML SOLN

[Series 11: ax thins · axial · 0.45mm/px · z∈[-227,-2]mm · 5 of 339 slices shown]
[im 57/339  soft-tissue]
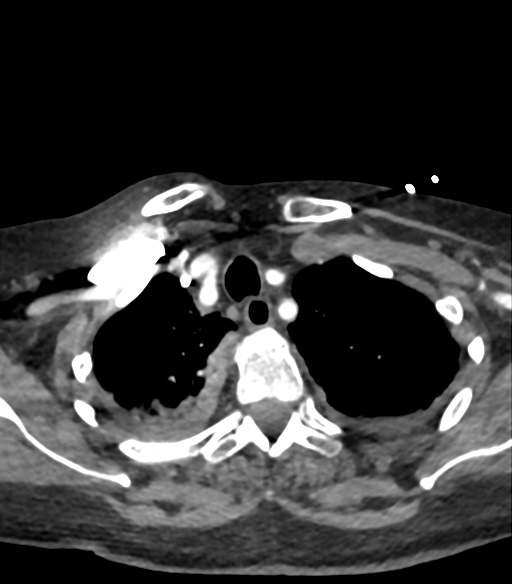
[im 113/339  bone]
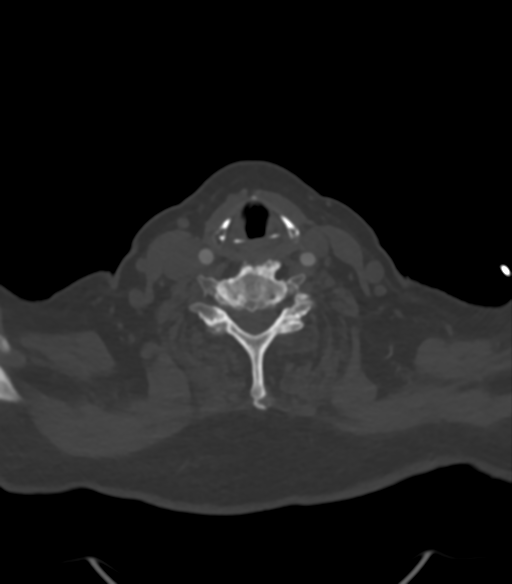
[im 170/339  soft-tissue]
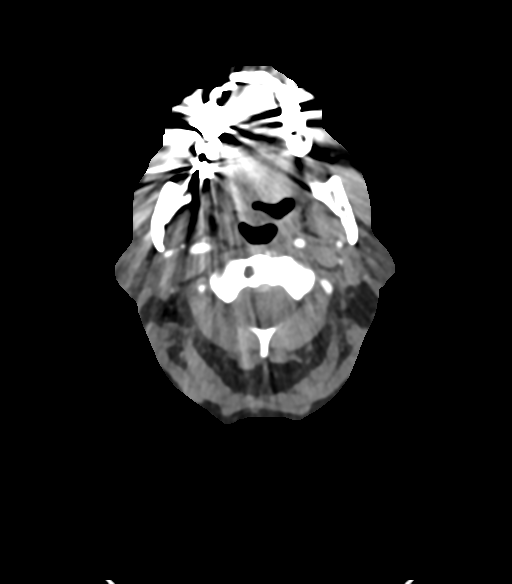
[im 226/339  bone]
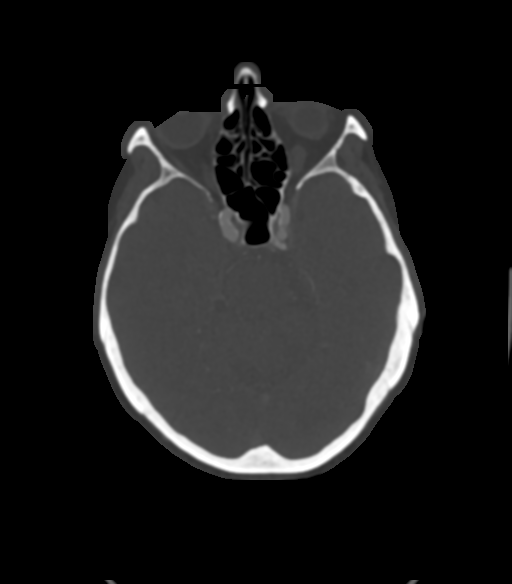
[im 282/339  soft-tissue]
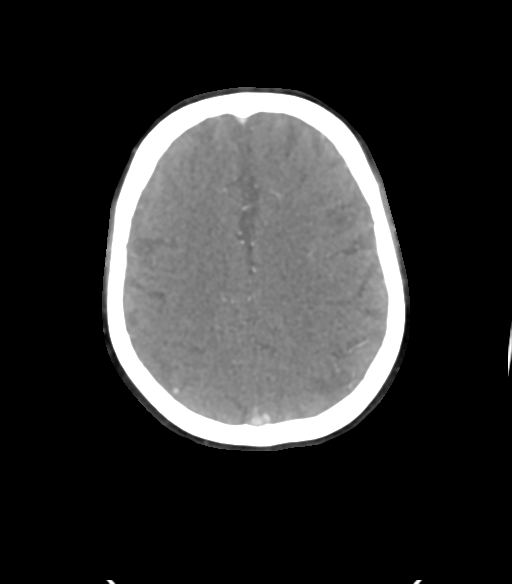

[5 of 35 positions shown; findings below may reference images not displayed]

FINDINGS: CT HEAD FINDINGS

Brain: Age-related cerebral atrophy with chronic small vessel
ischemic disease, stable. Remote left cerebellar infarct noted.
Known small acute infarcts within the left frontal white matter,
bilateral thalami, and right cerebellum are not well seen. No
evidence for hemorrhagic transformation. No acute intracranial
hemorrhage elsewhere. No other acute large vessel territory infarct.
No mass lesion, midline shift or mass effect. No hydrocephalus. No
extra-axial fluid collection.

Vascular: No hyperdense vessel. Calcified atherosclerosis at the
skull base.

Skull: Scalp soft tissues and calvarium within normal limits.
Calvarium intact.

Sinuses: Paranasal sinuses are clear. Chronic right mastoid
effusion, stable.

Orbits: Globes normal soft tissues demonstrate no acute finding.

Review of the MIP images confirms the above findings

CTA NECK FINDINGS

Aortic arch: Visualized aortic arch of normal caliber with normal 3
vessel morphology. Mild-to-moderate atherosclerotic change about the
aortic arch and origin of the great vessels up to approximately 50%
stenosis at the proximal left subclavian artery again noted, stable.
No other hemodynamically significant stenosis about the proximal
great vessels. Visualized subclavian arteries otherwise widely
patent.

Right carotid system: Right CCA tortuous but widely patent to the
bifurcation without stenosis. Mild a centric mixed plaque about the
right bifurcation without hemodynamically significant stenosis.
Right ICA widely patent distally to the skull base without stenosis,
dissection or occlusion.

Left carotid system: Left CCA tortuous but widely patent to the
bifurcation without stenosis. No significant atheromatous narrowing
of the left bifurcation. Left ICA tortuous with mild atheromatous
irregularity but is widely patent without stenosis, dissection or
occlusion.

Vertebral arteries: Both vertebral arteries arise from the
subclavian arteries. Left vertebral artery slightly dominant.
Vertebral arteries patent within the neck without stenosis,
dissection or occlusion.

Skeleton: No acute osseous finding. No discrete lytic or blastic
osseous lesions. Moderate to advanced cervical spondylosis at C3-4
through C6-7. Grade 1 facet mediated anterolisthesis of C7 on T1,
stable.

Other neck: No other acute soft tissue abnormality within the neck.

Upper chest: Small focal filling defects seen within a left upper
lobe segmental pulmonary artery, consistent with acute pulmonary
embolism (series 9, image 160). No other visible pulmonary emboli.
Moderate layering bilateral pleural effusions with associated
atelectasis. Underlying mild pulmonary interstitial edema.

Review of the MIP images confirms the above findings

CTA HEAD FINDINGS

Anterior circulation: Petrous segments widely patent. Calcified
atheromatous plaque within the carotid siphons with no more than
mild multifocal stenosis. ICA termini well perfused. A1 segments
patent bilaterally. Normal anterior communicating artery complex.
Anterior cerebral arteries widely patent to their distal aspects. No
M1 stenosis or occlusion. Negative MCA bifurcations. Distal MCA
branches well perfused and symmetric.

Posterior circulation: Dominant left vertebral artery demonstrates
scattered atheromatous irregularity but is patent to the
vertebrobasilar junction without high-grade stenosis. Patent left
PICA. Hypoplastic right vertebral artery largely terminates in PICA.
Right PICA patent as well. Basilar artery diffusely diminutive but
patent to its distal aspect, stable. Superior cerebral arteries
patent bilaterally. Predominant fetal type origin of the PCAs. Right
PCA patent to its distal aspect without stenosis. Short-segment
severe proximal left P2 stenosis, unchanged.

Venous sinuses: Grossly patent allowing for timing the contrast
bolus.

Anatomic variants: Predominant fetal type origin of the PCAs with
overall diminutive vertebrobasilar system.

Review of the MIP images confirms the above findings
IMPRESSION: 1. Stable CTA of the head and neck as compared to recent exam from
[DATE]. No emergent large vessel occlusion.
2. Short-segment severe proximal left P2 stenosis, stable.
3. Predominant fetal type origin of the PCAs with overall diminutive
vertebrobasilar system.
4. Focal filling defect within a left upper lobe segmental pulmonary
artery, consistent with a small acute PE.
5. Moderate layering bilateral pleural effusions with associated
atelectasis and pulmonary interstitial edema.

Current attempt is being made to contact the covering clinician
regarding these findings. Results will be conveyed as soon as
possible.

ADDENDUM:
Critical Value/emergent results were called by telephone at the time
of interpretation on [DATE] at [DATE] to provider Dr. JUMPER,
Who verbally acknowledged these results.

*** End of Addendum ***
FINDINGS: CT HEAD FINDINGS

Brain: Age-related cerebral atrophy with chronic small vessel
ischemic disease, stable. Remote left cerebellar infarct noted.
Known small acute infarcts within the left frontal white matter,
bilateral thalami, and right cerebellum are not well seen. No
evidence for hemorrhagic transformation. No acute intracranial
hemorrhage elsewhere. No other acute large vessel territory infarct.
No mass lesion, midline shift or mass effect. No hydrocephalus. No
extra-axial fluid collection.

Vascular: No hyperdense vessel. Calcified atherosclerosis at the
skull base.

Skull: Scalp soft tissues and calvarium within normal limits.
Calvarium intact.

Sinuses: Paranasal sinuses are clear. Chronic right mastoid
effusion, stable.

Orbits: Globes normal soft tissues demonstrate no acute finding.

Review of the MIP images confirms the above findings

CTA NECK FINDINGS

Aortic arch: Visualized aortic arch of normal caliber with normal 3
vessel morphology. Mild-to-moderate atherosclerotic change about the
aortic arch and origin of the great vessels up to approximately 50%
stenosis at the proximal left subclavian artery again noted, stable.
No other hemodynamically significant stenosis about the proximal
great vessels. Visualized subclavian arteries otherwise widely
patent.

Right carotid system: Right CCA tortuous but widely patent to the
bifurcation without stenosis. Mild a centric mixed plaque about the
right bifurcation without hemodynamically significant stenosis.
Right ICA widely patent distally to the skull base without stenosis,
dissection or occlusion.

Left carotid system: Left CCA tortuous but widely patent to the
bifurcation without stenosis. No significant atheromatous narrowing
of the left bifurcation. Left ICA tortuous with mild atheromatous
irregularity but is widely patent without stenosis, dissection or
occlusion.

Vertebral arteries: Both vertebral arteries arise from the
subclavian arteries. Left vertebral artery slightly dominant.
Vertebral arteries patent within the neck without stenosis,
dissection or occlusion.

Skeleton: No acute osseous finding. No discrete lytic or blastic
osseous lesions. Moderate to advanced cervical spondylosis at C3-4
through C6-7. Grade 1 facet mediated anterolisthesis of C7 on T1,
stable.

Other neck: No other acute soft tissue abnormality within the neck.

Upper chest: Small focal filling defects seen within a left upper
lobe segmental pulmonary artery, consistent with acute pulmonary
embolism (series 9, image 160). No other visible pulmonary emboli.
Moderate layering bilateral pleural effusions with associated
atelectasis. Underlying mild pulmonary interstitial edema.

Review of the MIP images confirms the above findings

CTA HEAD FINDINGS

Anterior circulation: Petrous segments widely patent. Calcified
atheromatous plaque within the carotid siphons with no more than
mild multifocal stenosis. ICA termini well perfused. A1 segments
patent bilaterally. Normal anterior communicating artery complex.
Anterior cerebral arteries widely patent to their distal aspects. No
M1 stenosis or occlusion. Negative MCA bifurcations. Distal MCA
branches well perfused and symmetric.

Posterior circulation: Dominant left vertebral artery demonstrates
scattered atheromatous irregularity but is patent to the
vertebrobasilar junction without high-grade stenosis. Patent left
PICA. Hypoplastic right vertebral artery largely terminates in PICA.
Right PICA patent as well. Basilar artery diffusely diminutive but
patent to its distal aspect, stable. Superior cerebral arteries
patent bilaterally. Predominant fetal type origin of the PCAs. Right
PCA patent to its distal aspect without stenosis. Short-segment
severe proximal left P2 stenosis, unchanged.

Venous sinuses: Grossly patent allowing for timing the contrast
bolus.

Anatomic variants: Predominant fetal type origin of the PCAs with
overall diminutive vertebrobasilar system.

Review of the MIP images confirms the above findings
IMPRESSION: 1. Stable CTA of the head and neck as compared to recent exam from
[DATE]. No emergent large vessel occlusion.
2. Short-segment severe proximal left P2 stenosis, stable.
3. Predominant fetal type origin of the PCAs with overall diminutive
vertebrobasilar system.
4. Focal filling defect within a left upper lobe segmental pulmonary
artery, consistent with a small acute PE.
5. Moderate layering bilateral pleural effusions with associated
atelectasis and pulmonary interstitial edema.

Current attempt is being made to contact the covering clinician
regarding these findings. Results will be conveyed as soon as
possible.

## 2018-12-19 IMAGING — CT CT ANGIO HEAD
1 of 11 series · 5 of 35 positions shown · IV contrast (APPLIED)
Comparison: Prior MRI from earlier same day as well as recent CTA
from [DATE].
COMPARISON: Prior MRI from earlier same day as well as recent CTA
from [DATE].

Addendum:
CLINICAL DATA: Initial evaluation for acute stroke, altered mental
status.

EXAM:
CT ANGIOGRAPHY HEAD AND NECK
TECHNIQUE: Multidetector CT imaging of the head and neck was performed using
the standard protocol during bolus administration of intravenous
contrast. Multiplanar CT image reconstructions and MIPs were
obtained to evaluate the vascular anatomy. Carotid stenosis
measurements (when applicable) are obtained utilizing NASCET
criteria, using the distal internal carotid diameter as the
denominator.
CONTRAST:  75mL OMNIPAQUE IOHEXOL 350 MG/ML SOLN

[Series 11: ax thins · axial · 0.45mm/px · z∈[-227,-2]mm · 5 of 339 slices shown]
[im 57/339  soft-tissue]
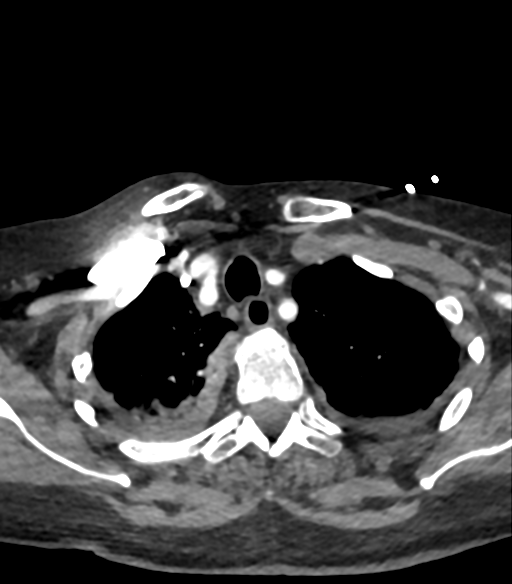
[im 113/339  bone]
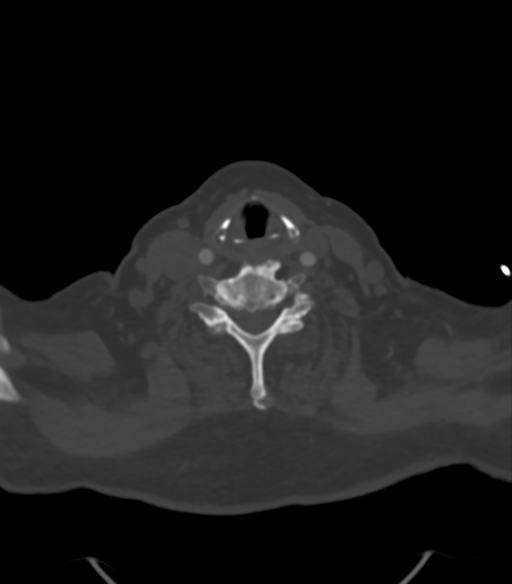
[im 170/339  soft-tissue]
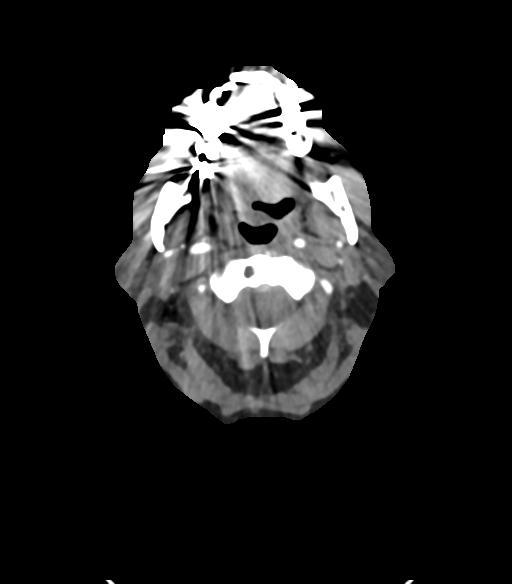
[im 226/339  bone]
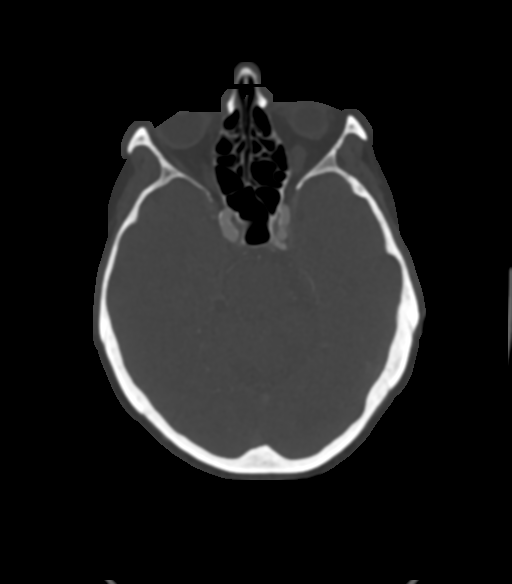
[im 282/339  soft-tissue]
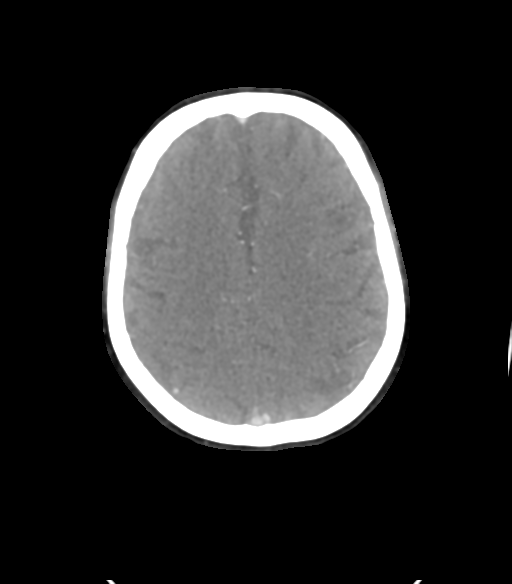

[5 of 35 positions shown; findings below may reference images not displayed]

FINDINGS: CT HEAD FINDINGS

Brain: Age-related cerebral atrophy with chronic small vessel
ischemic disease, stable. Remote left cerebellar infarct noted.
Known small acute infarcts within the left frontal white matter,
bilateral thalami, and right cerebellum are not well seen. No
evidence for hemorrhagic transformation. No acute intracranial
hemorrhage elsewhere. No other acute large vessel territory infarct.
No mass lesion, midline shift or mass effect. No hydrocephalus. No
extra-axial fluid collection.

Vascular: No hyperdense vessel. Calcified atherosclerosis at the
skull base.

Skull: Scalp soft tissues and calvarium within normal limits.
Calvarium intact.

Sinuses: Paranasal sinuses are clear. Chronic right mastoid
effusion, stable.

Orbits: Globes normal soft tissues demonstrate no acute finding.

Review of the MIP images confirms the above findings

CTA NECK FINDINGS

Aortic arch: Visualized aortic arch of normal caliber with normal 3
vessel morphology. Mild-to-moderate atherosclerotic change about the
aortic arch and origin of the great vessels up to approximately 50%
stenosis at the proximal left subclavian artery again noted, stable.
No other hemodynamically significant stenosis about the proximal
great vessels. Visualized subclavian arteries otherwise widely
patent.

Right carotid system: Right CCA tortuous but widely patent to the
bifurcation without stenosis. Mild a centric mixed plaque about the
right bifurcation without hemodynamically significant stenosis.
Right ICA widely patent distally to the skull base without stenosis,
dissection or occlusion.

Left carotid system: Left CCA tortuous but widely patent to the
bifurcation without stenosis. No significant atheromatous narrowing
of the left bifurcation. Left ICA tortuous with mild atheromatous
irregularity but is widely patent without stenosis, dissection or
occlusion.

Vertebral arteries: Both vertebral arteries arise from the
subclavian arteries. Left vertebral artery slightly dominant.
Vertebral arteries patent within the neck without stenosis,
dissection or occlusion.

Skeleton: No acute osseous finding. No discrete lytic or blastic
osseous lesions. Moderate to advanced cervical spondylosis at C3-4
through C6-7. Grade 1 facet mediated anterolisthesis of C7 on T1,
stable.

Other neck: No other acute soft tissue abnormality within the neck.

Upper chest: Small focal filling defects seen within a left upper
lobe segmental pulmonary artery, consistent with acute pulmonary
embolism (series 9, image 160). No other visible pulmonary emboli.
Moderate layering bilateral pleural effusions with associated
atelectasis. Underlying mild pulmonary interstitial edema.

Review of the MIP images confirms the above findings

CTA HEAD FINDINGS

Anterior circulation: Petrous segments widely patent. Calcified
atheromatous plaque within the carotid siphons with no more than
mild multifocal stenosis. ICA termini well perfused. A1 segments
patent bilaterally. Normal anterior communicating artery complex.
Anterior cerebral arteries widely patent to their distal aspects. No
M1 stenosis or occlusion. Negative MCA bifurcations. Distal MCA
branches well perfused and symmetric.

Posterior circulation: Dominant left vertebral artery demonstrates
scattered atheromatous irregularity but is patent to the
vertebrobasilar junction without high-grade stenosis. Patent left
PICA. Hypoplastic right vertebral artery largely terminates in PICA.
Right PICA patent as well. Basilar artery diffusely diminutive but
patent to its distal aspect, stable. Superior cerebral arteries
patent bilaterally. Predominant fetal type origin of the PCAs. Right
PCA patent to its distal aspect without stenosis. Short-segment
severe proximal left P2 stenosis, unchanged.

Venous sinuses: Grossly patent allowing for timing the contrast
bolus.

Anatomic variants: Predominant fetal type origin of the PCAs with
overall diminutive vertebrobasilar system.

Review of the MIP images confirms the above findings
IMPRESSION: 1. Stable CTA of the head and neck as compared to recent exam from
[DATE]. No emergent large vessel occlusion.
2. Short-segment severe proximal left P2 stenosis, stable.
3. Predominant fetal type origin of the PCAs with overall diminutive
vertebrobasilar system.
4. Focal filling defect within a left upper lobe segmental pulmonary
artery, consistent with a small acute PE.
5. Moderate layering bilateral pleural effusions with associated
atelectasis and pulmonary interstitial edema.

Current attempt is being made to contact the covering clinician
regarding these findings. Results will be conveyed as soon as
possible.

ADDENDUM:
Critical Value/emergent results were called by telephone at the time
of interpretation on [DATE] at [DATE] to provider Dr. JUMPER,
Who verbally acknowledged these results.

*** End of Addendum ***
FINDINGS: CT HEAD FINDINGS

Brain: Age-related cerebral atrophy with chronic small vessel
ischemic disease, stable. Remote left cerebellar infarct noted.
Known small acute infarcts within the left frontal white matter,
bilateral thalami, and right cerebellum are not well seen. No
evidence for hemorrhagic transformation. No acute intracranial
hemorrhage elsewhere. No other acute large vessel territory infarct.
No mass lesion, midline shift or mass effect. No hydrocephalus. No
extra-axial fluid collection.

Vascular: No hyperdense vessel. Calcified atherosclerosis at the
skull base.

Skull: Scalp soft tissues and calvarium within normal limits.
Calvarium intact.

Sinuses: Paranasal sinuses are clear. Chronic right mastoid
effusion, stable.

Orbits: Globes normal soft tissues demonstrate no acute finding.

Review of the MIP images confirms the above findings

CTA NECK FINDINGS

Aortic arch: Visualized aortic arch of normal caliber with normal 3
vessel morphology. Mild-to-moderate atherosclerotic change about the
aortic arch and origin of the great vessels up to approximately 50%
stenosis at the proximal left subclavian artery again noted, stable.
No other hemodynamically significant stenosis about the proximal
great vessels. Visualized subclavian arteries otherwise widely
patent.

Right carotid system: Right CCA tortuous but widely patent to the
bifurcation without stenosis. Mild a centric mixed plaque about the
right bifurcation without hemodynamically significant stenosis.
Right ICA widely patent distally to the skull base without stenosis,
dissection or occlusion.

Left carotid system: Left CCA tortuous but widely patent to the
bifurcation without stenosis. No significant atheromatous narrowing
of the left bifurcation. Left ICA tortuous with mild atheromatous
irregularity but is widely patent without stenosis, dissection or
occlusion.

Vertebral arteries: Both vertebral arteries arise from the
subclavian arteries. Left vertebral artery slightly dominant.
Vertebral arteries patent within the neck without stenosis,
dissection or occlusion.

Skeleton: No acute osseous finding. No discrete lytic or blastic
osseous lesions. Moderate to advanced cervical spondylosis at C3-4
through C6-7. Grade 1 facet mediated anterolisthesis of C7 on T1,
stable.

Other neck: No other acute soft tissue abnormality within the neck.

Upper chest: Small focal filling defects seen within a left upper
lobe segmental pulmonary artery, consistent with acute pulmonary
embolism (series 9, image 160). No other visible pulmonary emboli.
Moderate layering bilateral pleural effusions with associated
atelectasis. Underlying mild pulmonary interstitial edema.

Review of the MIP images confirms the above findings

CTA HEAD FINDINGS

Anterior circulation: Petrous segments widely patent. Calcified
atheromatous plaque within the carotid siphons with no more than
mild multifocal stenosis. ICA termini well perfused. A1 segments
patent bilaterally. Normal anterior communicating artery complex.
Anterior cerebral arteries widely patent to their distal aspects. No
M1 stenosis or occlusion. Negative MCA bifurcations. Distal MCA
branches well perfused and symmetric.

Posterior circulation: Dominant left vertebral artery demonstrates
scattered atheromatous irregularity but is patent to the
vertebrobasilar junction without high-grade stenosis. Patent left
PICA. Hypoplastic right vertebral artery largely terminates in PICA.
Right PICA patent as well. Basilar artery diffusely diminutive but
patent to its distal aspect, stable. Superior cerebral arteries
patent bilaterally. Predominant fetal type origin of the PCAs. Right
PCA patent to its distal aspect without stenosis. Short-segment
severe proximal left P2 stenosis, unchanged.

Venous sinuses: Grossly patent allowing for timing the contrast
bolus.

Anatomic variants: Predominant fetal type origin of the PCAs with
overall diminutive vertebrobasilar system.

Review of the MIP images confirms the above findings
IMPRESSION: 1. Stable CTA of the head and neck as compared to recent exam from
[DATE]. No emergent large vessel occlusion.
2. Short-segment severe proximal left P2 stenosis, stable.
3. Predominant fetal type origin of the PCAs with overall diminutive
vertebrobasilar system.
4. Focal filling defect within a left upper lobe segmental pulmonary
artery, consistent with a small acute PE.
5. Moderate layering bilateral pleural effusions with associated
atelectasis and pulmonary interstitial edema.

Current attempt is being made to contact the covering clinician
regarding these findings. Results will be conveyed as soon as
possible.

## 2018-12-19 IMAGING — MR MR LUMBAR SPINE W/O CM
15 of 26 series · 19 of 48 positions shown · non-contrast
Comparison: MRI thoracic and lumbar spine [DATE]

CLINICAL DATA: Back pain.  Endocarditis.

EXAM:
MRI THORACIC AND LUMBAR SPINE WITHOUT CONTRAST
TECHNIQUE: Multiplanar and multiecho pulse sequences of the thoracic and lumbar
spine were obtained without intravenous contrast.

[Series 5: T2 · axial · 5.0mm · 0.43mm/px · 1 of 27 slices shown (1 of 7)]
[im 1/27]
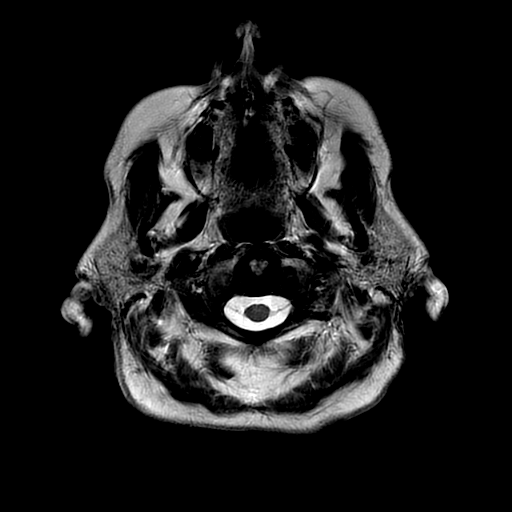

[Series 8: T1 · axial · non-contrast · 3.0mm · 0.86mm/px · z∈[-93,+53]mm · 3 of 50 slices shown (1 of 7)]
[im 1/50]
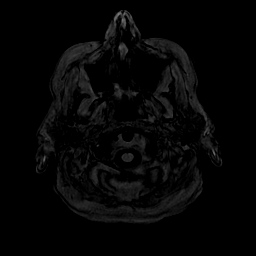
[im 25/50]
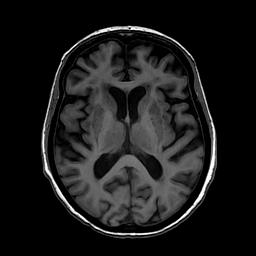
[im 50/50]
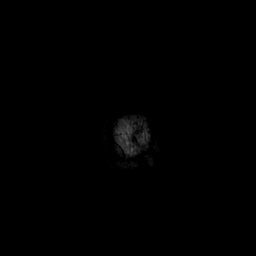

[Series 9: T2 · coronal · 5.0mm · 0.39mm/px · 1 of 28 slices shown (2 of 7)]
[im 1/28]
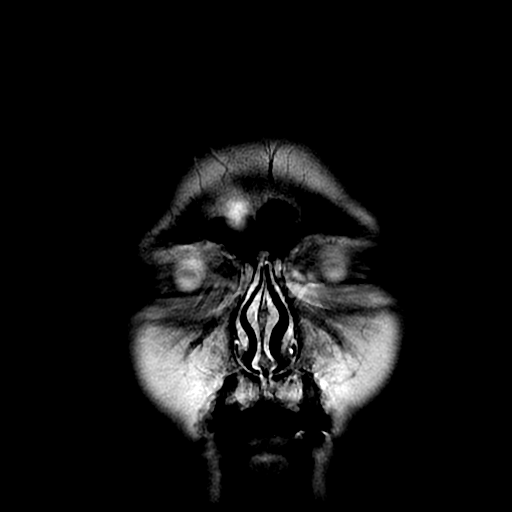

[Series 11: T1 · sagittal · 3.0mm · 0.90mm/px · 1 of 12 slices shown (2 of 7)]
[im 1/12]
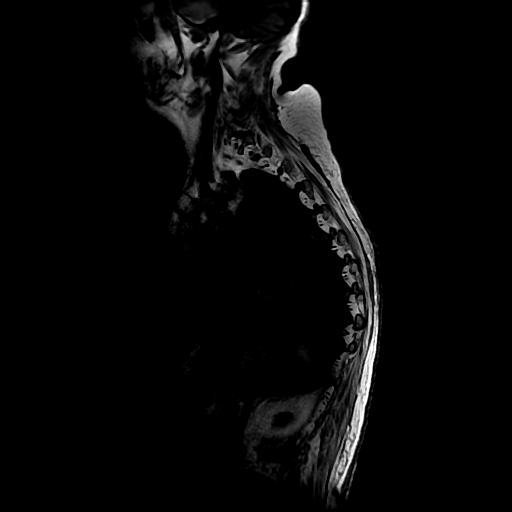

[Series 12: T2 · sagittal · 3.0mm · 0.59mm/px · 1 of 15 slices shown (3 of 7)]
[im 1/15]
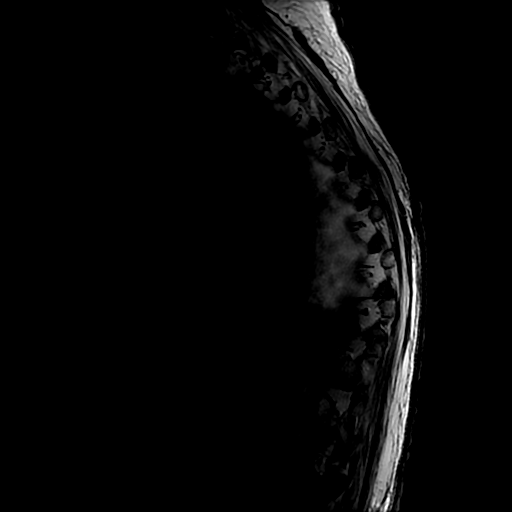

[Series 13: STIR · sagittal · 3.0mm · 0.59mm/px · 1 of 15 slices shown]
[im 1/15]
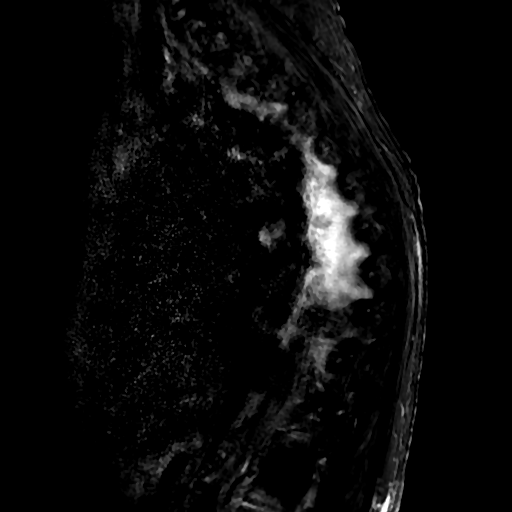

[Series 14: T1 · sagittal · 3.0mm · 0.59mm/px · 1 of 15 slices shown (3 of 7)]
[im 1/15]
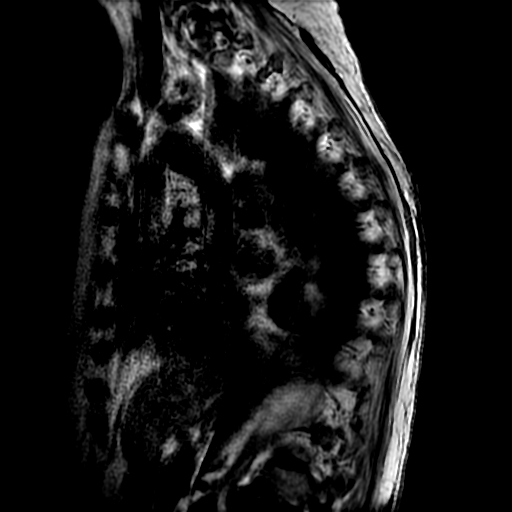

[Series 15: T2 · axial · 4.0mm · 0.39mm/px · 1 of 27 slices shown (4 of 7)]
[im 1/27]
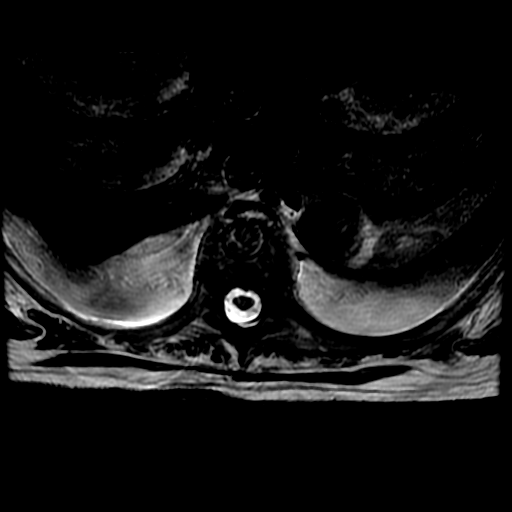

[Series 16: T2 · axial · 4.0mm · 0.39mm/px · 1 of 29 slices shown (5 of 7)]
[im 1/29]
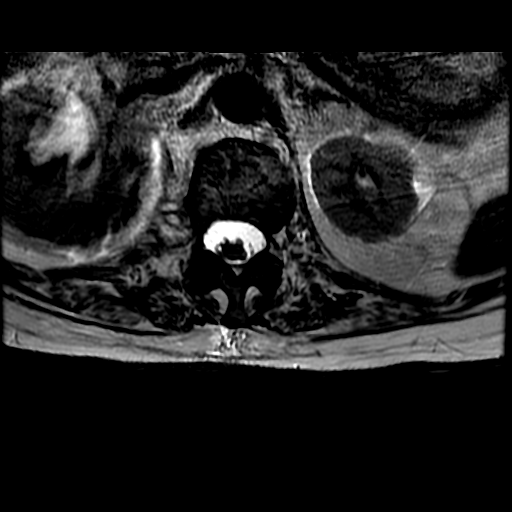

[Series 19: T1 · axial · non-contrast · 4.0mm · 0.39mm/px · 1 of 27 slices shown (4 of 7)]
[im 1/27]
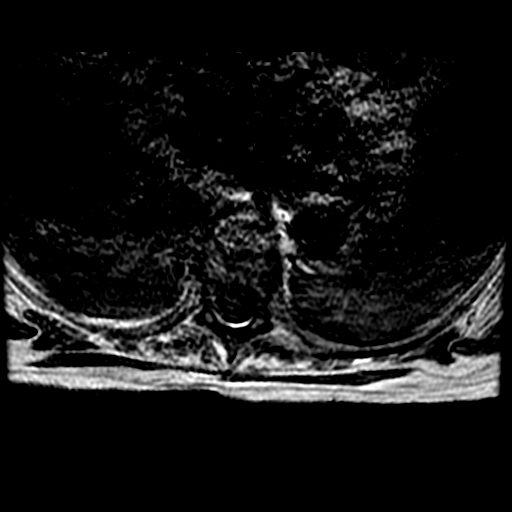

[Series 20: T1 · axial · non-contrast · 4.0mm · 0.39mm/px · 1 of 29 slices shown (5 of 7)]
[im 1/29]
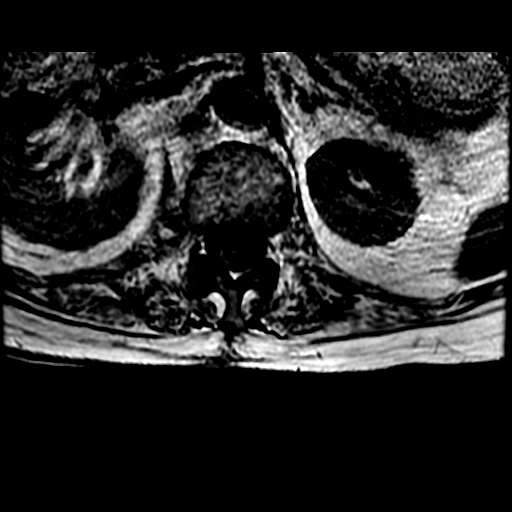

[Series 22: T2 · sagittal · 4.0mm · 0.55mm/px · 1 of 14 slices shown (6 of 7)]
[im 1/14]
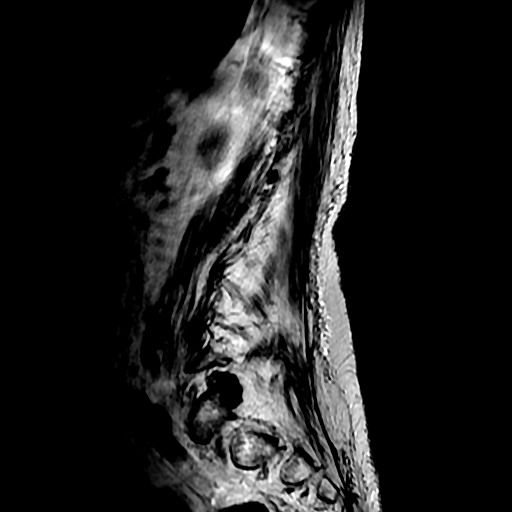

[Series 24: T1 · sagittal · 4.0mm · 0.55mm/px · 1 of 14 slices shown (6 of 7)]
[im 1/14]
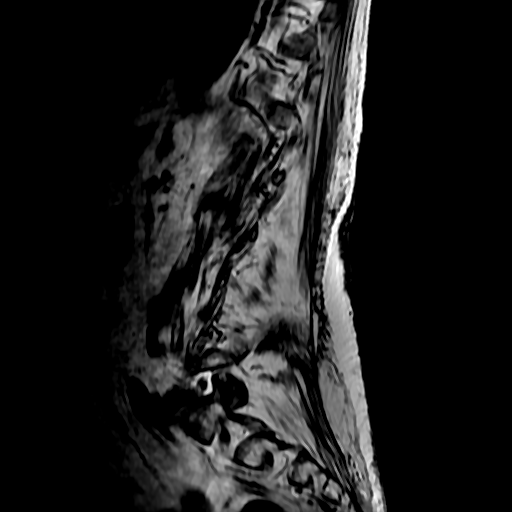

[Series 25: T2 · axial · 4.0mm · 0.39mm/px · z∈[-601,-416]mm · 2 of 38 slices shown (7 of 7)]
[im 1/38]
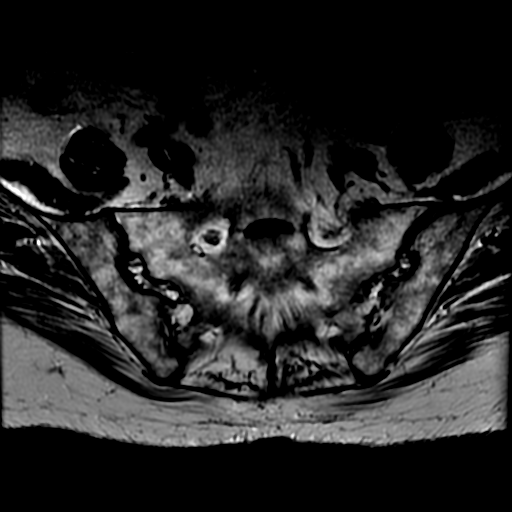
[im 38/38]
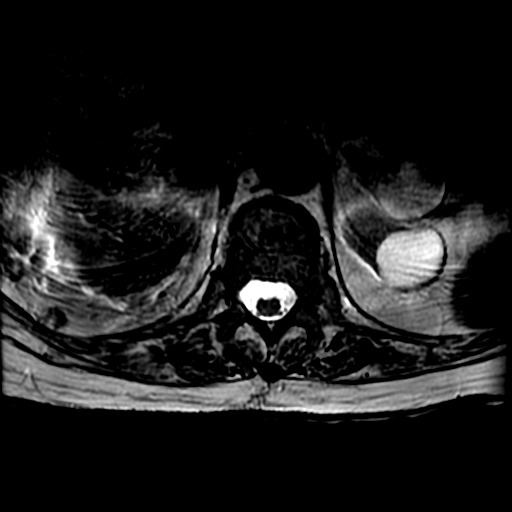

[Series 26: T1 · axial · 4.0mm · 0.39mm/px · z∈[-601,-416]mm · 2 of 38 slices shown (7 of 7)]
[im 1/38]
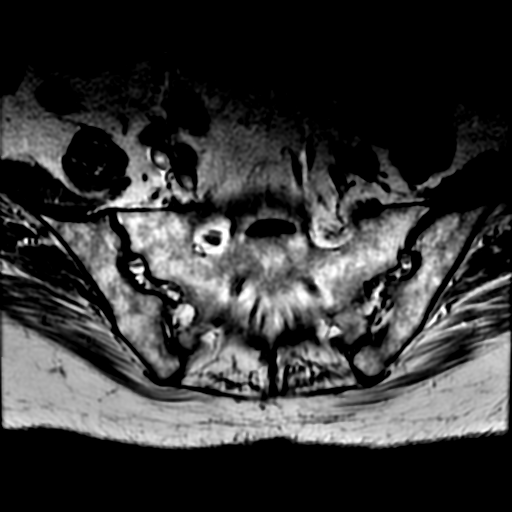
[im 38/38]
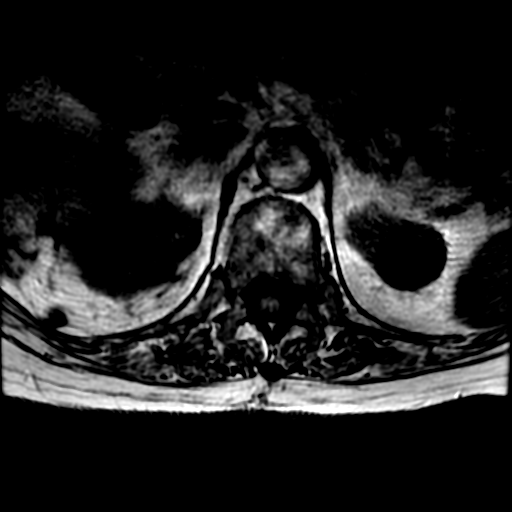

[19 of 48 positions shown; findings below may reference images not displayed]

FINDINGS: MRI THORACIC SPINE FINDINGS

Alignment: Normal alignment. Extension weighted dorsal kyphosis
T7-8.

Vertebrae: Negative for fracture or mass. Scattered hemangiomata.
Discogenic changes in the midthoracic spine

Cord: Spinal cord signal normal throughout the thoracic spine. Cord
flattening at T7-8 due to disc protrusion

Paraspinal and other soft tissues: Small bilateral pleural
effusions. No paraspinous mass.

Disc levels:

T2-3: Broad-based central disc protrusion without significant
stenosis

T4-5: Small right-sided disc protrusion

T6-7: Moderate disc degeneration. Small left-sided disc protrusion
with mild cord flattening on the left

T7-8: Moderately large central disc protrusion with cord flattening
unchanged from the prior study.

T8-9: Small right-sided disc protrusion

T9-10: Disc degeneration with right-sided spurring. Mild right
foraminal stenosis.

T11-12: Small left-sided disc protrusion.

The patient was not able to tolerate postcontrast imaging of the
thoracic or lumbar spine.

MRI LUMBAR SPINE FINDINGS

Segmentation:  Normal

Alignment: Lumbar dextroscoliosis. Mild retrolisthesis L1-2 and L2-3

Vertebrae: Negative for fracture. New endplate edema at L1-2 with
associated fluid signal in the disc space. Findings are suggestive
of discitis osteomyelitis given the history of endocarditis and
interval development since 1 month ago.

Conus medullaris and cauda equina: Conus extends to the L2 level.
Conus and cauda equina appear normal.

Paraspinal and other soft tissues: New fluid collection right psoas
muscle at the L1-2 level measuring 10 x 15 mm. New 6 mm fluid
collection left upper psoas muscle at the L2 level. Probable
abscesses given the other findings.

Disc levels:

T12-L1: Negative

L1-2: Fluid in the disc space with new endplate edema. Fluid in the
disc space projects into the left epidural fluid collection most
compatible with small epidural abscess. This is new. Diffuse
endplate spurring with foraminal narrowing bilaterally due to
spurring.

L2-3: Disc degeneration and spurring. Bilateral facet degeneration.
Severe subarticular stenosis on the left and mild spinal stenosis.
Mild subarticular stenosis on the right

L3-4: Moderate spinal stenosis and moderate subarticular and
foraminal stenosis left greater than right. Disc degeneration and
spurring with moderate facet hypertrophy bilaterally

L4-5: Disc degeneration with diffuse endplate spurring and bilateral
facet hypertrophy. Mild spinal stenosis. Moderate subarticular
bilaterally.

L5-S1: Disc degeneration with endplate spurring. Bilateral facet
hypertrophy. Moderate right foraminal encroachment unchanged.
IMPRESSION: MR THORACIC SPINE IMPRESSION

Multilevel degenerative change throughout the thoracic spine. No
evidence of discitis osteomyelitis or fracture in the thoracic
spine. Small bilateral pleural effusions.

MR LUMBAR SPINE IMPRESSION

Interval development of endplate edema at L1-2. This is most
consistent with discitis and osteomyelitis. Small left-sided
epidural abscess. Bilateral small psoas abscesses have developed
since the prior MRI.

Patient was not able to tolerate postcontrast imaging.

## 2018-12-19 MED ORDER — TRAMADOL HCL 50 MG PO TABS
50.0000 mg | ORAL_TABLET | Freq: Four times a day (QID) | ORAL | Status: DC | PRN
  Administered 2018-12-19 – 2018-12-29 (×11): 50 mg via ORAL
  Filled 2018-12-19 (×12): qty 1

## 2018-12-19 MED ORDER — IOHEXOL 350 MG/ML SOLN
75.0000 mL | Freq: Once | INTRAVENOUS | Status: AC | PRN
  Administered 2018-12-19: 75 mL via INTRAVENOUS

## 2018-12-19 NOTE — Progress Notes (Signed)
[place] for heparin Indication: pulmonary embolus  Allergies  Allergen Reactions  . Chlorhexidine   . Hydrochlorothiazide Other (See Comments)    REACTION: hyponatremia  . Ramipril Other (See Comments)    REACTION: GI upset  . Remeron [Mirtazapine] Other (See Comments)    REACTION: weakness  . Sulfa Antibiotics Rash    Patient Measurements: Height: 5\' 3"  (160 cm) Weight: 166 lb 3.6 oz (75.4 kg)(per last visit) IBW/kg (Calculated) : 52.4 Heparin Dosing Weight: 68.5 kg  Vital Signs: Temp: 97.9 F (36.6 C) (11/04 2020) Temp Source: Oral (11/04 2020) BP: 122/52 (11/04 2020) Pulse Rate: 82 (11/04 2020)  Labs: Recent Labs    12/18/18 1056 12/18/18 1215 12/18/18 1251 12/18/18 1627 12/19/18 0121  HGB 7.9*  --  7.5*  --   --   HCT 24.9*  --  22.0*  --   --   PLT 261  --   --   --   --   APTT 41*  --   --   --   --   LABPROT 15.1  --   --   --   --   INR 1.2  --   --   --   --   HEPARINUNFRC  --   --   --   --  0.96*  CREATININE 0.85  --   --   --   --   TROPONINIHS  --  8  --  7  --     Estimated Creatinine Clearance: 47.9 mL/min (by C-G formula based on SCr of 0.85 mg/dL).  Assessment: 85 y.o. female with PE for heparin  Goal of Therapy:  Heparin level 0.3-0.7 units/ml Monitor platelets by anticoagulation protocol: Yes   Plan:  Decrease Heparin 950 units/hr Check heparin level in 8 hours.    Phillis Knack, PharmD, BCPS  12/19/2018,2:57 AM

## 2018-12-19 NOTE — Consult Note (Addendum)
Neurology Consultation  Reason for Consult: Stroke/possibly secondary to bacterial endocarditis Referring Physician: Hanley Ben  CC: Shortness of breath/fever/mental status  History is obtained from: Chart  HPI: Autumn [name] is a 85 y.o. female with history of hypertension, chronic systolic congestive heart failure, hypothyroidism, left bundle branch block, peripheral neuropathy, who was admitted from 11/12/2018-11/26/2018.  At that time she was treated for acute ischemic left cerebral infarct in the setting of MSSA bacteremia in conjunction with posterior mitral valve vegetation consistent with endocarditis by TEE on 11/20/2018.  Was also noted at that time she had a EF of 25%.  Patient was initially treated with nafcillin however antibiotics were changed to ceftriaxone secondary to kidney function.  Patient was treated with ceftriaxone with the plan to complete IV antibiotic on 12/31/2018 for the endocarditis.   Patient returned to the hospital on 12/18/2018 22nd to fever and altered mental status. ED a stat chest x-ray and Doppler ultrasound of both arms were normal according to note.  Patient had a temperature of 102.4 and O2 sats in the 80s.  Patient was started on vancomycin and cefepime.  Due to pulmonary embolism found on CT patient was started on heparin.  With today's note stating probably switch to Eliquis tomorrow.  Lower extremity Dopplers were obtained which did not show any evidence of deep vein thrombosis.  Echocardiogram at this point time is currently underway.   ED course CT chest/chest x-ray/blood cultures/labs   Chart review-patient was last seen on 10/6 for cerebellar infarcts which were secondary to endocarditis.  Stroke work-up at that time: CT head-left superior cerebellar infarct extending into the left cerebellar peduncle CTA of head and neck showed no LVO. MRI showed large left cerebellar infarct 2D echo on 9/30 showed EF of 25 Repeat 2D echo on 10/6 showed EF of 55-60%  with no vegetations seen TEE showed EF of 55-60% with very small mobile echodensity attached to the posterior mitral valve leaflet measuring 3.5 x 2 mm Left upper extremity Doppler showed a superficial left cephalic thrombus LDL was 89 HbA1c was 5.9 At that time patient was continued on aspirin given negative for mycotic aneurysm via CTA and history of cardiomyopathy.    LKW: Unknown tpa given?: no, window Premorbid modified Rankin scale (mRS): 2 NIHSS-2   Past Medical History:  Diagnosis Date  . Anxiety   . GERD (gastroesophageal reflux disease)   . Hypertension   . Hypothyroid   . LBBB (left bundle branch block)   . MRSA bacteremia 11/19/2018  . Osteopenia   . Peripheral neuropathy   . Venous insufficiency     Family History  Problem Relation Age of Onset  . Hypertension Mother   . Prostate cancer Father    Social History:   reports that she has never smoked. She has never used smokeless tobacco. She reports current alcohol use. She reports that she does not use drugs.  Medications  Current Facility-Administered Medications:  .  acetaminophen (TYLENOL) tablet 650 mg, 650 mg, Oral, Q6H PRN, 650 mg at 12/19/18 1020 **OR** acetaminophen (TYLENOL) suppository 650 mg, 650 mg, Rectal, Q6H PRN, Smith, Rondell A, MD .  albuterol (PROVENTIL) (2.5 MG/3ML) 0.083% nebulizer solution 2.5 mg, 2.5 mg, Nebulization, Q6H PRN, Smith, Rondell A, MD .  ceFEPIme (MAXIPIME) 2 g in sodium chloride 0.9 % 100 mL IVPB, 2 g, Intravenous, Q12H, Rumbarger, Faye Ramsay, RPH, Stopped at 12/19/18 0300 .  diclofenac sodium (VOLTAREN) 1 % transdermal gel 4 g, 4 g, Topical, QID PRN, Katrinka Blazing, Rondell  A, MD .  heparin ADULT infusion 100 units/mL (25000 units/27050mL sodium chloride 0.45%), 950 Units/hr, Intravenous, Continuous, Smith, Rondell A, MD, Last Rate: 9.5 mL/hr at 12/19/18 1015, 950 Units/hr at 12/19/18 1015 .  ondansetron (ZOFRAN) tablet 4 mg, 4 mg, Oral, Q6H PRN **OR** ondansetron (ZOFRAN) injection 4  mg, 4 mg, Intravenous, Q6H PRN, Smith, Rondell A, MD .  sodium chloride flush (NS) 0.9 % injection 10-40 mL, 10-40 mL, Intracatheter, PRN, Raeford RazorKohut, Stephen, MD .  sodium chloride flush (NS) 0.9 % injection 3 mL, 3 mL, Intravenous, Q12H, Smith, Rondell A, MD .  vancomycin (VANCOCIN) IVPB 1000 mg/200 mL premix, 1,000 mg, Intravenous, Q24H, Rumbarger, Faye Ramsayachel L, RPH  Exam: Current vital signs: BP (!) 138/58 (BP Location: Left Arm)   Pulse (!) 109   Temp 99.1 F (37.3 C) (Axillary)   Resp 16   Ht 5\' 3"  (1.6 m)   Wt 75.4 kg Comment: per last visit  SpO2 96%   BMI 29.45 kg/m  Vital signs in last 24 hours: Temp:  [97.9 F (36.6 C)-99.1 F (37.3 C)] 99.1 F (37.3 C) (11/05 1003) Pulse Rate:  [71-109] 109 (11/05 1003) Resp:  [13-27] 16 (11/05 1003) BP: (94-138)/(42-90) 138/58 (11/05 1003) SpO2:  [93 %-100 %] 96 % (11/05 1003)  ROS:    General ROS: negative for - chills, fatigue, fever, night sweats, weight gain or weight loss Psychological ROS: negative for - behavioral disorder, hallucinations, memory difficulties, mood swings or suicidal ideation Ophthalmic ROS: negative for - blurry vision, double vision, eye pain or loss of vision ENT ROS: negative for - epistaxis, nasal discharge, oral lesions, sore throat, tinnitus or vertigo Respiratory ROS: negative for - cough, hemoptysis, shortness of breath or wheezing Cardiovascular ROS: negative for - chest pain, dyspnea on exertion, edema or irregular heartbeat Gastrointestinal ROS: negative for - abdominal pain, diarrhea, hematemesis, nausea/vomiting or stool incontinence Genito-Urinary ROS: negative for - dysuria, hematuria, incontinence or urinary frequency/urgency Musculoskeletal ROS: Positive for -  muscular weakness Neurological ROS: as noted in HPI Dermatological ROS: negative for rash and skin lesion changes   Physical Exam   Constitutional: Appears well-developed and well-nourished.  Psych: Affect appropriate to situation Eyes:  No scleral injection HENT: No OP obstrucion Head: Normocephalic.  Cardiovascular: Normal rate and regular rhythm.  Respiratory: Effort normal, non-labored breathing GI: Soft.  No distension. There is no tenderness.  Skin: Positive thin shiny skin in the lower extremities with red discoloration on the proximal forearm on the left  Neuro: Mental Status: Patient is awake, alert, oriented to person, place,  year, and situation Patient is uable to give a clear and coherent history. No signs of aphasia or neglect Cranial Nerves: II: Visual Fields are full.  III,IV, VI: EOMI without ptosis or diploplia. Pupils equal, round and reactive to light V: Facial sensation is symmetric to temperature VII: Facial movement is symmetric.  VIII: hearing is intact to voice X: Palat elevates symmetrically XI: Shoulder shrug is symmetric. XII: tongue is midline without atrophy or fasciculations.  Motor: Right upper extremity shows 5/5 in the biceps 4/5 triceps.  The left upper extremity shows 4/5 throughout.  Right lower extremity shows 4/5 throughout.  Left lower extremity shows 2/5 with proximal hip flexion when leg is held out straight.  Bilateral dorsiflexion plantarflexion 5/5. Sensory: Sensation is symmetric to light touch and temperature in the arms and legs.  With stocking distribution neuropathy Deep Tendon Reflexes: 2+ and symmetric in the biceps and patellae.  Plantars: Mute bilaterally Cerebellar: Ataxia  in the left upper extremity  Labs I have reviewed labs in epic and the results pertinent to this consultation are:   CBC    Component Value Date/Time   WBC 11.6 (H) 12/19/2018 0624   RBC 3.19 (L) 12/19/2018 0624   HGB 10.0 (L) 12/19/2018 0624   HCT 30.7 (L) 12/19/2018 0624   HCT 34.5 11/12/2018 1345   PLT 282 12/19/2018 0624   MCV 96.2 12/19/2018 0624   MCH 31.3 12/19/2018 0624   MCHC 32.6 12/19/2018 0624   RDW 17.8 (H) 12/19/2018 0624   LYMPHSABS 1.2 12/18/2018 1056   MONOABS  0.5 12/18/2018 1056   EOSABS 1.3 (H) 12/18/2018 1056   BASOSABS 0.0 12/18/2018 1056    CMP     Component Value Date/Time   NA 132 (L) 12/19/2018 0624   K 3.9 12/19/2018 0624   CL 100 12/19/2018 0624   CO2 22 12/19/2018 0624   GLUCOSE 98 12/19/2018 0624   BUN 10 12/19/2018 0624   CREATININE 0.75 12/19/2018 0624   CALCIUM 8.2 (L) 12/19/2018 0624   PROT 4.6 (L) 12/18/2018 1056   ALBUMIN 1.6 (L) 12/18/2018 1056   AST 30 12/18/2018 1056   ALT 19 12/18/2018 1056   ALKPHOS 105 12/18/2018 1056   BILITOT 0.1 (L) 12/18/2018 1056   GFRNONAA >60 12/19/2018 0624   GFRAA >60 12/19/2018 0624    Lipid Panel     Component Value Date/Time   CHOL 178 11/19/2018 1609   TRIG 114 11/19/2018 1609   HDL 66 11/19/2018 1609   CHOLHDL 2.7 11/19/2018 1609   VLDL 23 11/19/2018 1609   LDLCALC 89 11/19/2018 1609   Blood cultures negative so far.   Stroke labs LDL 89 A1c-5.9 on 11/19/2018.  TEE from 11/20/2018 IMPRESSIONS 1. Left ventricular ejection fraction, by visual estimation, is 55 to 60%. The left ventricle has normal function. Normal left ventricular size. There is no left ventricular hypertrophy.  2. Global right ventricle has normal systolic function.The right ventricular size is normal. No increase in right ventricular wall thickness.  3. Left atrial size was normal.  4. Right atrial size was normal.  5. Moderate thickening of the mitral valve leaflet(s).  6. Small vegetation on the mitral valve.  7. The mitral valve is normal in structure. Mild mitral valve regurgitation. No evidence of mitral stenosis.  8. The tricuspid valve is normal in structure. Tricuspid valve regurgitation moderate.  9. The aortic valve is normal in structure. Aortic valve regurgitation is trivial by color flow Doppler. Mild to moderate aortic valve sclerosis/calcification without any evidence of aortic stenosis. 10. The pulmonic valve was normal in structure. Pulmonic valve regurgitation is not visualized by  color flow Doppler. 11. Normal pulmonary artery systolic pressure. 12. The inferior vena cava is normal in size with greater than 50% respiratory variability, suggesting right atrial pressure of 3 mmHg. 13. There is a very small mobile echodensity attached to the posterior mitral valve leaflet measuring 3.5 x 2 mm. While this might represent a degenerative process of the leaflet, in the settings of bacteremia it is highly suspicious of a vegetation.  Conservative therapy with antibiotics is recommended.  Imaging I have reviewed the images obtained: MRI examination of the brain-compared to MRI of 11/20/2018 there are new areas of acute infarct noted in the thalamus bilaterally, left frontal white matter and right lateral cerebellum  Etta Quill PA-C [facility] [phone]  M-F  (9:00 am- 5:00 PM)  12/19/2018, 1:40 PM    Attending addendum Patient  seen and examined Chart reviewed Imaging reviewed independently.  Assessment:  85 year old female presenting to the hospital with fever and altered mental status found to have pulmonary embolism in the proximal left upper lobe pulmonary artery and subsegmental right middle lobe pulmonary artery.   Recent stroke in October 2020 with residual dysarthria, left arm weakness left hand weakness and bilateral lower extremity weakness.  Due to the AMS, MRI brain was done and it revealed new areas of acute infarct in the thalamus bilaterally, left frontal white matter and right lateral cerebellum.    Patient thus far has been covered with antibiotics including vancomycin and cefepime along with started on heparin for the PE.  Exam today compared to previous exam on 11/20/2018 shows residual left upper extremity weakness however at this time shows left lower extremity weakness but resolved dysarthria and right lower extremity weakness.  Infarcts highly suspicious for embolic etiology.  Impression -New acute ischemic strokes -Currently  being treated for infective endocarditis -Pulmonary emboli  Recommendations: -CTA of head and neck-rule out mycotic aneurysms.  -2D echo currently being done, likely will need TEE if no thrombus is identified. -Due to the concern for mycotic aneurysms, antiplatelets and anticoagulants should be used with caution. She does need to be on anticoagulation for PE. I would hold off Aspirin for now until CTA is completed. -No need to repeat the whole stroke work-up was done was just done less than a month ago. -Treatment of endocarditis per primary team as you are.  Stroke team will follow in the morning and make further recommendations based on test results  -- Milon Dikes, MD [facility] Pager: [phone] If 7pm to 7am, please call on call as listed on AMION.

## 2018-12-19 NOTE — CV Procedure (Signed)
Echocardiogram not completed, patient is off the floor.  Darlina Sicilian RDCS

## 2018-12-19 NOTE — Consult Note (Signed)
Chief Complaint   Chief Complaint  Patient presents with   Fever   Altered Mental Status    HPI   Consult requested by: Dr Starla Link Reason for consult: lumbar osteomyelitis, discitis, epidural abscess  HPI: Autumn [name] is a 85 y.o. female with fairly complex recent past medical history including admission from 11/12/2018-11/26/2018 during which she was treated for acute ischemic left cerebellar CVA, MSSA bacteremia with signs of posterior mitral valve vegetation consistent with endocarditis with TEE on 11/20/2018 treated with IV Rocephin (last date tentatively 11/17) discharged to SNF, who presented 11/4 with altered mental status. She was found to be febrile and admitted for sepsis secondary to HCAP on vanc and cefepime, also found to have PE and started on heparin drip and new ischemic strokes. 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 NSY consultation requested. Of note, she did have an MRI of T/L spine during last hospitalization which was negative for infection.   She reports long standing history of chronic intermittent lower back pain. 2-3 weeks ago, she developed a "flare" although her current pain is more severe than typical flare. She denies LE symptoms, although has been bed bound since her CVA in September.    Patient Active Problem List   Diagnosis Date Noted   Pressure injury of skin 12/19/2018   Sepsis ([place]) 12/18/2018   HCAP (healthcare-associated pneumonia) 40/98/1191   Acute metabolic encephalopathy 47/82/9562   Macrocytic anemia 12/18/2018   Acute respiratory failure with hypoxia ([place]) 12/18/2018   Acute urinary retention    Acute on chronic combined systolic and diastolic CHF (congestive heart failure) (HCC)    Hypothyroidism    Real time reverse transcriptase PCR positive for severe acute respiratory syndrome coronavirus 2 (SARS-CoV-2) 11/25/2018    Palliative care encounter    MRSA bacteremia    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)    AKI (acute kidney injury) ([place])    Cerebral embolism with cerebral infarction 11/20/2018   Endocarditis of mitral valve    MSSA bacteremia 11/19/2018   Septic embolism (HCC)    Pain of upper abdomen    Postural dizziness with near syncope 11/13/2018   Tachypnea    Nausea and vomiting 11/12/2018   Bilateral hydronephrosis 11/12/2018   Elevated troponin 11/12/2018   Lactic acidosis 11/12/2018   Chronic neck and back pain 11/12/2018   Essential hypertension 11/12/2018   Normal coronary arteries 09/19/2013   LBBB (left bundle branch block) 09/19/2013   Congestive dilated cardiomyopathy ([place]) 07/24/2013    PMH: Past Medical History:  Diagnosis Date   Anxiety    GERD (gastroesophageal reflux disease)    Hypertension    Hypothyroid    LBBB (left bundle branch block)    MRSA bacteremia 11/19/2018   Osteopenia    Peripheral neuropathy    Sepsis ([place]) 12/2018   Venous insufficiency     PSH: Past Surgical History:  Procedure Laterality Date   ABDOMINAL HYSTERECTOMY     BLADDER SURGERY     LEFT HEART CATHETERIZATION WITH CORONARY ANGIOGRAM N/A 09/02/2013   Procedure: LEFT HEART CATHETERIZATION WITH CORONARY ANGIOGRAM;  Surgeon: Minus Breeding, MD;  Location: [facility] CATH LAB;  Service: Cardiovascular;  Laterality: N/A;   RENAL ANGIOGRAM N/A 09/02/2013   Procedure: RENAL ANGIOGRAM;  Surgeon: Minus Breeding, MD;  Location: [facility] CATH LAB;  Service: Cardiovascular;  Laterality: N/A;   TEE WITHOUT CARDIOVERSION N/A 11/20/2018  Procedure: TRANSESOPHAGEAL ECHOCARDIOGRAM (TEE);  Surgeon: Dorothy Spark, MD;  Location: [facility] ENDOSCOPY;  Service: Cardiovascular;  Laterality: N/A;    Medications Prior to Admission  Medication Sig Dispense Refill Last Dose   Ascorbic Acid (VITAMIN C PO) Take 500 mg by mouth daily.    12/18/2018 at Unknown  time   aspirin 325 MG tablet Take 1 tablet (325 mg total) by mouth daily. 30 tablet 0 12/18/2018 at Unknown time   atorvastatin (LIPITOR) 20 MG tablet Take 20 mg by mouth at bedtime.   12/17/2018   carvedilol (COREG) 12.5 MG tablet Take 1 tablet (12.5 mg total) by mouth 2 (two) times daily. 60 tablet 0 12/18/2018 at 0900   cefTRIAXone (ROCEPHIN) IVPB Inject 2 g into the vein every 12 (twelve) hours. Indication: Mitral valve endocarditis with septic embolization Last Day of Therapy:  12/31/2018 Labs - Once weekly:  CBC/D and BMP, Labs - Every other week:  ESR and CRP 70 Units 0 12/18/2018 at Unknown time   Cholecalciferol (VITAMIN D PO) Take 1,000 Units by mouth daily.    12/18/2018 at Unknown time   DULoxetine (CYMBALTA) 30 MG capsule Take 30 mg by mouth daily.   12/18/2018 at Unknown time   HYDROcodone-acetaminophen (NORCO/VICODIN) 5-325 MG tablet Take 2 tablets by mouth every 4 (four) hours as needed for severe pain.   12/18/2018 at Unknown time   levothyroxine (SYNTHROID, LEVOTHROID) 50 MCG tablet Take 50 mcg by mouth daily before breakfast.   12/18/2018 at Unknown time   lidocaine (LIDODERM) 5 % Place 1 patch onto the skin 2 (two) times daily. Remove & Discard patch within 12 hours or as directed by MD   12/18/2018 at Unknown time   LORazepam (ATIVAN) 0.5 MG tablet Take 0.5 mg by mouth 2 (two) times daily.   12/18/2018 at Unknown time   losartan (COZAAR) 100 MG tablet Take 1 tablet (100 mg total) by mouth daily. 30 tablet 0 12/18/2018 at Unknown time   magnesium oxide (MAG-OX) 400 MG tablet Take 400 mg by mouth at bedtime.   12/17/2018   MELATONIN PO Take 1 mg by mouth at bedtime.    12/17/2018   Multiple Vitamins-Minerals (HAIR SKIN AND NAILS FORMULA PO) Take 1 tablet by mouth daily.   12/18/2018 at Unknown time   Nutritional Supplements (BOOST BREEZE PO) Take 1 Can by mouth 2 (two) times daily.   12/18/2018 at Unknown time   pantoprazole (PROTONIX) 40 MG tablet Take 1 tablet (40 mg total)  by mouth 2 (two) times daily. Take 1 tablet 40 mg BID x 6 weeks then 1 tab 40 mg daily afterwards (Patient taking differently: Take 40 mg by mouth 2 (two) times daily. Take 1 tablet 40 mg BID x 6 weeks (start 10.15.20), then 1 tab 40 mg daily afterwards) 90 tablet 0 12/18/2018 at Unknown time   polyethylene glycol (MIRALAX / GLYCOLAX) 17 g packet Take 17 g by mouth at bedtime.   12/17/2018 at Unknown time   potassium chloride SA (KLOR-CON) 20 MEQ tablet Take 20 mEq by mouth daily.   12/18/2018 at Unknown time   senna-docusate (SENOKOT-S) 8.6-50 MG tablet Take 2 tablets by mouth 2 (two) times daily. (Patient taking differently: Take 1 tablet by mouth 2 (two) times daily. ) 14 tablet 0 12/18/2018 at Unknown time   traMADol (ULTRAM) 50 MG tablet Take 1 tablet (50 mg total) by mouth daily as needed for severe pain. 4 tablet 0 unk   vitamin B-12 (CYANOCOBALAMIN) 500 MCG tablet  Take 500 mcg by mouth daily.    12/18/2018 at Unknown time   diclofenac sodium (VOLTAREN) 1 % GEL Apply 4 g topically 4 (four) times daily. (Patient not taking: Reported on 12/19/2018) 100 g 1 Not Taking at Unknown time   feeding supplement, ENSURE ENLIVE, (ENSURE ENLIVE) LIQD Take 237 mLs by mouth 3 (three) times daily between meals. (Patient not taking: Reported on 12/19/2018) 237 mL 0 Not Taking at Unknown time    SH: Social History   Tobacco Use   Smoking status: Never Smoker   Smokeless tobacco: Never Used  Substance Use Topics   Alcohol use: Yes    Frequency: Never    Comment: Once every three months    Drug use: No    MEDS: Prior to Admission medications   Medication Sig Start Date End Date Taking? Authorizing Provider  Ascorbic Acid (VITAMIN C PO) Take 500 mg by mouth daily.    Yes [provider]  aspirin 325 MG tablet Take 1 tablet (325 mg total) by mouth daily. 11/15/18  Yes Hall, Carole N, DO  atorvastatin (LIPITOR) 20 MG tablet Take 20 mg by mouth at bedtime.   Yes [provider]    carvedilol (COREG) 12.5 MG tablet Take 1 tablet (12.5 mg total) by mouth 2 (two) times daily. 11/15/18  Yes Irene Pap N, DO  cefTRIAXone (ROCEPHIN) IVPB Inject 2 g into the vein every 12 (twelve) hours. Indication: Mitral valve endocarditis with septic embolization Last Day of Therapy:  12/31/2018 Labs - Once weekly:  CBC/D and BMP, Labs - Every other week:  ESR and CRP 11/26/18 12/31/18 Yes Adhikari, Tamsen Meek, MD  Cholecalciferol (VITAMIN D PO) Take 1,000 Units by mouth daily.    Yes [provider]  DULoxetine (CYMBALTA) 30 MG capsule Take 30 mg by mouth daily.   Yes [provider]  HYDROcodone-acetaminophen (NORCO/VICODIN) 5-325 MG tablet Take 2 tablets by mouth every 4 (four) hours as needed for severe pain.   Yes [provider]  levothyroxine (SYNTHROID, LEVOTHROID) 50 MCG tablet Take 50 mcg by mouth daily before breakfast.   Yes [provider]  lidocaine (LIDODERM) 5 % Place 1 patch onto the skin 2 (two) times daily. Remove & Discard patch within 12 hours or as directed by MD   Yes [provider]  LORazepam (ATIVAN) 0.5 MG tablet Take 0.5 mg by mouth 2 (two) times daily.   Yes [provider]  losartan (COZAAR) 100 MG tablet Take 1 tablet (100 mg total) by mouth daily. 11/15/18  Yes Irene Pap N, DO  magnesium oxide (MAG-OX) 400 MG tablet Take 400 mg by mouth at bedtime.   Yes [provider]  MELATONIN PO Take 1 mg by mouth at bedtime.    Yes [provider]  Multiple Vitamins-Minerals (HAIR SKIN AND NAILS FORMULA PO) Take 1 tablet by mouth daily.   Yes [provider]  Nutritional Supplements (BOOST BREEZE PO) Take 1 Can by mouth 2 (two) times daily.   Yes [provider]  pantoprazole (PROTONIX) 40 MG tablet Take 1 tablet (40 mg total) by mouth 2 (two) times daily. Take 1 tablet 40 mg BID x 6 weeks then 1 tab 40 mg daily afterwards Patient taking differently: Take 40 mg by mouth 2 (two) times  daily. Take 1 tablet 40 mg BID x 6 weeks (start 10.15.20), then 1 tab 40 mg daily afterwards 11/15/18  Yes Hall, Carole N, DO  polyethylene glycol (MIRALAX / GLYCOLAX) 17 g  packet Take 17 g by mouth at bedtime.   Yes [provider]  potassium chloride SA (KLOR-CON) 20 MEQ tablet Take 20 mEq by mouth daily.   Yes [provider]  senna-docusate (SENOKOT-S) 8.6-50 MG tablet Take 2 tablets by mouth 2 (two) times daily. Patient taking differently: Take 1 tablet by mouth 2 (two) times daily.  11/17/18  Yes Hall, Lorenda Cahill, DO  traMADol (ULTRAM) 50 MG tablet Take 1 tablet (50 mg total) by mouth daily as needed for severe pain. 11/17/18  Yes Kayleen Memos, DO  vitamin B-12 (CYANOCOBALAMIN) 500 MCG tablet Take 500 mcg by mouth daily.    Yes [provider]  diclofenac sodium (VOLTAREN) 1 % GEL Apply 4 g topically 4 (four) times daily. Patient not taking: Reported on 12/19/2018 10/02/18   Blanchie Dessert, MD  feeding supplement, ENSURE ENLIVE, (ENSURE ENLIVE) LIQD Take 237 mLs by mouth 3 (three) times daily between meals. Patient not taking: Reported on 12/19/2018 11/28/18   Eugenie Filler, MD    ALLERGY: Allergies  Allergen Reactions   Chlorhexidine    Hydrochlorothiazide Other (See Comments)    REACTION: hyponatremia   Ramipril Other (See Comments)    REACTION: GI upset   Remeron [Mirtazapine] Other (See Comments)    REACTION: weakness   Sulfa Antibiotics Rash    Social History   Tobacco Use   Smoking status: Never Smoker   Smokeless tobacco: Never Used  Substance Use Topics   Alcohol use: Yes    Frequency: Never    Comment: Once every three months      Family History  Problem Relation Age of Onset   Hypertension Mother    Prostate cancer Father      ROS   ROS  Exam   Vitals:   12/19/18 1444 12/19/18 2024  BP:    Pulse: 79   Resp: 20   Temp: 98.6 F (37 C) 98.1 F (36.7 C)  SpO2: 96%    General appearance: WDWN, NAD Eyes: No  scleral injection Cardiovascular: Regular rate and rhythm without murmurs, rubs, gallops. No edema or variciosities. Distal pulses normal. Pulmonary: Effort normal, non-labored breathing Musculoskeletal:     Muscle tone upper extremities: Normal    Muscle tone lower extremities: Normal    Motor exam: Upper Extremities Deltoid Bicep Tricep Grip  Right 5/5 5/5 4/5 5/5  Left 4/5 4/5 4/5 4/5   Lower Extremity IP Quad PF DF EHL  Right 4/5 4/5 4/5 4/5 4/5  Left 2/5 4/5 5/5 5/5 5/5   Neurological Mental Status:    - Patient is awake, alert, oriented to person, place, month, year, and situation    - Patient is able to give a clear and coherent history.    - No signs of aphasia or neglect Cranial Nerves    - II: Visual Fields are full. PERRL    - III/IV/VI: EOMI without ptosis or diploplia.     - V: Facial sensation is grossly normal    - VII: Facial movement is symmetric.     - VIII: hearing is intact to voice    - X: Uvula elevates symmetrically    - XI: Shoulder shrug is symmetric.    - XII: tongue is midline without atrophy or fasciculations.  Sensory: Sensation grossly intact to LT Deep Tendon Reflexes    - 2+ and symmetric in the biceps and patellae.  Plantars   - Toes are downgoing bilaterally.   Results - Imaging/Labs  Results for orders placed or performed during the hospital encounter of 12/18/18 (from the past 48 hour(s))  Blood Culture (routine x 2)     Status: None (Preliminary result)   Collection Time: 12/18/18 10:47 AM   Specimen: BLOOD RIGHT ARM  Result Value Ref Range   Specimen Description BLOOD RIGHT ARM    Special Requests      BOTTLES DRAWN AEROBIC AND ANAEROBIC Blood Culture adequate volume   Culture      NO GROWTH 1 DAY Performed at [facility], [place] [address], [place], [place] [address]    Report Status PENDING   Lactic acid, plasma     Status: Abnormal   Collection Time: 12/18/18 10:56 AM  Result Value Ref Range   Lactic Acid, Venous 2.0  (HH) 0.5 - 1.9 mmol/L    Comment: CRITICAL RESULT CALLED TO, READ BACK BY AND VERIFIED WITH: [facility] RN @ 1133 12/18/18 LEONARD,A Performed at [facility], [place] [address], [place], [place] [address]   Comprehensive metabolic panel     Status: Abnormal   Collection Time: 12/18/18 10:56 AM  Result Value Ref Range   Sodium 133 (L) 135 - 145 mmol/L   Potassium 4.3 3.5 - 5.1 mmol/L   Chloride 102 98 - 111 mmol/L   CO2 22 22 - 32 mmol/L   Glucose, Bld 151 (H) 70 - 99 mg/dL   BUN 9 8 - 23 mg/dL   Creatinine, Ser 0.85 0.44 - 1.00 mg/dL   Calcium 8.0 (L) 8.9 - 10.3 mg/dL   Total Protein 4.6 (L) 6.5 - 8.1 g/dL   Albumin 1.6 (L) 3.5 - 5.0 g/dL   AST 30 15 - 41 U/L   ALT 19 0 - 44 U/L   Alkaline Phosphatase 105 38 - 126 U/L   Total Bilirubin 0.1 (L) 0.3 - 1.2 mg/dL   GFR calc non Af Amer >60 >60 mL/min   GFR calc Af Amer >60 >60 mL/min   Anion gap 9 5 - 15    Comment: Performed at [facility], [place] [address] [place], [place] [address]  CBC WITH DIFFERENTIAL     Status: Abnormal   Collection Time: 12/18/18 10:56 AM  Result Value Ref Range   WBC 8.7 4.0 - 10.5 K/uL   RBC 2.37 (L) 3.87 - 5.11 MIL/uL   Hemoglobin 7.9 (L) 12.0 - 15.0 g/dL   HCT 24.9 (L) 36.0 - 46.0 %   MCV 105.1 (H) 80.0 - 100.0 fL   MCH 33.3 26.0 - 34.0 pg   MCHC 31.7 30.0 - 36.0 g/dL   RDW 15.4 11.5 - 15.5 %   Platelets 261 150 - 400 K/uL   nRBC 0.0 0.0 - 0.2 %   Neutrophils Relative % 65 %   Neutro Abs 5.6 1.7 - 7.7 K/uL   Lymphocytes Relative 14 %   Lymphs Abs 1.2 0.7 - 4.0 K/uL   Monocytes Relative 5 %   Monocytes Absolute 0.5 0.1 - 1.0 K/uL   Eosinophils Relative 15 %   Eosinophils Absolute 1.3 (H) 0.0 - 0.5 K/uL   Basophils Relative 0 %   Basophils Absolute 0.0 0.0 - 0.1 K/uL   Immature Granulocytes 1 %   Abs Immature Granulocytes 0.05 0.00 - 0.07 K/uL    Comment: Performed at [facility], [address], [place], [place] [address]  APTT     Status: Abnormal   Collection Time:  12/18/18 10:56 AM  Result Value Ref  Range   aPTT 41 (H) 24 - 36 seconds    Comment:        IF BASELINE aPTT IS ELEVATED, SUGGEST PATIENT RISK ASSESSMENT BE USED TO DETERMINE APPROPRIATE ANTICOAGULANT THERAPY. Performed at [facility], [place] [address], [place], [place] [address]   Protime-INR     Status: None   Collection Time: 12/18/18 10:56 AM  Result Value Ref Range   Prothrombin Time 15.1 11.4 - 15.2 seconds   INR 1.2 0.8 - 1.2    Comment: (NOTE) INR goal varies based on device and disease states. Performed at [facility], [place] [address], [place], [place] [address]   Procalcitonin     Status: None   Collection Time: 12/18/18 10:56 AM  Result Value Ref Range   Procalcitonin 0.13 ng/mL    Comment:        Interpretation: PCT (Procalcitonin) <= 0.5 ng/mL: Systemic infection (sepsis) is not likely. Local bacterial infection is possible. (NOTE)       Sepsis PCT Algorithm           Lower Respiratory Tract                                      Infection PCT Algorithm    ----------------------------     ----------------------------         PCT < 0.25 ng/mL                PCT < 0.10 ng/mL         Strongly encourage             Strongly discourage   discontinuation of antibiotics    initiation of antibiotics    ----------------------------     -----------------------------       PCT 0.25 - 0.50 ng/mL            PCT 0.10 - 0.25 ng/mL               OR       >80% decrease in PCT            Discourage initiation of                                            antibiotics      Encourage discontinuation           of antibiotics    ----------------------------     -----------------------------         PCT >= 0.50 ng/mL              PCT 0.26 - 0.50 ng/mL               AND        <80% decrease in PCT             Encourage initiation of                                             antibiotics       Encourage continuation           of antibiotics     ----------------------------     -----------------------------  PCT >= 0.50 ng/mL                  PCT > 0.50 ng/mL               AND         increase in PCT                  Strongly encourage                                      initiation of antibiotics    Strongly encourage escalation           of antibiotics                                     -----------------------------                                           PCT <= 0.25 ng/mL                                                 OR                                        > 80% decrease in PCT                                     Discontinue / Do not initiate                                             antibiotics Performed at [facility], [address] [place], [place] [address]   D-dimer, quantitative (not at [facility])     Status: Abnormal   Collection Time: 12/18/18 10:56 AM  Result Value Ref Range   D-Dimer, Quant 3.17 (H) 0.00 - 0.50 ug/mL-FEU    Comment: (NOTE) At the manufacturer cut-off of 0.50 ug/mL FEU, this assay has been documented to exclude PE with a sensitivity and negative predictive value of 97 to 99%.  At this time, this assay has not been approved by the FDA to exclude DVT/VTE. Results should be correlated with clinical presentation. Performed at [facility], [place] [address], [place], [place] [address]   SARS Coronavirus 2 by RT PCR (hospital order, performed in [facility] hospital lab) Nasopharyngeal Nasopharyngeal Swab     Status: None   Collection Time: 12/18/18 12:13 PM   Specimen: Nasopharyngeal Swab  Result Value Ref Range   SARS Coronavirus 2 NEGATIVE NEGATIVE    Comment: (NOTE) If result is NEGATIVE SARS-CoV-2 target nucleic acids are NOT DETECTED. The SARS-CoV-2 RNA is generally detectable in upper and lower  respiratory specimens during the acute phase of infection. The lowest  concentration of SARS-CoV-2 viral copies this assay  can detect is 250  copies / mL. A negative result  does not preclude SARS-CoV-2 infection  and should not be used as the sole basis for treatment or other  patient management decisions.  A negative result may occur with  improper specimen collection / handling, submission of specimen other  than nasopharyngeal swab, presence of viral mutation(s) within the  areas targeted by this assay, and inadequate number of viral copies  (<250 copies / mL). A negative result must be combined with clinical  observations, patient history, and epidemiological information. If result is POSITIVE SARS-CoV-2 target nucleic acids are DETECTED. The SARS-CoV-2 RNA is generally detectable in upper and lower  respiratory specimens dur ing the acute phase of infection.  Positive  results are indicative of active infection with SARS-CoV-2.  Clinical  correlation with patient history and other diagnostic information is  necessary to determine patient infection status.  Positive results do  not rule out bacterial infection or co-infection with other viruses. If result is PRESUMPTIVE POSTIVE SARS-CoV-2 nucleic acids MAY BE PRESENT.   A presumptive positive result was obtained on the submitted specimen  and confirmed on repeat testing.  While 2019 novel coronavirus  (SARS-CoV-2) nucleic acids may be present in the submitted sample  additional confirmatory testing may be necessary for epidemiological  and / or clinical management purposes  to differentiate between  SARS-CoV-2 and other Sarbecovirus currently known to infect humans.  If clinically indicated additional testing with an alternate test  methodology [id number]) is advised. The SARS-CoV-2 RNA is generally  detectable in upper and lower respiratory sp ecimens during the acute  phase of infection. The expected result is Negative. Fact Sheet for Patients:  StrictlyIdeas.no Fact Sheet for Healthcare Providers: BankingDealers.co.za This test is not yet approved or  cleared by the [place] FDA and has been authorized for detection and/or diagnosis of SARS-CoV-2 by FDA under an Emergency Use Authorization (EUA).  This EUA will remain in effect (meaning this test can be used) for the duration of the COVID-19 declaration under Section 564(b)(1) of the Act, 21 U.S.C. section 360bbb-3(b)(1), unless the authorization is terminated or revoked sooner. Performed at [facility], [place] [address] [place], [place] [address]   Troponin I (High Sensitivity)     Status: None   Collection Time: 12/18/18 12:15 PM  Result Value Ref Range   Troponin I (High Sensitivity) 8 <18 ng/L    Comment: (NOTE) Elevated high sensitivity troponin I (hsTnI) values and significant  changes across serial measurements may suggest ACS but many other  chronic and acute conditions are known to elevate hsTnI results.  Refer to the "Links" section for chest pain algorithms and additional  guidance. Performed at [facility], [place] [address], [place], [place] [address]   Blood Culture (routine x 2)     Status: None (Preliminary result)   Collection Time: 12/18/18 12:31 PM   Specimen: BLOOD RIGHT HAND  Result Value Ref Range   Specimen Description BLOOD RIGHT HAND    Special Requests      BOTTLES DRAWN AEROBIC ONLY Blood Culture results may not be optimal due to an inadequate volume of blood received in culture bottles   Culture      NO GROWTH < 24 HOURS Performed at [place] [address], [place], [place] [address]    Report Status PENDING   Lactic acid, plasma     Status: None   Collection Time: 12/18/18 12:44 PM  Result  Value Ref Range   Lactic Acid, Venous 1.4 0.5 - 1.9 mmol/L    Comment: Performed at [place] [address] [place], [place] [address]  POCT I-Stat EG7     Status: Abnormal   Collection Time: 12/18/18 12:51 PM  Result Value Ref Range   pH, Ven 7.407 7.250 - 7.430   pCO2, Ven 38.8 (L) 44.0 - 60.0 mmHg   pO2, Ven 129.0  (H) 32.0 - 45.0 mmHg   Bicarbonate 24.4 20.0 - 28.0 mmol/L   TCO2 26 22 - 32 mmol/L   O2 Saturation 99.0 %   Sodium 133 (L) 135 - 145 mmol/L   Potassium 4.5 3.5 - 5.1 mmol/L   Calcium, Ion 1.18 1.15 - 1.40 mmol/L   HCT 22.0 (L) 36.0 - 46.0 %   Hemoglobin 7.5 (L) 12.0 - 15.0 g/dL   Patient temperature HIDE    Sample type VENOUS   Urinalysis, Routine w reflex microscopic     Status: Abnormal   Collection Time: 12/18/18  3:20 PM  Result Value Ref Range   Color, Urine YELLOW YELLOW   APPearance CLEAR CLEAR   Specific Gravity, Urine 1.029 1.005 - 1.030   pH 7.0 5.0 - 8.0   Glucose, UA NEGATIVE NEGATIVE mg/dL   Hgb urine dipstick NEGATIVE NEGATIVE   Bilirubin Urine NEGATIVE NEGATIVE   Ketones, ur NEGATIVE NEGATIVE mg/dL   Protein, ur NEGATIVE NEGATIVE mg/dL   Nitrite NEGATIVE NEGATIVE   Leukocytes,Ua TRACE (A) NEGATIVE   RBC / HPF 11-20 0 - 5 RBC/hpf   WBC, UA 21-50 0 - 5 WBC/hpf   Bacteria, UA RARE (A) NONE SEEN   Mucus PRESENT    Budding Yeast PRESENT     Comment: Performed at [facility], [place] [address] [place], [place] [id number]  Urine culture     Status: Abnormal   Collection Time: 12/18/18  3:26 PM   Specimen: In/Out Cath Urine  Result Value Ref Range   Specimen Description IN/OUT CATH URINE    Special Requests      NONE Performed at [facility], [place] [address], [place], [place] [address]    Culture 20,000 COLONIES/mL YEAST (A)    Report Status 12/19/2018 FINAL   Troponin I (High Sensitivity)     Status: None   Collection Time: 12/18/18  4:27 PM  Result Value Ref Range   Troponin I (High Sensitivity) 7 <18 ng/L    Comment: (NOTE) Elevated high sensitivity troponin I (hsTnI) values and significant  changes across serial measurements may suggest ACS but many other  chronic and acute conditions are known to elevate hsTnI results.  Refer to the "Links" section for chest pain algorithms and additional  guidance. Performed at [facility], [place] [address], [place], [place] [address]   Type and screen [place]     Status: None   Collection Time: 12/18/18  6:18 PM  Result Value Ref Range   ABO/RH(D) A POS    Antibody Screen NEG    Sample Expiration 12/21/2018,2359    Unit Number [id number]    Blood Component Type RED CELLS,LR    Unit division 00    Status of Unit ISSUED,FINAL    Transfusion Status OK TO TRANSFUSE    Crossmatch Result      Compatible Performed at [facility], [place] [address], [place], [place] [address]   Prepare RBC     Status: None   Collection  Time: 12/18/18  6:18 PM  Result Value Ref Range   Order Confirmation      ORDER PROCESSED BY BLOOD BANK Performed at [facility], [place] [address] [place], [place] [address]   Brain natriuretic peptide     Status: Abnormal   Collection Time: 12/18/18  6:18 PM  Result Value Ref Range   B Natriuretic Peptide 291.9 (H) 0.0 - 100.0 pg/mL    Comment: Performed at [facility] [address], [place], [place] [address]  ABO/Rh     Status: None   Collection Time: 12/18/18  6:18 PM  Result Value Ref Range   ABO/RH(D)      A POS Performed at [facility] [address] [place], [place] [address]   POC occult blood, ED     Status: None   Collection Time: 12/18/18 11:07 PM  Result Value Ref Range   Fecal Occult Bld NEGATIVE NEGATIVE  Heparin level (unfractionated)     Status: Abnormal   Collection Time: 12/19/18  1:21 AM  Result Value Ref Range   Heparin Unfractionated 0.96 (H) 0.30 - 0.70 IU/mL    Comment: Performed at [facility], [place] [address] [place], [place] [address]  Heparin level (unfractionated)     Status: Abnormal   Collection Time: 12/19/18  5:00 AM  Result Value Ref Range   Heparin Unfractionated 0.88 (H) 0.30 - 0.70 IU/mL    Comment: (NOTE) If heparin results are below expected values, and patient dosage has  been confirmed, suggest follow up testing of antithrombin III levels. Performed at  [facility], [place] [address] [place], [place] [address]   CBC     Status: Abnormal   Collection Time: 12/19/18  6:24 AM  Result Value Ref Range   WBC 11.6 (H) 4.0 - 10.5 K/uL   RBC 3.19 (L) 3.87 - 5.11 MIL/uL   Hemoglobin 10.0 (L) 12.0 - 15.0 g/dL    Comment: REPEATED TO VERIFY POST TRANSFUSION SPECIMEN    HCT 30.7 (L) 36.0 - 46.0 %   MCV 96.2 80.0 - 100.0 fL    Comment: POST TRANSFUSION SPECIMEN REPEATED TO VERIFY    MCH 31.3 26.0 - 34.0 pg   MCHC 32.6 30.0 - 36.0 g/dL   RDW 17.8 (H) 11.5 - 15.5 %   Platelets 282 150 - 400 K/uL   nRBC 0.0 0.0 - 0.2 %    Comment: Performed at [facility], [place] [address], [place], [place] [address]  Basic metabolic panel     Status: Abnormal   Collection Time: 12/19/18  6:24 AM  Result Value Ref Range   Sodium 132 (L) 135 - 145 mmol/L   Potassium 3.9 3.5 - 5.1 mmol/L   Chloride 100 98 - 111 mmol/L   CO2 22 22 - 32 mmol/L   Glucose, Bld 98 70 - 99 mg/dL   BUN 10 8 - 23 mg/dL   Creatinine, Ser 0.75 0.44 - 1.00 mg/dL   Calcium 8.2 (L) 8.9 - 10.3 mg/dL   GFR calc non Af Amer >60 >60 mL/min   GFR calc Af Amer >60 >60 mL/min   Anion gap 10 5 - 15    Comment: Performed at [facility], [place] [address] [place], [place] [address]  Heparin level     Status: None   Collection Time: 12/19/18  2:32 PM  Result Value Ref Range   Heparin Unfractionated 0.69 0.30 - 0.70 IU/mL  Comment: (NOTE) If heparin results are below expected values, and patient dosage has  been confirmed, suggest follow up testing of antithrombin III levels. Performed at [facility], [place] [address], [place], [place] [address]     Ct Angio Head W Or Wo Contrast  Addendum Date: 12/19/2018   ADDENDUM REPORT: 12/19/2018 21:03 ADDENDUM: Critical Value/emergent results were called by telephone at the time of interpretation on 12/19/2018 at 8:58 pm to provider Dr. Kennon Holter, Who verbally acknowledged these results. Electronically Signed   By: Jeannine Boga M.D.   On: 12/19/2018 21:03   Result Date: 12/19/2018 CLINICAL DATA:  Initial evaluation for acute stroke, altered mental status. EXAM: CT ANGIOGRAPHY HEAD AND NECK TECHNIQUE: Multidetector CT imaging of the head and neck was performed using the standard protocol during bolus administration of intravenous contrast. Multiplanar CT image reconstructions and MIPs were obtained to evaluate the vascular anatomy. Carotid stenosis measurements (when applicable) are obtained utilizing NASCET criteria, using the distal internal carotid diameter as the denominator. CONTRAST:  63m OMNIPAQUE IOHEXOL 350 MG/ML SOLN COMPARISON:  Prior MRI from earlier same day as well as recent CTA from 11/20/2018. FINDINGS: CT HEAD FINDINGS Brain: Age-related cerebral atrophy with chronic small vessel ischemic disease, stable. Remote left cerebellar infarct noted. Known small acute infarcts within the left frontal white matter, bilateral thalami, and right cerebellum are not well seen. No evidence for hemorrhagic transformation. No acute intracranial hemorrhage elsewhere. No other acute large vessel territory infarct. No mass lesion, midline shift or mass effect. No hydrocephalus. No extra-axial fluid collection. Vascular: No hyperdense vessel. Calcified atherosclerosis at the skull base. Skull: Scalp soft tissues and calvarium within normal limits. Calvarium intact. Sinuses: Paranasal sinuses are clear. Chronic right mastoid effusion, stable. Orbits: Globes normal soft tissues demonstrate no acute finding. Review of the MIP images confirms the above findings CTA NECK FINDINGS Aortic arch: Visualized aortic arch of normal caliber with normal 3 vessel morphology. Mild-to-moderate atherosclerotic change about the aortic arch and origin of the great vessels up to approximately 50% stenosis at the proximal left subclavian artery again noted, stable. No other hemodynamically significant stenosis about the proximal great vessels.  Visualized subclavian arteries otherwise widely patent. Right carotid system: Right CCA tortuous but widely patent to the bifurcation without stenosis. Mild a centric mixed plaque about the right bifurcation without hemodynamically significant stenosis. Right ICA widely patent distally to the skull base without stenosis, dissection or occlusion. Left carotid system: Left CCA tortuous but widely patent to the bifurcation without stenosis. No significant atheromatous narrowing of the left bifurcation. Left ICA tortuous with mild atheromatous irregularity but is widely patent without stenosis, dissection or occlusion. Vertebral arteries: Both vertebral arteries arise from the subclavian arteries. Left vertebral artery slightly dominant. Vertebral arteries patent within the neck without stenosis, dissection or occlusion. Skeleton: No acute osseous finding. No discrete lytic or blastic osseous lesions. Moderate to advanced cervical spondylosis at C3-4 through C6-7. Grade 1 facet mediated anterolisthesis of C7 on T1, stable. Other neck: No other acute soft tissue abnormality within the neck. Upper chest: Small focal filling defects seen within a left upper lobe segmental pulmonary artery, consistent with acute pulmonary embolism (series 9, image 160). No other visible pulmonary emboli. Moderate layering bilateral pleural effusions with associated atelectasis. Underlying mild pulmonary interstitial edema. Review of the MIP images confirms the above findings CTA HEAD FINDINGS Anterior circulation: Petrous segments widely patent. Calcified atheromatous plaque within the carotid siphons with no more than mild multifocal stenosis. ICA termini  well perfused. A1 segments patent bilaterally. Normal anterior communicating artery complex. Anterior cerebral arteries widely patent to their distal aspects. No M1 stenosis or occlusion. Negative MCA bifurcations. Distal MCA branches well perfused and symmetric. Posterior circulation:  Dominant left vertebral artery demonstrates scattered atheromatous irregularity but is patent to the vertebrobasilar junction without high-grade stenosis. Patent left PICA. Hypoplastic right vertebral artery largely terminates in PICA. Right PICA patent as well. Basilar artery diffusely diminutive but patent to its distal aspect, stable. Superior cerebral arteries patent bilaterally. Predominant fetal type origin of the PCAs. Right PCA patent to its distal aspect without stenosis. Short-segment severe proximal left P2 stenosis, unchanged. Venous sinuses: Grossly patent allowing for timing the contrast bolus. Anatomic variants: Predominant fetal type origin of the PCAs with overall diminutive vertebrobasilar system. Review of the MIP images confirms the above findings IMPRESSION: 1. Stable CTA of the head and neck as compared to recent exam from 11/20/2018. No emergent large vessel occlusion. 2. Short-segment severe proximal left P2 stenosis, stable. 3. Predominant fetal type origin of the PCAs with overall diminutive vertebrobasilar system. 4. Focal filling defect within a left upper lobe segmental pulmonary artery, consistent with a small acute PE. 5. Moderate layering bilateral pleural effusions with associated atelectasis and pulmonary interstitial edema. Current attempt is being made to contact the covering clinician regarding these findings. Results will be conveyed as soon as possible. Electronically Signed: By: Jeannine Boga M.D. On: 12/19/2018 20:50   Ct Angio Neck W Or Wo Contrast  Addendum Date: 12/19/2018   ADDENDUM REPORT: 12/19/2018 21:03 ADDENDUM: Critical Value/emergent results were called by telephone at the time of interpretation on 12/19/2018 at 8:58 pm to provider Dr. Kennon Holter, Who verbally acknowledged these results. Electronically Signed   By: Jeannine Boga M.D.   On: 12/19/2018 21:03   Result Date: 12/19/2018 CLINICAL DATA:  Initial evaluation for acute stroke, altered mental  status. EXAM: CT ANGIOGRAPHY HEAD AND NECK TECHNIQUE: Multidetector CT imaging of the head and neck was performed using the standard protocol during bolus administration of intravenous contrast. Multiplanar CT image reconstructions and MIPs were obtained to evaluate the vascular anatomy. Carotid stenosis measurements (when applicable) are obtained utilizing NASCET criteria, using the distal internal carotid diameter as the denominator. CONTRAST:  60m OMNIPAQUE IOHEXOL 350 MG/ML SOLN COMPARISON:  Prior MRI from earlier same day as well as recent CTA from 11/20/2018. FINDINGS: CT HEAD FINDINGS Brain: Age-related cerebral atrophy with chronic small vessel ischemic disease, stable. Remote left cerebellar infarct noted. Known small acute infarcts within the left frontal white matter, bilateral thalami, and right cerebellum are not well seen. No evidence for hemorrhagic transformation. No acute intracranial hemorrhage elsewhere. No other acute large vessel territory infarct. No mass lesion, midline shift or mass effect. No hydrocephalus. No extra-axial fluid collection. Vascular: No hyperdense vessel. Calcified atherosclerosis at the skull base. Skull: Scalp soft tissues and calvarium within normal limits. Calvarium intact. Sinuses: Paranasal sinuses are clear. Chronic right mastoid effusion, stable. Orbits: Globes normal soft tissues demonstrate no acute finding. Review of the MIP images confirms the above findings CTA NECK FINDINGS Aortic arch: Visualized aortic arch of normal caliber with normal 3 vessel morphology. Mild-to-moderate atherosclerotic change about the aortic arch and origin of the great vessels up to approximately 50% stenosis at the proximal left subclavian artery again noted, stable. No other hemodynamically significant stenosis about the proximal great vessels. Visualized subclavian arteries otherwise widely patent. Right carotid system: Right CCA tortuous but widely patent to the bifurcation without  stenosis.  Mild a centric mixed plaque about the right bifurcation without hemodynamically significant stenosis. Right ICA widely patent distally to the skull base without stenosis, dissection or occlusion. Left carotid system: Left CCA tortuous but widely patent to the bifurcation without stenosis. No significant atheromatous narrowing of the left bifurcation. Left ICA tortuous with mild atheromatous irregularity but is widely patent without stenosis, dissection or occlusion. Vertebral arteries: Both vertebral arteries arise from the subclavian arteries. Left vertebral artery slightly dominant. Vertebral arteries patent within the neck without stenosis, dissection or occlusion. Skeleton: No acute osseous finding. No discrete lytic or blastic osseous lesions. Moderate to advanced cervical spondylosis at C3-4 through C6-7. Grade 1 facet mediated anterolisthesis of C7 on T1, stable. Other neck: No other acute soft tissue abnormality within the neck. Upper chest: Small focal filling defects seen within a left upper lobe segmental pulmonary artery, consistent with acute pulmonary embolism (series 9, image 160). No other visible pulmonary emboli. Moderate layering bilateral pleural effusions with associated atelectasis. Underlying mild pulmonary interstitial edema. Review of the MIP images confirms the above findings CTA HEAD FINDINGS Anterior circulation: Petrous segments widely patent. Calcified atheromatous plaque within the carotid siphons with no more than mild multifocal stenosis. ICA termini well perfused. A1 segments patent bilaterally. Normal anterior communicating artery complex. Anterior cerebral arteries widely patent to their distal aspects. No M1 stenosis or occlusion. Negative MCA bifurcations. Distal MCA branches well perfused and symmetric. Posterior circulation: Dominant left vertebral artery demonstrates scattered atheromatous irregularity but is patent to the vertebrobasilar junction without high-grade  stenosis. Patent left PICA. Hypoplastic right vertebral artery largely terminates in PICA. Right PICA patent as well. Basilar artery diffusely diminutive but patent to its distal aspect, stable. Superior cerebral arteries patent bilaterally. Predominant fetal type origin of the PCAs. Right PCA patent to its distal aspect without stenosis. Short-segment severe proximal left P2 stenosis, unchanged. Venous sinuses: Grossly patent allowing for timing the contrast bolus. Anatomic variants: Predominant fetal type origin of the PCAs with overall diminutive vertebrobasilar system. Review of the MIP images confirms the above findings IMPRESSION: 1. Stable CTA of the head and neck as compared to recent exam from 11/20/2018. No emergent large vessel occlusion. 2. Short-segment severe proximal left P2 stenosis, stable. 3. Predominant fetal type origin of the PCAs with overall diminutive vertebrobasilar system. 4. Focal filling defect within a left upper lobe segmental pulmonary artery, consistent with a small acute PE. 5. Moderate layering bilateral pleural effusions with associated atelectasis and pulmonary interstitial edema. Current attempt is being made to contact the covering clinician regarding these findings. Results will be conveyed as soon as possible. Electronically Signed: By: Jeannine Boga M.D. On: 12/19/2018 20:50   Ct Angio Chest Pe W And/or Wo Contrast  Result Date: 12/18/2018 CLINICAL DATA:  Shortness of breath. EXAM: CT ANGIOGRAPHY CHEST WITH CONTRAST TECHNIQUE: Multidetector CT imaging of the chest was performed using the standard protocol during bolus administration of intravenous contrast. Multiplanar CT image reconstructions and MIPs were obtained to evaluate the vascular anatomy. CONTRAST:  80 mL OMNIPAQUE IOHEXOL 350 MG/ML SOLN COMPARISON:  11/17/2018 and 11/12/2018 FINDINGS: Cardiovascular: Heart is normal size. Small amount of pericardial fluid is present anteriorly. Thoracic aorta  demonstrates no evidence of aneurysm or dissection. There is mild calcified plaque over the thoracic aorta. Pulmonary or system is well opacified demonstrates suggestion of small emboli over the proximal left upper lobar pulmonary artery. There is also suggestion of subtle emboli over a subsegmental right middle lobe pulmonary artery. RV/LV ratio 0.96. Remaining  vascular structures are unremarkable. Mediastinum/Nodes: 1 cm precarinal lymph node is present. 1.3 cm subcarinal lymph node. No significant hilar adenopathy. Remaining mediastinal structures are unremarkable. Lungs/Pleura: Lungs are adequately inflated demonstrate small bilateral pleural effusions with associated posterior atelectatic change. Subtle patchy density over the right upper lobe with mild nodularity as this could represent infection infection. Airways are unremarkable. Upper Abdomen: No acute findings. Mild calcified plaque over the abdominal aorta. Musculoskeletal: Degenerative change of the spine. Review of the MIP images confirms the above findings. IMPRESSION: 1. Findings suggesting subtle emboli over the proximal left upper lobe pulmonary artery and subsegmental right middle lobar pulmonary artery. RV/LV ratio slightly elevated at 0.96 indicating mild right heart strain. 2. Small bilateral pleural effusions with associated atelectasis. Mild patchy post density over the right upper lobe with mild nodularity which may be due to infection. Minimal mediastinal adenopathy likely reactive. Recommend follow-up CT 4-6 weeks. 3.  Aortic Atherosclerosis (ICD10-I70.0). Critical Value/emergent results were called by telephone at the time of interpretation on 12/18/2018 at 2:46 pm to providerBOWIE TRAN , who verbally acknowledged these results. Electronically Signed   By: Marin Olp M.D.   On: 12/18/2018 14:50   Mr Brain Wo Contrast  Result Date: 12/19/2018 CLINICAL DATA:  Altered mental status unclear etiology EXAM: MRI HEAD WITHOUT CONTRAST  TECHNIQUE: Multiplanar, multiecho pulse sequences of the brain and surrounding structures were obtained without intravenous contrast. COMPARISON:  MRI head 11/20/2018 FINDINGS: Brain: Small areas of acute infarct in the thalamus bilaterally have developed since the prior study. Small acute infarct left frontal white matter. Small acute infarct right cerebellum also new. Generalized atrophy with chronic microvascular ischemic change in the white matter. Chronic microhemorrhage left cerebellar tonsil and left thalamus. Negative for mass or midline shift. Vascular: Normal arterial flow voids. Skull and upper cervical spine: No acute skeletal abnormality. C1-2 arthropathy with pannus posterior to the dens. No significant spinal stenosis. Mild anterolisthesis C2-3. Sinuses/Orbits: Paranasal sinuses clear. Right mastoid effusion. No orbital lesion. Other: None IMPRESSION: Compared with the recent MRI of 11/20/2018, new areas of acute infarct are noted in the thalamus bilaterally, left frontal white matter, and right lateral cerebellum. Electronically Signed   By: Franchot Gallo M.D.   On: 12/19/2018 13:08   Mr Thoracic Spine Wo Contrast  Result Date: 12/19/2018 CLINICAL DATA:  Back pain.  Endocarditis. EXAM: MRI THORACIC AND LUMBAR SPINE WITHOUT CONTRAST TECHNIQUE: Multiplanar and multiecho pulse sequences of the thoracic and lumbar spine were obtained without intravenous contrast. COMPARISON:  MRI thoracic and lumbar spine 11/18/2018 FINDINGS: MRI THORACIC SPINE FINDINGS Alignment: Normal alignment. Extension weighted dorsal kyphosis T7-8. Vertebrae: Negative for fracture or mass. Scattered hemangiomata. Discogenic changes in the midthoracic spine Cord: Spinal cord signal normal throughout the thoracic spine. Cord flattening at T7-8 due to disc protrusion Paraspinal and other soft tissues: Small bilateral pleural effusions. No paraspinous mass. Disc levels: T2-3: Broad-based central disc protrusion without  significant stenosis T4-5: Small right-sided disc protrusion T6-7: Moderate disc degeneration. Small left-sided disc protrusion with mild cord flattening on the left T7-8: Moderately large central disc protrusion with cord flattening unchanged from the prior study. T8-9: Small right-sided disc protrusion T9-10: Disc degeneration with right-sided spurring. Mild right foraminal stenosis. T11-12: Small left-sided disc protrusion. The patient was not able to tolerate postcontrast imaging of the thoracic or lumbar spine. MRI LUMBAR SPINE FINDINGS Segmentation:  Normal Alignment: Lumbar dextroscoliosis. Mild retrolisthesis L1-2 and L2-3 Vertebrae: Negative for fracture. New endplate edema at P5-9 with associated fluid signal in  the disc space. Findings are suggestive of discitis osteomyelitis given the history of endocarditis and interval development since 1 month ago. Conus medullaris and cauda equina: Conus extends to the L2 level. Conus and cauda equina appear normal. Paraspinal and other soft tissues: New fluid collection right psoas muscle at the L1-2 level measuring 10 x 15 mm. New 6 mm fluid collection left upper psoas muscle at the L2 level. Probable abscesses given the other findings. Disc levels: T12-L1: Negative L1-2: Fluid in the disc space with new endplate edema. Fluid in the disc space projects into the left epidural fluid collection most compatible with small epidural abscess. This is new. Diffuse endplate spurring with foraminal narrowing bilaterally due to spurring. L2-3: Disc degeneration and spurring. Bilateral facet degeneration. Severe subarticular stenosis on the left and mild spinal stenosis. Mild subarticular stenosis on the right L3-4: Moderate spinal stenosis and moderate subarticular and foraminal stenosis left greater than right. Disc degeneration and spurring with moderate facet hypertrophy bilaterally L4-5: Disc degeneration with diffuse endplate spurring and bilateral facet hypertrophy.  Mild spinal stenosis. Moderate subarticular bilaterally. L5-S1: Disc degeneration with endplate spurring. Bilateral facet hypertrophy. Moderate right foraminal encroachment unchanged. IMPRESSION: MR THORACIC SPINE IMPRESSION Multilevel degenerative change throughout the thoracic spine. No evidence of discitis osteomyelitis or fracture in the thoracic spine. Small bilateral pleural effusions. MR LUMBAR SPINE IMPRESSION Interval development of endplate edema at G9-9. This is most consistent with discitis and osteomyelitis. Small left-sided epidural abscess. Bilateral small psoas abscesses have developed since the prior MRI. Patient was not able to tolerate postcontrast imaging. Electronically Signed   By: Franchot Gallo M.D.   On: 12/19/2018 13:45   Mr Lumbar Spine Wo Contrast  Result Date: 12/19/2018 CLINICAL DATA:  Back pain.  Endocarditis. EXAM: MRI THORACIC AND LUMBAR SPINE WITHOUT CONTRAST TECHNIQUE: Multiplanar and multiecho pulse sequences of the thoracic and lumbar spine were obtained without intravenous contrast. COMPARISON:  MRI thoracic and lumbar spine 11/18/2018 FINDINGS: MRI THORACIC SPINE FINDINGS Alignment: Normal alignment. Extension weighted dorsal kyphosis T7-8. Vertebrae: Negative for fracture or mass. Scattered hemangiomata. Discogenic changes in the midthoracic spine Cord: Spinal cord signal normal throughout the thoracic spine. Cord flattening at T7-8 due to disc protrusion Paraspinal and other soft tissues: Small bilateral pleural effusions. No paraspinous mass. Disc levels: T2-3: Broad-based central disc protrusion without significant stenosis T4-5: Small right-sided disc protrusion T6-7: Moderate disc degeneration. Small left-sided disc protrusion with mild cord flattening on the left T7-8: Moderately large central disc protrusion with cord flattening unchanged from the prior study. T8-9: Small right-sided disc protrusion T9-10: Disc degeneration with right-sided spurring. Mild right  foraminal stenosis. T11-12: Small left-sided disc protrusion. The patient was not able to tolerate postcontrast imaging of the thoracic or lumbar spine. MRI LUMBAR SPINE FINDINGS Segmentation:  Normal Alignment: Lumbar dextroscoliosis. Mild retrolisthesis L1-2 and L2-3 Vertebrae: Negative for fracture. New endplate edema at M4-2 with associated fluid signal in the disc space. Findings are suggestive of discitis osteomyelitis given the history of endocarditis and interval development since 1 month ago. Conus medullaris and cauda equina: Conus extends to the L2 level. Conus and cauda equina appear normal. Paraspinal and other soft tissues: New fluid collection right psoas muscle at the L1-2 level measuring 10 x 15 mm. New 6 mm fluid collection left upper psoas muscle at the L2 level. Probable abscesses given the other findings. Disc levels: T12-L1: Negative L1-2: Fluid in the disc space with new endplate edema. Fluid in the disc space projects into the left epidural  fluid collection most compatible with small epidural abscess. This is new. Diffuse endplate spurring with foraminal narrowing bilaterally due to spurring. L2-3: Disc degeneration and spurring. Bilateral facet degeneration. Severe subarticular stenosis on the left and mild spinal stenosis. Mild subarticular stenosis on the right L3-4: Moderate spinal stenosis and moderate subarticular and foraminal stenosis left greater than right. Disc degeneration and spurring with moderate facet hypertrophy bilaterally L4-5: Disc degeneration with diffuse endplate spurring and bilateral facet hypertrophy. Mild spinal stenosis. Moderate subarticular bilaterally. L5-S1: Disc degeneration with endplate spurring. Bilateral facet hypertrophy. Moderate right foraminal encroachment unchanged. IMPRESSION: MR THORACIC SPINE IMPRESSION Multilevel degenerative change throughout the thoracic spine. No evidence of discitis osteomyelitis or fracture in the thoracic spine. Small  bilateral pleural effusions. MR LUMBAR SPINE IMPRESSION Interval development of endplate edema at W1-1. This is most consistent with discitis and osteomyelitis. Small left-sided epidural abscess. Bilateral small psoas abscesses have developed since the prior MRI. Patient was not able to tolerate postcontrast imaging. Electronically Signed   By: Franchot Gallo M.D.   On: 12/19/2018 13:45   Dg Chest Port 1 View  Result Date: 12/18/2018 CLINICAL DATA:  85 year old female with shortness of breath and cough. EXAM: PORTABLE CHEST 1 VIEW COMPARISON:  Chest CT dated 11/17/2018 and radiograph dated 11/12/2018 FINDINGS: Right-sided PICC with tip at the cavoatrial junction. Left lung base density, likely combination of trace pleural effusion and subsegmental atelectasis/scarring the right lung is clear. There is no pneumothorax. Stable cardiomediastinal silhouette. The aorta is tortuous. There is atherosclerotic calcification of the aorta. No acute osseous pathology. Developing infiltrate is not excluded. Clinical correlation is recommended. IMPRESSION: 1. Right-sided PICC with tip at the cavoatrial junction. No pneumothorax. 2. Left lung base density, likely combination of trace pleural effusion and atelectasis/scarring. Developing infiltrate is not excluded. Electronically Signed   By: Anner Crete M.D.   On: 12/18/2018 11:57   Vas [place] Lower Extremity Venous (dvt)  Result Date: 12/19/2018  Lower Venous Study Indications: Pulmonary embolism.  Comparison Study: no current prior Performing Technologist: Abram Sander RVS  Examination Guidelines: A complete evaluation includes B-mode imaging, spectral Doppler, color Doppler, and power Doppler as needed of all accessible portions of each vessel. Bilateral testing is considered an integral part of a complete examination. Limited examinations for reoccurring indications may be performed as noted.  +---------+---------------+---------+-----------+----------+--------------+   RIGHT     Compressibility Phasicity Spontaneity Properties Thrombus Aging  +---------+---------------+---------+-----------+----------+--------------+  CFV       Full            Yes       Yes                                    +---------+---------------+---------+-----------+----------+--------------+  SFJ       Full                                                             +---------+---------------+---------+-----------+----------+--------------+  FV Prox   Full                                                             +---------+---------------+---------+-----------+----------+--------------+  FV Mid    Full                                                             +---------+---------------+---------+-----------+----------+--------------+  FV Distal Full                                                             +---------+---------------+---------+-----------+----------+--------------+  PFV       Full                                                             +---------+---------------+---------+-----------+----------+--------------+  POP       Full            Yes       Yes                                    +---------+---------------+---------+-----------+----------+--------------+  PTV       Full                                                             +---------+---------------+---------+-----------+----------+--------------+  PERO                                                       Not visualized  +---------+---------------+---------+-----------+----------+--------------+   +---------+---------------+---------+-----------+----------+--------------+  LEFT      Compressibility Phasicity Spontaneity Properties Thrombus Aging  +---------+---------------+---------+-----------+----------+--------------+  CFV       Full            Yes       Yes                                    +---------+---------------+---------+-----------+----------+--------------+  SFJ       Full                                                              +---------+---------------+---------+-----------+----------+--------------+  FV Prox   Full                                                             +---------+---------------+---------+-----------+----------+--------------+  FV Mid    Full                                                             +---------+---------------+---------+-----------+----------+--------------+  FV Distal Full                                                             +---------+---------------+---------+-----------+----------+--------------+  PFV       Full                                                             +---------+---------------+---------+-----------+----------+--------------+  POP       Full            Yes       Yes                                    +---------+---------------+---------+-----------+----------+--------------+  PTV       Full                                                             +---------+---------------+---------+-----------+----------+--------------+  PERO      Full                                                             +---------+---------------+---------+-----------+----------+--------------+     Summary: Right: There is no evidence of deep vein thrombosis in the lower extremity. No cystic structure found in the popliteal fossa. Left: There is no evidence of deep vein thrombosis in the lower extremity. No cystic structure found in the popliteal fossa.  *See table(s) above for measurements and observations. Electronically signed by Monica Martinez MD on 12/19/2018 at 3:32:30 PM.    Final    Impression/Plan   85 y.o. female with fairly extensive recent past medical history including ischemic CVA, bacteremia, endocarditis, HCAP, PE and now with L1-L2 osteomyelitis discitis and small epidural abscess without resultant spinal stenosis and bilateral small psoas abscess. Neuro deficits as above. No role for NS intervention at present.  - Continue to treat  infection per ID recommendations.  - Could consider IR aspiration if necessary - She can be given a lumbar corset to use for sx relief when upright and OOB although appears to be bedbound since CVA several months back.   Ferne Reus, PA-C [facility] [employer]

## 2018-12-19 NOTE — Evaluation (Signed)
Clinical/Bedside Swallow Evaluation Patient Details  Name: Autumn [name] MRN: [id number] Date of Birth: [birth year]  Today's Date: 12/19/2018 Time: SLP Start Time (ACUTE ONLY): 70 SLP Stop Time (ACUTE ONLY): 1530 SLP Time Calculation (min) (ACUTE ONLY): 25 min  Past Medical History:  Past Medical History:  Diagnosis Date  . Anxiety   . GERD (gastroesophageal reflux disease)   . Hypertension   . Hypothyroid   . LBBB (left bundle branch block)   . MRSA bacteremia 11/19/2018  . Osteopenia   . Peripheral neuropathy   . Venous insufficiency    Past Surgical History:  Past Surgical History:  Procedure Laterality Date  . ABDOMINAL HYSTERECTOMY    . BLADDER SURGERY    . LEFT HEART CATHETERIZATION WITH CORONARY ANGIOGRAM N/A 09/02/2013   Procedure: LEFT HEART CATHETERIZATION WITH CORONARY ANGIOGRAM;  Surgeon: Minus Breeding, MD;  Location: [facility] C[facility] LAB;  Service: Cardiovascular;  Laterality: N/A;  . RENAL ANGIOGRAM N/A 09/02/2013   Procedure: RENAL ANGIOGRAM;  Surgeon: Minus Breeding, MD;  Location: [facility] CATH LAB;  Service: Cardiovascular;  Laterality: N/A;  . TEE WITHOUT CARDIOVERSION N/A 11/20/2018   Procedure: TRANSESOPHAGEAL ECHOCARDIOGRAM (TEE);  Surgeon: Dorothy Spark, MD;  Location: [facility] ENDOSCOPY;  Service: Cardiovascular;  Laterality: N/A;   HPI:  85 y.o. female admitted 12/18/2018 with fever and AMS. PMH: GERD, hypertension, chronic systolic congestive heart failure, hypothyroidism, left bundle branch block, peripheral neuropathy. Admitted from 11/12/2018-11/26/2018 for acute ischemic left cerebellar infarct, MSSA bacteremia and posterior mitral valve vegetation consistent with endocarditis by TEE on 11/20/2018.   Assessment / Plan / Recommendation Clinical Impression  Pt presents with adequate dentition and oral motor strength and function. Due to pain, pt was unable to tolerate being positioned higher than 28 degrees upright, however, she did not exhibit any overt s/s aspiration on  any consistency given. Pt and RN also report no difficulty with swallowing. Recommend continuing with regular diet and thin liquids. No further ST intervention recommended at this time. Please reconsult if needs arise.    SLP Visit Diagnosis: Dysphagia, unspecified (R13.10)    Aspiration Risk  Mild aspiration risk    Diet Recommendation Regular;Thin liquid   Liquid Administration via: Straw;Cup Medication Administration: Whole meds with liquid Supervision: Patient able to self feed Compensations: Slow rate;Small sips/bites Postural Changes: Seated upright at 90 degrees    Other  Recommendations Oral Care Recommendations: Oral care BID   Follow up Recommendations None          Prognosis Prognosis for Safe Diet Advancement: Good      Swallow Study   General Date of Onset: 12/18/18 HPI: 85 y.o. female admitted 12/18/2018 with fever and AMS. PMH: GERD, hypertension, chronic systolic congestive heart failure, hypothyroidism, left bundle branch block, peripheral neuropathy. Admitted from 11/12/2018-11/26/2018 for acute ischemic left cerebellar infarct, MSSA bacteremia and posterior mitral valve vegetation consistent with endocarditis by TEE on 11/20/2018. Type of Study: Bedside Swallow Evaluation Previous Swallow Assessment: BSE attempted 11/21/2018 - pt declined Diet Prior to this Study: Regular Temperature Spikes Noted: No Respiratory Status: Room air History of Recent Intubation: No Behavior/Cognition: Alert;Cooperative;Pleasant mood Oral Cavity Assessment: Within Functional Limits Oral Care Completed by SLP: No Oral Cavity - Dentition: Adequate natural dentition Vision: Functional for self-feeding Self-Feeding Abilities: Able to feed self Patient Positioning: Partially reclined;Postural control adequate for testing Baseline Vocal Quality: Low vocal intensity Volitional Cough: Weak Volitional Swallow: Able to elicit    Oral/Motor/Sensory Function Overall Oral Motor/Sensory  Function: Within functional limits   Ice Chips  Ice chips: Not tested   Thin Liquid Thin Liquid: Within functional limits Presentation: Straw    Nectar Thick Nectar Thick Liquid: Not tested   Honey Thick Honey Thick Liquid: Not tested   Puree Puree: Within functional limits Presentation: Spoon   Solid     Solid: Within functional limits Presentation: Self Fed     Harlow Asa, MSP, CCC-SLP Speech Language Pathologist Office: [phone] Pager: [phone]  Leigh Aurora 12/19/2018,3:48 PM

## 2018-12-19 NOTE — Progress Notes (Signed)
Without imaging only obtained on patient.  Patient did not wish to continue due to length of exams.  We were only able to get without imaging. Studies sent through and can be re-ordered as with only if pt decides to return.  Thanks

## 2018-12-19 NOTE — Progress Notes (Signed)
PT Cancellation Note  Patient Details Name: Autumn [name] MRN: [id number] DOB: [birth year]   Cancelled Treatment:    Reason Eval/Treat Not Completed: Patient not medically ready. Pt with PE and started Heparin at 1600 on 12/18/18. Per dept protocol, need to wait til 24 hours post initiation of Heparin. Will check back as schedule permits.   SHIRLE PROVENCAL 12/19/2018, 2:32 PM

## 2018-12-19 NOTE — Progress Notes (Signed)
[place] for heparin Indication: pulmonary embolus  Allergies  Allergen Reactions  . Chlorhexidine   . Hydrochlorothiazide Other (See Comments)    REACTION: hyponatremia  . Ramipril Other (See Comments)    REACTION: GI upset  . Remeron [Mirtazapine] Other (See Comments)    REACTION: weakness  . Sulfa Antibiotics Rash    Patient Measurements: Height: 5\' 3"  (160 cm) Weight: 166 lb 3.6 oz (75.4 kg)(per last visit) IBW/kg (Calculated) : 52.4 Heparin Dosing Weight: 68.5 kg  Vital Signs: Temp: 98.6 F (37 C) (11/05 1444) Temp Source: Oral (11/05 1444) BP: 138/58 (11/05 1003) Pulse Rate: 79 (11/05 1444)  Labs: Recent Labs    12/18/18 1056 12/18/18 1215 12/18/18 1251 12/18/18 1627 12/19/18 0121 12/19/18 0500 12/19/18 0624 12/19/18 1432  HGB 7.9*  --  7.5*  --   --   --  10.0*  --   HCT 24.9*  --  22.0*  --   --   --  30.7*  --   PLT 261  --   --   --   --   --  282  --   APTT 41*  --   --   --   --   --   --   --   LABPROT 15.1  --   --   --   --   --   --   --   INR 1.2  --   --   --   --   --   --   --   HEPARINUNFRC  --   --   --   --  0.96* 0.88*  --  0.69  CREATININE 0.85  --   --   --   --   --  0.75  --   TROPONINIHS  --  8  --  7  --   --   --   --     Estimated Creatinine Clearance: 50.9 mL/min (by C-G formula based on SCr of 0.75 mg/dL).  Assessment: 85 y.o. female with PE for heparin. Heparin level therapeutic at 0.69. No bleeding noted.   Goal of Therapy:  Heparin level 0.3-0.7 units/ml Monitor platelets by anticoagulation protocol: Yes   Plan:  Continue Heparin at 950 units/hr Check heparin level in 8 hours.    Jordana Dugue A. Levada Dy, PharmD, BCPS, FNKF Clinical Pharmacist [place] Please utilize Amion for appropriate phone number to reach the unit pharmacist ([place] [place])    12/19/2018,3:10 PM

## 2018-12-19 NOTE — ED Notes (Signed)
Patient placed in hospital bed, awake alert oriented.

## 2018-12-19 NOTE — Progress Notes (Signed)
Lower extremity venous has been completed.   Preliminary results in CV Proc.   Abram Sander 12/19/2018 10:52 AM

## 2018-12-19 NOTE — Progress Notes (Signed)
Patient ID: Autumn [name], female   DOB: [birth year], 85 y.o.   MRN: [id number]  PROGRESS NOTE    Autumn [name]  QIO:[id number] DOB: 11/16/1932 DOA: 12/18/2018 PCP: Merlene Laughter, MD   Brief Narrative:  85 year old female with history of hypertension, chronic systolic CHF, hypothyroidism, left bundle branch block, peripheral neuropathy, admission from 11/12/2018-11/26/2018 during which she was treated for acute ischemic left cerebellar CVA, MSSA bacteremia with signs of posterior mitral valve vegetation consistent with endocarditis with TEE on 11/20/2018 as well as acute and chronic CHF with EF decrease down to 25% during which time patient had declined aggressive work-up by cardiology and gastroenterology and she was treated with Rocephin with plan to complete intravenous Rocephin till 12/31/2018 for endocarditis as per ID recommendations and was discharged to rehab facility with indwelling Foley catheter.  Since the stroke, patient has basically been bedbound.  She presented on 12/18/2018 with altered mental status from rehab facility and was found to be febrile.  She was admitted for sepsis secondary to healthcare associated pneumonia and started on broad-spectrum antibiotics.  She was also found to have pulmonary emboli and started on heparin drip.  Assessment & Plan:   Sepsis: Present on admission Probable healthcare associated pneumonia with concern for gram-negative rods versus MRSA pneumonia History of recent MSSA bacteremia and probable mitral valve endocarditis -Patient was on Rocephin prior to presentation till 12/31/2018 as per ID recommendations.  PICC line was discontinued on presentation. -Admitting provider discussed case with Dr. Daiva Eves from ID on presentation and patient was started on empiric vancomycin and cefepime -SLP evaluation. -Hemodynamically improving. -Follow cultures.  COVID-19 testing negative.  Acute hypoxic respiratory failure -Required supplemental oxygen on  presentation.  Currently on room air.  Incentive spirometry.  Pulmonary emboli -CT angiogram of the chest revealed subtle pulmonary emboli of the right and left lungs with mild right heart strain. -Continue Heparin per pharmacy.  Will probably switch to oral Eliquis tomorrow. -We will get repeat 2D echo. -Lower extremity duplex ultrasound to rule out DVT.  Acute metabolic encephalopathy -Most likely from all of the above.  Patient has history of stroke recently.  Will repeat MRI of the brain.  Combined systolic and diastolic heart failure -Currently euvolemic.  Last EF was noted to be around 25%.  Repeat 2D echo.  Strict input and output.  Daily weights.  Might need to start Lasix.  Fluid restriction.  Back pain -Patient son is concerned that patient has been bedbound since her last hospitalization with increasing back pain.  She had MRI of the thoracic/lumbar spine and sacrum during last hospitalization to evaluate for discitis/osteomyelitis which were negative.  Because of worsening pain, will request repeat MRI of the thoracolumbar spine.  Anemia of chronic disease -Probably from above.  Status post 1 unit packed red cell transfusion on 12/18/2018.  H&H stable.  Generalized deconditioning -PT/OT eval.  Overall prognosis is guarded to poor.  Request palliative care consultation for goals of care discussion.    DVT prophylaxis: Heparin Code Status: DNR Family Communication: Spoke to son/Kurt at bedside Disposition Plan: Depends on clinical outcome  Consultants: Admitting provider spoke to ID/Dr. Daiva Eves on phone  Procedures: None  Antimicrobials:  Vancomycin and cefepime from 12/18/2018 onwards  Subjective: Patient seen and examined at bedside.  She is a poor historian.  Feels very weak.  Complains of back pain.  No overnight fever or vomiting.  No worsening shortness of breath.  Objective: Vitals:   12/19/18 0115 12/19/18 0330 12/19/18  0400 12/19/18 1003  BP: (!) 120/50   116/80 (!) 138/58  Pulse: 71 73 80 (!) 109  Resp: 19 18 20 16   Temp:    99.1 F (37.3 C)  TempSrc:    Axillary  SpO2: 96% 95% 96% 96%  Weight:      Height:        Intake/Output Summary (Last 24 hours) at 12/19/2018 1058 Last data filed at 12/19/2018 1015 Gross per 24 hour  Intake 1055 ml  Output 600 ml  Net 455 ml   Filed Weights   12/18/18 1100  Weight: 75.4 kg    Examination:  General exam: Appears calm and comfortable.  Elderly female lying in bed.  Poor historian. Respiratory system: Bilateral decreased breath sounds at bases with some scattered crackles Cardiovascular system: S1 & S2 heard, Rate controlled Gastrointestinal system: Abdomen is nondistended, soft and nontender. Normal bowel sounds heard. Extremities: No cyanosis, clubbing; trace lower extremity edema Central nervous system: Alert and oriented.  Very slow to respond to questions.  No focal neurological deficits. Moving extremities Skin: Diffuse nonblanching erythematous rash over the abdomen and lower extremities. Psychiatry: Could not be assessed because of mental status. Back: Mildly tender in the lower back.  No ulcerations.    Data Reviewed: I have personally reviewed following labs and imaging studies  CBC: Recent Labs  Lab 12/18/18 1056 12/18/18 1251 12/19/18 0624  WBC 8.7  --  11.6*  NEUTROABS 5.6  --   --   HGB 7.9* 7.5* 10.0*  HCT 24.9* 22.0* 30.7*  MCV 105.1*  --  96.2  PLT 261  --  282   Basic Metabolic Panel: Recent Labs  Lab 12/18/18 1056 12/18/18 1251 12/19/18 0624  NA 133* 133* 132*  K 4.3 4.5 3.9  CL 102  --  100  CO2 22  --  22  GLUCOSE 151*  --  98  BUN 9  --  10  CREATININE 0.85  --  0.75  CALCIUM 8.0*  --  8.2*   GFR: Estimated Creatinine Clearance: 50.9 mL/min (by C-G formula based on SCr of 0.75 mg/dL). Liver Function Tests: Recent Labs  Lab 12/18/18 1056  AST 30  ALT 19  ALKPHOS 105  BILITOT 0.1*  PROT 4.6*  ALBUMIN 1.6*   No results for input(s):  LIPASE, AMYLASE in the last 168 hours. No results for input(s): AMMONIA in the last 168 hours. Coagulation Profile: Recent Labs  Lab 12/18/18 1056  INR 1.2   Cardiac Enzymes: No results for input(s): CKTOTAL, CKMB, CKMBINDEX, TROPONINI in the last 168 hours. BNP (last 3 results) No results for input(s): PROBNP in the last 8760 hours. HbA1C: No results for input(s): HGBA1C in the last 72 hours. CBG: No results for input(s): GLUCAP in the last 168 hours. Lipid Profile: No results for input(s): CHOL, HDL, LDLCALC, TRIG, CHOLHDL, LDLDIRECT in the last 72 hours. Thyroid Function Tests: No results for input(s): TSH, T4TOTAL, FREET4, T3FREE, THYROIDAB in the last 72 hours. Anemia Panel: No results for input(s): VITAMINB12, FOLATE, FERRITIN, TIBC, IRON, RETICCTPCT in the last 72 hours. Sepsis Labs: Recent Labs  Lab 12/18/18 1056 12/18/18 1244  PROCALCITON 0.13  --   LATICACIDVEN 2.0* 1.4    Recent Results (from the past 240 hour(s))  Blood Culture (routine x 2)     Status: None (Preliminary result)   Collection Time: 12/18/18 10:47 AM   Specimen: BLOOD RIGHT ARM  Result Value Ref Range Status   Specimen Description BLOOD RIGHT ARM  Final   Special Requests   Final    BOTTLES DRAWN AEROBIC AND ANAEROBIC Blood Culture adequate volume   Culture   Final    NO GROWTH < 12 HOURS Performed at [facility], [address] [place], [place] [address]    Report Status PENDING  Incomplete  SARS Coronavirus 2 by RT PCR (hospital order, performed in [facility] hospital lab) Nasopharyngeal Nasopharyngeal Swab     Status: None   Collection Time: 12/18/18 12:13 PM   Specimen: Nasopharyngeal Swab  Result Value Ref Range Status   SARS Coronavirus 2 NEGATIVE NEGATIVE Final    Comment: (NOTE) If result is NEGATIVE SARS-CoV-2 target nucleic acids are NOT DETECTED. The SARS-CoV-2 RNA is generally detectable in upper and lower  respiratory specimens during the acute phase of infection.  The lowest  concentration of SARS-CoV-2 viral copies this assay can detect is 250  copies / mL. A negative result does not preclude SARS-CoV-2 infection  and should not be used as the sole basis for treatment or other  patient management decisions.  A negative result may occur with  improper specimen collection / handling, submission of specimen other  than nasopharyngeal swab, presence of viral mutation(s) within the  areas targeted by this assay, and inadequate number of viral copies  (<250 copies / mL). A negative result must be combined with clinical  observations, patient history, and epidemiological information. If result is POSITIVE SARS-CoV-2 target nucleic acids are DETECTED. The SARS-CoV-2 RNA is generally detectable in upper and lower  respiratory specimens dur ing the acute phase of infection.  Positive  results are indicative of active infection with SARS-CoV-2.  Clinical  correlation with patient history and other diagnostic information is  necessary to determine patient infection status.  Positive results do  not rule out bacterial infection or co-infection with other viruses. If result is PRESUMPTIVE POSTIVE SARS-CoV-2 nucleic acids MAY BE PRESENT.   A presumptive positive result was obtained on the submitted specimen  and confirmed on repeat testing.  While 2019 novel coronavirus  (SARS-CoV-2) nucleic acids may be present in the submitted sample  additional confirmatory testing may be necessary for epidemiological  and / or clinical management purposes  to differentiate between  SARS-CoV-2 and other Sarbecovirus currently known to infect humans.  If clinically indicated additional testing with an alternate test  methodology [phone]) is advised. The SARS-CoV-2 RNA is generally  detectable in upper and lower respiratory sp ecimens during the acute  phase of infection. The expected result is Negative. Fact Sheet for Patients:   StrictlyIdeas.no Fact Sheet for Healthcare Providers: BankingDealers.co.za This test is not yet approved or cleared by the [place] FDA and has been authorized for detection and/or diagnosis of SARS-CoV-2 by FDA under an Emergency Use Authorization (EUA).  This EUA will remain in effect (meaning this test can be used) for the duration of the COVID-19 declaration under Section 564(b)(1) of the Act, 21 U.S.C. section 360bbb-3(b)(1), unless the authorization is terminated or revoked sooner. Performed at [facility], [place] [address], [place],  [address]   Blood Culture (routine x 2)     Status: None (Preliminary result)   Collection Time: 12/18/18 12:31 PM   Specimen: BLOOD RIGHT HAND  Result Value Ref Range Status   Specimen Description BLOOD RIGHT HAND  Final   Special Requests   Final    BOTTLES DRAWN AEROBIC ONLY Blood Culture results may not be optimal due to an inadequate volume of  blood received in culture bottles   Culture   Final    NO GROWTH < 12 HOURS Performed at [facility], [address] [place], [place] [address]    Report Status PENDING  Incomplete         Radiology Studies: Ct Angio Chest Pe W And/or Wo Contrast  Result Date: 12/18/2018 CLINICAL DATA:  Shortness of breath. EXAM: CT ANGIOGRAPHY CHEST WITH CONTRAST TECHNIQUE: Multidetector CT imaging of the chest was performed using the standard protocol during bolus administration of intravenous contrast. Multiplanar CT image reconstructions and MIPs were obtained to evaluate the vascular anatomy. CONTRAST:  80 mL OMNIPAQUE IOHEXOL 350 MG/ML SOLN COMPARISON:  11/17/2018 and 11/12/2018 FINDINGS: Cardiovascular: Heart is normal size. Small amount of pericardial fluid is present anteriorly. Thoracic aorta demonstrates no evidence of aneurysm or dissection. There is mild calcified plaque over the thoracic aorta. Pulmonary or system is well opacified  demonstrates suggestion of small emboli over the proximal left upper lobar pulmonary artery. There is also suggestion of subtle emboli over a subsegmental right middle lobe pulmonary artery. RV/LV ratio 0.96. Remaining vascular structures are unremarkable. Mediastinum/Nodes: 1 cm precarinal lymph node is present. 1.3 cm subcarinal lymph node. No significant hilar adenopathy. Remaining mediastinal structures are unremarkable. Lungs/Pleura: Lungs are adequately inflated demonstrate small bilateral pleural effusions with associated posterior atelectatic change. Subtle patchy density over the right upper lobe with mild nodularity as this could represent infection infection. Airways are unremarkable. Upper Abdomen: No acute findings. Mild calcified plaque over the abdominal aorta. Musculoskeletal: Degenerative change of the spine. Review of the MIP images confirms the above findings. IMPRESSION: 1. Findings suggesting subtle emboli over the proximal left upper lobe pulmonary artery and subsegmental right middle lobar pulmonary artery. RV/LV ratio slightly elevated at 0.96 indicating mild right heart strain. 2. Small bilateral pleural effusions with associated atelectasis. Mild patchy post density over the right upper lobe with mild nodularity which may be due to infection. Minimal mediastinal adenopathy likely reactive. Recommend follow-up CT 4-6 weeks. 3.  Aortic Atherosclerosis (ICD10-I70.0). Critical Value/emergent results were called by telephone at the time of interpretation on 12/18/2018 at 2:46 pm to providerBOWIE TRAN , who verbally acknowledged these results. Electronically Signed   By: Elberta Fortis M.D.   On: 12/18/2018 14:50   Dg Chest Port 1 View  Result Date: 12/18/2018 CLINICAL DATA:  85 year old female with shortness of breath and cough. EXAM: PORTABLE CHEST 1 VIEW COMPARISON:  Chest CT dated 11/17/2018 and radiograph dated 11/12/2018 FINDINGS: Right-sided PICC with tip at the cavoatrial junction. Left  lung base density, likely combination of trace pleural effusion and subsegmental atelectasis/scarring the right lung is clear. There is no pneumothorax. Stable cardiomediastinal silhouette. The aorta is tortuous. There is atherosclerotic calcification of the aorta. No acute osseous pathology. Developing infiltrate is not excluded. Clinical correlation is recommended. IMPRESSION: 1. Right-sided PICC with tip at the cavoatrial junction. No pneumothorax. 2. Left lung base density, likely combination of trace pleural effusion and atelectasis/scarring. Developing infiltrate is not excluded. Electronically Signed   By: Elgie Collard M.D.   On: 12/18/2018 11:57   Vas [place] Lower Extremity Venous (dvt)  Result Date: 12/19/2018  Lower Venous Study Indications: Pulmonary embolism.  Comparison Study: no current prior Performing Technologist: Blanch Media RVS  Examination Guidelines: A complete evaluation includes B-mode imaging, spectral Doppler, color Doppler, and power Doppler as needed of all accessible portions of each vessel. Bilateral testing is considered an integral part of a complete examination. Limited examinations  for reoccurring indications may be performed as noted.  +---------+---------------+---------+-----------+----------+--------------+  RIGHT     Compressibility Phasicity Spontaneity Properties Thrombus Aging  +---------+---------------+---------+-----------+----------+--------------+  CFV       Full            Yes       Yes                                    +---------+---------------+---------+-----------+----------+--------------+  SFJ       Full                                                             +---------+---------------+---------+-----------+----------+--------------+  FV Prox   Full                                                             +---------+---------------+---------+-----------+----------+--------------+  FV Mid    Full                                                              +---------+---------------+---------+-----------+----------+--------------+  FV Distal Full                                                             +---------+---------------+---------+-----------+----------+--------------+  PFV       Full                                                             +---------+---------------+---------+-----------+----------+--------------+  POP       Full            Yes       Yes                                    +---------+---------------+---------+-----------+----------+--------------+  PTV       Full                                                             +---------+---------------+---------+-----------+----------+--------------+  PERO  Not visualized  +---------+---------------+---------+-----------+----------+--------------+   +---------+---------------+---------+-----------+----------+--------------+  LEFT      Compressibility Phasicity Spontaneity Properties Thrombus Aging  +---------+---------------+---------+-----------+----------+--------------+  CFV       Full            Yes       Yes                                    +---------+---------------+---------+-----------+----------+--------------+  SFJ       Full                                                             +---------+---------------+---------+-----------+----------+--------------+  FV Prox   Full                                                             +---------+---------------+---------+-----------+----------+--------------+  FV Mid    Full                                                             +---------+---------------+---------+-----------+----------+--------------+  FV Distal Full                                                             +---------+---------------+---------+-----------+----------+--------------+  PFV       Full                                                              +---------+---------------+---------+-----------+----------+--------------+  POP       Full            Yes       Yes                                    +---------+---------------+---------+-----------+----------+--------------+  PTV       Full                                                             +---------+---------------+---------+-----------+----------+--------------+  PERO      Full                                                             +---------+---------------+---------+-----------+----------+--------------+  Summary: Right: There is no evidence of deep vein thrombosis in the lower extremity. No cystic structure found in the popliteal fossa. Left: There is no evidence of deep vein thrombosis in the lower extremity. No cystic structure found in the popliteal fossa.  *See table(s) above for measurements and observations.    Preliminary         Scheduled Meds:  sodium chloride flush  3 mL Intravenous Q12H   Continuous Infusions:  ceFEPime (MAXIPIME) IV Stopped (12/19/18 0300)   heparin 950 Units/hr (12/19/18 1015)   vancomycin            Glade Lloyd, MD [facility] 12/19/2018, 10:58 AM

## 2018-12-19 NOTE — Progress Notes (Signed)
  Echocardiogram 2D Echocardiogram has been performed.  Autumn [name] 12/19/2018, 2:43 PM

## 2018-12-19 NOTE — Progress Notes (Signed)
Pink/red, mottled appearance of skin became more pronounced and hot after vanc was hung 15 min ago.  Dr. Starla Link was called to bedside and said to just slow the vanc, vanc was slowed to 116ml/ hr.    Ankles are swollen and hot and left arm from mid forearm to bicep area are very swollen and hot and bright pink.  Could have been the place for previous picc line.  Dr. Starla Link is aware.

## 2018-12-19 NOTE — ED Notes (Signed)
Pt's urine collection bag started to leak on floor. Blood admin green sheet fell on floor and into urine. Green sheet placed into biohazard bag.

## 2018-12-19 NOTE — Progress Notes (Signed)
Autumn [name] [id number] Admission Data: 12/19/2018 3:39 PM Attending Provider: Aline August, MD  DXI:[id number], Christiane Ha, MD Consults/ Treatment Team:   Autumn [name] is a 85 y.o. female patient admitted from ED awake, alert  & orientated  X 3,  DNR, VSS - Blood pressure (!) 138/58, pulse 79, temperature 98.6 F (37 C), temperature source Oral, resp. rate 20, height 5\' 3"  (1.6 m), weight 75.4 kg, SpO2 96 %., , no c/o shortness of breath, no c/o chest pain, no distress noted. Tele placed and pt is currently running:NSR.   IV site WDL: right forearm x 2 with a transparent dsg that's clean dry and intact.  Allergies:   Allergies  Allergen Reactions  . Chlorhexidine   . Hydrochlorothiazide Other (See Comments)    REACTION: hyponatremia  . Ramipril Other (See Comments)    REACTION: GI upset  . Remeron [Mirtazapine] Other (See Comments)    REACTION: weakness  . Sulfa Antibiotics Rash     Past Medical History:  Diagnosis Date  . Anxiety   . GERD (gastroesophageal reflux disease)   . Hypertension   . Hypothyroid   . LBBB (left bundle branch block)   . MRSA bacteremia 11/19/2018  . Osteopenia   . Peripheral neuropathy   . Venous insufficiency     History:  obtained from chart Tobacco/alcohol: denied   Pt orientation to unit, room and routine. Information packet given to patient/family and safety video watched.  Admission INP armband ID verified with patient/family, and in place. SR up x 2, fall risk assessment complete with Patient and family verbalizing understanding of risks associated with falls. Pt verbalizes an understanding of how to use the call bell and to call for help before getting out of bed.  Skin, clean-dry- intact without evidence of bruising, or skin tears.   No evidence of skin break down noted on exam. Skin as charted, pressure wound on right heel.     Will cont to monitor and assist as needed.  Hassan Rowan, RN 12/19/2018 3:39 PM

## 2018-12-19 NOTE — ED Notes (Signed)
ED TO INPATIENT HANDOFF REPORT  ED Nurse Name and Phone #:   S Name/Age/Gender Autumn [name] 85 y.o. female Room/Bed: 042C/042C  Code Status   Code Status: DNR  Home/SNF/Other Nursing Home Patient oriented to: self, place, time and situation Is this baseline? Yes   Triage Complete: Triage complete  Chief Complaint poss sepsis  Triage Note From [facility] nursing home has  Had a stroke with deficits  , jhas indewelling foley and pic line ( for endocarditis antibiotics) , per [facility] pt had swelling of left arm and had [place] yesterday  Of that arm with no findings of an issue and also had CXRY yesterday with no findings pt has temp of 102.4    Allergies Allergies  Allergen Reactions  . Chlorhexidine   . Hydrochlorothiazide Other (See Comments)    REACTION: hyponatremia  . Ramipril Other (See Comments)    REACTION: GI upset  . Remeron [Mirtazapine] Other (See Comments)    REACTION: weakness  . Sulfa Antibiotics Rash    Level of Care/Admitting Diagnosis ED Disposition    ED Disposition Condition Comment   Admit  Hospital Area: [facility] [[id number]]  Level of Care: Progressive [102]  Admit to Progressive based on following criteria: MULTISYSTEM THREATS such as stable sepsis, metabolic/electrolyte imbalance with or without encephalopathy that is responding to early treatment.  Covid Evaluation: Confirmed COVID Negative  Diagnosis: Sepsis [facility]) [[id number]]  Admitting Physician: Clydie Braun [[id number]]  Attending Physician: Clydie Braun [[id number]]  Estimated length of stay: past midnight tomorrow  Certification:: I certify this patient will need inpatient services for at least 2 midnights  PT Class (Do Not Modify): Inpatient [101]  PT Acc Code (Do Not Modify): Private [1]       B Medical/Surgery History Past Medical History:  Diagnosis Date  . Anxiety   . GERD (gastroesophageal reflux disease)   . Hypertension   . Hypothyroid   . LBBB (left bundle  branch block)   . MRSA bacteremia 11/19/2018  . Osteopenia   . Peripheral neuropathy   . Venous insufficiency    Past Surgical History:  Procedure Laterality Date  . ABDOMINAL HYSTERECTOMY    . BLADDER SURGERY    . LEFT HEART CATHETERIZATION WITH CORONARY ANGIOGRAM N/A 09/02/2013   Procedure: LEFT HEART CATHETERIZATION WITH CORONARY ANGIOGRAM;  Surgeon: [name], MD;  Location: [facility] CATH LAB;  Service: Cardiovascular;  Laterality: N/A;  . RENAL ANGIOGRAM N/A 09/02/2013   Procedure: RENAL ANGIOGRAM;  Surgeon: [name], MD;  Location: [facility] CATH LAB;  Service: Cardiovascular;  Laterality: N/A;  . TEE WITHOUT CARDIOVERSION N/A 11/20/2018   Procedure: TRANSESOPHAGEAL ECHOCARDIOGRAM (TEE);  Surgeon: Lars Masson, MD;  Location: [facility] ENDOSCOPY;  Service: Cardiovascular;  Laterality: N/A;     A IV Location/Drains/Wounds Patient Lines/Drains/Airways Status   Active Line/Drains/Airways    Name:   Placement date:   Placement time:   Site:   Days:   Peripheral IV 12/18/18 Right Hand   12/18/18    1230    Hand   1   Peripheral IV 12/19/18 Right Forearm   12/19/18    0228    Forearm   less than 1   Urethral Catheter Miguel Dibble, RN 16 Fr.   11/27/18    1320    -   22          Intake/Output Last 24 hours  Intake/Output Summary (Last 24 hours) at 12/19/2018 4496 Last data filed at 12/18/2018 2001 Gross per  24 hour  Intake 815 ml  Output -  Net 815 ml    Labs/Imaging Results for orders placed or performed during the hospital encounter of 12/18/18 (from the past 48 hour(s))  Blood Culture (routine x 2)     Status: None (Preliminary result)   Collection Time: 12/18/18 10:47 AM   Specimen: BLOOD RIGHT ARM  Result Value Ref Range   Specimen Description BLOOD RIGHT ARM    Special Requests      BOTTLES DRAWN AEROBIC AND ANAEROBIC Blood Culture adequate volume   Culture      NO GROWTH < 12 HOURS Performed at [facility], [address] [place], [place] [address]     Report Status PENDING   Lactic acid, plasma     Status: Abnormal   Collection Time: 12/18/18 10:56 AM  Result Value Ref Range   Lactic Acid, Venous 2.0 (HH) 0.5 - 1.9 mmol/L    Comment: CRITICAL RESULT CALLED TO, READ BACK BY AND VERIFIED WITH: [facility],C RN @ 1133 12/18/18 LEONARD,A Performed at [facility], [address] [place], [place] [address]   Comprehensive metabolic panel     Status: Abnormal   Collection Time: 12/18/18 10:56 AM  Result Value Ref Range   Sodium 133 (L) 135 - 145 mmol/L   Potassium 4.3 3.5 - 5.1 mmol/L   Chloride 102 98 - 111 mmol/L   CO2 22 22 - 32 mmol/L   Glucose, Bld 151 (H) 70 - 99 mg/dL   BUN 9 8 - 23 mg/dL   Creatinine, Ser 0.980.85 0.44 - 1.00 mg/dL   Calcium 8.0 (L) 8.9 - 10.3 mg/dL   Total Protein 4.6 (L) 6.5 - 8.1 g/dL   Albumin 1.6 (L) 3.5 - 5.0 g/dL   AST 30 15 - 41 U/L   ALT 19 0 - 44 U/L   Alkaline Phosphatase 105 38 - 126 U/L   Total Bilirubin 0.1 (L) 0.3 - 1.2 mg/dL   GFR calc non Af Amer >60 >60 mL/min   GFR calc Af Amer >60 >60 mL/min   Anion gap 9 5 - 15    Comment: Performed at [facility], [address] [place], [place] [address]  CBC WITH DIFFERENTIAL     Status: Abnormal   Collection Time: 12/18/18 10:56 AM  Result Value Ref Range   WBC 8.7 4.0 - 10.5 K/uL   RBC 2.37 (L) 3.87 - 5.11 MIL/uL   Hemoglobin 7.9 (L) 12.0 - 15.0 g/dL   HCT 78.224.9 (L) 95.636.0 - 21.346.0 %   MCV 105.1 (H) 80.0 - 100.0 fL   MCH 33.3 26.0 - 34.0 pg   MCHC 31.7 30.0 - 36.0 g/dL   RDW 08.615.4 57.811.5 - 46.915.5 %   Platelets 261 150 - 400 K/uL   nRBC 0.0 0.0 - 0.2 %   Neutrophils Relative % 65 %   Neutro Abs 5.6 1.7 - 7.7 K/uL   Lymphocytes Relative 14 %   Lymphs Abs 1.2 0.7 - 4.0 K/uL   Monocytes Relative 5 %   Monocytes Absolute 0.5 0.1 - 1.0 K/uL   Eosinophils Relative 15 %   Eosinophils Absolute 1.3 (H) 0.0 - 0.5 K/uL   Basophils Relative 0 %   Basophils Absolute 0.0 0.0 - 0.1 K/uL   Immature Granulocytes 1 %   Abs Immature Granulocytes 0.05  0.00 - 0.07 K/uL    Comment: Performed at [facility], [address] [place], [place] [address]  APTT     Status: Abnormal   Collection Time: 12/18/18 10:56 AM  Result Value Ref Range   aPTT 41 (H) 24 - 36 seconds    Comment:        IF BASELINE aPTT IS ELEVATED, SUGGEST PATIENT RISK ASSESSMENT BE USED TO DETERMINE APPROPRIATE ANTICOAGULANT THERAPY. Performed at [facility], [address] [place], [place] [address]   Protime-INR     Status: None   Collection Time: 12/18/18 10:56 AM  Result Value Ref Range   Prothrombin Time 15.1 11.4 - 15.2 seconds   INR 1.2 0.8 - 1.2    Comment: (NOTE) INR goal varies based on device and disease states. Performed at [facility], [address] [place], [place] [address]   Procalcitonin     Status: None   Collection Time: 12/18/18 10:56 AM  Result Value Ref Range   Procalcitonin 0.13 ng/mL    Comment:        Interpretation: PCT (Procalcitonin) <= 0.5 ng/mL: Systemic infection (sepsis) is not likely. Local bacterial infection is possible. (NOTE)       Sepsis PCT Algorithm           Lower Respiratory Tract                                      Infection PCT Algorithm    ----------------------------     ----------------------------         PCT < 0.25 ng/mL                PCT < 0.10 ng/mL         Strongly encourage             Strongly discourage   discontinuation of antibiotics    initiation of antibiotics    ----------------------------     -----------------------------       PCT 0.25 - 0.50 ng/mL            PCT 0.10 - 0.25 ng/mL               OR       >80% decrease in PCT            Discourage initiation of                                            antibiotics      Encourage discontinuation           of antibiotics    ----------------------------     -----------------------------         PCT >= 0.50 ng/mL              PCT 0.26 - 0.50 ng/mL               AND        <80% decrease in PCT             Encourage  initiation of                                             antibiotics       Encourage continuation  of antibiotics    ----------------------------     -----------------------------        PCT >= 0.50 ng/mL                  PCT > 0.50 ng/mL               AND         increase in PCT                  Strongly encourage                                      initiation of antibiotics    Strongly encourage escalation           of antibiotics                                     -----------------------------                                           PCT <= 0.25 ng/mL                                                 OR                                        > 80% decrease in PCT                                     Discontinue / Do not initiate                                             antibiotics Performed at [facility], [address] [place], [place] [address]   D-dimer, quantitative (not at [facility])     Status: Abnormal   Collection Time: 12/18/18 10:56 AM  Result Value Ref Range   D-Dimer, Quant 3.17 (H) 0.00 - 0.50 ug/mL-FEU    Comment: (NOTE) At the manufacturer cut-off of 0.50 ug/mL FEU, this assay has been documented to exclude PE with a sensitivity and negative predictive value of 97 to 99%.  At this time, this assay has not been approved by the FDA to exclude DVT/VTE. Results should be correlated with clinical presentation. Performed at [facility], [address], [place], [place] [address]   SARS Coronavirus 2 by RT PCR (hospital order, performed in [facility] hospital lab) Nasopharyngeal Nasopharyngeal Swab     Status: None   Collection Time: 12/18/18 12:13 PM   Specimen: Nasopharyngeal Swab  Result Value Ref Range   SARS Coronavirus 2 NEGATIVE NEGATIVE    Comment: (NOTE) If result is NEGATIVE SARS-CoV-2 target nucleic acids are NOT DETECTED. The SARS-CoV-2 RNA is generally detectable in upper and lower  respiratory specimens during the acute phase  of infection. The lowest  concentration of SARS-CoV-2 viral copies this assay can detect is 250  copies / mL. A negative result does not preclude SARS-CoV-2 infection  and should not be used as the sole basis for treatment or other  patient management decisions.  A negative result may occur with  improper specimen collection / handling, submission of specimen other  than nasopharyngeal swab, presence of viral mutation(s) within the  areas targeted by this assay, and inadequate number of viral copies  (<250 copies / mL). A negative result must be combined with clinical  observations, patient history, and epidemiological information. If result is POSITIVE SARS-CoV-2 target nucleic acids are DETECTED. The SARS-CoV-2 RNA is generally detectable in upper and lower  respiratory specimens dur ing the acute phase of infection.  Positive  results are indicative of active infection with SARS-CoV-2.  Clinical  correlation with patient history and other diagnostic information is  necessary to determine patient infection status.  Positive results do  not rule out bacterial infection or co-infection with other viruses. If result is PRESUMPTIVE POSTIVE SARS-CoV-2 nucleic acids MAY BE PRESENT.   A presumptive positive result was obtained on the submitted specimen  and confirmed on repeat testing.  While 2019 novel coronavirus  (SARS-CoV-2) nucleic acids may be present in the submitted sample  additional confirmatory testing may be necessary for epidemiological  and / or clinical management purposes  to differentiate between  SARS-CoV-2 and other Sarbecovirus currently known to infect humans.  If clinically indicated additional testing with an alternate test  methodology [id number]) is advised. The SARS-CoV-2 RNA is generally  detectable in upper and lower respiratory sp ecimens during the acute  phase of infection. The expected result is Negative. Fact Sheet for Patients:   BoilerBrush.com.cyhttps://www.fda.gov/media/136312/download Fact Sheet for Healthcare Providers: https://pope.com/https://www.fda.gov/media/136313/download This test is not yet approved or cleared by the [place] FDA and has been authorized for detection and/or diagnosis of SARS-CoV-2 by FDA under an Emergency Use Authorization (EUA).  This EUA will remain in effect (meaning this test can be used) for the duration of the COVID-19 declaration under Section 564(b)(1) of the Act, 21 U.S.C. section 360bbb-3(b)(1), unless the authorization is terminated or revoked sooner. Performed at [facility], [address] [place], [place] [address]   Troponin I (High Sensitivity)     Status: None   Collection Time: 12/18/18 12:15 PM  Result Value Ref Range   Troponin I (High Sensitivity) 8 <18 ng/L    Comment: (NOTE) Elevated high sensitivity troponin I (hsTnI) values and significant  changes across serial measurements may suggest ACS but many other  chronic and acute conditions are known to elevate hsTnI results.  Refer to the "Links" section for chest pain algorithms and additional  guidance. Performed at [facility], [address] [place], [place] [address]   Blood Culture (routine x 2)     Status: None (Preliminary result)   Collection Time: 12/18/18 12:31 PM   Specimen: BLOOD RIGHT HAND  Result Value Ref Range   Specimen Description BLOOD RIGHT HAND    Special Requests      BOTTLES DRAWN AEROBIC ONLY Blood Culture results may not be optimal due to an inadequate volume of blood received in culture bottles   Culture      NO GROWTH < 12 HOURS Performed at [facility], [address] [place], [place] [address]    Report Status PENDING  Lactic acid, plasma     Status: None   Collection Time: 12/18/18 12:44 PM  Result Value Ref Range   Lactic Acid, Venous 1.4 0.5 - 1.9 mmol/L    Comment: Performed at [facility], [address] [place], [place] [address]  POCT I-Stat EG7     Status:  Abnormal   Collection Time: 12/18/18 12:51 PM  Result Value Ref Range   pH, Ven 7.407 7.250 - 7.430   pCO2, Ven 38.8 (L) 44.0 - 60.0 mmHg   pO2, Ven 129.0 (H) 32.0 - 45.0 mmHg   Bicarbonate 24.4 20.0 - 28.0 mmol/L   TCO2 26 22 - 32 mmol/L   O2 Saturation 99.0 %   Sodium 133 (L) 135 - 145 mmol/L   Potassium 4.5 3.5 - 5.1 mmol/L   Calcium, Ion 1.18 1.15 - 1.40 mmol/L   HCT 22.0 (L) 36.0 - 46.0 %   Hemoglobin 7.5 (L) 12.0 - 15.0 g/dL   Patient temperature HIDE    Sample type VENOUS   Urinalysis, Routine w reflex microscopic     Status: Abnormal   Collection Time: 12/18/18  3:20 PM  Result Value Ref Range   Color, Urine YELLOW YELLOW   APPearance CLEAR CLEAR   Specific Gravity, Urine 1.029 1.005 - 1.030   pH 7.0 5.0 - 8.0   Glucose, UA NEGATIVE NEGATIVE mg/dL   Hgb urine dipstick NEGATIVE NEGATIVE   Bilirubin Urine NEGATIVE NEGATIVE   Ketones, ur NEGATIVE NEGATIVE mg/dL   Protein, ur NEGATIVE NEGATIVE mg/dL   Nitrite NEGATIVE NEGATIVE   Leukocytes,Ua TRACE (A) NEGATIVE   RBC / HPF 11-20 0 - 5 RBC/hpf   WBC, UA 21-50 0 - 5 WBC/hpf   Bacteria, UA RARE (A) NONE SEEN   Mucus PRESENT    Budding Yeast PRESENT     Comment: Performed at [facility], [address], [place], [place] [address]  Troponin I (High Sensitivity)     Status: None   Collection Time: 12/18/18  4:27 PM  Result Value Ref Range   Troponin I (High Sensitivity) 7 <18 ng/L    Comment: (NOTE) Elevated high sensitivity troponin I (hsTnI) values and significant  changes across serial measurements may suggest ACS but many other  chronic and acute conditions are known to elevate hsTnI results.  Refer to the "Links" section for chest pain algorithms and additional  guidance. Performed at [facility], [address] [place], [place] [address]   Type and screen [facility]     Status: None (Preliminary result)   Collection Time: 12/18/18  6:18 PM  Result Value Ref Range   ABO/RH(D) A  POS    Antibody Screen NEG    Sample Expiration 12/21/2018,2359    Unit Number [id number]    Blood Component Type RED CELLS,LR    Unit division 00    Status of Unit ISSUED    Transfusion Status OK TO TRANSFUSE    Crossmatch Result      Compatible Performed at [facility], [address], [place], [place] [address]   Prepare RBC     Status: None   Collection Time: 12/18/18  6:18 PM  Result Value Ref Range   Order Confirmation      ORDER PROCESSED BY BLOOD BANK Performed at [facility], [address], [place], [place] [address]   Brain natriuretic peptide     Status: Abnormal   Collection Time: 12/18/18  6:18  PM  Result Value Ref Range   B Natriuretic Peptide 291.9 (H) 0.0 - 100.0 pg/mL    Comment: Performed at [facility], [address] [place], [place] [address]  ABO/Rh     Status: None   Collection Time: 12/18/18  6:18 PM  Result Value Ref Range   ABO/RH(D)      A POS Performed at [facility], [address] [place], [place] [address]   POC occult blood, ED     Status: None   Collection Time: 12/18/18 11:07 PM  Result Value Ref Range   Fecal Occult Bld NEGATIVE NEGATIVE  Heparin level (unfractionated)     Status: Abnormal   Collection Time: 12/19/18  1:21 AM  Result Value Ref Range   Heparin Unfractionated 0.96 (H) 0.30 - 0.70 IU/mL    Comment: Performed at [facility], [address] [place], [place] [address]  Heparin level (unfractionated)     Status: Abnormal   Collection Time: 12/19/18  5:00 AM  Result Value Ref Range   Heparin Unfractionated 0.88 (H) 0.30 - 0.70 IU/mL    Comment: (NOTE) If heparin results are below expected values, and patient dosage has  been confirmed, suggest follow up testing of antithrombin III levels. Performed at [facility], [address], [place], [place] [address]   CBC     Status: Abnormal   Collection Time: 12/19/18  6:24 AM  Result Value Ref Range   WBC 11.6 (H) 4.0 - 10.5 K/uL    RBC 3.19 (L) 3.87 - 5.11 MIL/uL   Hemoglobin 10.0 (L) 12.0 - 15.0 g/dL    Comment: REPEATED TO VERIFY POST TRANSFUSION SPECIMEN    HCT 30.7 (L) 36.0 - 46.0 %   MCV 96.2 80.0 - 100.0 fL    Comment: POST TRANSFUSION SPECIMEN REPEATED TO VERIFY    MCH 31.3 26.0 - 34.0 pg   MCHC 32.6 30.0 - 36.0 g/dL   RDW 55.3 (H) 74.8 - 27.0 %   Platelets 282 150 - 400 K/uL   nRBC 0.0 0.0 - 0.2 %    Comment: Performed at [facility], [address] [place], [place] [address]   Ct Angio Chest Pe W And/or Wo Contrast  Result Date: 12/18/2018 CLINICAL DATA:  Shortness of breath. EXAM: CT ANGIOGRAPHY CHEST WITH CONTRAST TECHNIQUE: Multidetector CT imaging of the chest was performed using the standard protocol during bolus administration of intravenous contrast. Multiplanar CT image reconstructions and MIPs were obtained to evaluate the vascular anatomy. CONTRAST:  80 mL OMNIPAQUE IOHEXOL 350 MG/ML SOLN COMPARISON:  11/17/2018 and 11/12/2018 FINDINGS: Cardiovascular: Heart is normal size. Small amount of pericardial fluid is present anteriorly. Thoracic aorta demonstrates no evidence of aneurysm or dissection. There is mild calcified plaque over the thoracic aorta. Pulmonary or system is well opacified demonstrates suggestion of small emboli over the proximal left upper lobar pulmonary artery. There is also suggestion of subtle emboli over a subsegmental right middle lobe pulmonary artery. RV/LV ratio 0.96. Remaining vascular structures are unremarkable. Mediastinum/Nodes: 1 cm precarinal lymph node is present. 1.3 cm subcarinal lymph node. No significant hilar adenopathy. Remaining mediastinal structures are unremarkable. Lungs/Pleura: Lungs are adequately inflated demonstrate small bilateral pleural effusions with associated posterior atelectatic change. Subtle patchy density over the right upper lobe with mild nodularity as this could represent infection infection. Airways are unremarkable. Upper Abdomen:  No acute findings. Mild calcified plaque over the abdominal aorta. Musculoskeletal: Degenerative change of the  spine. Review of the MIP images confirms the above findings. IMPRESSION: 1. Findings suggesting subtle emboli over the proximal left upper lobe pulmonary artery and subsegmental right middle lobar pulmonary artery. RV/LV ratio slightly elevated at 0.96 indicating mild right heart strain. 2. Small bilateral pleural effusions with associated atelectasis. Mild patchy post density over the right upper lobe with mild nodularity which may be due to infection. Minimal mediastinal adenopathy likely reactive. Recommend follow-up CT 4-6 weeks. 3.  Aortic Atherosclerosis (ICD10-I70.0). Critical Value/emergent results were called by telephone at the time of interpretation on 12/18/2018 at 2:46 pm to providerBOWIE TRAN , who verbally acknowledged these results. Electronically Signed   By: Elberta Fortis M.D.   On: 12/18/2018 14:50   Dg Chest Port 1 View  Result Date: 12/18/2018 CLINICAL DATA:  85 year old female with shortness of breath and cough. EXAM: PORTABLE CHEST 1 VIEW COMPARISON:  Chest CT dated 11/17/2018 and radiograph dated 11/12/2018 FINDINGS: Right-sided PICC with tip at the cavoatrial junction. Left lung base density, likely combination of trace pleural effusion and subsegmental atelectasis/scarring the right lung is clear. There is no pneumothorax. Stable cardiomediastinal silhouette. The aorta is tortuous. There is atherosclerotic calcification of the aorta. No acute osseous pathology. Developing infiltrate is not excluded. Clinical correlation is recommended. IMPRESSION: 1. Right-sided PICC with tip at the cavoatrial junction. No pneumothorax. 2. Left lung base density, likely combination of trace pleural effusion and atelectasis/scarring. Developing infiltrate is not excluded. Electronically Signed   By: Elgie Collard M.D.   On: 12/18/2018 11:57    Pending Labs Unresulted Labs (From admission,  onward)    Start     Ordered   12/19/18 1200  Heparin level  Once-Timed,   STAT     12/19/18 0259   12/19/18 0500  Heparin level (unfractionated)  Daily,   R     12/18/18 1615   12/19/18 0500  CBC  Daily,   R     12/18/18 1615   12/19/18 0500  Basic metabolic panel  Tomorrow morning,   R     12/18/18 1759   12/18/18 1810  Occult blood card to lab, stool  ONCE - STAT,   STAT     12/18/18 1809   12/18/18 1056  Urine culture  ONCE - STAT,   STAT     12/18/18 1057   12/18/18 1056  Blood gas, venous ([facility], [facility], [facility])  ONCE - STAT,   STAT     12/18/18 1057          Vitals/Pain Today's Vitals   12/18/18 2020 12/19/18 0115 12/19/18 0330 12/19/18 0400  BP: (!) 122/52 (!) 120/50  116/80  Pulse: 82 71 73 80  Resp: Temp: 97.9 F (36.6 C)     TempSrc: Oral     SpO2: 100% 96% 95% 96%  Weight:      Height:      PainSc:        Isolation Precautions No active isolations  Medications Medications  sodium chloride flush (NS) 0.9 % injection 10-40 mL (has no administration in time range)  ceFEPIme (MAXIPIME) 2 g in sodium chloride 0.9 % 100 mL IVPB (0 g Intravenous Stopped 12/19/18 0300)  vancomycin (VANCOCIN) IVPB 1000 mg/200 mL premix (has no administration in time range)  heparin ADULT infusion 100 units/mL (25000 units/254mL sodium chloride 0.45%) (950 Units/hr Intravenous Rate/Dose Change 12/19/18 0331)  diclofenac sodium (VOLTAREN) 1 % transdermal gel 4 g (has no administration in time range)  sodium chloride flush (NS) 0.9 % injection 3 mL (3 mLs Intravenous Not Given 12/18/18 2219)  acetaminophen (TYLENOL) tablet 650 mg (has no administration in time range)    Or  acetaminophen (TYLENOL) suppository 650 mg (has no administration in time range)  ondansetron (ZOFRAN) tablet 4 mg (has no administration in time range)    Or  ondansetron (ZOFRAN) injection 4 mg (has no administration in time range)  albuterol (PROVENTIL) (2.5 MG/3ML) 0.083% nebulizer solution 2.5 mg (has no  administration in time range)  ceFEPIme (MAXIPIME) 2 g in sodium chloride 0.9 % 100 mL IVPB (0 g Intravenous Stopped 12/18/18 1511)  vancomycin (VANCOCIN) 1,500 mg in sodium chloride 0.9 % 500 mL IVPB (0 mg Intravenous Stopped 12/18/18 1511)  iohexol (OMNIPAQUE) 350 MG/ML injection 80 mL (100 mLs Intravenous Contrast Given 12/18/18 1351)  heparin bolus via infusion 5,000 Units (5,000 Units Intravenous Bolus from Bag 12/18/18 1601)  0.9 %  sodium chloride infusion (Manually program via Guardrails IV Fluids) ( Intravenous Stopped 12/19/18 0329)  furosemide (LASIX) injection 20 mg (20 mg Intravenous Given 12/18/18 2033)    Mobility non-ambulatory High fall risk   Focused Assessments    R Recommendations: See Admitting Provider Note  Report given to:   Additional Notes:

## 2018-12-20 DIAGNOSIS — G061 Intraspinal abscess and granuloma: Secondary | ICD-10-CM

## 2018-12-20 DIAGNOSIS — Z881 Allergy status to other antibiotic agents status: Secondary | ICD-10-CM

## 2018-12-20 DIAGNOSIS — Z91048 Other nonmedicinal substance allergy status: Secondary | ICD-10-CM

## 2018-12-20 DIAGNOSIS — I2609 Other pulmonary embolism with acute cor pulmonale: Secondary | ICD-10-CM

## 2018-12-20 DIAGNOSIS — I447 Left bundle-branch block, unspecified: Secondary | ICD-10-CM

## 2018-12-20 DIAGNOSIS — G062 Extradural and subdural abscess, unspecified: Secondary | ICD-10-CM

## 2018-12-20 DIAGNOSIS — I2690 Septic pulmonary embolism without acute cor pulmonale: Secondary | ICD-10-CM

## 2018-12-20 DIAGNOSIS — I872 Venous insufficiency (chronic) (peripheral): Secondary | ICD-10-CM

## 2018-12-20 DIAGNOSIS — B9561 Methicillin susceptible Staphylococcus aureus infection as the cause of diseases classified elsewhere: Secondary | ICD-10-CM

## 2018-12-20 DIAGNOSIS — G9341 Metabolic encephalopathy: Secondary | ICD-10-CM

## 2018-12-20 DIAGNOSIS — Z515 Encounter for palliative care: Secondary | ICD-10-CM

## 2018-12-20 DIAGNOSIS — K6812 Psoas muscle abscess: Secondary | ICD-10-CM

## 2018-12-20 DIAGNOSIS — Z888 Allergy status to other drugs, medicaments and biological substances status: Secondary | ICD-10-CM

## 2018-12-20 DIAGNOSIS — I10 Essential (primary) hypertension: Secondary | ICD-10-CM

## 2018-12-20 DIAGNOSIS — G629 Polyneuropathy, unspecified: Secondary | ICD-10-CM

## 2018-12-20 DIAGNOSIS — F419 Anxiety disorder, unspecified: Secondary | ICD-10-CM

## 2018-12-20 DIAGNOSIS — M4646 Discitis, unspecified, lumbar region: Secondary | ICD-10-CM

## 2018-12-20 DIAGNOSIS — K219 Gastro-esophageal reflux disease without esophagitis: Secondary | ICD-10-CM

## 2018-12-20 DIAGNOSIS — R7881 Bacteremia: Secondary | ICD-10-CM

## 2018-12-20 DIAGNOSIS — E039 Hypothyroidism, unspecified: Secondary | ICD-10-CM

## 2018-12-20 LAB — CBC
HCT: 32.6 % — ABNORMAL LOW (ref 36.0–46.0)
Hemoglobin: 10.8 g/dL — ABNORMAL LOW (ref 12.0–15.0)
MCH: 31.9 pg (ref 26.0–34.0)
MCHC: 33.1 g/dL (ref 30.0–36.0)
MCV: 96.2 fL (ref 80.0–100.0)
Platelets: 274 10*3/uL (ref 150–400)
RBC: 3.39 MIL/uL — ABNORMAL LOW (ref 3.87–5.11)
RDW: 17.2 % — ABNORMAL HIGH (ref 11.5–15.5)
WBC: 12.5 10*3/uL — ABNORMAL HIGH (ref 4.0–10.5)
nRBC: 0 % (ref 0.0–0.2)

## 2018-12-20 LAB — COMPREHENSIVE METABOLIC PANEL
ALT: 28 U/L (ref 0–44)
AST: 33 U/L (ref 15–41)
Albumin: 1.6 g/dL — ABNORMAL LOW (ref 3.5–5.0)
Alkaline Phosphatase: 113 U/L (ref 38–126)
Anion gap: 10 (ref 5–15)
BUN: 6 mg/dL — ABNORMAL LOW (ref 8–23)
CO2: 20 mmol/L — ABNORMAL LOW (ref 22–32)
Calcium: 8 mg/dL — ABNORMAL LOW (ref 8.9–10.3)
Chloride: 101 mmol/L (ref 98–111)
Creatinine, Ser: 0.78 mg/dL (ref 0.44–1.00)
GFR calc Af Amer: >60 mL/min (ref >60)
GFR calc non Af Amer: >60 mL/min (ref >60)
Glucose, Bld: 107 mg/dL — ABNORMAL HIGH (ref 70–99)
Potassium: 3.8 mmol/L (ref 3.5–5.1)
Sodium: 131 mmol/L — ABNORMAL LOW (ref 135–145)
Total Bilirubin: 0.5 mg/dL (ref 0.3–1.2)
Total Protein: 5.1 g/dL — ABNORMAL LOW (ref 6.5–8.1)

## 2018-12-20 LAB — MAGNESIUM: Magnesium: 1.7 mg/dL (ref 1.7–2.4)

## 2018-12-20 LAB — HEPARIN LEVEL (UNFRACTIONATED): Heparin Unfractionated: 0.42 IU/mL (ref 0.30–0.70)

## 2018-12-20 LAB — MRSA PCR SCREENING: MRSA by PCR: NEGATIVE

## 2018-12-20 MED ORDER — HYDROCODONE-ACETAMINOPHEN 5-325 MG PO TABS: 2.0000 | ORAL_TABLET | ORAL | Status: DC | PRN

## 2018-12-20 MED ORDER — ENSURE ENLIVE PO LIQD
237.0000 mL | Freq: Three times a day (TID) | ORAL | Status: DC
  Administered 2018-12-21 – 2018-12-28 (×19): 237 mL via ORAL

## 2018-12-20 MED ORDER — SODIUM CHLORIDE 0.9 % IV BOLUS
250.0000 mL | Freq: Two times a day (BID) | INTRAVENOUS | Status: DC
  Administered 2018-12-20 – 2018-12-26 (×12): 250 mL via INTRAVENOUS
  Administered 2018-12-27: 22:00:00 via INTRAVENOUS
  Administered 2018-12-27 – 2018-12-28 (×3): 250 mL via INTRAVENOUS

## 2018-12-20 MED ORDER — LORAZEPAM 0.5 MG PO TABS
0.5000 mg | ORAL_TABLET | Freq: Two times a day (BID) | ORAL | Status: DC | PRN
  Administered 2018-12-20 – 2018-12-26 (×3): 0.5 mg via ORAL
  Filled 2018-12-20 (×5): qty 1

## 2018-12-20 MED ORDER — FUROSEMIDE 10 MG/ML IJ SOLN
60.0000 mg | Freq: Once | INTRAMUSCULAR | Status: AC
  Administered 2018-12-20: 60 mg via INTRAVENOUS
  Filled 2018-12-20: qty 6

## 2018-12-20 MED ORDER — DULOXETINE HCL 30 MG PO CPEP
30.0000 mg | ORAL_CAPSULE | Freq: Every day | ORAL | Status: DC
  Administered 2018-12-20 – 2018-12-28 (×8): 30 mg via ORAL
  Filled 2018-12-20 (×9): qty 1

## 2018-12-20 MED ORDER — LORAZEPAM 0.5 MG PO TABS: 0.5000 mg | ORAL_TABLET | Freq: Two times a day (BID) | ORAL | Status: DC

## 2018-12-20 MED ORDER — ADULT MULTIVITAMIN W/MINERALS CH
1.0000 | ORAL_TABLET | Freq: Every day | ORAL | Status: DC
  Administered 2018-12-21 – 2018-12-28 (×8): 1 via ORAL
  Filled 2018-12-20 (×8): qty 1

## 2018-12-20 MED ORDER — SODIUM CHLORIDE 0.9 % IV SOLN
12.0000 g | INTRAVENOUS | Status: DC
  Administered 2018-12-20 – 2018-12-22 (×3): 12 g via INTRAVENOUS
  Filled 2018-12-20 (×4): qty 12000

## 2018-12-20 MED ORDER — ATORVASTATIN CALCIUM 10 MG PO TABS
20.0000 mg | ORAL_TABLET | Freq: Every day | ORAL | Status: DC
  Administered 2018-12-20 – 2018-12-28 (×9): 20 mg via ORAL
  Filled 2018-12-20 (×9): qty 2

## 2018-12-20 NOTE — Consult Note (Signed)
Consultation Note Date: 12/20/2018   Patient Name: Autumn [name]  DOB: 1932-03-11  MRN: [id number]  Age / Sex: 85 y.o., female  PCP: Lajean Manes, MD Referring Physician: Aline August, MD  Reason for Consultation: Establishing goals of care and Psychosocial/spiritual support  HPI/Patient Profile: 85 y.o. female  with past medical history of CHF (EF 55-60%), peripheral neuropathy, who had a recent admission for MSSA bacteremia.  This admission was complicated by PE, the discovery of endocarditis and stroke.  She was discharged to [facility] on IV antibiotics.  She returns after approximate 1 week and was admitted on 12/18/2018 with fever and altered mental status.  Despite being on IV antibiotics for endocarditis she is found to have osteomyelitis, discitis in L1-L2, epidural abscess, and PE.  MR brain reveals new infarcts bilaterally.    PMT had the pleasure of meeting the [place] family last admission.  Autumn [name] is currently a DNR (if she arrests) whose family desires full scope treatment for her if she has not arrested.   Clinical Assessment and Goals of Care:  I have reviewed medical records including EPIC notes, labs and imaging, received report from the attending physician, assessed the patient and then spoke on the phone with son Autumn [name] to discuss diagnosis prognosis, [place], EOL wishes, disposition and options.  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  She is [place] and has 2 sons Autumn [name] and [place].  Prior to her October admission she lived in independent living.  Per Gerald Stabs In October she was shopping, walking around and independent.  As far as functional and nutritional status Mrs. Wolaver is severely limited by back pain.  She has not been eating well as evidenced by her albumin of 1.6.  I spoke with Autumn [name] about her 4 areas of  stroke.  We talked about her disorientation and confusion.  Autumn [name] recognizes that his mother's mental status is off but he is able to orient her by spending time with her.  Autumn [name] is quite surprised by how severe his mother's illness is - "She doesn't seem as sick as she was last admission"  From previous conversations I know that [name] values her independence.  While she wants to get better and accept reasonable treatments she does not want to live a life bed bound and dependent.  The difference between aggressive medical intervention and comfort care was considered in light of the patient's goals of care.  At this point the patient and family are still seeking aggressive medical intervention  Questions and concerns were addressed.  The family was encouraged to call with questions or concerns.  Autumn [name] thanked me for our discussion and encouraged me to talk with his brother Autumn [name] tomorrow (11/7).    Primary Decision Maker:  NEXT OF KIN sons Autumn [name] and Autumn [name]    SUMMARY OF RECOMMENDATIONS    Patient is confused.  Due to her confusion and altered mental status per [facility] visitation policy she  is allowed to have 1 family member with her at all times.  It does not have to be consistently the same family member.  She is DNR and desires full scope medical treatment at this time.  Delirium precautions  PMT will follow with you meeting with the patient and sons regularly to assist with communication and goals of care as the patient's medical condition evolves.  Code Status/Advance Care Planning:  DNR   Symptom Management:   Added back lorazepam but made it BID PRN anxiety, insomnia.  Has tramadol for pain.  Would consider adding back hydrocodone.  Additional Recommendations (Limitations, Scope, Preferences):  Full Scope Treatment  Palliative Prophylaxis:   Delirium Protocol, Frequent checks for pain.  Psycho-social/Spiritual:   Desire for further Chaplaincy support:  Welcomed   Prognosis:  Patient is at high risk for acute decompensation and decline.  She has endocarditis and seems to keep throwing clots to her brain and lungs.  Recent MSSA bacteremia appears to have also settled in L1-L2 area with osteomyelitis, discitis and epidural abscess.    Discharge Planning: to be determined.      Primary Diagnoses: Present on Admission: . Sepsis (HCC) . HCAP (healthcare-associated pneumonia) . Acute metabolic encephalopathy . Acute on chronic combined systolic and diastolic CHF (congestive heart failure) (HCC) . MSSA bacteremia . Macrocytic anemia . Acute respiratory failure with hypoxia (HCC)   I have reviewed the medical record, interviewed the patient and family, and examined the patient. The following aspects are pertinent.  Past Medical History:  Diagnosis Date  . Anxiety   . GERD (gastroesophageal reflux disease)   . Hypertension   . Hypothyroid   . LBBB (left bundle branch block)   . MRSA bacteremia 11/19/2018  . Osteopenia   . Peripheral neuropathy   . Sepsis (HCC) 12/2018  . Venous insufficiency    Social History   Socioeconomic History  . Marital status: Widowed    Spouse name: Not on file  . Number of children: 2  . Years of education: Not on file  . Highest education level: Not on file  Occupational History  . Not on file  Social Needs  . Financial resource strain: Not on file  . Food insecurity    Worry: Not on file    Inability: Not on file  . Transportation needs    Medical: Not on file    Non-medical: Not on file  Tobacco Use  . Smoking status: Never Smoker  . Smokeless tobacco: Never Used  Substance and Sexual Activity  . Alcohol use: Yes    Frequency: Never    Comment: Once every three months   . Drug use: No  . Sexual activity: Not on file  Lifestyle  . Physical activity    Days per week: Not on file    Minutes per session: Not on file  . Stress: Not on file  Relationships  . Social Musician on  phone: Not on file    Gets together: Not on file    Attends religious service: Not on file    Active member of club or organization: Not on file    Attends meetings of clubs or organizations: Not on file    Relationship status: Not on file  Other Topics Concern  . Not on file  Social History Narrative   Lives alone.     Family History  Problem Relation Age of Onset  . Hypertension Mother   . Prostate cancer Father  Scheduled Meds: . DULoxetine  30 mg Oral Daily  . [START ON 12/21/2018] feeding supplement (ENSURE ENLIVE)  237 mL Oral TID BM  . [START ON 12/21/2018] multivitamin with minerals  1 tablet Oral Daily  . sodium chloride flush  3 mL Intravenous Q12H   Continuous Infusions: . heparin 950 Units/hr (12/20/18 1316)  . nafcillin (NAFCIL) continuous infusion 12 g (12/20/18 1336)  . sodium chloride 250 mL (12/20/18 1322)   PRN Meds:.acetaminophen **OR** acetaminophen, albuterol, diclofenac sodium, HYDROcodone-acetaminophen, LORazepam, ondansetron **OR** ondansetron (ZOFRAN) IV, sodium chloride flush, traMADol Allergies  Allergen Reactions  . Chlorhexidine   . Hydrochlorothiazide Other (See Comments)    REACTION: hyponatremia  . Ramipril Other (See Comments)    REACTION: GI upset  . Remeron [Mirtazapine] Other (See Comments)    REACTION: weakness  . Sulfa Antibiotics Rash   Review of Systems patient complains of right arm pain from her IV.  She is confused and unable to give an accurate ROS  Physical Exam  Well developed female awake, alert, confused (paranoid?) CV rrr resp no distress Abdomen soft, nt, nd Extremities - UE with edema and generalized redness  Vital Signs: BP (!) 145/67 (BP Location: Left Arm)   Pulse 81   Temp (!) 97.4 F (36.3 C) (Oral)   Resp (!) 23   Ht 5\' 3"  (1.6 m)   Wt 62.2 kg   SpO2 97%   BMI 24.29 kg/m  Pain Scale: 0-10   Pain Score: 2    SpO2: SpO2: 97 % O2 Device:SpO2: 97 % O2 Flow Rate: .O2 Flow Rate (L/min): 2 L/min  IO:  Intake/output summary:   Intake/Output Summary (Last 24 hours) at 12/20/2018 1729 Last data filed at 12/20/2018 0656 Gross per 24 hour  Intake 210.73 ml  Output 750 ml  Net -539.27 ml    LBM:   Baseline Weight: Weight: 75.4 kg(per last visit) Most recent weight: Weight: 62.2 kg     Palliative Assessment/Data: 30%     Time In: 3:00 Time Out: 4:10 Time Total: 70 min. Visit consisted of counseling and education dealing with the complex and emotionally intense issues surrounding the need for palliative care and symptom management in the setting of serious and potentially life-threatening illness. Greater than 50%  of this time was spent counseling and coordinating care related to the above assessment and plan.  Signed by: Norvel Richards, PA-C Palliative Medicine Pager: [phone]  Please contact Palliative Medicine Team phone at [phone] for questions and concerns.  For individual provider: See Loretha Stapler

## 2018-12-20 NOTE — Evaluation (Signed)
 Occupational Therapy Evaluation Patient Details Name: Autumn [name] MRN: [id number] DOB: 12-Jan-1933 Today's Date: 12/20/2018    History of Present Illness 85 y.o. female with fairly complex recent past medical history including admission from 11/12/2018-11/26/2018 during which she was treated for acute ischemic left cerebellar CVA, MSSA bacteremia with signs of posterior mitral valve vegetation consistent with endocarditis with TEE on 11/20/2018 treated with IV Rocephin (last date tentatively 11/17) discharged to SNF, who presented 11/4 with altered mental status. She was found to be febrile and admitted for sepsis secondary to HCAP on vanc and cefepime, also found to have PE and started on heparin drip and new ischemic strokes. Today, she was complaining of severe lower back pain and an MRI was ordered revealed L1-L2 discitis osteomyelitis with associated epidural abscess   Clinical Impression   Patient is a 85 year old female who lives alone in an apartment with elevator access and is independent with self cares. Pt uses a cane for community distances, drives, gets her own groceries. Patient having significant back pains with mobility limiting her participation in therapy this date. Patient require mod to max verbal/tactile cues for sequencing log roll and max A x2 for all bed mobility. Patient was unable to let go of edge of bed to participate in grooming/hygiene tasks while sitting upright. Spoke with nurse practitioner, MD and patient about ordering back brace to provide patient with more support.    Follow Up Recommendations  SNF;Supervision/Assistance - 24 hour    Equipment Recommendations  Other (comment)(to be determined at next venue)       Precautions / Restrictions Precautions Precautions: Fall Precaution Comments: severe back pain, decub right heel Restrictions Weight Bearing Restrictions: No      Mobility Bed Mobility Overal bed mobility: Needs Assistance Bed Mobility:  Rolling Rolling: Max assist;+2 for physical assistance Sidelying to sit: Max assist;+2 for physical assistance     Sit to sidelying: Max assist General bed mobility comments: Pt needed max encouragement to roll and sit up EOB.  pt educated in good body mechanics.  Pt with intense low back pain during transition.  Pt needed max assist of 2 to sit.  Transfers Overall transfer level: Needs assistance               General transfer comment: pt not wanting to get into chair.  pt sat EOB for 10 minutes - unable to do any UE tasks as needing UEs supported on bed to help manage back pain.  pt educated on keeping feet on the floor to assist with balance. Pt had BM during bed mobs, returned supine and nursing present to clean her up.  Pt did use LEs to help scoot herself up in the bed with +2 assist    Balance Overall balance assessment: Needs assistance Sitting-balance support: Feet supported;Bilateral upper extremity supported Sitting balance-Leahy Scale: Poor                                     ADL either performed or assessed with clinical judgement   ADL Overall ADL's : Needs assistance/impaired     Grooming: Wash/dry face;Total assistance;Sitting Grooming Details (indicate cue type and reason): pt unable to initiate lifting UEs off edge of bed due to back pain Upper Body Bathing: Sitting;Total assistance   Lower Body Bathing: Total assistance;Sitting/lateral leans   Upper Body Dressing : Total assistance;Sitting   Lower Body Dressing: Total assistance;Bed  level Lower Body Dressing Details (indicate cue type and reason): to don bilateral socks, patient having significant back pain Toilet Transfer: Maximal assistance;+2 for safety/equipment;+2 for physical assistance;Stand-pivot;BSC   Toileting- Clothing Manipulation and Hygiene: Total assistance;+2 for physical assistance;+2 for safety/equipment;Sit to/from stand               Vision Baseline  Vision/History: Wears glasses Wears Glasses: At all times Patient Visual Report: No change from baseline Vision Assessment?: No apparent visual deficits            Pertinent Vitals/Pain Pain Assessment: Faces Faces Pain Scale: Hurts whole lot Pain Location: low back Pain Descriptors / Indicators: Pressure;Constant;Grimacing;Crushing;Moaning;Guarding Pain Intervention(s): Limited activity within patient's tolerance;Monitored during session;Repositioned     Hand Dominance Right   Extremity/Trunk Assessment Upper Extremity Assessment Upper Extremity Assessment: Generalized weakness   Lower Extremity Assessment Lower Extremity Assessment: Defer to PT evaluation RLE Deficits / Details: pt with PF contracture - only able to get 20 degrees from neutral with stretching.  pt tight in heelcords.  Hip and knee WFL - PROM.  pt able to do SLR in the bed LLE Deficits / Details: Pt with PF contracture - only abel to get 10 degrees from neutral with stretching. pt tight in heelcords,  hip and knee WFL - PROM with compliants of left knee sorenss with flexion.  Pt unable to do SLR supine   Cervical / Trunk Assessment Cervical / Trunk Assessment: (Pt sat EOB - unable to take UEs off the bed due to back pain.  pt sat erect with cues.)   Communication Communication Communication: No difficulties   Cognition Arousal/Alertness: Awake/alert Behavior During Therapy: Anxious(patient anxious about moving due to pain) Overall Cognitive Status: Impaired/Different from baseline Area of Impairment: Following commands;Safety/judgement;Attention                   Current Attention Level: Sustained   Following Commands: Follows one step commands with increased time Safety/Judgement: Decreased awareness of safety   Problem Solving: Slow processing;Requires verbal cues;Requires tactile cues;Decreased initiation General Comments: 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 she is now not  wanting to move - difficulty asking for whatt she wants.  asking to sit down - when she is sitting - she was meaning she wanted to lay down   General Comments  bilateral UE edema/redness            Home Living Family/patient expects to be discharged to:: Skilled nursing facility Living Arrangements: Alone Available Help at Discharge: Available PRN/intermittently Type of Home: Apartment Home Access: Elevator     Home Layout: One level     Bathroom Shower/Tub: Producer, television/film/video: Handicapped height Bathroom Accessibility: Yes How Accessible: Accessible via walker Home Equipment: Grab bars - tub/shower;Grab bars - toilet   Additional Comments: [facility] independent living prior to CVA 3 weeks ago - pt has been at [facility] since then      Prior Functioning/Environment Level of Independence: Independent        Comments: patient reports using a cane for community distances, otherwise fully independent with self care/mobility.         OT Problem List: Decreased strength;Decreased range of motion;Decreased activity tolerance;Impaired balance (sitting and/or standing);Decreased safety awareness;Decreased knowledge of precautions;Pain;Increased edema      OT Treatment/Interventions: Self-care/ADL training;Therapeutic exercise;Energy conservation;DME and/or AE instruction;Therapeutic activities;Patient/family education;Balance training;Cognitive remediation/compensation    OT Goals(Current goals can be found in the care plan section) Acute  Rehab OT Goals Patient Stated Goal: To try to move OT Goal Formulation: With patient Time For Goal Achievement:  Potential to Achieve Goals: Good  OT Frequency: Min 2X/week           Co-evaluation PT/OT/SLP Co-Evaluation/Treatment: Yes Reason for Co-Treatment: Complexity of the patient's impairments (multi-system involvement);For patient/therapist safety;To address functional/ADL transfers PT goals  addressed during session: Mobility/safety with mobility OT goals addressed during session: ADL's and self-care      AM-PAC OT "6 Clicks" Daily Activity     Outcome Measure Help from another person eating meals?: A Little Help from another person taking care of personal grooming?: Total Help from another person toileting, which includes using toliet, bedpan, or urinal?: Total Help from another person bathing (including washing, rinsing, drying)?: Total Help from another person to put on and taking off regular upper body clothing?: Total Help from another person to put on and taking off regular lower body clothing?: Total 6 Click Score: 8   End of Session Nurse Communication: Mobility status  Activity Tolerance: Patient limited by pain Patient left: in bed;with call bell/phone within reach;with nursing/sitter in room;with family/visitor present  OT Visit Diagnosis: Unsteadiness on feet (R26.81);Other abnormalities of gait and mobility (R26.89);Muscle weakness (generalized) (M62.81);Pain Pain - part of body: (back)                Time: 4765-4650 OT Time Calculation (min): 35 min Charges:  OT General Charges $OT Visit: 1 Visit OT Evaluation $OT Eval Moderate Complexity: 1 Mod OT Treatments $Self Care/Home Management : 8-22 mins  Shon Millet OT OT office: [place] 12/20/2018, 11:52 AM

## 2018-12-20 NOTE — Progress Notes (Signed)
Pt c/o pain in RUA. Noted redness and swelling in IV not being used for IV Abx. Nonetheless, Abx stopped and IV removed. Area marked to assess for further redness or swelling. IV team consulted regarding appropriateness of distal forearm IV for continuous Abx. It was recommended distal forearm IV be removed as well. New IV placed in RUA. Continuous abx restarted. Will continue to monitor.

## 2018-12-20 NOTE — Progress Notes (Signed)
[place] for heparin Indication: pulmonary embolus  Allergies  Allergen Reactions  . Chlorhexidine   . Hydrochlorothiazide Other (See Comments)    REACTION: hyponatremia  . Ramipril Other (See Comments)    REACTION: GI upset  . Remeron [Mirtazapine] Other (See Comments)    REACTION: weakness  . Sulfa Antibiotics Rash    Patient Measurements: Height: 5\' 3"  (160 cm) Weight: 137 lb 2 oz (62.2 kg) IBW/kg (Calculated) : 52.4 Heparin Dosing Weight: 68.5 kg  Vital Signs: Temp: 98.8 F (37.1 C) (11/06 0429) Temp Source: Oral (11/06 0044) BP: 132/60 (11/06 0313) Pulse Rate: 80 (11/06 0313)  Labs: Recent Labs    12/18/18 1056 12/18/18 1215 12/18/18 1251 12/18/18 1627  12/19/18 0624 12/19/18 1432 12/19/18 2318 12/20/18 0522  HGB 7.9*  --  7.5*  --   --  10.0*  --   --  10.8*  HCT 24.9*  --  22.0*  --   --  30.7*  --   --  32.6*  PLT 261  --   --   --   --  282  --   --  274  APTT 41*  --   --   --   --   --   --   --   --   LABPROT 15.1  --   --   --   --   --   --   --   --   INR 1.2  --   --   --   --   --   --   --   --   HEPARINUNFRC  --   --   --   --    < >  --  0.69 0.44 0.42  CREATININE 0.85  --   --   --   --  0.75  --   --  0.78  TROPONINIHS  --  8  --  7  --   --   --   --   --    < > = values in this interval not displayed.    Estimated Creatinine Clearance: 43.3 mL/min (by C-G formula based on SCr of 0.78 mg/dL).  Assessment: 85 y.o. female with PE for heparin. Heparin level therapeutic at 0.42. No bleeding noted.   Goal of Therapy:  Heparin level 0.3-0.7 units/ml Monitor platelets by anticoagulation protocol: Yes   Plan:  Continue Heparin at 950 units/hr Check heparin level daily.    Aerie Donica A. Levada Dy, PharmD, BCPS, FNKF Clinical Pharmacist [facility] Please utilize Amion for appropriate phone number to reach the unit pharmacist ([place])    12/20/2018,7:46 AM

## 2018-12-20 NOTE — Progress Notes (Signed)
Patient ID: Autumn [name], female   DOB: [birth year], 85 y.o.   MRN: [id number]  PROGRESS NOTE    ALDENA WORM  WUJ:[id number] DOB: 11/01/1932 DOA: 12/18/2018 PCP: Merlene Laughter, MD   Brief Narrative:  85 year old female with history of hypertension, chronic systolic CHF, hypothyroidism, left bundle branch block, peripheral neuropathy, admission from 11/12/2018-11/26/2018 during which she was treated for acute ischemic left cerebellar CVA, MSSA bacteremia with signs of posterior mitral valve vegetation consistent with endocarditis with TEE on 11/20/2018 as well as acute and chronic CHF with EF decrease down to 25% during which time patient had declined aggressive work-up by cardiology and gastroenterology and she was treated with Rocephin with plan to complete intravenous Rocephin till 12/31/2018 for endocarditis as per ID recommendations and was discharged to rehab facility with indwelling Foley catheter.  Since the stroke, patient has basically been bedbound.  She presented on 12/18/2018 with altered mental status from rehab facility and was found to be febrile.  She was admitted for sepsis secondary to healthcare associated pneumonia and started on broad-spectrum antibiotics.  She was also found to have pulmonary emboli and started on heparin drip.  Assessment & Plan:   Sepsis: Present on admission Probable healthcare associated pneumonia with concern for gram-negative rods versus MRSA pneumonia History of recent MSSA bacteremia and probable mitral valve endocarditis -Patient was on Rocephin prior to presentation till 12/31/2018 as per ID recommendations.  PICC line was discontinued on presentation. -Admitting provider discussed case with Dr. Daiva Eves from ID on presentation and patient was started on empiric vancomycin and cefepime -SLP evaluation. -Hemodynamically improving.  Off IV fluids. -Follow cultures.  COVID-19 testing negative.  Acute L1-L2 discitis/osteomyelitis with possible epidural  abscess and bilateral psoas abscesses Back pain -MRI of thoracolumbar spine showed findings as above.  Neurosurgery was consulted.  Neurosurgery recommends conservative management with antibiotics for now as per ID recommendations.  Will consult ID.  If needed, could consider IR aspiration.  Acute hypoxic respiratory failure -Required supplemental oxygen on presentation.  Currently on room air.  Incentive spirometry.  Pulmonary emboli -CT angiogram of the chest revealed subtle pulmonary emboli of the right and left lungs with mild right heart strain. -Continue Heparin per pharmacy.  Will probably switch to oral anticoagulation once cleared by neurology. -Echo showed EF of 55 to 60% -Lower extremity duplex ultrasound was negative for DVT.  New acute ischemic strokes in a patient with history of recent ischemic left cerebellar CVA Acute metabolic encephalopathy -MRI of the brain on 12/19/2018 showed new areas of acute infarct.  Neurology was consulted.  CTA of the head and neck was stable as compared to recent imaging.  Combined systolic and diastolic heart failure -Currently euvolemic.  Last EF was noted to be around 25%.  Limited echo done yesterday showed EF of 55 to 60%.  Strict input and output.  Daily weights.  Will restart Lasix.  Fluid restriction.  Hypoalbuminemia -Probably from very poor oral intake.  Nutrition consult.  Anemia of chronic disease -Probably from above.  Status post 1 unit packed red cell transfusion on 12/18/2018.  H&H stable.  Generalized deconditioning -PT/OT eval.  Overall prognosis is guarded to poor.  Request palliative care consultation for goals of care discussion.    DVT prophylaxis: Heparin Code Status: DNR Family Communication: Spoke to son/Kurt at bedside on 12/20/2018 Disposition Plan: Depends on clinical outcome  Consultants: Admitting provider spoke to ID/Dr. Daiva Eves on phone Neurosurgery/neurology  Procedures:  Echo   1. Left ventricular  ejection fraction, by visual estimation, is 55 to 60%. The left ventricle has normal function. There is mildly increased left ventricular hypertrophy.  2. Global right ventricle has normal systolic function.The right ventricular size is normal. No increase in right ventricular wall thickness.  3. Left atrium is mildly dialted.  4. The mitral valve is abnormal. Mild mitral valve regurgitation. There is moderate calcification of the mitral valve leaflets. I do not see an obvious vegetation but image qualite is poor.  5. Right atrial size was normal.  6. The aortic valve was not well visualized. Aortic valve regurgitation is not visualized.  7. The pulmonic valve was not well visualized. Pulmonic valve regurgitation is not visualized.  8. Abnormal septal motion consistent with left bundle branch block.  9. The inferior vena cava is normal in size with greater than 50% respiratory variability, suggesting right atrial pressure of 3 mmHg. 10. Limited echo with poor images. 11. Moderate calcification of the mitral valve leaflet(s). 12. The tricuspid valve is normal in structure. Tricuspid valve regurgitation is trivial.  Antimicrobials:  Vancomycin and cefepime from 12/18/2018 onwards  Subjective: Patient seen and examined at bedside.  She is a poor historian.  Still feels very weak.  Denies any worsening shortness of breath.  No overnight fever or vomiting reported.  Objective: Vitals:   12/20/18 0044 12/20/18 0313 12/20/18 0429 12/20/18 0619  BP: (!) 132/52 132/60    Pulse: 77 80    Resp: 18 15    Temp: 98.6 F (37 C)  98.8 F (37.1 C)   TempSrc: Oral     SpO2: 91% 95%    Weight:    62.2 kg  Height:        Intake/Output Summary (Last 24 hours) at 12/20/2018 0739 Last data filed at 12/20/2018 0656 Gross per 24 hour  Intake 1121.73 ml  Output 1350 ml  Net -228.27 ml   Filed Weights   12/18/18 1100 12/20/18 0619  Weight: 75.4 kg 62.2 kg    Examination:  General exam: No acute  distress.  Elderly female lying in bed.  Poor historian. Respiratory system: Bilateral decreased breath sounds at bases with some basilar crackles.  No wheezing  cardiovascular system: Rate controlled, S1-S2 heard Gastrointestinal system: Abdomen is nondistended, soft and nontender. Normal bowel sounds heard. Extremities: No cyanosis; trace lower extremity edema Central nervous system: Awake but very slow to respond to questions.  No focal  neurological deficits. Moving extremities Skin: Diffuse nonblanching erythematous rash over the abdomen and lower extremities. Psychiatry: Could not be assessed because of mental status.     Data Reviewed: I have personally reviewed following labs and imaging studies  CBC: Recent Labs  Lab 12/18/18 1056 12/18/18 1251 12/19/18 0624 12/20/18 0522  WBC 8.7  --  11.6* 12.5*  NEUTROABS 5.6  --   --   --   HGB 7.9* 7.5* 10.0* 10.8*  HCT 24.9* 22.0* 30.7* 32.6*  MCV 105.1*  --  96.2 96.2  PLT 261  --  282 274   Basic Metabolic Panel: Recent Labs  Lab 12/18/18 1056 12/18/18 1251 12/19/18 0624 12/20/18 0522  NA 133* 133* 132* 131*  K 4.3 4.5 3.9 3.8  CL 102  --  100 101  CO2 22  --  22 20*  GLUCOSE 151*  --  98 107*  BUN 9  --  10 6*  CREATININE 0.85  --  0.75 0.78  CALCIUM 8.0*  --  8.2* 8.0*  MG  --   --   --  1.7   GFR: Estimated Creatinine Clearance: 43.3 mL/min (by C-G formula based on SCr of 0.78 mg/dL). Liver Function Tests: Recent Labs  Lab 12/18/18 1056 12/20/18 0522  AST 30 33  ALT 19 28  ALKPHOS 105 113  BILITOT 0.1* 0.5  PROT 4.6* 5.1*  ALBUMIN 1.6* 1.6*   No results for input(s): LIPASE, AMYLASE in the last 168 hours. No results for input(s): AMMONIA in the last 168 hours. Coagulation Profile: Recent Labs  Lab 12/18/18 1056  INR 1.2   Cardiac Enzymes: No results for input(s): CKTOTAL, CKMB, CKMBINDEX, TROPONINI in the last 168 hours. BNP (last 3 results) No results for input(s): PROBNP in the last 8760  hours. HbA1C: No results for input(s): HGBA1C in the last 72 hours. CBG: No results for input(s): GLUCAP in the last 168 hours. Lipid Profile: No results for input(s): CHOL, HDL, LDLCALC, TRIG, CHOLHDL, LDLDIRECT in the last 72 hours. Thyroid Function Tests: No results for input(s): TSH, T4TOTAL, FREET4, T3FREE, THYROIDAB in the last 72 hours. Anemia Panel: No results for input(s): VITAMINB12, FOLATE, FERRITIN, TIBC, IRON, RETICCTPCT in the last 72 hours. Sepsis Labs: Recent Labs  Lab 12/18/18 1056 12/18/18 1244  PROCALCITON 0.13  --   LATICACIDVEN 2.0* 1.4    Recent Results (from the past 240 hour(s))  Blood Culture (routine x 2)     Status: None (Preliminary result)   Collection Time: 12/18/18 10:47 AM   Specimen: BLOOD RIGHT ARM  Result Value Ref Range Status   Specimen Description BLOOD RIGHT ARM  Final   Special Requests   Final    BOTTLES DRAWN AEROBIC AND ANAEROBIC Blood Culture adequate volume   Culture   Final    NO GROWTH 1 DAY Performed at [facility], [address] [place], [place] [address]    Report Status PENDING  Incomplete  SARS Coronavirus 2 by RT PCR (hospital order, performed in [facility] hospital lab) Nasopharyngeal Nasopharyngeal Swab     Status: None   Collection Time: 12/18/18 12:13 PM   Specimen: Nasopharyngeal Swab  Result Value Ref Range Status   SARS Coronavirus 2 NEGATIVE NEGATIVE Final    Comment: (NOTE) If result is NEGATIVE SARS-CoV-2 target nucleic acids are NOT DETECTED. The SARS-CoV-2 RNA is generally detectable in upper and lower  respiratory specimens during the acute phase of infection. The lowest  concentration of SARS-CoV-2 viral copies this assay can detect is 250  copies / mL. A negative result does not preclude SARS-CoV-2 infection  and should not be used as the sole basis for treatment or other  patient management decisions.  A negative result may occur with  improper specimen collection / handling, submission of  specimen other  than nasopharyngeal swab, presence of viral mutation(s) within the  areas targeted by this assay, and inadequate number of viral copies  (<250 copies / mL). A negative result must be combined with clinical  observations, patient history, and epidemiological information. If result is POSITIVE SARS-CoV-2 target nucleic acids are DETECTED. The SARS-CoV-2 RNA is generally detectable in upper and lower  respiratory specimens dur ing the acute phase of infection.  Positive  results are indicative of active infection with SARS-CoV-2.  Clinical  correlation with patient history and other diagnostic information is  necessary to determine patient infection status.  Positive results do  not rule out bacterial infection or co-infection with other viruses. If result is PRESUMPTIVE POSTIVE SARS-CoV-2 nucleic acids MAY BE PRESENT.   A presumptive positive result was obtained  on the submitted specimen  and confirmed on repeat testing.  While 2019 novel coronavirus  (SARS-CoV-2) nucleic acids may be present in the submitted sample  additional confirmatory testing may be necessary for epidemiological  and / or clinical management purposes  to differentiate between  SARS-CoV-2 and other Sarbecovirus currently known to infect humans.  If clinically indicated additional testing with an alternate test  methodology [phone]) is advised. The SARS-CoV-2 RNA is generally  detectable in upper and lower respiratory sp ecimens during the acute  phase of infection. The expected result is Negative. Fact Sheet for Patients:  StrictlyIdeas.no Fact Sheet for Healthcare Providers: BankingDealers.co.za This test is not yet approved or cleared by the [place] FDA and has been authorized for detection and/or diagnosis of SARS-CoV-2 by FDA under an Emergency Use Authorization (EUA).  This EUA will remain in effect (meaning this test can be used) for the  duration of the COVID-19 declaration under Section 564(b)(1) of the Act, 21 U.S.C. section 360bbb-3(b)(1), unless the authorization is terminated or revoked sooner. Performed at [facility], [place] [address], [place], [place] [address]   Blood Culture (routine x 2)     Status: None (Preliminary result)   Collection Time: 12/18/18 12:31 PM   Specimen: BLOOD RIGHT HAND  Result Value Ref Range Status   Specimen Description BLOOD RIGHT HAND  Final   Special Requests   Final    BOTTLES DRAWN AEROBIC ONLY Blood Culture results may not be optimal due to an inadequate volume of blood received in culture bottles   Culture   Final    NO GROWTH < 24 HOURS Performed at [facility], [place] [address], [place], [place] [address]    Report Status PENDING  Incomplete  Urine culture     Status: Abnormal   Collection Time: 12/18/18  3:26 PM   Specimen: In/Out Cath Urine  Result Value Ref Range Status   Specimen Description IN/OUT CATH URINE  Final   Special Requests   Final    NONE Performed at [facility], [place] [address], [place], [place] [address]    Culture 20,000 COLONIES/mL YEAST (A)  Final   Report Status 12/19/2018 FINAL  Final  MRSA PCR Screening     Status: None   Collection Time: 12/20/18 12:50 AM   Specimen: Nasal Mucosa; Nasopharyngeal  Result Value Ref Range Status   MRSA by PCR NEGATIVE NEGATIVE Final    Comment:        The GeneXpert MRSA Assay (FDA approved for NASAL specimens only), is one component of a comprehensive MRSA colonization surveillance program. It is not intended to diagnose MRSA infection nor to guide or monitor treatment for MRSA infections. Performed at [facility], [place] [address] [place],  [address]          Radiology Studies: Ct Angio Head W Or Wo Contrast  Addendum Date: 12/19/2018   ADDENDUM REPORT: 12/19/2018 21:03 ADDENDUM: Critical Value/emergent results were called by telephone at the time of interpretation  on 12/19/2018 at 8:58 pm to provider Dr. Kennon Holter, Who verbally acknowledged these results. Electronically Signed   By: Jeannine Boga M.D.   On: 12/19/2018 21:03   Result Date: 12/19/2018 CLINICAL DATA:  Initial evaluation for acute stroke, altered mental status. EXAM: CT ANGIOGRAPHY HEAD AND NECK TECHNIQUE: Multidetector CT imaging of the head and neck was performed using the standard protocol during bolus administration of intravenous contrast. Multiplanar CT image reconstructions and MIPs were  obtained to evaluate the vascular anatomy. Carotid stenosis measurements (when applicable) are obtained utilizing NASCET criteria, using the distal internal carotid diameter as the denominator. CONTRAST:  75mL OMNIPAQUE IOHEXOL 350 MG/ML SOLN COMPARISON:  Prior MRI from earlier same day as well as recent CTA from 11/20/2018. FINDINGS: CT HEAD FINDINGS Brain: Age-related cerebral atrophy with chronic small vessel ischemic disease, stable. Remote left cerebellar infarct noted. Known small acute infarcts within the left frontal white matter, bilateral thalami, and right cerebellum are not well seen. No evidence for hemorrhagic transformation. No acute intracranial hemorrhage elsewhere. No other acute large vessel territory infarct. No mass lesion, midline shift or mass effect. No hydrocephalus. No extra-axial fluid collection. Vascular: No hyperdense vessel. Calcified atherosclerosis at the skull base. Skull: Scalp soft tissues and calvarium within normal limits. Calvarium intact. Sinuses: Paranasal sinuses are clear. Chronic right mastoid effusion, stable. Orbits: Globes normal soft tissues demonstrate no acute finding. Review of the MIP images confirms the above findings CTA NECK FINDINGS Aortic arch: Visualized aortic arch of normal caliber with normal 3 vessel morphology. Mild-to-moderate atherosclerotic change about the aortic arch and origin of the great vessels up to approximately 50% stenosis at the proximal left  subclavian artery again noted, stable. No other hemodynamically significant stenosis about the proximal great vessels. Visualized subclavian arteries otherwise widely patent. Right carotid system: Right CCA tortuous but widely patent to the bifurcation without stenosis. Mild a centric mixed plaque about the right bifurcation without hemodynamically significant stenosis. Right ICA widely patent distally to the skull base without stenosis, dissection or occlusion. Left carotid system: Left CCA tortuous but widely patent to the bifurcation without stenosis. No significant atheromatous narrowing of the left bifurcation. Left ICA tortuous with mild atheromatous irregularity but is widely patent without stenosis, dissection or occlusion. Vertebral arteries: Both vertebral arteries arise from the subclavian arteries. Left vertebral artery slightly dominant. Vertebral arteries patent within the neck without stenosis, dissection or occlusion. Skeleton: No acute osseous finding. No discrete lytic or blastic osseous lesions. Moderate to advanced cervical spondylosis at C3-4 through C6-7. Grade 1 facet mediated anterolisthesis of C7 on T1, stable. Other neck: No other acute soft tissue abnormality within the neck. Upper chest: Small focal filling defects seen within a left upper lobe segmental pulmonary artery, consistent with acute pulmonary embolism (series 9, image 160). No other visible pulmonary emboli. Moderate layering bilateral pleural effusions with associated atelectasis. Underlying mild pulmonary interstitial edema. Review of the MIP images confirms the above findings CTA HEAD FINDINGS Anterior circulation: Petrous segments widely patent. Calcified atheromatous plaque within the carotid siphons with no more than mild multifocal stenosis. ICA termini well perfused. A1 segments patent bilaterally. Normal anterior communicating artery complex. Anterior cerebral arteries widely patent to their distal aspects. No M1  stenosis or occlusion. Negative MCA bifurcations. Distal MCA branches well perfused and symmetric. Posterior circulation: Dominant left vertebral artery demonstrates scattered atheromatous irregularity but is patent to the vertebrobasilar junction without high-grade stenosis. Patent left PICA. Hypoplastic right vertebral artery largely terminates in PICA. Right PICA patent as well. Basilar artery diffusely diminutive but patent to its distal aspect, stable. Superior cerebral arteries patent bilaterally. Predominant fetal type origin of the PCAs. Right PCA patent to its distal aspect without stenosis. Short-segment severe proximal left P2 stenosis, unchanged. Venous sinuses: Grossly patent allowing for timing the contrast bolus. Anatomic variants: Predominant fetal type origin of the PCAs with overall diminutive vertebrobasilar system. Review of the MIP images confirms the above findings IMPRESSION: 1. Stable CTA of the  head and neck as compared to recent exam from 11/20/2018. No emergent large vessel occlusion. 2. Short-segment severe proximal left P2 stenosis, stable. 3. Predominant fetal type origin of the PCAs with overall diminutive vertebrobasilar system. 4. Focal filling defect within a left upper lobe segmental pulmonary artery, consistent with a small acute PE. 5. Moderate layering bilateral pleural effusions with associated atelectasis and pulmonary interstitial edema. Current attempt is being made to contact the covering clinician regarding these findings. Results will be conveyed as soon as possible. Electronically Signed: By: Rise Mu M.D. On: 12/19/2018 20:50   Ct Angio Neck W Or Wo Contrast  Addendum Date: 12/19/2018   ADDENDUM REPORT: 12/19/2018 21:03 ADDENDUM: Critical Value/emergent results were called by telephone at the time of interpretation on 12/19/2018 at 8:58 pm to provider Dr. Bruna Potter, Who verbally acknowledged these results. Electronically Signed   By: Rise Mu  M.D.   On: 12/19/2018 21:03   Result Date: 12/19/2018 CLINICAL DATA:  Initial evaluation for acute stroke, altered mental status. EXAM: CT ANGIOGRAPHY HEAD AND NECK TECHNIQUE: Multidetector CT imaging of the head and neck was performed using the standard protocol during bolus administration of intravenous contrast. Multiplanar CT image reconstructions and MIPs were obtained to evaluate the vascular anatomy. Carotid stenosis measurements (when applicable) are obtained utilizing NASCET criteria, using the distal internal carotid diameter as the denominator. CONTRAST:  75mL OMNIPAQUE IOHEXOL 350 MG/ML SOLN COMPARISON:  Prior MRI from earlier same day as well as recent CTA from 11/20/2018. FINDINGS: CT HEAD FINDINGS Brain: Age-related cerebral atrophy with chronic small vessel ischemic disease, stable. Remote left cerebellar infarct noted. Known small acute infarcts within the left frontal white matter, bilateral thalami, and right cerebellum are not well seen. No evidence for hemorrhagic transformation. No acute intracranial hemorrhage elsewhere. No other acute large vessel territory infarct. No mass lesion, midline shift or mass effect. No hydrocephalus. No extra-axial fluid collection. Vascular: No hyperdense vessel. Calcified atherosclerosis at the skull base. Skull: Scalp soft tissues and calvarium within normal limits. Calvarium intact. Sinuses: Paranasal sinuses are clear. Chronic right mastoid effusion, stable. Orbits: Globes normal soft tissues demonstrate no acute finding. Review of the MIP images confirms the above findings CTA NECK FINDINGS Aortic arch: Visualized aortic arch of normal caliber with normal 3 vessel morphology. Mild-to-moderate atherosclerotic change about the aortic arch and origin of the great vessels up to approximately 50% stenosis at the proximal left subclavian artery again noted, stable. No other hemodynamically significant stenosis about the proximal great vessels. Visualized  subclavian arteries otherwise widely patent. Right carotid system: Right CCA tortuous but widely patent to the bifurcation without stenosis. Mild a centric mixed plaque about the right bifurcation without hemodynamically significant stenosis. Right ICA widely patent distally to the skull base without stenosis, dissection or occlusion. Left carotid system: Left CCA tortuous but widely patent to the bifurcation without stenosis. No significant atheromatous narrowing of the left bifurcation. Left ICA tortuous with mild atheromatous irregularity but is widely patent without stenosis, dissection or occlusion. Vertebral arteries: Both vertebral arteries arise from the subclavian arteries. Left vertebral artery slightly dominant. Vertebral arteries patent within the neck without stenosis, dissection or occlusion. Skeleton: No acute osseous finding. No discrete lytic or blastic osseous lesions. Moderate to advanced cervical spondylosis at C3-4 through C6-7. Grade 1 facet mediated anterolisthesis of C7 on T1, stable. Other neck: No other acute soft tissue abnormality within the neck. Upper chest: Small focal filling defects seen within a left upper lobe segmental pulmonary artery, consistent with  acute pulmonary embolism (series 9, image 160). No other visible pulmonary emboli. Moderate layering bilateral pleural effusions with associated atelectasis. Underlying mild pulmonary interstitial edema. Review of the MIP images confirms the above findings CTA HEAD FINDINGS Anterior circulation: Petrous segments widely patent. Calcified atheromatous plaque within the carotid siphons with no more than mild multifocal stenosis. ICA termini well perfused. A1 segments patent bilaterally. Normal anterior communicating artery complex. Anterior cerebral arteries widely patent to their distal aspects. No M1 stenosis or occlusion. Negative MCA bifurcations. Distal MCA branches well perfused and symmetric. Posterior circulation: Dominant left  vertebral artery demonstrates scattered atheromatous irregularity but is patent to the vertebrobasilar junction without high-grade stenosis. Patent left PICA. Hypoplastic right vertebral artery largely terminates in PICA. Right PICA patent as well. Basilar artery diffusely diminutive but patent to its distal aspect, stable. Superior cerebral arteries patent bilaterally. Predominant fetal type origin of the PCAs. Right PCA patent to its distal aspect without stenosis. Short-segment severe proximal left P2 stenosis, unchanged. Venous sinuses: Grossly patent allowing for timing the contrast bolus. Anatomic variants: Predominant fetal type origin of the PCAs with overall diminutive vertebrobasilar system. Review of the MIP images confirms the above findings IMPRESSION: 1. Stable CTA of the head and neck as compared to recent exam from 11/20/2018. No emergent large vessel occlusion. 2. Short-segment severe proximal left P2 stenosis, stable. 3. Predominant fetal type origin of the PCAs with overall diminutive vertebrobasilar system. 4. Focal filling defect within a left upper lobe segmental pulmonary artery, consistent with a small acute PE. 5. Moderate layering bilateral pleural effusions with associated atelectasis and pulmonary interstitial edema. Current attempt is being made to contact the covering clinician regarding these findings. Results will be conveyed as soon as possible. Electronically Signed: By: Rise Mu M.D. On: 12/19/2018 20:50   Ct Angio Chest Pe W And/or Wo Contrast  Result Date: 12/18/2018 CLINICAL DATA:  Shortness of breath. EXAM: CT ANGIOGRAPHY CHEST WITH CONTRAST TECHNIQUE: Multidetector CT imaging of the chest was performed using the standard protocol during bolus administration of intravenous contrast. Multiplanar CT image reconstructions and MIPs were obtained to evaluate the vascular anatomy. CONTRAST:  80 mL OMNIPAQUE IOHEXOL 350 MG/ML SOLN COMPARISON:  11/17/2018 and 11/12/2018  FINDINGS: Cardiovascular: Heart is normal size. Small amount of pericardial fluid is present anteriorly. Thoracic aorta demonstrates no evidence of aneurysm or dissection. There is mild calcified plaque over the thoracic aorta. Pulmonary or system is well opacified demonstrates suggestion of small emboli over the proximal left upper lobar pulmonary artery. There is also suggestion of subtle emboli over a subsegmental right middle lobe pulmonary artery. RV/LV ratio 0.96. Remaining vascular structures are unremarkable. Mediastinum/Nodes: 1 cm precarinal lymph node is present. 1.3 cm subcarinal lymph node. No significant hilar adenopathy. Remaining mediastinal structures are unremarkable. Lungs/Pleura: Lungs are adequately inflated demonstrate small bilateral pleural effusions with associated posterior atelectatic change. Subtle patchy density over the right upper lobe with mild nodularity as this could represent infection infection. Airways are unremarkable. Upper Abdomen: No acute findings. Mild calcified plaque over the abdominal aorta. Musculoskeletal: Degenerative change of the spine. Review of the MIP images confirms the above findings. IMPRESSION: 1. Findings suggesting subtle emboli over the proximal left upper lobe pulmonary artery and subsegmental right middle lobar pulmonary artery. RV/LV ratio slightly elevated at 0.96 indicating mild right heart strain. 2. Small bilateral pleural effusions with associated atelectasis. Mild patchy post density over the right upper lobe with mild nodularity which may be due to infection. Minimal mediastinal adenopathy likely  reactive. Recommend follow-up CT 4-6 weeks. 3.  Aortic Atherosclerosis (ICD10-I70.0). Critical Value/emergent results were called by telephone at the time of interpretation on 12/18/2018 at 2:46 pm to providerBOWIE TRAN , who verbally acknowledged these results. Electronically Signed   By: Elberta Fortis M.D.   On: 12/18/2018 14:50   Mr Brain Wo  Contrast  Result Date: 12/19/2018 CLINICAL DATA:  Altered mental status unclear etiology EXAM: MRI HEAD WITHOUT CONTRAST TECHNIQUE: Multiplanar, multiecho pulse sequences of the brain and surrounding structures were obtained without intravenous contrast. COMPARISON:  MRI head 11/20/2018 FINDINGS: Brain: Small areas of acute infarct in the thalamus bilaterally have developed since the prior study. Small acute infarct left frontal white matter. Small acute infarct right cerebellum also new. Generalized atrophy with chronic microvascular ischemic change in the white matter. Chronic microhemorrhage left cerebellar tonsil and left thalamus. Negative for mass or midline shift. Vascular: Normal arterial flow voids. Skull and upper cervical spine: No acute skeletal abnormality. C1-2 arthropathy with pannus posterior to the dens. No significant spinal stenosis. Mild anterolisthesis C2-3. Sinuses/Orbits: Paranasal sinuses clear. Right mastoid effusion. No orbital lesion. Other: None IMPRESSION: Compared with the recent MRI of 11/20/2018, new areas of acute infarct are noted in the thalamus bilaterally, left frontal white matter, and right lateral cerebellum. Electronically Signed   By: Marlan Palau M.D.   On: 12/19/2018 13:08   Mr Thoracic Spine Wo Contrast  Result Date: 12/19/2018 CLINICAL DATA:  Back pain.  Endocarditis. EXAM: MRI THORACIC AND LUMBAR SPINE WITHOUT CONTRAST TECHNIQUE: Multiplanar and multiecho pulse sequences of the thoracic and lumbar spine were obtained without intravenous contrast. COMPARISON:  MRI thoracic and lumbar spine 11/18/2018 FINDINGS: MRI THORACIC SPINE FINDINGS Alignment: Normal alignment. Extension weighted dorsal kyphosis T7-8. Vertebrae: Negative for fracture or mass. Scattered hemangiomata. Discogenic changes in the midthoracic spine Cord: Spinal cord signal normal throughout the thoracic spine. Cord flattening at T7-8 due to disc protrusion Paraspinal and other soft tissues: Small  bilateral pleural effusions. No paraspinous mass. Disc levels: T2-3: Broad-based central disc protrusion without significant stenosis T4-5: Small right-sided disc protrusion T6-7: Moderate disc degeneration. Small left-sided disc protrusion with mild cord flattening on the left T7-8: Moderately large central disc protrusion with cord flattening unchanged from the prior study. T8-9: Small right-sided disc protrusion T9-10: Disc degeneration with right-sided spurring. Mild right foraminal stenosis. T11-12: Small left-sided disc protrusion. The patient was not able to tolerate postcontrast imaging of the thoracic or lumbar spine. MRI LUMBAR SPINE FINDINGS Segmentation:  Normal Alignment: Lumbar dextroscoliosis. Mild retrolisthesis L1-2 and L2-3 Vertebrae: Negative for fracture. New endplate edema at Z6-1 with associated fluid signal in the disc space. Findings are suggestive of discitis osteomyelitis given the history of endocarditis and interval development since 1 month ago. Conus medullaris and cauda equina: Conus extends to the L2 level. Conus and cauda equina appear normal. Paraspinal and other soft tissues: New fluid collection right psoas muscle at the L1-2 level measuring 10 x 15 mm. New 6 mm fluid collection left upper psoas muscle at the L2 level. Probable abscesses given the other findings. Disc levels: T12-L1: Negative L1-2: Fluid in the disc space with new endplate edema. Fluid in the disc space projects into the left epidural fluid collection most compatible with small epidural abscess. This is new. Diffuse endplate spurring with foraminal narrowing bilaterally due to spurring. L2-3: Disc degeneration and spurring. Bilateral facet degeneration. Severe subarticular stenosis on the left and mild spinal stenosis. Mild subarticular stenosis on the right L3-4: Moderate spinal stenosis  and moderate subarticular and foraminal stenosis left greater than right. Disc degeneration and spurring with moderate facet  hypertrophy bilaterally L4-5: Disc degeneration with diffuse endplate spurring and bilateral facet hypertrophy. Mild spinal stenosis. Moderate subarticular bilaterally. L5-S1: Disc degeneration with endplate spurring. Bilateral facet hypertrophy. Moderate right foraminal encroachment unchanged. IMPRESSION: MR THORACIC SPINE IMPRESSION Multilevel degenerative change throughout the thoracic spine. No evidence of discitis osteomyelitis or fracture in the thoracic spine. Small bilateral pleural effusions. MR LUMBAR SPINE IMPRESSION Interval development of endplate edema at Z6-1. This is most consistent with discitis and osteomyelitis. Small left-sided epidural abscess. Bilateral small psoas abscesses have developed since the prior MRI. Patient was not able to tolerate postcontrast imaging. Electronically Signed   By: Marlan Palau M.D.   On: 12/19/2018 13:45   Mr Lumbar Spine Wo Contrast  Result Date: 12/19/2018 CLINICAL DATA:  Back pain.  Endocarditis. EXAM: MRI THORACIC AND LUMBAR SPINE WITHOUT CONTRAST TECHNIQUE: Multiplanar and multiecho pulse sequences of the thoracic and lumbar spine were obtained without intravenous contrast. COMPARISON:  MRI thoracic and lumbar spine 11/18/2018 FINDINGS: MRI THORACIC SPINE FINDINGS Alignment: Normal alignment. Extension weighted dorsal kyphosis T7-8. Vertebrae: Negative for fracture or mass. Scattered hemangiomata. Discogenic changes in the midthoracic spine Cord: Spinal cord signal normal throughout the thoracic spine. Cord flattening at T7-8 due to disc protrusion Paraspinal and other soft tissues: Small bilateral pleural effusions. No paraspinous mass. Disc levels: T2-3: Broad-based central disc protrusion without significant stenosis T4-5: Small right-sided disc protrusion T6-7: Moderate disc degeneration. Small left-sided disc protrusion with mild cord flattening on the left T7-8: Moderately large central disc protrusion with cord flattening unchanged from the prior  study. T8-9: Small right-sided disc protrusion T9-10: Disc degeneration with right-sided spurring. Mild right foraminal stenosis. T11-12: Small left-sided disc protrusion. The patient was not able to tolerate postcontrast imaging of the thoracic or lumbar spine. MRI LUMBAR SPINE FINDINGS Segmentation:  Normal Alignment: Lumbar dextroscoliosis. Mild retrolisthesis L1-2 and L2-3 Vertebrae: Negative for fracture. New endplate edema at W9-6 with associated fluid signal in the disc space. Findings are suggestive of discitis osteomyelitis given the history of endocarditis and interval development since 1 month ago. Conus medullaris and cauda equina: Conus extends to the L2 level. Conus and cauda equina appear normal. Paraspinal and other soft tissues: New fluid collection right psoas muscle at the L1-2 level measuring 10 x 15 mm. New 6 mm fluid collection left upper psoas muscle at the L2 level. Probable abscesses given the other findings. Disc levels: T12-L1: Negative L1-2: Fluid in the disc space with new endplate edema. Fluid in the disc space projects into the left epidural fluid collection most compatible with small epidural abscess. This is new. Diffuse endplate spurring with foraminal narrowing bilaterally due to spurring. L2-3: Disc degeneration and spurring. Bilateral facet degeneration. Severe subarticular stenosis on the left and mild spinal stenosis. Mild subarticular stenosis on the right L3-4: Moderate spinal stenosis and moderate subarticular and foraminal stenosis left greater than right. Disc degeneration and spurring with moderate facet hypertrophy bilaterally L4-5: Disc degeneration with diffuse endplate spurring and bilateral facet hypertrophy. Mild spinal stenosis. Moderate subarticular bilaterally. L5-S1: Disc degeneration with endplate spurring. Bilateral facet hypertrophy. Moderate right foraminal encroachment unchanged. IMPRESSION: MR THORACIC SPINE IMPRESSION Multilevel degenerative change  throughout the thoracic spine. No evidence of discitis osteomyelitis or fracture in the thoracic spine. Small bilateral pleural effusions. MR LUMBAR SPINE IMPRESSION Interval development of endplate edema at E4-5. This is most consistent with discitis and osteomyelitis. Small left-sided epidural abscess.  Bilateral small psoas abscesses have developed since the prior MRI. Patient was not able to tolerate postcontrast imaging. Electronically Signed   By: Marlan Palau M.D.   On: 12/19/2018 13:45   Dg Chest Port 1 View  Result Date: 12/18/2018 CLINICAL DATA:  85 year old female with shortness of breath and cough. EXAM: PORTABLE CHEST 1 VIEW COMPARISON:  Chest CT dated 11/17/2018 and radiograph dated 11/12/2018 FINDINGS: Right-sided PICC with tip at the cavoatrial junction. Left lung base density, likely combination of trace pleural effusion and subsegmental atelectasis/scarring the right lung is clear. There is no pneumothorax. Stable cardiomediastinal silhouette. The aorta is tortuous. There is atherosclerotic calcification of the aorta. No acute osseous pathology. Developing infiltrate is not excluded. Clinical correlation is recommended. IMPRESSION: 1. Right-sided PICC with tip at the cavoatrial junction. No pneumothorax. 2. Left lung base density, likely combination of trace pleural effusion and atelectasis/scarring. Developing infiltrate is not excluded. Electronically Signed   By: Elgie Collard M.D.   On: 12/18/2018 11:57   Vas [place] Lower Extremity Venous (dvt)  Result Date: 12/19/2018  Lower Venous Study Indications: Pulmonary embolism.  Comparison Study: no current prior Performing Technologist: Blanch Media RVS  Examination Guidelines: A complete evaluation includes B-mode imaging, spectral Doppler, color Doppler, and power Doppler as needed of all accessible portions of each vessel. Bilateral testing is considered an integral part of a complete examination. Limited examinations for reoccurring  indications may be performed as noted.  +---------+---------------+---------+-----------+----------+--------------+  RIGHT     Compressibility Phasicity Spontaneity Properties Thrombus Aging  +---------+---------------+---------+-----------+----------+--------------+  CFV       Full            Yes       Yes                                    +---------+---------------+---------+-----------+----------+--------------+  SFJ       Full                                                             +---------+---------------+---------+-----------+----------+--------------+  FV Prox   Full                                                             +---------+---------------+---------+-----------+----------+--------------+  FV Mid    Full                                                             +---------+---------------+---------+-----------+----------+--------------+  FV Distal Full                                                             +---------+---------------+---------+-----------+----------+--------------+  PFV  Full                                                             +---------+---------------+---------+-----------+----------+--------------+  POP       Full            Yes       Yes                                    +---------+---------------+---------+-----------+----------+--------------+  PTV       Full                                                             +---------+---------------+---------+-----------+----------+--------------+  PERO                                                       Not visualized  +---------+---------------+---------+-----------+----------+--------------+   +---------+---------------+---------+-----------+----------+--------------+  LEFT      Compressibility Phasicity Spontaneity Properties Thrombus Aging  +---------+---------------+---------+-----------+----------+--------------+  CFV       Full            Yes       Yes                                     +---------+---------------+---------+-----------+----------+--------------+  SFJ       Full                                                             +---------+---------------+---------+-----------+----------+--------------+  FV Prox   Full                                                             +---------+---------------+---------+-----------+----------+--------------+  FV Mid    Full                                                             +---------+---------------+---------+-----------+----------+--------------+  FV Distal Full                                                             +---------+---------------+---------+-----------+----------+--------------+  PFV       Full                                                             +---------+---------------+---------+-----------+----------+--------------+  POP       Full            Yes       Yes                                    +---------+---------------+---------+-----------+----------+--------------+  PTV       Full                                                             +---------+---------------+---------+-----------+----------+--------------+  PERO      Full                                                             +---------+---------------+---------+-----------+----------+--------------+     Summary: Right: There is no evidence of deep vein thrombosis in the lower extremity. No cystic structure found in the popliteal fossa. Left: There is no evidence of deep vein thrombosis in the lower extremity. No cystic structure found in the popliteal fossa.  *See table(s) above for measurements and observations. Electronically signed by Sherald Hess MD on 12/19/2018 at 3:32:30 PM.    Final         Scheduled Meds:  sodium chloride flush  3 mL Intravenous Q12H   Continuous Infusions:  ceFEPime (MAXIPIME) IV 2 g (12/20/18 0148)   heparin 950 Units/hr (12/19/18 1916)   vancomycin Stopped (12/19/18 1433)           Glade Lloyd, MD [facility] 12/20/2018, 7:39 AM

## 2018-12-20 NOTE — Progress Notes (Addendum)
Initial Nutrition Assessment  RD working remotely.  DOCUMENTATION CODES:   Not applicable  INTERVENTION:   -Once diet is advanced, add:   -Ensure Enlive po TID, each supplement provides 350 kcal and 20 grams of protein -MVI with minerals daily -Feeding assistance with meals   NUTRITION DIAGNOSIS:   Inadequate oral intake related to decreased appetite as evidenced by per patient/family report.  GOAL:   Patient will meet greater than or equal to 90% of their needs  MONITOR:   PO intake, Supplement acceptance, Diet advancement, Labs, Weight trends, Skin, I & O's  REASON FOR ASSESSMENT:   Consult Assessment of nutrition requirement/status  ASSESSMENT:   Autumn [name] is a 85 y.o. female with medical history significant of hypertension, systolic CHF, hypothyroidism, left bundle branch block, and peripheral neuropathy presenting with complaints of fever and altered mental status. History from the patient is limited due to her current state.  Pt admitted with sepsis secondary to possible HCAP with concern for gram-negative rods versus MRSA pneumonia.   11/5- s/p BSE- advanced to regular diet with thin liquids  Reviewed I/O's: -228 ml x 24 hours and +587 ml since admission  UOP: 1.4 L x 24 hours  Pt residing at [facility].   Per neurology notes, MRI brain was done and it revealed new areas of acute infarct in the thalamus bilaterally, left frontal white matter and right lateral cerebellum.    Attempted to speak with pt via phone, however, no answer. Unable to obtain further nutrition-related history from pt at this time. Pt was recently discharged from hospital in mid October 2020 and had history of decreased appetite. Pt had been accepting of Ensure supplements in the past.   Reviewed wt hx; noted pt has experienced a 8.5% wt loss over the past 3 months, which is significant for time frame. Pt is at high risk for malnutrition given advanced age, multiple  co-morbidities, weight loss, and history of poor oral intake.   Albumin has a half-life of 21 days and is strongly affected by stress response and inflammatory process, therefore, do not expect to see an improvement in this lab value during acute hospitalization. When a patient presents with low albumin, it is likely skewed due to the acute inflammatory response. Note that low albumin is no longer used to diagnose malnutrition; [facility] uses the new malnutrition guidelines published by the American Society for Parenteral and Enteral Nutrition (A.S.P.E.N.) and the Academy of Nutrition and Dietetics (AND).    Labs reviewed: Na: 131.   Diet Order:   Diet Order            Diet NPO time specified Except for: Sips with Meds  Diet effective midnight              EDUCATION NEEDS:   No education needs have been identified at this time  Skin:  Skin Assessment: Skin Integrity Issues: Skin Integrity Issues:: DTI DTI: rt heel  Last BM:  Unknown  Height:   Ht Readings from Last 1 Encounters:  12/18/18 5\' 3"  (1.6 m)    Weight:   Wt Readings from Last 1 Encounters:  12/20/18 62.2 kg    Ideal Body Weight:  52.3 kg  BMI:  Body mass index is 24.29 kg/m.  Estimated Nutritional Needs:   Kcal:  1650-1850  Protein:  80-95 grams  Fluid:  > 1.6 L    Trip Cavanagh A. Jimmye Norman, RD, LDN, [place] Registered Dietitian II Certified Diabetes Care and Education Specialist Pager:  [phone] After hours Pager: [phone]

## 2018-12-20 NOTE — Consult Note (Signed)
[facility] for Infectious Disease    Date of Admission:  12/18/2018     Total days of antibiotics 3  Received 30 ceftriaxone BID   Day 3 vanco + cefepime               Reason for Consult: Discitis, MSSA MV endocarditis history     Referring Provider: Starla Link Primary Care Provider:   Assessment: Autumn [name] is a 85 y.o. 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 One month into treatment with twice a day ceftriaxone when she had fevers at [facility] to 102 F. Re-evaluation in ER reveals L1-2 discitis with small epidural abscess and surrounding small psoas muscle abscesses. She has had terrible back pain throughout the course of treatment. Brain MRI also revealing she has had another acute stroke.   TTE this admission did not reveal any large vegetations however it was poor image quality. Neurology recommending TEE for more clear eval of stroke work up; would be useful to re-eval her MV to assess for ongoing CVA risk as it relates to endocarditis.   For her discitis with abscesses we suspect that this is evolution of MSSA infection; will plan on extending out IV antibiotics for 6 weeks. Check CRP/ESR for montioring. Her kidney function is improved compared to last admission; will trial her on Nafcillin to attempt ongoing CNS coverage presuming her second acute CVA is related to MV infection. For kidney protection will start 250cc Q12h NS. Understanding she fluid overloaded and needs diuresis, this may not be a regimen she can tolerate. Alternative would be to keep her on vancomycin or cefepime.     Plan: 1. Change IV antibiotics to nafcillin 2. 250cc NS boluses Q12h to help reduce interstitial nephritis risk 3. Would try to get her a back brace for support to help with pain  4. Agree with Palliative Medicine consult given complex medical problems 5. Hold on PICC replacement until blood cultures are negative x 72 hours.   6. CRP / ESR in AM    Principal Problem:   Sepsis ([place]) Active Problems:   MSSA bacteremia   Acute on chronic combined systolic and diastolic CHF (congestive heart failure) (HCC)   HCAP (healthcare-associated pneumonia)   Acute metabolic encephalopathy   Macrocytic anemia   Acute respiratory failure with hypoxia (HCC)   Pressure injury of skin   . furosemide  60 mg Intravenous Once  . sodium chloride flush  3 mL Intravenous Q12H    HPI: Autumn [name] is a 85 y.o. female with pmhx including anxiety, GERD, HTN, hypothyroidism, LBBB, peripheral neuropathy, venous insufficiency.   Recently admitted to [facility] 9/29 - 10/13 for nausea and vomiging from home. Her course was complicated by fever and bacteremia with MSSA after a week into her hospital stay. TEE obtained on 10/7 for MSSAB work up revealed echodensity on posterior MV suspicious for vegetation; also complicated by acute left sided cerebellar stroke. She was treated with ceftriaxone BID for CNS penetration due to worsening AKI and concern for interstitial nephritis 2/2 nafcillin. She was seen at that time by CT surgery and not a candidate given co morbidities and recent strokes. Prior to hospital discharge she developed severe acute lumbar spine back pain - thoracolumbar and sacral MRI done revealing no infectious etiology at that time and only severe stenosis L3-4 with disc bulge at T7-8. Also discovered her LVEF to be significantly  depressed to 25% (35% in 2016) with diffuse hypokinesis and LBBB associated dyssynchrony - she was seen by cards and declined an aggressive work up.   Readmitted 11/04 from [facility] for evaluation of altered mental status, fever and hypoxia. She has had a chronic indwelling foley catheter and PICC line. Her PICC line per her son was accidentally dislodged on Sunday; she was taken to have it replaced and her son states that ever since that time At [facility] on the day of ED evaluation she was febrile to  102.4 F and hypoxic with oxygen sats in the 80s.  In the ER she was tachypneic, normotensive, hypoxic on room air in the upper 70s, no leukocytosis, positive D-dimer 3.17 and anemia Hgb 7.9. Chest imaging revealed possible RML opacity concerning for PNA as well as a subtle PE in the R and L lungs with mild RV strain.  There was also som erythema described around her PICC line with swelling. PICC line was removed in the ED, blood cultures taken and her antibiotics were broadened to Vancomycin + Cefepime.  MRI of her brain revealed new areas of acute infarction in the thalamus b/l.  Blood cultures thusfar from admission remain negative.   She does not recall much to add to the above history that was taken from the chart. Her son is at the bedside and is understandably very worried about the overall prognosis for her and was surprised at the recommendation to meet with palliative care medicine team. He adds also that he wishes she could have some changes to her physical therapy plan as the intensity was a bit too much for her at [facility].    Review of Systems: Review of Systems  Unable to perform ROS: Mental status change    Past Medical History:  Diagnosis Date  . Anxiety   . GERD (gastroesophageal reflux disease)   . Hypertension   . Hypothyroid   . LBBB (left bundle branch block)   . MRSA bacteremia 11/19/2018  . Osteopenia   . Peripheral neuropathy   . Sepsis ([place]) 12/2018  . Venous insufficiency     Social History   Tobacco Use  . Smoking status: Never Smoker  . Smokeless tobacco: Never Used  Substance Use Topics  . Alcohol use: Yes    Frequency: Never    Comment: Once every three months   . Drug use: No    Family History  Problem Relation Age of Onset  . Hypertension Mother   . Prostate cancer Father    Allergies  Allergen Reactions  . Chlorhexidine   . Hydrochlorothiazide Other (See Comments)    REACTION: hyponatremia  . Ramipril Other (See Comments)     REACTION: GI upset  . Remeron [Mirtazapine] Other (See Comments)    REACTION: weakness  . Sulfa Antibiotics Rash    OBJECTIVE: Blood pressure (!) 145/67, pulse 81, temperature (!) 97.4 F (36.3 C), temperature source Oral, resp. rate (!) 23, height _0  (1.6 m), weight 62.2 kg, SpO2 97 %.  Physical Exam Vitals signs and nursing note reviewed.  Constitutional:      General: She is not in acute distress.    Appearance: She is ill-appearing.  HENT:     Mouth/Throat:     Mouth: Mucous membranes are dry.     Pharynx: No oropharyngeal exudate.  Eyes:     General: No scleral icterus.    Pupils: Pupils are equal, round, and reactive to light.  Cardiovascular:  Rate and Rhythm: Normal rate and regular rhythm.     Heart sounds: No murmur (difficult to appreciate with her talking ).  Pulmonary:     Effort: Pulmonary effort is normal.     Breath sounds: Normal breath sounds.  Abdominal:     General: Abdomen is flat.  Skin:    Capillary Refill: Capillary refill takes less than 2 seconds.     Comments: Generalized swelling but worse in the left UE. Also noted to have a generalized erythematous appearance of her skin on the arms and her back. No itching or formed lesions. Blanches.   Neurological:     Mental Status: She is alert.     Lab Results Lab Results  Component Value Date   WBC 12.5 (H) 12/20/2018   HGB 10.8 (L) 12/20/2018   HCT 32.6 (L) 12/20/2018   MCV 96.2 12/20/2018   PLT 274 12/20/2018    Lab Results  Component Value Date   CREATININE 0.78 12/20/2018   BUN 6 (L) 12/20/2018   NA 131 (L) 12/20/2018   K 3.8 12/20/2018   CL 101 12/20/2018   CO2 20 (L) 12/20/2018    Lab Results  Component Value Date   ALT 28 12/20/2018   AST 33 12/20/2018   ALKPHOS 113 12/20/2018   BILITOT 0.5 12/20/2018     Microbiology: Recent Results (from the past 240 hour(s))  Blood Culture (routine x 2)     Status: None (Preliminary result)   Collection Time: 12/18/18 10:47 AM    Specimen: BLOOD RIGHT ARM  Result Value Ref Range Status   Specimen Description BLOOD RIGHT ARM  Final   Special Requests   Final    BOTTLES DRAWN AEROBIC AND ANAEROBIC Blood Culture adequate volume   Culture   Final    NO GROWTH 1 DAY Performed at [facility], [place] [address] [place], [place] [address]    Report Status PENDING  Incomplete  SARS Coronavirus 2 by RT PCR (hospital order, performed in [facility] hospital lab) Nasopharyngeal Nasopharyngeal Swab     Status: None   Collection Time: 12/18/18 12:13 PM   Specimen: Nasopharyngeal Swab  Result Value Ref Range Status   SARS Coronavirus 2 NEGATIVE NEGATIVE Final    Comment: (NOTE) If result is NEGATIVE SARS-CoV-2 target nucleic acids are NOT DETECTED. The SARS-CoV-2 RNA is generally detectable in upper and lower  respiratory specimens during the acute phase of infection. The lowest  concentration of SARS-CoV-2 viral copies this assay can detect is 250  copies / mL. A negative result does not preclude SARS-CoV-2 infection  and should not be used as the sole basis for treatment or other  patient management decisions.  A negative result may occur with  improper specimen collection / handling, submission of specimen other  than nasopharyngeal swab, presence of viral mutation(s) within the  areas targeted by this assay, and inadequate number of viral copies  (<250 copies / mL). A negative result must be combined with clinical  observations, patient history, and epidemiological information. If result is POSITIVE SARS-CoV-2 target nucleic acids are DETECTED. The SARS-CoV-2 RNA is generally detectable in upper and lower  respiratory specimens dur ing the acute phase of infection.  Positive  results are indicative of active infection with SARS-CoV-2.  Clinical  correlation with patient history and other diagnostic information is  necessary to determine patient infection status.  Positive results do  not rule out bacterial  infection or co-infection with other viruses. If result  is PRESUMPTIVE POSTIVE SARS-CoV-2 nucleic acids MAY BE PRESENT.   A presumptive positive result was obtained on the submitted specimen  and confirmed on repeat testing.  While 2019 novel coronavirus  (SARS-CoV-2) nucleic acids may be present in the submitted sample  additional confirmatory testing may be necessary for epidemiological  and / or clinical management purposes  to differentiate between  SARS-CoV-2 and other Sarbecovirus currently known to infect humans.  If clinically indicated additional testing with an alternate test  methodology [phone]) is advised. The SARS-CoV-2 RNA is generally  detectable in upper and lower respiratory sp ecimens during the acute  phase of infection. The expected result is Negative. Fact Sheet for Patients:  StrictlyIdeas.no Fact Sheet for Healthcare Providers: BankingDealers.co.za This test is not yet approved or cleared by the [place] FDA and has been authorized for detection and/or diagnosis of SARS-CoV-2 by FDA under an Emergency Use Authorization (EUA).  This EUA will remain in effect (meaning this test can be used) for the duration of the COVID-19 declaration under Section 564(b)(1) of the Act, 21 U.S.C. section 360bbb-3(b)(1), unless the authorization is terminated or revoked sooner. Performed at [facility], [place] [address], [place], [place] [address]   Blood Culture (routine x 2)     Status: None (Preliminary result)   Collection Time: 12/18/18 12:31 PM   Specimen: BLOOD RIGHT HAND  Result Value Ref Range Status   Specimen Description BLOOD RIGHT HAND  Final   Special Requests   Final    BOTTLES DRAWN AEROBIC ONLY Blood Culture results may not be optimal due to an inadequate volume of blood received in culture bottles   Culture   Final    NO GROWTH < 24 HOURS Performed at [facility], [place] [address]  [place], [place] [address]    Report Status PENDING  Incomplete  Urine culture     Status: Abnormal   Collection Time: 12/18/18  3:26 PM   Specimen: In/Out Cath Urine  Result Value Ref Range Status   Specimen Description IN/OUT CATH URINE  Final   Special Requests   Final    NONE Performed at [facility], [place] [address] [place], [place] [address]    Culture 20,000 COLONIES/mL YEAST (A)  Final   Report Status 12/19/2018 FINAL  Final  MRSA PCR Screening     Status: None   Collection Time: 12/20/18 12:50 AM   Specimen: Nasal Mucosa; Nasopharyngeal  Result Value Ref Range Status   MRSA by PCR NEGATIVE NEGATIVE Final    Comment:        The GeneXpert MRSA Assay (FDA approved for NASAL specimens only), is one component of a comprehensive MRSA colonization surveillance program. It is not intended to diagnose MRSA infection nor to guide or monitor treatment for MRSA infections. Performed at [facility], [place] [address], [place], [place] [address]      Janene Madeira, MSN, NP-C [facility] for Infectious Disease [email or web address] Pager: [phone] Office: [phone] [place]: [phone]

## 2018-12-20 NOTE — Progress Notes (Signed)
[facility] for Heparin Indication: pulmonary embolus  Allergies  Allergen Reactions  . Chlorhexidine   . Hydrochlorothiazide Other (See Comments)    REACTION: hyponatremia  . Ramipril Other (See Comments)    REACTION: GI upset  . Remeron [Mirtazapine] Other (See Comments)    REACTION: weakness  . Sulfa Antibiotics Rash    Patient Measurements: Height: 5\' 3"  (160 cm) Weight: 166 lb 3.6 oz (75.4 kg)(per last visit) IBW/kg (Calculated) : 52.4 Heparin Dosing Weight: 68.5 kg  Vital Signs: Temp: 98.1 F (36.7 C) (11/05 2024) Temp Source: Oral (11/05 1444) BP: 117/56 (11/05 2245) Pulse Rate: 78 (11/05 2245)  Labs: Recent Labs    12/18/18 1056 12/18/18 1215 12/18/18 1251 12/18/18 1627  12/19/18 0500 12/19/18 0624 12/19/18 1432 12/19/18 2318  HGB 7.9*  --  7.5*  --   --   --  10.0*  --   --   HCT 24.9*  --  22.0*  --   --   --  30.7*  --   --   PLT 261  --   --   --   --   --  282  --   --   APTT 41*  --   --   --   --   --   --   --   --   LABPROT 15.1  --   --   --   --   --   --   --   --   INR 1.2  --   --   --   --   --   --   --   --   HEPARINUNFRC  --   --   --   --    < > 0.88*  --  0.69 0.44  CREATININE 0.85  --   --   --   --   --  0.75  --   --   TROPONINIHS  --  8  --  7  --   --   --   --   --    < > = values in this interval not displayed.    Estimated Creatinine Clearance: 50.9 mL/min (by C-G formula based on SCr of 0.75 mg/dL).  Assessment: 85 y.o. female with PE for heparin  11/6 AM update:  Heparin level therapeutic x 2 after rate decrease  Goal of Therapy:  Heparin level 0.3-0.7 units/ml Monitor platelets by anticoagulation protocol: Yes   Plan:  Continue Heparin at 950 units/hr Daily CBC/HL Monitor for bleeding   Narda Bonds, PharmD, BCPS Clinical Pharmacist Phone: [phone]

## 2018-12-20 NOTE — Care Management Important Message (Signed)
Important Message  Patient Details  Name: Autumn [name] MRN: [id number] Date of Birth: 1932-09-25   Medicare Important Message Given:  Yes     [name] 12/20/2018, 4:18 PM

## 2018-12-20 NOTE — Progress Notes (Signed)
STROKE TEAM PROGRESS NOTE   INTERVAL HISTORY Pt lying in bed, awake alert and orientated. She said she did not get much sleep last night and will take a nap after I leave. She has b/l LE weakness due to back pain. She also has LUE ataxia. On antibiotics. Fever resolved.   Vitals:   12/20/18 0313 12/20/18 0429 12/20/18 0619 12/20/18 0814  BP: 132/60   (!) 145/67  Pulse: 80   81  Resp: 15   (!) 23  Temp:  98.8 F (37.1 C)  (!) 97.4 F (36.3 C)  TempSrc:    Oral  SpO2: 95%   97%  Weight:   62.2 kg   Height:        CBC:  Recent Labs  Lab 12/18/18 1056  12/19/18 0624 12/20/18 0522  WBC 8.7  --  11.6* 12.5*  NEUTROABS 5.6  --   --   --   HGB 7.9*   < > 10.0* 10.8*  HCT 24.9*   < > 30.7* 32.6*  MCV 105.1*  --  96.2 96.2  PLT 261  --  282 274   < > = values in this interval not displayed.    Basic Metabolic Panel:  Recent Labs  Lab 12/19/18 0624 12/20/18 0522  NA 132* 131*  K 3.9 3.8  CL 100 101  CO2 22 20*  GLUCOSE 98 107*  BUN 10 6*  CREATININE 0.75 0.78  CALCIUM 8.2* 8.0*  MG  --  1.7   Lipid Panel:     Component Value Date/Time   CHOL 178 11/19/2018 1609   TRIG 114 11/19/2018 1609   HDL 66 11/19/2018 1609   CHOLHDL 2.7 11/19/2018 1609   VLDL 23 11/19/2018 1609   LDLCALC 89 11/19/2018 1609   HgbA1c:  Lab Results  Component Value Date   HGBA1C 5.9 (H) 11/19/2018   Urine Drug Screen: No results found for: LABOPIA, COCAINSCRNUR, LABBENZ, AMPHETMU, THCU, LABBARB  Alcohol Level No results found for: ETH  IMAGING Ct Angio Head W Or Wo Contrast  Addendum Date: 12/19/2018   ADDENDUM REPORT: 12/19/2018 21:03 ADDENDUM: Critical Value/emergent results were called by telephone at the time of interpretation on 12/19/2018 at 8:58 pm to provider Dr. Kennon Holter, Who verbally acknowledged these results. Electronically Signed   By: Jeannine Boga M.D.   On: 12/19/2018 21:03   Result Date: 12/19/2018 CLINICAL DATA:  Initial evaluation for acute stroke, altered mental  status. EXAM: CT ANGIOGRAPHY HEAD AND NECK TECHNIQUE: Multidetector CT imaging of the head and neck was performed using the standard protocol during bolus administration of intravenous contrast. Multiplanar CT image reconstructions and MIPs were obtained to evaluate the vascular anatomy. Carotid stenosis measurements (when applicable) are obtained utilizing NASCET criteria, using the distal internal carotid diameter as the denominator. CONTRAST:  109mL OMNIPAQUE IOHEXOL 350 MG/ML SOLN COMPARISON:  Prior MRI from earlier same day as well as recent CTA from 11/20/2018. FINDINGS: CT HEAD FINDINGS Brain: Age-related cerebral atrophy with chronic small vessel ischemic disease, stable. Remote left cerebellar infarct noted. Known small acute infarcts within the left frontal white matter, bilateral thalami, and right cerebellum are not well seen. No evidence for hemorrhagic transformation. No acute intracranial hemorrhage elsewhere. No other acute large vessel territory infarct. No mass lesion, midline shift or mass effect. No hydrocephalus. No extra-axial fluid collection. Vascular: No hyperdense vessel. Calcified atherosclerosis at the skull base. Skull: Scalp soft tissues and calvarium within normal limits. Calvarium intact. Sinuses: Paranasal sinuses are clear. Chronic right  mastoid effusion, stable. Orbits: Globes normal soft tissues demonstrate no acute finding. Review of the MIP images confirms the above findings CTA NECK FINDINGS Aortic arch: Visualized aortic arch of normal caliber with normal 3 vessel morphology. Mild-to-moderate atherosclerotic change about the aortic arch and origin of the great vessels up to approximately 50% stenosis at the proximal left subclavian artery again noted, stable. No other hemodynamically significant stenosis about the proximal great vessels. Visualized subclavian arteries otherwise widely patent. Right carotid system: Right CCA tortuous but widely patent to the bifurcation without  stenosis. Mild a centric mixed plaque about the right bifurcation without hemodynamically significant stenosis. Right ICA widely patent distally to the skull base without stenosis, dissection or occlusion. Left carotid system: Left CCA tortuous but widely patent to the bifurcation without stenosis. No significant atheromatous narrowing of the left bifurcation. Left ICA tortuous with mild atheromatous irregularity but is widely patent without stenosis, dissection or occlusion. Vertebral arteries: Both vertebral arteries arise from the subclavian arteries. Left vertebral artery slightly dominant. Vertebral arteries patent within the neck without stenosis, dissection or occlusion. Skeleton: No acute osseous finding. No discrete lytic or blastic osseous lesions. Moderate to advanced cervical spondylosis at C3-4 through C6-7. Grade 1 facet mediated anterolisthesis of C7 on T1, stable. Other neck: No other acute soft tissue abnormality within the neck. Upper chest: Small focal filling defects seen within a left upper lobe segmental pulmonary artery, consistent with acute pulmonary embolism (series 9, image 160). No other visible pulmonary emboli. Moderate layering bilateral pleural effusions with associated atelectasis. Underlying mild pulmonary interstitial edema. Review of the MIP images confirms the above findings CTA HEAD FINDINGS Anterior circulation: Petrous segments widely patent. Calcified atheromatous plaque within the carotid siphons with no more than mild multifocal stenosis. ICA termini well perfused. A1 segments patent bilaterally. Normal anterior communicating artery complex. Anterior cerebral arteries widely patent to their distal aspects. No M1 stenosis or occlusion. Negative MCA bifurcations. Distal MCA branches well perfused and symmetric. Posterior circulation: Dominant left vertebral artery demonstrates scattered atheromatous irregularity but is patent to the vertebrobasilar junction without high-grade  stenosis. Patent left PICA. Hypoplastic right vertebral artery largely terminates in PICA. Right PICA patent as well. Basilar artery diffusely diminutive but patent to its distal aspect, stable. Superior cerebral arteries patent bilaterally. Predominant fetal type origin of the PCAs. Right PCA patent to its distal aspect without stenosis. Short-segment severe proximal left P2 stenosis, unchanged. Venous sinuses: Grossly patent allowing for timing the contrast bolus. Anatomic variants: Predominant fetal type origin of the PCAs with overall diminutive vertebrobasilar system. Review of the MIP images confirms the above findings IMPRESSION: 1. Stable CTA of the head and neck as compared to recent exam from 11/20/2018. No emergent large vessel occlusion. 2. Short-segment severe proximal left P2 stenosis, stable. 3. Predominant fetal type origin of the PCAs with overall diminutive vertebrobasilar system. 4. Focal filling defect within a left upper lobe segmental pulmonary artery, consistent with a small acute PE. 5. Moderate layering bilateral pleural effusions with associated atelectasis and pulmonary interstitial edema. Current attempt is being made to contact the covering clinician regarding these findings. Results will be conveyed as soon as possible. Electronically Signed: By: Rise Mu M.D. On: 12/19/2018 20:50   Ct Angio Neck W Or Wo Contrast  Addendum Date: 12/19/2018   ADDENDUM REPORT: 12/19/2018 21:03 ADDENDUM: Critical Value/emergent results were called by telephone at the time of interpretation on 12/19/2018 at 8:58 pm to provider Dr. Bruna Potter, Who verbally acknowledged these results. Electronically Signed  By: Rise Mu M.D.   On: 12/19/2018 21:03   Result Date: 12/19/2018 CLINICAL DATA:  Initial evaluation for acute stroke, altered mental status. EXAM: CT ANGIOGRAPHY HEAD AND NECK TECHNIQUE: Multidetector CT imaging of the head and neck was performed using the standard protocol  during bolus administration of intravenous contrast. Multiplanar CT image reconstructions and MIPs were obtained to evaluate the vascular anatomy. Carotid stenosis measurements (when applicable) are obtained utilizing NASCET criteria, using the distal internal carotid diameter as the denominator. CONTRAST:  4mL OMNIPAQUE IOHEXOL 350 MG/ML SOLN COMPARISON:  Prior MRI from earlier same day as well as recent CTA from 11/20/2018. FINDINGS: CT HEAD FINDINGS Brain: Age-related cerebral atrophy with chronic small vessel ischemic disease, stable. Remote left cerebellar infarct noted. Known small acute infarcts within the left frontal white matter, bilateral thalami, and right cerebellum are not well seen. No evidence for hemorrhagic transformation. No acute intracranial hemorrhage elsewhere. No other acute large vessel territory infarct. No mass lesion, midline shift or mass effect. No hydrocephalus. No extra-axial fluid collection. Vascular: No hyperdense vessel. Calcified atherosclerosis at the skull base. Skull: Scalp soft tissues and calvarium within normal limits. Calvarium intact. Sinuses: Paranasal sinuses are clear. Chronic right mastoid effusion, stable. Orbits: Globes normal soft tissues demonstrate no acute finding. Review of the MIP images confirms the above findings CTA NECK FINDINGS Aortic arch: Visualized aortic arch of normal caliber with normal 3 vessel morphology. Mild-to-moderate atherosclerotic change about the aortic arch and origin of the great vessels up to approximately 50% stenosis at the proximal left subclavian artery again noted, stable. No other hemodynamically significant stenosis about the proximal great vessels. Visualized subclavian arteries otherwise widely patent. Right carotid system: Right CCA tortuous but widely patent to the bifurcation without stenosis. Mild a centric mixed plaque about the right bifurcation without hemodynamically significant stenosis. Right ICA widely patent  distally to the skull base without stenosis, dissection or occlusion. Left carotid system: Left CCA tortuous but widely patent to the bifurcation without stenosis. No significant atheromatous narrowing of the left bifurcation. Left ICA tortuous with mild atheromatous irregularity but is widely patent without stenosis, dissection or occlusion. Vertebral arteries: Both vertebral arteries arise from the subclavian arteries. Left vertebral artery slightly dominant. Vertebral arteries patent within the neck without stenosis, dissection or occlusion. Skeleton: No acute osseous finding. No discrete lytic or blastic osseous lesions. Moderate to advanced cervical spondylosis at C3-4 through C6-7. Grade 1 facet mediated anterolisthesis of C7 on T1, stable. Other neck: No other acute soft tissue abnormality within the neck. Upper chest: Small focal filling defects seen within a left upper lobe segmental pulmonary artery, consistent with acute pulmonary embolism (series 9, image 160). No other visible pulmonary emboli. Moderate layering bilateral pleural effusions with associated atelectasis. Underlying mild pulmonary interstitial edema. Review of the MIP images confirms the above findings CTA HEAD FINDINGS Anterior circulation: Petrous segments widely patent. Calcified atheromatous plaque within the carotid siphons with no more than mild multifocal stenosis. ICA termini well perfused. A1 segments patent bilaterally. Normal anterior communicating artery complex. Anterior cerebral arteries widely patent to their distal aspects. No M1 stenosis or occlusion. Negative MCA bifurcations. Distal MCA branches well perfused and symmetric. Posterior circulation: Dominant left vertebral artery demonstrates scattered atheromatous irregularity but is patent to the vertebrobasilar junction without high-grade stenosis. Patent left PICA. Hypoplastic right vertebral artery largely terminates in PICA. Right PICA patent as well. Basilar artery  diffusely diminutive but patent to its distal aspect, stable. Superior cerebral arteries patent bilaterally. Predominant  fetal type origin of the PCAs. Right PCA patent to its distal aspect without stenosis. Short-segment severe proximal left P2 stenosis, unchanged. Venous sinuses: Grossly patent allowing for timing the contrast bolus. Anatomic variants: Predominant fetal type origin of the PCAs with overall diminutive vertebrobasilar system. Review of the MIP images confirms the above findings IMPRESSION: 1. Stable CTA of the head and neck as compared to recent exam from 11/20/2018. No emergent large vessel occlusion. 2. Short-segment severe proximal left P2 stenosis, stable. 3. Predominant fetal type origin of the PCAs with overall diminutive vertebrobasilar system. 4. Focal filling defect within a left upper lobe segmental pulmonary artery, consistent with a small acute PE. 5. Moderate layering bilateral pleural effusions with associated atelectasis and pulmonary interstitial edema. Current attempt is being made to contact the covering clinician regarding these findings. Results will be conveyed as soon as possible. Electronically Signed: By: Rise Mu M.D. On: 12/19/2018 20:50   Ct Angio Chest Pe W And/or Wo Contrast  Result Date: 12/18/2018 CLINICAL DATA:  Shortness of breath. EXAM: CT ANGIOGRAPHY CHEST WITH CONTRAST TECHNIQUE: Multidetector CT imaging of the chest was performed using the standard protocol during bolus administration of intravenous contrast. Multiplanar CT image reconstructions and MIPs were obtained to evaluate the vascular anatomy. CONTRAST:  80 mL OMNIPAQUE IOHEXOL 350 MG/ML SOLN COMPARISON:  11/17/2018 and 11/12/2018 FINDINGS: Cardiovascular: Heart is normal size. Small amount of pericardial fluid is present anteriorly. Thoracic aorta demonstrates no evidence of aneurysm or dissection. There is mild calcified plaque over the thoracic aorta. Pulmonary or system is well  opacified demonstrates suggestion of small emboli over the proximal left upper lobar pulmonary artery. There is also suggestion of subtle emboli over a subsegmental right middle lobe pulmonary artery. RV/LV ratio 0.96. Remaining vascular structures are unremarkable. Mediastinum/Nodes: 1 cm precarinal lymph node is present. 1.3 cm subcarinal lymph node. No significant hilar adenopathy. Remaining mediastinal structures are unremarkable. Lungs/Pleura: Lungs are adequately inflated demonstrate small bilateral pleural effusions with associated posterior atelectatic change. Subtle patchy density over the right upper lobe with mild nodularity as this could represent infection infection. Airways are unremarkable. Upper Abdomen: No acute findings. Mild calcified plaque over the abdominal aorta. Musculoskeletal: Degenerative change of the spine. Review of the MIP images confirms the above findings. IMPRESSION: 1. Findings suggesting subtle emboli over the proximal left upper lobe pulmonary artery and subsegmental right middle lobar pulmonary artery. RV/LV ratio slightly elevated at 0.96 indicating mild right heart strain. 2. Small bilateral pleural effusions with associated atelectasis. Mild patchy post density over the right upper lobe with mild nodularity which may be due to infection. Minimal mediastinal adenopathy likely reactive. Recommend follow-up CT 4-6 weeks. 3.  Aortic Atherosclerosis (ICD10-I70.0). Critical Value/emergent results were called by telephone at the time of interpretation on 12/18/2018 at 2:46 pm to providerBOWIE TRAN , who verbally acknowledged these results. Electronically Signed   By: Elberta Fortis M.D.   On: 12/18/2018 14:50   Mr Brain Wo Contrast  Result Date: 12/19/2018 CLINICAL DATA:  Altered mental status unclear etiology EXAM: MRI HEAD WITHOUT CONTRAST TECHNIQUE: Multiplanar, multiecho pulse sequences of the brain and surrounding structures were obtained without intravenous contrast.  COMPARISON:  MRI head 11/20/2018 FINDINGS: Brain: Small areas of acute infarct in the thalamus bilaterally have developed since the prior study. Small acute infarct left frontal white matter. Small acute infarct right cerebellum also new. Generalized atrophy with chronic microvascular ischemic change in the white matter. Chronic microhemorrhage left cerebellar tonsil and left thalamus. Negative for  mass or midline shift. Vascular: Normal arterial flow voids. Skull and upper cervical spine: No acute skeletal abnormality. C1-2 arthropathy with pannus posterior to the dens. No significant spinal stenosis. Mild anterolisthesis C2-3. Sinuses/Orbits: Paranasal sinuses clear. Right mastoid effusion. No orbital lesion. Other: None IMPRESSION: Compared with the recent MRI of 11/20/2018, new areas of acute infarct are noted in the thalamus bilaterally, left frontal white matter, and right lateral cerebellum. Electronically Signed   By: Marlan Palauharles  Clark M.D.   On: 12/19/2018 13:08   Mr Thoracic Spine Wo Contrast  Result Date: 12/19/2018 CLINICAL DATA:  Back pain.  Endocarditis. EXAM: MRI THORACIC AND LUMBAR SPINE WITHOUT CONTRAST TECHNIQUE: Multiplanar and multiecho pulse sequences of the thoracic and lumbar spine were obtained without intravenous contrast. COMPARISON:  MRI thoracic and lumbar spine 11/18/2018 FINDINGS: MRI THORACIC SPINE FINDINGS Alignment: Normal alignment. Extension weighted dorsal kyphosis T7-8. Vertebrae: Negative for fracture or mass. Scattered hemangiomata. Discogenic changes in the midthoracic spine Cord: Spinal cord signal normal throughout the thoracic spine. Cord flattening at T7-8 due to disc protrusion Paraspinal and other soft tissues: Small bilateral pleural effusions. No paraspinous mass. Disc levels: T2-3: Broad-based central disc protrusion without significant stenosis T4-5: Small right-sided disc protrusion T6-7: Moderate disc degeneration. Small left-sided disc protrusion with mild cord  flattening on the left T7-8: Moderately large central disc protrusion with cord flattening unchanged from the prior study. T8-9: Small right-sided disc protrusion T9-10: Disc degeneration with right-sided spurring. Mild right foraminal stenosis. T11-12: Small left-sided disc protrusion. The patient was not able to tolerate postcontrast imaging of the thoracic or lumbar spine. MRI LUMBAR SPINE FINDINGS Segmentation:  Normal Alignment: Lumbar dextroscoliosis. Mild retrolisthesis L1-2 and L2-3 Vertebrae: Negative for fracture. New endplate edema at O9-61-2 with associated fluid signal in the disc space. Findings are suggestive of discitis osteomyelitis given the history of endocarditis and interval development since 1 month ago. Conus medullaris and cauda equina: Conus extends to the L2 level. Conus and cauda equina appear normal. Paraspinal and other soft tissues: New fluid collection right psoas muscle at the L1-2 level measuring 10 x 15 mm. New 6 mm fluid collection left upper psoas muscle at the L2 level. Probable abscesses given the other findings. Disc levels: T12-L1: Negative L1-2: Fluid in the disc space with new endplate edema. Fluid in the disc space projects into the left epidural fluid collection most compatible with small epidural abscess. This is new. Diffuse endplate spurring with foraminal narrowing bilaterally due to spurring. L2-3: Disc degeneration and spurring. Bilateral facet degeneration. Severe subarticular stenosis on the left and mild spinal stenosis. Mild subarticular stenosis on the right L3-4: Moderate spinal stenosis and moderate subarticular and foraminal stenosis left greater than right. Disc degeneration and spurring with moderate facet hypertrophy bilaterally L4-5: Disc degeneration with diffuse endplate spurring and bilateral facet hypertrophy. Mild spinal stenosis. Moderate subarticular bilaterally. L5-S1: Disc degeneration with endplate spurring. Bilateral facet hypertrophy. Moderate  right foraminal encroachment unchanged. IMPRESSION: MR THORACIC SPINE IMPRESSION Multilevel degenerative change throughout the thoracic spine. No evidence of discitis osteomyelitis or fracture in the thoracic spine. Small bilateral pleural effusions. MR LUMBAR SPINE IMPRESSION Interval development of endplate edema at E9-51-2. This is most consistent with discitis and osteomyelitis. Small left-sided epidural abscess. Bilateral small psoas abscesses have developed since the prior MRI. Patient was not able to tolerate postcontrast imaging. Electronically Signed   By: Marlan Palauharles  Clark M.D.   On: 12/19/2018 13:45   Mr Lumbar Spine Wo Contrast  Result Date: 12/19/2018 CLINICAL DATA:  Back  pain.  Endocarditis. EXAM: MRI THORACIC AND LUMBAR SPINE WITHOUT CONTRAST TECHNIQUE: Multiplanar and multiecho pulse sequences of the thoracic and lumbar spine were obtained without intravenous contrast. COMPARISON:  MRI thoracic and lumbar spine 11/18/2018 FINDINGS: MRI THORACIC SPINE FINDINGS Alignment: Normal alignment. Extension weighted dorsal kyphosis T7-8. Vertebrae: Negative for fracture or mass. Scattered hemangiomata. Discogenic changes in the midthoracic spine Cord: Spinal cord signal normal throughout the thoracic spine. Cord flattening at T7-8 due to disc protrusion Paraspinal and other soft tissues: Small bilateral pleural effusions. No paraspinous mass. Disc levels: T2-3: Broad-based central disc protrusion without significant stenosis T4-5: Small right-sided disc protrusion T6-7: Moderate disc degeneration. Small left-sided disc protrusion with mild cord flattening on the left T7-8: Moderately large central disc protrusion with cord flattening unchanged from the prior study. T8-9: Small right-sided disc protrusion T9-10: Disc degeneration with right-sided spurring. Mild right foraminal stenosis. T11-12: Small left-sided disc protrusion. The patient was not able to tolerate postcontrast imaging of the thoracic or lumbar  spine. MRI LUMBAR SPINE FINDINGS Segmentation:  Normal Alignment: Lumbar dextroscoliosis. Mild retrolisthesis L1-2 and L2-3 Vertebrae: Negative for fracture. New endplate edema at U8-8 with associated fluid signal in the disc space. Findings are suggestive of discitis osteomyelitis given the history of endocarditis and interval development since 1 month ago. Conus medullaris and cauda equina: Conus extends to the L2 level. Conus and cauda equina appear normal. Paraspinal and other soft tissues: New fluid collection right psoas muscle at the L1-2 level measuring 10 x 15 mm. New 6 mm fluid collection left upper psoas muscle at the L2 level. Probable abscesses given the other findings. Disc levels: T12-L1: Negative L1-2: Fluid in the disc space with new endplate edema. Fluid in the disc space projects into the left epidural fluid collection most compatible with small epidural abscess. This is new. Diffuse endplate spurring with foraminal narrowing bilaterally due to spurring. L2-3: Disc degeneration and spurring. Bilateral facet degeneration. Severe subarticular stenosis on the left and mild spinal stenosis. Mild subarticular stenosis on the right L3-4: Moderate spinal stenosis and moderate subarticular and foraminal stenosis left greater than right. Disc degeneration and spurring with moderate facet hypertrophy bilaterally L4-5: Disc degeneration with diffuse endplate spurring and bilateral facet hypertrophy. Mild spinal stenosis. Moderate subarticular bilaterally. L5-S1: Disc degeneration with endplate spurring. Bilateral facet hypertrophy. Moderate right foraminal encroachment unchanged. IMPRESSION: MR THORACIC SPINE IMPRESSION Multilevel degenerative change throughout the thoracic spine. No evidence of discitis osteomyelitis or fracture in the thoracic spine. Small bilateral pleural effusions. MR LUMBAR SPINE IMPRESSION Interval development of endplate edema at B1-6. This is most consistent with discitis and  osteomyelitis. Small left-sided epidural abscess. Bilateral small psoas abscesses have developed since the prior MRI. Patient was not able to tolerate postcontrast imaging. Electronically Signed   By: Marlan Palau M.D.   On: 12/19/2018 13:45   Dg Chest Port 1 View  Result Date: 12/18/2018 CLINICAL DATA:  85 year old female with shortness of breath and cough. EXAM: PORTABLE CHEST 1 VIEW COMPARISON:  Chest CT dated 11/17/2018 and radiograph dated 11/12/2018 FINDINGS: Right-sided PICC with tip at the cavoatrial junction. Left lung base density, likely combination of trace pleural effusion and subsegmental atelectasis/scarring the right lung is clear. There is no pneumothorax. Stable cardiomediastinal silhouette. The aorta is tortuous. There is atherosclerotic calcification of the aorta. No acute osseous pathology. Developing infiltrate is not excluded. Clinical correlation is recommended. IMPRESSION: 1. Right-sided PICC with tip at the cavoatrial junction. No pneumothorax. 2. Left lung base density, likely combination of trace  pleural effusion and atelectasis/scarring. Developing infiltrate is not excluded. Electronically Signed   By: Elgie Collard M.D.   On: 12/18/2018 11:57   Vas [place] Lower Extremity Venous (dvt)  Result Date: 12/19/2018  Lower Venous Study Indications: Pulmonary embolism.  Comparison Study: no current prior Performing Technologist: Blanch Media RVS  Examination Guidelines: A complete evaluation includes B-mode imaging, spectral Doppler, color Doppler, and power Doppler as needed of all accessible portions of each vessel. Bilateral testing is considered an integral part of a complete examination. Limited examinations for reoccurring indications may be performed as noted.  +---------+---------------+---------+-----------+----------+--------------+ RIGHT    CompressibilityPhasicitySpontaneityPropertiesThrombus Aging  +---------+---------------+---------+-----------+----------+--------------+ CFV      Full           Yes      Yes                                 +---------+---------------+---------+-----------+----------+--------------+ SFJ      Full                                                        +---------+---------------+---------+-----------+----------+--------------+ FV Prox  Full                                                        +---------+---------------+---------+-----------+----------+--------------+ FV Mid   Full                                                        +---------+---------------+---------+-----------+----------+--------------+ FV DistalFull                                                        +---------+---------------+---------+-----------+----------+--------------+ PFV      Full                                                        +---------+---------------+---------+-----------+----------+--------------+ POP      Full           Yes      Yes                                 +---------+---------------+---------+-----------+----------+--------------+ PTV      Full                                                        +---------+---------------+---------+-----------+----------+--------------+ PERO  Not visualized +---------+---------------+---------+-----------+----------+--------------+   +---------+---------------+---------+-----------+----------+--------------+ LEFT     CompressibilityPhasicitySpontaneityPropertiesThrombus Aging +---------+---------------+---------+-----------+----------+--------------+ CFV      Full           Yes      Yes                                 +---------+---------------+---------+-----------+----------+--------------+ SFJ      Full                                                         +---------+---------------+---------+-----------+----------+--------------+ FV Prox  Full                                                        +---------+---------------+---------+-----------+----------+--------------+ FV Mid   Full                                                        +---------+---------------+---------+-----------+----------+--------------+ FV DistalFull                                                        +---------+---------------+---------+-----------+----------+--------------+ PFV      Full                                                        +---------+---------------+---------+-----------+----------+--------------+ POP      Full           Yes      Yes                                 +---------+---------------+---------+-----------+----------+--------------+ PTV      Full                                                        +---------+---------------+---------+-----------+----------+--------------+ PERO     Full                                                        +---------+---------------+---------+-----------+----------+--------------+     Summary: Right: There is no evidence of deep vein thrombosis in the lower extremity. No cystic structure found in the popliteal fossa. Left: There is no evidence of deep vein thrombosis in the lower extremity. No cystic  structure found in the popliteal fossa.  *See table(s) above for measurements and observations. Electronically signed by Sherald Hesshristopher Clark MD on 12/19/2018 at 3:32:30 PM.    Final     PHYSICAL EXAM  Temp:  [97.4 F (36.3 C)-98.8 F (37.1 C)] 97.9 F (36.6 C) (11/06 1600) Pulse Rate:  [77-83] 82 (11/06 1600) Resp:  [15-23] 21 (11/06 1600) BP: (117-145)/(52-67) 135/66 (11/06 1600) SpO2:  [91 %-98 %] 98 % (11/06 1600) Weight:  [62.2 kg] 62.2 kg (11/06 0619)  General - Well nourished, well developed, in no apparent distress except LBP.  Ophthalmologic - fundi not  visualized due to noncooperation.  Cardiovascular - Regular rhythm and rate.  Mental Status -  Level of arousal and orientation to time, place, and person were intact. Language including expression, naming, repetition, comprehension was assessed and found intact. Mild dysarthria Fund of Knowledge was assessed and was intact.  Cranial Nerves II - XII - II - Visual field intact OU. III, IV, VI - Extraocular movements intact. V - Facial sensation intact bilaterally. VII - Facial movement intact bilaterally. VIII - Hearing & vestibular intact bilaterally. X - Palate elevates symmetrically. XI - Chin turning & shoulder shrug intact bilaterally. XII - Tongue protrusion intact.  Motor Strength - The patient's strength was 4/5 in BUEs, however, BLEs proximal 3-/5 and 3+/5 knee extension and foot DF/PF due to pain at lower back and pronator drift was absent.  Bulk was normal and fasciculations were absent.   Motor Tone - Muscle tone was assessed at the neck and appendages and was normal.  Reflexes - The patient's reflexes were symmetrical in all extremities and she had no pathological reflexes.  Sensory - Light touch, temperature/pinprick were assessed and were symmetrical.    Coordination - The patient had normal movements in the right hand FTN with no ataxia or dysmetria.  However, left finger-to-nose ataxic.  Tremor was absent.  Gait and Station - deferred.   ASSESSMENT/PLAN Ms. Johnney KillianKaren M [name] is a 85 y.o. female with history of hypertension, chronic systolic congestive heart failure EF 25%, hypothyroidism, left bundle branch block, peripheral neuropathy, who was admitted from 11/12/2018-11/26/2018 for acute ischemic left cerebral infarct in setting of MSSA bacteremia with posterior mitral valve vegetation. To complete abx course 12/31/2018. She presented to ED 12/18/2018 with fever 102.4  and altered mental status. Found to have PE and started on eliquis. MRI revealed new acute B thalamic  infarcts. Neuro consulted.   Stroke:   New bilateral anterior and posterior circulation infarcts in setting of recent large L cerebellar infarct felt to be secondary to MSSA endocarditis   MRI  New acute infarcts B thalami, L frontal white matter, and R lateral cerebellum  CTA head & neck no ELVO. Stable severe proximal L P2 stenosis, hypoplastic VB system. Small acute PE.  No mycotic aneurysms  LE Doppler  No DVT  2D Echo EF 55-60%. No source of embolus   LDL 89  HgbA1c 5.9  IV heparin for VTE prophylaxis  aspirin 325 mg daily prior to admission, now on heparin IV.  Given the punctate size of strokes, no concern of hemorrhagic conversion at this time.  Timing to transition to DOAC as per primary team.  Therapy recommendations:  SNF  Disposition:  pending   PE  CTA chest subtle B PE  On IV heparin  Timing to transition to DOAC as per primary team.  Sepsis Probable HCAP Recent MSSA bacteremia w/ endocarditis  Fever with T-max 102.4  Had  PICC line on arrival, removed  Planned rocephin through 12/31/2018 -> now on cefepime and vancomycin  UCx neg  BCx no growth thus far    Hx stroke/TIA  10/ 07/2018 -  L cerebellar infarct embolic secondary to cardiomyopathy vs. Endocarditis.  CT head and neck left P2 stenosis.  EF initially 25% but repeat EF 55 to 60%.  LDL 89 and A1c 5.9.  TEE showed small mitral valve echodensity concerning for endocarditis.  Negative for PFO.  Antibiotics narrowed down to nafcillin.  Treated w/ aspirin 325 given no mycotic aneurysm and hx cardiomyopathy.   Lower back Pain L1-L2 osteomyelitis discitis and small epidural abscess without resultant spinal stenosis and bilateral small psoas abscess  MR thoracic and lumbar spine - endplate edema L1-2 c/w discitis and osteomyelitis, small L epidural abscess. B small psoas abscesses   NSG consulted  Continue abx course  Hypertension  Stable . Permissive hypertension (OK if <180/105) but gradually  normalize in 5-7 days . Long-term BP goal normotensive  Hyperlipidemia  Home meds:  lipitor 20  Resume lipitor in hospital    Will not use higher intensity statins d/t age  LDL 70, goal < 70  Continue statin at discharge  Other Stroke Risk Factors  Advanced age  Other Active Problems  Acute hypoxic respiratory failure requiring O2 supplementation  Anemia of chronic dz s/p transfusion 11/4  Generalized deconditioning  Hypothyroidism  GERD  Hospital day # 2  Neurology will sign off. Please call with questions. Pt will follow up with stroke clinic NP at [facility] on 01/21/2019. Thanks for the consult.  Marvel Plan, MD PhD Stroke Neurology 12/20/2018 6:28 PM    To contact Stroke Continuity provider, please refer to [email or web address]. After hours, contact General Neurology

## 2018-12-20 NOTE — Evaluation (Signed)
 Physical Therapy Evaluation Patient Details Name: Autumn [name] MRN: [id number] DOB: December 15, 1932 Today's Date: 12/20/2018   History of Present Illness  85 y.o. female with fairly complex recent past medical history including admission from 11/12/2018-11/26/2018 during which she was treated for acute ischemic left cerebellar CVA, MSSA bacteremia with signs of posterior mitral valve vegetation consistent with endocarditis with TEE on 11/20/2018 treated with IV Rocephin (last date tentatively 11/17) discharged to SNF, who presented 11/4 with altered mental status. She was found to be febrile and admitted for sepsis secondary to HCAP on vanc and cefepime, also found to have PE and started on heparin drip and new ischemic strokes. Today, she was complaining of severe lower back pain and an MRI was ordered revealed L1-L2 discitis osteomyelitis with associated epidural abscess    Clinical Impression  Pt needing max encouragement to move.  Observing pts ankle mobilty - I can tell pt has not been up on her feet much in last 3 weeks - PF contractures.  Pt sat EOB and educated on body mechanics.  Pt with intense back pain when moving - incontinent of bowels when moving.  Pt would benefit from PRAFOs while in bed, lumbar corset for pain when getting up and continued PT as pt can tolerate.  I agree with SNF level care.  Son present and educated in heelcord stretches.  Acute PT will follow while pt in the hospital.    Follow Up Recommendations SNF;Supervision/Assistance - 24 hour    Equipment Recommendations  None recommended by PT    Recommendations for Other Services       Precautions / Restrictions Precautions Precautions: Fall Precaution Comments: severe back pain, decub right heel Restrictions Weight Bearing Restrictions: No      Mobility  Bed Mobility Overal bed mobility: Needs Assistance Bed Mobility: Rolling Rolling: Max assist;+2 for physical assistance Sidelying to sit: Max assist;+2 for  physical assistance       General bed mobility comments: Pt needed max encouragement to roll and sit up EOB.  pt educated in good body mechanics.  Pt with intense low back pain during transition.  Pt needed max assist of 2 to sit.  Transfers Overall transfer level: Needs assistance               General transfer comment: pt not wanting to get into chiar.  pt sat EOB for 10 minutes - unabel to do any UE tasks as needing UEs supported on bed to help manage back pain.  pt educated on keeping feet on the floor - ankles tight and feet dont sit flat.  pt educated on need of HOB up for eating and drinking.  Pt educated on deep breathing - this made her cough.  Pt had BM during movement - returned supine and nursing present to clean her up.  Pt did use LEs to help scoot herself up in the bed with +2 assist  Ambulation/Gait             General Gait Details: unable  Stairs            Wheelchair Mobility    Modified Rankin (Stroke Patients Only)       Balance Overall balance assessment: Needs assistance Sitting-balance support: Feet supported;Bilateral upper extremity supported Sitting balance-Leahy Scale: Fair  Pertinent Vitals/Pain Pain Assessment: Faces Faces Pain Scale: Hurts whole lot Pain Location: low back Pain Descriptors / Indicators: Pressure;Constant;Grimacing;Crushing;Moaning;Guarding Pain Intervention(s): Monitored during session;Repositioned;Limited activity within patient's tolerance    Home Living Family/patient expects to be discharged to:: Skilled nursing facility                 Additional Comments: [facility] independent living prior to CVA 3 weeks ago - pt has been at [facility] since then    Prior Function           Comments: pt was independent a month ago.  Now she is bed ridden - sitting up wtih therapy.  son says she has been stnading with therapy at [facility]     Hand  Dominance        Extremity/Trunk Assessment   Upper Extremity Assessment Upper Extremity Assessment: Defer to OT evaluation    Lower Extremity Assessment RLE Deficits / Details: pt with PF contracture - only able to get 20 degrees from neutral with stretching.  pt tight in heelcords.  Hip and knee WFL - PROM.  pt able to do SLR in the bed LLE Deficits / Details: Pt with PF contracture - only abel to get 10 degrees from neutral with stretching. pt tight in heelcords,  hip and knee WFL - PROM with compliants of left knee sorenss with flexion.  Pt unable to do SLR supine    Cervical / Trunk Assessment Cervical / Trunk Assessment: (Pt sat EOB - unable to take UEs off the bed due to back pain.  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 she is now not wanting to move - difficulty asking for waht she wants.  asking to sit down - when she is sitting - she was meaning she wanted to lay down      General Comments General comments (skin integrity, edema, etc.): pts son kurt present for PT/OT eval.  I educated him on heelcord stretches to help ankles from getting so tight.  Pt would benefit from PRAFOs when in bed and lumbar corset for activty - to help with pain management    Exercises Total Joint Exercises Ankle Circles/Pumps: AAROM;10 reps;Supine;Both   Assessment/Plan    PT Assessment Patient needs continued PT services  PT Problem List Decreased strength;Decreased activity tolerance;Cardiopulmonary status limiting activity;Pain;Decreased skin integrity       PT Treatment Interventions DME instruction;Gait training;Functional mobility training;Therapeutic activities;Therapeutic exercise;Balance  training;Cognitive remediation;Patient/family education    PT Goals (Current goals can be found in the Care Plan section)  Acute Rehab PT Goals Patient Stated Goal: To try to move PT Goal Formulation: With patient/family Time For Goal Achievement:  Potential to Achieve Goals: Poor    Frequency Min 2X/week   Barriers to discharge Decreased caregiver support pt needs +2 assist for all mobilty    Co-evaluation PT/OT/SLP Co-Evaluation/Treatment: Yes Reason for Co-Treatment: Complexity of the patient's impairments (multi-system involvement);For patient/therapist safety PT goals addressed during session: Mobility/safety with mobility OT goals addressed during session: ADL's and self-care       AM-PAC PT "6 Clicks" Mobility  Outcome Measure Help needed  turning from your back to your side while in a flat bed without using bedrails?: Total Help needed moving from lying on your back to sitting on the side of a flat bed without using bedrails?: Total Help needed moving to and from a bed to a chair (including a wheelchair)?: Total Help needed standing up from a chair using your arms (e.g., wheelchair or bedside chair)?: Total Help needed to walk in hospital room?: Total Help needed climbing 3-5 steps with a railing? : Total 6 Click Score: 6    End of Session   Activity Tolerance: Patient limited by pain Patient left: in bed;with call bell/phone within reach;with family/visitor present;with nursing/sitter in room Nurse Communication: Mobility status;Precautions PT Visit Diagnosis: Muscle weakness (generalized) (M62.81);Difficulty in walking, not elsewhere classified (R26.2);Other abnormalities of gait and mobility (R26.89);Pain    Time: 7106-2694 PT Time Calculation (min) (ACUTE ONLY): 26 min   Charges:   PT Evaluation $PT Eval Moderate Complexity: 1 Mod          12/20/2018   Rande Lawman, PT   Loyal Buba 12/20/2018, 10:25 AM

## 2018-12-21 DIAGNOSIS — G061 Intraspinal abscess and granuloma: Secondary | ICD-10-CM

## 2018-12-21 DIAGNOSIS — I2690 Septic pulmonary embolism without acute cor pulmonale: Secondary | ICD-10-CM

## 2018-12-21 DIAGNOSIS — I5043 Acute on chronic combined systolic (congestive) and diastolic (congestive) heart failure: Secondary | ICD-10-CM

## 2018-12-21 LAB — BASIC METABOLIC PANEL
Anion gap: 13 (ref 5–15)
BUN: 10 mg/dL (ref 8–23)
CO2: 22 mmol/L (ref 22–32)
Calcium: 7.8 mg/dL — ABNORMAL LOW (ref 8.9–10.3)
Chloride: 96 mmol/L — ABNORMAL LOW (ref 98–111)
Creatinine, Ser: 1.03 mg/dL — ABNORMAL HIGH (ref 0.44–1.00)
GFR calc Af Amer: 58 mL/min — ABNORMAL LOW (ref >60)
GFR calc non Af Amer: 50 mL/min — ABNORMAL LOW (ref >60)
Glucose, Bld: 118 mg/dL — ABNORMAL HIGH (ref 70–99)
Potassium: 3.3 mmol/L — ABNORMAL LOW (ref 3.5–5.1)
Sodium: 131 mmol/L — ABNORMAL LOW (ref 135–145)

## 2018-12-21 LAB — CBC
HCT: 35.1 % — ABNORMAL LOW (ref 36.0–46.0)
Hemoglobin: 12 g/dL (ref 12.0–15.0)
MCH: 32.3 pg (ref 26.0–34.0)
MCHC: 34.2 g/dL (ref 30.0–36.0)
MCV: 94.4 fL (ref 80.0–100.0)
Platelets: 283 10*3/uL (ref 150–400)
RBC: 3.72 MIL/uL — ABNORMAL LOW (ref 3.87–5.11)
RDW: 16.2 % — ABNORMAL HIGH (ref 11.5–15.5)
WBC: 16.6 10*3/uL — ABNORMAL HIGH (ref 4.0–10.5)
nRBC: 0 % (ref 0.0–0.2)

## 2018-12-21 LAB — HEPARIN LEVEL (UNFRACTIONATED): Heparin Unfractionated: 0.34 IU/mL (ref 0.30–0.70)

## 2018-12-21 LAB — MAGNESIUM: Magnesium: 1.5 mg/dL — ABNORMAL LOW (ref 1.7–2.4)

## 2018-12-21 MED ORDER — APIXABAN 5 MG PO TABS
10.0000 mg | ORAL_TABLET | Freq: Two times a day (BID) | ORAL | Status: AC
  Administered 2018-12-21 – 2018-12-27 (×14): 10 mg via ORAL
  Filled 2018-12-21 (×14): qty 2

## 2018-12-21 MED ORDER — POTASSIUM CHLORIDE CRYS ER 20 MEQ PO TBCR
40.0000 meq | EXTENDED_RELEASE_TABLET | Freq: Once | ORAL | Status: AC
  Administered 2018-12-21: 40 meq via ORAL
  Filled 2018-12-21: qty 2

## 2018-12-21 MED ORDER — HALOPERIDOL LACTATE 5 MG/ML IJ SOLN
1.0000 mg | Freq: Four times a day (QID) | INTRAMUSCULAR | Status: DC | PRN
  Administered 2018-12-22: 2 mg via INTRAVENOUS
  Filled 2018-12-21: qty 1

## 2018-12-21 MED ORDER — APIXABAN 5 MG PO TABS
5.0000 mg | ORAL_TABLET | Freq: Two times a day (BID) | ORAL | Status: DC
  Administered 2018-12-28 – 2018-12-29 (×3): 5 mg via ORAL
  Filled 2018-12-21 (×5): qty 1

## 2018-12-21 MED ORDER — MAGNESIUM SULFATE 2 GM/50ML IV SOLN
2.0000 g | Freq: Once | INTRAVENOUS | Status: AC
  Administered 2018-12-21: 2 g via INTRAVENOUS
  Filled 2018-12-21: qty 50

## 2018-12-21 NOTE — Progress Notes (Signed)
Pt pulled out IV from right posterior forearm leaving one IV remaining in right upper arm. Mitten restraints placed to preserve remaining IV. IV team c/s placed to gain another access site for heparin gtt and continuous Abx.

## 2018-12-21 NOTE — Progress Notes (Addendum)
Patient ID: Autumn [name], female   DOB: 1932-05-02, 85 y.o.   MRN: [id number]  PROGRESS NOTE    Autumn [name]  NWG:[id number] DOB: [birth year] DOA: 12/18/2018 PCP: Merlene Laughter, MD   Brief Narrative:  85 year old female with history of hypertension, chronic systolic CHF, hypothyroidism, left bundle branch block, peripheral neuropathy, admission from 11/12/2018-11/26/2018 during which she was treated for acute ischemic left cerebellar CVA, MSSA bacteremia with signs of posterior mitral valve vegetation consistent with endocarditis with TEE on 11/20/2018 as well as acute and chronic CHF with EF decrease down to 25% during which time patient had declined aggressive work-up by cardiology and gastroenterology and she was treated with Rocephin with plan to complete intravenous Rocephin till 12/31/2018 for endocarditis as per ID recommendations and was discharged to rehab facility with indwelling Foley catheter.  Since the stroke, patient has basically been bedbound.  She presented on 12/18/2018 with altered mental status from rehab facility and was found to be febrile.  She was admitted for sepsis secondary to healthcare associated pneumonia and started on broad-spectrum antibiotics.  She was also found to have pulmonary emboli and started on heparin drip.  Assessment & Plan:   Sepsis: Present on admission Probable healthcare associated pneumonia with concern for gram-negative rods versus MRSA pneumonia History of recent MSSA bacteremia and probable mitral valve endocarditis -Patient was on Rocephin prior to presentation till 12/31/2018 as per ID recommendations.  PICC line was discontinued on presentation. -SLP evaluation. -Hemodynamically improving.  Off IV fluids. -Follow cultures.  COVID-19 testing negative. -Initially started on broad-spectrum antibiotics which were switched to IV nafcillin on 12/20/2018 by ID.  Acute L1-L2 discitis/osteomyelitis with possible epidural abscess and bilateral psoas  abscesses Back pain -MRI of thoracolumbar spine showed findings as above.  Neurosurgery was consulted.  Neurosurgery recommends conservative management with antibiotics for now as per ID recommendations. -ID evaluation appreciated: Currently on IV nafcillin.  Will hold off on IR evaluation for now.  Leukocytosis -Monitor  Acute hypoxic respiratory failure -Required supplemental oxygen on presentation.  Currently on room air.  Incentive spirometry.  Pulmonary emboli -CT angiogram of the chest revealed subtle pulmonary emboli of the right and left lungs with mild right heart strain. -Continue Heparin per pharmacy.  Will switch to oral Eliquis today.  Neurology has given clearance for the same. -Echo showed EF of 55 to 60% -Lower extremity duplex ultrasound was negative for DVT.  New acute ischemic strokes in a patient with history of recent ischemic left cerebellar CVA Acute metabolic encephalopathy -MRI of the brain on 12/19/2018 showed new areas of acute infarct.  Neurology was consulted.  CTA of the head and neck was stable as compared to recent imaging. -LDL 89.  A1c 5.9. -Continue statin.  Start Eliquis today.  Outpatient follow-up with neurology -Diet as per SLP recommendations  Combined systolic and diastolic heart failure -Currently euvolemic.  Last EF was noted to be around 25%.  Limited echo done yesterday showed EF of 55 to 60%.  Strict input and output.  Daily weights.  Patient was given 1 dose of Lasix intravenously on 12/20/2018.  Fluid restriction.  Avoid Lasix today.  Blood pressure on the lower side.  Delirium -Patient was confused overnight and pulled out her IV.  Delirium precautions.  Use Haldol if extremely agitated.  Hypokalemia -Replace.  Repeat a.m. labs  Hypomagnesemia -Replace.  Repeat a.m. labs  Hypoalbuminemia -Probably from very poor oral intake.  Nutrition consult.  Anemia of chronic disease -Probably from above.  Status  post 1 unit packed red cell  transfusion on 12/18/2018.  H&H stable.  Generalized deconditioning -PT/OT eval.  Overall prognosis is guarded to poor.  Palliative care following. -If condition does not improve, consider hospice/comfort measures   DVT prophylaxis: Heparin Code Status: DNR Family Communication: Spoke to son/Kurt on phone on 12/21/2018 Disposition Plan: SNF once clinically improved  Consultants: ID/palliative care Neurosurgery/neurology  Procedures:  Echo   1. Left ventricular ejection fraction, by visual estimation, is 55 to 60%. The left ventricle has normal function. There is mildly increased left ventricular hypertrophy.  2. Global right ventricle has normal systolic function.The right ventricular size is normal. No increase in right ventricular wall thickness.  3. Left atrium is mildly dialted.  4. The mitral valve is abnormal. Mild mitral valve regurgitation. There is moderate calcification of the mitral valve leaflets. I do not see an obvious vegetation but image qualite is poor.  5. Right atrial size was normal.  6. The aortic valve was not well visualized. Aortic valve regurgitation is not visualized.  7. The pulmonic valve was not well visualized. Pulmonic valve regurgitation is not visualized.  8. Abnormal septal motion consistent with left bundle branch block.  9. The inferior vena cava is normal in size with greater than 50% respiratory variability, suggesting right atrial pressure of 3 mmHg. 10. Limited echo with poor images. 11. Moderate calcification of the mitral valve leaflet(s). 12. The tricuspid valve is normal in structure. Tricuspid valve regurgitation is trivial.  Antimicrobials:  Vancomycin and cefepime from 12/18/2018-12/20/2018 Nafcillin from 12/20/2018 onwards  Subjective: Patient seen and examined at bedside.  She is a poor historian.  No overnight fever, vomiting reported.  Feels very weak.  Appetite is still poor.  Slightly confused.  Nursing staff reports that patient  was confused overnight and pulled out her IV.  Objective: Vitals:   12/20/18 2321 12/20/18 2341 12/20/18 2351 12/21/18 0341  BP: (!) 90/49 (!) 98/48  (!) 87/56  Pulse: 81 80  80  Resp: 15 (!) 22 20 17   Temp: 98.1 F (36.7 C) 98.1 F (36.7 C)  97.6 F (36.4 C)  TempSrc: Oral Oral  Axillary  SpO2: 98% 92%  97%  Weight:    62.4 kg  Height:        Intake/Output Summary (Last 24 hours) at 12/21/2018 0732 Last data filed at 12/20/2018 2149 Gross per 24 hour  Intake 603.92 ml  Output 2300 ml  Net -1696.08 ml   Filed Weights   12/18/18 1100 12/20/18 0619 12/21/18 0341  Weight: 75.4 kg 62.2 kg 62.4 kg    Examination:  General exam: No distress.  Elderly female lying in bed.  Very poor historian, intermittently confused Respiratory system: Bilateral decreased breath sounds at bases with basilar crackles cardiovascular system: S1-S2 heard, rate controlled Gastrointestinal system: Abdomen is nondistended, soft and nontender. Normal bowel sounds heard. Extremities: No cyanosis; trace lower extremity edema.  Left upper extremity swollen and erythematous. Central nervous system: Intermittently confused.  No focal  neurological deficits. Moving extremities Skin: Diffuse nonblanching erythematous rash over the abdomen and lower extremities. Psychiatry: Could not be assessed because of mental status.     Data Reviewed: I have personally reviewed following labs and imaging studies  CBC: Recent Labs  Lab 12/18/18 1056 12/18/18 1251 12/19/18 0624 12/20/18 0522 12/21/18 0224  WBC 8.7  --  11.6* 12.5* 16.6*  NEUTROABS 5.6  --   --   --   --   HGB 7.9* 7.5* 10.0* 10.8* 12.0  HCT 24.9* 22.0* 30.7* 32.6* 35.1*  MCV 105.1*  --  96.2 96.2 94.4  PLT 261  --  282 274 283   Basic Metabolic Panel: Recent Labs  Lab 12/18/18 1056 12/18/18 1251 12/19/18 0624 12/20/18 0522 12/21/18 0224  NA 133* 133* 132* 131* 131*  K 4.3 4.5 3.9 3.8 3.3*  CL 102  --  100 101 96*  CO2 22  --  22 20*  22  GLUCOSE 151*  --  98 107* 118*  BUN 9  --  10 6* 10  CREATININE 0.85  --  0.75 0.78 1.03*  CALCIUM 8.0*  --  8.2* 8.0* 7.8*  MG  --   --   --  1.7 1.5*   GFR: Estimated Creatinine Clearance: 33.6 mL/min (A) (by C-G formula based on SCr of 1.03 mg/dL (H)). Liver Function Tests: Recent Labs  Lab 12/18/18 1056 12/20/18 0522  AST 30 33  ALT 19 28  ALKPHOS 105 113  BILITOT 0.1* 0.5  PROT 4.6* 5.1*  ALBUMIN 1.6* 1.6*   No results for input(s): LIPASE, AMYLASE in the last 168 hours. No results for input(s): AMMONIA in the last 168 hours. Coagulation Profile: Recent Labs  Lab 12/18/18 1056  INR 1.2   Cardiac Enzymes: No results for input(s): CKTOTAL, CKMB, CKMBINDEX, TROPONINI in the last 168 hours. BNP (last 3 results) No results for input(s): PROBNP in the last 8760 hours. HbA1C: No results for input(s): HGBA1C in the last 72 hours. CBG: No results for input(s): GLUCAP in the last 168 hours. Lipid Profile: No results for input(s): CHOL, HDL, LDLCALC, TRIG, CHOLHDL, LDLDIRECT in the last 72 hours. Thyroid Function Tests: No results for input(s): TSH, T4TOTAL, FREET4, T3FREE, THYROIDAB in the last 72 hours. Anemia Panel: No results for input(s): VITAMINB12, FOLATE, FERRITIN, TIBC, IRON, RETICCTPCT in the last 72 hours. Sepsis Labs: Recent Labs  Lab 12/18/18 1056 12/18/18 1244  PROCALCITON 0.13  --   LATICACIDVEN 2.0* 1.4    Recent Results (from the past 240 hour(s))  Blood Culture (routine x 2)     Status: None (Preliminary result)   Collection Time: 12/18/18 10:47 AM   Specimen: BLOOD RIGHT ARM  Result Value Ref Range Status   Specimen Description BLOOD RIGHT ARM  Final   Special Requests   Final    BOTTLES DRAWN AEROBIC AND ANAEROBIC Blood Culture adequate volume   Culture   Final    NO GROWTH 3 DAYS Performed at [facility], [address] [place], [place] [address]    Report Status PENDING  Incomplete  SARS Coronavirus 2 by RT PCR (hospital  order, performed in [facility] hospital lab) Nasopharyngeal Nasopharyngeal Swab     Status: None   Collection Time: 12/18/18 12:13 PM   Specimen: Nasopharyngeal Swab  Result Value Ref Range Status   SARS Coronavirus 2 NEGATIVE NEGATIVE Final    Comment: (NOTE) If result is NEGATIVE SARS-CoV-2 target nucleic acids are NOT DETECTED. The SARS-CoV-2 RNA is generally detectable in upper and lower  respiratory specimens during the acute phase of infection. The lowest  concentration of SARS-CoV-2 viral copies this assay can detect is 250  copies / mL. A negative result does not preclude SARS-CoV-2 infection  and should not be used as the sole basis for treatment or other  patient management decisions.  A negative result may occur with  improper specimen collection / handling, submission of specimen other  than nasopharyngeal swab, presence of viral mutation(s) within the  areas targeted by this assay, and inadequate number of viral copies  (<250 copies / mL). A negative result must be combined with clinical  observations, patient history, and epidemiological information. If result is POSITIVE SARS-CoV-2 target nucleic acids are DETECTED. The SARS-CoV-2 RNA is generally detectable in upper and lower  respiratory specimens dur ing the acute phase of infection.  Positive  results are indicative of active infection with SARS-CoV-2.  Clinical  correlation with patient history and other diagnostic information is  necessary to determine patient infection status.  Positive results do  not rule out bacterial infection or co-infection with other viruses. If result is PRESUMPTIVE POSTIVE SARS-CoV-2 nucleic acids MAY BE PRESENT.   A presumptive positive result was obtained on the submitted specimen  and confirmed on repeat testing.  While 2019 novel coronavirus  (SARS-CoV-2) nucleic acids may be present in the submitted sample  additional confirmatory testing may be necessary for epidemiological  and  / or clinical management purposes  to differentiate between  SARS-CoV-2 and other Sarbecovirus currently known to infect humans.  If clinically indicated additional testing with an alternate test  methodology [phone]) is advised. The SARS-CoV-2 RNA is generally  detectable in upper and lower respiratory sp ecimens during the acute  phase of infection. The expected result is Negative. Fact Sheet for Patients:  BoilerBrush.com.cy Fact Sheet for Healthcare Providers: https://pope.com/ This test is not yet approved or cleared by the [place] FDA and has been authorized for detection and/or diagnosis of SARS-CoV-2 by FDA under an Emergency Use Authorization (EUA).  This EUA will remain in effect (meaning this test can be used) for the duration of the COVID-19 declaration under Section 564(b)(1) of the Act, 21 U.S.C. section 360bbb-3(b)(1), unless the authorization is terminated or revoked sooner. Performed at [facility], [address], [place], [place] [address]   Blood Culture (routine x 2)     Status: None (Preliminary result)   Collection Time: 12/18/18 12:31 PM   Specimen: BLOOD RIGHT HAND  Result Value Ref Range Status   Specimen Description BLOOD RIGHT HAND  Final   Special Requests   Final    BOTTLES DRAWN AEROBIC ONLY Blood Culture results may not be optimal due to an inadequate volume of blood received in culture bottles   Culture   Final    NO GROWTH 3 DAYS Performed at [facility], [address], [place], [place] [address]    Report Status PENDING  Incomplete  Urine culture     Status: Abnormal   Collection Time: 12/18/18  3:26 PM   Specimen: In/Out Cath Urine  Result Value Ref Range Status   Specimen Description IN/OUT CATH URINE  Final   Special Requests   Final    NONE Performed at [facility], [address] [place], [place] [address]    Culture 20,000 COLONIES/mL YEAST (A)  Final    Report Status 12/19/2018 FINAL  Final  MRSA PCR Screening     Status: None   Collection Time: 12/20/18 12:50 AM   Specimen: Nasal Mucosa; Nasopharyngeal  Result Value Ref Range Status   MRSA by PCR NEGATIVE NEGATIVE Final    Comment:        The GeneXpert MRSA Assay (FDA approved for NASAL specimens only), is one component of a comprehensive MRSA colonization surveillance program. It is not intended to diagnose MRSA infection nor to guide or monitor treatment for MRSA infections. Performed at [facility], [address], [place], [place] [address]          Radiology Studies: Ct Angio Head W Or Wo Contrast  Addendum Date: 12/19/2018   ADDENDUM REPORT: 12/19/2018 21:03 ADDENDUM: Critical Value/emergent results were called by telephone at the time of interpretation on 12/19/2018 at 8:58 pm to provider Dr. Kennon Holter, Who verbally acknowledged these results. Electronically Signed   By: Jeannine Boga M.D.   On: 12/19/2018 21:03   Result Date: 12/19/2018 CLINICAL DATA:  Initial evaluation for acute stroke, altered mental status. EXAM: CT ANGIOGRAPHY HEAD AND NECK TECHNIQUE: Multidetector CT imaging of the head and neck was performed using the standard protocol during bolus administration of intravenous contrast. Multiplanar CT image reconstructions and MIPs were obtained to evaluate the vascular anatomy. Carotid stenosis measurements (when applicable) are obtained utilizing NASCET criteria, using the distal internal carotid diameter as the denominator. CONTRAST:  17mL OMNIPAQUE IOHEXOL 350 MG/ML SOLN COMPARISON:  Prior MRI from earlier same day as well as recent CTA from 11/20/2018. FINDINGS: CT HEAD FINDINGS Brain: Age-related cerebral atrophy with chronic small vessel ischemic disease, stable. Remote left cerebellar infarct noted. Known small acute infarcts within the left frontal white matter, bilateral thalami, and right cerebellum are not well seen. No evidence for hemorrhagic  transformation. No acute intracranial hemorrhage elsewhere. No other acute large vessel territory infarct. No mass lesion, midline shift or mass effect. No hydrocephalus. No extra-axial fluid collection. Vascular: No hyperdense vessel. Calcified atherosclerosis at the skull base. Skull: Scalp soft tissues and calvarium within normal limits. Calvarium intact. Sinuses: Paranasal sinuses are clear. Chronic right mastoid effusion, stable. Orbits: Globes normal soft tissues demonstrate no acute finding. Review of the MIP images confirms the above findings CTA NECK FINDINGS Aortic arch: Visualized aortic arch of normal caliber with normal 3 vessel morphology. Mild-to-moderate atherosclerotic change about the aortic arch and origin of the great vessels up to approximately 50% stenosis at the proximal left subclavian artery again noted, stable. No other hemodynamically significant stenosis about the proximal great vessels. Visualized subclavian arteries otherwise widely patent. Right carotid system: Right CCA tortuous but widely patent to the bifurcation without stenosis. Mild a centric mixed plaque about the right bifurcation without hemodynamically significant stenosis. Right ICA widely patent distally to the skull base without stenosis, dissection or occlusion. Left carotid system: Left CCA tortuous but widely patent to the bifurcation without stenosis. No significant atheromatous narrowing of the left bifurcation. Left ICA tortuous with mild atheromatous irregularity but is widely patent without stenosis, dissection or occlusion. Vertebral arteries: Both vertebral arteries arise from the subclavian arteries. Left vertebral artery slightly dominant. Vertebral arteries patent within the neck without stenosis, dissection or occlusion. Skeleton: No acute osseous finding. No discrete lytic or blastic osseous lesions. Moderate to advanced cervical spondylosis at C3-4 through C6-7. Grade 1 facet mediated anterolisthesis of C7  on T1, stable. Other neck: No other acute soft tissue abnormality within the neck. Upper chest: Small focal filling defects seen within a left upper lobe segmental pulmonary artery, consistent with acute pulmonary embolism (series 9, image 160). No other visible pulmonary emboli. Moderate layering bilateral pleural effusions with associated atelectasis. Underlying mild pulmonary interstitial edema. Review of the MIP images confirms the above findings CTA HEAD FINDINGS Anterior circulation: Petrous segments widely patent. Calcified atheromatous plaque within the carotid siphons with no more than mild multifocal stenosis. ICA termini well perfused. A1 segments patent bilaterally. Normal anterior communicating artery complex. Anterior cerebral arteries widely patent to their distal aspects. No M1 stenosis or occlusion.  Negative MCA bifurcations. Distal MCA branches well perfused and symmetric. Posterior circulation: Dominant left vertebral artery demonstrates scattered atheromatous irregularity but is patent to the vertebrobasilar junction without high-grade stenosis. Patent left PICA. Hypoplastic right vertebral artery largely terminates in PICA. Right PICA patent as well. Basilar artery diffusely diminutive but patent to its distal aspect, stable. Superior cerebral arteries patent bilaterally. Predominant fetal type origin of the PCAs. Right PCA patent to its distal aspect without stenosis. Short-segment severe proximal left P2 stenosis, unchanged. Venous sinuses: Grossly patent allowing for timing the contrast bolus. Anatomic variants: Predominant fetal type origin of the PCAs with overall diminutive vertebrobasilar system. Review of the MIP images confirms the above findings IMPRESSION: 1. Stable CTA of the head and neck as compared to recent exam from 11/20/2018. No emergent large vessel occlusion. 2. Short-segment severe proximal left P2 stenosis, stable. 3. Predominant fetal type origin of the PCAs with overall  diminutive vertebrobasilar system. 4. Focal filling defect within a left upper lobe segmental pulmonary artery, consistent with a small acute PE. 5. Moderate layering bilateral pleural effusions with associated atelectasis and pulmonary interstitial edema. Current attempt is being made to contact the covering clinician regarding these findings. Results will be conveyed as soon as possible. Electronically Signed: By: Rise Mu M.D. On: 12/19/2018 20:50   Ct Angio Neck W Or Wo Contrast  Addendum Date: 12/19/2018   ADDENDUM REPORT: 12/19/2018 21:03 ADDENDUM: Critical Value/emergent results were called by telephone at the time of interpretation on 12/19/2018 at 8:58 pm to provider Dr. Bruna Potter, Who verbally acknowledged these results. Electronically Signed   By: Rise Mu M.D.   On: 12/19/2018 21:03   Result Date: 12/19/2018 CLINICAL DATA:  Initial evaluation for acute stroke, altered mental status. EXAM: CT ANGIOGRAPHY HEAD AND NECK TECHNIQUE: Multidetector CT imaging of the head and neck was performed using the standard protocol during bolus administration of intravenous contrast. Multiplanar CT image reconstructions and MIPs were obtained to evaluate the vascular anatomy. Carotid stenosis measurements (when applicable) are obtained utilizing NASCET criteria, using the distal internal carotid diameter as the denominator. CONTRAST:  75mL OMNIPAQUE IOHEXOL 350 MG/ML SOLN COMPARISON:  Prior MRI from earlier same day as well as recent CTA from 11/20/2018. FINDINGS: CT HEAD FINDINGS Brain: Age-related cerebral atrophy with chronic small vessel ischemic disease, stable. Remote left cerebellar infarct noted. Known small acute infarcts within the left frontal white matter, bilateral thalami, and right cerebellum are not well seen. No evidence for hemorrhagic transformation. No acute intracranial hemorrhage elsewhere. No other acute large vessel territory infarct. No mass lesion, midline shift or mass  effect. No hydrocephalus. No extra-axial fluid collection. Vascular: No hyperdense vessel. Calcified atherosclerosis at the skull base. Skull: Scalp soft tissues and calvarium within normal limits. Calvarium intact. Sinuses: Paranasal sinuses are clear. Chronic right mastoid effusion, stable. Orbits: Globes normal soft tissues demonstrate no acute finding. Review of the MIP images confirms the above findings CTA NECK FINDINGS Aortic arch: Visualized aortic arch of normal caliber with normal 3 vessel morphology. Mild-to-moderate atherosclerotic change about the aortic arch and origin of the great vessels up to approximately 50% stenosis at the proximal left subclavian artery again noted, stable. No other hemodynamically significant stenosis about the proximal great vessels. Visualized subclavian arteries otherwise widely patent. Right carotid system: Right CCA tortuous but widely patent to the bifurcation without stenosis. Mild a centric mixed plaque about the right bifurcation without hemodynamically significant stenosis. Right ICA widely patent distally to the skull base without stenosis, dissection or  occlusion. Left carotid system: Left CCA tortuous but widely patent to the bifurcation without stenosis. No significant atheromatous narrowing of the left bifurcation. Left ICA tortuous with mild atheromatous irregularity but is widely patent without stenosis, dissection or occlusion. Vertebral arteries: Both vertebral arteries arise from the subclavian arteries. Left vertebral artery slightly dominant. Vertebral arteries patent within the neck without stenosis, dissection or occlusion. Skeleton: No acute osseous finding. No discrete lytic or blastic osseous lesions. Moderate to advanced cervical spondylosis at C3-4 through C6-7. Grade 1 facet mediated anterolisthesis of C7 on T1, stable. Other neck: No other acute soft tissue abnormality within the neck. Upper chest: Small focal filling defects seen within a left  upper lobe segmental pulmonary artery, consistent with acute pulmonary embolism (series 9, image 160). No other visible pulmonary emboli. Moderate layering bilateral pleural effusions with associated atelectasis. Underlying mild pulmonary interstitial edema. Review of the MIP images confirms the above findings CTA HEAD FINDINGS Anterior circulation: Petrous segments widely patent. Calcified atheromatous plaque within the carotid siphons with no more than mild multifocal stenosis. ICA termini well perfused. A1 segments patent bilaterally. Normal anterior communicating artery complex. Anterior cerebral arteries widely patent to their distal aspects. No M1 stenosis or occlusion. Negative MCA bifurcations. Distal MCA branches well perfused and symmetric. Posterior circulation: Dominant left vertebral artery demonstrates scattered atheromatous irregularity but is patent to the vertebrobasilar junction without high-grade stenosis. Patent left PICA. Hypoplastic right vertebral artery largely terminates in PICA. Right PICA patent as well. Basilar artery diffusely diminutive but patent to its distal aspect, stable. Superior cerebral arteries patent bilaterally. Predominant fetal type origin of the PCAs. Right PCA patent to its distal aspect without stenosis. Short-segment severe proximal left P2 stenosis, unchanged. Venous sinuses: Grossly patent allowing for timing the contrast bolus. Anatomic variants: Predominant fetal type origin of the PCAs with overall diminutive vertebrobasilar system. Review of the MIP images confirms the above findings IMPRESSION: 1. Stable CTA of the head and neck as compared to recent exam from 11/20/2018. No emergent large vessel occlusion. 2. Short-segment severe proximal left P2 stenosis, stable. 3. Predominant fetal type origin of the PCAs with overall diminutive vertebrobasilar system. 4. Focal filling defect within a left upper lobe segmental pulmonary artery, consistent with a small acute  PE. 5. Moderate layering bilateral pleural effusions with associated atelectasis and pulmonary interstitial edema. Current attempt is being made to contact the covering clinician regarding these findings. Results will be conveyed as soon as possible. Electronically Signed: By: Rise Mu M.D. On: 12/19/2018 20:50   Mr Brain Wo Contrast  Result Date: 12/19/2018 CLINICAL DATA:  Altered mental status unclear etiology EXAM: MRI HEAD WITHOUT CONTRAST TECHNIQUE: Multiplanar, multiecho pulse sequences of the brain and surrounding structures were obtained without intravenous contrast. COMPARISON:  MRI head 11/20/2018 FINDINGS: Brain: Small areas of acute infarct in the thalamus bilaterally have developed since the prior study. Small acute infarct left frontal white matter. Small acute infarct right cerebellum also new. Generalized atrophy with chronic microvascular ischemic change in the white matter. Chronic microhemorrhage left cerebellar tonsil and left thalamus. Negative for mass or midline shift. Vascular: Normal arterial flow voids. Skull and upper cervical spine: No acute skeletal abnormality. C1-2 arthropathy with pannus posterior to the dens. No significant spinal stenosis. Mild anterolisthesis C2-3. Sinuses/Orbits: Paranasal sinuses clear. Right mastoid effusion. No orbital lesion. Other: None IMPRESSION: Compared with the recent MRI of 11/20/2018, new areas of acute infarct are noted in the thalamus bilaterally, left frontal white matter, and right lateral cerebellum.  Electronically Signed   By: Marlan Palauharles  Clark M.D.   On: 12/19/2018 13:08   Mr Thoracic Spine Wo Contrast  Result Date: 12/19/2018 CLINICAL DATA:  Back pain.  Endocarditis. EXAM: MRI THORACIC AND LUMBAR SPINE WITHOUT CONTRAST TECHNIQUE: Multiplanar and multiecho pulse sequences of the thoracic and lumbar spine were obtained without intravenous contrast. COMPARISON:  MRI thoracic and lumbar spine 11/18/2018 FINDINGS: MRI THORACIC SPINE  FINDINGS Alignment: Normal alignment. Extension weighted dorsal kyphosis T7-8. Vertebrae: Negative for fracture or mass. Scattered hemangiomata. Discogenic changes in the midthoracic spine Cord: Spinal cord signal normal throughout the thoracic spine. Cord flattening at T7-8 due to disc protrusion Paraspinal and other soft tissues: Small bilateral pleural effusions. No paraspinous mass. Disc levels: T2-3: Broad-based central disc protrusion without significant stenosis T4-5: Small right-sided disc protrusion T6-7: Moderate disc degeneration. Small left-sided disc protrusion with mild cord flattening on the left T7-8: Moderately large central disc protrusion with cord flattening unchanged from the prior study. T8-9: Small right-sided disc protrusion T9-10: Disc degeneration with right-sided spurring. Mild right foraminal stenosis. T11-12: Small left-sided disc protrusion. The patient was not able to tolerate postcontrast imaging of the thoracic or lumbar spine. MRI LUMBAR SPINE FINDINGS Segmentation:  Normal Alignment: Lumbar dextroscoliosis. Mild retrolisthesis L1-2 and L2-3 Vertebrae: Negative for fracture. New endplate edema at B2-81-2 with associated fluid signal in the disc space. Findings are suggestive of discitis osteomyelitis given the history of endocarditis and interval development since 1 month ago. Conus medullaris and cauda equina: Conus extends to the L2 level. Conus and cauda equina appear normal. Paraspinal and other soft tissues: New fluid collection right psoas muscle at the L1-2 level measuring 10 x 15 mm. New 6 mm fluid collection left upper psoas muscle at the L2 level. Probable abscesses given the other findings. Disc levels: T12-L1: Negative L1-2: Fluid in the disc space with new endplate edema. Fluid in the disc space projects into the left epidural fluid collection most compatible with small epidural abscess. This is new. Diffuse endplate spurring with foraminal narrowing bilaterally due to  spurring. L2-3: Disc degeneration and spurring. Bilateral facet degeneration. Severe subarticular stenosis on the left and mild spinal stenosis. Mild subarticular stenosis on the right L3-4: Moderate spinal stenosis and moderate subarticular and foraminal stenosis left greater than right. Disc degeneration and spurring with moderate facet hypertrophy bilaterally L4-5: Disc degeneration with diffuse endplate spurring and bilateral facet hypertrophy. Mild spinal stenosis. Moderate subarticular bilaterally. L5-S1: Disc degeneration with endplate spurring. Bilateral facet hypertrophy. Moderate right foraminal encroachment unchanged. IMPRESSION: MR THORACIC SPINE IMPRESSION Multilevel degenerative change throughout the thoracic spine. No evidence of discitis osteomyelitis or fracture in the thoracic spine. Small bilateral pleural effusions. MR LUMBAR SPINE IMPRESSION Interval development of endplate edema at U1-31-2. This is most consistent with discitis and osteomyelitis. Small left-sided epidural abscess. Bilateral small psoas abscesses have developed since the prior MRI. Patient was not able to tolerate postcontrast imaging. Electronically Signed   By: Marlan Palauharles  Clark M.D.   On: 12/19/2018 13:45   Mr Lumbar Spine Wo Contrast  Result Date: 12/19/2018 CLINICAL DATA:  Back pain.  Endocarditis. EXAM: MRI THORACIC AND LUMBAR SPINE WITHOUT CONTRAST TECHNIQUE: Multiplanar and multiecho pulse sequences of the thoracic and lumbar spine were obtained without intravenous contrast. COMPARISON:  MRI thoracic and lumbar spine 11/18/2018 FINDINGS: MRI THORACIC SPINE FINDINGS Alignment: Normal alignment. Extension weighted dorsal kyphosis T7-8. Vertebrae: Negative for fracture or mass. Scattered hemangiomata. Discogenic changes in the midthoracic spine Cord: Spinal cord signal normal throughout the thoracic spine.  Cord flattening at T7-8 due to disc protrusion Paraspinal and other soft tissues: Small bilateral pleural effusions. No  paraspinous mass. Disc levels: T2-3: Broad-based central disc protrusion without significant stenosis T4-5: Small right-sided disc protrusion T6-7: Moderate disc degeneration. Small left-sided disc protrusion with mild cord flattening on the left T7-8: Moderately large central disc protrusion with cord flattening unchanged from the prior study. T8-9: Small right-sided disc protrusion T9-10: Disc degeneration with right-sided spurring. Mild right foraminal stenosis. T11-12: Small left-sided disc protrusion. The patient was not able to tolerate postcontrast imaging of the thoracic or lumbar spine. MRI LUMBAR SPINE FINDINGS Segmentation:  Normal Alignment: Lumbar dextroscoliosis. Mild retrolisthesis L1-2 and L2-3 Vertebrae: Negative for fracture. New endplate edema at Z6-1 with associated fluid signal in the disc space. Findings are suggestive of discitis osteomyelitis given the history of endocarditis and interval development since 1 month ago. Conus medullaris and cauda equina: Conus extends to the L2 level. Conus and cauda equina appear normal. Paraspinal and other soft tissues: New fluid collection right psoas muscle at the L1-2 level measuring 10 x 15 mm. New 6 mm fluid collection left upper psoas muscle at the L2 level. Probable abscesses given the other findings. Disc levels: T12-L1: Negative L1-2: Fluid in the disc space with new endplate edema. Fluid in the disc space projects into the left epidural fluid collection most compatible with small epidural abscess. This is new. Diffuse endplate spurring with foraminal narrowing bilaterally due to spurring. L2-3: Disc degeneration and spurring. Bilateral facet degeneration. Severe subarticular stenosis on the left and mild spinal stenosis. Mild subarticular stenosis on the right L3-4: Moderate spinal stenosis and moderate subarticular and foraminal stenosis left greater than right. Disc degeneration and spurring with moderate facet hypertrophy bilaterally L4-5: Disc  degeneration with diffuse endplate spurring and bilateral facet hypertrophy. Mild spinal stenosis. Moderate subarticular bilaterally. L5-S1: Disc degeneration with endplate spurring. Bilateral facet hypertrophy. Moderate right foraminal encroachment unchanged. IMPRESSION: MR THORACIC SPINE IMPRESSION Multilevel degenerative change throughout the thoracic spine. No evidence of discitis osteomyelitis or fracture in the thoracic spine. Small bilateral pleural effusions. MR LUMBAR SPINE IMPRESSION Interval development of endplate edema at W9-6. This is most consistent with discitis and osteomyelitis. Small left-sided epidural abscess. Bilateral small psoas abscesses have developed since the prior MRI. Patient was not able to tolerate postcontrast imaging. Electronically Signed   By: Marlan Palau M.D.   On: 12/19/2018 13:45   Vas [place] Lower Extremity Venous (dvt)  Result Date: 12/19/2018  Lower Venous Study Indications: Pulmonary embolism.  Comparison Study: no current prior Performing Technologist: Blanch Media RVS  Examination Guidelines: A complete evaluation includes B-mode imaging, spectral Doppler, color Doppler, and power Doppler as needed of all accessible portions of each vessel. Bilateral testing is considered an integral part of a complete examination. Limited examinations for reoccurring indications may be performed as noted.  +---------+---------------+---------+-----------+----------+--------------+  RIGHT     Compressibility Phasicity Spontaneity Properties Thrombus Aging  +---------+---------------+---------+-----------+----------+--------------+  CFV       Full            Yes       Yes                                    +---------+---------------+---------+-----------+----------+--------------+  SFJ       Full                                                             +---------+---------------+---------+-----------+----------+--------------+  FV Prox   Full                                                              +---------+---------------+---------+-----------+----------+--------------+  FV Mid    Full                                                             +---------+---------------+---------+-----------+----------+--------------+  FV Distal Full                                                             +---------+---------------+---------+-----------+----------+--------------+  PFV       Full                                                             +---------+---------------+---------+-----------+----------+--------------+  POP       Full            Yes       Yes                                    +---------+---------------+---------+-----------+----------+--------------+  PTV       Full                                                             +---------+---------------+---------+-----------+----------+--------------+  PERO                                                       Not visualized  +---------+---------------+---------+-----------+----------+--------------+   +---------+---------------+---------+-----------+----------+--------------+  LEFT      Compressibility Phasicity Spontaneity Properties Thrombus Aging  +---------+---------------+---------+-----------+----------+--------------+  CFV       Full            Yes       Yes                                    +---------+---------------+---------+-----------+----------+--------------+  SFJ       Full                                                             +---------+---------------+---------+-----------+----------+--------------+  FV Prox   Full                                                             +---------+---------------+---------+-----------+----------+--------------+  FV Mid    Full                                                             +---------+---------------+---------+-----------+----------+--------------+  FV Distal Full                                                              +---------+---------------+---------+-----------+----------+--------------+  PFV       Full                                                             +---------+---------------+---------+-----------+----------+--------------+  POP       Full            Yes       Yes                                    +---------+---------------+---------+-----------+----------+--------------+  PTV       Full                                                             +---------+---------------+---------+-----------+----------+--------------+  PERO      Full                                                             +---------+---------------+---------+-----------+----------+--------------+     Summary: Right: There is no evidence of deep vein thrombosis in the lower extremity. No cystic structure found in the popliteal fossa. Left: There is no evidence of deep vein thrombosis in the lower extremity. No cystic structure found in the popliteal fossa.  *See table(s) above for measurements and observations. Electronically signed by Sherald Hess MD on 12/19/2018 at 3:32:30 PM.    Final         Scheduled Meds:  atorvastatin  20 mg Oral QHS   DULoxetine  30 mg Oral Daily   feeding supplement (ENSURE ENLIVE)  237 mL Oral TID BM   multivitamin with minerals  1 tablet Oral Daily   sodium chloride flush  3 mL Intravenous Q12H  Continuous Infusions:  heparin 950 Units/hr (12/20/18 1316)   nafcillin (NAFCIL) continuous infusion 12 g (12/20/18 1336)   sodium chloride 250 mL (12/20/18 2149)          Glade Lloyd, MD [facility] 12/21/2018, 7:32 AM

## 2018-12-21 NOTE — TOC Initial Note (Signed)
Transition of Care [facility]) - Initial/Assessment Note    Patient Details  Name: Autumn [name] MRN: [id number] Date of Birth: 13-Jul-1932  [facility] CM/SW Contact:    Alexander Mt, [place] Phone Number: 12/21/2018, 1:25 PM  Clinical Narrative:                 CSW spoke with pt son [name] via telephone. Introduced self, role, reason for call. Pt son confirms pt from [facility]. We discussed that they had spoken with PMT provider Haynes Dage and that at this time the plan was for continued medical work up and potentially return to SNF. Pt son inquired about if there was a SNF that allowed visitation. We discussed that no inside facility visitation was allowed due to current pandemic restrictions however many SNFs were finding creative ways for visitation to occur after a 14 day quarantine. Pt son amenable to return to [facility] however pt not medically stable. Will continue to follow, provided pt son [name] with name and number.  Expected Discharge Plan: Skilled Nursing Facility Barriers to Discharge: Insurance Authorization, Continued Medical Work up   Patient Goals and CMS Choice Patient states their goals for this hospitalization and ongoing recovery are:: for her to be able to go somewhere with visitation for family CMS Medicare.gov Compare Post Acute Care list provided to:: Patient Represenative (must comment)(pt sons) Choice offered to / list presented to : Adult Children  Expected Discharge Plan and Services Expected Discharge Plan: [place] In-house Referral: Clinical Social Work, Hospice / Palliative Care Discharge Planning Services: CM Consult Post Acute Care Choice: Resumption of Svcs/PTA Provider, [place] arrangements for the past 2 months: Single Family Home, [place]                                      Prior Living Arrangements/Services Living arrangements for the past 2 months:  [place], [place] Lives with:: Self, Facility Resident Patient language and need for interpreter reviewed:: Yes(no needs)        Need for Family Participation in Patient Care: Yes (Comment)(supportive decision making; supervision/assistance with ADL&IADLs) Care giver support system in place?: Yes (comment)(adult sons and facility staff)   Criminal Activity/Legal Involvement Pertinent to Current Situation/Hospitalization: No - Comment as needed  Activities of Daily Living Home Assistive Devices/Equipment: Cane (specify quad or straight) ADL Screening (condition at time of admission) Patient's cognitive ability adequate to safely complete daily activities?: Yes Is the patient deaf or have difficulty hearing?: No Does the patient have difficulty seeing, even when wearing glasses/contacts?: No Does the patient have difficulty concentrating, remembering, or making decisions?: No Patient able to express need for assistance with ADLs?: Yes Does the patient have difficulty dressing or bathing?: No Independently performs ADLs?: Yes (appropriate for developmental age) Does the patient have difficulty walking or climbing stairs?: Yes Weakness of Legs: Both Weakness of Arms/Hands: None  Permission Sought/Granted Permission sought to share information with : Facility Sport and exercise psychologist, Family Supports Permission granted to share information with : No(pt only oriented to self)  Share Information with NAME: Vanecia Limpert  Permission granted to share info w AGENCY: [facility]  Permission granted to share info w Relationship: son  Permission granted to share info w Contact Information: [phone]  Emotional Assessment Appearance:: Appears stated age Attitude/Demeanor/Rapport: Other (comment)(telephonic assessment with son [name]) Affect (typically observed):  Other (comment)(telephonic assessment with son [name]) Orientation: : Oriented to Self Alcohol / Substance Use: Not  Applicable Psych Involvement: No (comment)  Admission diagnosis:  Delirium [R41.0] HCAP (healthcare-associated pneumonia) [J18.9] Other acute pulmonary embolism with acute cor pulmonale (HCC) [I26.09] Patient Active Problem List   Diagnosis Date Noted  . Acute septic pulmonary embolism without acute cor pulmonale (HCC)   . Spinal epidural abscess   . Pressure injury of skin 12/19/2018  . Sepsis (HCC) 12/18/2018  . HCAP (healthcare-associated pneumonia) 12/18/2018  . Acute metabolic encephalopathy 12/18/2018  . Macrocytic anemia 12/18/2018  . Acute respiratory failure with hypoxia (HCC) 12/18/2018  . Acute urinary retention   . Acute on chronic combined systolic and diastolic CHF (congestive heart failure) (HCC)   . Hypothyroidism   . Real time reverse transcriptase PCR positive for severe acute respiratory syndrome coronavirus 2 (SARS-CoV-2) 11/25/2018  . Palliative care encounter   . MRSA bacteremia   . 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 AKI (acute kidney injury) (HCC)   . Cerebral embolism with cerebral infarction 11/20/2018  . Endocarditis of mitral valve   . MSSA bacteremia 11/19/2018  . Septic embolism (HCC)   . Pain of upper abdomen   . Postural dizziness with near syncope 11/13/2018  . Tachypnea   . Nausea and vomiting 11/12/2018  . Bilateral hydronephrosis 11/12/2018  . Elevated troponin 11/12/2018  . Lactic acidosis 11/12/2018  . Chronic neck and back pain 11/12/2018  . Essential hypertension 11/12/2018  . Normal coronary arteries 09/19/2013  . LBBB (left bundle branch block) 09/19/2013  . Congestive dilated cardiomyopathy (HCC) 07/24/2013   PCP:  Merlene Laughter, MD Pharmacy:   [id number] [place],  - [phone] AT [facility] & [address] [place] [address] Phone: [phone] Fax: [phone]     Social Determinants of Health (SDOH)  Interventions    Readmission Risk Interventions Readmission Risk Prevention Plan 12/21/2018  Transportation Screening Complete  PCP or Specialist Appt within 3-5 Days Not Complete  Not Complete comments at this time likely plan for return to SNF  [facility] or Home Care Consult Not Complete  HRI or Home Care Consult comments at this time likely plan for return to SNF  Social Work Consult for Recovery Care Planning/Counseling Complete  Palliative Care Screening Complete  Medication Review Oceanographer) Complete  Some recent data might be hidden

## 2018-12-21 NOTE — Progress Notes (Signed)
Daily Progress Note   Patient Name: Autumn [name]       Date: 12/21/2018 DOB: 1932-05-27  Age: 85 y.o. MRN#: [id number] Attending Physician: Aline August, MD Primary Care Physician: Lajean Manes, MD Admit Date: 12/18/2018  Reason for Consultation/Follow-up: Establishing goals of care and Psychosocial/spiritual support  Subjective: Reviewed chart.  Met at bedside with son Vania Rea.  Patient reports sleeping well last night.  She appears much less anxious and agitated. (this may be due to son being at bedside).  She denies pain and then drifts off back to sleep.  Vania Rea is trying to understand his mother's very complex medication issues.  He asks questions about the blood brain barrier - which I attempted to answer.  We talked about the stroke in 4 places which is likely coming from the staph infection,  as well as the osteomyelitis, discitis and epidural abscess in her back which is also likely from the staph infection.  [name] is likely to sick for any invasive procedures to treat her spine.    Patient's son explained that the family has difficulty with frequent staph infections.  He describes multiple relatives who have had recurrent staph infections.   Assessment: Patient less anxious.  Awake and talking.  Family wanting full scope treatment.   Son [name] is the [place].   Patient Profile/HPI: 85 y.o. female  with past medical history of CHF (EF 55-60%), peripheral neuropathy, who had a recent admission for MSSA bacteremia.  This admission was complicated by PE, the discovery of endocarditis and stroke.  She was discharged to [facility] on IV antibiotics.  She returns after approximate 1 week and was admitted on 12/18/2018 with fever and altered mental status.  Despite being on IV antibiotics for  endocarditis she is found to have osteomyelitis, discitis in L1-L2, epidural abscess, and PE.  MR brain reveals new infarcts bilaterally.     Length of Stay: 3  Current Medications: Scheduled Meds:  . apixaban  10 mg Oral BID   Followed by  . [START ON 12/28/2018] apixaban  5 mg Oral BID  . atorvastatin  20 mg Oral QHS  . DULoxetine  30 mg Oral Daily  . feeding supplement (ENSURE ENLIVE)  237 mL Oral TID BM  . multivitamin with minerals  1 tablet Oral Daily  . sodium  chloride flush  3 mL Intravenous Q12H    Continuous Infusions: . nafcillin (NAFCIL) continuous infusion 12 g (12/21/18 1353)  . sodium chloride 250 mL (12/21/18 1124)    PRN Meds: acetaminophen **OR** acetaminophen, albuterol, diclofenac sodium, haloperidol lactate, LORazepam, ondansetron **OR** ondansetron (ZOFRAN) IV, sodium chloride flush, traMADol  Physical Exam        Well developed female, awake, comfortable. CV rrr resp no distress Abdomen soft, nt, nd  Vital Signs: BP (!) 99/43 (BP Location: Left Arm)   Pulse 84   Temp 97.7 F (36.5 C) (Axillary)   Resp 18   Ht '5\' 3"'$  (1.6 m)   Wt 62.4 kg   SpO2 94%   BMI 24.37 kg/m  SpO2: SpO2: 94 % O2 Device: O2 Device: Room Air O2 Flow Rate: O2 Flow Rate (L/min): 2 L/min  Intake/output summary:   Intake/Output Summary (Last 24 hours) at 12/21/2018 1554 Last data filed at 12/21/2018 6213 Gross per 24 hour  Intake 363.92 ml  Output 2525 ml  Net -2161.08 ml   LBM: Last BM Date: 12/20/18 Baseline Weight: Weight: 75.4 kg(per last visit) Most recent weight: Weight: 62.4 kg       Palliative Assessment/Data: 30%      Patient Active Problem List   Diagnosis Date Noted  . Acute septic pulmonary embolism without acute cor pulmonale (HCC)   . Spinal epidural abscess   . Pressure injury of skin 12/19/2018  . Sepsis ([place]) 12/18/2018  . HCAP (healthcare-associated pneumonia) 12/18/2018  . Acute metabolic encephalopathy 08/65/7846  . Macrocytic anemia  12/18/2018  . Acute respiratory failure with hypoxia ([place]) 12/18/2018  . Acute urinary retention   . Acute on chronic combined systolic and diastolic CHF (congestive heart failure) ([place])   . Hypothyroidism   . Real time reverse transcriptase PCR positive for severe acute respiratory syndrome coronavirus 2 (SARS-CoV-2) 11/25/2018  . Palliative care encounter   . MRSA bacteremia   . 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 AKI (acute kidney injury) ([place])   . Cerebral embolism with cerebral infarction 11/20/2018  . Endocarditis of mitral valve   . MSSA bacteremia 11/19/2018  . Septic embolism ([place])   . Pain of upper abdomen   . Postural dizziness with near syncope 11/13/2018  . Tachypnea   . Nausea and vomiting 11/12/2018  . Bilateral hydronephrosis 11/12/2018  . Elevated troponin 11/12/2018  . Lactic acidosis 11/12/2018  . Chronic neck and back pain 11/12/2018  . Essential hypertension 11/12/2018  . Normal coronary arteries 09/19/2013  . LBBB (left bundle branch block) 09/19/2013  . Congestive dilated cardiomyopathy ([place]) 07/24/2013    Palliative Care Plan    Recommendations/Plan:  Continue full scope treatment.  Patient is a DNR.  PMT will follow intermittently with you and maintain communication with the family.  Doubt the family will change goals of care unless she has a steep decline.  Goals of Care and Additional Recommendations:  Limitations on Scope of Treatment: Full Scope Treatment  Code Status:  DNR  Prognosis:   Unable to determine Patient is at high risk for acute decline or death given multiple levels of infection, and recurrent strokes despite IV antibiotics for weeks.  Discharge Planning:  [place] for rehab with Palliative care service follow-up  Care plan was discussed with family.  Thank you for allowing the Palliative Medicine Team to assist in the care of this patient.  Total time spent:   35  min.  Greater than 50%  of this time was spent counseling and coordinating care related to the above assessment and plan.  Florentina Jenny, PA-C Palliative Medicine  Please contact Palliative MedicineTeam phone at [phone] for questions and concerns between 7 am - 7 pm.   Please see AMION for individual provider pager numbers.

## 2018-12-21 NOTE — Progress Notes (Signed)
Orthopedic Tech Progress Note Patient Details:  [name] [birth year] [id number] Called in order to [facility] for a CORSETT/LSO brace. And I applied PT. Patient ID: Autumn [name], female   DOB: [birth year], 85 y.o.   MRN: [id number]   Janit Pagan 12/21/2018, 11:03 AM

## 2018-12-21 NOTE — NC FL2 (Signed)
[place] MEDICAID FL2 LEVEL OF CARE SCREENING TOOL     IDENTIFICATION  Patient Name: Autumn [name] Birthdate: 07-11-1932 Sex: female Admission Date (Current Location): 12/18/2018  [facility] and [place] Number:  Producer, television/film/video and Address:  [facility], [address] [place], [place] [address]      Provider Number: [id number]  Attending Physician Name and Address:  Glade Lloyd, MD  Relative Name and Phone Number:       Current Level of Care: Hospital Recommended Level of Care: Skilled Nursing Facility Prior Approval Number:    Date Approved/Denied:   PASRR Number: [id number]  Discharge Plan: SNF    Current Diagnoses: Patient Active Problem List   Diagnosis Date Noted  . Acute septic pulmonary embolism without acute cor pulmonale (HCC)   . Spinal epidural abscess   . Pressure injury of skin 12/19/2018  . Sepsis (HCC) 12/18/2018  . HCAP (healthcare-associated pneumonia) 12/18/2018  . Acute metabolic encephalopathy 12/18/2018  . Macrocytic anemia 12/18/2018  . Acute respiratory failure with hypoxia (HCC) 12/18/2018  . Acute urinary retention   . Acute on chronic combined systolic and diastolic CHF (congestive heart failure) (HCC)   . Hypothyroidism   . Real time reverse transcriptase PCR positive for severe acute respiratory syndrome coronavirus 2 (SARS-CoV-2) 11/25/2018  . Palliative care encounter   . MRSA bacteremia   . 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 AKI (acute kidney injury) (HCC)   . Cerebral embolism with cerebral infarction 11/20/2018  . Endocarditis of mitral valve   . MSSA bacteremia 11/19/2018  . Septic embolism (HCC)   . Pain of upper abdomen   . Postural dizziness with near syncope 11/13/2018  . Tachypnea   . Nausea and vomiting 11/12/2018  . Bilateral hydronephrosis 11/12/2018  . Elevated troponin 11/12/2018  . Lactic acidosis 11/12/2018  . Chronic neck and back pain  11/12/2018  . Essential hypertension 11/12/2018  . Normal coronary arteries 09/19/2013  . LBBB (left bundle branch block) 09/19/2013  . Congestive dilated cardiomyopathy (HCC) 07/24/2013    Orientation RESPIRATION BLADDER Height & Weight     Self  Normal Incontinent, Indwelling catheter Weight: 137 lb 9.1 oz (62.4 kg) Height:  5\' 3"  (160 cm)  BEHAVIORAL SYMPTOMS/MOOD NEUROLOGICAL BOWEL NUTRITION STATUS      Continent Diet(see discharge summary)  AMBULATORY STATUS COMMUNICATION OF NEEDS Skin   Extensive Assist Verbally Other (Comment)(deep tissue injury on heel with foam; dry and red on RLE)                       Personal Care Assistance Level of Assistance  Bathing, Feeding, Dressing Bathing Assistance: Maximum assistance Feeding assistance: Independent Dressing Assistance: Maximum assistance     Functional Limitations Info  Sight, Hearing, Speech Sight Info: Impaired Hearing Info: Adequate Speech Info: Adequate    SPECIAL CARE FACTORS FREQUENCY  OT (By licensed OT), PT (By licensed PT)     PT Frequency: 5x week OT Frequency: 5x week            Contractures Contractures Info: Not present    Additional Factors Info  Code Status, Allergies, Psychotropic Code Status Info: DNR Allergies Info: Chlorhexidine, Hydrochlorothiazide, Ramipril, Remeron (Mirtazapine), Sulfa Antibiotics Psychotropic Info: DULoxetine (CYMBALTA) DR capsule 30 mg daily PO         Current Medications (12/21/2018):  This is the current hospital active medication list Current Facility-Administered Medications  Medication Dose Route Frequency Provider  Last Rate Last Dose  . acetaminophen (TYLENOL) tablet 650 mg  650 mg Oral Q6H PRN Fuller Plan A, MD   650 mg at 12/20/18 1327   Or  . acetaminophen (TYLENOL) suppository 650 mg  650 mg Rectal Q6H PRN Smith, Rondell A, MD      . albuterol (PROVENTIL) (2.5 MG/3ML) 0.083% nebulizer solution 2.5 mg  2.5 mg Nebulization Q6H PRN Tamala Julian, Rondell A,  MD      . apixaban (ELIQUIS) tablet 10 mg  10 mg Oral BID Aline August, MD       Followed by  . [START ON 12/28/2018] apixaban (ELIQUIS) tablet 5 mg  5 mg Oral BID Alekh, Kshitiz, MD      . atorvastatin (LIPITOR) tablet 20 mg  20 mg Oral QHS Rosalin Hawking, MD   20 mg at 12/20/18 2145  . diclofenac sodium (VOLTAREN) 1 % transdermal gel 4 g  4 g Topical QID PRN Fuller Plan A, MD      . DULoxetine (CYMBALTA) DR capsule 30 mg  30 mg Oral Daily Dellinger, Marianne L, PA-C   30 mg at 12/20/18 1911  . feeding supplement (ENSURE ENLIVE) (ENSURE ENLIVE) liquid 237 mL  237 mL Oral TID BM Alekh, Kshitiz, MD      . haloperidol lactate (HALDOL) injection 1-2 mg  1-2 mg Intravenous Q6H PRN Starla Link, Kshitiz, MD      . LORazepam (ATIVAN) tablet 0.5 mg  0.5 mg Oral BID PRN Dellinger, Marianne L, PA-C   0.5 mg at 12/20/18 1911  . magnesium sulfate IVPB 2 g 50 mL  2 g Intravenous Once Aline August, MD      . multivitamin with minerals tablet 1 tablet  1 tablet Oral Daily Alekh, Kshitiz, MD      . nafcillin 12 g in sodium chloride 0.9 % 500 mL continuous infusion  12 g Intravenous Q24H Janene Madeira N, NP 20.8 mL/hr at 12/20/18 1336 12 g at 12/20/18 1336  . ondansetron (ZOFRAN) tablet 4 mg  4 mg Oral Q6H PRN Fuller Plan A, MD       Or  . ondansetron (ZOFRAN) injection 4 mg  4 mg Intravenous Q6H PRN Smith, Rondell A, MD      . potassium chloride SA (KLOR-CON) CR tablet 40 mEq  40 mEq Oral Once Alekh, Kshitiz, MD      . sodium chloride 0.9 % bolus 250 mL  250 mL Intravenous BID Rosalia Callas, NP 999 mL/hr at 12/20/18 2149 250 mL at 12/20/18 2149  . sodium chloride flush (NS) 0.9 % injection 10-40 mL  10-40 mL Intracatheter PRN Virgel Manifold, MD      . sodium chloride flush (NS) 0.9 % injection 3 mL  3 mL Intravenous Q12H Smith, Rondell A, MD   3 mL at 12/20/18 2148  . traMADol (ULTRAM) tablet 50 mg  50 mg Oral Q6H PRN Aline August, MD   50 mg at 12/20/18 2145     Discharge Medications: Please see  discharge summary for a list of discharge medications.  Relevant Imaging Results:  Relevant Lab Results:   Additional Information SSN:[id number]  Alexander Mt, LCSWA

## 2018-12-21 NOTE — Progress Notes (Signed)
ANTICOAGULATION CONSULT NOTE   Pharmacy Consult for Eliquis  Indication: pulmonary embolus  Allergies  Allergen Reactions  . Chlorhexidine   . Hydrochlorothiazide Other (See Comments)    REACTION: hyponatremia  . Ramipril Other (See Comments)    REACTION: GI upset  . Remeron [Mirtazapine] Other (See Comments)    REACTION: weakness  . Sulfa Antibiotics Rash    Patient Measurements: Height: 5\' 3"  (160 cm) Weight: 137 lb 9.1 oz (62.4 kg) IBW/kg (Calculated) : 52.4  Vital Signs: Temp: 99.3 F (37.4 C) (11/07 0857) Temp Source: Axillary (11/07 0857) BP: 93/55 (11/07 0857) Pulse Rate: 84 (11/07 0857)  Labs: Recent Labs    12/18/18 1056 12/18/18 1215  12/18/18 1627  12/19/18 0624  12/19/18 2318 12/20/18 0522 12/21/18 0224  HGB 7.9*  --    < >  --   --  10.0*  --   --  10.8* 12.0  HCT 24.9*  --    < >  --   --  30.7*  --   --  32.6* 35.1*  PLT 261  --   --   --   --  282  --   --  274 283  APTT 41*  --   --   --   --   --   --   --   --   --   LABPROT 15.1  --   --   --   --   --   --   --   --   --   INR 1.2  --   --   --   --   --   --   --   --   --   HEPARINUNFRC  --   --   --   --    < >  --    < > 0.44 0.42 0.34  CREATININE 0.85  --   --   --   --  0.75  --   --  0.78 1.03*  TROPONINIHS  --  8  --  7  --   --   --   --   --   --    < > = values in this interval not displayed.    Estimated Creatinine Clearance: 33.6 mL/min (A) (by C-G formula based on SCr of 1.03 mg/dL (H)).   Medical History: Past Medical History:  Diagnosis Date  . Anxiety   . GERD (gastroesophageal reflux disease)   . Hypertension   . Hypothyroid   . LBBB (left bundle branch block)   . MRSA bacteremia 11/19/2018  . Osteopenia   . Peripheral neuropathy   . Sepsis (HCC) 12/2018  . Venous insufficiency     Medications:  Infusions:  . heparin 950 Units/hr (12/20/18 1316)  . magnesium sulfate bolus IVPB    . nafcillin (NAFCIL) continuous infusion 12 g (12/20/18 1336)  . sodium  chloride 250 mL (12/20/18 2149)    Assessment: Autumn [name] is an 85 y/o female with with a history of hypertension, chronic systolic CHF, hypothyroidism, left bundle branch block, peripheral neuropathy, recent CVA, and MSSA with IE. She presented on 11/4 and was admitted for sepsis secondary to PNA and found to have a PE. She was started on a heparin drip.   This morning, her heparin level was therapeutic at 0.34. Pharmacy was consulted to transition the patient from heparin to Eliquis for treatment of PE. Hgb is WNL, Plt stable, no s/sx of bleeding per  the RN.    Goal of Therapy:  Anticoagulation Monitor platelets by anticoagulation protocol: Yes   Plan:  Discontinue heparin gtt at 10:30 this morning Start Eliquis 10 mg PO BID when heparin gtt is discontinued Transition to Eliquis 5 mg PO BID after 7 days Monitor s/sx of bleeding  Agnes Lawrence, PharmD PGY1 Pharmacy Resident

## 2018-12-22 LAB — BASIC METABOLIC PANEL
Anion gap: 11 (ref 5–15)
BUN: 25 mg/dL — ABNORMAL HIGH (ref 8–23)
CO2: 21 mmol/L — ABNORMAL LOW (ref 22–32)
Calcium: 7.6 mg/dL — ABNORMAL LOW (ref 8.9–10.3)
Chloride: 100 mmol/L (ref 98–111)
Creatinine, Ser: 2.49 mg/dL — ABNORMAL HIGH (ref 0.44–1.00)
GFR calc Af Amer: 20 mL/min — ABNORMAL LOW (ref >60)
GFR calc non Af Amer: 17 mL/min — ABNORMAL LOW (ref >60)
Glucose, Bld: 181 mg/dL — ABNORMAL HIGH (ref 70–99)
Potassium: 3.8 mmol/L (ref 3.5–5.1)
Sodium: 132 mmol/L — ABNORMAL LOW (ref 135–145)

## 2018-12-22 LAB — CBC
HCT: 31.7 % — ABNORMAL LOW (ref 36.0–46.0)
Hemoglobin: 10.3 g/dL — ABNORMAL LOW (ref 12.0–15.0)
MCH: 31.6 pg (ref 26.0–34.0)
MCHC: 32.5 g/dL (ref 30.0–36.0)
MCV: 97.2 fL (ref 80.0–100.0)
Platelets: 272 10*3/uL (ref 150–400)
RBC: 3.26 MIL/uL — ABNORMAL LOW (ref 3.87–5.11)
RDW: 16.7 % — ABNORMAL HIGH (ref 11.5–15.5)
WBC: 15.5 10*3/uL — ABNORMAL HIGH (ref 4.0–10.5)
nRBC: 0 % (ref 0.0–0.2)

## 2018-12-22 LAB — MAGNESIUM: Magnesium: 2.5 mg/dL — ABNORMAL HIGH (ref 1.7–2.4)

## 2018-12-22 LAB — HEPARIN LEVEL (UNFRACTIONATED): Heparin Unfractionated: 0.94 IU/mL — ABNORMAL HIGH (ref 0.30–0.70)

## 2018-12-22 MED ORDER — SODIUM CHLORIDE 0.9 % IV SOLN
INTRAVENOUS | Status: DC
  Administered 2018-12-22 – 2018-12-27 (×8): via INTRAVENOUS

## 2018-12-22 NOTE — Plan of Care (Signed)
  Problem: Coping: Goal: Will identify appropriate support needs Outcome: Progressing   Problem: Education: Goal: Knowledge of secondary prevention will improve Outcome: Progressing

## 2018-12-22 NOTE — Progress Notes (Signed)
Pt's spO2 desats intermittently to mid 80s. Becoming more frequent. Placed pt on 2L [place]. Current spO2 95% and holding. Will continue to monitor.

## 2018-12-22 NOTE — Progress Notes (Signed)
Patient ID: Autumn [name], female   DOB: 1933-01-23, 85 y.o.   MRN: [id number]  PROGRESS NOTE    Autumn [name]  UKR:[id number] DOB: 22-Nov-1932 DOA: 12/18/2018 PCP: Merlene Laughter, MD   Brief Narrative:  85 year old female with history of hypertension, chronic systolic CHF, hypothyroidism, left bundle branch block, peripheral neuropathy, admission from 11/12/2018-11/26/2018 during which she was treated for acute ischemic left cerebellar CVA, MSSA bacteremia with signs of posterior mitral valve vegetation consistent with endocarditis with TEE on 11/20/2018 as well as acute and chronic CHF with EF decrease down to 25% during which time patient had declined aggressive work-up by cardiology and gastroenterology and she was treated with Rocephin with plan to complete intravenous Rocephin till 12/31/2018 for endocarditis as per ID recommendations and was discharged to rehab facility with indwelling Foley catheter.  Since the stroke, patient has basically been bedbound.  She presented on 12/18/2018 with altered mental status from rehab facility and was found to be febrile.  She was admitted for sepsis secondary to healthcare associated pneumonia and started on broad-spectrum antibiotics.  She was also found to have pulmonary emboli and started on heparin drip.  She was also found to have new strokes and L1-L2 discitis/osteomyelitis with possible epidural abscess and bilateral psoas abscesses.  Neurology/neurosurgery and ID were consulted.  Assessment & Plan:   Sepsis: Present on admission Probable healthcare associated pneumonia with concern for gram-negative rods versus MRSA pneumonia History of recent MSSA bacteremia and probable mitral valve endocarditis -Patient was on Rocephin prior to presentation till 12/31/2018 as per ID recommendations.  PICC line was discontinued on presentation. -SLP evaluation. -Hemodynamically improving.  Off IV fluids. -Follow cultures.  COVID-19 testing negative. -Initially  started on broad-spectrum antibiotics which were switched to IV nafcillin on 12/20/2018 by ID.  Acute L1-L2 discitis/osteomyelitis with possible epidural abscess and bilateral psoas abscesses Back pain -MRI of thoracolumbar spine showed findings as above.  Neurosurgery was consulted.  Neurosurgery recommends conservative management with antibiotics for now as per ID recommendations. -ID evaluation appreciated: Currently on IV nafcillin.  Will hold off on IR evaluation for now.  Patient will probably need 6 to 8 weeks of antibiotics.  Acute kidney injury  -Creatinine has bumped up to 2.49.  Oral intake is poor.  Patient also has been switched to nafcillin.  We will start intravenous fluids.  Monitor strict input and output.  If renal function keeps worsening, might consider discontinuing nafcillin.  Leukocytosis -Improving.  Monitor  Acute hypoxic respiratory failure -Required supplemental oxygen on presentation.  Currently on room air.  Incentive spirometry.  Pulmonary emboli -CT angiogram of the chest revealed subtle pulmonary emboli of the right and left lungs with mild right heart strain. -Continue Heparin per pharmacy.  Will switch to oral Eliquis today.  Neurology has given clearance for the same. -Echo showed EF of 55 to 60% -Lower extremity duplex ultrasound was negative for DVT.  New acute ischemic strokes in a patient with history of recent ischemic left cerebellar CVA Acute metabolic encephalopathy -MRI of the brain on 12/19/2018 showed new areas of acute infarct.  Neurology was consulted.  CTA of the head and neck was stable as compared to recent imaging. -LDL 89.  A1c 5.9. -Continue statin.  Continue Eliquis.  Outpatient follow-up with neurology -Diet as per SLP recommendations  Combined systolic and diastolic heart failure -Currently euvolemic.  Last EF was noted to be around 25%.  Limited echo done yesterday showed EF of 55 to 60%.  Strict input and  output.  Daily weights.   Patient was given 1 dose of Lasix intravenously on 12/20/2018.  Fluid restriction.  IV fluid as above.  Delirium -Delirium precautions.  Use Haldol if extremely agitated. -Patient pulled out her IV again this morning.  Hypokalemia -Improved.  Hypomagnesemia -Improved.  Hypoalbuminemia -Probably from very poor oral intake.  Follow nutrition recommendations.  Anemia of chronic disease -Probably from above.  Status post 1 unit packed red cell transfusion on 12/18/2018.  H&H stable.  Generalized deconditioning -PT/OT eval.  Overall prognosis is guarded to poor.  Palliative care following. -If condition does not improve, consider hospice/comfort measures   DVT prophylaxis: Heparin Code Status: DNR Family Communication: Spoke to son/Kurt on phone on 12/21/2018 Disposition Plan: SNF once clinically improved  Consultants: ID/palliative care Neurosurgery/neurology  Procedures:  Echo   1. Left ventricular ejection fraction, by visual estimation, is 55 to 60%. The left ventricle has normal function. There is mildly increased left ventricular hypertrophy.  2. Global right ventricle has normal systolic function.The right ventricular size is normal. No increase in right ventricular wall thickness.  3. Left atrium is mildly dialted.  4. The mitral valve is abnormal. Mild mitral valve regurgitation. There is moderate calcification of the mitral valve leaflets. I do not see an obvious vegetation but image qualite is poor.  5. Right atrial size was normal.  6. The aortic valve was not well visualized. Aortic valve regurgitation is not visualized.  7. The pulmonic valve was not well visualized. Pulmonic valve regurgitation is not visualized.  8. Abnormal septal motion consistent with left bundle branch block.  9. The inferior vena cava is normal in size with greater than 50% respiratory variability, suggesting right atrial pressure of 3 mmHg. 10. Limited echo with poor images. 11. Moderate  calcification of the mitral valve leaflet(s). 12. The tricuspid valve is normal in structure. Tricuspid valve regurgitation is trivial.  Antimicrobials:  Vancomycin and cefepime from 12/18/2018-12/20/2018 Nafcillin from 12/20/2018 onwards  Subjective: Patient seen and examined at bedside.  Very poor historian.  No overnight fever or vomiting reported.  Very sleepy, hardly wakes up on calling her name.  Increasing episodes of confusion noted by nursing staff; patient pulled out her IV line again this morning. Objective: Vitals:   12/21/18 2350 12/22/18 0005 12/22/18 0423 12/22/18 0500  BP: (!) 100/40 (!) 92/51 (!) 93/49   Pulse: 73 83 81   Resp: 14 19 15    Temp: 97.8 F (36.6 C) (!) 97.4 F (36.3 C) 97.7 F (36.5 C)   TempSrc: Oral Oral Oral   SpO2: 92% 94% (!) 89%   Weight:    60.3 kg  Height:        Intake/Output Summary (Last 24 hours) at 12/22/2018 0747 Last data filed at 12/22/2018 0430 Gross per 24 hour  Intake 1349.14 ml  Output 975 ml  Net 374.14 ml   Filed Weights   12/20/18 0619 12/21/18 0341 12/22/18 0500  Weight: 62.2 kg 62.4 kg 60.3 kg    Examination:  General exam: No acute distress.  Elderly female lying in bed.  Very poor historian.  Sleepy, hardly wakes up on calling her name.   Respiratory system: Bilateral decreased breath sounds at bases with scattered crackles, no wheezing  cardiovascular system: Rate controlled, S1-S2 heard Gastrointestinal system: Abdomen is nondistended, soft and nontender. Normal bowel sounds heard. Extremities: No cyanosis; trace lower extremity edema.  Left upper extremity swollen and erythematous. Central nervous system: Still looks confused.  No focal  neurological deficits.  Moving extremities Skin: Diffuse nonblanching erythematous rash over the abdomen and lower extremities. Psychiatry: Could not be assessed because of mental status.     Data Reviewed: I have personally reviewed following labs and imaging  studies  CBC: Recent Labs  Lab 12/18/18 1056 12/18/18 1251 12/19/18 0624 12/20/18 0522 12/21/18 0224 12/22/18 0330  WBC 8.7  --  11.6* 12.5* 16.6* 15.5*  NEUTROABS 5.6  --   --   --   --   --   HGB 7.9* 7.5* 10.0* 10.8* 12.0 10.3*  HCT 24.9* 22.0* 30.7* 32.6* 35.1* 31.7*  MCV 105.1*  --  96.2 96.2 94.4 97.2  PLT 261  --  282 274 283 272   Basic Metabolic Panel: Recent Labs  Lab 12/18/18 1056 12/18/18 1251 12/19/18 0624 12/20/18 0522 12/21/18 0224 12/22/18 0330  NA 133* 133* 132* 131* 131* 132*  K 4.3 4.5 3.9 3.8 3.3* 3.8  CL 102  --  100 101 96* 100  CO2 22  --  22 20* 22 21*  GLUCOSE 151*  --  98 107* 118* 181*  BUN 9  --  10 6* 10 25*  CREATININE 0.85  --  0.75 0.78 1.03* 2.49*  CALCIUM 8.0*  --  8.2* 8.0* 7.8* 7.6*  MG  --   --   --  1.7 1.5* 2.5*   GFR: Estimated Creatinine Clearance: 13.9 mL/min (A) (by C-G formula based on SCr of 2.49 mg/dL (H)). Liver Function Tests: Recent Labs  Lab 12/18/18 1056 12/20/18 0522  AST 30 33  ALT 19 28  ALKPHOS 105 113  BILITOT 0.1* 0.5  PROT 4.6* 5.1*  ALBUMIN 1.6* 1.6*   No results for input(s): LIPASE, AMYLASE in the last 168 hours. No results for input(s): AMMONIA in the last 168 hours. Coagulation Profile: Recent Labs  Lab 12/18/18 1056  INR 1.2   Cardiac Enzymes: No results for input(s): CKTOTAL, CKMB, CKMBINDEX, TROPONINI in the last 168 hours. BNP (last 3 results) No results for input(s): PROBNP in the last 8760 hours. HbA1C: No results for input(s): HGBA1C in the last 72 hours. CBG: No results for input(s): GLUCAP in the last 168 hours. Lipid Profile: No results for input(s): CHOL, HDL, LDLCALC, TRIG, CHOLHDL, LDLDIRECT in the last 72 hours. Thyroid Function Tests: No results for input(s): TSH, T4TOTAL, FREET4, T3FREE, THYROIDAB in the last 72 hours. Anemia Panel: No results for input(s): VITAMINB12, FOLATE, FERRITIN, TIBC, IRON, RETICCTPCT in the last 72 hours. Sepsis Labs: Recent Labs  Lab  12/18/18 1056 12/18/18 1244  PROCALCITON 0.13  --   LATICACIDVEN 2.0* 1.4    Recent Results (from the past 240 hour(s))  Blood Culture (routine x 2)     Status: None (Preliminary result)   Collection Time: 12/18/18 10:47 AM   Specimen: BLOOD RIGHT ARM  Result Value Ref Range Status   Specimen Description BLOOD RIGHT ARM  Final   Special Requests   Final    BOTTLES DRAWN AEROBIC AND ANAEROBIC Blood Culture adequate volume   Culture   Final    NO GROWTH 4 DAYS Performed at [facility], [address] [place], [place] [address]    Report Status PENDING  Incomplete  SARS Coronavirus 2 by RT PCR (hospital order, performed in [facility] hospital lab) Nasopharyngeal Nasopharyngeal Swab     Status: None   Collection Time: 12/18/18 12:13 PM   Specimen: Nasopharyngeal Swab  Result Value Ref Range Status   SARS Coronavirus 2 NEGATIVE NEGATIVE Final  Comment: (NOTE) If result is NEGATIVE SARS-CoV-2 target nucleic acids are NOT DETECTED. The SARS-CoV-2 RNA is generally detectable in upper and lower  respiratory specimens during the acute phase of infection. The lowest  concentration of SARS-CoV-2 viral copies this assay can detect is 250  copies / mL. A negative result does not preclude SARS-CoV-2 infection  and should not be used as the sole basis for treatment or other  patient management decisions.  A negative result may occur with  improper specimen collection / handling, submission of specimen other  than nasopharyngeal swab, presence of viral mutation(s) within the  areas targeted by this assay, and inadequate number of viral copies  (<250 copies / mL). A negative result must be combined with clinical  observations, patient history, and epidemiological information. If result is POSITIVE SARS-CoV-2 target nucleic acids are DETECTED. The SARS-CoV-2 RNA is generally detectable in upper and lower  respiratory specimens dur ing the acute phase of infection.  Positive   results are indicative of active infection with SARS-CoV-2.  Clinical  correlation with patient history and other diagnostic information is  necessary to determine patient infection status.  Positive results do  not rule out bacterial infection or co-infection with other viruses. If result is PRESUMPTIVE POSTIVE SARS-CoV-2 nucleic acids MAY BE PRESENT.   A presumptive positive result was obtained on the submitted specimen  and confirmed on repeat testing.  While 2019 novel coronavirus  (SARS-CoV-2) nucleic acids may be present in the submitted sample  additional confirmatory testing may be necessary for epidemiological  and / or clinical management purposes  to differentiate between  SARS-CoV-2 and other Sarbecovirus currently known to infect humans.  If clinically indicated additional testing with an alternate test  methodology [phone] is advised. The SARS-CoV-2 RNA is generally  detectable in upper and lower respiratory sp ecimens during the acute  phase of infection. The expected result is Negative. Fact Sheet for Patients:  BoilerBrush.com.cyhttps://www.fda.gov/media/136312/download Fact Sheet for Healthcare Providers: https://pope.com/https://www.fda.gov/media/136313/download This test is not yet approved or cleared by the [place] FDA and has been authorized for detection and/or diagnosis of SARS-CoV-2 by FDA under an Emergency Use Authorization (EUA).  This EUA will remain in effect (meaning this test can be used) for the duration of the COVID-19 declaration under Section 564(b)(1) of the Act, 21 U.S.C. section 360bbb-3(b)(1), unless the authorization is terminated or revoked sooner. Performed at [facility], [address] [place], [place] [address]   Blood Culture (routine x 2)     Status: None (Preliminary result)   Collection Time: 12/18/18 12:31 PM   Specimen: BLOOD RIGHT HAND  Result Value Ref Range Status   Specimen Description BLOOD RIGHT HAND  Final   Special Requests   Final     BOTTLES DRAWN AEROBIC ONLY Blood Culture results may not be optimal due to an inadequate volume of blood received in culture bottles   Culture   Final    NO GROWTH 4 DAYS Performed at [facility], [address] [place], [place] [address]    Report Status PENDING  Incomplete  Urine culture     Status: Abnormal   Collection Time: 12/18/18  3:26 PM   Specimen: In/Out Cath Urine  Result Value Ref Range Status   Specimen Description IN/OUT CATH URINE  Final   Special Requests   Final    NONE Performed at [facility], [address] [place], [place] [address]    Culture 20,000 COLONIES/mL YEAST (  A)  Final   Report Status 12/19/2018 FINAL  Final  MRSA PCR Screening     Status: None   Collection Time: 12/20/18 12:50 AM   Specimen: Nasal Mucosa; Nasopharyngeal  Result Value Ref Range Status   MRSA by PCR NEGATIVE NEGATIVE Final    Comment:        The GeneXpert MRSA Assay (FDA approved for NASAL specimens only), is one component of a comprehensive MRSA colonization surveillance program. It is not intended to diagnose MRSA infection nor to guide or monitor treatment for MRSA infections. Performed at [facility], [address] [place], [place] [address]          Radiology Studies: No results found.      Scheduled Meds:  apixaban  10 mg Oral BID   Followed by   Melene Muller ON 12/28/2018] apixaban  5 mg Oral BID   atorvastatin  20 mg Oral QHS   DULoxetine  30 mg Oral Daily   feeding supplement (ENSURE ENLIVE)  237 mL Oral TID BM   multivitamin with minerals  1 tablet Oral Daily   sodium chloride flush  3 mL Intravenous Q12H   Continuous Infusions:  nafcillin (NAFCIL) continuous infusion 12 g (12/21/18 1353)   sodium chloride 250 mL (12/21/18 2249)          Glade Lloyd, MD [facility] 12/22/2018, 7:47 AM

## 2018-12-22 NOTE — Progress Notes (Signed)
Pt refusing PO intake other than sips with medicine. Son at bedside with lunch from outside. Unable to convince pt of need to eat. Pt appears oriented and cognizant of her choices. Pt states she, "just wants to sleep."

## 2018-12-23 DIAGNOSIS — M4620 Osteomyelitis of vertebra, site unspecified: Secondary | ICD-10-CM

## 2018-12-23 DIAGNOSIS — M4640 Discitis, unspecified, site unspecified: Secondary | ICD-10-CM

## 2018-12-23 DIAGNOSIS — N179 Acute kidney failure, unspecified: Secondary | ICD-10-CM

## 2018-12-23 DIAGNOSIS — I6340 Cerebral infarction due to embolism of unspecified cerebral artery: Secondary | ICD-10-CM

## 2018-12-23 DIAGNOSIS — I330 Acute and subacute infective endocarditis: Secondary | ICD-10-CM

## 2018-12-23 DIAGNOSIS — R4701 Aphasia: Secondary | ICD-10-CM

## 2018-12-23 LAB — CBC WITH DIFFERENTIAL/PLATELET
Abs Immature Granulocytes: 0.2 10*3/uL — ABNORMAL HIGH (ref 0.00–0.07)
Basophils Absolute: 0.1 10*3/uL (ref 0.0–0.1)
Basophils Relative: 0 %
Eosinophils Absolute: 5.2 10*3/uL — ABNORMAL HIGH (ref 0.0–0.5)
Eosinophils Relative: 32 %
HCT: 31 % — ABNORMAL LOW (ref 36.0–46.0)
Hemoglobin: 10.1 g/dL — ABNORMAL LOW (ref 12.0–15.0)
Immature Granulocytes: 1 %
Lymphocytes Relative: 9 %
Lymphs Abs: 1.5 10*3/uL (ref 0.7–4.0)
MCH: 32 pg (ref 26.0–34.0)
MCHC: 32.6 g/dL (ref 30.0–36.0)
MCV: 98.1 fL (ref 80.0–100.0)
Monocytes Absolute: 1 10*3/uL (ref 0.1–1.0)
Monocytes Relative: 6 %
Neutro Abs: 8.2 10*3/uL — ABNORMAL HIGH (ref 1.7–7.7)
Neutrophils Relative %: 52 %
Platelets: 305 10*3/uL (ref 150–400)
RBC: 3.16 MIL/uL — ABNORMAL LOW (ref 3.87–5.11)
RDW: 16.6 % — ABNORMAL HIGH (ref 11.5–15.5)
WBC: 16.2 10*3/uL — ABNORMAL HIGH (ref 4.0–10.5)
nRBC: 0 % (ref 0.0–0.2)

## 2018-12-23 LAB — BASIC METABOLIC PANEL
Anion gap: 13 (ref 5–15)
BUN: 34 mg/dL — ABNORMAL HIGH (ref 8–23)
CO2: 21 mmol/L — ABNORMAL LOW (ref 22–32)
Calcium: 7.3 mg/dL — ABNORMAL LOW (ref 8.9–10.3)
Chloride: 100 mmol/L (ref 98–111)
Creatinine, Ser: 3.27 mg/dL — ABNORMAL HIGH (ref 0.44–1.00)
GFR calc Af Amer: 14 mL/min — ABNORMAL LOW (ref >60)
GFR calc non Af Amer: 12 mL/min — ABNORMAL LOW (ref >60)
Glucose, Bld: 137 mg/dL — ABNORMAL HIGH (ref 70–99)
Potassium: 4.1 mmol/L (ref 3.5–5.1)
Sodium: 134 mmol/L — ABNORMAL LOW (ref 135–145)

## 2018-12-23 LAB — CULTURE, BLOOD (ROUTINE X 2)
Culture: NO GROWTH
Culture: NO GROWTH
Special Requests: ADEQUATE

## 2018-12-23 LAB — MAGNESIUM: Magnesium: 2.4 mg/dL (ref 1.7–2.4)

## 2018-12-23 MED ORDER — LORAZEPAM 2 MG/ML IJ SOLN
0.5000 mg | Freq: Every day | INTRAMUSCULAR | Status: DC
  Administered 2018-12-23 – 2018-12-30 (×8): 0.5 mg via INTRAVENOUS
  Filled 2018-12-23 (×8): qty 1

## 2018-12-23 MED ORDER — SODIUM CHLORIDE 0.9 % IV SOLN
2.0000 g | Freq: Two times a day (BID) | INTRAVENOUS | Status: DC
  Administered 2018-12-23 – 2018-12-25 (×5): 2 g via INTRAVENOUS
  Filled 2018-12-23 (×6): qty 20

## 2018-12-23 MED ORDER — TRAZODONE HCL 50 MG PO TABS
50.0000 mg | ORAL_TABLET | Freq: Every day | ORAL | Status: DC
  Administered 2018-12-23 – 2018-12-28 (×6): 50 mg via ORAL
  Filled 2018-12-23 (×6): qty 1

## 2018-12-23 NOTE — Progress Notes (Signed)
Patient is firing a RED MEWS due to HR 119 & Respirations 28. This is not an acute change from previous VS. However, Pt does seem a anxious and complaining of not being able to rest. PRN ativan given per MD order. Will continue to monitor.

## 2018-12-23 NOTE — Discharge Instructions (Addendum)
Information on my medicine - ELIQUIS (apixaban)  This medication education was reviewed with me or my healthcare representative as part of my discharge preparation.  The pharmacist that spoke with me during my hospital stay was:  Werner Lean, [facility]  Why was Eliquis prescribed for you? Eliquis was prescribed to treat blood clots that may have been found in the veins of your legs (deep vein thrombosis) or in your lungs (pulmonary embolism) and to reduce the risk of them occurring again.  What do You need to know about Eliquis ? The starting dose is 10 mg (two 5 mg tablets) taken TWICE daily for the FIRST SEVEN (7) DAYS, then on 12/28/18  the dose is reduced to ONE 5 mg tablet taken TWICE daily.  Eliquis may be taken with or without food.   Try to take the dose about the same time in the morning and in the evening. If you have difficulty swallowing the tablet whole please discuss with your pharmacist how to take the medication safely.  Take Eliquis exactly as prescribed and DO NOT stop taking Eliquis without talking to the doctor who prescribed the medication.  Stopping may increase your risk of developing a new blood clot.  Refill your prescription before you run out.  After discharge, you should have regular check-up appointments with your healthcare provider that is prescribing your Eliquis.    What do you do if you miss a dose? If a dose of ELIQUIS is not taken at the scheduled time, take it as soon as possible on the same day and twice-daily administration should be resumed. The dose should not be doubled to make up for a missed dose.  Important Safety Information A possible side effect of Eliquis is bleeding. You should call your healthcare provider right away if you experience any of the following: ? Bleeding from an injury or your nose that does not stop. ? Unusual colored urine (red or dark brown) or unusual colored stools (red or black). ? Unusual bruising for unknown  reasons. ? A serious fall or if you hit your head (even if there is no bleeding).  Some medicines may interact with Eliquis and might increase your risk of bleeding or clotting while on Eliquis. To help avoid this, consult your healthcare provider or pharmacist prior to using any new prescription or non-prescription medications, including herbals, vitamins, non-steroidal anti-inflammatory drugs (NSAIDs) and supplements.  This website has more information on Eliquis (apixaban): http://www.eliquis.com/eliquis/home

## 2018-12-23 NOTE — Progress Notes (Addendum)
Daily Progress Note   Patient Name: Autumn [name]       Date: 12/23/2018 DOB: 09-Jan-1933  Age: 85 y.o. MRN#: [id number] Attending Physician: Autumn Lloyd, MD Primary Care Physician: Autumn Laughter, MD Admit Date: 12/18/2018  Reason for Consultation/Follow-up: Establishing goals of care, Non pain symptom management and Psychosocial/spiritual support  Subjective: Patient moaning.  Denies pain.  Tells me she cant sleep and needs sleep.  Spoke twice on the phone to son, Autumn [name].  He is very worried about his mother.  He complains about the last hospitalization.  I had to ask "do you think she's had a stroke" (then an MRI revealed stroke).  He states that this hospitalization he had to request his mother's back pain be worked up and osteomyelitis was found.   He expresses concern that her PICC line came out 1 week ago and there was no bleeding.  "Do you think she was receiving the antibiotic appropriately?"  He is concerned because he has been advised that we may consider shifting to comfort in the next couple of days if she does not improve.  I discussed the case with Drs. Autumn [name] and Autumn [name], and visited the patient.  Then I called Autumn [name] back.  I explained that she has serious infection in her back, heart, brain, lung, and she has a small abscess on her psoas muscle.  Her kidneys were unable to tolerate the previous antibiotic regimen. Her condition is critical.  I relayed my conversation with Dr. Luciana [name] that we need to give the change in antibiotics (back to Rocephin) a chance to work.  We will know more in 3-5 days and should reassess at that time.  We made a plan to talk again on Friday.   Assessment: Patient appears critically ill.  Confused.  Moaning.  Dr. Luciana [name] feels this may be nafcillin  toxicity.  She has had multiple septic emboli to the brain and lung.  She has osteo/discitis, and epidural abscess.   Patient Profile/HPI:  85 y.o. female  with past medical history of CHF (EF 55-60%), peripheral neuropathy, who had a recent admission for MSSA bacteremia.  This admission was complicated by PE, the discovery of endocarditis and stroke.  She was discharged to [facility] on IV antibiotics.  She returns after approximate 1 week and was admitted on 12/18/2018  with fever and altered mental status.  Despite being on IV antibiotics for endocarditis she is found to have osteomyelitis, discitis in L1-L2, epidural abscess, and PE.  MR brain reveals new infarcts bilaterally.    PMT had the pleasure of meeting the Autumn [name] family last admission.  Autumn [name] is currently a DNR (if she arrests) whose family desires full scope treatment for her if she has not arrested.     Length of Stay: 5  Current Medications: Scheduled Meds:  . apixaban  10 mg Oral BID   Followed by  . [START ON 12/28/2018] apixaban  5 mg Oral BID  . atorvastatin  20 mg Oral QHS  . DULoxetine  30 mg Oral Daily  . feeding supplement (ENSURE ENLIVE)  237 mL Oral TID BM  . LORazepam  0.5 mg Intravenous QHS  . multivitamin with minerals  1 tablet Oral Daily  . sodium chloride flush  3 mL Intravenous Q12H  . traZODone  50 mg Oral QHS    Continuous Infusions: . sodium chloride 75 mL/hr at 12/23/18 0600  . cefTRIAXone (ROCEPHIN)  IV 2 g (12/23/18 1108)  . sodium chloride 250 mL (12/23/18 0948)    PRN Meds: acetaminophen **OR** acetaminophen, albuterol, diclofenac sodium, haloperidol lactate, LORazepam, ondansetron **OR** ondansetron (ZOFRAN) IV, sodium chloride flush, traMADol  Physical Exam       Well developed female appears acutely ill, moaning, flushed pink Tachycardic resp no distress no w/c/r Abdomen soft, nt, nd Ext upper extremities with 1-2+ edema  Vital Signs: BP (!) 149/58 (BP Location: Left Arm)    Pulse 100   Temp 97.9 F (36.6 C) (Oral)   Resp (!) 23   Ht 5\' 3"  (1.6 m)   Wt 61 kg   SpO2 96%   BMI 23.82 kg/m  SpO2: SpO2: 96 % O2 Device: O2 Device: Room Air O2 Flow Rate: O2 Flow Rate (L/min): 2 L/min  Intake/output summary:   Intake/Output Summary (Last 24 hours) at 12/23/2018 1602 Last data filed at 12/23/2018 1257 Gross per 24 hour  Intake 2049.04 ml  Output 550 ml  Net 1499.04 ml   LBM: Last BM Date: 12/21/18 Baseline Weight: Weight: 75.4 kg(per last visit) Most recent weight: Weight: 61 kg       Palliative Assessment/Data: 30%      Patient Active Problem List   Diagnosis Date Noted  . Acute septic pulmonary embolism without acute cor pulmonale (HCC)   . Spinal epidural abscess   . Pressure injury of skin 12/19/2018  . Sepsis ([place]) 12/18/2018  . HCAP (healthcare-associated pneumonia) 12/18/2018  . Acute metabolic encephalopathy 25/42/7062  . Macrocytic anemia 12/18/2018  . Acute respiratory failure with hypoxia ([place]) 12/18/2018  . Acute urinary retention   . Acute on chronic combined systolic and diastolic CHF (congestive heart failure) ([place])   . Hypothyroidism   . Real time reverse transcriptase PCR positive for severe acute respiratory syndrome coronavirus 2 (SARS-CoV-2) 11/25/2018  . Palliative care encounter   . MRSA bacteremia   . 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 AKI (acute kidney injury) ([place])   . Cerebral embolism with cerebral infarction 11/20/2018  . Endocarditis of mitral valve   . MSSA bacteremia 11/19/2018  . Septic embolism ([place])   . Pain of upper abdomen   . Postural dizziness with near syncope 11/13/2018  . Tachypnea   . Nausea and vomiting 11/12/2018  . Bilateral hydronephrosis 11/12/2018  . Elevated troponin 11/12/2018  . Lactic acidosis 11/12/2018  .  Chronic neck and back pain 11/12/2018  . Essential hypertension 11/12/2018  . Normal coronary arteries 09/19/2013  . LBBB (left bundle  branch block) 09/19/2013  . Congestive dilated cardiomyopathy (HCC) 07/24/2013    Palliative Care Plan    Recommendations/Plan:  DNR, but with full scope treatment.  Trial period of 3-5 days Rocephin to see if we begin to see positive changes.  Will add low dose trazodone for sleep in addition to ativan (patient on ativan PTA)  Feeding assistance, aspiration precautions  Delirium precautions - please bundle nursing activities at night to avoid waking patient as much as possible.  PMT meeting scheduled for Friday 13th.  Please call our office if needed sooner.  [phone]  Goals of Care and Additional Recommendations:  Limitations on Scope of Treatment: Full Scope Treatment  Code Status:  DNR  Prognosis:   Unable to determine.  Very concerning.  High risk for acute decline and possibly death given critical infection thus far not improving with antibiotic therapy.   Discharge Planning:  Skilled Nursing Facility for rehab with Palliative care service follow-up  Care plan was discussed with Drs Ok Edwards, and son Autumn [name]. Thank you for allowing the Palliative Medicine Team to assist in the care of this patient.  Total time spent:  75 min. Time in:  2:50 Time out:  4:05     Greater than 50%  of this time was spent counseling and coordinating care related to the above assessment and plan.  Norvel Richards, PA-C Palliative Medicine  Please contact Palliative MedicineTeam phone at [phone] for questions and concerns between 7 am - 7 pm.   Please see AMION for individual provider pager numbers.

## 2018-12-23 NOTE — TOC Progression Note (Signed)
Transition of Care [facility]) - Progression Note    Patient Details  Name: Autumn [name] MRN: [id number] Date of Birth: 13-Apr-1932  Transition of Care [facility]) CM/SW [place], [place] Phone Number: 12/23/2018, 10:53 AM  Clinical Narrative:    Pt from [facility], TOC team continues to follow for medical stability in order to re-initiate insurance approval.     Expected Discharge Plan: Skilled Nursing Facility Barriers to Discharge: Ship broker, Continued Medical Work up  Expected Discharge Plan and Services Expected Discharge Plan: [place] In-house Referral: Clinical Social Work, Hospice / Palliative Care Discharge Planning Services: CM Consult Post Acute Care Choice: Resumption of Product/process development scientist, [place] Living arrangements for the past 2 months: Single Family Home, [place]   Social Determinants of Health (SDOH) Interventions    Readmission Risk Interventions Readmission Risk Prevention Plan 12/21/2018  Transportation Screening Complete  PCP or Specialist Appt within 3-5 Days Not Complete  Not Complete comments at this time likely plan for return to SNF  [facility] or  Not Complete  HRI or Home Care Consult comments at this time likely plan for return to SNF  Social Work Consult for [place] Planning/Counseling Complete  Palliative Care Screening Complete  Medication Review Press photographer) Complete  Some recent data might be hidden

## 2018-12-23 NOTE — Progress Notes (Signed)
[facility]   Reason for visit: Follow up on MSSA bacteremia  Interval History: blood cultures have remained negative.  Now with AKI and creat up to 3.27.  AFebrile. WBC remains up at 16.2.  No acute events.  No associated rash. Complaint of no sleep for 2 days.    Physical Exam: Constitutional:  Vitals:   12/23/18 0812 12/23/18 0952  BP: 140/63   Pulse: 93   Resp: (!) 22   Temp:  (!) 97.5 F (36.4 C)  SpO2: 92%    patient appears in NAD Eyes: anicteric Respiratory: Normal respiratory effort; CTA B Cardiovascular: RRR GI: soft, nt, nd Neuro: some expressive aphasia  Review of Systems: Constitutional: negative for fevers and chills Gastrointestinal: negative for nausea and diarrhea Integument/breast: negative for rash  Lab Results  Component Value Date   WBC 16.2 (H) 12/23/2018   HGB 10.1 (L) 12/23/2018   HCT 31.0 (L) 12/23/2018   MCV 98.1 12/23/2018   PLT 305 12/23/2018    Lab Results  Component Value Date   CREATININE 3.27 (H) 12/23/2018   BUN 34 (H) 12/23/2018   NA 134 (L) 12/23/2018   K 4.1 12/23/2018   CL 100 12/23/2018   CO2 21 (L) 12/23/2018    Lab Results  Component Value Date   ALT 28 12/20/2018   AST 33 12/20/2018   ALKPHOS 113 12/20/2018     Microbiology: Recent Results (from the past 240 hour(s))  Blood Culture (routine x 2)     Status: None   Collection Time: 12/18/18 10:47 AM   Specimen: BLOOD RIGHT ARM  Result Value Ref Range Status   Specimen Description BLOOD RIGHT ARM  Final   Special Requests   Final    BOTTLES DRAWN AEROBIC AND ANAEROBIC Blood Culture adequate volume   Culture   Final    NO GROWTH 5 DAYS Performed at [facility] [place], [address], [place], [place] [address]    Report Status 12/23/2018 FINAL  Final  SARS Coronavirus 2 by RT PCR (hospital order, performed in [facility] hospital lab) Nasopharyngeal Nasopharyngeal Swab     Status: None   Collection Time: 12/18/18 12:13 PM   Specimen: Nasopharyngeal Swab  Result Value Ref Range Status   SARS Coronavirus 2 NEGATIVE NEGATIVE Final    Comment: (NOTE) If result is NEGATIVE SARS-CoV-2 target nucleic acids are NOT DETECTED. The SARS-CoV-2 RNA is generally detectable in upper and lower  respiratory specimens during the acute phase of infection. The lowest  concentration of SARS-CoV-2 viral copies this assay can detect is 250  copies / mL. A negative result does not preclude SARS-CoV-2 infection  and should not be used as the sole basis for treatment or other  patient management decisions.  A negative result may occur with  improper specimen collection / handling, submission of specimen other  than nasopharyngeal swab, presence of viral mutation(s) within the  areas targeted by this assay, and inadequate number of viral copies  (<250 copies / mL). A negative result must be combined with clinical  observations, patient history, and epidemiological information. If result is POSITIVE SARS-CoV-2 target nucleic acids are DETECTED. The SARS-CoV-2 RNA is generally detectable in upper and lower  respiratory specimens dur ing the acute phase of infection.  Positive  results are indicative of active infection with SARS-CoV-2.  Clinical  correlation with patient history and other diagnostic information is  necessary to determine patient infection status.  Positive results do  not rule out bacterial infection or co-infection with other viruses. If result is PRESUMPTIVE POSTIVE SARS-CoV-2 nucleic acids MAY BE PRESENT.   A presumptive positive result was obtained on the submitted specimen  and confirmed on repeat testing.  While 2019 novel coronavirus  (SARS-CoV-2) nucleic acids may be present in the submitted sample  additional confirmatory testing may be necessary for epidemiological  and / or clinical management purposes  to differentiate between  SARS-CoV-2 and other Sarbecovirus currently known to infect humans.  If  clinically indicated additional testing with an alternate test  methodology [phone]) is advised. The SARS-CoV-2 RNA is generally  detectable in upper and lower respiratory sp ecimens during the acute  phase of infection. The expected result is Negative. Fact Sheet for Patients:  StrictlyIdeas.no Fact Sheet for Healthcare Providers: BankingDealers.co.za This test is not yet approved or cleared by the [place] FDA and has been authorized for detection and/or diagnosis of SARS-CoV-2 by FDA under an Emergency Use Authorization (EUA).  This EUA will remain in effect (meaning this test can be used) for the duration of the COVID-19 declaration under Section 564(b)(1) of the Act, 21 U.S.C. section 360bbb-3(b)(1), unless the authorization is terminated or revoked sooner. Performed at [facility], [place] [address], [place], [place] [address]   Blood Culture (routine x 2)     Status: None   Collection Time: 12/18/18 12:31 PM   Specimen: BLOOD RIGHT HAND  Result Value Ref Range Status   Specimen Description BLOOD RIGHT HAND  Final   Special Requests   Final    BOTTLES DRAWN AEROBIC ONLY Blood Culture results may not be optimal due to an inadequate volume of blood received in culture bottles   Culture   Final    NO GROWTH 5 DAYS Performed at [facility], [place] [address] [place], [place] [address]    Report Status 12/23/2018 FINAL  Final  Urine culture     Status: Abnormal   Collection Time: 12/18/18  3:26 PM   Specimen: In/Out Cath Urine  Result Value Ref Range Status   Specimen Description IN/OUT CATH URINE  Final   Special Requests   Final    NONE Performed at [facility], [place] [address], [place], [place] [address]    Culture 20,000 COLONIES/mL YEAST (A)  Final   Report Status 12/19/2018 FINAL  Final  MRSA PCR Screening     Status: None   Collection Time: 12/20/18 12:50 AM   Specimen: Nasal Mucosa; Nasopharyngeal   Result Value Ref Range Status   MRSA by PCR NEGATIVE NEGATIVE Final    Comment:        The GeneXpert MRSA Assay (FDA approved for NASAL specimens only), is one component of a comprehensive MRSA colonization surveillance program. It is not intended to diagnose MRSA infection nor to guide or monitor treatment for MRSA infections. Performed at [facility], [place] [address] [place], [place] [address]     Impression/Plan:  1. Discitis/osteomyelitis with epidural abscess - Continues to have back pain.  Has been on nafcillin with now with AKI.  I will change her back to ceftriaxone, high dose.  2.  AKI - creat up to 3.27.  Nafcillin toxicity possible.  This has been stopped.    3.  Mitral valve vegetation - on antibiotics as above.   Will need a prolonged course.  4.  Embolic stroke - some aphasia.  Likely secondary to MSSA embolic infarct from MV vegetation.  On antibiotics and will continue.

## 2018-12-23 NOTE — Progress Notes (Signed)
Patient ID: Autumn [name], female   DOB: [birth year], 85 y.o.   MRN: [id number]  PROGRESS NOTE    Autumn [name]  INO:[id number] DOB: [birth year] DOA: 12/18/2018 PCP: Merlene Laughter, MD   Brief Narrative:  85 year old female with history of hypertension, chronic systolic CHF, hypothyroidism, left bundle branch block, peripheral neuropathy, admission from 11/12/2018-11/26/2018 during which she was treated for acute ischemic left cerebellar CVA, MSSA bacteremia with signs of posterior mitral valve vegetation consistent with endocarditis with TEE on 11/20/2018 as well as acute and chronic CHF with EF decrease down to 25% during which time patient had declined aggressive work-up by cardiology and gastroenterology and she was treated with Rocephin with plan to complete intravenous Rocephin till 12/31/2018 for endocarditis as per ID recommendations and was discharged to rehab facility with indwelling Foley catheter.  Since the stroke, patient has basically been bedbound.  She presented on 12/18/2018 with altered mental status from rehab facility and was found to be febrile.  She was admitted for sepsis secondary to healthcare associated pneumonia and started on broad-spectrum antibiotics.  She was also found to have pulmonary emboli and started on heparin drip.  She was also found to have new strokes and L1-L2 discitis/osteomyelitis with possible epidural abscess and bilateral psoas abscesses.  Neurology/neurosurgery and ID were consulted.  Assessment & Plan:   Sepsis: Present on admission Probable healthcare associated pneumonia with concern for gram-negative rods versus MRSA pneumonia History of recent MSSA bacteremia and probable mitral valve endocarditis -Patient was on Rocephin prior to presentation till 12/31/2018 as per ID recommendations.  PICC line was discontinued on presentation. -SLP evaluation. -Hemodynamically improving.  Off IV fluids. -Follow cultures.  COVID-19 testing negative. -Initially  started on broad-spectrum antibiotics which were switched to IV nafcillin on 12/20/2018 by ID.  Acute L1-L2 discitis/osteomyelitis with possible epidural abscess and bilateral psoas abscesses Back pain -MRI of thoracolumbar spine showed findings as above.  Neurosurgery was consulted.  Neurosurgery recommends conservative management with antibiotics for now as per ID recommendations. -ID evaluation appreciated: Currently on IV nafcillin.  Will hold off on IR evaluation for now.  Patient will probably need 6 to 8 weeks of antibiotics.  Acute kidney injury  -Creatinine has bumped up to 3.27.  Oral intake is poor.  Patient also has been switched to nafcillin.  Continue IV fluids.  Monitor strict input and output.  Will DC nafcillin because of worsening renal function and mental status.  Will start Ancef.  Leukocytosis -Persistent.  Monitor  Acute hypoxic respiratory failure -Required supplemental oxygen on presentation.  Intermittently requiring  supplemental oxygen. incentive spirometry.  Pulmonary emboli -CT angiogram of the chest revealed subtle pulmonary emboli of the right and left lungs with mild right heart strain. -Continue Eliquis.  Neurology has given clearance for the same. -Echo showed EF of 55 to 60% -Lower extremity duplex ultrasound was negative for DVT.  New acute ischemic strokes in a patient with history of recent ischemic left cerebellar CVA Acute metabolic encephalopathy -MRI of the brain on 12/19/2018 showed new areas of acute infarct.  Neurology was consulted.  CTA of the head and neck was stable as compared to recent imaging. -LDL 89.  A1c 5.9. -Continue statin.  Continue Eliquis.  Outpatient follow-up with neurology -Diet as per SLP recommendations  Combined systolic and diastolic heart failure -Currently euvolemic.  Last EF was noted to be around 25%.  Limited echo done yesterday showed EF of 55 to 60%.  Strict input and output.  Daily weights.  Patient was given 1  dose of Lasix intravenously on 12/20/2018.  Fluid restriction.  IV fluid as above.  Delirium -Delirium precautions.  Use Haldol if extremely agitated. -Patient has been intermittently pulling out peripheral IVs. -Her mental status has worsened since starting nafcillin.  Will DC nafcillin.  Hypokalemia -Improved.  Hypomagnesemia -Improved.  Hypoalbuminemia -Probably from very poor oral intake.  Follow nutrition recommendations.  Anemia of chronic disease -Probably from above.  Status post 1 unit packed red cell transfusion on 12/18/2018.  H&H stable.  Generalized deconditioning -PT/OT eval.  Overall prognosis is guarded to poor.  Palliative care following. -Her overall condition has significantly deteriorated over the last couple of days.  If condition does not improve in the next 24 to 48 hours, consider hospice/comfort measures.  DVT prophylaxis: Heparin Code Status: DNR Family Communication: Spoke to son/Kurt on phone on 12/23/2018 Disposition Plan: SNF once clinically improved  Consultants: ID/palliative care Neurosurgery/neurology  Procedures:  Echo   1. Left ventricular ejection fraction, by visual estimation, is 55 to 60%. The left ventricle has normal function. There is mildly increased left ventricular hypertrophy.  2. Global right ventricle has normal systolic function.The right ventricular size is normal. No increase in right ventricular wall thickness.  3. Left atrium is mildly dialted.  4. The mitral valve is abnormal. Mild mitral valve regurgitation. There is moderate calcification of the mitral valve leaflets. I do not see an obvious vegetation but image qualite is poor.  5. Right atrial size was normal.  6. The aortic valve was not well visualized. Aortic valve regurgitation is not visualized.  7. The pulmonic valve was not well visualized. Pulmonic valve regurgitation is not visualized.  8. Abnormal septal motion consistent with left bundle branch block.  9.  The inferior vena cava is normal in size with greater than 50% respiratory variability, suggesting right atrial pressure of 3 mmHg. 10. Limited echo with poor images. 11. Moderate calcification of the mitral valve leaflet(s). 12. The tricuspid valve is normal in structure. Tricuspid valve regurgitation is trivial.  Antimicrobials:  Vancomycin and cefepime from 12/18/2018-12/20/2018 Nafcillin from 12/20/2018 onwards  Subjective: Patient seen and examined at bedside.  Very poor historian.  More awake this morning and states that she does not feel well and that she is hurting all over including her back.  As per nursing staff, patient has had episodes of confusion and very poor oral intake.  No overnight fever or vomiting reported   objective: Vitals:   12/22/18 2020 12/23/18 0019 12/23/18 0420 12/23/18 0500  BP: (!) 106/41 (!) 99/39 (!) 133/50   Pulse: 83 91 90   Resp: 15 16 (!) 21   Temp: 97.8 F (36.6 C)     TempSrc: Oral     SpO2: 95% 96% 93%   Weight:    61 kg  Height:        Intake/Output Summary (Last 24 hours) at 12/23/2018 0801 Last data filed at 12/23/2018 0600 Gross per 24 hour  Intake 1939.04 ml  Output 300 ml  Net 1639.04 ml   Filed Weights   12/21/18 0341 12/22/18 0500 12/23/18 0500  Weight: 62.4 kg 60.3 kg 61 kg    Examination:  General exam: No distress.  Elderly female lying in bed.  Very poor historian.  Sleepy, hardly wakes up on calling her name.  Looks extremely deconditioned ENT: No elevated JVD Respiratory system: Bilateral decreased breath sounds at bases with basilar crackles  cardiovascular system: S1-S2 heard, rate controlled Gastrointestinal system: Abdomen  is nondistended, soft and nontender. Normal bowel sounds heard. Extremities: No cyanosis; trace lower extremity edema.  Left upper extremity swollen and erythematous. Central nervous system: Awake and answers some questions.  Poor historian.  No focal  neurological deficits. Moving extremities  Psychiatry: Could not be assessed because of mental status.     Data Reviewed: I have personally reviewed following labs and imaging studies  CBC: Recent Labs  Lab 12/18/18 1056  12/19/18 0624 12/20/18 0522 12/21/18 0224 12/22/18 0330 12/23/18 0307  WBC 8.7  --  11.6* 12.5* 16.6* 15.5* 16.2*  NEUTROABS 5.6  --   --   --   --   --  8.2*  HGB 7.9*   < > 10.0* 10.8* 12.0 10.3* 10.1*  HCT 24.9*   < > 30.7* 32.6* 35.1* 31.7* 31.0*  MCV 105.1*  --  96.2 96.2 94.4 97.2 98.1  PLT 261  --  282 274 283 272 305   < > = values in this interval not displayed.   Basic Metabolic Panel: Recent Labs  Lab 12/19/18 0624 12/20/18 0522 12/21/18 0224 12/22/18 0330 12/23/18 0307  NA 132* 131* 131* 132* 134*  K 3.9 3.8 3.3* 3.8 4.1  CL 100 101 96* 100 100  CO2 22 20* 22 21* 21*  GLUCOSE 98 107* 118* 181* 137*  BUN 10 6* 10 25* 34*  CREATININE 0.75 0.78 1.03* 2.49* 3.27*  CALCIUM 8.2* 8.0* 7.8* 7.6* 7.3*  MG  --  1.7 1.5* 2.5* 2.4   GFR: Estimated Creatinine Clearance: 10.6 mL/min (A) (by C-G formula based on SCr of 3.27 mg/dL (H)). Liver Function Tests: Recent Labs  Lab 12/18/18 1056 12/20/18 0522  AST 30 33  ALT 19 28  ALKPHOS 105 113  BILITOT 0.1* 0.5  PROT 4.6* 5.1*  ALBUMIN 1.6* 1.6*   No results for input(s): LIPASE, AMYLASE in the last 168 hours. No results for input(s): AMMONIA in the last 168 hours. Coagulation Profile: Recent Labs  Lab 12/18/18 1056  INR 1.2   Cardiac Enzymes: No results for input(s): CKTOTAL, CKMB, CKMBINDEX, TROPONINI in the last 168 hours. BNP (last 3 results) No results for input(s): PROBNP in the last 8760 hours. HbA1C: No results for input(s): HGBA1C in the last 72 hours. CBG: No results for input(s): GLUCAP in the last 168 hours. Lipid Profile: No results for input(s): CHOL, HDL, LDLCALC, TRIG, CHOLHDL, LDLDIRECT in the last 72 hours. Thyroid Function Tests: No results for input(s): TSH, T4TOTAL, FREET4, T3FREE, THYROIDAB in the  last 72 hours. Anemia Panel: No results for input(s): VITAMINB12, FOLATE, FERRITIN, TIBC, IRON, RETICCTPCT in the last 72 hours. Sepsis Labs: Recent Labs  Lab 12/18/18 1056 12/18/18 1244  PROCALCITON 0.13  --   LATICACIDVEN 2.0* 1.4    Recent Results (from the past 240 hour(s))  Blood Culture (routine x 2)     Status: None (Preliminary result)   Collection Time: 12/18/18 10:47 AM   Specimen: BLOOD RIGHT ARM  Result Value Ref Range Status   Specimen Description BLOOD RIGHT ARM  Final   Special Requests   Final    BOTTLES DRAWN AEROBIC AND ANAEROBIC Blood Culture adequate volume   Culture   Final    NO GROWTH 4 DAYS Performed at [facility], [address], [place], [place] [address]    Report Status PENDING  Incomplete  SARS Coronavirus 2 by RT PCR (hospital order, performed in [facility] hospital lab) Nasopharyngeal Nasopharyngeal Swab     Status: None  Collection Time: 12/18/18 12:13 PM   Specimen: Nasopharyngeal Swab  Result Value Ref Range Status   SARS Coronavirus 2 NEGATIVE NEGATIVE Final    Comment: (NOTE) If result is NEGATIVE SARS-CoV-2 target nucleic acids are NOT DETECTED. The SARS-CoV-2 RNA is generally detectable in upper and lower  respiratory specimens during the acute phase of infection. The lowest  concentration of SARS-CoV-2 viral copies this assay can detect is 250  copies / mL. A negative result does not preclude SARS-CoV-2 infection  and should not be used as the sole basis for treatment or other  patient management decisions.  A negative result may occur with  improper specimen collection / handling, submission of specimen other  than nasopharyngeal swab, presence of viral mutation(s) within the  areas targeted by this assay, and inadequate number of viral copies  (<250 copies / mL). A negative result must be combined with clinical  observations, patient history, and epidemiological information. If result is POSITIVE SARS-CoV-2 target nucleic  acids are DETECTED. The SARS-CoV-2 RNA is generally detectable in upper and lower  respiratory specimens dur ing the acute phase of infection.  Positive  results are indicative of active infection with SARS-CoV-2.  Clinical  correlation with patient history and other diagnostic information is  necessary to determine patient infection status.  Positive results do  not rule out bacterial infection or co-infection with other viruses. If result is PRESUMPTIVE POSTIVE SARS-CoV-2 nucleic acids MAY BE PRESENT.   A presumptive positive result was obtained on the submitted specimen  and confirmed on repeat testing.  While 2019 novel coronavirus  (SARS-CoV-2) nucleic acids may be present in the submitted sample  additional confirmatory testing may be necessary for epidemiological  and / or clinical management purposes  to differentiate between  SARS-CoV-2 and other Sarbecovirus currently known to infect humans.  If clinically indicated additional testing with an alternate test  methodology [phone]([id number]) is advised. The SARS-CoV-2 RNA is generally  detectable in upper and lower respiratory sp ecimens during the acute  phase of infection. The expected result is Negative. Fact Sheet for Patients:  BoilerBrush.com.cyhttps://www.fda.gov/media/136312/download Fact Sheet for Healthcare Providers: https://pope.com/https://www.fda.gov/media/136313/download This test is not yet approved or cleared by the [place] FDA and has been authorized for detection and/or diagnosis of SARS-CoV-2 by FDA under an Emergency Use Authorization (EUA).  This EUA will remain in effect (meaning this test can be used) for the duration of the COVID-19 declaration under Section 564(b)(1) of the Act, 21 U.S.C. section 360bbb-3(b)(1), unless the authorization is terminated or revoked sooner. Performed at [facility], [address] [place], [place] [address]   Blood Culture (routine x 2)     Status: None (Preliminary result)   Collection Time:  12/18/18 12:31 PM   Specimen: BLOOD RIGHT HAND  Result Value Ref Range Status   Specimen Description BLOOD RIGHT HAND  Final   Special Requests   Final    BOTTLES DRAWN AEROBIC ONLY Blood Culture results may not be optimal due to an inadequate volume of blood received in culture bottles   Culture   Final    NO GROWTH 4 DAYS Performed at [facility], [address] [place], [place] [address]    Report Status PENDING  Incomplete  Urine culture     Status: Abnormal   Collection Time: 12/18/18  3:26 PM   Specimen: In/Out Cath Urine  Result Value Ref Range Status   Specimen Description IN/OUT CATH URINE  Final   Special Requests  Final    NONE Performed at [facility], [place] [address] [place], [place] [address]    Culture 20,000 COLONIES/mL YEAST (A)  Final   Report Status 12/19/2018 FINAL  Final  MRSA PCR Screening     Status: None   Collection Time: 12/20/18 12:50 AM   Specimen: Nasal Mucosa; Nasopharyngeal  Result Value Ref Range Status   MRSA by PCR NEGATIVE NEGATIVE Final    Comment:        The GeneXpert MRSA Assay (FDA approved for NASAL specimens only), is one component of a comprehensive MRSA colonization surveillance program. It is not intended to diagnose MRSA infection nor to guide or monitor treatment for MRSA infections. Performed at [facility], [place] [address] [place], [place] [address]          Radiology Studies: No results found.      Scheduled Meds: . apixaban  10 mg Oral BID   Followed by  . [START ON 12/28/2018] apixaban  5 mg Oral BID  . atorvastatin  20 mg Oral QHS  . DULoxetine  30 mg Oral Daily  . feeding supplement (ENSURE ENLIVE)  237 mL Oral TID BM  . multivitamin with minerals  1 tablet Oral Daily  . sodium chloride flush  3 mL Intravenous Q12H   Continuous Infusions: . sodium chloride 75 mL/hr at 12/23/18 0600  . nafcillin (NAFCIL) continuous infusion 20.8 mL/hr at 12/23/18 0600  . sodium chloride Stopped  (12/22/18 2143)          Aline August, MD [facility] 12/23/2018, 8:01 AM

## 2018-12-24 LAB — CBC WITH DIFFERENTIAL/PLATELET
Abs Immature Granulocytes: 0.16 10*3/uL — ABNORMAL HIGH (ref 0.00–0.07)
Basophils Absolute: 0.1 10*3/uL (ref 0.0–0.1)
Basophils Relative: 0 %
Eosinophils Absolute: 1.8 10*3/uL — ABNORMAL HIGH (ref 0.0–0.5)
Eosinophils Relative: 10 %
HCT: 33.5 % — ABNORMAL LOW (ref 36.0–46.0)
Hemoglobin: 11.1 g/dL — ABNORMAL LOW (ref 12.0–15.0)
Immature Granulocytes: 1 %
Lymphocytes Relative: 7 %
Lymphs Abs: 1.2 10*3/uL (ref 0.7–4.0)
MCH: 32 pg (ref 26.0–34.0)
MCHC: 33.1 g/dL (ref 30.0–36.0)
MCV: 96.5 fL (ref 80.0–100.0)
Monocytes Absolute: 0.8 10*3/uL (ref 0.1–1.0)
Monocytes Relative: 5 %
Neutro Abs: 13 10*3/uL — ABNORMAL HIGH (ref 1.7–7.7)
Neutrophils Relative %: 77 %
Platelets: 313 10*3/uL (ref 150–400)
RBC: 3.47 MIL/uL — ABNORMAL LOW (ref 3.87–5.11)
RDW: 16.6 % — ABNORMAL HIGH (ref 11.5–15.5)
WBC: 17 10*3/uL — ABNORMAL HIGH (ref 4.0–10.5)
nRBC: 0 % (ref 0.0–0.2)

## 2018-12-24 LAB — BASIC METABOLIC PANEL
Anion gap: 13 (ref 5–15)
BUN: 34 mg/dL — ABNORMAL HIGH (ref 8–23)
CO2: 19 mmol/L — ABNORMAL LOW (ref 22–32)
Calcium: 7.9 mg/dL — ABNORMAL LOW (ref 8.9–10.3)
Chloride: 102 mmol/L (ref 98–111)
Creatinine, Ser: 3.21 mg/dL — ABNORMAL HIGH (ref 0.44–1.00)
GFR calc Af Amer: 15 mL/min — ABNORMAL LOW (ref >60)
GFR calc non Af Amer: 13 mL/min — ABNORMAL LOW (ref >60)
Glucose, Bld: 117 mg/dL — ABNORMAL HIGH (ref 70–99)
Potassium: 4.5 mmol/L (ref 3.5–5.1)
Sodium: 134 mmol/L — ABNORMAL LOW (ref 135–145)

## 2018-12-24 LAB — PATHOLOGIST SMEAR REVIEW: Path Review: INCREASED

## 2018-12-24 LAB — MAGNESIUM: Magnesium: 2.1 mg/dL (ref 1.7–2.4)

## 2018-12-24 MED ORDER — MAGIC MOUTHWASH
5.0000 mL | Freq: Four times a day (QID) | ORAL | Status: DC | PRN
  Administered 2018-12-24 – 2018-12-25 (×2): 5 mL via ORAL
  Filled 2018-12-24 (×4): qty 5

## 2018-12-24 MED ORDER — OXYCODONE HCL 5 MG PO TABS
5.0000 mg | ORAL_TABLET | ORAL | Status: DC | PRN
  Administered 2018-12-25 – 2018-12-29 (×9): 5 mg via ORAL
  Filled 2018-12-24 (×9): qty 1

## 2018-12-24 NOTE — Progress Notes (Signed)
Patient ID: Autumn [name], female   DOB: 1932/05/30, 85 y.o.   MRN: [id number]  PROGRESS NOTE    SANAAI DOANE  XVQ:[id number] DOB: [birth year] DOA: 12/18/2018 PCP: Merlene Laughter, MD   Brief Narrative:  85 year old female with history of hypertension, chronic systolic CHF, hypothyroidism, left bundle branch block, peripheral neuropathy, admission from 11/12/2018-11/26/2018 during which she was treated for acute ischemic left cerebellar CVA, MSSA bacteremia with signs of posterior mitral valve vegetation consistent with endocarditis with TEE on 11/20/2018 as well as acute and chronic CHF with EF decrease down to 25% during which time patient had declined aggressive work-up by cardiology and gastroenterology and she was treated with Rocephin with plan to complete intravenous Rocephin till 12/31/2018 for endocarditis as per ID recommendations and was discharged to rehab facility with indwelling Foley catheter.  Since the stroke, patient has basically been bedbound.  She presented on 12/18/2018 with altered mental status from rehab facility and was found to be febrile.  She was admitted for sepsis secondary to healthcare associated pneumonia and started on broad-spectrum antibiotics.  She was also found to have pulmonary emboli and started on heparin drip.  She was also found to have new strokes and L1-L2 discitis/osteomyelitis with possible epidural abscess and bilateral psoas abscesses.  Neurology/neurosurgery and ID were consulted.  Assessment & Plan:   Sepsis: Present on admission Probable healthcare associated pneumonia with concern for gram-negative rods versus MRSA pneumonia History of recent MSSA bacteremia and probable mitral valve endocarditis -Patient was on Rocephin prior to presentation till 12/31/2018 as per ID recommendations.  PICC line was discontinued on presentation. -SLP evaluation. -Hemodynamically improving.  Off IV fluids. -Follow cultures.  COVID-19 testing negative. -Initially  started on broad-spectrum antibiotics which were switched to IV nafcillin on 12/20/2018 by ID.  Patient was switched back to Rocephin on 12/23/2018 by ID because of renal failure and neurological changes.  Acute L1-L2 discitis/osteomyelitis with possible epidural abscess and bilateral psoas abscesses Back pain -MRI of thoracolumbar spine showed findings as above.  Neurosurgery was consulted.  Neurosurgery recommends conservative management with antibiotics for now as per ID recommendations. -ID following:  Will hold off on IR evaluation for now.  Patient will probably need 6 to 8 weeks of antibiotics.  ID switched her nafcillin back to Rocephin on 12/23/2018. -Patient is complaining of increasing pain.  Currently on tramadol as needed.  Will also add oxycodone as needed for severe pain.  Acute kidney injury  -Creatinine has bumped up to 3.21.  Oral intake is poor.  Patient also has been switched to nafcillin.  Continue IV fluids.  Monitor strict input and output.  Will DC nafcillin because of worsening renal function and mental status.  Will start Ancef.  Leukocytosis -Persistent.  Monitor  Acute hypoxic respiratory failure -Required supplemental oxygen on presentation.  Intermittently requiring  supplemental oxygen. incentive spirometry.  Pulmonary emboli -CT angiogram of the chest revealed subtle pulmonary emboli of the right and left lungs with mild right heart strain. -Continue Eliquis.  Neurology has given clearance for the same. -Echo showed EF of 55 to 60% -Lower extremity duplex ultrasound was negative for DVT.  New acute ischemic strokes in a patient with history of recent ischemic left cerebellar CVA Acute metabolic encephalopathy -MRI of the brain on 12/19/2018 showed new areas of acute infarct.  Neurology was consulted.  CTA of the head and neck was stable as compared to recent imaging. -LDL 89.  A1c 5.9. -Continue statin.  Continue Eliquis.  Outpatient follow-up  with neurology  -Diet as per SLP recommendations  Combined systolic and diastolic heart failure -Currently euvolemic.  Last EF was noted to be around 25%.  Limited echo done yesterday showed EF of 55 to 60%.  Strict input and output.  Daily weights.  Patient was given 1 dose of Lasix intravenously on 12/20/2018.  Fluid restriction.  IV fluid as above.  Delirium -Delirium precautions.  Use Haldol if extremely agitated. -Patient has been intermittently pulling out peripheral IVs. -Her mental status has worsened since starting nafcillin.  Nafcillin was DC'd on 12/23/2018.  Hypokalemia -Improved.  Hypomagnesemia -Improved.  Hypoalbuminemia -Probably from very poor oral intake.  Follow nutrition recommendations.  Anemia of chronic disease -Probably from above.  Status post 1 unit packed red cell transfusion on 12/18/2018.  H&H stable.  Generalized deconditioning -PT/OT eval.  Overall prognosis is guarded to poor.  Palliative care following. -Her overall condition has significantly deteriorated over the last couple of days.  If condition does not improve in the next 24 to 48 hours, consider hospice/comfort measures.  DVT prophylaxis: Eliquis  code Status: DNR Family Communication: Spoke to son/Kurt on phone on 12/24/2018 Disposition Plan: SNF once clinically improved  Consultants: ID/palliative care Neurosurgery/neurology  Procedures:  Echo   1. Left ventricular ejection fraction, by visual estimation, is 55 to 60%. The left ventricle has normal function. There is mildly increased left ventricular hypertrophy.  2. Global right ventricle has normal systolic function.The right ventricular size is normal. No increase in right ventricular wall thickness.  3. Left atrium is mildly dialted.  4. The mitral valve is abnormal. Mild mitral valve regurgitation. There is moderate calcification of the mitral valve leaflets. I do not see an obvious vegetation but image qualite is poor.  5. Right atrial size was  normal.  6. The aortic valve was not well visualized. Aortic valve regurgitation is not visualized.  7. The pulmonic valve was not well visualized. Pulmonic valve regurgitation is not visualized.  8. Abnormal septal motion consistent with left bundle branch block.  9. The inferior vena cava is normal in size with greater than 50% respiratory variability, suggesting right atrial pressure of 3 mmHg. 10. Limited echo with poor images. 11. Moderate calcification of the mitral valve leaflet(s). 12. The tricuspid valve is normal in structure. Tricuspid valve regurgitation is trivial.  Antimicrobials:  Vancomycin and cefepime from 12/18/2018-12/20/2018 Nafcillin from 12/20/2018-12/23/2018 Rocephin from 12/23/2018 onwards  Subjective: Patient seen and examined at bedside.  Very poor historian.  Awake but slightly confused.  As per nursing staff, patient has been intermittently confused with very poor oral intake.  objective: Vitals:   12/24/18 0408 12/24/18 0409 12/24/18 0616 12/24/18 0744  BP: 107/63   (!) 130/55  Pulse: (!) 114   (!) 108  Resp: (!) 27  19 20   Temp: 98.3 F (36.8 C)   99.5 F (37.5 C)  TempSrc: Axillary   Axillary  SpO2: 92%   95%  Weight:  72 kg    Height:        Intake/Output Summary (Last 24 hours) at 12/24/2018 0757 Last data filed at 12/24/2018 0723 Gross per 24 hour  Intake 1580 ml  Output 1375 ml  Net 205 ml   Filed Weights   12/22/18 0500 12/23/18 0500 12/24/18 0409  Weight: 60.3 kg 61 kg 72 kg    Examination:  General exam: No acute distress.  Elderly female lying in bed.  Very poor historian.  Sleepy, hardly wakes up on calling her name.  Looks extremely deconditioned ENT: No elevation of JVD Respiratory system: Bilateral decreased breath sounds at bases with scattered crackles.  No wheezing  cardiovascular system: Rate controlled, S1-S2 heard Gastrointestinal system: Abdomen is nondistended, soft and nontender. Normal bowel sounds heard. Extremities:  No cyanosis; trace lower extremity edema.  Left upper extremity swollen and erythematous. Central nervous system: Intermittently confused.  Poor historian.  No focal  neurological deficits. Moving extremities Psychiatry: Could not be assessed because of mental status.     Data Reviewed: I have personally reviewed following labs and imaging studies  CBC: Recent Labs  Lab 12/18/18 1056  12/20/18 0522 12/21/18 0224 12/22/18 0330 12/23/18 0307 12/24/18 0252  WBC 8.7   < > 12.5* 16.6* 15.5* 16.2* 17.0*  NEUTROABS 5.6  --   --   --   --  8.2* 13.0*  HGB 7.9*   < > 10.8* 12.0 10.3* 10.1* 11.1*  HCT 24.9*   < > 32.6* 35.1* 31.7* 31.0* 33.5*  MCV 105.1*   < > 96.2 94.4 97.2 98.1 96.5  PLT 261   < > 274 283 272 305 313   < > = values in this interval not displayed.   Basic Metabolic Panel: Recent Labs  Lab 12/20/18 0522 12/21/18 0224 12/22/18 0330 12/23/18 0307 12/24/18 0252  NA 131* 131* 132* 134* 134*  K 3.8 3.3* 3.8 4.1 4.5  CL 101 96* 100 100 102  CO2 20* 22 21* 21* 19*  GLUCOSE 107* 118* 181* 137* 117*  BUN 6* 10 25* 34* 34*  CREATININE 0.78 1.03* 2.49* 3.27* 3.21*  CALCIUM 8.0* 7.8* 7.6* 7.3* 7.9*  MG 1.7 1.5* 2.5* 2.4 2.1   GFR: Estimated Creatinine Clearance: 12.4 mL/min (A) (by C-G formula based on SCr of 3.21 mg/dL (H)). Liver Function Tests: Recent Labs  Lab 12/18/18 1056 12/20/18 0522  AST 30 33  ALT 19 28  ALKPHOS 105 113  BILITOT 0.1* 0.5  PROT 4.6* 5.1*  ALBUMIN 1.6* 1.6*   No results for input(s): LIPASE, AMYLASE in the last 168 hours. No results for input(s): AMMONIA in the last 168 hours. Coagulation Profile: Recent Labs  Lab 12/18/18 1056  INR 1.2   Cardiac Enzymes: No results for input(s): CKTOTAL, CKMB, CKMBINDEX, TROPONINI in the last 168 hours. BNP (last 3 results) No results for input(s): PROBNP in the last 8760 hours. HbA1C: No results for input(s): HGBA1C in the last 72 hours. CBG: No results for input(s): GLUCAP in the last 168  hours. Lipid Profile: No results for input(s): CHOL, HDL, LDLCALC, TRIG, CHOLHDL, LDLDIRECT in the last 72 hours. Thyroid Function Tests: No results for input(s): TSH, T4TOTAL, FREET4, T3FREE, THYROIDAB in the last 72 hours. Anemia Panel: No results for input(s): VITAMINB12, FOLATE, FERRITIN, TIBC, IRON, RETICCTPCT in the last 72 hours. Sepsis Labs: Recent Labs  Lab 12/18/18 1056 12/18/18 1244  PROCALCITON 0.13  --   LATICACIDVEN 2.0* 1.4    Recent Results (from the past 240 hour(s))  Blood Culture (routine x 2)     Status: None   Collection Time: 12/18/18 10:47 AM   Specimen: BLOOD RIGHT ARM  Result Value Ref Range Status   Specimen Description BLOOD RIGHT ARM  Final   Special Requests   Final    BOTTLES DRAWN AEROBIC AND ANAEROBIC Blood Culture adequate volume   Culture   Final    NO GROWTH 5 DAYS Performed at [facility], [address], [place], [place] [address]    Report Status 12/23/2018 FINAL  Final  SARS Coronavirus 2 by RT PCR (hospital order, performed in [facility] hospital lab) Nasopharyngeal Nasopharyngeal Swab     Status: None   Collection Time: 12/18/18 12:13 PM   Specimen: Nasopharyngeal Swab  Result Value Ref Range Status   SARS Coronavirus 2 NEGATIVE NEGATIVE Final    Comment: (NOTE) If result is NEGATIVE SARS-CoV-2 target nucleic acids are NOT DETECTED. The SARS-CoV-2 RNA is generally detectable in upper and lower  respiratory specimens during the acute phase of infection. The lowest  concentration of SARS-CoV-2 viral copies this assay can detect is 250  copies / mL. A negative result does not preclude SARS-CoV-2 infection  and should not be used as the sole basis for treatment or other  patient management decisions.  A negative result may occur with  improper specimen collection / handling, submission of specimen other  than nasopharyngeal swab, presence of viral mutation(s) within the  areas targeted by this assay, and inadequate number of  viral copies  (<250 copies / mL). A negative result must be combined with clinical  observations, patient history, and epidemiological information. If result is POSITIVE SARS-CoV-2 target nucleic acids are DETECTED. The SARS-CoV-2 RNA is generally detectable in upper and lower  respiratory specimens dur ing the acute phase of infection.  Positive  results are indicative of active infection with SARS-CoV-2.  Clinical  correlation with patient history and other diagnostic information is  necessary to determine patient infection status.  Positive results do  not rule out bacterial infection or co-infection with other viruses. If result is PRESUMPTIVE POSTIVE SARS-CoV-2 nucleic acids MAY BE PRESENT.   A presumptive positive result was obtained on the submitted specimen  and confirmed on repeat testing.  While 2019 novel coronavirus  (SARS-CoV-2) nucleic acids may be present in the submitted sample  additional confirmatory testing may be necessary for epidemiological  and / or clinical management purposes  to differentiate between  SARS-CoV-2 and other Sarbecovirus currently known to infect humans.  If clinically indicated additional testing with an alternate test  methodology [phone]([id number]) is advised. The SARS-CoV-2 RNA is generally  detectable in upper and lower respiratory sp ecimens during the acute  phase of infection. The expected result is Negative. Fact Sheet for Patients:  BoilerBrush.com.cyhttps://www.fda.gov/media/136312/download Fact Sheet for Healthcare Providers: https://pope.com/https://www.fda.gov/media/136313/download This test is not yet approved or cleared by the [place] FDA and has been authorized for detection and/or diagnosis of SARS-CoV-2 by FDA under an Emergency Use Authorization (EUA).  This EUA will remain in effect (meaning this test can be used) for the duration of the COVID-19 declaration under Section 564(b)(1) of the Act, 21 U.S.C. section 360bbb-3(b)(1), unless the authorization is  terminated or revoked sooner. Performed at [facility], [address] [place], [place] [address]   Blood Culture (routine x 2)     Status: None   Collection Time: 12/18/18 12:31 PM   Specimen: BLOOD RIGHT HAND  Result Value Ref Range Status   Specimen Description BLOOD RIGHT HAND  Final   Special Requests   Final    BOTTLES DRAWN AEROBIC ONLY Blood Culture results may not be optimal due to an inadequate volume of blood received in culture bottles   Culture   Final    NO GROWTH 5 DAYS Performed at [facility], [address] [place], [place] [address]    Report Status 12/23/2018 FINAL  Final  Urine culture     Status: Abnormal   Collection Time: 12/18/18  3:26  PM   Specimen: In/Out Cath Urine  Result Value Ref Range Status   Specimen Description IN/OUT CATH URINE  Final   Special Requests   Final    NONE Performed at [facility], [address] [place], [place] [address]    Culture 20,000 COLONIES/mL YEAST (A)  Final   Report Status 12/19/2018 FINAL  Final  MRSA PCR Screening     Status: None   Collection Time: 12/20/18 12:50 AM   Specimen: Nasal Mucosa; Nasopharyngeal  Result Value Ref Range Status   MRSA by PCR NEGATIVE NEGATIVE Final    Comment:        The GeneXpert MRSA Assay (FDA approved for NASAL specimens only), is one component of a comprehensive MRSA colonization surveillance program. It is not intended to diagnose MRSA infection nor to guide or monitor treatment for MRSA infections. Performed at [facility], [address] [place], [place] [address]          Radiology Studies: No results found.      Scheduled Meds: . apixaban  10 mg Oral BID   Followed by  . [START ON 12/28/2018] apixaban  5 mg Oral BID  . atorvastatin  20 mg Oral QHS  . DULoxetine  30 mg Oral Daily  . feeding supplement (ENSURE ENLIVE)  237 mL Oral TID BM  . LORazepam  0.5 mg Intravenous QHS  . multivitamin with minerals  1 tablet Oral Daily   . sodium chloride flush  3 mL Intravenous Q12H  . traZODone  50 mg Oral QHS   Continuous Infusions: . sodium chloride 75 mL/hr at 12/23/18 1754  . cefTRIAXone (ROCEPHIN)  IV 2 g (12/23/18 2231)  . sodium chloride 250 mL (12/23/18 2228)          Glade LloydKshitiz Merilyn Pagan, MD [facility] 12/24/2018, 7:57 AM

## 2018-12-24 NOTE — Progress Notes (Signed)
 Occupational Therapy Treatment Patient Details Name: Autumn [name] MRN: [id number] DOB: [birth year] Today's Date: 12/24/2018    History of present illness 85 y.o. female with fairly complex recent past medical history including admission from 11/12/2018-11/26/2018 during which she was treated for acute ischemic left cerebellar CVA, MSSA bacteremia with signs of posterior mitral valve vegetation consistent with endocarditis with TEE on 11/20/2018 treated with IV Rocephin (last date tentatively 11/17) discharged to SNF, who presented 11/4 with altered mental status. She was found to be febrile and admitted for sepsis secondary to HCAP on vanc and cefepime, also found to have PE and started on heparin drip and new ischemic strokes. Today, she was complaining of severe lower back pain and an MRI was ordered revealed L1-L2 discitis osteomyelitis with associated epidural abscess   OT comments  Patient semi-supine upon arrival, patient reports feeling sleepy. Patient reports she has not been out of bed since last therapy session. With education and encouragement patient states she "should try" to move today. Patient requires max A with max verbal/tactile cues to sequence. Patient require min to moderate assistance and max tactile cues to correct for right lateral lean. Patient incontinent of stool, returned to supine after sitting edge of bed approx 8 minutes. Patient repositioned and nursing staff arrive to perform peri care.   Follow Up Recommendations  SNF;Supervision/Assistance - 24 hour    Equipment Recommendations  Other (comment)(to be determined)       Precautions / Restrictions Precautions Precautions: Fall Precaution Comments: severe back pain, decub right heel Required Braces or Orthoses: Spinal Brace(has been delivered, patient states has not helped much) Spinal Brace: Lumbar corset Restrictions Weight Bearing Restrictions: No       Mobility Bed Mobility Overal bed mobility: Needs  Assistance Bed Mobility: Rolling Rolling: Max assist Sidelying to sit: Max assist   Sit to supine: Max assist   General bed mobility comments: max verbal, tactile, visual cues for sequencing and to maintain sitting balance while seated at edge of bed  Transfers Overall transfer level: Needs assistance               General transfer comment: unable to perform transfer this session    Balance Overall balance assessment: Needs assistance Sitting-balance support: Bilateral upper extremity supported;Feet supported Sitting balance-Leahy Scale: Poor Sitting balance - Comments: tactile, visual, verbal cues for weight shifting  Postural control: Right lateral lean                                 ADL either performed or assessed with clinical judgement   ADL Overall ADL's : Needs assistance/impaired     Grooming: Total assistance;Sitting Grooming Details (indicate cue type and reason): due to limited sitting balance patient require total assistance to comb her hair                             Functional mobility during ADLs: Moderate assistance;Cueing for safety General ADL Comments: max to total A for all ADLs at this time due to lethargy, pain, decreased sitting balance               Cognition Arousal/Alertness: Lethargic(patient sleepy upon arrival becomes more alert with mobility) Behavior During Therapy: Flat affect Overall Cognitive Status: Impaired/Different from baseline Area of Impairment: Attention;Safety/judgement  Current Attention Level: Sustained   Following Commands: Follows one step commands with increased time Safety/Judgement: Decreased awareness of safety   Problem Solving: Slow processing;Requires verbal cues;Requires tactile cues          Exercises General Exercises - Lower Extremity Ankle Circles/Pumps: AROM;Both;10 reps;Supine Other Exercises Other Exercises: B UE shoulder flexion, 10 reps,  AROM Other Exercises: B LE knee flexion/extension, 10 reps, AAROM           Pertinent Vitals/ Pain       Pain Assessment: 0-10 Pain Score: 7  Pain Location: low back Pain Intervention(s): Limited activity within patient's tolerance;Monitored during session;Patient requesting pain meds-RN notified         Frequency  Min 2X/week        Progress Toward Goals  OT Goals(current goals can now be found in the care plan section)  Progress towards OT goals: Progressing toward goals  Acute Rehab OT Goals Patient Stated Goal: "I should try" OT Goal Formulation: With patient Time For Goal Achievement:  Potential to Achieve Goals: Good ADL Goals Pt Will Perform Grooming: with set-up;sitting Pt Will Perform Upper Body Bathing: with min guard assist;sitting Pt Will Perform Upper Body Dressing: with set-up;sitting Pt Will Perform Lower Body Dressing: with min assist;with adaptive equipment;sit to/from stand Pt Will Transfer to Toilet: with min assist;ambulating;bedside commode  Plan Discharge plan remains appropriate       AM-PAC OT "6 Clicks" Daily Activity     Outcome Measure   Help from another person eating meals?: A Little Help from another person taking care of personal grooming?: Total Help from another person toileting, which includes using toliet, bedpan, or urinal?: Total Help from another person bathing (including washing, rinsing, drying)?: Total Help from another person to put on and taking off regular upper body clothing?: Total Help from another person to put on and taking off regular lower body clothing?: Total 6 Click Score: 8    End of Session    OT Visit Diagnosis: Pain;Unsteadiness on feet (R26.81);Other abnormalities of gait and mobility (R26.89);Muscle weakness (generalized) (M62.81) Pain - part of body: (Back)   Activity Tolerance Patient limited by fatigue;Patient limited by pain   Patient Left in bed;with nursing/sitter in room            Time: 0930-1008 OT Time Calculation (min): 38 min  Charges: OT General Charges $OT Visit: 1 Visit OT Treatments $Self Care/Home Management : 23-37 mins $Therapeutic Exercise: 8-22 mins  Myrtie Neither OT OT office: [phone]   Carmelia Roller 12/24/2018, 10:25 AM

## 2018-12-24 NOTE — Progress Notes (Signed)
   Vital Signs MEWS/VS Documentation      12/24/2018 1200 12/24/2018 1249 12/24/2018 1300 12/24/2018 1523   MEWS Score:  2  3  2  2    MEWS Score Color:  Yellow  Yellow  Yellow  Yellow   Resp:  -  (!) 24  (!) 23  (!) 23   Pulse:  -  (!) 110  (!) 110  (!) 110   BP:  -  127/65  -  126/69   Temp:  -  97.6 F (36.4 C)  -  -   O2 Device:  -  Nasal Cannula  -  Nasal Cannula   O2 Flow Rate (L/min):  -  2 L/min  -  2 L/min   Level of Consciousness:  Alert  Alert  -  Alert        patient MEWS is yellow due to respirations 24 and HR 110, no acute changes noted. pain medication and reposition in bed, patient is resting at this time.     Sheng Pritz A Rivera Montejano 12/24/2018,3:27 PM

## 2018-12-25 ENCOUNTER — Inpatient Hospital Stay (HOSPITAL_COMMUNITY): Payer: Medicare Other

## 2018-12-25 DIAGNOSIS — R0603 Acute respiratory distress: Secondary | ICD-10-CM

## 2018-12-25 LAB — CBC WITH DIFFERENTIAL/PLATELET
Abs Immature Granulocytes: 0.16 10*3/uL — ABNORMAL HIGH (ref 0.00–0.07)
Basophils Absolute: 0.1 10*3/uL (ref 0.0–0.1)
Basophils Relative: 0 %
Eosinophils Absolute: 3.5 10*3/uL — ABNORMAL HIGH (ref 0.0–0.5)
Eosinophils Relative: 21 %
HCT: 33.5 % — ABNORMAL LOW (ref 36.0–46.0)
Hemoglobin: 11.1 g/dL — ABNORMAL LOW (ref 12.0–15.0)
Immature Granulocytes: 1 %
Lymphocytes Relative: 8 %
Lymphs Abs: 1.3 10*3/uL (ref 0.7–4.0)
MCH: 32.6 pg (ref 26.0–34.0)
MCHC: 33.1 g/dL (ref 30.0–36.0)
MCV: 98.5 fL (ref 80.0–100.0)
Monocytes Absolute: 0.9 10*3/uL (ref 0.1–1.0)
Monocytes Relative: 6 %
Neutro Abs: 10.8 10*3/uL — ABNORMAL HIGH (ref 1.7–7.7)
Neutrophils Relative %: 64 %
Platelets: ADEQUATE 10*3/uL (ref 150–400)
RBC: 3.4 MIL/uL — ABNORMAL LOW (ref 3.87–5.11)
RDW: 17.1 % — ABNORMAL HIGH (ref 11.5–15.5)
WBC: 16.7 10*3/uL — ABNORMAL HIGH (ref 4.0–10.5)
nRBC: 0 % (ref 0.0–0.2)

## 2018-12-25 LAB — BASIC METABOLIC PANEL
Anion gap: 10 (ref 5–15)
BUN: 37 mg/dL — ABNORMAL HIGH (ref 8–23)
CO2: 18 mmol/L — ABNORMAL LOW (ref 22–32)
Calcium: 7.8 mg/dL — ABNORMAL LOW (ref 8.9–10.3)
Chloride: 106 mmol/L (ref 98–111)
Creatinine, Ser: 3.01 mg/dL — ABNORMAL HIGH (ref 0.44–1.00)
GFR calc Af Amer: 16 mL/min — ABNORMAL LOW (ref >60)
GFR calc non Af Amer: 14 mL/min — ABNORMAL LOW (ref >60)
Glucose, Bld: 143 mg/dL — ABNORMAL HIGH (ref 70–99)
Potassium: 4.1 mmol/L (ref 3.5–5.1)
Sodium: 134 mmol/L — ABNORMAL LOW (ref 135–145)

## 2018-12-25 LAB — MAGNESIUM: Magnesium: 1.9 mg/dL (ref 1.7–2.4)

## 2018-12-25 IMAGING — DX DG CHEST 1V
1 series · 1 of 1 positions shown · non-contrast
Comparison: Portable exam of T5 hours compared to [DATE]

CLINICAL DATA: Dyspnea, tachypnea; history GERD, hypertension, CHF,
congestive dilated cardiomyopathy

EXAM:
CHEST  1 VIEW

[chest ap]
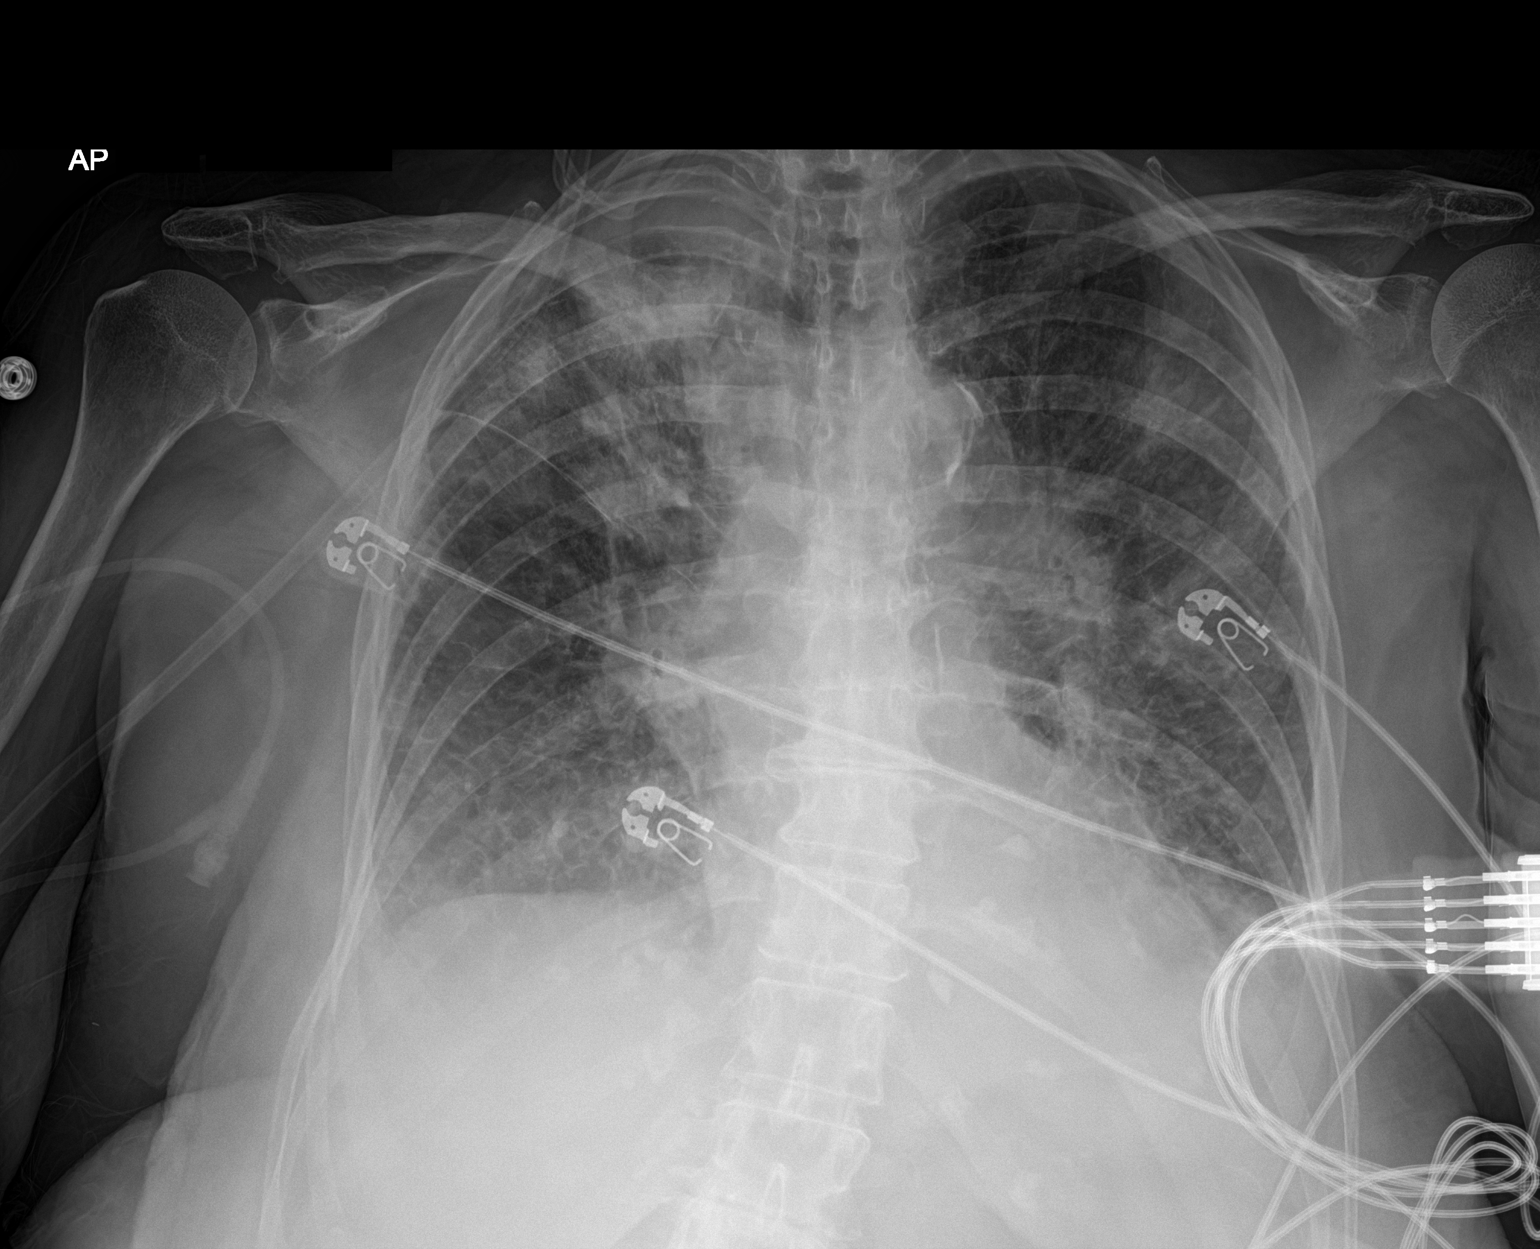

[1 of 1 positions shown; findings below may reference images not displayed]

FINDINGS: Normal heart size, mediastinal contours, and pulmonary vascularity.

Atherosclerotic calcification aorta.

Patchy BILATERAL pulmonary infiltrates greatest in RIGHT upper lobe
consistent with multifocal pneumonia.

Question minimal bibasilar effusions.

No pneumothorax.

Bones demineralized.
IMPRESSION: Patchy BILATERAL pulmonary infiltrates most consistent with
pneumonia, greatest RIGHT upper lobe.

## 2018-12-25 MED ORDER — PIPERACILLIN-TAZOBACTAM IN DEX 2-0.25 GM/50ML IV SOLN
2.2500 g | Freq: Four times a day (QID) | INTRAVENOUS | Status: DC
  Administered 2018-12-25 – 2018-12-26 (×3): 2.25 g via INTRAVENOUS
  Filled 2018-12-25 (×4): qty 50

## 2018-12-25 MED ORDER — ALBUTEROL SULFATE (2.5 MG/3ML) 0.083% IN NEBU
2.5000 mg | INHALATION_SOLUTION | Freq: Once | RESPIRATORY_TRACT | Status: AC
  Administered 2018-12-25: 2.5 mg via RESPIRATORY_TRACT
  Filled 2018-12-25: qty 3

## 2018-12-25 MED ORDER — SODIUM CHLORIDE 0.9 % IV SOLN
INTRAVENOUS | Status: DC | PRN
  Administered 2018-12-26: 5 mL via INTRAVENOUS

## 2018-12-25 NOTE — Progress Notes (Signed)
   12/25/18 1224  MEWS Score  Resp (!) 26  ECG Heart Rate (!) 130  BP (!) 145/78  Temp 98.1 F (36.7 C)  SpO2 96 %  O2 Device Nasal Cannula  O2 Flow Rate (L/min) 3 L/min  MEWS Score  MEWS RR 2  MEWS Pulse 3  MEWS Systolic 0  MEWS LOC 0  MEWS Temp 0  MEWS Score 5  MEWS Score Color Red  MEWS Assessment  Is this an acute change? No    MD aware.

## 2018-12-25 NOTE — Progress Notes (Signed)
Pharmacy Antibiotic Note  Autumn [name] is a 85 y.o. female admitted on 12/18/2018 with fever and AMS.  Currently on Ceftriaxone per ID recommendation for Discitis/osteomyelitis with epidural abscess. Dr.Nettey reported patient worsening and pharmacy has been consulted for Zosyn dosing for aspiration pneumonia.   AKI.  SCr up to 3.27.  Scr is 3.01 today.   Plan: Zosyn 2.25 g IV every 6 hours.  Monitor clinical status, renal function and culture results daily.    Height: 5\' 3"  (160 cm) Weight: 166 lb 10.7 oz (75.6 kg) IBW/kg (Calculated) : 52.4  Temp (24hrs), Avg:98.4 F (36.9 C), Min:97.6 F (36.4 C), Max:99.2 F (37.3 C)  Recent Labs  Lab 12/21/18 0224 12/22/18 0330 12/23/18 0307 12/24/18 0252 12/25/18 0236 12/25/18 1148  WBC 16.6* 15.5* 16.2* 17.0*  --  16.7*  CREATININE 1.03* 2.49* 3.27* 3.21* 3.01*  --     Estimated Creatinine Clearance: 13.6 mL/min (A) (by C-G formula based on SCr of 3.01 mg/dL (H)).    Allergies  Allergen Reactions  . Chlorhexidine   . Hydrochlorothiazide Other (See Comments)    REACTION: hyponatremia  . Ramipril Other (See Comments)    REACTION: GI upset  . Remeron [Mirtazapine] Other (See Comments)    REACTION: weakness  . Sulfa Antibiotics Rash    Antimicrobials this admission: CTX PTA, original SD 12/31/2018, 11/9>> Vanc 11/4>>11/6 Cefepime 11/4>>11/6 Nafcillin 11/6>>11/9  Dose adjustments this admission:   Microbiology results: 11/4 Blood - NGF 11/6 MRSA - neg  Thank you for allowing pharmacy to be a part of this patient's care.  Nicole Cella, RPh Clinical Pharmacist Please check AMION for all [place] phone numbers After 10:00 PM, call [place] [phone] 12/25/2018 4:02 PM

## 2018-12-25 NOTE — Significant Event (Signed)
Rapid Response Event Note  Overview:  Called d/t MEWS-4 for HR-120s and RR-mid 20s. This is not an acute change. NP updated by bedside RN regarding pt status. Please continue to monitor pt and call RRT if assistance needed.    Autumn [name]

## 2018-12-25 NOTE — Progress Notes (Addendum)
PROGRESS NOTE    [name]  ZOX:[id number] DOB: [birth year] DOA: 12/18/2018 PCP: Lajean Manes, MD   Brief Narrative: Autumn [name] is a 85 y.o. female with history of hypertension, chronic systolic CHF, hypothyroidism, left bundle branch block, peripheral neuropathy. Patient presented secondary to altered mental status with concern for sepsis. Found to have multiple issues including PE, discitis/osteomyelitis, pneumonia, CVA.   Assessment & Plan:   Principal Problem:   Sepsis ([place]) Active Problems:   MSSA bacteremia   Acute on chronic combined systolic and diastolic CHF (congestive heart failure) (HCC)   HCAP (healthcare-associated pneumonia)   Acute metabolic encephalopathy   Macrocytic anemia   Acute respiratory failure with hypoxia (HCC)   Pressure injury of skin   Acute septic pulmonary embolism without acute cor pulmonale ([place])   Spinal epidural abscess  Sepsis Present on admission and likely secondary to HCAP vs discitis/osteomyelitis. Initially improved, but now meeting criteria again in setting of worsening pulmonary infiltrates.  Acute L1-L2 discitis/osteomyelitis Possible epidural abscess/bilateral psoas abscesses Neurosurgery consulted and recommended conservative management with antibiotics. ID recommended nafcillin which was switched to Ceftriaxone on 11/9.  History of MSSA bacteremia Probable mitral valve endocarditis. Repeat blood cultures negative.  Respiratory distress Appears to be worsening. Exam concerning for worsening pulmonary process. Associated tachypnea and dyspnea. Chest x-ray obtained and significant for multifocal infiltrates -Switch from Ceftriaxone to Zosyn to cover anaerobes since patient is worsening on Ceftriaxone for the past two days  Acute ischemic stroke Neurology was consulted. CTA head and neck stable. LDL of 89 and hemoglobin A1C of 5.9%. -Continue Lipitor, Eliquis  Pulmonary emboli Small emboli of bilateral lungs with mild  heart strain on imaging. Started on Eliquis -Continue Eliquis  AKI New with mild increase in BUN and creatinine peak of 3.27. Likely secondary to nafcillin which has been discontinued.  Chronic combined systolic and diastolic heart failure Euvolemic. EF improved to 55-60%. -Daily weights  Delirium Appears improved.  Hypokalemia hypomagnesemia Replete as needed  Hypoalbuminemia Dietician recommendations   DVT prophylaxis: Eliquis Code Status:   Code Status: DNR Family Communication: None at bedside Disposition Plan: Discharge pending clinical improvement   Consultants:   Neurology  ID  Palliative care  Neurosurgery  Procedures:   Echo  1. Left ventricular ejection fraction, by visual estimation, is 55 to 60%. The left ventricle has normal function. There is mildly increased left ventricular hypertrophy. 2. Global right ventricle has normal systolic function.The right ventricular size is normal. No increase in right ventricular wall thickness. 3. Left atrium is mildly dialted. 4. The mitral valve is abnormal. Mild mitral valve regurgitation. There is moderate calcification of the mitral valve leaflets. I do not see an obvious vegetation but image qualite is poor. 5. Right atrial size was normal. 6. The aortic valve was not well visualized. Aortic valve regurgitation is not visualized. 7. The pulmonic valve was not well visualized. Pulmonic valve regurgitation is not visualized. 8. Abnormal septal motion consistent with left bundle branch block. 9. The inferior vena cava is normal in size with greater than 50% respiratory variability, suggesting right atrial pressure of 3 mmHg. 10. Limited echo with poor images. 11. Moderate calcification of the mitral valve leaflet(s). 12. The tricuspid valve is normal in structure. Tricuspid valve regurgitation is trivial.  Antimicrobials:  Vancomycin  Cefepime  Nafcillin  Ceftriaxone  Zosyn    Subjective:  Some dyspnea.  Objective: Vitals:   12/25/18 0132 12/25/18 0330 12/25/18 0804 12/25/18 1224  BP:  119/75 (!) 143/76 Marland Kitchen)  145/78  Pulse: 99 100 (!) 101   Resp: 19 20 20  (!) 26  Temp:  99.2 F (37.3 C) 98.7 F (37.1 C) 98.1 F (36.7 C)  TempSrc:  Axillary Skin Skin  SpO2: 98% 100% 98% 96%  Weight:  75.6 kg    Height:        Intake/Output Summary (Last 24 hours) at 12/25/2018 1243 Last data filed at 12/25/2018 0341 Gross per 24 hour  Intake 900 ml  Output 450 ml  Net 450 ml   Filed Weights   12/23/18 0500 12/24/18 0409 12/25/18 0330  Weight: 61 kg 72 kg 75.6 kg    Examination:  General exam: Appears calm and uncomfortable Respiratory system: Diminished, scattered wheezes/rhonchi. Increased RR with some increase in effort. Cardiovascular system: S1 & S2 heard, RRR. No murmurs, rubs, gallops or clicks. Gastrointestinal system: Abdomen is nondistended, soft and nontender. No organomegaly or masses felt. Normal bowel sounds heard. Central nervous system: Alert and oriented. No focal neurological deficits. Extremities: No edema. No calf tenderness Skin: No cyanosis. No rashes Psychiatry: Judgement and insight appear normal. Mood & affect appropriate.     Data Reviewed: I have personally reviewed following labs and imaging studies  CBC: Recent Labs  Lab 12/21/18 0224 12/22/18 0330 12/23/18 0307 12/24/18 0252 12/25/18 1148  WBC 16.6* 15.5* 16.2* 17.0* 16.7*  NEUTROABS  --   --  8.2* 13.0* 10.8*  HGB 12.0 10.3* 10.1* 11.1* 11.1*  HCT 35.1* 31.7* 31.0* 33.5* 33.5*  MCV 94.4 97.2 98.1 96.5 98.5  PLT 283 272 305 313 PLATELET CLUMPS NOTED ON SMEAR, COUNT APPEARS ADEQUATE   Basic Metabolic Panel: Recent Labs  Lab 12/21/18 0224 12/22/18 0330 12/23/18 0307 12/24/18 0252 12/25/18 0236  NA 131* 132* 134* 134* 134*  K 3.3* 3.8 4.1 4.5 4.1  CL 96* 100 100 102 106  CO2 22 21* 21* 19* 18*  GLUCOSE 118* 181* 137* 117* 143*  BUN 10 25* 34* 34* 37*  CREATININE 1.03*  2.49* 3.27* 3.21* 3.01*  CALCIUM 7.8* 7.6* 7.3* 7.9* 7.8*  MG 1.5* 2.5* 2.4 2.1 1.9   GFR: Estimated Creatinine Clearance: 13.6 mL/min (A) (by C-G formula based on SCr of 3.01 mg/dL (H)). Liver Function Tests: Recent Labs  Lab 12/20/18 0522  AST 33  ALT 28  ALKPHOS 113  BILITOT 0.5  PROT 5.1*  ALBUMIN 1.6*   No results for input(s): LIPASE, AMYLASE in the last 168 hours. No results for input(s): AMMONIA in the last 168 hours. Coagulation Profile: No results for input(s): INR, PROTIME in the last 168 hours. Cardiac Enzymes: No results for input(s): CKTOTAL, CKMB, CKMBINDEX, TROPONINI in the last 168 hours. BNP (last 3 results) No results for input(s): PROBNP in the last 8760 hours. HbA1C: No results for input(s): HGBA1C in the last 72 hours. CBG: No results for input(s): GLUCAP in the last 168 hours. Lipid Profile: No results for input(s): CHOL, HDL, LDLCALC, TRIG, CHOLHDL, LDLDIRECT in the last 72 hours. Thyroid Function Tests: No results for input(s): TSH, T4TOTAL, FREET4, T3FREE, THYROIDAB in the last 72 hours. Anemia Panel: No results for input(s): VITAMINB12, FOLATE, FERRITIN, TIBC, IRON, RETICCTPCT in the last 72 hours. Sepsis Labs: Recent Labs  Lab 12/18/18 1244  LATICACIDVEN 1.4    Recent Results (from the past 240 hour(s))  Blood Culture (routine x 2)     Status: None   Collection Time: 12/18/18 10:47 AM   Specimen: BLOOD RIGHT ARM  Result Value Ref Range Status   Specimen Description BLOOD  RIGHT ARM  Final   Special Requests   Final    BOTTLES DRAWN AEROBIC AND ANAEROBIC Blood Culture adequate volume   Culture   Final    NO GROWTH 5 DAYS Performed at [facility], [address] [place], [place] [address]    Report Status 12/23/2018 FINAL  Final  SARS Coronavirus 2 by RT PCR (hospital order, performed in [facility] hospital lab) Nasopharyngeal Nasopharyngeal Swab     Status: None   Collection Time: 12/18/18 12:13 PM   Specimen: Nasopharyngeal  Swab  Result Value Ref Range Status   SARS Coronavirus 2 NEGATIVE NEGATIVE Final    Comment: (NOTE) If result is NEGATIVE SARS-CoV-2 target nucleic acids are NOT DETECTED. The SARS-CoV-2 RNA is generally detectable in upper and lower  respiratory specimens during the acute phase of infection. The lowest  concentration of SARS-CoV-2 viral copies this assay can detect is 250  copies / mL. A negative result does not preclude SARS-CoV-2 infection  and should not be used as the sole basis for treatment or other  patient management decisions.  A negative result may occur with  improper specimen collection / handling, submission of specimen other  than nasopharyngeal swab, presence of viral mutation(s) within the  areas targeted by this assay, and inadequate number of viral copies  (<250 copies / mL). A negative result must be combined with clinical  observations, patient history, and epidemiological information. If result is POSITIVE SARS-CoV-2 target nucleic acids are DETECTED. The SARS-CoV-2 RNA is generally detectable in upper and lower  respiratory specimens dur ing the acute phase of infection.  Positive  results are indicative of active infection with SARS-CoV-2.  Clinical  correlation with patient history and other diagnostic information is  necessary to determine patient infection status.  Positive results do  not rule out bacterial infection or co-infection with other viruses. If result is PRESUMPTIVE POSTIVE SARS-CoV-2 nucleic acids MAY BE PRESENT.   A presumptive positive result was obtained on the submitted specimen  and confirmed on repeat testing.  While 2019 novel coronavirus  (SARS-CoV-2) nucleic acids may be present in the submitted sample  additional confirmatory testing may be necessary for epidemiological  and / or clinical management purposes  to differentiate between  SARS-CoV-2 and other Sarbecovirus currently known to infect humans.  If clinically indicated  additional testing with an alternate test  methodology [phone]) is advised. The SARS-CoV-2 RNA is generally  detectable in upper and lower respiratory sp ecimens during the acute  phase of infection. The expected result is Negative. Fact Sheet for Patients:  BoilerBrush.com.cy Fact Sheet for Healthcare Providers: https://pope.com/ This test is not yet approved or cleared by the [place] FDA and has been authorized for detection and/or diagnosis of SARS-CoV-2 by FDA under an Emergency Use Authorization (EUA).  This EUA will remain in effect (meaning this test can be used) for the duration of the COVID-19 declaration under Section 564(b)(1) of the Act, 21 U.S.C. section 360bbb-3(b)(1), unless the authorization is terminated or revoked sooner. Performed at [facility], [address], [place], [place] [address]   Blood Culture (routine x 2)     Status: None   Collection Time: 12/18/18 12:31 PM   Specimen: BLOOD RIGHT HAND  Result Value Ref Range Status   Specimen Description BLOOD RIGHT HAND  Final   Special Requests   Final    BOTTLES DRAWN AEROBIC ONLY Blood Culture results may not be optimal due to an inadequate volume  of blood received in culture bottles   Culture   Final    NO GROWTH 5 DAYS Performed at [facility], [address], [place], [place] [address]    Report Status 12/23/2018 FINAL  Final  Urine culture     Status: Abnormal   Collection Time: 12/18/18  3:26 PM   Specimen: In/Out Cath Urine  Result Value Ref Range Status   Specimen Description IN/OUT CATH URINE  Final   Special Requests   Final    NONE Performed at [facility], [address] [place], [place] [address]    Culture 20,000 COLONIES/mL YEAST (A)  Final   Report Status 12/19/2018 FINAL  Final  MRSA PCR Screening     Status: None   Collection Time: 12/20/18 12:50 AM   Specimen: Nasal Mucosa; Nasopharyngeal  Result Value Ref  Range Status   MRSA by PCR NEGATIVE NEGATIVE Final    Comment:        The GeneXpert MRSA Assay (FDA approved for NASAL specimens only), is one component of a comprehensive MRSA colonization surveillance program. It is not intended to diagnose MRSA infection nor to guide or monitor treatment for MRSA infections. Performed at [facility], [address], [place], [place] [address]          Radiology Studies: No results found.      Scheduled Meds: . albuterol  2.5 mg Nebulization Once  . apixaban  10 mg Oral BID   Followed by  . [START ON 12/28/2018] apixaban  5 mg Oral BID  . atorvastatin  20 mg Oral QHS  . DULoxetine  30 mg Oral Daily  . feeding supplement (ENSURE ENLIVE)  237 mL Oral TID BM  . LORazepam  0.5 mg Intravenous QHS  . multivitamin with minerals  1 tablet Oral Daily  . sodium chloride flush  3 mL Intravenous Q12H  . traZODone  50 mg Oral QHS   Continuous Infusions: . sodium chloride 75 mL/hr at 12/24/18 1519  . cefTRIAXone (ROCEPHIN)  IV 2 g (12/25/18 0942)  . sodium chloride 250 mL (12/25/18 1040)     LOS: 7 days     Jacquelin Hawking, MD [facility] 12/25/2018, 12:43 PM  If 7PM-7AM, please contact night-coverage [email or web address]

## 2018-12-25 NOTE — Progress Notes (Addendum)
 Physical Therapy Treatment Patient Details Name: Autumn [name] MRN: [id number] DOB: 10-06-1932 Today's Date: 12/25/2018    History of Present Illness 85 y.o. female with fairly complex recent past medical history including admission from 11/12/2018-11/26/2018 during which she was treated for acute ischemic left cerebellar CVA, MSSA bacteremia with signs of posterior mitral valve vegetation consistent with endocarditis with TEE on 11/20/2018 treated with IV Rocephin (last date tentatively 11/17) discharged to SNF, who presented 11/4 with altered mental status. She was found to be febrile and admitted for sepsis secondary to HCAP on vanc and cefepime, also found to have PE and started on heparin drip and new ischemic strokes. Today, she was complaining of severe lower back pain and an MRI was ordered revealed L1-L2 discitis osteomyelitis with associated epidural abscess    PT Comments    Pt received in bed (with RN reporting pt is already premedicated) keeping eyes closed throughout session & requiring max encouragement for participation. Pt declines exercises in bed but willing to let therapist stretch BLE heel cords. Pt is able to lift BLE up to allow therapist to doff/don SCDs but with more noticeable weakness in RLE compared to LLE. Therapist performs BLE PROM heel cord stretch with pt able to dorsiflexion LLE to almost neutral but only slight dorsiflexion noted in RLE. Pt on 3L/min via nasal cannula & RN aware of pt's labored breathing. Therapist attempted to instruct pt in pursed lip breathing but poor engagement & demonstration for pt. Current d/c recommendations remain appropriate as pt requires 24 hr assist 2/2 physical & cognitive deficits.  No family present for education during session.    Follow Up Recommendations  SNF;Supervision/Assistance - 24 hour     Equipment Recommendations  (TBD in next venue)    Recommendations for Other Services       Precautions / Restrictions  Precautions Precautions: Fall Precaution Comments: severe back pain, decub right heel Required Braces or Orthoses: Spinal Brace Spinal Brace: Lumbar corset Restrictions Weight Bearing Restrictions: No    Mobility  Bed Mobility                  Transfers                    Ambulation/Gait                 Stairs             Wheelchair Mobility    Modified Rankin (Stroke Patients Only)       Balance                                            Cognition Arousal/Alertness: Lethargic Behavior During Therapy: Flat affect Overall Cognitive Status: Impaired/Different from baseline Area of Impairment: Attention;Safety/judgement;Orientation                 Orientation Level: Disoriented to;Situation;Time Current Attention Level: Sustained   Following Commands: Follows one step commands with increased time Safety/Judgement: Decreased awareness of safety   Problem Solving: Slow processing General Comments: Pt with HR 124-126 during session & RN already aware. Pt on 3L/min supplemental oxygen via nasal cannula.      Exercises      General Comments        Pertinent Vitals/Pain Pain Assessment: Faces Faces Pain Scale: No hurt    Home Living  Prior Function            PT Goals (current goals can now be found in the care plan section) Acute Rehab PT Goals Patient Stated Goal: to sleep PT Goal Formulation: With patient Time For Goal Achievement:  Potential to Achieve Goals: Fair    Frequency    Min 2X/week      PT Plan Current plan remains appropriate    Co-evaluation              AM-PAC PT "6 Clicks" Mobility   Outcome Measure  Help needed turning from your back to your side while in a flat bed without using bedrails?: Total Help needed moving from lying on your back to sitting on the side of a flat bed without using bedrails?: Total Help needed moving  to and from a bed to a chair (including a wheelchair)?: Total Help needed standing up from a chair using your arms (e.g., wheelchair or bedside chair)?: Total Help needed to walk in hospital room?: Total Help needed climbing 3-5 steps with a railing? : Total 6 Click Score: 6    End of Session   Activity Tolerance: Patient limited by fatigue Patient left: in bed;with call bell/phone within reach;with SCD's reapplied Nurse Communication: Mobility status PT Visit Diagnosis: Muscle weakness (generalized) (M62.81);Difficulty in walking, not elsewhere classified (R26.2);Other abnormalities of gait and mobility (R26.89);Pain     Time: 3149-7026 PT Time Calculation (min) (ACUTE ONLY): 13 min  Charges:  $Therapeutic Activity: 8-22 mins(13)                        Sandi Mariscal, PT, DPT 12/25/2018, 2:56 PM

## 2018-12-25 NOTE — Progress Notes (Signed)
Pt's HR continues to sustain in the 130's. MD aware. Will continue to monitor.

## 2018-12-26 DIAGNOSIS — R918 Other nonspecific abnormal finding of lung field: Secondary | ICD-10-CM

## 2018-12-26 DIAGNOSIS — I639 Cerebral infarction, unspecified: Secondary | ICD-10-CM

## 2018-12-26 LAB — BASIC METABOLIC PANEL
Anion gap: 12 (ref 5–15)
BUN: 39 mg/dL — ABNORMAL HIGH (ref 8–23)
CO2: 15 mmol/L — ABNORMAL LOW (ref 22–32)
Calcium: 8.1 mg/dL — ABNORMAL LOW (ref 8.9–10.3)
Chloride: 109 mmol/L (ref 98–111)
Creatinine, Ser: 2.87 mg/dL — ABNORMAL HIGH (ref 0.44–1.00)
GFR calc Af Amer: 17 mL/min — ABNORMAL LOW (ref >60)
GFR calc non Af Amer: 14 mL/min — ABNORMAL LOW (ref >60)
Glucose, Bld: 127 mg/dL — ABNORMAL HIGH (ref 70–99)
Potassium: 4.7 mmol/L (ref 3.5–5.1)
Sodium: 136 mmol/L (ref 135–145)

## 2018-12-26 LAB — CBC
HCT: 29.8 % — ABNORMAL LOW (ref 36.0–46.0)
Hemoglobin: 9.5 g/dL — ABNORMAL LOW (ref 12.0–15.0)
MCH: 31.6 pg (ref 26.0–34.0)
MCHC: 31.9 g/dL (ref 30.0–36.0)
MCV: 99 fL (ref 80.0–100.0)
Platelets: 304 10*3/uL (ref 150–400)
RBC: 3.01 MIL/uL — ABNORMAL LOW (ref 3.87–5.11)
RDW: 17.1 % — ABNORMAL HIGH (ref 11.5–15.5)
WBC: 13.7 10*3/uL — ABNORMAL HIGH (ref 4.0–10.5)
nRBC: 0 % (ref 0.0–0.2)

## 2018-12-26 LAB — PATHOLOGIST SMEAR REVIEW: Path Review: 11122020

## 2018-12-26 LAB — MAGNESIUM: Magnesium: 1.9 mg/dL (ref 1.7–2.4)

## 2018-12-26 MED ORDER — ORAL CARE MOUTH RINSE
15.0000 mL | Freq: Two times a day (BID) | OROMUCOSAL | Status: DC
  Administered 2018-12-26 – 2018-12-31 (×11): 15 mL via OROMUCOSAL

## 2018-12-26 MED ORDER — SODIUM CHLORIDE 0.9 % IV SOLN
2.0000 g | Freq: Two times a day (BID) | INTRAVENOUS | Status: DC
  Administered 2018-12-26 – 2018-12-29 (×7): 2 g via INTRAVENOUS
  Filled 2018-12-26 (×8): qty 20

## 2018-12-26 NOTE — Progress Notes (Signed)
[facility]   Reason for visit: Follow up on MSSA bacteremia  Interval History: blood cultures have remained negative.  She reports poor sleep. WBC improved at 13.7.  Remains afebrile. No associated rash or diarrhea.  Rapid response called due to abnormal vital signs.  No acute changes.  Changed to piperacillin/tazobactam with concern for aspiration  Physical Exam: Constitutional:  Vitals:   12/26/18 0800 12/26/18 0856  BP:  136/79  Pulse:  (!) 107  Resp:  (!) 25  Temp: 97.9 F (36.6 C)   SpO2:  100%   patient appears in NAD Respiratory: Normal respiratory effort; CTA B Cardiovascular: RRR GI: soft, nt, nd  Review of Systems: Constitutional: negative for fevers and chills Gastrointestinal: negative for nausea and diarrhea Integument/breast: negative for rash  Lab Results  Component Value Date   WBC 13.7 (H) 12/26/2018   HGB 9.5 (L) 12/26/2018   HCT 29.8 (L) 12/26/2018   MCV 99.0 12/26/2018   PLT 304 12/26/2018    Lab Results  Component Value Date   CREATININE 2.87 (H) 12/26/2018   BUN 39 (H) 12/26/2018   NA 136 12/26/2018   K 4.7 12/26/2018   CL 109 12/26/2018   CO2 15 (L) 12/26/2018    Lab Results  Component Value Date   ALT 28 12/20/2018   AST 33 12/20/2018   ALKPHOS 113 12/20/2018     Microbiology: Recent Results (from the past 240 hour(s))  Blood Culture (routine x 2)     Status: None   Collection Time: 12/18/18 10:47 AM   Specimen: BLOOD RIGHT ARM  Result Value Ref Range Status   Specimen Description BLOOD RIGHT ARM  Final   Special Requests   Final    BOTTLES DRAWN AEROBIC AND ANAEROBIC Blood Culture adequate volume   Culture   Final    NO GROWTH 5 DAYS Performed at [facility], [address], [place], [place] [address]    Report Status 12/23/2018 FINAL  Final  SARS Coronavirus 2 by RT PCR (hospital order, performed in [facility] hospital lab) Nasopharyngeal Nasopharyngeal Swab     Status: None   Collection Time: 12/18/18 12:13 PM   Specimen: Nasopharyngeal Swab  Result Value Ref Range Status   SARS Coronavirus 2 NEGATIVE NEGATIVE Final    Comment: (NOTE) If result is NEGATIVE SARS-CoV-2 target nucleic acids are NOT DETECTED. The SARS-CoV-2 RNA is generally detectable in upper and lower  respiratory specimens during the acute phase of infection. The lowest  concentration of SARS-CoV-2 viral copies this assay can detect is 250  copies / mL. A negative result does not preclude SARS-CoV-2 infection  and should not be used as the sole basis for treatment or other  patient management decisions.  A negative result may occur with  improper specimen collection / handling, submission of specimen other  than nasopharyngeal swab, presence of viral mutation(s) within the  areas targeted by this assay, and inadequate number of viral copies  (<250 copies / mL). A negative result must be combined with clinical  observations, patient history, and epidemiological information. If result is POSITIVE SARS-CoV-2 target nucleic acids are DETECTED. The SARS-CoV-2 RNA is generally detectable in upper and lower  respiratory specimens dur ing the acute phase of infection.  Positive  results are indicative of active infection with SARS-CoV-2.  Clinical  correlation with patient history and other diagnostic information is  necessary to determine patient infection status.  Positive results do  not rule  out bacterial infection or co-infection with other viruses. If result is PRESUMPTIVE POSTIVE SARS-CoV-2 nucleic acids MAY BE PRESENT.   A presumptive positive result was obtained on the submitted specimen  and confirmed on repeat testing.  While 2019 novel coronavirus  (SARS-CoV-2) nucleic acids may be present in the submitted sample  additional confirmatory testing may be necessary for epidemiological  and / or clinical management purposes  to differentiate between  SARS-CoV-2 and other Sarbecovirus  currently known to infect humans.  If clinically indicated additional testing with an alternate test  methodology [phone]) is advised. The SARS-CoV-2 RNA is generally  detectable in upper and lower respiratory sp ecimens during the acute  phase of infection. The expected result is Negative. Fact Sheet for Patients:  StrictlyIdeas.no Fact Sheet for Healthcare Providers: BankingDealers.co.za This test is not yet approved or cleared by the [place] FDA and has been authorized for detection and/or diagnosis of SARS-CoV-2 by FDA under an Emergency Use Authorization (EUA).  This EUA will remain in effect (meaning this test can be used) for the duration of the COVID-19 declaration under Section 564(b)(1) of the Act, 21 U.S.C. section 360bbb-3(b)(1), unless the authorization is terminated or revoked sooner. Performed at [facility], [place] [address] [place], [place] [address]   Blood Culture (routine x 2)     Status: None   Collection Time: 12/18/18 12:31 PM   Specimen: BLOOD RIGHT HAND  Result Value Ref Range Status   Specimen Description BLOOD RIGHT HAND  Final   Special Requests   Final    BOTTLES DRAWN AEROBIC ONLY Blood Culture results may not be optimal due to an inadequate volume of blood received in culture bottles   Culture   Final    NO GROWTH 5 DAYS Performed at [facility], [place] [address], [place], [place] [address]    Report Status 12/23/2018 FINAL  Final  Urine culture     Status: Abnormal   Collection Time: 12/18/18  3:26 PM   Specimen: In/Out Cath Urine  Result Value Ref Range Status   Specimen Description IN/OUT CATH URINE  Final   Special Requests   Final    NONE Performed at [facility], [place] [address] [place], [place] [address]    Culture 20,000 COLONIES/mL YEAST (A)  Final   Report Status 12/19/2018 FINAL  Final  MRSA PCR Screening     Status: None   Collection Time: 12/20/18 12:50 AM    Specimen: Nasal Mucosa; Nasopharyngeal  Result Value Ref Range Status   MRSA by PCR NEGATIVE NEGATIVE Final    Comment:        The GeneXpert MRSA Assay (FDA approved for NASAL specimens only), is one component of a comprehensive MRSA colonization surveillance program. It is not intended to diagnose MRSA infection nor to guide or monitor treatment for MRSA infections. Performed at [facility], [place] [address] [place], [place] [address]     Impression/Plan:  1. Discitis/osteomyelitis with epidural abscess - continues on ceftriaxone and will need a prolonged course.  No changes.   2.  AKI - creat improved to 2.87.  Possibly from nafcillin toxicity.  Now on ceftriaxone.    3.  Mitral valve vegetation - continuing on ceftriaxone for a prolonged period.   4.  Embolic stroke - she is weakened and not participating much with PT.  Not getting out of bed.  Will need rehab/SNF.    5.  Pulmonary infiltrates - bilateral.  She is not on O2, no increased wob and no cough.  I will change her back to ceftriaxone.  This may be more atelectasis from being bedbound.

## 2018-12-26 NOTE — Plan of Care (Signed)
  Problem: Education: Goal: Knowledge of disease or condition will improve Outcome: Progressing Goal: Knowledge of secondary prevention will improve Outcome: Progressing Goal: Knowledge of patient specific risk factors addressed and post discharge goals established will improve Outcome: Progressing   Problem: Coping: Goal: Will identify appropriate support needs Outcome: Progressing   Problem: Education: Goal: Knowledge of secondary prevention will improve Outcome: Progressing

## 2018-12-26 NOTE — Progress Notes (Signed)
Nutrition Follow-up  RD working remotely.   DOCUMENTATION CODES:   Not applicable  INTERVENTION:  - continue Ensure Enlive TID. - continue to encourage PO intakes.    NUTRITION DIAGNOSIS:   Inadequate oral intake related to decreased appetite as evidenced by per patient/family report. -ongoing  GOAL:   Patient will meet greater than or equal to 90% of their needs -unmet  MONITOR:   PO intake, Supplement acceptance, Diet advancement, Labs, Weight trends, Skin, I & O's  ASSESSMENT:   Autumn [name] is a 85 y.o. female with medical history significant of hypertension, systolic CHF, hypothyroidism, left bundle branch block, and peripheral neuropathy presenting with complaints of fever and altered mental status. History from the patient is limited due to her current state.  Patient is a/o to person and place. Per flow sheet documentation, patient has been eating 0-25% of meals over the past 1 week. She has been accepting Ensure 75% of the time offered. Flow sheet documentation indicates that patient has moderate generalized edema. Weight had trended down from 11/4-11/6 and then began trending back up on 11/10. Weight today is consistent with admission (11/4) weight.    Per notes: - sepsis on admission--thought to be 2/2 HCAP vs discitis/osteomyelitis; improved but again worsening - acute L1-L2 discitis/osteomyelitis, possible epidural abscess, bilateral psoas abscesses - respiratory distress--worsening; concern for pulmonary process; CXR indicates multifocal infiltrates - acute ischemic stroke - AKI - hypokalemia and hypomagnesemia--resolved   Labs reviewed; BUN: 39 mg/dl, creatinine: 2.87 mg/dl, Ca: 8.1 mg/dl. Medications reviewed; daily multivitamin with minerals. IVF; NS @ 75 ml/hr.      NUTRITION - FOCUSED PHYSICAL EXAM:  unable to complete at this time.   Diet Order:   Diet Order            Diet regular Room service appropriate? Yes; Fluid consistency: Thin  Diet  effective now              EDUCATION NEEDS:   No education needs have been identified at this time  Skin:  Skin Assessment: Skin Integrity Issues: Skin Integrity Issues:: DTI DTI: rt heel  Last BM:  11/10  Height:   Ht Readings from Last 1 Encounters:  12/18/18 5\' 3"  (1.6 m)    Weight:   Wt Readings from Last 1 Encounters:  12/26/18 74.1 kg    Ideal Body Weight:  52.3 kg  BMI:  Body mass index is 28.94 kg/m.  Estimated Nutritional Needs:   Kcal:  1650-1850  Protein:  80-95 grams  Fluid:  > 1.6 L      Jarome Matin, MS, RD, LDN, [facility] Inpatient Clinical Dietitian Pager # [phone] After hours/weekend pager # [phone]

## 2018-12-26 NOTE — Progress Notes (Signed)
PROGRESS NOTE    Autumn [name]  XNA:[id number] DOB: 1932-07-03 DOA: 12/18/2018 PCP: Merlene LaughterStoneking, Hal, MD   Brief Narrative: Autumn [name] is a 85 y.o. female with history of hypertension, chronic systolic CHF, hypothyroidism, left bundle branch block, peripheral neuropathy. Patient presented secondary to altered mental status with concern for sepsis. Found to have multiple issues including PE, discitis/osteomyelitis, pneumonia, CVA.   Assessment & Plan:   Principal Problem:   Sepsis (HCC) Active Problems:   MSSA bacteremia   Acute on chronic combined systolic and diastolic CHF (congestive heart failure) (HCC)   HCAP (healthcare-associated pneumonia)   Acute metabolic encephalopathy   Macrocytic anemia   Acute respiratory failure with hypoxia (HCC)   Pressure injury of skin   Acute septic pulmonary embolism without acute cor pulmonale (HCC)   Spinal epidural abscess  Sepsis Present on admission and likely secondary to HCAP vs discitis/osteomyelitis. Initially improved, but now meeting criteria again in setting of worsening pulmonary infiltrates.  Acute L1-L2 discitis/osteomyelitis Possible epidural abscess/bilateral psoas abscesses Neurosurgery consulted and recommended conservative management with antibiotics. ID recommended nafcillin which was switched to Ceftriaxone on 11/9.  History of MSSA bacteremia Probable mitral valve endocarditis. Repeat blood cultures negative.  Respiratory distress Chest x-ray concerning for multifocal pneumonia. No procalcitonin ordered secondary to underlying systemic infection. Antibiotics switched to Zosyn to for broader coverage. Switched back to Ceftriaxone on 11/12.  Acute ischemic stroke Neurology was consulted. CTA head and neck stable. LDL of 89 and hemoglobin A1C of 5.9%. -Continue Lipitor, Eliquis  Pulmonary emboli Small emboli of bilateral lungs with mild heart strain on imaging. Started on Eliquis -Continue Eliquis  AKI New with  mild increase in BUN and creatinine peak of 3.27. Likely secondary to nafcillin which has been discontinued.  Chronic combined systolic and diastolic heart failure Euvolemic. EF improved to 55-60%. -Daily weights  Delirium Appears improved.  Hypokalemia hypomagnesemia Replete as needed  Hypoalbuminemia Dietician recommendations   DVT prophylaxis: Eliquis Code Status:   Code Status: DNR Family Communication: None at bedside Disposition Plan: Discharge pending clinical improvement   Consultants:   Neurology  ID  Palliative care  Neurosurgery  Procedures:   Echo  1. Left ventricular ejection fraction, by visual estimation, is 55 to 60%. The left ventricle has normal function. There is mildly increased left ventricular hypertrophy. 2. Global right ventricle has normal systolic function.The right ventricular size is normal. No increase in right ventricular wall thickness. 3. Left atrium is mildly dialted. 4. The mitral valve is abnormal. Mild mitral valve regurgitation. There is moderate calcification of the mitral valve leaflets. I do not see an obvious vegetation but image qualite is poor. 5. Right atrial size was normal. 6. The aortic valve was not well visualized. Aortic valve regurgitation is not visualized. 7. The pulmonic valve was not well visualized. Pulmonic valve regurgitation is not visualized. 8. Abnormal septal motion consistent with left bundle branch block. 9. The inferior vena cava is normal in size with greater than 50% respiratory variability, suggesting right atrial pressure of 3 mmHg. 10. Limited echo with poor images. 11. Moderate calcification of the mitral valve leaflet(s). 12. The tricuspid valve is normal in structure. Tricuspid valve regurgitation is trivial.  Antimicrobials:  Vancomycin  Cefepime  Nafcillin  Ceftriaxone  Zosyn    Subjective: Some dyspnea.  Objective: Vitals:   12/26/18 1222 12/26/18 1256 12/26/18  1315 12/26/18 1356  BP:  (!) 136/94  136/83  Pulse:  (!) 110 (!) 111 (!) 109  Resp:  Marland Kitchen(!)  28 (!) 23   Temp: 97.8 F (36.6 C)     TempSrc: Skin     SpO2:  100% 100% 100%  Weight:      Height:        Intake/Output Summary (Last 24 hours) at 12/26/2018 1558 Last data filed at 12/26/2018 1200 Gross per 24 hour  Intake 3180.37 ml  Output 1425 ml  Net 1755.37 ml   Filed Weights   12/24/18 0409 12/25/18 0330 12/26/18 0356  Weight: 72 kg 75.6 kg 74.1 kg    Examination:  General exam: Appears calm and comfortable Respiratory system: diminished with some mild wheeze. Respiratory effort normal. Cardiovascular system: S1 & S2 heard, RRR. No murmurs, rubs, gallops or clicks. Gastrointestinal system: Abdomen is nondistended, soft and nontender. No organomegaly or masses felt. Normal bowel sounds heard. Central nervous system: Alert and oriented. No focal neurological deficits. Extremities: No edema. No calf tenderness Skin: No cyanosis. No rashes Psychiatry: Judgement and insight appear normal. Mood & affect appropriate.     Data Reviewed: I have personally reviewed following labs and imaging studies  CBC: Recent Labs  Lab 12/22/18 0330 12/23/18 0307 12/24/18 0252 12/25/18 1148 12/26/18 0317  WBC 15.5* 16.2* 17.0* 16.7* 13.7*  NEUTROABS  --  8.2* 13.0* 10.8*  --   HGB 10.3* 10.1* 11.1* 11.1* 9.5*  HCT 31.7* 31.0* 33.5* 33.5* 29.8*  MCV 97.2 98.1 96.5 98.5 99.0  PLT 272 305 313 PLATELET CLUMPS NOTED ON SMEAR, COUNT APPEARS ADEQUATE 304   Basic Metabolic Panel: Recent Labs  Lab 12/22/18 0330 12/23/18 0307 12/24/18 0252 12/25/18 0236 12/26/18 0317  NA 132* 134* 134* 134* 136  K 3.8 4.1 4.5 4.1 4.7  CL 100 100 102 106 109  CO2 21* 21* 19* 18* 15*  GLUCOSE 181* 137* 117* 143* 127*  BUN 25* 34* 34* 37* 39*  CREATININE 2.49* 3.27* 3.21* 3.01* 2.87*  CALCIUM 7.6* 7.3* 7.9* 7.8* 8.1*  MG 2.5* 2.4 2.1 1.9 1.9   GFR: Estimated Creatinine Clearance: 14.1 mL/min (A) (by  C-G formula based on SCr of 2.87 mg/dL (H)). Liver Function Tests: Recent Labs  Lab 12/20/18 0522  AST 33  ALT 28  ALKPHOS 113  BILITOT 0.5  PROT 5.1*  ALBUMIN 1.6*   No results for input(s): LIPASE, AMYLASE in the last 168 hours. No results for input(s): AMMONIA in the last 168 hours. Coagulation Profile: No results for input(s): INR, PROTIME in the last 168 hours. Cardiac Enzymes: No results for input(s): CKTOTAL, CKMB, CKMBINDEX, TROPONINI in the last 168 hours. BNP (last 3 results) No results for input(s): PROBNP in the last 8760 hours. HbA1C: No results for input(s): HGBA1C in the last 72 hours. CBG: No results for input(s): GLUCAP in the last 168 hours. Lipid Profile: No results for input(s): CHOL, HDL, LDLCALC, TRIG, CHOLHDL, LDLDIRECT in the last 72 hours. Thyroid Function Tests: No results for input(s): TSH, T4TOTAL, FREET4, T3FREE, THYROIDAB in the last 72 hours. Anemia Panel: No results for input(s): VITAMINB12, FOLATE, FERRITIN, TIBC, IRON, RETICCTPCT in the last 72 hours. Sepsis Labs: No results for input(s): PROCALCITON, LATICACIDVEN in the last 168 hours.  Recent Results (from the past 240 hour(s))  Blood Culture (routine x 2)     Status: None   Collection Time: 12/18/18 10:47 AM   Specimen: BLOOD RIGHT ARM  Result Value Ref Range Status   Specimen Description BLOOD RIGHT ARM  Final   Special Requests   Final    BOTTLES DRAWN AEROBIC AND ANAEROBIC Blood  Culture adequate volume   Culture   Final    NO GROWTH 5 DAYS Performed at [facility], [address], [place], [place] [address]    Report Status 12/23/2018 FINAL  Final  SARS Coronavirus 2 by RT PCR (hospital order, performed in [facility] hospital lab) Nasopharyngeal Nasopharyngeal Swab     Status: None   Collection Time: 12/18/18 12:13 PM   Specimen: Nasopharyngeal Swab  Result Value Ref Range Status   SARS Coronavirus 2 NEGATIVE NEGATIVE Final    Comment: (NOTE) If result is NEGATIVE  SARS-CoV-2 target nucleic acids are NOT DETECTED. The SARS-CoV-2 RNA is generally detectable in upper and lower  respiratory specimens during the acute phase of infection. The lowest  concentration of SARS-CoV-2 viral copies this assay can detect is 250  copies / mL. A negative result does not preclude SARS-CoV-2 infection  and should not be used as the sole basis for treatment or other  patient management decisions.  A negative result may occur with  improper specimen collection / handling, submission of specimen other  than nasopharyngeal swab, presence of viral mutation(s) within the  areas targeted by this assay, and inadequate number of viral copies  (<250 copies / mL). A negative result must be combined with clinical  observations, patient history, and epidemiological information. If result is POSITIVE SARS-CoV-2 target nucleic acids are DETECTED. The SARS-CoV-2 RNA is generally detectable in upper and lower  respiratory specimens dur ing the acute phase of infection.  Positive  results are indicative of active infection with SARS-CoV-2.  Clinical  correlation with patient history and other diagnostic information is  necessary to determine patient infection status.  Positive results do  not rule out bacterial infection or co-infection with other viruses. If result is PRESUMPTIVE POSTIVE SARS-CoV-2 nucleic acids MAY BE PRESENT.   A presumptive positive result was obtained on the submitted specimen  and confirmed on repeat testing.  While 2019 novel coronavirus  (SARS-CoV-2) nucleic acids may be present in the submitted sample  additional confirmatory testing may be necessary for epidemiological  and / or clinical management purposes  to differentiate between  SARS-CoV-2 and other Sarbecovirus currently known to infect humans.  If clinically indicated additional testing with an alternate test  methodology [phone]) is advised. The SARS-CoV-2 RNA is generally  detectable in upper  and lower respiratory sp ecimens during the acute  phase of infection. The expected result is Negative. Fact Sheet for Patients:  BoilerBrush.com.cy Fact Sheet for Healthcare Providers: https://pope.com/ This test is not yet approved or cleared by the [place] FDA and has been authorized for detection and/or diagnosis of SARS-CoV-2 by FDA under an Emergency Use Authorization (EUA).  This EUA will remain in effect (meaning this test can be used) for the duration of the COVID-19 declaration under Section 564(b)(1) of the Act, 21 U.S.C. section 360bbb-3(b)(1), unless the authorization is terminated or revoked sooner. Performed at [facility], [address], [place], [place] [address]   Blood Culture (routine x 2)     Status: None   Collection Time: 12/18/18 12:31 PM   Specimen: BLOOD RIGHT HAND  Result Value Ref Range Status   Specimen Description BLOOD RIGHT HAND  Final   Special Requests   Final    BOTTLES DRAWN AEROBIC ONLY Blood Culture results may not be optimal due to an inadequate volume of blood received in culture bottles   Culture   Final    NO GROWTH 5 DAYS Performed  at [facility], [place] [address], [place], [place] [address]    Report Status 12/23/2018 FINAL  Final  Urine culture     Status: Abnormal   Collection Time: 12/18/18  3:26 PM   Specimen: In/Out Cath Urine  Result Value Ref Range Status   Specimen Description IN/OUT CATH URINE  Final   Special Requests   Final    NONE Performed at [facility], [place] [address] [place], [place] [address]    Culture 20,000 COLONIES/mL YEAST (A)  Final   Report Status 12/19/2018 FINAL  Final  MRSA PCR Screening     Status: None   Collection Time: 12/20/18 12:50 AM   Specimen: Nasal Mucosa; Nasopharyngeal  Result Value Ref Range Status   MRSA by PCR NEGATIVE NEGATIVE Final    Comment:        The GeneXpert MRSA Assay (FDA approved for NASAL specimens  only), is one component of a comprehensive MRSA colonization surveillance program. It is not intended to diagnose MRSA infection nor to guide or monitor treatment for MRSA infections. Performed at [facility], [place] [address], [place],  [address]          Radiology Studies: Dg Chest 1 View  Result Date: 12/25/2018 CLINICAL DATA:  Dyspnea, tachypnea; history GERD, hypertension, CHF, congestive dilated cardiomyopathy EXAM: CHEST  1 VIEW COMPARISON:  Portable exam of T5 hours compared to 12/18/2018 FINDINGS: Normal heart size, mediastinal contours, and pulmonary vascularity. Atherosclerotic calcification aorta. Patchy BILATERAL pulmonary infiltrates greatest in RIGHT upper lobe consistent with multifocal pneumonia. Question minimal bibasilar effusions. No pneumothorax. Bones demineralized. IMPRESSION: Patchy BILATERAL pulmonary infiltrates most consistent with pneumonia, greatest RIGHT upper lobe. Electronically Signed   By: Lavonia Dana M.D.   On: 12/25/2018 13:04        Scheduled Meds: . apixaban  10 mg Oral BID   Followed by  . [START ON 12/28/2018] apixaban  5 mg Oral BID  . atorvastatin  20 mg Oral QHS  . DULoxetine  30 mg Oral Daily  . feeding supplement (ENSURE ENLIVE)  237 mL Oral TID BM  . LORazepam  0.5 mg Intravenous QHS  . mouth rinse  15 mL Mouth Rinse BID  . multivitamin with minerals  1 tablet Oral Daily  . sodium chloride flush  3 mL Intravenous Q12H  . traZODone  50 mg Oral QHS   Continuous Infusions: . sodium chloride 75 mL/hr at 12/26/18 0618  . sodium chloride 10 mL/hr at 12/26/18 0616  . cefTRIAXone (ROCEPHIN)  IV 2 g (12/26/18 1422)  . sodium chloride 250 mL (12/26/18 0919)     LOS: 8 days     Cordelia Poche, MD [facility] 12/26/2018, 3:58 PM  If 7PM-7AM, please contact night-coverage [email or web address]

## 2018-12-26 NOTE — Progress Notes (Signed)
   12/25/18 1941  MEWS Score  Resp (!) 29  ECG Heart Rate (!) 121  Pulse Rate (!) 121  BP (!) 141/69  Temp 97.7 F (36.5 C)  Level of Consciousness Alert  SpO2 98 %  O2 Device Nasal Cannula  O2 Flow Rate (L/min) 4 L/min  MEWS Score  MEWS RR 2  MEWS Pulse 2  MEWS Systolic 0  MEWS LOC 0  MEWS Temp 0  MEWS Score 4  MEWS Score Color Red  MEWS Assessment  Is this an acute change? Yes  MEWS guidelines implemented *See Row Information* Red  Rapid Response Notification  Name of Rapid Response RN Notified Anselm Pancoast, RN  Date Rapid Response Notified 12/25/18  Time Rapid Response Notified 2000  Provider Notification  Provider Name/Title Tylene Fantasia, NP  Date Provider Notified 12/25/18  Time Provider Notified 1958  Notification Type Page  Notification Reason Change in status  Response No new orders  Date of Provider Response 12/25/18  Time of Provider Response 2015   Lab Results WBC  Date/Time Value Ref Range Status  12/25/2018 11:48 AM 16.7 (H) 4.0 - 10.5 K/uL Final  12/24/2018 02:52 AM 17.0 (H) 4.0 - 10.5 K/uL Final  12/23/2018 03:07 AM 16.2 (H) 4.0 - 10.5 K/uL Final   Neutrophils Relative %  Date/Time Value Ref Range Status  12/25/2018 11:48 AM 64 % Final  12/24/2018 02:52 AM 77 % Final  12/23/2018 03:07 AM 52 % Final   pCO2 arterial  Date/Time Value Ref Range Status  11/17/2018 05:50 PM 33.2 32.0 - 48.0 mmHg Final   Lactic Acid, Venous  Date/Time Value Ref Range Status  12/18/2018 12:44 PM 1.4 0.5 - 1.9 mmol/L Final    Comment:    Performed at [facility], [place] [address] [place], [place] [address]  12/18/2018 10:56 AM 2.0 (HH) 0.5 - 1.9 mmol/L Final    Comment:    CRITICAL RESULT CALLED TO, READ BACK BY AND VERIFIED WITH: [facility] RN @ 6045 12/18/18 LEONARD,A Performed at [facility], [place] [address], [place], [place] [address]   11/18/2018 07:51 AM 2.4 (HH) 0.5 - 1.9 mmol/L Final    Comment:    CRITICAL RESULT CALLED TO, READ BACK BY AND  VERIFIED WITH: K.JOHNSON,RN 1914 11/18/2018 CLARK,S Performed at [facility], [place] [address], [place], [place] [address]    pCO2, Ven  Date/Time Value Ref Range Status  12/18/2018 12:51 PM 38.8 (L) 44.0 - 60.0 mmHg Final

## 2018-12-26 NOTE — Progress Notes (Signed)
   12/26/18 0856  Vitals  BP 136/79  MAP (mmHg) 98  BP Location Left Arm  BP Method Automatic  Patient Position (if appropriate) Lying  Pulse Rate (!) 107  ECG Heart Rate (!) 107  Resp (!) 25  Oxygen Therapy  SpO2 100 %  MEWS Score  MEWS RR 1  MEWS Pulse 1  MEWS Systolic 0  MEWS LOC 0  MEWS Temp 0  MEWS Score 2  MEWS Score Color Yellow  MEWS Assessment  Is this an acute change? No    Pt's Mews score 2/yellow r/t RR and HR. Not an acute change. Pt not in distress. Will continue to monitor.

## 2018-12-27 LAB — CBC
HCT: 31.1 % — ABNORMAL LOW (ref 36.0–46.0)
Hemoglobin: 10 g/dL — ABNORMAL LOW (ref 12.0–15.0)
MCH: 32.8 pg (ref 26.0–34.0)
MCHC: 32.2 g/dL (ref 30.0–36.0)
MCV: 102 fL — ABNORMAL HIGH (ref 80.0–100.0)
Platelets: 322 10*3/uL (ref 150–400)
RBC: 3.05 MIL/uL — ABNORMAL LOW (ref 3.87–5.11)
RDW: 18 % — ABNORMAL HIGH (ref 11.5–15.5)
WBC: 18.2 10*3/uL — ABNORMAL HIGH (ref 4.0–10.5)
nRBC: 0.3 % — ABNORMAL HIGH (ref 0.0–0.2)

## 2018-12-27 LAB — BASIC METABOLIC PANEL
Anion gap: 20 — ABNORMAL HIGH (ref 5–15)
BUN: 36 mg/dL — ABNORMAL HIGH (ref 8–23)
CO2: 15 mmol/L — ABNORMAL LOW (ref 22–32)
Calcium: 8.5 mg/dL — ABNORMAL LOW (ref 8.9–10.3)
Chloride: 106 mmol/L (ref 98–111)
Creatinine, Ser: 2.51 mg/dL — ABNORMAL HIGH (ref 0.44–1.00)
GFR calc Af Amer: 20 mL/min — ABNORMAL LOW (ref >60)
GFR calc non Af Amer: 17 mL/min — ABNORMAL LOW (ref >60)
Glucose, Bld: 120 mg/dL — ABNORMAL HIGH (ref 70–99)
Potassium: 4.4 mmol/L (ref 3.5–5.1)
Sodium: 141 mmol/L (ref 135–145)

## 2018-12-27 LAB — MAGNESIUM: Magnesium: 2 mg/dL (ref 1.7–2.4)

## 2018-12-27 MED ORDER — CEFTRIAXONE IV (FOR PTA / DISCHARGE USE ONLY): 2.0000 g | Freq: Two times a day (BID) | INTRAVENOUS | 0 refills | Status: DC

## 2018-12-27 MED ORDER — METRONIDAZOLE IN NACL 5-0.79 MG/ML-% IV SOLN
500.0000 mg | Freq: Three times a day (TID) | INTRAVENOUS | Status: DC
  Administered 2018-12-27 – 2018-12-29 (×7): 500 mg via INTRAVENOUS
  Filled 2018-12-27 (×7): qty 100

## 2018-12-27 NOTE — Progress Notes (Signed)
Occupational Therapy Treatment Patient Details Name: Autumn [name] MRN: [id number] DOB: [birth year] Today's Date: 12/27/2018    History of present illness 85 y.o. female with fairly complex recent past medical history including admission from 11/12/2018-11/26/2018 during which she was treated for acute ischemic left cerebellar CVA, MSSA bacteremia with signs of posterior mitral valve vegetation consistent with endocarditis with TEE on 11/20/2018 treated with IV Rocephin (last date tentatively 11/17) discharged to SNF, who presented 11/4 with altered mental status. She was found to be febrile and admitted for sepsis secondary to HCAP on vanc and cefepime, also found to have PE and started on heparin drip and new ischemic strokes. 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 Patient's son states patient is having a "good day today." Patient is awake/more alert this session than previous. Patient require mod A x2 for bed mobility with max cues due to weakness, limited initiation. Attempt to have patient perform grooming and deep breathing exercise herself while seated edge of bed however patient requiring bilateral upper extremity support to maintain balance. Patient was total A for combing her hair. OT hold incentive spirometer while PT provide trunk stability and cue patient for taking deep breaths. At end of session provide education to son to encourage patient to perform upper extremity exercises throughout the day as well as using incentive spirometer.   Follow Up Recommendations  SNF;Supervision/Assistance - 24 hour    Equipment Recommendations  Other (comment)(to be determined at next venue)        Precautions / Restrictions Precautions Precautions: Fall Restrictions Weight Bearing Restrictions: No       Mobility Bed Mobility Overal  bed mobility: Needs Assistance Bed Mobility: Rolling Rolling: Mod assist;+2 for physical assistance;+2 for safety/equipment     Sit to supine: Mod assist;+2 for safety/equipment;+2 for physical assistance   General bed mobility comments: max verbal, tactile, visual cues for sequencing and to maintain sitting balance while seated at edge of bed  Transfers                 General transfer comment: unable to perform transfer this session    Balance Overall balance assessment: Needs assistance Sitting-balance support: Bilateral upper extremity supported;Feet supported Sitting balance-Leahy Scale: Poor         Standing balance comment: unable                           ADL either performed or assessed with clinical judgement   ADL       Grooming: Total assistance;Sitting Grooming Details (indicate cue type and reason): due to limited sitting balance patient require total assistance to comb her hair                             Functional mobility during ADLs: Minimal assistance;Moderate assistance(to maintain sitting balance) General ADL Comments: max to total A for all ADLs at this time due to pain, decreased sitting balance, weakness                Cognition Arousal/Alertness: Awake/alert Behavior During Therapy: Flat affect Overall Cognitive Status: Impaired/Different from baseline Area of Impairment: Safety/judgement;Following commands                       Following  Commands: Follows one step commands inconsistently;Follows one step commands with increased time Safety/Judgement: Decreased awareness of safety              Exercises Exercises: General Upper Extremity General Exercises - Upper Extremity Shoulder Flexion: AAROM;Both;5 reps;Supine Shoulder ABduction: AAROM;Both;5 reps;Supine Elbow Flexion: AAROM;Both;5 reps;Supine Digit Composite Flexion: AROM;Both;10 reps;Supine            Pertinent Vitals/ Pain        Pain Assessment: Faces Faces Pain Scale: Hurts even more Pain Location: low back Pain Descriptors / Indicators: Moaning;Grimacing;Guarding Pain Intervention(s): Limited activity within patient's tolerance;Monitored during session;Premedicated before session;Repositioned           Frequency  Min 2X/week        Progress Toward Goals  OT Goals(current goals can now be found in the care plan section)  Progress towards OT goals: Progressing toward goals  Acute Rehab OT Goals Patient Stated Goal: to rest OT Goal Formulation: With patient Time For Goal Achievement: 01/10/19 Potential to Achieve Goals: Good ADL Goals Pt Will Perform Grooming: with set-up;sitting Pt Will Perform Upper Body Bathing: with min guard assist;sitting Pt Will Perform Upper Body Dressing: with set-up;sitting Pt Will Perform Lower Body Dressing: with min assist;with adaptive equipment;sit to/from stand Pt Will Transfer to Toilet: with min assist;ambulating;bedside commode Pt/caregiver will Perform Home Exercise Program: Increased strength;Increased ROM;Left upper extremity;With Supervision;With written HEP provided  Plan Discharge plan remains appropriate    Co-evaluation    PT/OT/SLP Co-Evaluation/Treatment: Yes Reason for Co-Treatment: Complexity of the patient's impairments (multi-system involvement);For patient/therapist safety;To address functional/ADL transfers   OT goals addressed during session: ADL's and self-care      AM-PAC OT "6 Clicks" Daily Activity     Outcome Measure   Help from another person eating meals?: A Little Help from another person taking care of personal grooming?: Total Help from another person toileting, which includes using toliet, bedpan, or urinal?: Total Help from another person bathing (including washing, rinsing, drying)?: Total Help from another person to put on and taking off regular upper body clothing?: Total Help from another person to put on and taking  off regular lower body clothing?: Total 6 Click Score: 8    End of Session   OT Visit Diagnosis: Pain;Unsteadiness on feet (R26.81);Other abnormalities of gait and mobility (R26.89);Muscle weakness (generalized) (M62.81) Pain - part of body: (Back)   Activity Tolerance Treatment limited secondary to medical complications (Comment)(O2 desaturation)   Patient Left in bed;with call bell/phone within reach;with bed alarm set;with family/visitor present            Time: 1448-1856 OT Time Calculation (min): 47 min  Charges: OT General Charges $OT Visit: 1 Visit OT Treatments $Self Care/Home Management : 8-22 mins  [place] OT office: [place] 12/27/2018, 1:24 PM

## 2018-12-27 NOTE — Progress Notes (Signed)
Physical Therapy Treatment Patient Details Name: Autumn [name] MRN: [id number] DOB: 1932/11/08 Today's Date: 12/27/2018    History of Present Illness 85 y.o. female with fairly complex recent past medical history including admission from 11/12/2018-11/26/2018 during which she was treated for acute ischemic left cerebellar CVA, MSSA bacteremia with signs of posterior mitral valve vegetation consistent with endocarditis with TEE on 11/20/2018 treated with IV Rocephin (last date tentatively 11/17) discharged to SNF, who presented 11/4 with altered mental status. She was found to be febrile and admitted for sepsis secondary to HCAP on vanc and cefepime, also found to have PE and started on heparin drip and new ischemic strokes. 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  Pt with HR 124 bpm and pulse to 79% when sitting.  Tried to have pt do inspirometer and only able to get it to 300-400 ml.  Pt started out alert and got tired and sleepy as session progressed (due to premedications).  pts son present - educated him on PROM exercises and inspirometer and positioning.  I agree with SNF level care at DC as pt needs +2 assist for all mobilty with limited participation.   Follow Up Recommendations  SNF;Supervision/Assistance - 24 hour     Equipment Recommendations  None recommended by PT    Recommendations for Other Services       Precautions / Restrictions Precautions Precautions: Fall;Back Precaution Comments: severe back pain, decub right heel Required Braces or Orthoses: Spinal Brace(for comfort only - for pain management) Restrictions Weight Bearing Restrictions: No    Mobility  Bed Mobility Overal bed mobility: Needs Assistance Bed Mobility: Rolling;Sidelying to Sit;Sit  to Sidelying Rolling: Mod assist;+2 for physical assistance Sidelying to sit: Mod assist;+2 for physical assistance   Sit to supine: Mod assist;+2 for safety/equipment;+2 for physical assistance Sit to sidelying: +2 for physical assistance;Total assist General bed mobility comments: pt sat up EOB - with feet on floor and UEs supporting her.  pt leaned on pillow for support - cued for deeper breathing and holding her head up.  pts HR to 124 bpm and pts O2 sats dropped to 78% even with 3L O2 on.  pt encouraged to sit up as long as she could - could only tolerate 5 minutes with +2 assist.  we tried to have pt do inspirometer (with therapist holding it) and pt only able to breath 300-400 ml at most.  Encouaged deep breathing.  Transfers                 General transfer comment: unable to perform transfer this session  Ambulation/Gait             General Gait Details: unable   Stairs             Wheelchair Mobility    Modified Rankin (Stroke Patients Only)       Balance Overall balance assessment: Needs assistance Sitting-balance support: Bilateral upper extremity supported;Feet supported Sitting balance-Leahy Scale: Poor Sitting balance - Comments: tried to have pt sit with support to see if she could tolerate sitting longer - seh could not - only 5 min       Standing balance comment: unable  Cognition Arousal/Alertness: Awake/alert;Lethargic;Suspect due to medications Behavior During Therapy: Flat affect Overall Cognitive Status: Impaired/Different from baseline Area of Impairment: Safety/judgement;Following commands                       Following Commands: Follows one step commands inconsistently;Follows one step commands with increased time Safety/Judgement: Decreased awareness of safety     General Comments: pt with HR 110-124 durig session today. p t has full body edema - encouraged movement.  pt hasnt  had pain meds today - doenst want if she doesnt move but she did have meds prior to therapy -s he started out alert but after sitting EOB 5 minutes and laying back down - her eyes were closed.      Exercises General Exercises - Upper Extremity Shoulder Flexion: AAROM;Both;5 reps;Supine Shoulder ABduction: AAROM;Both;5 reps;Supine Elbow Flexion: AAROM;Both;5 reps;Supine Digit Composite Flexion: AROM;Both;10 reps;Supine Other Exercises Other Exercises: OT did UE exercises wth pt - shoulder, elbow, wrist and grip Other Exercises: PT - supine - PROM - hip flexion, knee flexion and DF stretches.  I did some gentle massage of tendons in feet to help pt get improved DF - pt tight in her arch and toes flexors.    General Comments General comments (skin integrity, edema, etc.): Pts son present for therapy today - educated him on PROM /AAROM exercises of arms and legs.  Also instructed him on positioning - for skin care and eating/drinking and on the inspirometer.  he had no questions but thought pt lookig better to him today      Pertinent Vitals/Pain Pain Assessment: Faces Faces Pain Scale: Hurts even more Pain Location: low back Pain Descriptors / Indicators: Moaning;Grimacing;Guarding Pain Intervention(s): Monitored during session;Limited activity within patient's tolerance;Repositioned(pt reports she felt better after therapy - after moving around)    Home Living                      Prior Function            PT Goals (current goals can now be found in the care plan section) Acute Rehab PT Goals Patient Stated Goal: to rest Progress towards PT goals: Progressing toward goals    Frequency    Min 2X/week      PT Plan Current plan remains appropriate    Co-evaluation PT/OT/SLP Co-Evaluation/Treatment: Yes Reason for Co-Treatment: Complexity of the patient's impairments (multi-system involvement);For patient/therapist safety PT goals addressed during session:  Mobility/safety with mobility;Balance OT goals addressed during session: ADL's and self-care      AM-PAC PT "6 Clicks" Mobility   Outcome Measure  Help needed turning from your back to your side while in a flat bed without using bedrails?: Total Help needed moving from lying on your back to sitting on the side of a flat bed without using bedrails?: Total Help needed moving to and from a bed to a chair (including a wheelchair)?: Total Help needed standing up from a chair using your arms (e.g., wheelchair or bedside chair)?: Total Help needed to walk in hospital room?: Total Help needed climbing 3-5 steps with a railing? : Total 6 Click Score: 6    End of Session   Activity Tolerance: Patient limited by fatigue(Limited by vitals - tachycardia and dropping O2 sats.) Patient left: in bed;with call bell/phone within reach;with SCD's reapplied;with family/visitor present;with bed alarm set Nurse Communication: Mobility status;Need for lift equipment;Precautions;Patient requests pain meds PT Visit Diagnosis: Muscle weakness (generalized) (M62.81);Difficulty in walking, not  elsewhere classified (R26.2);Other abnormalities of gait and mobility (R26.89);Pain     Time: 1210-1240 PT Time Calculation (min) (ACUTE ONLY): 30 min  Charges:  $Therapeutic Activity: 8-22 mins                     12/27/2018   Rande Lawman, PT    [place]oyal [place] 12/27/2018, 2:24 PM

## 2018-12-27 NOTE — Progress Notes (Signed)
[facility]   Reason for visit: Follow up on MSSA bacteremia  Interval History: son at bedside.  WBC up to 18.2.  Son reports some increased WOB. Remains afebrile otherwise.  No associated rash or diarrhea.    Physical Exam: Constitutional:  Vitals:   12/27/18 0400 12/27/18 0818  BP: 90/66 113/66  Pulse: 79 (!) 113  Resp: 20 (!) 22  Temp: 98.6 F (37 C) 98 F (36.7 C)  SpO2: 100% 96%   patient appears in NAD Respiratory: mild increase of her respiratory rate; CTA B Cardiovascular: RRR GI: soft, nt, nd  Review of Systems: Constitutional: negative for fevers and chills Gastrointestinal: negative for nausea and diarrhea Integument/breast: negative for rash  Lab Results  Component Value Date   WBC 18.2 (H) 12/27/2018   HGB 10.0 (L) 12/27/2018   HCT 31.1 (L) 12/27/2018   MCV 102.0 (H) 12/27/2018   PLT 322 12/27/2018    Lab Results  Component Value Date   CREATININE 2.87 (H) 12/26/2018   BUN 39 (H) 12/26/2018   NA 136 12/26/2018   K 4.7 12/26/2018   CL 109 12/26/2018   CO2 15 (L) 12/26/2018    Lab Results  Component Value Date   ALT 28 12/20/2018   AST 33 12/20/2018   ALKPHOS 113 12/20/2018     Microbiology: Recent Results (from the past 240 hour(s))  Blood Culture (routine x 2)     Status: None   Collection Time: 12/18/18 10:47 AM   Specimen: BLOOD RIGHT ARM  Result Value Ref Range Status   Specimen Description BLOOD RIGHT ARM  Final   Special Requests   Final    BOTTLES DRAWN AEROBIC AND ANAEROBIC Blood Culture adequate volume   Culture   Final    NO GROWTH 5 DAYS Performed at [facility], [address], [place], [place] [address]    Report Status 12/23/2018 FINAL  Final  SARS Coronavirus 2 by RT PCR (hospital order, performed in [facility] hospital lab) Nasopharyngeal Nasopharyngeal Swab     Status: None   Collection Time: 12/18/18 12:13 PM   Specimen: Nasopharyngeal Swab  Result Value Ref Range Status   SARS  Coronavirus 2 NEGATIVE NEGATIVE Final    Comment: (NOTE) If result is NEGATIVE SARS-CoV-2 target nucleic acids are NOT DETECTED. The SARS-CoV-2 RNA is generally detectable in upper and lower  respiratory specimens during the acute phase of infection. The lowest  concentration of SARS-CoV-2 viral copies this assay can detect is 250  copies / mL. A negative result does not preclude SARS-CoV-2 infection  and should not be used as the sole basis for treatment or other  patient management decisions.  A negative result may occur with  improper specimen collection / handling, submission of specimen other  than nasopharyngeal swab, presence of viral mutation(s) within the  areas targeted by this assay, and inadequate number of viral copies  (<250 copies / mL). A negative result must be combined with clinical  observations, patient history, and epidemiological information. If result is POSITIVE SARS-CoV-2 target nucleic acids are DETECTED. The SARS-CoV-2 RNA is generally detectable in upper and lower  respiratory specimens dur ing the acute phase of infection.  Positive  results are indicative of active infection with SARS-CoV-2.  Clinical  correlation with patient history and other diagnostic information is  necessary to determine patient infection status.  Positive results do  not rule out bacterial infection or co-infection with other viruses. If result  is PRESUMPTIVE POSTIVE SARS-CoV-2 nucleic acids MAY BE PRESENT.   A presumptive positive result was obtained on the submitted specimen  and confirmed on repeat testing.  While 2019 novel coronavirus  (SARS-CoV-2) nucleic acids may be present in the submitted sample  additional confirmatory testing may be necessary for epidemiological  and / or clinical management purposes  to differentiate between  SARS-CoV-2 and other Sarbecovirus currently known to infect humans.  If clinically indicated additional testing with an alternate test   methodology [id number]) is advised. The SARS-CoV-2 RNA is generally  detectable in upper and lower respiratory sp ecimens during the acute  phase of infection. The expected result is Negative. Fact Sheet for Patients:  StrictlyIdeas.no Fact Sheet for Healthcare Providers: BankingDealers.co.za This test is not yet approved or cleared by the [place] FDA and has been authorized for detection and/or diagnosis of SARS-CoV-2 by FDA under an Emergency Use Authorization (EUA).  This EUA will remain in effect (meaning this test can be used) for the duration of the COVID-19 declaration under Section 564(b)(1) of the Act, 21 U.S.C. section 360bbb-3(b)(1), unless the authorization is terminated or revoked sooner. Performed at [facility], [place] [address], [place], [place] [address]   Blood Culture (routine x 2)     Status: None   Collection Time: 12/18/18 12:31 PM   Specimen: BLOOD RIGHT HAND  Result Value Ref Range Status   Specimen Description BLOOD RIGHT HAND  Final   Special Requests   Final    BOTTLES DRAWN AEROBIC ONLY Blood Culture results may not be optimal due to an inadequate volume of blood received in culture bottles   Culture   Final    NO GROWTH 5 DAYS Performed at [facility], [place] [address], [place], [place] [address]    Report Status 12/23/2018 FINAL  Final  Urine culture     Status: Abnormal   Collection Time: 12/18/18  3:26 PM   Specimen: In/Out Cath Urine  Result Value Ref Range Status   Specimen Description IN/OUT CATH URINE  Final   Special Requests   Final    NONE Performed at [facility], [place] [address], [place], [place] [address]    Culture 20,000 COLONIES/mL YEAST (A)  Final   Report Status 12/19/2018 FINAL  Final  MRSA PCR Screening     Status: None   Collection Time: 12/20/18 12:50 AM   Specimen: Nasal Mucosa; Nasopharyngeal  Result Value Ref Range Status   MRSA by PCR NEGATIVE NEGATIVE  Final    Comment:        The GeneXpert MRSA Assay (FDA approved for NASAL specimens only), is one component of a comprehensive MRSA colonization surveillance program. It is not intended to diagnose MRSA infection nor to guide or monitor treatment for MRSA infections. Performed at [facility], [place] [address], [place], [place] [address]     Impression/Plan:  1. Discitis/osteomyelitis with epidural abscess - she will continue on ceftriaxone high dose through 12/15.    2.  AKI - no result yet today.  Improved yesterday.   3.  Mitral valve vegetation - on ceftriaxone as above.    4.  Embolic stroke - weak.  Some improvement though overall with her mentation.   5.  Pulmonary infiltrates - bilateral.  She is on a oxygen now.  I will broaden to Flagyl with the ceftriaxone.

## 2018-12-27 NOTE — Progress Notes (Signed)
MEWS/VS Documentation      12/27/2018 0000 12/27/2018 0400 12/27/2018 0700 12/27/2018 0818   MEWS Score:  1  1  1  3    MEWS Score Color:  Green  Green  Green  Yellow   Resp:  18  20  -  (!) 22   Pulse:  99  79  -  (!) 113   BP:  94/67  90/66  -  113/66   Temp:  98.5 F (36.9 C)  98.6 F (37 C)  -  98 F (36.7 C)   O2 Device:  Nasal Cannula  Nasal Cannula  -  Nasal Cannula   O2 Flow Rate (L/min):  4 L/min  4 L/min  -  4 L/min   Level of Consciousness:  Alert  -  -  Alert    Patient is receiving am bath.  HR and RR increased.  Patient assessed.  No obvious distress noted.

## 2018-12-27 NOTE — Progress Notes (Signed)
MEWS/VS Documentation      12/27/2018 0400 12/27/2018 0700 12/27/2018 0818 12/27/2018 1200   MEWS Score:  1  1  3  3    MEWS Score Color:  Green  Green  Yellow  Yellow   Resp:  20  -  (!) 22  -   Pulse:  79  -  (!) 113  -   BP:  90/66  -  113/66  -   Temp:  98.6 F (37 C)  -  98 F (36.7 C)  -   O2 Device:  Nasal Cannula  -  Nasal Cannula  -   O2 Flow Rate (L/min):  4 L/min  -  4 L/min  -   Level of Consciousness:  -  -  Alert  Alert    Patient appears calm and comfortable.  MEWS score remains elevated d//t RR & HR.  Patient frequently sleeping.  No apparent distress.  Monitoring continues.

## 2018-12-27 NOTE — Progress Notes (Signed)
PROGRESS NOTE    Autumn [name]  HYQ:[id number] DOB: [birth year] DOA: 12/18/2018 PCP: Merlene LaughterStoneking, Hal, MD   Brief Narrative: Autumn KillianKaren M [name] is a 85 y.o. female with history of hypertension, chronic systolic CHF, hypothyroidism, left bundle branch block, peripheral neuropathy. Patient presented secondary to altered mental status with concern for sepsis. Found to have multiple issues including PE, discitis/osteomyelitis, pneumonia, CVA.   Assessment & Plan:   Principal Problem:   Sepsis (HCC) Active Problems:   MSSA bacteremia   Acute on chronic combined systolic and diastolic CHF (congestive heart failure) (HCC)   HCAP (healthcare-associated pneumonia)   Acute metabolic encephalopathy   Macrocytic anemia   Acute respiratory failure with hypoxia (HCC)   Pressure injury of skin   Acute septic pulmonary embolism without acute cor pulmonale (HCC)   Spinal epidural abscess  Sepsis Present on admission and likely secondary to HCAP vs discitis/osteomyelitis. Initially improved, but now meeting criteria again in setting of worsening pulmonary infiltrates.  Acute L1-L2 discitis/osteomyelitis Possible epidural abscess/bilateral psoas abscesses Neurosurgery consulted and recommended conservative management with antibiotics. ID recommended nafcillin which was switched to Ceftriaxone on 11/9.  History of MSSA bacteremia Probable mitral valve endocarditis. Repeat blood cultures negative.  Multifocal infiltrates Chest x-ray concerning for multifocal pneumonia. No procalcitonin ordered secondary to underlying systemic infection. Antibiotics switched to Zosyn to for broader coverage. Switched back to Ceftriaxone on 11/12. -Continue Ceftriaxone  Acute ischemic stroke Neurology was consulted. CTA head and neck stable. LDL of 89 and hemoglobin A1C of 5.9%. -Continue Lipitor, Eliquis  Pulmonary emboli Small emboli of bilateral lungs with mild heart strain on imaging. Started on Eliquis -Continue  Eliquis  Edema Patient has been on IV fluids. -Discontinue IV fluids  AKI New with mild increase in BUN and creatinine peak of 3.27. Likely secondary to nafcillin which has been discontinued.  Chronic combined systolic and diastolic heart failure Hypervolemic from IV fluids. EF improved to 55-60%. Does not appear to have heart failure symptoms at this time. -Daily weights -Keep negative fluid balance as vitals allow  Delirium Appears improved.  Hypokalemia hypomagnesemia Replete as needed  Hypoalbuminemia Dietician recommendations   DVT prophylaxis: Eliquis Code Status:   Code Status: DNR Family Communication: None at bedside Disposition Plan: Discharge pending clinical improvement   Consultants:   Neurology  ID  Palliative care  Neurosurgery  Procedures:   Echo  1. Left ventricular ejection fraction, by visual estimation, is 55 to 60%. The left ventricle has normal function. There is mildly increased left ventricular hypertrophy. 2. Global right ventricle has normal systolic function.The right ventricular size is normal. No increase in right ventricular wall thickness. 3. Left atrium is mildly dialted. 4. The mitral valve is abnormal. Mild mitral valve regurgitation. There is moderate calcification of the mitral valve leaflets. I do not see an obvious vegetation but image qualite is poor. 5. Right atrial size was normal. 6. The aortic valve was not well visualized. Aortic valve regurgitation is not visualized. 7. The pulmonic valve was not well visualized. Pulmonic valve regurgitation is not visualized. 8. Abnormal septal motion consistent with left bundle branch block. 9. The inferior vena cava is normal in size with greater than 50% respiratory variability, suggesting right atrial pressure of 3 mmHg. 10. Limited echo with poor images. 11. Moderate calcification of the mitral valve leaflet(s). 12. The tricuspid valve is normal in structure.  Tricuspid valve regurgitation is trivial.  Antimicrobials:  Vancomycin  Cefepime  Nafcillin  Ceftriaxone  Zosyn    Subjective: No  concerns today. No pain. Tired.  Objective: Vitals:   12/26/18 2037 12/27/18 0000 12/27/18 0400 12/27/18 0818  BP: 119/84 94/67 90/66  113/66  Pulse: 100 99 79 (!) 113  Resp: 20 18 20  (!) 22  Temp: 98 F (36.7 C) 98.5 F (36.9 C) 98.6 F (37 C) 98 F (36.7 C)  TempSrc: Oral Oral Oral Oral  SpO2: 99% 100% 100% 96%  Weight:      Height:        Intake/Output Summary (Last 24 hours) at 12/27/2018 0916 Last data filed at 12/27/2018 0500 Gross per 24 hour  Intake 2312.69 ml  Output 2625 ml  Net -312.31 ml   Filed Weights   12/24/18 0409 12/25/18 0330 12/26/18 0356  Weight: 72 kg 75.6 kg 74.1 kg    Examination:  General exam: Appears calm and comfortable Respiratory system: Mild wheezes with mildly increased supraclavicular retraction Cardiovascular system: S1 & S2 heard, Fast rate, regular rhythm. No murmurs, rubs, gallops or clicks. Gastrointestinal system: Abdomen is nondistended, soft and nontender. No organomegaly or masses felt. Normal bowel sounds heard. Central nervous system: Alert and oriented. No focal neurological deficits. Extremities: Bilateral thigh edema. No calf tenderness Skin: No cyanosis. No rashes Psychiatry: Judgement and insight appear normal. Flat affect. Possibly depressed mood.    Data Reviewed: I have personally reviewed following labs and imaging studies  CBC: Recent Labs  Lab 12/22/18 0330 12/23/18 0307 12/24/18 0252 12/25/18 1148 12/26/18 0317  WBC 15.5* 16.2* 17.0* 16.7* 13.7*  NEUTROABS  --  8.2* 13.0* 10.8*  --   HGB 10.3* 10.1* 11.1* 11.1* 9.5*  HCT 31.7* 31.0* 33.5* 33.5* 29.8*  MCV 97.2 98.1 96.5 98.5 99.0  PLT 272 305 313 PLATELET CLUMPS NOTED ON SMEAR, COUNT APPEARS ADEQUATE 304   Basic Metabolic Panel: Recent Labs  Lab 12/22/18 0330 12/23/18 0307 12/24/18 0252 12/25/18 0236  12/26/18 0317 12/27/18 0234  NA 132* 134* 134* 134* 136  --   K 3.8 4.1 4.5 4.1 4.7  --   CL 100 100 102 106 109  --   CO2 21* 21* 19* 18* 15*  --   GLUCOSE 181* 137* 117* 143* 127*  --   BUN 25* 34* 34* 37* 39*  --   CREATININE 2.49* 3.27* 3.21* 3.01* 2.87*  --   CALCIUM 7.6* 7.3* 7.9* 7.8* 8.1*  --   MG 2.5* 2.4 2.1 1.9 1.9 2.0   GFR: Estimated Creatinine Clearance: 14.1 mL/min (A) (by C-G formula based on SCr of 2.87 mg/dL (H)). Liver Function Tests: No results for input(s): AST, ALT, ALKPHOS, BILITOT, PROT, ALBUMIN in the last 168 hours. No results for input(s): LIPASE, AMYLASE in the last 168 hours. No results for input(s): AMMONIA in the last 168 hours. Coagulation Profile: No results for input(s): INR, PROTIME in the last 168 hours. Cardiac Enzymes: No results for input(s): CKTOTAL, CKMB, CKMBINDEX, TROPONINI in the last 168 hours. BNP (last 3 results) No results for input(s): PROBNP in the last 8760 hours. HbA1C: No results for input(s): HGBA1C in the last 72 hours. CBG: No results for input(s): GLUCAP in the last 168 hours. Lipid Profile: No results for input(s): CHOL, HDL, LDLCALC, TRIG, CHOLHDL, LDLDIRECT in the last 72 hours. Thyroid Function Tests: No results for input(s): TSH, T4TOTAL, FREET4, T3FREE, THYROIDAB in the last 72 hours. Anemia Panel: No results for input(s): VITAMINB12, FOLATE, FERRITIN, TIBC, IRON, RETICCTPCT in the last 72 hours. Sepsis Labs: No results for input(s): PROCALCITON, LATICACIDVEN in the last 168 hours.  Recent  Results (from the past 240 hour(s))  Blood Culture (routine x 2)     Status: None   Collection Time: 12/18/18 10:47 AM   Specimen: BLOOD RIGHT ARM  Result Value Ref Range Status   Specimen Description BLOOD RIGHT ARM  Final   Special Requests   Final    BOTTLES DRAWN AEROBIC AND ANAEROBIC Blood Culture adequate volume   Culture   Final    NO GROWTH 5 DAYS Performed at [facility], [address], [place],  [place] [address]    Report Status 12/23/2018 FINAL  Final  SARS Coronavirus 2 by RT PCR (hospital order, performed in [facility] hospital lab) Nasopharyngeal Nasopharyngeal Swab     Status: None   Collection Time: 12/18/18 12:13 PM   Specimen: Nasopharyngeal Swab  Result Value Ref Range Status   SARS Coronavirus 2 NEGATIVE NEGATIVE Final    Comment: (NOTE) If result is NEGATIVE SARS-CoV-2 target nucleic acids are NOT DETECTED. The SARS-CoV-2 RNA is generally detectable in upper and lower  respiratory specimens during the acute phase of infection. The lowest  concentration of SARS-CoV-2 viral copies this assay can detect is 250  copies / mL. A negative result does not preclude SARS-CoV-2 infection  and should not be used as the sole basis for treatment or other  patient management decisions.  A negative result may occur with  improper specimen collection / handling, submission of specimen other  than nasopharyngeal swab, presence of viral mutation(s) within the  areas targeted by this assay, and inadequate number of viral copies  (<250 copies / mL). A negative result must be combined with clinical  observations, patient history, and epidemiological information. If result is POSITIVE SARS-CoV-2 target nucleic acids are DETECTED. The SARS-CoV-2 RNA is generally detectable in upper and lower  respiratory specimens dur ing the acute phase of infection.  Positive  results are indicative of active infection with SARS-CoV-2.  Clinical  correlation with patient history and other diagnostic information is  necessary to determine patient infection status.  Positive results do  not rule out bacterial infection or co-infection with other viruses. If result is PRESUMPTIVE POSTIVE SARS-CoV-2 nucleic acids MAY BE PRESENT.   A presumptive positive result was obtained on the submitted specimen  and confirmed on repeat testing.  While 2019 novel coronavirus  (SARS-CoV-2) nucleic acids may be present in the  submitted sample  additional confirmatory testing may be necessary for epidemiological  and / or clinical management purposes  to differentiate between  SARS-CoV-2 and other Sarbecovirus currently known to infect humans.  If clinically indicated additional testing with an alternate test  methodology [id number]) is advised. The SARS-CoV-2 RNA is generally  detectable in upper and lower respiratory sp ecimens during the acute  phase of infection. The expected result is Negative. Fact Sheet for Patients:  StrictlyIdeas.no Fact Sheet for Healthcare Providers: BankingDealers.co.za This test is not yet approved or cleared by the [place] FDA and has been authorized for detection and/or diagnosis of SARS-CoV-2 by FDA under an Emergency Use Authorization (EUA).  This EUA will remain in effect (meaning this test can be used) for the duration of the COVID-19 declaration under Section 564(b)(1) of the Act, 21 U.S.C. section 360bbb-3(b)(1), unless the authorization is terminated or revoked sooner. Performed at [facility], [place] [address], [place], [place] [address]   Blood Culture (routine x 2)     Status: None   Collection Time: 12/18/18 12:31 PM   Specimen: BLOOD RIGHT  HAND  Result Value Ref Range Status   Specimen Description BLOOD RIGHT HAND  Final   Special Requests   Final    BOTTLES DRAWN AEROBIC ONLY Blood Culture results may not be optimal due to an inadequate volume of blood received in culture bottles   Culture   Final    NO GROWTH 5 DAYS Performed at [facility], [address], [place], [place] [address]    Report Status 12/23/2018 FINAL  Final  Urine culture     Status: Abnormal   Collection Time: 12/18/18  3:26 PM   Specimen: In/Out Cath Urine  Result Value Ref Range Status   Specimen Description IN/OUT CATH URINE  Final   Special Requests   Final    NONE Performed at [facility], [address]  [place], [place] [address]    Culture 20,000 COLONIES/mL YEAST (A)  Final   Report Status 12/19/2018 FINAL  Final  MRSA PCR Screening     Status: None   Collection Time: 12/20/18 12:50 AM   Specimen: Nasal Mucosa; Nasopharyngeal  Result Value Ref Range Status   MRSA by PCR NEGATIVE NEGATIVE Final    Comment:        The GeneXpert MRSA Assay (FDA approved for NASAL specimens only), is one component of a comprehensive MRSA colonization surveillance program. It is not intended to diagnose MRSA infection nor to guide or monitor treatment for MRSA infections. Performed at [facility], [address] [place], [place] [address]          Radiology Studies: Dg Chest 1 View  Result Date: 12/25/2018 CLINICAL DATA:  Dyspnea, tachypnea; history GERD, hypertension, CHF, congestive dilated cardiomyopathy EXAM: CHEST  1 VIEW COMPARISON:  Portable exam of T5 hours compared to 12/18/2018 FINDINGS: Normal heart size, mediastinal contours, and pulmonary vascularity. Atherosclerotic calcification aorta. Patchy BILATERAL pulmonary infiltrates greatest in RIGHT upper lobe consistent with multifocal pneumonia. Question minimal bibasilar effusions. No pneumothorax. Bones demineralized. IMPRESSION: Patchy BILATERAL pulmonary infiltrates most consistent with pneumonia, greatest RIGHT upper lobe. Electronically Signed   By: Ulyses Southward M.D.   On: 12/25/2018 13:04        Scheduled Meds: . apixaban  10 mg Oral BID   Followed by  . [START ON 12/28/2018] apixaban  5 mg Oral BID  . atorvastatin  20 mg Oral QHS  . DULoxetine  30 mg Oral Daily  . feeding supplement (ENSURE ENLIVE)  237 mL Oral TID BM  . LORazepam  0.5 mg Intravenous QHS  . mouth rinse  15 mL Mouth Rinse BID  . multivitamin with minerals  1 tablet Oral Daily  . sodium chloride flush  3 mL Intravenous Q12H  . traZODone  50 mg Oral QHS   Continuous Infusions: . sodium chloride 10 mL/hr at 12/26/18 0616  . cefTRIAXone (ROCEPHIN)  IV 2  g (12/26/18 2133)  . sodium chloride 250 mL (12/26/18 2142)     LOS: 9 days     Jacquelin Hawking, MD [facility] 12/27/2018, 9:16 AM  If 7PM-7AM, please contact night-coverage [email or web address]

## 2018-12-27 NOTE — Progress Notes (Signed)
PHARMACY CONSULT NOTE FOR:  OUTPATIENT  PARENTERAL ANTIBIOTIC THERAPY (OPAT)  Indication: MSSA bacteremia/disckitis with CNS septic emboli Regimen: Ceftriaxone 2 gm every 12 hours  End date: 01/28/2019  IV antibiotic discharge orders are pended. To discharging provider:  please sign these orders via discharge navigator,  Select New Orders & click on the button choice - Manage This Unsigned Work.     Thank you for allowing pharmacy to be a part of this patient's care.  Jimmy Footman, PharmD, BCPS, [facility] Clinical Pharmacist Phone: [phone] 12/27/2018, 11:22 AM

## 2018-12-28 DIAGNOSIS — A021 Salmonella sepsis: Secondary | ICD-10-CM

## 2018-12-28 DIAGNOSIS — G7281 Critical illness myopathy: Secondary | ICD-10-CM

## 2018-12-28 DIAGNOSIS — R6520 Severe sepsis without septic shock: Secondary | ICD-10-CM

## 2018-12-28 DIAGNOSIS — I330 Acute and subacute infective endocarditis: Secondary | ICD-10-CM

## 2018-12-28 LAB — CBC
HCT: 38.5 % (ref 36.0–46.0)
Hemoglobin: 12 g/dL (ref 12.0–15.0)
MCH: 31.9 pg (ref 26.0–34.0)
MCHC: 31.2 g/dL (ref 30.0–36.0)
MCV: 102.4 fL — ABNORMAL HIGH (ref 80.0–100.0)
Platelets: 264 10*3/uL (ref 150–400)
RBC: 3.76 MIL/uL — ABNORMAL LOW (ref 3.87–5.11)
RDW: 18.5 % — ABNORMAL HIGH (ref 11.5–15.5)
WBC: 11.4 10*3/uL — ABNORMAL HIGH (ref 4.0–10.5)
nRBC: 0.8 % — ABNORMAL HIGH (ref 0.0–0.2)

## 2018-12-28 LAB — BASIC METABOLIC PANEL
Anion gap: 10 (ref 5–15)
BUN: 33 mg/dL — ABNORMAL HIGH (ref 8–23)
CO2: 20 mmol/L — ABNORMAL LOW (ref 22–32)
Calcium: 8.2 mg/dL — ABNORMAL LOW (ref 8.9–10.3)
Chloride: 109 mmol/L (ref 98–111)
Creatinine, Ser: 2.21 mg/dL — ABNORMAL HIGH (ref 0.44–1.00)
GFR calc Af Amer: 23 mL/min — ABNORMAL LOW (ref >60)
GFR calc non Af Amer: 20 mL/min — ABNORMAL LOW (ref >60)
Glucose, Bld: 114 mg/dL — ABNORMAL HIGH (ref 70–99)
Potassium: 4.6 mmol/L (ref 3.5–5.1)
Sodium: 139 mmol/L (ref 135–145)

## 2018-12-28 LAB — MAGNESIUM: Magnesium: 1.8 mg/dL (ref 1.7–2.4)

## 2018-12-28 MED ORDER — FUROSEMIDE 10 MG/ML IJ SOLN
40.0000 mg | Freq: Two times a day (BID) | INTRAMUSCULAR | Status: DC
  Administered 2018-12-28 – 2018-12-29 (×2): 40 mg via INTRAVENOUS
  Filled 2018-12-28 (×3): qty 4

## 2018-12-28 NOTE — Progress Notes (Addendum)
Daily Progress Note   Patient Name: Autumn [name]       Date: 12/28/2018 DOB: [birth year]  Age: 85 y.o. MRN#: [id number] Attending Physician: Mariel Aloe, MD Primary Care Physician: Lajean Manes, MD Admit Date: 12/18/2018  Reason for Consultation/Follow-up: Establishing goals of care and Psychosocial/spiritual support  Subjective: Autumn [name] is lying in bed grimacing and moaning in quiet distress.  I attempted to speak with her but I was unable to distract her from her distress.  I placed oxygen back in her nose.  I attempted to feed her dinner but she was not interested in eating.  I conferred with Dr. Lonny Prude about her and then called her son [name] [facility]).  I expressed my concern that her infection is still uncontrolled.  She has bilateral multifocal pneumonia on xray 11/11 and she is miserable.  I asked him under what conditions he would consider shifting to comfort measures.  We discussed what comfort measures meant and the possibility of Hospice House.  [name] replied that on Tues and Wednesday his mother had terrible days and he felt she may not make it, but then on Thurs and Friday she was so much better he would not have considered shifting to comfort.  I expressed that she appeared to be having a terrible day today and that I expected she would wax and wane some what.  However, in the overall picture her infection in not controlled and will likely end up taking her life.  [name] understands that his mother does not want to work with PT.  He feels she is giving up and may be ready to focus on comfort.  [name] listened closely and said he would confer with his brother.  [name] is out of town each weekend.  He will be back in town early next week.   Assessment: Clinically  declined.   Patient Profile/HPI:  85 y.o. female  with past medical history of CHF (EF 55-60%), peripheral neuropathy, who had a recent admission for MSSA bacteremia.  This admission was complicated by PE, the discovery of endocarditis and stroke.  She was discharged to [facility] on IV antibiotics.  She returns after approximate 1 week and was admitted on 12/18/2018 with fever and altered mental status.  Despite being on IV antibiotics for endocarditis she is found to have  osteomyelitis, discitis in L1-L2, epidural abscess, and PE.  MR brain reveals new infarcts bilaterally.    PMT had the pleasure of meeting the [name] family last admission.  Autumn [name] is currently a DNR (if she arrests) whose family desires full scope treatment for her if she has not arrested.    Length of Stay: 10  Current Medications: Scheduled Meds:   apixaban  5 mg Oral BID   atorvastatin  20 mg Oral QHS   DULoxetine  30 mg Oral Daily   feeding supplement (ENSURE ENLIVE)  237 mL Oral TID BM   furosemide  40 mg Intravenous BID   LORazepam  0.5 mg Intravenous QHS   mouth rinse  15 mL Mouth Rinse BID   multivitamin with minerals  1 tablet Oral Daily   sodium chloride flush  3 mL Intravenous Q12H   traZODone  50 mg Oral QHS    Continuous Infusions:  sodium chloride 10 mL/hr at 12/26/18 0616   cefTRIAXone (ROCEPHIN)  IV 2 g (12/28/18 0933)   metronidazole 500 mg (12/28/18 1215)   sodium chloride 250 mL (12/28/18 0938)    PRN Meds: sodium chloride, acetaminophen **OR** acetaminophen, albuterol, diclofenac sodium, haloperidol lactate, LORazepam, magic mouthwash, ondansetron **OR** ondansetron (ZOFRAN) IV, oxyCODONE, sodium chloride flush, traMADol  Physical Exam        Well developed elderly female, grimacing and moaning CV tachycardic Resp on 6L no distress Abdomen soft, nt, nd Extremities with 3+ edema/anasarca  Vital Signs: BP 126/66 (BP Location: Left Arm)    Pulse (!) 118    Temp 98.6 F  (37 C) (Axillary)    Resp 17    Ht 5\' 3"  (1.6 m)    Wt 74.1 kg    SpO2 97%    BMI 28.94 kg/m  SpO2: SpO2: 97 % O2 Device: O2 Device: Nasal Cannula O2 Flow Rate: O2 Flow Rate (L/min): 5 L/min  Intake/output summary:   Intake/Output Summary (Last 24 hours) at 12/28/2018 1944 Last data filed at 12/28/2018 1855 Gross per 24 hour  Intake 1020 ml  Output 1900 ml  Net -880 ml   LBM: Last BM Date: 12/24/18 Baseline Weight: Weight: 75.4 kg(per last visit) Most recent weight: Weight: 74.1 kg       Palliative Assessment/Data:  20%    Flowsheet Rows     Most Recent Value  Intake Tab  Referral Department  Hospitalist  Unit at Time of Referral  Med/Surg Unit  Date Notified  12/19/18  Palliative Care Type  Return patient Palliative Care  Reason for referral  Clarify Goals of Care  Date of Admission  12/18/18  Date first seen by Palliative Care  12/20/18  # of days Palliative referral response time  1 Day(s)  # of days IP prior to Palliative referral  1  Clinical Assessment  Psychosocial & Spiritual Assessment  Palliative Care Outcomes      Patient Active Problem List   Diagnosis Date Noted   Acute septic pulmonary embolism without acute cor pulmonale (HCC)    Spinal epidural abscess    Pressure injury of skin 12/19/2018   Sepsis (HCC) 12/18/2018   HCAP (healthcare-associated pneumonia) 12/18/2018   Acute metabolic encephalopathy 12/18/2018   Macrocytic anemia 12/18/2018   Acute respiratory failure with hypoxia (HCC) 12/18/2018   Acute urinary retention    Acute on chronic combined systolic and diastolic CHF (congestive heart failure) (HCC)    Hypothyroidism    Real time reverse transcriptase PCR positive for severe acute respiratory  syndrome coronavirus 2 (SARS-CoV-2) 11/25/2018   Palliative care encounter    MRSA bacteremia    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)    AKI (acute kidney injury) (HCC)    Cerebral embolism  with cerebral infarction 11/20/2018   Endocarditis of mitral valve    MSSA bacteremia 11/19/2018   Septic embolism (HCC)    Pain of upper abdomen    Postural dizziness with near syncope 11/13/2018   Tachypnea    Nausea and vomiting 11/12/2018   Bilateral hydronephrosis 11/12/2018   Elevated troponin 11/12/2018   Lactic acidosis 11/12/2018   Chronic neck and back pain 11/12/2018   Essential hypertension 11/12/2018   Normal coronary arteries 09/19/2013   LBBB (left bundle branch block) 09/19/2013   Congestive dilated cardiomyopathy (HCC) 07/24/2013    Palliative Care Plan    Recommendations/Plan:  Continue current care  On 11/15 increased cymbalta to 60 mg daily for depression and pain.  On 11/15 increased trazodone to 100 mg qhs for sleep and depression.  Continue ativan 0.5 mg qhs  On 11/15 will begin very low dose scheduled ativan during the day to ease acute anxiety.  PMT will continue to follow with you.  Goals of Care and Additional Recommendations:  Limitations on Scope of Treatment: Full Scope Treatment  Code Status:  DNR  Prognosis:   poor prognosis.  If a shift is made to comfort the patient would likely have a prognosis of days.  She is appropriate for Hospice House  Discharge Planning:  To Be Determined  Care plan was discussed with Dr. Caleb Popp and [name])  Thank you for allowing the Palliative Medicine Team to assist in the care of this patient.  Total time spent:  35 min     Greater than 50%  of this time was spent counseling and coordinating care related to the above assessment and plan.  Norvel Richards, PA-C Palliative Medicine  Please contact Palliative MedicineTeam phone at [phone] for questions and concerns between 7 am - 7 pm.   Please see AMION for individual provider pager numbers.

## 2018-12-28 NOTE — Progress Notes (Signed)
   12/28/18 1215  Vitals  Temp 98.2 F (36.8 C)  Temp Source Axillary  BP 116/66  MAP (mmHg) 80  BP Location Left Arm  BP Method Automatic  Patient Position (if appropriate) Lying  ECG Heart Rate (!) 114  Resp (!) 22  Level of Consciousness  Level of Consciousness Alert  Oxygen Therapy  SpO2 100 %  O2 Device Nasal Cannula  O2 Flow Rate (L/min) 6 L/min  MEWS Score  MEWS RR 1  MEWS Pulse 2  MEWS Systolic 0  MEWS LOC 0  MEWS Temp 0  MEWS Score 3  MEWS Score Color Yellow  MEWS Assessment  Is this an acute change? No  MEWS guidelines implemented *See Row Information* Yellow  Pt seen resting in bed. MD notified. No new orders given. Will continue to monitor under yellow MEWS protocol.

## 2018-12-28 NOTE — Progress Notes (Signed)
PROGRESS NOTE    Autumn [name]  VBT:[id number] DOB: November 27, 1932 DOA: 12/18/2018 PCP: Merlene Laughter, MD   Brief Narrative: Autumn [name] is a 85 y.o. female with history of hypertension, chronic systolic CHF, hypothyroidism, left bundle branch block, peripheral neuropathy. Patient presented secondary to altered mental status with concern for sepsis. Found to have multiple issues including PE, discitis/osteomyelitis, pneumonia, CVA.   Assessment & Plan:   Principal Problem:   Sepsis (HCC) Active Problems:   MSSA bacteremia   Acute on chronic combined systolic and diastolic CHF (congestive heart failure) (HCC)   HCAP (healthcare-associated pneumonia)   Acute metabolic encephalopathy   Macrocytic anemia   Acute respiratory failure with hypoxia (HCC)   Pressure injury of skin   Acute septic pulmonary embolism without acute cor pulmonale (HCC)   Spinal epidural abscess  Sepsis Present on admission and likely secondary to HCAP vs discitis/osteomyelitis. Initially improved, but now meeting criteria again in setting of worsening pulmonary infiltrates.  Acute L1-L2 discitis/osteomyelitis Possible epidural abscess/bilateral psoas abscesses Neurosurgery consulted and recommended conservative management with antibiotics. ID recommended nafcillin which was switched to Ceftriaxone on 11/9.  History of MSSA bacteremia Probable mitral valve endocarditis. Repeat blood cultures negative.  Multifocal infiltrates Chest x-ray concerning for multifocal pneumonia. No procalcitonin ordered secondary to underlying systemic infection. Antibiotics switched to Zosyn to for broader coverage. Switched back to Ceftriaxone on 11/12. -Continue Ceftriaxone  Acute ischemic stroke Neurology was consulted. CTA head and neck stable. LDL of 89 and hemoglobin A1C of 5.9%. -Continue Lipitor, Eliquis  Anasarca Patient's albumin is 1.6 which complicates this issue. Will be difficult to treat. -Lasix 40 mg IV BID  -Watch UOP closely  Pulmonary emboli Small emboli of bilateral lungs with mild heart strain on imaging. Started on Eliquis -Continue Eliquis  AKI New with mild increase in BUN and creatinine peak of 3.27. Likely secondary to nafcillin which has been discontinued.  Chronic combined systolic and diastolic heart failure Hypervolemic from IV fluids. EF improved to 55-60%. Does not appear to have heart failure symptoms at this time. -Daily weights -Keep negative fluid balance as vitals allow  Delirium Appears improved.  Hypokalemia hypomagnesemia Replete as needed  Hypoalbuminemia Dietician recommendations   DVT prophylaxis: Eliquis Code Status:   Code Status: DNR Family Communication: None at bedside Disposition Plan: Discharge pending clinical improvement   Consultants:   Neurology  ID  Palliative care  Neurosurgery  Procedures:   Echo  1. Left ventricular ejection fraction, by visual estimation, is 55 to 60%. The left ventricle has normal function. There is mildly increased left ventricular hypertrophy. 2. Global right ventricle has normal systolic function.The right ventricular size is normal. No increase in right ventricular wall thickness. 3. Left atrium is mildly dialted. 4. The mitral valve is abnormal. Mild mitral valve regurgitation. There is moderate calcification of the mitral valve leaflets. I do not see an obvious vegetation but image qualite is poor. 5. Right atrial size was normal. 6. The aortic valve was not well visualized. Aortic valve regurgitation is not visualized. 7. The pulmonic valve was not well visualized. Pulmonic valve regurgitation is not visualized. 8. Abnormal septal motion consistent with left bundle branch block. 9. The inferior vena cava is normal in size with greater than 50% respiratory variability, suggesting right atrial pressure of 3 mmHg. 10. Limited echo with poor images. 11. Moderate calcification of the mitral  valve leaflet(s). 12. The tricuspid valve is normal in structure. Tricuspid valve regurgitation is trivial.  Antimicrobials:  Vancomycin  Cefepime  Nafcillin  Ceftriaxone  Zosyn    Subjective: Back pain.  Objective: Vitals:   12/28/18 0800 12/28/18 1204 12/28/18 1215 12/28/18 1251  BP:  (!) 104/52 116/66   Pulse:    (!) 111  Resp:  (!) 22 (!) 22 18  Temp: 98.4 F (36.9 C) 98.2 F (36.8 C) 98.2 F (36.8 C)   TempSrc: Skin Skin Axillary   SpO2:  100% 100%   Weight:      Height:        Intake/Output Summary (Last 24 hours) at 12/28/2018 1337 Last data filed at 12/28/2018 1100 Gross per 24 hour  Intake 862.8 ml  Output 950 ml  Net -87.2 ml   Filed Weights   12/24/18 0409 12/25/18 0330 12/26/18 0356  Weight: 72 kg 75.6 kg 74.1 kg    Examination:  General exam: Appears calm and comfortable Respiratory system: Clear to auscultation. Respiratory effort normal. Cardiovascular system: S1 & S2 heard, RRR. No murmurs, rubs, gallops or clicks. Gastrointestinal system: Abdomen is nondistended, soft and nontender. No organomegaly or masses felt. Normal bowel sounds heard. Central nervous system: Alert and oriented. No focal neurological deficits. Extremities: Upper/lower extremity 2+ edema. No calf tenderness Skin: No cyanosis. No rashes Psychiatry: Judgement and insight appear normal. Mood & affect appropriate.     Data Reviewed: I have personally reviewed following labs and imaging studies  CBC: Recent Labs  Lab 12/23/18 0307 12/24/18 0252 12/25/18 1148 12/26/18 0317 12/27/18 1034 12/28/18 0256  WBC 16.2* 17.0* 16.7* 13.7* 18.2* 11.4*  NEUTROABS 8.2* 13.0* 10.8*  --   --   --   HGB 10.1* 11.1* 11.1* 9.5* 10.0* 12.0  HCT 31.0* 33.5* 33.5* 29.8* 31.1* 38.5  MCV 98.1 96.5 98.5 99.0 102.0* 102.4*  PLT 305 313 PLATELET CLUMPS NOTED ON SMEAR, COUNT APPEARS ADEQUATE 304 322 264   Basic Metabolic Panel: Recent Labs  Lab 12/24/18 0252 12/25/18 0236 12/26/18  0317 12/27/18 0234 12/27/18 1034 12/28/18 0256  NA 134* 134* 136  --  141 139  K 4.5 4.1 4.7  --  4.4 4.6  CL 102 106 109  --  106 109  CO2 19* 18* 15*  --  15* 20*  GLUCOSE 117* 143* 127*  --  120* 114*  BUN 34* 37* 39*  --  36* 33*  CREATININE 3.21* 3.01* 2.87*  --  2.51* 2.21*  CALCIUM 7.9* 7.8* 8.1*  --  8.5* 8.2*  MG 2.1 1.9 1.9 2.0  --  1.8   GFR: Estimated Creatinine Clearance: 18.3 mL/min (A) (by C-G formula based on SCr of 2.21 mg/dL (H)). Liver Function Tests: No results for input(s): AST, ALT, ALKPHOS, BILITOT, PROT, ALBUMIN in the last 168 hours. No results for input(s): LIPASE, AMYLASE in the last 168 hours. No results for input(s): AMMONIA in the last 168 hours. Coagulation Profile: No results for input(s): INR, PROTIME in the last 168 hours. Cardiac Enzymes: No results for input(s): CKTOTAL, CKMB, CKMBINDEX, TROPONINI in the last 168 hours. BNP (last 3 results) No results for input(s): PROBNP in the last 8760 hours. HbA1C: No results for input(s): HGBA1C in the last 72 hours. CBG: No results for input(s): GLUCAP in the last 168 hours. Lipid Profile: No results for input(s): CHOL, HDL, LDLCALC, TRIG, CHOLHDL, LDLDIRECT in the last 72 hours. Thyroid Function Tests: No results for input(s): TSH, T4TOTAL, FREET4, T3FREE, THYROIDAB in the last 72 hours. Anemia Panel: No results for input(s): VITAMINB12, FOLATE, FERRITIN, TIBC, IRON, RETICCTPCT in the last 72  hours. Sepsis Labs: No results for input(s): PROCALCITON, LATICACIDVEN in the last 168 hours.  Recent Results (from the past 240 hour(s))  Urine culture     Status: Abnormal   Collection Time: 12/18/18  3:26 PM   Specimen: In/Out Cath Urine  Result Value Ref Range Status   Specimen Description IN/OUT CATH URINE  Final   Special Requests   Final    NONE Performed at [facility], [address], [place], [place] [address]    Culture 20,000 COLONIES/mL YEAST (A)  Final   Report Status 12/19/2018  FINAL  Final  MRSA PCR Screening     Status: None   Collection Time: 12/20/18 12:50 AM   Specimen: Nasal Mucosa; Nasopharyngeal  Result Value Ref Range Status   MRSA by PCR NEGATIVE NEGATIVE Final    Comment:        The GeneXpert MRSA Assay (FDA approved for NASAL specimens only), is one component of a comprehensive MRSA colonization surveillance program. It is not intended to diagnose MRSA infection nor to guide or monitor treatment for MRSA infections. Performed at [facility], [place] [address], [place], [place] [address]          Radiology Studies: No results found.      Scheduled Meds: . apixaban  5 mg Oral BID  . atorvastatin  20 mg Oral QHS  . DULoxetine  30 mg Oral Daily  . feeding supplement (ENSURE ENLIVE)  237 mL Oral TID BM  . furosemide  40 mg Intravenous BID  . LORazepam  0.5 mg Intravenous QHS  . mouth rinse  15 mL Mouth Rinse BID  . multivitamin with minerals  1 tablet Oral Daily  . sodium chloride flush  3 mL Intravenous Q12H  . traZODone  50 mg Oral QHS   Continuous Infusions: . sodium chloride 10 mL/hr at 12/26/18 0616  . cefTRIAXone (ROCEPHIN)  IV 2 g (12/28/18 0933)  . metronidazole 500 mg (12/28/18 1215)  . sodium chloride 250 mL (12/28/18 0938)     LOS: 10 days     Cordelia Poche, MD [facility] 12/28/2018, 1:37 PM  If 7PM-7AM, please contact night-coverage [email or web address]

## 2018-12-28 NOTE — Progress Notes (Signed)
Reassessment of pt during Q2 turn, noticed peripheral cyanosis in some fingers on both hands. Cap. refill greater than 3 seconds in all fingers. Bilateral extremities cold to the touch but pt. Reports sensation in both. Bilateral grips present. MD notified. No new orders at this time. Will continue to monitor.

## 2018-12-29 LAB — BASIC METABOLIC PANEL
Anion gap: 12 (ref 5–15)
BUN: 35 mg/dL — ABNORMAL HIGH (ref 8–23)
CO2: 17 mmol/L — ABNORMAL LOW (ref 22–32)
Calcium: 8.5 mg/dL — ABNORMAL LOW (ref 8.9–10.3)
Chloride: 113 mmol/L — ABNORMAL HIGH (ref 98–111)
Creatinine, Ser: 2.25 mg/dL — ABNORMAL HIGH (ref 0.44–1.00)
GFR calc Af Amer: 22 mL/min — ABNORMAL LOW (ref >60)
GFR calc non Af Amer: 19 mL/min — ABNORMAL LOW (ref >60)
Glucose, Bld: 119 mg/dL — ABNORMAL HIGH (ref 70–99)
Potassium: 4.6 mmol/L (ref 3.5–5.1)
Sodium: 142 mmol/L (ref 135–145)

## 2018-12-29 LAB — CBC
HCT: 31.8 % — ABNORMAL LOW (ref 36.0–46.0)
Hemoglobin: 10 g/dL — ABNORMAL LOW (ref 12.0–15.0)
MCH: 32.1 pg (ref 26.0–34.0)
MCHC: 31.4 g/dL (ref 30.0–36.0)
MCV: 101.9 fL — ABNORMAL HIGH (ref 80.0–100.0)
Platelets: 342 10*3/uL (ref 150–400)
RBC: 3.12 MIL/uL — ABNORMAL LOW (ref 3.87–5.11)
RDW: 18.8 % — ABNORMAL HIGH (ref 11.5–15.5)
WBC: 9.9 10*3/uL (ref 4.0–10.5)
nRBC: 0.4 % — ABNORMAL HIGH (ref 0.0–0.2)

## 2018-12-29 LAB — MAGNESIUM: Magnesium: 1.6 mg/dL — ABNORMAL LOW (ref 1.7–2.4)

## 2018-12-29 MED ORDER — DULOXETINE HCL 60 MG PO CPEP: 60.0000 mg | ORAL_CAPSULE | Freq: Every day | ORAL | Status: DC

## 2018-12-29 MED ORDER — TRAZODONE HCL 50 MG PO TABS
100.0000 mg | ORAL_TABLET | Freq: Every day | ORAL | Status: DC
  Filled 2018-12-29: qty 2

## 2018-12-29 MED ORDER — LORAZEPAM 2 MG/ML IJ SOLN
0.2500 mg | Freq: Two times a day (BID) | INTRAMUSCULAR | Status: DC
  Administered 2018-12-29 – 2018-12-31 (×6): 0.25 mg via INTRAVENOUS
  Filled 2018-12-29 (×6): qty 1

## 2018-12-29 MED ORDER — HYDROMORPHONE HCL 1 MG/ML IJ SOLN
0.5000 mg | INTRAMUSCULAR | Status: DC | PRN
  Administered 2018-12-29 – 2018-12-31 (×10): 0.5 mg via INTRAVENOUS
  Filled 2018-12-29 (×11): qty 0.5

## 2018-12-29 MED ORDER — HYDROMORPHONE HCL 1 MG/ML IJ SOLN
0.5000 mg | INTRAMUSCULAR | Status: DC | PRN
  Administered 2018-12-29: 0.5 mg via INTRAVENOUS
  Filled 2018-12-29: qty 0.5

## 2018-12-29 NOTE — Progress Notes (Signed)
NT reported to RN that pt's foley catheter was leaking. When pt voided it was evident that it was surpassing the catheter and wetting the bed. RN assessed site. Bladder scan completed 800 ml to 1L seen based of position of bladder scan. When RN went to reassess site inflated catheter had was out of pt. RN performed peri-care and inserted new foley catheter. Site continued to leak when balloon inflated with 10 ml. RN inflated with a total of 20 ml. Sit no longer leaked. 800 ml of urine came out immediately. RN will continue to assess pt's foley catheter.

## 2018-12-29 NOTE — Progress Notes (Signed)
    [facility] for Infectious Disease   Reason for visit: Follow up on MSSA bacteremia  Interval History: WBC now back to normal at 9.9.  Remains afebrile.  No acute events.  Patient does not respond to questions, just moans in apparent pain/discomfort.     Physical Exam: Constitutional:  Vitals:   12/29/18 0331 12/29/18 0805  BP: (!) 132/93 126/73  Pulse: (!) 116 (!) 107  Resp: 20 16  Temp:  97.7 F (36.5 C)  SpO2: 94% 100%   patient appears in distress with pain Respiratory: normal respiratory rate; CTA B Cardiovascular: tachy RR GI: soft, nt, nd MS: no edema  Review of Systems: Patient unable to answer   Lab Results  Component Value Date   WBC 9.9 12/29/2018   HGB 10.0 (L) 12/29/2018   HCT 31.8 (L) 12/29/2018   MCV 101.9 (H) 12/29/2018   PLT 342 12/29/2018    Lab Results  Component Value Date   CREATININE 2.25 (H) 12/29/2018   BUN 35 (H) 12/29/2018   NA 142 12/29/2018   K 4.6 12/29/2018   CL 113 (H) 12/29/2018   CO2 17 (L) 12/29/2018    Lab Results  Component Value Date   ALT 28 12/20/2018   AST 33 12/20/2018   ALKPHOS 113 12/20/2018     Microbiology: Recent Results (from the past 240 hour(s))  MRSA PCR Screening     Status: None   Collection Time: 12/20/18 12:50 AM   Specimen: Nasal Mucosa; Nasopharyngeal  Result Value Ref Range Status   MRSA by PCR NEGATIVE NEGATIVE Final    Comment:        The GeneXpert MRSA Assay (FDA approved for NASAL specimens only), is one component of a comprehensive MRSA colonization surveillance program. It is not intended to diagnose MRSA infection nor to guide or monitor treatment for MRSA infections. Performed at [facility], [place] [address] [place], [place] [address]     Impression/Plan:  1. Discitis/osteomyelitis with epidural abscess - on high dose ceftriaxone through 12/15  2.  AKI - creat stable but remains up at 2.25.    3.  Mitral valve vegetation - same MSSA and on ceftriaxone as above.     4.  Embolic stroke - remains weak and not participating with PT.  She overall seems to be declining.    5.  Pulmonary infiltrates - bilateral and improved breathing now and wBC wnl.  Seems to have been aspiration and improved with Flagyl.  I suspect she will be at continued risk.  Will continue Flagyl through tomorrow and then stop.    6.  Palliative care - recent conversation noted and she is continuing to decline in her function, not participate and overall in discomfort.  Poor prognosis and family leaning toward more comfort.   At this point, her trajectory is declining and no further acute infectious needs so will sign off.  If this changes and she goes out on IV antibiotics, we can reengage. Dr. Baxter Flattery on tomorrow if needed.

## 2018-12-29 NOTE — Progress Notes (Signed)
PROGRESS NOTE    Autumn [name]  FIE:[id number] DOB: 09-16-1932 DOA: 12/18/2018 PCP: Autumn Laughter, MD   Brief Narrative: Autumn [name] is a 85 y.o. female with history of hypertension, chronic systolic CHF, hypothyroidism, left bundle branch block, peripheral neuropathy. Patient presented secondary to altered mental status with concern for sepsis. Found to have multiple issues including PE, discitis/osteomyelitis, pneumonia, CVA.   Assessment & Plan:   Principal Problem:   Sepsis (HCC) Active Problems:   MSSA bacteremia   Acute on chronic combined systolic and diastolic CHF (congestive heart failure) (HCC)   HCAP (healthcare-associated pneumonia)   Acute metabolic encephalopathy   Macrocytic anemia   Acute respiratory failure with hypoxia (HCC)   Pressure injury of skin   Acute septic pulmonary embolism without acute cor pulmonale (HCC)   Spinal epidural abscess   Acute bacterial endocarditis  Sepsis Present on admission and likely secondary to HCAP vs discitis/osteomyelitis. Initially improved, but now meeting criteria again in setting of worsening pulmonary infiltrates.  Acute L1-L2 discitis/osteomyelitis Possible epidural abscess/bilateral psoas abscesses Neurosurgery consulted and recommended conservative management with antibiotics. ID recommended nafcillin which was switched to Ceftriaxone on 11/9. -Dilaudid IV prn for pain  History of MSSA bacteremia Probable mitral valve endocarditis. Repeat blood cultures negative.  Multifocal infiltrates Chest x-ray concerning for multifocal pneumonia. No procalcitonin ordered secondary to underlying systemic infection. Antibiotics switched to Zosyn to for broader coverage. Switched back to Ceftriaxone on 11/12. -Continue Ceftriaxone  Acute ischemic stroke Neurology was consulted. CTA head and neck stable. LDL of 89 and hemoglobin A1C of 5.9%. -Continue Lipitor, Eliquis  Anasarca Patient's albumin is 1.6 which complicates  this issue. Will be difficult to treat. -Lasix 40 mg IV BID -Watch UOP closely  Pulmonary emboli Small emboli of bilateral lungs with mild heart strain on imaging. Started on Eliquis -Continue Eliquis  AKI New with mild increase in BUN and creatinine peak of 3.27. Likely secondary to nafcillin which has been discontinued.  Chronic combined systolic and diastolic heart failure Hypervolemic from IV fluids. EF improved to 55-60%. Does not appear to have heart failure symptoms at this time. -Daily weights -Keep negative fluid balance as vitals allow  Delirium Appears improved.  Hypokalemia hypomagnesemia Replete as needed  Hypoalbuminemia Dietician recommendations  Goals of care Concern patient is not making meaningful gains. Per discussion with son, she had a very active life prior to this illness and expressed desire not to be kept alive in a dependent state as her husband and mother were. Today, Autumn [name] is looking worse. Would recommend transition to comfort care at this time. HCPOA will discuss with his brother and mother (if able) and possibly transition to full comfort care.   DVT prophylaxis: Eliquis Code Status:   Code Status: DNR Family Communication: None at bedside Disposition Plan: Discharge pending clinical improvement   Consultants:   Neurology  ID  Palliative care  Neurosurgery  Procedures:   Echo  1. Left ventricular ejection fraction, by visual estimation, is 55 to 60%. The left ventricle has normal function. There is mildly increased left ventricular hypertrophy. 2. Global right ventricle has normal systolic function.The right ventricular size is normal. No increase in right ventricular wall thickness. 3. Left atrium is mildly dialted. 4. The mitral valve is abnormal. Mild mitral valve regurgitation. There is moderate calcification of the mitral valve leaflets. I do not see an obvious vegetation but image qualite is poor. 5. Right atrial  size was normal. 6. The aortic valve was not well  visualized. Aortic valve regurgitation is not visualized. 7. The pulmonic valve was not well visualized. Pulmonic valve regurgitation is not visualized. 8. Abnormal septal motion consistent with left bundle branch block. 9. The inferior vena cava is normal in size with greater than 50% respiratory variability, suggesting right atrial pressure of 3 mmHg. 10. Limited echo with poor images. 11. Moderate calcification of the mitral valve leaflet(s). 12. The tricuspid valve is normal in structure. Tricuspid valve regurgitation is trivial.  Antimicrobials:  Vancomycin  Cefepime  Nafcillin  Ceftriaxone  Zosyn    Subjective: Needs to use the bathroom.  Objective: Vitals:   12/29/18 0131 12/29/18 0331 12/29/18 0805 12/29/18 1205  BP: 136/74 (!) 132/93 126/73 (!) 104/58  Pulse: (!) 120 (!) 116 (!) 107 95  Resp: (!) 25 20 16 10   Temp:   97.7 F (36.5 C) 97.9 F (36.6 C)  TempSrc:   Axillary Axillary  SpO2: 93% 94% 100% 99%  Weight:      Height:        Intake/Output Summary (Last 24 hours) at 12/29/2018 1325 Last data filed at 12/29/2018 0900 Gross per 24 hour  Intake 1260.12 ml  Output 1850 ml  Net -589.88 ml   Filed Weights   12/24/18 0409 12/25/18 0330 12/26/18 0356  Weight: 72 kg 75.6 kg 74.1 kg    Examination:  General exam: Appears agitated and uncomfortable Respiratory system: Clear to auscultation. Respiratory effort normal. Cardiovascular system: S1 & S2 heard, RRR. No murmurs, rubs, gallops or clicks. Gastrointestinal system: Abdomen is nondistended, soft and nontender. No organomegaly or masses felt. Normal bowel sounds heard. Central nervous system: Alert Extremities: Upper/lower extremity edema. No calf tenderness Skin: No cyanosis. No rashes    Data Reviewed: I have personally reviewed following labs and imaging studies  CBC: Recent Labs  Lab 12/23/18 0307 12/24/18 0252 12/25/18 1148  12/26/18 0317 12/27/18 1034 12/28/18 0256 12/29/18 0256  WBC 16.2* 17.0* 16.7* 13.7* 18.2* 11.4* 9.9  NEUTROABS 8.2* 13.0* 10.8*  --   --   --   --   HGB 10.1* 11.1* 11.1* 9.5* 10.0* 12.0 10.0*  HCT 31.0* 33.5* 33.5* 29.8* 31.1* 38.5 31.8*  MCV 98.1 96.5 98.5 99.0 102.0* 102.4* 101.9*  PLT 305 313 PLATELET CLUMPS NOTED ON SMEAR, COUNT APPEARS ADEQUATE 304 322 264 342   Basic Metabolic Panel: Recent Labs  Lab 12/25/18 0236 12/26/18 0317 12/27/18 0234 12/27/18 1034 12/28/18 0256 12/29/18 0256  NA 134* 136  --  141 139 142  K 4.1 4.7  --  4.4 4.6 4.6  CL 106 109  --  106 109 113*  CO2 18* 15*  --  15* 20* 17*  GLUCOSE 143* 127*  --  120* 114* 119*  BUN 37* 39*  --  36* 33* 35*  CREATININE 3.01* 2.87*  --  2.51* 2.21* 2.25*  CALCIUM 7.8* 8.1*  --  8.5* 8.2* 8.5*  MG 1.9 1.9 2.0  --  1.8 1.6*   GFR: Estimated Creatinine Clearance: 18 mL/min (A) (by C-G formula based on SCr of 2.25 mg/dL (H)). Liver Function Tests: No results for input(s): AST, ALT, ALKPHOS, BILITOT, PROT, ALBUMIN in the last 168 hours. No results for input(s): LIPASE, AMYLASE in the last 168 hours. No results for input(s): AMMONIA in the last 168 hours. Coagulation Profile: No results for input(s): INR, PROTIME in the last 168 hours. Cardiac Enzymes: No results for input(s): CKTOTAL, CKMB, CKMBINDEX, TROPONINI in the last 168 hours. BNP (last 3 results) No results for  input(s): PROBNP in the last 8760 hours. HbA1C: No results for input(s): HGBA1C in the last 72 hours. CBG: No results for input(s): GLUCAP in the last 168 hours. Lipid Profile: No results for input(s): CHOL, HDL, LDLCALC, TRIG, CHOLHDL, LDLDIRECT in the last 72 hours. Thyroid Function Tests: No results for input(s): TSH, T4TOTAL, FREET4, T3FREE, THYROIDAB in the last 72 hours. Anemia Panel: No results for input(s): VITAMINB12, FOLATE, FERRITIN, TIBC, IRON, RETICCTPCT in the last 72 hours. Sepsis Labs: No results for input(s):  PROCALCITON, LATICACIDVEN in the last 168 hours.  Recent Results (from the past 240 hour(s))  MRSA PCR Screening     Status: None   Collection Time: 12/20/18 12:50 AM   Specimen: Nasal Mucosa; Nasopharyngeal  Result Value Ref Range Status   MRSA by PCR NEGATIVE NEGATIVE Final    Comment:        The GeneXpert MRSA Assay (FDA approved for NASAL specimens only), is one component of a comprehensive MRSA colonization surveillance program. It is not intended to diagnose MRSA infection nor to guide or monitor treatment for MRSA infections. Performed at [facility], [place] [address], [place], [place] [address]          Radiology Studies: No results found.      Scheduled Meds: . apixaban  5 mg Oral BID  . atorvastatin  20 mg Oral QHS  . DULoxetine  60 mg Oral Daily  . feeding supplement (ENSURE ENLIVE)  237 mL Oral TID BM  . furosemide  40 mg Intravenous BID  . LORazepam  0.25 mg Intravenous BID BM  . LORazepam  0.5 mg Intravenous QHS  . mouth rinse  15 mL Mouth Rinse BID  . sodium chloride flush  3 mL Intravenous Q12H  . traZODone  100 mg Oral QHS   Continuous Infusions: . sodium chloride 10 mL/hr at 12/26/18 0616  . cefTRIAXone (ROCEPHIN)  IV 2 g (12/29/18 0943)  . metronidazole 500 mg (12/29/18 1315)     LOS: 11 days     Cordelia Poche, MD [facility] 12/29/2018, 1:25 PM  If 7PM-7AM, please contact night-coverage [email or web address]

## 2018-12-29 NOTE — Progress Notes (Signed)
RN received phone call from [name] [facility]) - family has decided they want pt to be strictly comfort care. RN notified Lonny Prude MD.

## 2018-12-29 NOTE — Progress Notes (Addendum)
Per day shift nurse patients was made comfort care. After reviewing the orders it seems that most everything was switched to comfort care except the level of care was still "Med/Surg". Contacted night MD to see if we could get LOC changed to "Comfort". Received order from MD. Thank you

## 2018-12-29 NOTE — Progress Notes (Signed)
Daily Progress Note   Patient Name: Autumn [name]       Date: 12/29/2018 DOB: [birth year]  Age: 85 y.o. MRN#: [id number] Attending Physician: Narda Bonds, MD Primary Care Physician: Merlene Laughter, MD Admit Date: 12/18/2018  Reason for Consultation/Follow-up: Establishing goals of care and Psychosocial/spiritual support  Subjective: Received call from [facility].  Per brother [name] patient was having a very bad day.  Resp rate up, moaning in pain, much improved after IV pain medication given.  Sons understand she is not eating.  They ask about a temporary feeding tube.  I explained that it would make her more uncomfortable, and worsen her fluid overload.  [name] feels the antibiotics were not being administered effectively prior to this admission (she was on Rocephin via PICC at discharge from last admission for endocarditis and MSSA bacteremia).  Patient pulled her PICC out at [facility] and it did not bleed.  [name] feels since it did not bleed the antibiotics were not being administered into her vessel properly.  Per [name].  [name] he had to take his cat to the vet to put it down this week.  He feels that shifting to comfort/hospice would be "putting his mom down".  I explained why this is not the case and left a Hard Choices book for him to review.   Assessment: Patient clearly not improving.  Diffuse anasarca.  High level of anxiety (moaning/grimacing unless she's asleep).  On 4+ liters of oxygen.  Not eating.  Patient Profile/HPI:  85 y.o. female  with past medical history of CHF (EF 55-60%), peripheral neuropathy, who had a recent admission for MSSA bacteremia.  This admission was complicated by PE, the discovery of endocarditis and stroke.  She was  discharged to [facility] on IV antibiotics.  She returns after approximate 1 week and was admitted on 12/18/2018 with fever and altered mental status.  Despite being on IV antibiotics for endocarditis she is found to have osteomyelitis, discitis in L1-L2, epidural abscess, and PE.  MR brain reveals new infarcts bilaterally.    PMT had the pleasure of meeting the [name] family last admission.  Mrs. General is currently a DNR (if she arrests) whose family desires full scope treatment for her if she has not arrested.  Length of Stay: 11  Current Medications: Scheduled Meds:  . apixaban  5 mg Oral BID  . atorvastatin  20 mg Oral QHS  . DULoxetine  60 mg Oral Daily  . feeding supplement (ENSURE ENLIVE)  237 mL Oral TID BM  . furosemide  40 mg Intravenous BID  . LORazepam  0.25 mg Intravenous BID BM  . LORazepam  0.5 mg Intravenous QHS  . mouth rinse  15 mL Mouth Rinse BID  . sodium chloride flush  3 mL Intravenous Q12H  . traZODone  100 mg Oral QHS    Continuous Infusions: . sodium chloride 10 mL/hr at 12/26/18 0616  . cefTRIAXone (ROCEPHIN)  IV 2 g (12/29/18 0943)  . metronidazole 500 mg (12/29/18 1315)    PRN Meds: sodium chloride, acetaminophen **OR** acetaminophen, albuterol, diclofenac sodium, haloperidol lactate, HYDROmorphone (DILAUDID) injection, magic mouthwash, ondansetron **OR** ondansetron (ZOFRAN) IV, sodium chloride flush, traMADol  Physical Exam        Well developed elderly female, asleep.  I did not wake.  Oxygen askew CV tachy resp no distress Abdomen soft, NT, ND UE with 2+ edema / anasarca.  Vital Signs: BP (!) 104/58 (BP Location: Left Arm)   Pulse 95   Temp 98 F (36.7 C) (Axillary)   Resp 10   Ht 5\' 3"  (1.6 m)   Wt 74.1 kg   SpO2 99%   BMI 28.94 kg/m  SpO2: SpO2: 99 % O2 Device: O2 Device: Nasal Cannula O2 Flow Rate: O2 Flow Rate (L/min): 2 L/min  Intake/output summary:   Intake/Output Summary (Last 24 hours) at 12/29/2018 1637 Last data filed  at 12/29/2018 0900 Gross per 24 hour  Intake 1180.12 ml  Output 1850 ml  Net -669.88 ml   LBM: Last BM Date: 12/24/18 Baseline Weight: Weight: 75.4 kg(per last visit) Most recent weight: Weight: 74.1 kg       Palliative Assessment/Data: 20%     Flowsheet Rows     Most Recent Value  Intake Tab  Referral Department  Hospitalist  Unit at Time of Referral  Med/Surg Unit  Date Notified  12/19/18  Palliative Care Type  Return patient Palliative Care  Reason for referral  Clarify Goals of Care  Date of Admission  12/18/18  Date first seen by Palliative Care  12/20/18  # of days Palliative referral response time  1 Day(s)  # of days IP prior to Palliative referral  1  Clinical Assessment  Psychosocial & Spiritual Assessment  Palliative Care Outcomes      Patient Active Problem List   Diagnosis Date Noted  . Acute bacterial endocarditis   . Acute septic pulmonary embolism without acute cor pulmonale (HCC)   . Spinal epidural abscess   . Pressure injury of skin 12/19/2018  . Sepsis ([place]) 12/18/2018  . HCAP (healthcare-associated pneumonia) 12/18/2018  . Acute metabolic encephalopathy 18/29/9371  . Macrocytic anemia 12/18/2018  . Acute respiratory failure with hypoxia ([place]) 12/18/2018  . Acute urinary retention   . Acute on chronic combined systolic and diastolic CHF (congestive heart failure) ([place])   . Hypothyroidism   . Real time reverse transcriptase PCR positive for severe acute respiratory syndrome coronavirus 2 (SARS-CoV-2) 11/25/2018  . Palliative care encounter   . MRSA bacteremia   . 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 AKI (acute kidney injury) ([place])   . Cerebral embolism with cerebral infarction 11/20/2018  . Endocarditis of mitral valve   . MSSA bacteremia 11/19/2018  . Septic  embolism (HCC)   . Pain of upper abdomen   . Postural dizziness with near syncope 11/13/2018  . Tachypnea   . Nausea and vomiting 11/12/2018   . Bilateral hydronephrosis 11/12/2018  . Elevated troponin 11/12/2018  . Lactic acidosis 11/12/2018  . Chronic neck and back pain 11/12/2018  . Essential hypertension 11/12/2018  . Normal coronary arteries 09/19/2013  . LBBB (left bundle branch block) 09/19/2013  . Congestive dilated cardiomyopathy (HCC) 07/24/2013    Palliative Care Plan    Recommendations/Plan:  Continue current care  PMT will continue to follow with you intermittently to support patient, family and medical team.  Goals of Care and Additional Recommendations:  Limitations on Scope of Treatment: Full Scope Treatment  Code Status:  DNR  Prognosis:   Unable to determine.  If she shifts to comfort her prognosis would be days to perhaps 1 week.   Discharge Planning:  To Be Determined  Care plan was discussed with Dr. Caleb Popp, son [name], RN.  Thank you for allowing the Palliative Medicine Team to assist in the care of this patient.  Total time spent:  60 min.     Greater than 50%  of this time was spent counseling and coordinating care related to the above assessment and plan.  Norvel Richards, PA-C Palliative Medicine  Please contact Palliative MedicineTeam phone at [phone] for questions and concerns between 7 am - 7 pm.   Please see AMION for individual provider pager numbers.

## 2018-12-30 DIAGNOSIS — Z7189 Other specified counseling: Secondary | ICD-10-CM

## 2018-12-30 DIAGNOSIS — Z515 Encounter for palliative care: Secondary | ICD-10-CM

## 2018-12-30 MED ORDER — GLYCOPYRROLATE 0.2 MG/ML IJ SOLN
0.2000 mg | INTRAMUSCULAR | Status: DC | PRN
  Administered 2018-12-30: 0.2 mg via INTRAVENOUS
  Filled 2018-12-30: qty 1

## 2018-12-30 MED ORDER — ATROPINE SULFATE 1 % OP SOLN
2.0000 [drp] | Freq: Four times a day (QID) | OPHTHALMIC | Status: DC | PRN
  Filled 2018-12-30: qty 2

## 2018-12-30 MED ORDER — GLYCOPYRROLATE 0.2 MG/ML IJ SOLN: 0.1000 mg | Freq: Two times a day (BID) | INTRAMUSCULAR | Status: DC | PRN

## 2018-12-30 MED ORDER — GLYCOPYRROLATE 0.2 MG/ML IJ SOLN
0.4000 mg | Freq: Two times a day (BID) | INTRAMUSCULAR | Status: DC
  Administered 2018-12-30 – 2018-12-31 (×3): 0.4 mg via INTRAVENOUS
  Filled 2018-12-30 (×3): qty 2

## 2018-12-30 NOTE — Progress Notes (Signed)
Pt is having periods of strangling coughing. Unable to suction. Secretions are staying in throat. Rhonchi sounds are heard in upper lungs. No crackles. Have notified MD of findings and requesting scopolamine. Will continue to monitor.

## 2018-12-30 NOTE — Progress Notes (Addendum)
[place]:  [place]  Referral received from Alice at [facility] She reports that [facility] have a bed to offer today and the pt's son was eager to get pt moved out of hospital. Spoke to the son and he is in agreement with hospice services. Spoke to our Market researcher and she does approve the pt for GIP level of care. The pt would like to proceed with transfer today. Urban Gibson will reach out to the MD and get discharge orders and arrange ambulance for transport. The pt's son will go today to sign paperwork. Webb Silversmith RN [phone]  Received notification from [facility] that pt would be discharged tomorrow. She will reach out to the pt's son and let him know. Webb Silversmith RN

## 2018-12-30 NOTE — Discharge Summary (Addendum)
Physician Discharge Summary  JOSEPH JOHNS ZLD:[id number] DOB: [birth year] DOA: 12/18/2018  PCP: Merlene Laughter, MD  Admit date: 12/18/2018 Discharge date: 12/31/2018  Admitted From: SNF Disposition: Hospice   Discharge Condition: Hospice CODE STATUS: DNR Diet recommendation: Comfort   Brief/Interim Summary:  Admission HPI written by Clydie Braun, MD   HPI: Autumn [name] is a 85 y.o. female with medical history significant of hypertension, systolic CHF, hypothyroidism, left bundle branch block, and peripheral neuropathy presenting with complaints of fever and altered mental status. History from the patient is limited due to her current state. She was hospitalized from 9/29-10/13 after initially presenting with complaints of nausea and vomiting.  However, had been found to have acute ischemic left cerebellar CVA, MSSA bacteremia with signs of posterior mitral valve vegetation consistent with endocarditis on TEE from 10/7, as well as acute on chronic CHF with EF decreased down to 25%.  Patient had declined aggressive work-up by cardiology and gastroenterology.  Started on Rocephin by ID to complete treatment on 12/31/2018 for endocarditis.  She was discharged to rehab facility where she been with indwelling Foley in place.  Since the stroke the patient had been basically bedbound.  Facility staff noted decreasing mental status and noted swelling in her right arm PICC line.  They obtain chest x-ray did not ultrasound of the arms which showed no signs of a DVT.  Today patient noted to have fever up to 102.4 F and O2 saturations in the 80s.    Hospital course:  Sepsis Present on admission and likely secondary to HCAP vs discitis/osteomyelitis. Initially improved, but now meeting criteria again in setting of worsening pulmonary infiltrates.  Acute L1-L2 discitis/osteomyelitis Possible epidural abscess/bilateral psoas abscesses Neurosurgery consulted and recommended conservative  management with antibiotics. ID recommended nafcillin which was switched to Ceftriaxone on 11/9. Comfort care.  History of MSSA bacteremia Probable mitral valve endocarditis. Repeat blood cultures negative.  Multifocal infiltrates Chest x-ray concerning for multifocal pneumonia. No procalcitonin ordered secondary to underlying systemic infection. Antibiotics switched to Zosyn to for broader coverage. Switched back to Ceftriaxone on 11/12. Transitioned to comfort care.  Acute ischemic stroke Neurology was consulted. CTA head and neck stable. LDL of 89 and hemoglobin A1C of 5.9%. Now comfort care so will discontinue Lipitor and Eliquis  Anasarca Patient's albumin is 1.6 which complicates this issue. Will be difficult to treat. Now comfort care. Discontinued Lasix.  Pulmonary emboli Small emboli of bilateral lungs with mild heart strain on imaging. Started on Eliquis which has now been discontinued since patient is not taking PO adequately and is comfort care.  AKI New with mild increase in BUN and creatinine peak of 3.27. Likely secondary to nafcillin which has been discontinued.  Chronic combined systolic and diastolic heart failure Hypervolemic from IV fluids. EF improved to 55-60%. Does not appear to have heart failure symptoms at this time. Comfort care.  Delirium Appears improved. Ativan prn.  Hypokalemia hypomagnesemia Replete as needed  Hypoalbuminemia Dietician recommendations  Discharge Diagnoses:  Principal Problem:   Sepsis (HCC) Active Problems:   MSSA bacteremia   Acute on chronic combined systolic and diastolic CHF (congestive heart failure) (HCC)   HCAP (healthcare-associated pneumonia)   Acute metabolic encephalopathy   Macrocytic anemia   Acute respiratory failure with hypoxia (HCC)   Pressure injury of skin   Acute septic pulmonary embolism without acute cor pulmonale (HCC)   Spinal epidural abscess   Acute bacterial endocarditis   Goals of  care, counseling/discussion  Palliative care by specialist   Encounter for hospice care discussion    Discharge Instructions   Allergies as of 12/31/2018      Reactions   Chlorhexidine    Hydrochlorothiazide Other (See Comments)   REACTION: hyponatremia   Ramipril Other (See Comments)   REACTION: GI upset   Remeron [mirtazapine] Other (See Comments)   REACTION: weakness   Sulfa Antibiotics Rash      Medication List    STOP taking these medications   aspirin 325 MG tablet   atorvastatin 20 MG tablet Commonly known as: LIPITOR   BOOST BREEZE PO   carvedilol 12.5 MG tablet Commonly known as: COREG   cefTRIAXone  IVPB Commonly known as: ROCEPHIN   diclofenac sodium 1 % Gel Commonly known as: Voltaren   DULoxetine 30 MG capsule Commonly known as: CYMBALTA   feeding supplement (ENSURE ENLIVE) Liqd   HAIR SKIN AND NAILS FORMULA PO   HYDROcodone-acetaminophen 5-325 MG tablet Commonly known as: NORCO/VICODIN   levothyroxine 50 MCG tablet Commonly known as: SYNTHROID   lidocaine 5 % Commonly known as: LIDODERM   LORazepam 0.5 MG tablet Commonly known as: ATIVAN   losartan 100 MG tablet Commonly known as: COZAAR   magnesium oxide 400 MG tablet Commonly known as: MAG-OX   MELATONIN PO   pantoprazole 40 MG tablet Commonly known as: PROTONIX   polyethylene glycol 17 g packet Commonly known as: MIRALAX / GLYCOLAX   potassium chloride SA 20 MEQ tablet Commonly known as: KLOR-CON   senna-docusate 8.6-50 MG tablet Commonly known as: Senokot-S   traMADol 50 MG tablet Commonly known as: ULTRAM   vitamin B-12 500 MCG tablet Commonly known as: CYANOCOBALAMIN   VITAMIN C PO   VITAMIN D PO      Follow-up Information    Ihor Austin, NP. Go on 01/21/2019.   Specialty: Neurology Contact information: [address] [place] [address] [phone]          Allergies  Allergen Reactions   Chlorhexidine    Hydrochlorothiazide Other  (See Comments)    REACTION: hyponatremia   Ramipril Other (See Comments)    REACTION: GI upset   Remeron [Mirtazapine] Other (See Comments)    REACTION: weakness   Sulfa Antibiotics Rash    Consultations:  Neurology  ID  Palliative care  Neurosurgery   Procedures/Studies: Ct Angio Head W Or Wo Contrast  Addendum Date: 12/19/2018   ADDENDUM REPORT: 12/19/2018 21:03 ADDENDUM: Critical Value/emergent results were called by telephone at the time of interpretation on 12/19/2018 at 8:58 pm to provider Dr. Bruna Potter, Who verbally acknowledged these results. Electronically Signed   By: Rise Mu M.D.   On: 12/19/2018 21:03   Result Date: 12/19/2018 CLINICAL DATA:  Initial evaluation for acute stroke, altered mental status. EXAM: CT ANGIOGRAPHY HEAD AND NECK TECHNIQUE: Multidetector CT imaging of the head and neck was performed using the standard protocol during bolus administration of intravenous contrast. Multiplanar CT image reconstructions and MIPs were obtained to evaluate the vascular anatomy. Carotid stenosis measurements (when applicable) are obtained utilizing NASCET criteria, using the distal internal carotid diameter as the denominator. CONTRAST:  75mL OMNIPAQUE IOHEXOL 350 MG/ML SOLN COMPARISON:  Prior MRI from earlier same day as well as recent CTA from 11/20/2018. FINDINGS: CT HEAD FINDINGS Brain: Age-related cerebral atrophy with chronic small vessel ischemic disease, stable. Remote left cerebellar infarct noted. Known small acute infarcts within the left frontal white matter, bilateral thalami, and right cerebellum are not well seen. No evidence  for hemorrhagic transformation. No acute intracranial hemorrhage elsewhere. No other acute large vessel territory infarct. No mass lesion, midline shift or mass effect. No hydrocephalus. No extra-axial fluid collection. Vascular: No hyperdense vessel. Calcified atherosclerosis at the skull base. Skull: Scalp soft tissues and  calvarium within normal limits. Calvarium intact. Sinuses: Paranasal sinuses are clear. Chronic right mastoid effusion, stable. Orbits: Globes normal soft tissues demonstrate no acute finding. Review of the MIP images confirms the above findings CTA NECK FINDINGS Aortic arch: Visualized aortic arch of normal caliber with normal 3 vessel morphology. Mild-to-moderate atherosclerotic change about the aortic arch and origin of the great vessels up to approximately 50% stenosis at the proximal left subclavian artery again noted, stable. No other hemodynamically significant stenosis about the proximal great vessels. Visualized subclavian arteries otherwise widely patent. Right carotid system: Right CCA tortuous but widely patent to the bifurcation without stenosis. Mild a centric mixed plaque about the right bifurcation without hemodynamically significant stenosis. Right ICA widely patent distally to the skull base without stenosis, dissection or occlusion. Left carotid system: Left CCA tortuous but widely patent to the bifurcation without stenosis. No significant atheromatous narrowing of the left bifurcation. Left ICA tortuous with mild atheromatous irregularity but is widely patent without stenosis, dissection or occlusion. Vertebral arteries: Both vertebral arteries arise from the subclavian arteries. Left vertebral artery slightly dominant. Vertebral arteries patent within the neck without stenosis, dissection or occlusion. Skeleton: No acute osseous finding. No discrete lytic or blastic osseous lesions. Moderate to advanced cervical spondylosis at C3-4 through C6-7. Grade 1 facet mediated anterolisthesis of C7 on T1, stable. Other neck: No other acute soft tissue abnormality within the neck. Upper chest: Small focal filling defects seen within a left upper lobe segmental pulmonary artery, consistent with acute pulmonary embolism (series 9, image 160). No other visible pulmonary emboli. Moderate layering bilateral  pleural effusions with associated atelectasis. Underlying mild pulmonary interstitial edema. Review of the MIP images confirms the above findings CTA HEAD FINDINGS Anterior circulation: Petrous segments widely patent. Calcified atheromatous plaque within the carotid siphons with no more than mild multifocal stenosis. ICA termini well perfused. A1 segments patent bilaterally. Normal anterior communicating artery complex. Anterior cerebral arteries widely patent to their distal aspects. No M1 stenosis or occlusion. Negative MCA bifurcations. Distal MCA branches well perfused and symmetric. Posterior circulation: Dominant left vertebral artery demonstrates scattered atheromatous irregularity but is patent to the vertebrobasilar junction without high-grade stenosis. Patent left PICA. Hypoplastic right vertebral artery largely terminates in PICA. Right PICA patent as well. Basilar artery diffusely diminutive but patent to its distal aspect, stable. Superior cerebral arteries patent bilaterally. Predominant fetal type origin of the PCAs. Right PCA patent to its distal aspect without stenosis. Short-segment severe proximal left P2 stenosis, unchanged. Venous sinuses: Grossly patent allowing for timing the contrast bolus. Anatomic variants: Predominant fetal type origin of the PCAs with overall diminutive vertebrobasilar system. Review of the MIP images confirms the above findings IMPRESSION: 1. Stable CTA of the head and neck as compared to recent exam from 11/20/2018. No emergent large vessel occlusion. 2. Short-segment severe proximal left P2 stenosis, stable. 3. Predominant fetal type origin of the PCAs with overall diminutive vertebrobasilar system. 4. Focal filling defect within a left upper lobe segmental pulmonary artery, consistent with a small acute PE. 5. Moderate layering bilateral pleural effusions with associated atelectasis and pulmonary interstitial edema. Current attempt is being made to contact the  covering clinician regarding these findings. Results will be conveyed as soon as possible.  Electronically Signed: By: Rise Mu M.D. On: 12/19/2018 20:50   Dg Chest 1 View  Result Date: 12/25/2018 CLINICAL DATA:  Dyspnea, tachypnea; history GERD, hypertension, CHF, congestive dilated cardiomyopathy EXAM: CHEST  1 VIEW COMPARISON:  Portable exam of T5 hours compared to 12/18/2018 FINDINGS: Normal heart size, mediastinal contours, and pulmonary vascularity. Atherosclerotic calcification aorta. Patchy BILATERAL pulmonary infiltrates greatest in RIGHT upper lobe consistent with multifocal pneumonia. Question minimal bibasilar effusions. No pneumothorax. Bones demineralized. IMPRESSION: Patchy BILATERAL pulmonary infiltrates most consistent with pneumonia, greatest RIGHT upper lobe. Electronically Signed   By: Ulyses Southward M.D.   On: 12/25/2018 13:04   Ct Angio Neck W Or Wo Contrast  Addendum Date: 12/19/2018   ADDENDUM REPORT: 12/19/2018 21:03 ADDENDUM: Critical Value/emergent results were called by telephone at the time of interpretation on 12/19/2018 at 8:58 pm to provider Dr. Bruna Potter, Who verbally acknowledged these results. Electronically Signed   By: Rise Mu M.D.   On: 12/19/2018 21:03   Result Date: 12/19/2018 CLINICAL DATA:  Initial evaluation for acute stroke, altered mental status. EXAM: CT ANGIOGRAPHY HEAD AND NECK TECHNIQUE: Multidetector CT imaging of the head and neck was performed using the standard protocol during bolus administration of intravenous contrast. Multiplanar CT image reconstructions and MIPs were obtained to evaluate the vascular anatomy. Carotid stenosis measurements (when applicable) are obtained utilizing NASCET criteria, using the distal internal carotid diameter as the denominator. CONTRAST:  75mL OMNIPAQUE IOHEXOL 350 MG/ML SOLN COMPARISON:  Prior MRI from earlier same day as well as recent CTA from 11/20/2018. FINDINGS: CT HEAD FINDINGS Brain:  Age-related cerebral atrophy with chronic small vessel ischemic disease, stable. Remote left cerebellar infarct noted. Known small acute infarcts within the left frontal white matter, bilateral thalami, and right cerebellum are not well seen. No evidence for hemorrhagic transformation. No acute intracranial hemorrhage elsewhere. No other acute large vessel territory infarct. No mass lesion, midline shift or mass effect. No hydrocephalus. No extra-axial fluid collection. Vascular: No hyperdense vessel. Calcified atherosclerosis at the skull base. Skull: Scalp soft tissues and calvarium within normal limits. Calvarium intact. Sinuses: Paranasal sinuses are clear. Chronic right mastoid effusion, stable. Orbits: Globes normal soft tissues demonstrate no acute finding. Review of the MIP images confirms the above findings CTA NECK FINDINGS Aortic arch: Visualized aortic arch of normal caliber with normal 3 vessel morphology. Mild-to-moderate atherosclerotic change about the aortic arch and origin of the great vessels up to approximately 50% stenosis at the proximal left subclavian artery again noted, stable. No other hemodynamically significant stenosis about the proximal great vessels. Visualized subclavian arteries otherwise widely patent. Right carotid system: Right CCA tortuous but widely patent to the bifurcation without stenosis. Mild a centric mixed plaque about the right bifurcation without hemodynamically significant stenosis. Right ICA widely patent distally to the skull base without stenosis, dissection or occlusion. Left carotid system: Left CCA tortuous but widely patent to the bifurcation without stenosis. No significant atheromatous narrowing of the left bifurcation. Left ICA tortuous with mild atheromatous irregularity but is widely patent without stenosis, dissection or occlusion. Vertebral arteries: Both vertebral arteries arise from the subclavian arteries. Left vertebral artery slightly dominant.  Vertebral arteries patent within the neck without stenosis, dissection or occlusion. Skeleton: No acute osseous finding. No discrete lytic or blastic osseous lesions. Moderate to advanced cervical spondylosis at C3-4 through C6-7. Grade 1 facet mediated anterolisthesis of C7 on T1, stable. Other neck: No other acute soft tissue abnormality within the neck. Upper chest: Small focal filling defects seen within  a left upper lobe segmental pulmonary artery, consistent with acute pulmonary embolism (series 9, image 160). No other visible pulmonary emboli. Moderate layering bilateral pleural effusions with associated atelectasis. Underlying mild pulmonary interstitial edema. Review of the MIP images confirms the above findings CTA HEAD FINDINGS Anterior circulation: Petrous segments widely patent. Calcified atheromatous plaque within the carotid siphons with no more than mild multifocal stenosis. ICA termini well perfused. A1 segments patent bilaterally. Normal anterior communicating artery complex. Anterior cerebral arteries widely patent to their distal aspects. No M1 stenosis or occlusion. Negative MCA bifurcations. Distal MCA branches well perfused and symmetric. Posterior circulation: Dominant left vertebral artery demonstrates scattered atheromatous irregularity but is patent to the vertebrobasilar junction without high-grade stenosis. Patent left PICA. Hypoplastic right vertebral artery largely terminates in PICA. Right PICA patent as well. Basilar artery diffusely diminutive but patent to its distal aspect, stable. Superior cerebral arteries patent bilaterally. Predominant fetal type origin of the PCAs. Right PCA patent to its distal aspect without stenosis. Short-segment severe proximal left P2 stenosis, unchanged. Venous sinuses: Grossly patent allowing for timing the contrast bolus. Anatomic variants: Predominant fetal type origin of the PCAs with overall diminutive vertebrobasilar system. Review of the MIP  images confirms the above findings IMPRESSION: 1. Stable CTA of the head and neck as compared to recent exam from 11/20/2018. No emergent large vessel occlusion. 2. Short-segment severe proximal left P2 stenosis, stable. 3. Predominant fetal type origin of the PCAs with overall diminutive vertebrobasilar system. 4. Focal filling defect within a left upper lobe segmental pulmonary artery, consistent with a small acute PE. 5. Moderate layering bilateral pleural effusions with associated atelectasis and pulmonary interstitial edema. Current attempt is being made to contact the covering clinician regarding these findings. Results will be conveyed as soon as possible. Electronically Signed: By: Rise Mu M.D. On: 12/19/2018 20:50   Ct Angio Chest Pe W And/or Wo Contrast  Result Date: 12/18/2018 CLINICAL DATA:  Shortness of breath. EXAM: CT ANGIOGRAPHY CHEST WITH CONTRAST TECHNIQUE: Multidetector CT imaging of the chest was performed using the standard protocol during bolus administration of intravenous contrast. Multiplanar CT image reconstructions and MIPs were obtained to evaluate the vascular anatomy. CONTRAST:  80 mL OMNIPAQUE IOHEXOL 350 MG/ML SOLN COMPARISON:  11/17/2018 and 11/12/2018 FINDINGS: Cardiovascular: Heart is normal size. Small amount of pericardial fluid is present anteriorly. Thoracic aorta demonstrates no evidence of aneurysm or dissection. There is mild calcified plaque over the thoracic aorta. Pulmonary or system is well opacified demonstrates suggestion of small emboli over the proximal left upper lobar pulmonary artery. There is also suggestion of subtle emboli over a subsegmental right middle lobe pulmonary artery. RV/LV ratio 0.96. Remaining vascular structures are unremarkable. Mediastinum/Nodes: 1 cm precarinal lymph node is present. 1.3 cm subcarinal lymph node. No significant hilar adenopathy. Remaining mediastinal structures are unremarkable. Lungs/Pleura: Lungs are  adequately inflated demonstrate small bilateral pleural effusions with associated posterior atelectatic change. Subtle patchy density over the right upper lobe with mild nodularity as this could represent infection infection. Airways are unremarkable. Upper Abdomen: No acute findings. Mild calcified plaque over the abdominal aorta. Musculoskeletal: Degenerative change of the spine. Review of the MIP images confirms the above findings. IMPRESSION: 1. Findings suggesting subtle emboli over the proximal left upper lobe pulmonary artery and subsegmental right middle lobar pulmonary artery. RV/LV ratio slightly elevated at 0.96 indicating mild right heart strain. 2. Small bilateral pleural effusions with associated atelectasis. Mild patchy post density over the right upper lobe with mild nodularity which  may be due to infection. Minimal mediastinal adenopathy likely reactive. Recommend follow-up CT 4-6 weeks. 3.  Aortic Atherosclerosis (ICD10-I70.0). Critical Value/emergent results were called by telephone at the time of interpretation on 12/18/2018 at 2:46 pm to providerBOWIE TRAN , who verbally acknowledged these results. Electronically Signed   By: Elberta Fortisaniel  Boyle M.D.   On: 12/18/2018 14:50   Mr Brain Wo Contrast  Result Date: 12/19/2018 CLINICAL DATA:  Altered mental status unclear etiology EXAM: MRI HEAD WITHOUT CONTRAST TECHNIQUE: Multiplanar, multiecho pulse sequences of the brain and surrounding structures were obtained without intravenous contrast. COMPARISON:  MRI head 11/20/2018 FINDINGS: Brain: Small areas of acute infarct in the thalamus bilaterally have developed since the prior study. Small acute infarct left frontal white matter. Small acute infarct right cerebellum also new. Generalized atrophy with chronic microvascular ischemic change in the white matter. Chronic microhemorrhage left cerebellar tonsil and left thalamus. Negative for mass or midline shift. Vascular: Normal arterial flow voids. Skull  and upper cervical spine: No acute skeletal abnormality. C1-2 arthropathy with pannus posterior to the dens. No significant spinal stenosis. Mild anterolisthesis C2-3. Sinuses/Orbits: Paranasal sinuses clear. Right mastoid effusion. No orbital lesion. Other: None IMPRESSION: Compared with the recent MRI of 11/20/2018, new areas of acute infarct are noted in the thalamus bilaterally, left frontal white matter, and right lateral cerebellum. Electronically Signed   By: Marlan Palauharles  Clark M.D.   On: 12/19/2018 13:08   Mr Thoracic Spine Wo Contrast  Result Date: 12/19/2018 CLINICAL DATA:  Back pain.  Endocarditis. EXAM: MRI THORACIC AND LUMBAR SPINE WITHOUT CONTRAST TECHNIQUE: Multiplanar and multiecho pulse sequences of the thoracic and lumbar spine were obtained without intravenous contrast. COMPARISON:  MRI thoracic and lumbar spine 11/18/2018 FINDINGS: MRI THORACIC SPINE FINDINGS Alignment: Normal alignment. Extension weighted dorsal kyphosis T7-8. Vertebrae: Negative for fracture or mass. Scattered hemangiomata. Discogenic changes in the midthoracic spine Cord: Spinal cord signal normal throughout the thoracic spine. Cord flattening at T7-8 due to disc protrusion Paraspinal and other soft tissues: Small bilateral pleural effusions. No paraspinous mass. Disc levels: T2-3: Broad-based central disc protrusion without significant stenosis T4-5: Small right-sided disc protrusion T6-7: Moderate disc degeneration. Small left-sided disc protrusion with mild cord flattening on the left T7-8: Moderately large central disc protrusion with cord flattening unchanged from the prior study. T8-9: Small right-sided disc protrusion T9-10: Disc degeneration with right-sided spurring. Mild right foraminal stenosis. T11-12: Small left-sided disc protrusion. The patient was not able to tolerate postcontrast imaging of the thoracic or lumbar spine. MRI LUMBAR SPINE FINDINGS Segmentation:  Normal Alignment: Lumbar dextroscoliosis. Mild  retrolisthesis L1-2 and L2-3 Vertebrae: Negative for fracture. New endplate edema at U9-81-2 with associated fluid signal in the disc space. Findings are suggestive of discitis osteomyelitis given the history of endocarditis and interval development since 1 month ago. Conus medullaris and cauda equina: Conus extends to the L2 level. Conus and cauda equina appear normal. Paraspinal and other soft tissues: New fluid collection right psoas muscle at the L1-2 level measuring 10 x 15 mm. New 6 mm fluid collection left upper psoas muscle at the L2 level. Probable abscesses given the other findings. Disc levels: T12-L1: Negative L1-2: Fluid in the disc space with new endplate edema. Fluid in the disc space projects into the left epidural fluid collection most compatible with small epidural abscess. This is new. Diffuse endplate spurring with foraminal narrowing bilaterally due to spurring. L2-3: Disc degeneration and spurring. Bilateral facet degeneration. Severe subarticular stenosis on the left and mild spinal stenosis. Mild  subarticular stenosis on the right L3-4: Moderate spinal stenosis and moderate subarticular and foraminal stenosis left greater than right. Disc degeneration and spurring with moderate facet hypertrophy bilaterally L4-5: Disc degeneration with diffuse endplate spurring and bilateral facet hypertrophy. Mild spinal stenosis. Moderate subarticular bilaterally. L5-S1: Disc degeneration with endplate spurring. Bilateral facet hypertrophy. Moderate right foraminal encroachment unchanged. IMPRESSION: MR THORACIC SPINE IMPRESSION Multilevel degenerative change throughout the thoracic spine. No evidence of discitis osteomyelitis or fracture in the thoracic spine. Small bilateral pleural effusions. MR LUMBAR SPINE IMPRESSION Interval development of endplate edema at Y3-0. This is most consistent with discitis and osteomyelitis. Small left-sided epidural abscess. Bilateral small psoas abscesses have developed  since the prior MRI. Patient was not able to tolerate postcontrast imaging. Electronically Signed   By: Marlan Palau M.D.   On: 12/19/2018 13:45   Mr Lumbar Spine Wo Contrast  Result Date: 12/19/2018 CLINICAL DATA:  Back pain.  Endocarditis. EXAM: MRI THORACIC AND LUMBAR SPINE WITHOUT CONTRAST TECHNIQUE: Multiplanar and multiecho pulse sequences of the thoracic and lumbar spine were obtained without intravenous contrast. COMPARISON:  MRI thoracic and lumbar spine 11/18/2018 FINDINGS: MRI THORACIC SPINE FINDINGS Alignment: Normal alignment. Extension weighted dorsal kyphosis T7-8. Vertebrae: Negative for fracture or mass. Scattered hemangiomata. Discogenic changes in the midthoracic spine Cord: Spinal cord signal normal throughout the thoracic spine. Cord flattening at T7-8 due to disc protrusion Paraspinal and other soft tissues: Small bilateral pleural effusions. No paraspinous mass. Disc levels: T2-3: Broad-based central disc protrusion without significant stenosis T4-5: Small right-sided disc protrusion T6-7: Moderate disc degeneration. Small left-sided disc protrusion with mild cord flattening on the left T7-8: Moderately large central disc protrusion with cord flattening unchanged from the prior study. T8-9: Small right-sided disc protrusion T9-10: Disc degeneration with right-sided spurring. Mild right foraminal stenosis. T11-12: Small left-sided disc protrusion. The patient was not able to tolerate postcontrast imaging of the thoracic or lumbar spine. MRI LUMBAR SPINE FINDINGS Segmentation:  Normal Alignment: Lumbar dextroscoliosis. Mild retrolisthesis L1-2 and L2-3 Vertebrae: Negative for fracture. New endplate edema at Z6-0 with associated fluid signal in the disc space. Findings are suggestive of discitis osteomyelitis given the history of endocarditis and interval development since 1 month ago. Conus medullaris and cauda equina: Conus extends to the L2 level. Conus and cauda equina appear normal.  Paraspinal and other soft tissues: New fluid collection right psoas muscle at the L1-2 level measuring 10 x 15 mm. New 6 mm fluid collection left upper psoas muscle at the L2 level. Probable abscesses given the other findings. Disc levels: T12-L1: Negative L1-2: Fluid in the disc space with new endplate edema. Fluid in the disc space projects into the left epidural fluid collection most compatible with small epidural abscess. This is new. Diffuse endplate spurring with foraminal narrowing bilaterally due to spurring. L2-3: Disc degeneration and spurring. Bilateral facet degeneration. Severe subarticular stenosis on the left and mild spinal stenosis. Mild subarticular stenosis on the right L3-4: Moderate spinal stenosis and moderate subarticular and foraminal stenosis left greater than right. Disc degeneration and spurring with moderate facet hypertrophy bilaterally L4-5: Disc degeneration with diffuse endplate spurring and bilateral facet hypertrophy. Mild spinal stenosis. Moderate subarticular bilaterally. L5-S1: Disc degeneration with endplate spurring. Bilateral facet hypertrophy. Moderate right foraminal encroachment unchanged. IMPRESSION: MR THORACIC SPINE IMPRESSION Multilevel degenerative change throughout the thoracic spine. No evidence of discitis osteomyelitis or fracture in the thoracic spine. Small bilateral pleural effusions. MR LUMBAR SPINE IMPRESSION Interval development of endplate edema at F0-9. This is most consistent  with discitis and osteomyelitis. Small left-sided epidural abscess. Bilateral small psoas abscesses have developed since the prior MRI. Patient was not able to tolerate postcontrast imaging. Electronically Signed   By: Marlan Palau M.D.   On: 12/19/2018 13:45   Dg Chest Port 1 View  Result Date: 12/18/2018 CLINICAL DATA:  85 year old female with shortness of breath and cough. EXAM: PORTABLE CHEST 1 VIEW COMPARISON:  Chest CT dated 11/17/2018 and radiograph dated 11/12/2018  FINDINGS: Right-sided PICC with tip at the cavoatrial junction. Left lung base density, likely combination of trace pleural effusion and subsegmental atelectasis/scarring the right lung is clear. There is no pneumothorax. Stable cardiomediastinal silhouette. The aorta is tortuous. There is atherosclerotic calcification of the aorta. No acute osseous pathology. Developing infiltrate is not excluded. Clinical correlation is recommended. IMPRESSION: 1. Right-sided PICC with tip at the cavoatrial junction. No pneumothorax. 2. Left lung base density, likely combination of trace pleural effusion and atelectasis/scarring. Developing infiltrate is not excluded. Electronically Signed   By: Elgie Collard M.D.   On: 12/18/2018 11:57   Vas [place] Lower Extremity Venous (dvt)  Result Date: 12/19/2018  Lower Venous Study Indications: Pulmonary embolism.  Comparison Study: no current prior Performing Technologist: Blanch Media RVS  Examination Guidelines: A complete evaluation includes B-mode imaging, spectral Doppler, color Doppler, and power Doppler as needed of all accessible portions of each vessel. Bilateral testing is considered an integral part of a complete examination. Limited examinations for reoccurring indications may be performed as noted.  +---------+---------------+---------+-----------+----------+--------------+  RIGHT     Compressibility Phasicity Spontaneity Properties Thrombus Aging  +---------+---------------+---------+-----------+----------+--------------+  CFV       Full            Yes       Yes                                    +---------+---------------+---------+-----------+----------+--------------+  SFJ       Full                                                             +---------+---------------+---------+-----------+----------+--------------+  FV Prox   Full                                                             +---------+---------------+---------+-----------+----------+--------------+  FV  Mid    Full                                                             +---------+---------------+---------+-----------+----------+--------------+  FV Distal Full                                                             +---------+---------------+---------+-----------+----------+--------------+  PFV       Full                                                             +---------+---------------+---------+-----------+----------+--------------+  POP       Full            Yes       Yes                                    +---------+---------------+---------+-----------+----------+--------------+  PTV       Full                                                             +---------+---------------+---------+-----------+----------+--------------+  PERO                                                       Not visualized  +---------+---------------+---------+-----------+----------+--------------+   +---------+---------------+---------+-----------+----------+--------------+  LEFT      Compressibility Phasicity Spontaneity Properties Thrombus Aging  +---------+---------------+---------+-----------+----------+--------------+  CFV       Full            Yes       Yes                                    +---------+---------------+---------+-----------+----------+--------------+  SFJ       Full                                                             +---------+---------------+---------+-----------+----------+--------------+  FV Prox   Full                                                             +---------+---------------+---------+-----------+----------+--------------+  FV Mid    Full                                                             +---------+---------------+---------+-----------+----------+--------------+  FV Distal Full                                                             +---------+---------------+---------+-----------+----------+--------------+  PFV       Full                                                              +---------+---------------+---------+-----------+----------+--------------+  POP       Full            Yes       Yes                                    +---------+---------------+---------+-----------+----------+--------------+  PTV       Full                                                             +---------+---------------+---------+-----------+----------+--------------+  PERO      Full                                                             +---------+---------------+---------+-----------+----------+--------------+     Summary: Right: There is no evidence of deep vein thrombosis in the lower extremity. No cystic structure found in the popliteal fossa. Left: There is no evidence of deep vein thrombosis in the lower extremity. No cystic structure found in the popliteal fossa.  *See table(s) above for measurements and observations. Electronically signed by Sherald Hess MD on 12/19/2018 at 3:32:30 PM.    Final     Echo  1. Left ventricular ejection fraction, by visual estimation, is 55 to 60%. The left ventricle has normal function. There is mildly increased left ventricular hypertrophy. 2. Global right ventricle has normal systolic function.The right ventricular size is normal. No increase in right ventricular wall thickness. 3. Left atrium is mildly dialted. 4. The mitral valve is abnormal. Mild mitral valve regurgitation. There is moderate calcification of the mitral valve leaflets. I do not see an obvious vegetation but image qualite is poor. 5. Right atrial size was normal. 6. The aortic valve was not well visualized. Aortic valve regurgitation is not visualized. 7. The pulmonic valve was not well visualized. Pulmonic valve regurgitation is not visualized. 8. Abnormal septal motion consistent with left bundle branch block. 9. The inferior vena cava is normal in size with greater than 50% respiratory variability, suggesting right atrial pressure of 3  mmHg. 10. Limited echo with poor images. 11. Moderate calcification of the mitral valve leaflet(s). 12. The tricuspid valve is normal in structure. Tricuspid valve regurgitation is trivial.   Subjective: Sleeping  Discharge Exam: Vitals:   12/29/18 2024 12/30/18 0516  BP:  124/88  Pulse:  (!) 111  Resp:  18  Temp: 97.8 F (36.6 C) 98.2 F (36.8 C)  SpO2:  100%   Vitals:   12/29/18 1633 12/29/18 2024 12/29/18 2024 12/30/18 0516  BP:  (!) 124/49  124/88  Pulse:  100  (!) 111  Resp:  14  18  Temp: 98 F (36.7 C)  97.8 F (36.6 C) 98.2 F (36.8 C)  TempSrc: Axillary  Oral Oral  SpO2:  100%  100%  Weight:      Height:        General: Well appearing, no distress  The results of significant diagnostics from this hospitalization (including imaging, microbiology, ancillary and laboratory) are listed below for reference.     Labs: BNP (last 3 results) Recent Labs    11/12/18 0210 12/18/18 1818  BNP 65.8 291.9*   Basic Metabolic Panel: Recent Labs  Lab 12/25/18 0236 12/26/18 0317 12/27/18 0234 12/27/18 1034 12/28/18 0256 12/29/18 0256  NA 134* 136  --  141 139 142  K 4.1 4.7  --  4.4 4.6 4.6  CL 106 109  --  106 109 113*  CO2 18* 15*  --  15* 20* 17*  GLUCOSE 143* 127*  --  120* 114* 119*  BUN 37* 39*  --  36* 33* 35*  CREATININE 3.01* 2.87*  --  2.51* 2.21* 2.25*  CALCIUM 7.8* 8.1*  --  8.5* 8.2* 8.5*  MG 1.9 1.9 2.0  --  1.8 1.6*   CBC: Recent Labs  Lab 12/25/18 1148 12/26/18 0317 12/27/18 1034 12/28/18 0256 12/29/18 0256  WBC 16.7* 13.7* 18.2* 11.4* 9.9  NEUTROABS 10.8*  --   --   --   --   HGB 11.1* 9.5* 10.0* 12.0 10.0*  HCT 33.5* 29.8* 31.1* 38.5 31.8*  MCV 98.5 99.0 102.0* 102.4* 101.9*  PLT PLATELET CLUMPS NOTED ON SMEAR, COUNT APPEARS ADEQUATE 304 322 264 342   Urinalysis    Component Value Date/Time   COLORURINE YELLOW 12/18/2018 1520   APPEARANCEUR CLEAR 12/18/2018 1520   LABSPEC 1.029 12/18/2018 1520   PHURINE 7.0 12/18/2018  1520   GLUCOSEU NEGATIVE 12/18/2018 1520   HGBUR NEGATIVE 12/18/2018 1520   BILIRUBINUR NEGATIVE 12/18/2018 1520   KETONESUR NEGATIVE 12/18/2018 1520   PROTEINUR NEGATIVE 12/18/2018 1520   NITRITE NEGATIVE 12/18/2018 1520   LEUKOCYTESUR TRACE (A) 12/18/2018 1520     Time coordinating discharge: 35 minutes  SIGNED:   Jacquelin Hawkingalph Kelsy Polack, MD [facility] 12/31/2018, 9:53 AM

## 2018-12-30 NOTE — Progress Notes (Addendum)
Palliative: Autumn [name] is resting quietly in bed. She is now FULL COMFORT care and looks comfortable. There is no family at bedside at this time.   She would likely benefit from residential hospice care.   Call to son, Autumn [name].  We talked about Autumn [name] current condition, during my visit this morning.  Autumn [name] states that he had good conversation with his mother prior to this illness and feels confident that residential hospice is what she would want.    We talked about residential hospice care, what is and is not provided.  Autumn [name] states that he has friends who placed their parents in residential hospice.  He asks about eating and drinking.  I share that we care for people in hospice like toddlers, if they are eyes are closed we would not feed, but, if she is awake and wants a bite or sip, she could absolutely have it.     Choice of hospice facility offered, Autumn [name] shares that [facility] is 15 minutes from his home.  Conference with bedside nursing staff and attending related to patient condition, needs.   Autumn [name] appears stable for transfer to residential hospice.  Plan:   FULL comfort care. Requesting comfort and dignity at end of life, Residential Hospice at [facility].  Prognosis:  Days likely, less than 1 week.   57 minutes Quinn Axe, NP Palliative Medicine Team Team Phone # [phone] Greater than 50% of this time was spent counseling and coordinating care related to the above assessment and plan.

## 2018-12-30 NOTE — TOC Progression Note (Signed)
Transition of Care [facility]) - Progression Note    Patient Details  Name: Autumn [name] MRN: [id number] Date of Birth: Oct 08, 1932  Transition of Care [facility]) CM/SW [place], [place] Phone Number: 12/30/2018, 1:51 PM  Clinical Narrative:     Palliative Medicine Team have met with the patient's family. They have decided to make the patient comfort care. Referral for residential hospice has been made to [facility].   Harmon Pier, Education officer, museum from [employer] reached out to the [place]. They do not have any beds available this afternoon. Will notify us when a bed becomes available.   CSW will continue to follow.   Expected Discharge Plan: [place] Barriers to Discharge: Hospice Bed not available  Expected Discharge Plan and Services Expected Discharge Plan: [place] In-house Referral: Clinical Social Work, Hospice / Palliative Care Discharge Planning Services: CM Consult Post Acute Care Choice: Hospice Living arrangements for the past 2 months: Single Family Home, [place]                                       Social Determinants of Health (SDOH) Interventions    Readmission Risk Interventions Readmission Risk Prevention Plan 12/21/2018  Transportation Screening Complete  PCP or Specialist Appt within 3-5 Days Not Complete  Not Complete comments at this time likely plan for return to SNF  [facility] or [place] Not Complete  HRI or Home Care Consult comments at this time likely plan for return to SNF  Social Work Consult for [place] Planning/Counseling Complete  Palliative Care Screening Complete  Medication Review Press photographer) Complete  Some recent data might be hidden

## 2018-12-30 NOTE — Progress Notes (Signed)
PROGRESS NOTE    Autumn [name]  YEM:[id number] DOB: [birth year] DOA: 12/18/2018 PCP: Merlene Laughter, MD   Brief Narrative: Autumn [name] is a 85 y.o. female with history of hypertension, chronic systolic CHF, hypothyroidism, left bundle branch block, peripheral neuropathy. Patient presented secondary to altered mental status with concern for sepsis. Found to have multiple issues including PE, discitis/osteomyelitis, pneumonia, CVA.   Assessment & Plan:   Principal Problem:   Sepsis (HCC) Active Problems:   MSSA bacteremia   Acute on chronic combined systolic and diastolic CHF (congestive heart failure) (HCC)   HCAP (healthcare-associated pneumonia)   Acute metabolic encephalopathy   Macrocytic anemia   Acute respiratory failure with hypoxia (HCC)   Pressure injury of skin   Acute septic pulmonary embolism without acute cor pulmonale (HCC)   Spinal epidural abscess   Acute bacterial endocarditis  Sepsis Present on admission and likely secondary to HCAP vs discitis/osteomyelitis. Initially improved, but now meeting criteria again in setting of worsening pulmonary infiltrates.  Acute L1-L2 discitis/osteomyelitis Possible epidural abscess/bilateral psoas abscesses Neurosurgery consulted and recommended conservative management with antibiotics. ID recommended nafcillin which was switched to Ceftriaxone on 11/9. Comfort care. -Dilaudid IV prn for pain  History of MSSA bacteremia Probable mitral valve endocarditis. Repeat blood cultures negative.  Multifocal infiltrates Chest x-ray concerning for multifocal pneumonia. No procalcitonin ordered secondary to underlying systemic infection. Antibiotics switched to Zosyn to for broader coverage. Switched back to Ceftriaxone on 11/12. Transitioned to comfort care.  Acute ischemic stroke Neurology was consulted. CTA head and neck stable. LDL of 89 and hemoglobin A1C of 5.9%. Now comfort care so will discontinue Lipitor and Eliquis   Anasarca Patient's albumin is 1.6 which complicates this issue. Will be difficult to treat. Now comfort care. Discontinued Lasix.  Pulmonary emboli Small emboli of bilateral lungs with mild heart strain on imaging. Started on Eliquis which has now been discontinued since patient is not taking PO adequately and is comfort care.  AKI New with mild increase in BUN and creatinine peak of 3.27. Likely secondary to nafcillin which has been discontinued.  Chronic combined systolic and diastolic heart failure Hypervolemic from IV fluids. EF improved to 55-60%. Does not appear to have heart failure symptoms at this time. Comfort care.  Delirium Appears improved. -Ativan prn  Hypokalemia hypomagnesemia Replete as needed  Hypoalbuminemia Dietician recommendations   DVT prophylaxis: Eliquis Code Status:   Code Status: DNR Family Communication: None at bedside Disposition Plan: Discharge pending clinical improvement   Consultants:   Neurology  ID  Palliative care  Neurosurgery  Procedures:   Echo  1. Left ventricular ejection fraction, by visual estimation, is 55 to 60%. The left ventricle has normal function. There is mildly increased left ventricular hypertrophy. 2. Global right ventricle has normal systolic function.The right ventricular size is normal. No increase in right ventricular wall thickness. 3. Left atrium is mildly dialted. 4. The mitral valve is abnormal. Mild mitral valve regurgitation. There is moderate calcification of the mitral valve leaflets. I do not see an obvious vegetation but image qualite is poor. 5. Right atrial size was normal. 6. The aortic valve was not well visualized. Aortic valve regurgitation is not visualized. 7. The pulmonic valve was not well visualized. Pulmonic valve regurgitation is not visualized. 8. Abnormal septal motion consistent with left bundle branch block. 9. The inferior vena cava is normal in size with greater than  50% respiratory variability, suggesting right atrial pressure of 3 mmHg. 10. Limited echo with poor images.  11. Moderate calcification of the mitral valve leaflet(s). 12. The tricuspid valve is normal in structure. Tricuspid valve regurgitation is trivial.  Antimicrobials:  Vancomycin  Cefepime  Nafcillin  Ceftriaxone  Zosyn    Subjective: Sleeping  Objective: Vitals:   12/29/18 1633 12/29/18 2024 12/29/18 2024 12/30/18 0516  BP:  (!) 124/49  124/88  Pulse:  100  (!) 111  Resp:  14  18  Temp: 98 F (36.7 C)  97.8 F (36.6 C) 98.2 F (36.8 C)  TempSrc: Axillary  Oral Oral  SpO2:  100%  100%  Weight:      Height:        Intake/Output Summary (Last 24 hours) at 12/30/2018 1010 Last data filed at 12/30/2018 0307 Gross per 24 hour  Intake -  Output 1800 ml  Net -1800 ml   Filed Weights   12/24/18 0409 12/25/18 0330 12/26/18 0356  Weight: 72 kg 75.6 kg 74.1 kg    Examination:  General: Well appearing, no distress   Data Reviewed: I have personally reviewed following labs and imaging studies  CBC: Recent Labs  Lab 12/24/18 0252 12/25/18 1148 12/26/18 0317 12/27/18 1034 12/28/18 0256 12/29/18 0256  WBC 17.0* 16.7* 13.7* 18.2* 11.4* 9.9  NEUTROABS 13.0* 10.8*  --   --   --   --   HGB 11.1* 11.1* 9.5* 10.0* 12.0 10.0*  HCT 33.5* 33.5* 29.8* 31.1* 38.5 31.8*  MCV 96.5 98.5 99.0 102.0* 102.4* 101.9*  PLT 313 PLATELET CLUMPS NOTED ON SMEAR, COUNT APPEARS ADEQUATE 304 322 264 342   Basic Metabolic Panel: Recent Labs  Lab 12/25/18 0236 12/26/18 0317 12/27/18 0234 12/27/18 1034 12/28/18 0256 12/29/18 0256  NA 134* 136  --  141 139 142  K 4.1 4.7  --  4.4 4.6 4.6  CL 106 109  --  106 109 113*  CO2 18* 15*  --  15* 20* 17*  GLUCOSE 143* 127*  --  120* 114* 119*  BUN 37* 39*  --  36* 33* 35*  CREATININE 3.01* 2.87*  --  2.51* 2.21* 2.25*  CALCIUM 7.8* 8.1*  --  8.5* 8.2* 8.5*  MG 1.9 1.9 2.0  --  1.8 1.6*   GFR: Estimated Creatinine Clearance:  18 mL/min (A) (by C-G formula based on SCr of 2.25 mg/dL (H)). Liver Function Tests: No results for input(s): AST, ALT, ALKPHOS, BILITOT, PROT, ALBUMIN in the last 168 hours. No results for input(s): LIPASE, AMYLASE in the last 168 hours. No results for input(s): AMMONIA in the last 168 hours. Coagulation Profile: No results for input(s): INR, PROTIME in the last 168 hours. Cardiac Enzymes: No results for input(s): CKTOTAL, CKMB, CKMBINDEX, TROPONINI in the last 168 hours. BNP (last 3 results) No results for input(s): PROBNP in the last 8760 hours. HbA1C: No results for input(s): HGBA1C in the last 72 hours. CBG: No results for input(s): GLUCAP in the last 168 hours. Lipid Profile: No results for input(s): CHOL, HDL, LDLCALC, TRIG, CHOLHDL, LDLDIRECT in the last 72 hours. Thyroid Function Tests: No results for input(s): TSH, T4TOTAL, FREET4, T3FREE, THYROIDAB in the last 72 hours. Anemia Panel: No results for input(s): VITAMINB12, FOLATE, FERRITIN, TIBC, IRON, RETICCTPCT in the last 72 hours. Sepsis Labs: No results for input(s): PROCALCITON, LATICACIDVEN in the last 168 hours.  No results found for this or any previous visit (from the past 240 hour(s)).       Radiology Studies: No results found.      Scheduled Meds: . DULoxetine  60 mg Oral Daily  . glycopyrrolate  0.4 mg Intravenous BID  . LORazepam  0.25 mg Intravenous BID BM  . LORazepam  0.5 mg Intravenous QHS  . mouth rinse  15 mL Mouth Rinse BID  . sodium chloride flush  3 mL Intravenous Q12H  . traZODone  100 mg Oral QHS   Continuous Infusions: . sodium chloride 10 mL/hr at 12/26/18 0616     LOS: 12 days     Cordelia Poche, MD [facility] 12/30/2018, 10:10 AM  If 7PM-7AM, please contact night-coverage [email or web address]

## 2018-12-30 NOTE — TOC Progression Note (Signed)
Transition of Care [facility]) - Progression Note    Patient Details  Name: JOVONDA SELNER MRN: [id number] Date of Birth: Jun 20, 1932  Transition of Care [facility] CM/SW [place], [place] Phone Number: 12/30/2018, 5:33 PM  Clinical Narrative:     [facility] has a bed available and they can admit on Tuesday. CSW informed patient's son by leaving a voicemail.   CSW will continue to follow and assist with discharge planning.   Expected Discharge Plan: [place] Barriers to Discharge: Hospice Bed not available  Expected Discharge Plan and Services Expected Discharge Plan: [place] In-house Referral: Clinical Social Work, Hospice / Palliative Care Discharge Planning Services: CM Consult Post Acute Care Choice: Hospice Living arrangements for the past 2 months: Single Family Home, [place]                                       Social Determinants of Health (SDOH) Interventions    Readmission Risk Interventions Readmission Risk Prevention Plan 12/21/2018  Transportation Screening Complete  PCP or Specialist Appt within 3-5 Days Not Complete  Not Complete comments at this time likely plan for return to SNF  [facility] or [place] Not Complete  HRI or Home Care Consult comments at this time likely plan for return to SNF  Social Work Consult for [place] Planning/Counseling Complete  Palliative Care Screening Complete  Medication Review Press photographer) Complete  Some recent data might be hidden

## 2018-12-30 NOTE — Progress Notes (Signed)
Palliative Medicine RN Note: Son Autumn [name] called, concerned about how Mrs Autumn [name] is and whether or not she will get a transfer to hospice today. Autumn [name], our PA who saw her yesterday, is off service today, and Mrs Autumn [name] was seen by our NP Autumn [name].   Mrs Autumn [name] is appropriate for residential/GIP hospice, so verbal order was placed and called in to [place] with [facility] (updated SW Autumn [name]).   Notified front desk of three visitors names Autumn [name], Autumn [name], Autumn [name]); 4th undecided. Family is unsure who fourth person will be. We discussed visiting restrictions d/t COVID; Autumn [name] verbalized understanding.   Autumn [name] Autumn Rostad, RN, BSN, [facility] Palliative Medicine Team 12/30/2018 10:38 AM Office [phone]

## 2018-12-31 DIAGNOSIS — I330 Acute and subacute infective endocarditis: Secondary | ICD-10-CM

## 2018-12-31 DIAGNOSIS — Z515 Encounter for palliative care: Secondary | ICD-10-CM

## 2018-12-31 NOTE — Progress Notes (Signed)
Remainder of ativan (0.75mg ) wasted in [place] with Ginger Gleason RN as witness.

## 2018-12-31 NOTE — Progress Notes (Signed)
Palliative Medicine RN Note:  Follow up on d/c plan. Spoke with Autumn [name] with [place], and SW Autumn [name], as well as son Autumn [name]. [name] has a bed for Autumn [name] today. Autumn [name] prefers [facility] to [facility]; notified Autumn [name], and Autumn [name].  [name] [name], RN, BSN, [facility] Palliative Medicine Team 12/31/2018 8:56 AM Office [phone]

## 2018-12-31 NOTE — Care Management Important Message (Signed)
Important Message  Patient Details  Name: Autumn [name] MRN: [id number] Date of Birth: June 03, 1932   Medicare Important Message Given:  Yes     [name] 12/31/2018, 11:36 AM

## 2018-12-31 NOTE — Progress Notes (Signed)
[facility]  Notified that [facility] will have bed offer today. Family has accepted this offer. We honor his wishes and right to choose and will get good care there as well. Webb Silversmith RN

## 2018-12-31 NOTE — Progress Notes (Signed)
Nolon Stalls to be D/C'd to hospice ( [facility] ) per MD order. Report given to Clarene Critchley RN at [facility].   Pt. D/c with PIV intact. An After Visit Summary was printed and given to transport. Patient escorted via PTAR.  Jeanella Craze 12/31/2018 12:53 PM

## 2018-12-31 NOTE — TOC Transition Note (Addendum)
Transition of Care [facility]) - CM/SW Discharge Note *Discharged to [facility] via ambulance *Number for Report - [phone]   Patient Details  Name: Autumn [name] MRN: [id number] Date of Birth: 09-06-1932  Transition of Care [facility]) CM/SW Contact:  Sable Feil, LCSW Phone Number: 12/31/2018, 10:20 AM   Clinical Narrative:  CSW advised by Threasa Beards with Palliative that patient is comfort care, hospice facility recommended and family's preference is [facility]. Per [place], a bed is available today and patient can transfer to facility. Son, [name] will complete admissions paperwork and patient will be transported by PTAR.     Final next level of care: [place] Barriers to Discharge: No Barriers Identified   Patient Goals and CMS Choice Patient states their goals for this hospitalization and ongoing recovery are:: Pt will transition to comfort care CMS Medicare.gov Compare Post Acute Care list provided to:: Other (Comment Required)(Patient discharging to [place]. Son chose [facility]) Choice offered to / list presented to : (Son requested [facility])  Discharge Placement              Patient chooses bed at: [facility]) Patient to be transferred to facility by: Ambulance Name of family member notified: [name] - son Patient and family notified of of transfer: 12/31/18  Discharge Plan and Services In-house Referral: Clinical Social Work, Hospice / Palliative Care Discharge Planning Services: CM Consult Post Acute Care Choice: Hospice                              Social Determinants of Health (SDOH) Interventions  No SDOH interventions needed at discharge    Readmission Risk Interventions Readmission Risk Prevention Plan 12/21/2018  Transportation Screening Complete  PCP or Specialist Appt within 3-5 Days Not Complete  Not Complete comments at this time likely plan for return to SNF  [facility] or [place] Not Complete  HRI or Home  Care Consult comments at this time likely plan for return to SNF  Social Work Consult for [place] Planning/Counseling Complete  Palliative Care Screening Complete  Medication Review Press photographer) Complete  Some recent data might be hidden

## 2019-01-14 DEATH — deceased

## 2019-01-21 ENCOUNTER — Inpatient Hospital Stay: Payer: Self-pay | Admitting: Adult Health

## 2019-01-21 NOTE — Progress Notes (Deleted)
[facility] [address] [place].  [address] [phone]       HOSPITAL FOLLOW UP NOTE  [name] Date of Birth:  09-05-1932 Medical Record Number:  [id number]   Reason for Referral:  hospital stroke follow up    CHIEF COMPLAINT:  No chief complaint on file.   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  History obtained from *** and chart review. Reviewed all radiology images and labs personally.  Autumn [name] is a 85 y.o. female with history of peripheral neuropathy, venous insufficiency, osteopenia, left bundle branch block, hypertension and anxiety  presented on 11/12/2018 with nausea and vomiting w/ c/o's of lightheadedness, generalized weakness, SOB and neck and back pain. Admitted 11/12/2018 and found to have new onset HP w/ EF 25%, found unresponsive on bedpan with bradycardia and hypotension, severe LBP and MRSA bacteremia. CT done by NSG for waxing and waning mentation confirmed new stroke. Neuro was consulted.  Stroke work-up revealed large left cerebellar infarct embolic pattern as evidenced on MRI secondary to cardiomyopathy versus endocarditis.  CTA head/neck negative E LVO with moderate stenosis proximal left PCA P2.  2D echo EF 25% with small to moderate pericardial effusion without cardiac source of embolus.  Repeat 2D echo on 11/19/2018 EF of 55 to 60% without evidence of vegetation.  TEE showed similar EF with very small mobile echodensity attached to the posterior mitral valve leaflet measuring 3.5 x 2 mm.  LUE Doppler showed superficial left cephalic thrombosis.   Initiated aspirin 325 mg daily for secondary stroke prevention.  Treated with antibiotics for bacteremia and endocarditis.  Cardiomyopathy resolved and recommended follow-up with cardiology outpatient.   HTN stable.  LDL 89 and initiated atorvastatin 20 mg daily.  No evidence or history of DM with A1c 5.9.  Other stroke risk factors include advanced age and EtOH use but no prior history of stroke.  Other active problems include orthostasis and near syncope, severe LBP with neurosurgery following, hypothyroidism and GERD.  Residual deficits of mild dysarthria, LUE weakness/ataxia and BLE weakness likely secondary to back pain with therapies recommended discharge to SNF for ongoing therapy. She then returned on 12/18/2018 with fever of 102.4 and altered mental status found to have a PE and started on Eliquis.  MRI obtained which showed new acute infarcts bilateral thalamus, left frontal white matter and right lateral cerebellum likely secondary to MSSA endocarditis.  CTA head/neck negative E LVO with stable severe proximal L P2 stenosis, hypoplastic DB system and small acute PE without evidence of mycotic aneurysms.  Lower extremity Doppler negative for DVT.  2D echo normal EF without cardiac source of embolus identified.  Plan on completing antibiotics for sepsis, probable HCAP and recent MRSA bacteremia with endocarditis on 12/31/2018 transition to cefepime and vancomycin.  History of lower back pain with finding of L1-L2 osteomyelitis discitis and small epidural abscess without resultant spinal stenosis psoas abscess with neurosurgery consult and continue antibiotic course.  HTN stable.  Recommended continuation of atorvastatin 20 mg daily.  Other active problems include acute hypoxic respiratory failure requiring O2 supplementation, anemia of chronic disease with transfusion, generalized deconditioning, hypothyroidism and GERD.  Was found to have improvement of bilateral upper extremities with mild LUE ataxia but worsening BLE weakness likely secondary to back pain.  She was discharged to SNF for ongoing therapy.  ROS:   14 system review of systems performed and negative with exception of ***  PMH:    Past Medical History:  Diagnosis Date   Anxiety    GERD (gastroesophageal reflux disease)    Hypertension    Hypothyroid    LBBB (left bundle branch block)    MRSA bacteremia 11/19/2018   Osteopenia    Peripheral neuropathy    Sepsis (HCC) 12/2018   Venous insufficiency     PSH:  Past Surgical History:  Procedure Laterality Date   ABDOMINAL HYSTERECTOMY     BLADDER SURGERY     LEFT HEART CATHETERIZATION WITH CORONARY ANGIOGRAM N/A 09/02/2013   Procedure: LEFT HEART CATHETERIZATION WITH CORONARY ANGIOGRAM;  Surgeon: Rollene RotundaJames Hochrein, MD;  [facility];  Service: Cardiovascular;  Laterality: N/A;   RENAL ANGIOGRAM N/A 09/02/2013   Procedure: RENAL ANGIOGRAM;  Surgeon: Rollene RotundaJames Hochrein, MD;  [facility];  Service: Cardiovascular;  Laterality: N/A;   TEE WITHOUT CARDIOVERSION N/A 11/20/2018   Procedure: TRANSESOPHAGEAL ECHOCARDIOGRAM (TEE);  Surgeon: Lars MassonNelson, Katarina H, MD;  [facility];  Service: Cardiovascular;  Laterality: N/A;    Social History:  Social History   Socioeconomic History   Marital status: Widowed    Spouse name: Not on file   Number of children: 2   Years of education: Not on file   Highest education level: Not on file  Occupational History   Not on file  Social Needs   Financial resource strain: Not on file   Food insecurity    Worry: Not on file    Inability: Not on file   Transportation needs    Medical: Not on file    Non-medical: Not on file  Tobacco Use   Smoking status: Never Smoker   Smokeless tobacco: Never Used  Substance and Sexual Activity   Alcohol use: Yes    Frequency: Never    Comment: Once every three months    Drug use: No   Sexual activity: Not on file  Lifestyle   Physical activity    Days per week: Not on file    Minutes per session: Not on file   Stress: Not on file  Relationships   Social connections    Talks on phone: Not on file    Gets together: Not on file     Attends religious service: Not on file    Active member of club or organization: Not on file    Attends meetings of clubs or organizations: Not on file    Relationship status: Not on file   Intimate partner violence    Fear of current or ex partner: Not on file    Emotionally abused: Not on file    Physically abused: Not on file    Forced sexual activity: Not on file  Other Topics Concern   Not on file  Social History Narrative   Lives alone.      Family History:  Family History  Problem Relation Age of Onset   Hypertension Mother    Prostate cancer Father     Medications:   No current outpatient medications on file prior to visit.   No current facility-administered medications on file prior to visit.     Allergies:   Allergies  Allergen Reactions   Chlorhexidine    Hydrochlorothiazide Other (See Comments)    REACTION: hyponatremia   Ramipril Other (See Comments)    REACTION: GI upset   Remeron [Mirtazapine] Other (See Comments)  REACTION: weakness   Sulfa Antibiotics Rash     Physical Exam  There were no vitals filed for this visit. There is no height or weight on file to calculate BMI. No exam data present  No flowsheet data found.   General: well developed, well nourished, seated, in no evident distress Head: head normocephalic and atraumatic.   Neck: supple with no carotid or supraclavicular bruits Cardiovascular: regular rate and rhythm, no murmurs Musculoskeletal: no deformity Skin:  no rash/petichiae Vascular:  Normal pulses all extremities   Neurologic Exam Mental Status: Awake and fully alert. Oriented to place and time. Recent and remote memory intact. Attention span, concentration and fund of knowledge appropriate. Mood and affect appropriate.  Cranial Nerves: Fundoscopic exam reveals sharp disc margins. Pupils equal, briskly reactive to light. Extraocular movements full without nystagmus. Visual fields full to confrontation. Hearing  intact. Facial sensation intact. Face, tongue, palate moves normally and symmetrically.  Motor: Normal bulk and tone. Normal strength in all tested extremity muscles. Sensory.: intact to touch , pinprick , position and vibratory sensation.  Coordination: Rapid alternating movements normal in all extremities. Finger-to-nose and heel-to-shin performed accurately bilaterally. Gait and Station: Arises from chair without difficulty. Stance is normal. Gait demonstrates normal stride length and balance Reflexes: 1+ and symmetric. Toes downgoing.     NIHSS  *** Modified Rankin  *** CHA2DS2-VASc *** HAS-BLED ***   Diagnostic Data (Labs, Imaging, Testing)  CT HEAD WO CONTRAST ***  CT ANGIO HEAD W OR WO CONTRAST CT ANGIO NECK W OR WO CONTRAST ***  MR BRAIN WO CONTRAST ***  MR MRA HEAD  MR MRA NECK ***  ECHOCARDIOGRAM ***    ASSESSMENT: Autumn [name] is a 85 y.o. female with history of hypertension, chronic systolic congestive heart failure EF 25%, hypothyroidism, left bundle branch block, peripheral neuropathy, who was admitted from 11/12/2018-11/26/2018 for acute ischemic left cerebral infarct in setting of MSSA bacteremia with posterior mitral valve vegetation. To complete abx course 12/31/2018. She returned to ED 12/18/2018 with fever 102.4  and altered mental status. Found to have PE and started on eliquis. MRI revealed new acute B thalamic infarcts.     PLAN:  1. Stroke: Continue Eliquis (apixaban) daily  and atorvastatin for secondary stroke prevention. Maintain strict control of hypertension with blood pressure goal below 130/90, diabetes with hemoglobin A1c goal below 6.5% and cholesterol with LDL cholesterol (bad cholesterol) goal below 70 mg/dL.  I also advised the patient to eat a healthy diet with plenty of whole grains, cereals, fruits and vegetables, exercise regularly with at least 30 minutes of continuous activity daily and maintain ideal body weight. 2. HTN: Advised  to continue current treatment regimen.  Today's BP ***.  Advised to continue to monitor at home along with continued follow-up with PCP for management 3. HLD: Advised to continue current treatment regimen along with continued follow-up with PCP for future prescribing and monitoring of lipid panel 4.     Follow up in *** or call earlier if needed   Greater than 50% of time during this 45 minute visit was spent on counseling, explanation of diagnosis of stroke, reviewing risk factor management of HTN, HLD, MSSA endocarditis, PE, planning of further management along with potential future management, discussion regarding L1-L2 osteomyelitis discitis, and discussion with patient and family answering all questions.    Ihor Austin, AGNP-BC  [facility] [address] [place], [place] [address]  Phone [phone] Fax [phone] Note: This document was prepared  with digital dictation and possible smart phrase technology. Any transcriptional errors that result from this process are unintentional.

## 2019-01-30 NOTE — Progress Notes (Deleted)
Cardiology Office Note   Date:  01/30/2019   ID:  Tashera, Montalvo 10-09-1932, MRN [id number]  PCP:  Lajean Manes, MD  Cardiologist:   Minus Breeding, MD Referring:  ***  No chief complaint on file.     History of Present Illness: Autumn [name] is a 85 y.o. female who presents follow up of a cardiomyopathy.  She has an ejection fraction was 35%.  There was moderate mitral regurgitation.  I performed a cardiac catheterization and she had patent renal arteries although there was probably some mild fibromuscular dysplasia on the right.  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  follow-up echocardiography done recently actually demonstrated that her ejection fraction was now normal.  I have not seen her since 2016.  ***  She was admitted in earlier this year with MSSA.  She had a TEE which demonstrated small posterior leaflet MV density.  She was treated conservatively for this.  She was readmitted for sepsis.      She returns for follow-up. She's had no new shortness of breath, PND or orthopnea. She's had chronic right leg edema which is unchanged and for which we did an ultrasound in the fall. There was no evidence of DVT. Her only complaint is leg fatigue and tiredness.  Past Medical History:  Diagnosis Date  . Anxiety   . GERD (gastroesophageal reflux disease)   . Hypertension   . Hypothyroid   . LBBB (left bundle branch block)   . MRSA bacteremia 11/19/2018  . Osteopenia   . Peripheral neuropathy   . Sepsis ([place]) 12/2018  . Venous insufficiency     Past Surgical History:  Procedure Laterality Date  . ABDOMINAL HYSTERECTOMY    . BLADDER SURGERY    . LEFT HEART CATHETERIZATION WITH CORONARY ANGIOGRAM N/A 09/02/2013   Procedure: LEFT HEART CATHETERIZATION WITH CORONARY ANGIOGRAM;  Surgeon: Minus Breeding, MD;  Location: [facility] CATH LAB;  Service: Cardiovascular;  Laterality: N/A;  . RENAL ANGIOGRAM N/A  09/02/2013   Procedure: RENAL ANGIOGRAM;  Surgeon: Minus Breeding, MD;  [facility] C[facility] LAB;  Service: Cardiovascular;  Laterality: N/A;  . TEE WITHOUT CARDIOVERSION N/A 11/20/2018   Procedure: TRANSESOPHAGEAL ECHOCARDIOGRAM (TEE);  Surgeon: Dorothy Spark, MD;  Location: [facility] ENDOSCOPY;  Service: Cardiovascular;  Laterality: N/A;     No current outpatient medications on file.   No current facility-administered medications for this visit.    Allergies:   Chlorhexidine, Hydrochlorothiazide, Ramipril, Remeron [mirtazapine], and Sulfa antibiotics    Social History:  The patient  reports that she has never smoked. She has never used smokeless tobacco. She reports current alcohol use. She reports that she does not use drugs.   Family History:  The patient's ***family history includes Hypertension in her mother; Prostate cancer in her father.    ROS:  Please see the history of present illness.   Otherwise, review of systems are positive for {NONE DEFAULTED:18576::"none"}.   All other systems are reviewed and negative.    PHYSICAL EXAM: VS:  There were no vitals taken for this visit. , BMI There is no height or weight on file to calculate BMI. GENERAL:  Well appearing HEENT:  Pupils equal round and reactive, fundi not visualized, oral mucosa unremarkable NECK:  No jugular venous distention, waveform within normal limits, carotid upstroke brisk and symmetric, no bruits, no thyromegaly LYMPHATICS:  No cervical, inguinal adenopathy LUNGS:  Clear to auscultation bilaterally BACK:  No CVA tenderness CHEST:  Unremarkable HEART:  PMI not displaced or sustained,S1 and S2 within normal limits, no S3, no S4, no clicks, no rubs, *** murmurs ABD:  Flat, positive bowel sounds normal in frequency in pitch, no bruits, no rebound, no guarding, no midline pulsatile mass, no hepatomegaly, no splenomegaly EXT:  2 plus pulses throughout, no edema, no cyanosis no clubbing SKIN:  No rashes no nodules NEURO:   Cranial nerves II through XII grossly intact, motor grossly intact throughout PSYCH:  Cognitively intact, oriented to person place and time    EKG:  EKG {ACTION; IS/IS ZOX:09604540} ordered today. The ekg ordered today demonstrates ***   Recent Labs: 11/12/2018: TSH 0.778 12/18/2018: B Natriuretic Peptide 291.9 12/20/2018: ALT 28 12/29/2018: BUN 35; Creatinine, Ser 2.25; Hemoglobin 10.0; Magnesium 1.6; Platelets 342; Potassium 4.6; Sodium 142    Lipid Panel    Component Value Date/Time   CHOL 178 11/19/2018 1609   TRIG 114 11/19/2018 1609   HDL 66 11/19/2018 1609   CHOLHDL 2.7 11/19/2018 1609   VLDL 23 11/19/2018 1609   LDLCALC 89 11/19/2018 1609      Wt Readings from Last 3 Encounters:  12/26/18 163 lb 5.8 oz (74.1 kg)  11/28/18 166 lb 3.6 oz (75.4 kg)  10/02/18 150 lb (68 kg)      Other studies Reviewed: Additional studies/ records that were reviewed today include: ***. Review of the above records demonstrates:  Please see elsewhere in the note.  ***   ASSESSMENT AND PLAN:  ***   Current medicines are reviewed at length with the patient today.  The patient {ACTIONS; HAS/DOES NOT HAVE:19233} concerns regarding medicines.  The following changes have been made:  {PLAN; NO CHANGE:13088:s}  Labs/ tests ordered today include: *** No orders of the defined types were placed in this encounter.    Disposition:   FU with ***    Signed, [name], MD  01/30/2019 8:37 AM    [facility]

## 2019-01-31 ENCOUNTER — Ambulatory Visit: Payer: Medicare Other | Admitting: Cardiology

## 2019-02-17 ENCOUNTER — Encounter: Payer: Medicare Other | Admitting: Surgery
